# Patient Record
Sex: Female | Born: 1985 | Race: Black or African American | Hispanic: No | Marital: Single | Smoking: Never smoker
Health system: Southern US, Community
[De-identification: ages and names within clinical notes are randomized; demographics above are authoritative.]

## PROBLEM LIST (undated history)

## (undated) ENCOUNTER — Ambulatory Visit: Discharge status: Absent without leave | Payer: 59

## (undated) DIAGNOSIS — K219 Gastro-esophageal reflux disease without esophagitis: Secondary | ICD-10-CM

## (undated) DIAGNOSIS — Z8041 Family history of malignant neoplasm of ovary: Secondary | ICD-10-CM

## (undated) DIAGNOSIS — Z8042 Family history of malignant neoplasm of prostate: Secondary | ICD-10-CM

## (undated) DIAGNOSIS — Z8709 Personal history of other diseases of the respiratory system: Secondary | ICD-10-CM

## (undated) DIAGNOSIS — Z9189 Other specified personal risk factors, not elsewhere classified: Secondary | ICD-10-CM

## (undated) DIAGNOSIS — Z8679 Personal history of other diseases of the circulatory system: Secondary | ICD-10-CM

## (undated) DIAGNOSIS — Z889 Allergy status to unspecified drugs, medicaments and biological substances status: Secondary | ICD-10-CM

## (undated) DIAGNOSIS — C50919 Malignant neoplasm of unspecified site of unspecified female breast: Secondary | ICD-10-CM

## (undated) HISTORY — DX: Family history of malignant neoplasm of ovary: Z80.41

## (undated) HISTORY — DX: Allergy status to unspecified drugs, medicaments and biological substances: Z88.9

## (undated) HISTORY — DX: Other specified personal risk factors, not elsewhere classified: Z91.89

## (undated) HISTORY — DX: Family history of malignant neoplasm of prostate: Z80.42

## (undated) HISTORY — DX: Malignant neoplasm of unspecified site of unspecified female breast: C50.919

## (undated) HISTORY — DX: Personal history of other diseases of the respiratory system: Z87.09

## (undated) HISTORY — PX: REDUCTION MAMMAPLASTY: SUR839

## (undated) HISTORY — DX: Personal history of other diseases of the circulatory system: Z86.79

---

## 2016-10-29 DIAGNOSIS — J069 Acute upper respiratory infection, unspecified: Secondary | ICD-10-CM | POA: Diagnosis not present

## 2019-05-09 ENCOUNTER — Other Ambulatory Visit: Payer: Self-pay | Admitting: *Deleted

## 2019-05-09 DIAGNOSIS — Z20822 Contact with and (suspected) exposure to covid-19: Secondary | ICD-10-CM

## 2019-05-13 LAB — NOVEL CORONAVIRUS, NAA: SARS-CoV-2, NAA: NOT DETECTED

## 2020-05-13 ENCOUNTER — Other Ambulatory Visit: Payer: Self-pay | Admitting: Obstetrics and Gynecology

## 2020-05-13 DIAGNOSIS — R928 Other abnormal and inconclusive findings on diagnostic imaging of breast: Secondary | ICD-10-CM

## 2020-05-21 ENCOUNTER — Other Ambulatory Visit: Payer: Self-pay | Admitting: Obstetrics and Gynecology

## 2020-05-21 DIAGNOSIS — R928 Other abnormal and inconclusive findings on diagnostic imaging of breast: Secondary | ICD-10-CM

## 2020-05-29 ENCOUNTER — Ambulatory Visit
Admission: RE | Admit: 2020-05-29 | Discharge: 2020-05-29 | Disposition: A | Discharge status: Home / Self Care | Payer: 59 | Source: Ambulatory Visit | Attending: Obstetrics and Gynecology | Admitting: Obstetrics and Gynecology

## 2020-05-29 ENCOUNTER — Other Ambulatory Visit: Payer: Self-pay

## 2020-05-29 DIAGNOSIS — R928 Other abnormal and inconclusive findings on diagnostic imaging of breast: Secondary | ICD-10-CM

## 2020-05-30 ENCOUNTER — Telehealth: Payer: Self-pay | Admitting: Oncology

## 2020-05-30 ENCOUNTER — Encounter: Payer: Self-pay | Admitting: *Deleted

## 2020-05-30 NOTE — Telephone Encounter (Signed)
Spoke with patient to confirm morning [facility] appointment for 7/21, packet emailed to patient

## 2020-06-03 ENCOUNTER — Other Ambulatory Visit: Payer: Self-pay | Admitting: *Deleted

## 2020-06-03 DIAGNOSIS — Z171 Estrogen receptor negative status [ER-]: Secondary | ICD-10-CM | POA: Insufficient documentation

## 2020-06-03 DIAGNOSIS — C50412 Malignant neoplasm of upper-outer quadrant of left female breast: Secondary | ICD-10-CM | POA: Insufficient documentation

## 2020-06-03 DIAGNOSIS — Z170 Estrogen receptor positive status [ER+]: Secondary | ICD-10-CM

## 2020-06-03 NOTE — Progress Notes (Signed)
[name]  [phone] Fax:([phone]) [phone]     ID: WANETA FITTING DOB: Feb 10, 1986  MR#: [id number]  ION#:[id number]  Patient Care Team: [name], MD as PCP - General (Obstetrics and Gynecology) Rockwell Germany, RN as Oncology Nurse Navigator Mauro Kaufmann, RN as Oncology Nurse Navigator Erroll Luna, MD as Consulting Physician (General Surgery) Abdulrahim Siddiqi, Virgie Dad, MD as Consulting Physician (Oncology) Kyung Rudd, MD as Consulting Physician (Radiation Oncology) Servando Salina, MD as Consulting Physician (Obstetrics and Gynecology) Chauncey Cruel, MD OTHER MD:  CHIEF COMPLAINT: 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 She underwent bilateral diagnostic mammography with tomography and left breast ultrasonography at [facility] on 05/12/2020 showing: breast density category B; palpable 2.7 cm oval hypoechoic mass in left breast at 2-3 o'clock; 2.2 cm left axillary lymph node/grouping of nodes.  Accordingly on 05/29/2020 she proceeded to biopsy of the left breast area in question. The pathology from this procedure ([id number]) showed: invasive ductal carcinoma, grade 3. Prognostic indicators significant for: estrogen receptor, 10% positive with weak staining intensity and progesterone receptor, 0% negative. Proliferation marker Ki67 at 95%. HER2 equivocal by immunohistochemistry (2+), but negative by fluorescent in situ hybridization with a signals ratio 1.77 and number per cell 2.65.  Biopsy of the left axillary lymph node in question was negative and concordant  The patient's subsequent history is as detailed below.   INTERVAL HISTORY: [name] was evaluated in the multidisciplinary breast cancer clinic on 06/04/2020 accompanied by her mother Jeanne Evans. Her case was also presented at the  multidisciplinary breast cancer conference on the same day. At that time a preliminary plan was proposed: Neoadjuvant chemotherapy, definitive surgery to follow, adjuvant radiation if breast conservation, genetics testing   REVIEW OF SYSTEMS: On the provided questionnaire, Jeanne Evans reports sinus problems and palpable breast lump with associated pain. The patient denies unusual headaches, visual changes, nausea, vomiting, stiff neck, dizziness, or gait imbalance. There has been no cough, phlegm production, or pleurisy, no chest pain or pressure, and no change in bowel or bladder habits. The patient denies fever, rash, bleeding, unexplained fatigue or unexplained weight loss.  She exercises by walking and does about 4 miles daily.  A detailed review of systems was otherwise noncontributory   PAST MEDICAL HISTORY: Past Medical History:  Diagnosis Date  . Breast cancer ([place])   . History of asthma    has albuterol, uses PRN  . History of heart murmur in childhood   . History of multiple allergies   History of allergy related headaches   PAST SURGICAL HISTORY: History reviewed. No pertinent surgical history.  She reports having a boil lanced from her perineal area   FAMILY HISTORY: Family History  Problem Relation Age of Onset  . Lung cancer Maternal Uncle   . Prostate cancer Paternal Uncle   . Ovarian cancer Paternal Grandmother   . Prostate cancer Paternal Grandfather   The patient's father is 16 and her mother 84, as of 05/2020.  The patient has two brothers and one sister. She reports ovarian cancer in her paternal grandmother, prostate cancer in her paternal grandfather and a paternal uncle, and lung cancer in a maternal uncle.   GYNECOLOGIC HISTORY:  No LMP recorded. Menarche: 34 years old Age at first live birth: 34 years old [place] P 2 LMP current, experiences spotting Contraceptive: Mirena IUD in place HRT n/a  Hysterectomy?  no BSO? 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 She describes herself as single. She lives at home with her children, Jeanne Evans (age 83) and Jeanne Evans (age 91).     ADVANCED DIRECTIVES: Not in place. She intends to name her mother, Jeanne Evans, as her HCPOA.  Jeanne Evans lives in [place] and can be reached at [phone].  The patient was given the appropriate documents to complete and notarized at her discretion at the time of her visit 06/04/2020   HEALTH MAINTENANCE: Social History   Tobacco Use  . Smoking status: Never Smoker  . Smokeless tobacco: Never Used  Substance Use Topics  . Alcohol use: Yes  . Drug use: Yes     Colonoscopy: n/a (age)  PAP: 04/2020  Bone density: n/a (age)   Not on File  No current outpatient medications on file.   No current facility-administered medications for this visit.    OBJECTIVE: African-American woman who appears well  Vitals:   06/04/20 0905  BP: (!) 141/84  Pulse: 77  Resp: 20  Temp: 98.9 F (37.2 C)  SpO2: 100%     Body mass index is 27.72 kg/m.   Wt Readings from Last 3 Encounters:  06/04/20 159 lb (72.1 kg)      ECOG FS:1 - Symptomatic but completely ambulatory  Ocular: Sclerae unicteric, pupils round and equal Ear-nose-throat: Wearing a mask Lymphatic: No cervical or supraclavicular adenopathy Lungs no rales or rhonchi Heart regular rate and rhythm Abd soft, nontender, positive bowel sounds MSK no focal spinal tenderness, no joint edema Neuro: non-focal, well-oriented, appropriate affect Breasts: Right breast is unremarkable.  The mass in the upper outer quadrant of the left breast is easily palpable and measures clinically about 3 cm.  It is movable.  It is not tender.  There is no skin or nipple involvement.  Both axillae are benign.   LAB RESULTS:  CMP     Component Value Date/Time   NA 139 06/04/2020 0830   K 4.0 06/04/2020 0830   CL 107  06/04/2020 0830   CO2 25 06/04/2020 0830   GLUCOSE 97 06/04/2020 0830   BUN 13 06/04/2020 0830   CREATININE 0.92 06/04/2020 0830   CALCIUM 9.5 06/04/2020 0830   PROT 7.6 06/04/2020 0830   ALBUMIN 3.9 06/04/2020 0830   AST 13 (L) 06/04/2020 0830   ALT 14 06/04/2020 0830   ALKPHOS 61 06/04/2020 0830   BILITOT 0.5 06/04/2020 0830   GFRNONAA >60 06/04/2020 0830   GFRAA >60 06/04/2020 0830    No results found for: TOTALPROTELP, ALBUMINELP, A1GS, A2GS, BETS, BETA2SER, GAMS, MSPIKE, SPEI  Lab Results  Component Value Date   WBC 5.8 06/04/2020   NEUTROABS 2.6 06/04/2020   HGB 12.2 06/04/2020   HCT 36.5 06/04/2020   MCV 90.3 06/04/2020   PLT 290 06/04/2020    No results found for: LABCA2  No components found for: KKXFGH829  No results for input(s): INR in the last 168 hours.  No results found for: LABCA2  No results found for: HBZ169  No results found for: CVE938  No results found for: BOF751  No results found for: CA2729  No components found for: HGQUANT  No results found for: CEA1 / No results found for: CEA1   No results found for: AFPTUMOR  No results found for: CHROMOGRNA  No results found for: KPAFRELGTCHN, LAMBDASER, KAPLAMBRATIO (kappa/lambda light chains)  No  results found for: HGBA, HGBA2QUANT, HGBFQUANT, HGBSQUAN (Hemoglobinopathy evaluation)   No results found for: LDH  No results found for: IRON, TIBC, IRONPCTSAT (Iron and TIBC)  No results found for: FERRITIN  Urinalysis No results found for: COLORURINE, APPEARANCEUR, LABSPEC, PHURINE, GLUCOSEU, HGBUR, BILIRUBINUR, KETONESUR, PROTEINUR, UROBILINOGEN, NITRITE, LEUKOCYTESUR   STUDIES: [place] AXILLARY NODE CORE BIOPSY LEFT  Addendum Date: 05/30/2020   ADDENDUM REPORT: 05/30/2020 14:54 ADDENDUM: Pathology revealed GRADE III INVASIVE DUCTAL CARCINOMA of the LEFT breast, 2:30 o'clock. This was found to be concordant by Dr. Claudie Revering. Pathology revealed ONE LYMPH NODE NEGATIVE FOR CARCINOMA(0/1) of  the LEFT axilla. This was found to be concordant by Dr. Claudie Revering. Pathology results were discussed with the patient by telephone. The patient reported doing well after the biopsies with tenderness at the sites. Post biopsy instructions and care were reviewed and questions were answered. The patient was encouraged to call [facility] for any additional concerns. The patient was referred to [facility] on June 04, 2020. Pathology results reported by Stacie Acres RN on 05/30/2020. Electronically Signed   By: Claudie Revering M.D.   On: 05/30/2020 14:54   Result Date: 05/30/2020 CLINICAL DATA:  2.7 cm palpable mass in the 2:30 o'clock position of the left breast, 10 cm from the nipple, with recent imaging features suspicious for malignancy. The patient also had a left axillary lymph node with focal, eccentric cortical thickening that was suspicious for a metastatic node. EXAM: ULTRASOUND GUIDED LEFT BREAST CORE NEEDLE BIOPSY ULTRASOUND-GUIDED LEFT AXILLARY CORE NEEDLE BIOPSY COMPARISON:  Bilateral diagnostic mammogram and left breast ultrasound obtained at [facility] on 05/12/2020. PROCEDURE: I met with the patient and we discussed the procedure of ultrasound-guided biopsy, including benefits and alternatives. We discussed the high likelihood of a successful procedure. We discussed the risks of the procedures, including infection, bleeding, tissue injury, clip migration, and inadequate sampling. Informed written consent was given. The usual time-out protocol was performed immediately prior to the procedures. SITE 1: 0.7 CM MASS IN THE 2:30 O'CLOCK POSITION OF THE LEFT BREAST Lesion quadrant: Upper outer quadrant Using sterile technique and 1% Lidocaine as local anesthetic, under direct ultrasound visualization, a 12 gauge spring-loaded device was used to perform biopsy of the recently demonstrated 2.7 cm  mass in the 2:30 o'clock position of the left breast, 10 cm from the nipple, using a caudal approach. At the conclusion of the procedure a ribbon shaped tissue marker clip was deployed into the biopsy cavity. Follow up 2 view mammogram was performed and dictated separately. SITE 2: SUSPICIOUS LEFT AXILLARY LYMPH NODE Using sterile technique and 1% Lidocaine as local anesthetic, under direct ultrasound visualization, a 14 gauge spring-loaded device was used to perform biopsy of the recently demonstrated left axillary lymph node with focal, eccentric cortical thickening using an inferolateral approach. At the conclusion of the procedure a Q shaped tissue marker clip was deployed into the biopsy cavity. Follow up 2 view mammogram was performed and dictated separately. IMPRESSION: Ultrasound guided biopsy of the recently demonstrated 2.7 cm mass in the 2:30 o'clock position of the left breast in the recently demonstrated suspicious left axillary lymph node. No apparent complications. Electronically Signed: By: Claudie Revering M.D. On: 05/29/2020 14:33   MM CLIP PLACEMENT LEFT  Result Date: 05/29/2020 CLINICAL DATA:  Status post ultrasound-guided core needle biopsies of a 2.7 cm mass in the 2:30 o'clock position of the left breast  and suspicious left axillary lymph node. EXAM: DIAGNOSTIC LEFT MAMMOGRAM POST ULTRASOUND BIOPSY X 2 COMPARISON:  Previous exam(s). FINDINGS: Mammographic images were obtained following ultrasound guided biopsy of the recently demonstrated 2.7 cm mass in the 2:30 o'clock position of the left breast and recently demonstrated suspicious left axillary lymph node. The biopsy marking clips are in expected positions at the sites of biopsy. IMPRESSION: Appropriate positioning of the ribbon shaped biopsy marking clip at the site of biopsy in the biopsied mass in the 2:30 o'clock position of the left breast. The Q shaped biopsy marker clip is at the expected location of the biopsied left axillary lymph  node. Final Assessment: Post Procedure Mammograms for Marker Placement Electronically Signed   By: Claudie Revering M.D.   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 This was found to be concordant by Dr. Claudie Revering. Pathology revealed ONE LYMPH NODE NEGATIVE FOR CARCINOMA(0/1) of the LEFT axilla. This was found to be concordant by Dr. Claudie Revering. Pathology results were discussed with the patient by telephone. The patient reported doing well after the biopsies with tenderness at the sites. Post biopsy instructions and care were reviewed and questions were answered. The patient was encouraged to call [facility] for any additional concerns. The patient was referred to [facility] on June 04, 2020. Pathology results reported by Stacie Acres RN on 05/30/2020. Electronically Signed   By: Claudie Revering M.D.   On: 05/30/2020 14:54   Result Date: 05/30/2020 CLINICAL DATA:  2.7 cm palpable mass in the 2:30 o'clock position of the left breast, 10 cm from the nipple, with recent imaging features suspicious for malignancy. The patient also had a left axillary lymph node with focal, eccentric cortical thickening that was suspicious for a metastatic node. EXAM: ULTRASOUND GUIDED LEFT BREAST CORE NEEDLE BIOPSY ULTRASOUND-GUIDED LEFT AXILLARY CORE NEEDLE BIOPSY COMPARISON:  Bilateral diagnostic mammogram and left breast ultrasound obtained at [facility] on 05/12/2020. PROCEDURE: I met with the patient and we discussed the procedure of ultrasound-guided biopsy, including benefits and alternatives. We discussed the high likelihood of a successful procedure. We discussed the risks of the procedures, including infection,  bleeding, tissue injury, clip migration, and inadequate sampling. Informed written consent was given. The usual time-out protocol was performed immediately prior to the procedures. SITE 1: 0.7 CM MASS IN THE 2:30 O'CLOCK POSITION OF THE LEFT BREAST Lesion quadrant: Upper outer quadrant Using sterile technique and 1% Lidocaine as local anesthetic, under direct ultrasound visualization, a 12 gauge spring-loaded device was used to perform biopsy of the recently demonstrated 2.7 cm mass in the 2:30 o'clock position of the left breast, 10 cm from the nipple, using a caudal approach. At the conclusion of the procedure a ribbon shaped tissue marker clip was deployed into the biopsy cavity. Follow up 2 view mammogram was performed and dictated separately. SITE 2: SUSPICIOUS LEFT AXILLARY LYMPH NODE Using sterile technique and 1% Lidocaine as local anesthetic, under direct ultrasound visualization, a 14 gauge spring-loaded device was used to perform biopsy of the recently demonstrated left axillary lymph node with focal, eccentric cortical thickening using an inferolateral approach. At the conclusion of the procedure a Q shaped tissue marker clip was deployed  into the biopsy cavity. Follow up 2 view mammogram was performed and dictated separately. IMPRESSION: Ultrasound guided biopsy of the recently demonstrated 2.7 cm mass in the 2:30 o'clock position of the left breast in the recently demonstrated suspicious left axillary lymph node. No apparent complications. Electronically Signed: By: Claudie Revering M.D. On: 05/29/2020 14:33     ELIGIBLE FOR AVAILABLE RESEARCH PROTOCOL: no  ASSESSMENT: 34 y.o. [place] woman status post left breast upper outer quadrant biopsy 05/29/2020 for a clinical T2 N0, stage IIB invasive ductal carcinoma, functionally triple negative, with an MIB-1 of 95%.  (1) genetics testing in process  (2) neoadjuvant chemotherapy will consist of cyclophosphamide and doxorubicin in dose dense fashion  x4 followed by paclitaxel and carboplatin weekly x12  (3) definitive surgery to follow  (4) postmastectomy radiation as appropriate  PLAN: I met today with Jeanne Evans to review her new diagnosis. Specifically we discussed the biology of her breast cancer, its diagnosis, staging, treatment  options and prognosis.We first reviewed the fact that cancer is not one disease but more than 100 different diseases and that it is important to keep them separate-- otherwise when friends and relatives discuss their own cancer experiences with Brynnlee confusion can result. Similarly we explained that if breast cancer spreads to the bone or liver, the patient would not have bone cancer or liver cancer, but breast cancer in the bone and breast cancer in the liver: one cancer in three places-- not 3 different cancers which otherwise would have to be treated in 3 different ways.  We discussed the difference between local and systemic therapy. In terms of loco-regional treatment, lumpectomy plus radiation is equivalent to mastectomy as far as survival is concerned. For this reason, and because the cosmetic results are generally superior, we recommend breast conserving surgery followed by radiation.  We also noted that in terms of sequencing of treatments, whether systemic therapy or surgery is done first does not affect the ultimate outcome.  This is relevant to her case since we recommend starting with chemotherapy.  This will not only make the surgery easier and optimize the chances of retaining her breast with the best cosmetic results, but in addition we will give [place] information since if the patient obtains a complete pathologic response we will know that her long-term prognosis is good.  We then discussed the rationale for systemic therapy. There is some risk that this cancer may have already spread to other parts of her body.  We are going to obtain a chest CT scan and bone scan to show that there is no stage IV  disease.  Of course that does not mean the cancer is not microscopically present in other parts of her body.  Next we went over the options for systemic therapy which are anti-estrogens, anti-HER-2 immunotherapy, and chemotherapy. Rajah does not meet criteria for anti-HER-2 immunotherapy or antiestrogens.  Even though her cancer is technically weakly estrogen receptor positive, when we repeat testing on larger tissue samples postop these are invariable triple negative and certainly she cannot expect a significant response for antiestrogens given this profile.    Accordingly her only systemic treatment is chemotherapy.  We discussed the gold standard which is doxorubicin and cyclophosphamide given in dose dense fashion x4 followed by carboplatin and paclitaxel weekly x12.  We discussed some of the possible toxicities side effects and complications of these agents and she will also come and meet with our chemotherapy nurse for further discussion.  There is a possibility that she  may enter a permanent menopause although most women her age do retain fertility despite the chemotherapy.  We are hoping to be able to start treatment 06/17/2020.  Once she completes chemotherapy she will proceed to surgery and then radiation.  Kimberlyn has a good understanding of the overall plan. She agrees with it. She knows the goal of treatment in her case is cure. She will call with any problems that may develop before her next visit here.  Total encounter time 70 minutes.Sarajane Jews C. Kasra Melvin, MD 06/04/2020 10:56 AM Medical Oncology and Hematology [facility] [place], [place] [address] Tel. [phone]    Fax. [phone]   This document serves as a record of services personally performed by Lurline Del, MD. It was created on his behalf by Wilburn Mylar, a trained medical scribe. The creation of this record is based on the scribe's personal observations and the provider's  statements to them.   I, Lurline Del MD, have reviewed the above documentation for accuracy and completeness, and I agree with the above.    *Total Encounter Time as defined by the Centers for Medicare and Medicaid Services includes, in addition to the face-to-face time of a patient visit (documented in the note above) non-face-to-face time: obtaining and reviewing outside history, ordering and reviewing medications, tests or procedures, care coordination (communications with other health care professionals or caregivers) and documentation in the medical record.

## 2020-06-04 ENCOUNTER — Other Ambulatory Visit: Payer: Self-pay

## 2020-06-04 ENCOUNTER — Other Ambulatory Visit: Payer: Self-pay | Admitting: *Deleted

## 2020-06-04 ENCOUNTER — Inpatient Hospital Stay (HOSPITAL_BASED_OUTPATIENT_CLINIC_OR_DEPARTMENT_OTHER): Payer: 59 | Admitting: Genetic Counselor

## 2020-06-04 ENCOUNTER — Encounter: Payer: Self-pay | Admitting: Oncology

## 2020-06-04 ENCOUNTER — Encounter: Payer: Self-pay | Admitting: Genetic Counselor

## 2020-06-04 ENCOUNTER — Ambulatory Visit: Payer: 59 | Attending: Surgery | Admitting: Physical Therapy

## 2020-06-04 ENCOUNTER — Inpatient Hospital Stay: Payer: 59

## 2020-06-04 ENCOUNTER — Ambulatory Visit: Payer: Self-pay | Admitting: Surgery

## 2020-06-04 ENCOUNTER — Encounter: Payer: Self-pay | Admitting: Licensed Clinical Social Worker

## 2020-06-04 ENCOUNTER — Encounter: Payer: Self-pay | Admitting: *Deleted

## 2020-06-04 ENCOUNTER — Encounter: Payer: Self-pay | Admitting: Physical Therapy

## 2020-06-04 ENCOUNTER — Ambulatory Visit
Admission: RE | Admit: 2020-06-04 | Discharge: 2020-06-04 | Disposition: A | Discharge status: Home / Self Care | Payer: 59 | Source: Ambulatory Visit | Attending: Radiation Oncology | Admitting: Radiation Oncology

## 2020-06-04 ENCOUNTER — Ambulatory Visit: Payer: 59 | Admitting: Radiation Oncology

## 2020-06-04 ENCOUNTER — Inpatient Hospital Stay: Payer: 59 | Attending: Oncology | Admitting: Oncology

## 2020-06-04 VITALS — BP 141/84 | HR 77 | Temp 98.9°F | Resp 20 | Ht 63.5 in | Wt 159.0 lb

## 2020-06-04 DIAGNOSIS — Z793 Long term (current) use of hormonal contraceptives: Secondary | ICD-10-CM | POA: Diagnosis not present

## 2020-06-04 DIAGNOSIS — Z8042 Family history of malignant neoplasm of prostate: Secondary | ICD-10-CM | POA: Insufficient documentation

## 2020-06-04 DIAGNOSIS — Z170 Estrogen receptor positive status [ER+]: Secondary | ICD-10-CM | POA: Insufficient documentation

## 2020-06-04 DIAGNOSIS — R293 Abnormal posture: Secondary | ICD-10-CM | POA: Insufficient documentation

## 2020-06-04 DIAGNOSIS — Z8041 Family history of malignant neoplasm of ovary: Secondary | ICD-10-CM | POA: Insufficient documentation

## 2020-06-04 DIAGNOSIS — C50412 Malignant neoplasm of upper-outer quadrant of left female breast: Secondary | ICD-10-CM

## 2020-06-04 DIAGNOSIS — Z975 Presence of (intrauterine) contraceptive device: Secondary | ICD-10-CM | POA: Diagnosis not present

## 2020-06-04 DIAGNOSIS — Z171 Estrogen receptor negative status [ER-]: Secondary | ICD-10-CM | POA: Diagnosis not present

## 2020-06-04 DIAGNOSIS — Z7951 Long term (current) use of inhaled steroids: Secondary | ICD-10-CM

## 2020-06-04 DIAGNOSIS — J45909 Unspecified asthma, uncomplicated: Secondary | ICD-10-CM | POA: Diagnosis not present

## 2020-06-04 DIAGNOSIS — Z801 Family history of malignant neoplasm of trachea, bronchus and lung: Secondary | ICD-10-CM | POA: Diagnosis not present

## 2020-06-04 LAB — CMP (CANCER CENTER ONLY)
ALT: 14 U/L (ref 0–44)
AST: 13 U/L — ABNORMAL LOW (ref 15–41)
Albumin: 3.9 g/dL (ref 3.5–5.0)
Alkaline Phosphatase: 61 U/L (ref 38–126)
Anion gap: 7 (ref 5–15)
BUN: 13 mg/dL (ref 6–20)
CO2: 25 mmol/L (ref 22–32)
Calcium: 9.5 mg/dL (ref 8.9–10.3)
Chloride: 107 mmol/L (ref 98–111)
Creatinine: 0.92 mg/dL (ref 0.44–1.00)
GFR, Est AFR Am: >60 mL/min (ref >60)
GFR, Estimated: >60 mL/min (ref >60)
Glucose, Bld: 97 mg/dL (ref 70–99)
Potassium: 4 mmol/L (ref 3.5–5.1)
Sodium: 139 mmol/L (ref 135–145)
Total Bilirubin: 0.5 mg/dL (ref 0.3–1.2)
Total Protein: 7.6 g/dL (ref 6.5–8.1)

## 2020-06-04 LAB — CBC WITH DIFFERENTIAL (CANCER CENTER ONLY)
Abs Immature Granulocytes: 0.01 10*3/uL (ref 0.00–0.07)
Basophils Absolute: 0 10*3/uL (ref 0.0–0.1)
Basophils Relative: 0 %
Eosinophils Absolute: 0.2 10*3/uL (ref 0.0–0.5)
Eosinophils Relative: 4 %
HCT: 36.5 % (ref 36.0–46.0)
Hemoglobin: 12.2 g/dL (ref 12.0–15.0)
Immature Granulocytes: 0 %
Lymphocytes Relative: 42 %
Lymphs Abs: 2.5 10*3/uL (ref 0.7–4.0)
MCH: 30.2 pg (ref 26.0–34.0)
MCHC: 33.4 g/dL (ref 30.0–36.0)
MCV: 90.3 fL (ref 80.0–100.0)
Monocytes Absolute: 0.5 10*3/uL (ref 0.1–1.0)
Monocytes Relative: 9 %
Neutro Abs: 2.6 10*3/uL (ref 1.7–7.7)
Neutrophils Relative %: 45 %
Platelet Count: 290 10*3/uL (ref 150–400)
RBC: 4.04 MIL/uL (ref 3.87–5.11)
RDW: 14.4 % (ref 11.5–15.5)
WBC Count: 5.8 10*3/uL (ref 4.0–10.5)
nRBC: 0 % (ref 0.0–0.2)

## 2020-06-04 LAB — GENETIC SCREENING ORDER

## 2020-06-04 MED ORDER — LORAZEPAM 0.5 MG PO TABS: 0.5000 mg | ORAL_TABLET | Freq: Every evening | ORAL | 0 refills | Status: DC | PRN

## 2020-06-04 MED ORDER — LIDOCAINE-PRILOCAINE 2.5-2.5 % EX CREA: TOPICAL_CREAM | CUTANEOUS | 3 refills | Status: DC

## 2020-06-04 MED ORDER — DEXAMETHASONE 4 MG PO TABS: ORAL_TABLET | ORAL | 1 refills | Status: DC

## 2020-06-04 MED ORDER — PROCHLORPERAZINE MALEATE 10 MG PO TABS: 10.0000 mg | ORAL_TABLET | Freq: Four times a day (QID) | ORAL | 1 refills | Status: DC | PRN

## 2020-06-04 NOTE — Progress Notes (Signed)
Radiation Oncology         ([phone]) [phone] ________________________________  Name: Jeanne Evans        MRN: [id number]  Date of Service: 06/04/2020 DOB: 07-14-1986  LX:BWIO, Rogelio Seen, MD  Erroll Luna, MD     REFERRING PHYSICIAN: Erroll Luna, MD   DIAGNOSIS: The encounter diagnosis was Malignant neoplasm of upper-outer quadrant of left breast in female, estrogen receptor positive ([place]).   HISTORY OF PRESENT ILLNESS: Jeanne Evans is a 34 y.o. female seen in the multidisciplinary breast clinic for a new diagnosis of left breast cancer. The patient was noted to have a painful mass in the left breast. This prompted further diagnostic imaging of the breast which revealed a mass at 2:30 and this measured 2.7 x 2.2 x 2.1 cm, and an axillary node in the left axilla was enlarged measuring 2.2 x 1.5 x .7 cm. 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 Her node was negative and felt to be concordant. She is seen today to discuss options of treatment of her cancer.    PREVIOUS RADIATION THERAPY: No   PAST MEDICAL HISTORY:  Past Medical History:  Diagnosis Date  . Breast cancer ([place])   . History of asthma    has albuterol, uses PRN  . History of heart murmur in childhood   . History of multiple allergies        PAST SURGICAL HISTORY:No past surgical history on file.    FAMILY HISTORY:  Family History  Problem Relation Age of Onset  . Lung cancer Maternal Uncle   . Prostate cancer Paternal Uncle   . Ovarian cancer Paternal Grandmother   . Prostate cancer Paternal Grandfather      SOCIAL HISTORY:  reports that she has never smoked. She has never used smokeless tobacco. She reports current alcohol use. She reports current drug use. The patient is single and lives in  [place]. 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 She has teenage daughters.   ALLERGIES: Patient has no allergy information on record.   MEDICATIONS:  No current outpatient medications on file.   No current facility-administered medications for this encounter.     REVIEW OF SYSTEMS: On review of systems, the patient reports that she is doing well overall. She denies any chest pain, shortness of breath, cough, fevers, chills, night sweats, unintended weight changes. She denies any bowel or bladder disturbances, and denies abdominal pain, nausea or vomiting. She denies any new musculoskeletal or joint aches or pains. A complete review of systems is obtained and is otherwise negative.     PHYSICAL EXAM:  Wt Readings from Last 3 Encounters:  06/04/20 159 lb (72.1 kg)   Temp Readings from Last 3 Encounters:  06/04/20 98.9 F (37.2 C) (Temporal)   BP Readings from Last 3 Encounters:  06/04/20 (!) 141/84   Pulse Readings from Last 3 Encounters:  06/04/20 77    In general this is a well appearing African American female in no acute distress. She's alert and oriented x4 and appropriate throughout the examination. Cardiopulmonary assessment is negative for acute distress and she exhibits normal effort. Bilateral breast exam is deferred.    ECOG = 1  0 - Asymptomatic (Fully active, able to carry on all  predisease activities without restriction)  1 - Symptomatic but completely ambulatory (Restricted in physically strenuous activity but ambulatory and able to carry out work of a light or sedentary nature. For example, light housework, office work)  2 - Symptomatic, <50% in bed during the day (Ambulatory and capable of all self care but unable to carry out any work activities. Up and about more than 50% of waking hours)  3 - Symptomatic, >50% in bed, but not bedbound (Capable of only limited self-care, confined to bed or chair 50% or more of waking hours)  4 -  Bedbound (Completely disabled. Cannot carry on any self-care. Totally confined to bed or chair)  5 - Death   Eustace Pen MM, Creech RH, Tormey DC, et al. [phone]). "Toxicity and response criteria of the [facility] Group". [place] Oncol. 5 (6): 649-55    LABORATORY DATA:  Lab Results  Component Value Date   WBC 5.8 06/04/2020   HGB 12.2 06/04/2020   HCT 36.5 06/04/2020   MCV 90.3 06/04/2020   PLT 290 06/04/2020   Lab Results  Component Value Date   NA 139 06/04/2020   K 4.0 06/04/2020   CL 107 06/04/2020   CO2 25 06/04/2020   Lab Results  Component Value Date   ALT 14 06/04/2020   AST 13 (L) 06/04/2020   ALKPHOS 61 06/04/2020   BILITOT 0.5 06/04/2020      RADIOGRAPHY: [place] AXILLARY NODE CORE BIOPSY LEFT  Addendum Date: 05/30/2020   ADDENDUM REPORT: 05/30/2020 14:54 ADDENDUM: Pathology revealed GRADE III INVASIVE DUCTAL CARCINOMA of the LEFT breast, 2:30 o'clock. This was found to be concordant by Dr. Claudie Revering. Pathology revealed ONE LYMPH NODE NEGATIVE FOR CARCINOMA(0/1) of the LEFT axilla. This was found to be concordant by Dr. Claudie Revering. Pathology results were discussed with the patient by telephone. The patient reported doing well after the biopsies with tenderness at the sites. Post biopsy instructions and care were reviewed and questions were answered. The patient was encouraged to call [facility] for any additional concerns. The patient was referred to [facility] on June 04, 2020. Pathology results reported by Stacie Acres RN on 05/30/2020. Electronically Signed   By: Claudie Revering M.D.   On: 05/30/2020 14:54   Result Date: 05/30/2020 CLINICAL DATA:  2.7 cm palpable mass in the 2:30 o'clock position of the left breast, 10 cm from the nipple, with recent imaging features suspicious for malignancy. The patient also had a left axillary lymph node with focal,  eccentric cortical thickening that was suspicious for a metastatic node. EXAM: ULTRASOUND GUIDED LEFT BREAST CORE NEEDLE BIOPSY ULTRASOUND-GUIDED LEFT AXILLARY CORE NEEDLE BIOPSY COMPARISON:  Bilateral diagnostic mammogram and left breast ultrasound obtained at [facility] on 05/12/2020. PROCEDURE: I met with the patient and we discussed the procedure of ultrasound-guided biopsy, including benefits and alternatives. We discussed the high likelihood of a successful procedure. We discussed the risks of the procedures, including infection, bleeding, tissue injury, clip migration, and inadequate sampling. Informed written consent was given. The usual time-out protocol was performed immediately prior to the procedures. SITE 1: 0.7 CM MASS IN THE 2:30 O'CLOCK POSITION OF THE LEFT BREAST Lesion quadrant: Upper outer quadrant Using sterile technique and 1% Lidocaine as local anesthetic, under direct ultrasound visualization, a 12 gauge spring-loaded device was used to perform biopsy of the recently demonstrated 2.7 cm mass in the 2:30 o'clock position  of the left breast, 10 cm from the nipple, using a caudal approach. At the conclusion of the procedure a ribbon shaped tissue marker clip was deployed into the biopsy cavity. Follow up 2 view mammogram was performed and dictated separately. SITE 2: SUSPICIOUS LEFT AXILLARY LYMPH NODE Using sterile technique and 1% Lidocaine as local anesthetic, under direct ultrasound visualization, a 14 gauge spring-loaded device was used to perform biopsy of the recently demonstrated left axillary lymph node with focal, eccentric cortical thickening using an inferolateral approach. At the conclusion of the procedure a Q shaped tissue marker clip was deployed into the biopsy cavity. Follow up 2 view mammogram was performed and dictated separately. IMPRESSION: Ultrasound guided biopsy of the recently demonstrated 2.7 cm mass in the 2:30 o'clock position of the left breast in the  recently demonstrated suspicious left axillary lymph node. No apparent complications. Electronically Signed: By: Claudie Revering M.D. On: 05/29/2020 14:33   MM CLIP PLACEMENT LEFT  Result Date: 05/29/2020 CLINICAL DATA:  Status post ultrasound-guided core needle biopsies of a 2.7 cm mass in the 2:30 o'clock position of the left breast and suspicious left axillary lymph node. EXAM: DIAGNOSTIC LEFT MAMMOGRAM POST ULTRASOUND BIOPSY X 2 COMPARISON:  Previous exam(s). FINDINGS: Mammographic images were obtained following ultrasound guided biopsy of the recently demonstrated 2.7 cm mass in the 2:30 o'clock position of the left breast and recently demonstrated suspicious left axillary lymph node. The biopsy marking clips are in expected positions at the sites of biopsy. IMPRESSION: Appropriate positioning of the ribbon shaped biopsy marking clip at the site of biopsy in the biopsied mass in the 2:30 o'clock position of the left breast. The Q shaped biopsy marker clip is at the expected location of the biopsied left axillary lymph node. Final Assessment: Post Procedure Mammograms for Marker Placement Electronically Signed   By: Claudie Revering M.D.   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 This was found to be concordant by Dr. Claudie Revering. Pathology revealed ONE LYMPH NODE NEGATIVE FOR CARCINOMA(0/1) of the LEFT axilla. This was found to be concordant by Dr. Claudie Revering. Pathology results were discussed with the patient by telephone. The patient reported doing well after the biopsies with tenderness at the sites. Post biopsy instructions and care were reviewed and questions were answered. The patient was encouraged to call [facility] for any additional concerns. The patient was referred to [facility] on June 04, 2020. Pathology results reported by Stacie Acres RN on 05/30/2020. Electronically Signed   By: Claudie Revering M.D.   On: 05/30/2020 14:54   Result Date: 05/30/2020 CLINICAL DATA:  2.7 cm palpable mass in the 2:30 o'clock position of the left breast, 10 cm from the nipple, with recent imaging features suspicious for malignancy. The patient also had a left axillary lymph node with focal, eccentric cortical thickening that was suspicious for a metastatic node. EXAM: ULTRASOUND GUIDED LEFT BREAST CORE NEEDLE BIOPSY ULTRASOUND-GUIDED LEFT AXILLARY CORE NEEDLE BIOPSY COMPARISON:  Bilateral diagnostic mammogram and left breast ultrasound obtained at [facility] on 05/12/2020. PROCEDURE: I met with the patient and we discussed the procedure of ultrasound-guided biopsy, including benefits and alternatives. We discussed the high likelihood of a  successful procedure. We discussed the risks of the procedures, including infection, bleeding, tissue injury, clip migration, and inadequate sampling. Informed written consent was given. The usual time-out protocol was performed immediately prior to the procedures. SITE 1: 0.7 CM MASS IN THE 2:30 O'CLOCK POSITION OF THE LEFT BREAST Lesion quadrant: Upper outer quadrant Using sterile technique and 1% Lidocaine as local anesthetic, under direct ultrasound visualization, a 12 gauge spring-loaded device was used to perform biopsy of the recently demonstrated 2.7 cm mass in the 2:30 o'clock position of the left breast, 10 cm from the nipple, using a caudal approach. At the conclusion of the procedure a ribbon shaped tissue marker clip was deployed into the biopsy cavity. Follow up 2 view mammogram was performed and dictated separately. SITE 2: SUSPICIOUS LEFT AXILLARY LYMPH NODE Using sterile technique and 1% Lidocaine as local anesthetic, under direct ultrasound visualization, a 14  gauge spring-loaded device was used to perform biopsy of the recently demonstrated left axillary lymph node with focal, eccentric cortical thickening using an inferolateral approach. At the conclusion of the procedure a Q shaped tissue marker clip was deployed into the biopsy cavity. Follow up 2 view mammogram was performed and dictated separately. IMPRESSION: Ultrasound guided biopsy of the recently demonstrated 2.7 cm mass in the 2:30 o'clock position of the left breast in the recently demonstrated suspicious left axillary lymph node. No apparent complications. Electronically Signed: By: Claudie Revering M.D. On: 05/29/2020 14:33       IMPRESSION/PLAN: 1. 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 Dr. Lisbeth Renshaw discusses the pathology findings and reviews the nature of left breast disease. The consensus from the breast conference includes proceeding with neoadjuvant chemotherapy, with surgery to be determined. She is aware of the role for external radiotherapy to the breast if she had lumpectomy, or if she had node positive disease. We discussed the risks, benefits, short, and long term effects of radiotherapy, and the patient is interested in proceeding if indicated. Dr. Lisbeth Renshaw discusses the delivery and logistics of radiotherapy and anticipates a course of 6 1/2 weeks of radiotherapy with deep inspiration breath hold technique. We will see her back about 2 weeks after surgery if she chose breast conservation or if she had node positive disease to discuss the simulation process and anticipate we starting radiotherapy about 4-6 weeks after surgery.  2. Contraceptive Counseling. The patient is using a Mirena IUD for contraception and is amenorrheic. She is counseled that if she is not sexually active we would not need to perform a urine hcg, however if she is sexually active prior to radiotherapy we would want to have  a urine hcg. 3. Possible genetic predisposition to malignancy. The patient is a candidate for genetic testing given her personal  history. She was offered referral and will met with genetic testing today.   In a visit lasting 60 minutes, greater than 50% of the time was spent face to face reviewing her case, as well as in preparation of, discussing, and coordinating the patient's care.  The above documentation reflects my direct findings during this shared patient visit. Please see the separate note by Dr. Lisbeth Renshaw on this date for the remainder of the patient's plan of care.    Carola Rhine, PAC

## 2020-06-04 NOTE — Progress Notes (Signed)
Clinical Social Work [place] Psychosocial Distress Screening [place]   Patient completed distress screening protocol and scored a 4 on the Psychosocial Distress Thermometer which indicates mild distress. Clinical Social Worker met with patient and patient's Mother, [name], in [facility] to assess for distress and other psychosocial needs.  Patient stated she was feeling nervous but felt better after meeting with the treatment team and getting more information on her treatment plan.  She has been relying on her faith and trust in God to help cope through diagnosis.  CSW and patient discussed common feeling and emotions when being diagnosed with cancer, and the importance of support during treatment.    Barriers to care/review of distress screen:  - Transportation: Can drive herself and has some supports who may help as needed.   - Help at home: Lives with her two kids (De'Lisha, 13yo; De'Arah 12yo).    - Support system: Has siblings, aunts, uncles, mom, and church community who are supports for her.   - Finances: No major concerns currently. Works for [employer] and will be able to access benefits like FMLA      Distress Screen: [employer] DISTRESS SCREENING 06/04/2020  Screening Type Initial Screening  Distress experienced in past week (1-10) 4  Emotional problem type Nervousness/Anxiety  Physical Problem type Pain     Clinical Social Worker follow up needed: No.  CSW informed patient of the support team and support services at [facility].  CSW provided contact information and encouraged patient to call with any questions or concerns.   [place]

## 2020-06-04 NOTE — Progress Notes (Signed)
REFERRING PROVIDER: Chauncey Cruel, MD [address] [place],  [place] [address]  PRIMARY PROVIDER:  [name], MD  PRIMARY REASON FOR VISIT:  1. Malignant neoplasm of upper-outer quadrant of left breast in female, estrogen receptor positive ([place])   2. Family history of ovarian cancer   3. Family history of prostate cancer    HISTORY OF PRESENT ILLNESS:   Jeanne Evans, a 34 y.o. female, was seen for a [place] cancer genetics consultation at the request of Dr. Jana Hakim due to a personal history of breast cancer and a family history of ovarian and prostate cancers.  Jeanne Evans presents to clinic today to discuss the possibility of a hereditary predisposition to cancer, genetic testing, and to further clarify her future cancer risks, as well as potential cancer risks for family members.   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 The treatment plan includes neoadjuvant chemotherapy, surgery, and adjuvant radiation if breast conservation surgery performed.   RISK FACTORS:  Menarche was at age 39.  First live birth at age 12.  OCP use for approximately 10 years.  Ovaries intact: yes.  Hysterectomy: no. Menopausal status: premenopausal.  HRT use: 0 years. Colonoscopy: no; not examined. Mammogram within the last year: yes. Up to date with pelvic exams: yes. Any excessive radiation exposure in the past: no  Past Medical History:  Diagnosis Date  . Breast cancer ([place])   . Family history of ovarian cancer   . Family history of prostate cancer   . History of asthma    has albuterol, uses PRN  . History of heart murmur in childhood   . History of multiple allergies     No past surgical history on file.  Social History   Socioeconomic History  . Marital status: Unknown    Spouse name: Not on file  . Number of children: Not on file  . Years of education: Not on  file  . Highest education level: Not on file  Occupational History  . Not on file  Tobacco Use  . Smoking status: Never Smoker  . Smokeless tobacco: Never Used  Substance and Sexual Activity  . Alcohol use: Yes  . Drug use: Yes  . Sexual activity: Not on file  Other Topics Concern  . Not on file  Social History Narrative  . Not on file   Social Determinants of Health   Financial Resource Strain:   . Difficulty of Paying Living Expenses:   Food Insecurity:   . Worried About Charity fundraiser in the Last Year:   . Arboriculturist in the Last Year:   Transportation Needs:   . Film/video editor (Medical):   Marland Kitchen Lack of Transportation (Non-Medical):   Physical Activity:   . Days of Exercise per Week:   . Minutes of Exercise per Session:   Stress:   . Feeling of Stress :   Social Connections:   . Frequency of Communication with Friends and Family:   . Frequency of Social Gatherings with Friends and Family:   . Attends Religious Services:   . Active Member of Clubs or Organizations:   . Attends Archivist Meetings:   Marland Kitchen Marital Status:      FAMILY HISTORY:  We obtained a detailed, 4-generation family history.  Significant diagnoses are listed below: Family History  Problem Relation Age of Onset  . Lung cancer  Maternal Uncle        dx late 49s  . Prostate cancer Paternal Uncle        dx late 19s  . Ovarian cancer Paternal Grandmother        dx 7s  . Prostate cancer Paternal Grandfather        dx 3s  . Prostate cancer Paternal Uncle        dx early 50s        Jeanne Evans's father is living at age 76. Jeanne Evans has two paternal uncles diagnosed with prostate cancer in their late 72s and early 63s.  These maternal uncles are living at age 50 and 74.  Jeanne Evans. Beske does not believe that their prostate cancers were metastatic.  Jeanne Evans paternal grandfather was also diagnosed with prostate cancer in his 11s and passed away at 102.  Jeanne Evans paternal  grandmother was diagnosed with ovarian cancer in her 4s and passed away in her 57s.    Jeanne Evans mother is living at age 60.  Jeanne Evans has one maternal uncle who was diagnosed with lung cancer in his late 44s and passed away in his late 4s.  Jeanne Evans maternal grandfather passed away at 59 and maternal grandmother passed away at 84 from a hert attack.  No other family history of cancer was reported.   Jeanne Evans is unaware of previous family history of genetic testing for hereditary cancer risks. Patient's maternal ancestors are of [place] American and [place] ancestry, and paternal ancestors are of [place] American ancestry. There is no reported Ashkenazi Jewish ancestry. There is no known consanguinity.  GENETIC COUNSELING ASSESSMENT: Jeanne Evans is a 34 y.o. female with a personal and family history of cancer which is somewhat suggestive of a hereditary cancer syndrome and predisposition to cancer given her age of diagnosis and her family history of related cancers. We, therefore, discussed and recommended the following at today's visit.   DISCUSSION: We discussed that 5 - 10% of cancer is hereditary, with most cases of hereditary breast cancer associated with mutations in BRCA1 and BRCA2.  There are other genes that can be associated with hereditary breast cancer syndromes.  These include but are not limited to [place], ATM, and CHEK2.  We discussed that testing is beneficial for several reasons including knowing how to follow individuals after completing their treatment, identifying whether potential surgical options would be beneficial, and understand if other family members could be at risk for cancer and allow them to undergo genetic testing.   We reviewed the characteristics, features and inheritance patterns of hereditary cancer syndromes. We also discussed genetic testing, including the appropriate family members to test, the process of testing, insurance coverage and turn-around-time  for results. We discussed the implications of a negative, positive, carrier and/or variant of uncertain significant result. We recommended Jeanne Evans pursue genetic testing for a panel that includes genes associated with breast and ovarian cancers.   Jeanne Evans  was offered a common hereditary cancer panel (48 genes) and an expanded pan-cancer panel (85 genes). Jeanne Evans was informed of the benefits and limitations of each panel, including that expanded pan-cancer panels contain several preliminary evidence genes that do not have clear management guidelines at this point in time.  We also discussed that as the number of genes included on a panel increases, the chances of variants of uncertain significance increases.  After considering the benefits and limitations of each gene panel, Jeanne Evans  elected to have expanded  pan-cancer panel through [facility].  The Multi-Cancer Panel offered by [facility] includes sequencing and/or deletion duplication testing of the following 85 genes: AIP, ALK, APC, ATM, AXIN2,BAP1,  BARD1, BLM, BMPR1A, BRCA1, BRCA2, BRIP1, CASR, CDC73, CDH1, CDK4, CDKN1B, CDKN1C, CDKN2A (p14ARF), CDKN2A (p16INK4a), CEBPA, CHEK2, CTNNA1, DICER1, DIS3L2, EGFR (c.2369C>T, p.Thr790Met variant only), EPCAM (Deletion/duplication testing only), FH, FLCN, GATA2, GPC3, GREM1 (Promoter region deletion/duplication testing only), HOXB13 (c.251G>A, p.Gly84Glu), HRAS, KIT, MAX, MEN1, MET, MITF (c.952G>A, p.Glu318Lys variant only), MLH1, MSH2, MSH3, MSH6, MUTYH, NBN, NF1, NF2, NTHL1, PALB2, PDGFRA, PHOX2B, PMS2, POLD1, POLE, POT1, PRKAR1A, PTCH1, PTEN, RAD50, RAD51C, RAD51D, RB1, RECQL4, RET, RNF43, RUNX1, SDHAF2, SDHA (sequence changes only), SDHB, SDHC, SDHD, SMAD4, SMARCA4, SMARCB1, SMARCE1, STK11, SUFU, TERC, TERT, TMEM127, TP53, TSC1, TSC2, VHL, WRN and WT1.   Based on Jeanne Evans's personal and family history of cancer, she meets medical criteria for genetic testing. Despite that she meets criteria, she may  still have an out of pocket cost. We discussed that if her out of pocket cost for testing is over $100, the laboratory will call and confirm whether she wants to proceed with testing.  If the out of pocket cost of testing is less than $100 she will be billed by the genetic testing laboratory.   PLAN: After considering the risks, benefits, and limitations, Jeanne Evans provided informed consent to pursue genetic testing and the blood sample was sent to [facility] for analysis of the Multi-Cancer Panel. Results should be available within approximately 3 weeks' time, at which point they will be disclosed by telephone to Jeanne Evans, as will any additional recommendations warranted by these results. Jeanne Evans. [place] will receive a summary of her genetic counseling visit and a copy of her results once available. This information will also be available in Epic.   Lastly, we encouraged Jeanne Evans. [place] to remain in contact with cancer genetics annually so that we can continuously update the family history and inform her of any changes in cancer genetics and testing that may be of benefit for this family.   Jeanne Evans. [place] questions were answered to her satisfaction today. Our contact information was provided should additional questions or concerns arise. Thank you for the referral and allowing [place] to share in the care of your patient.   Elijha Dedman M. Joette Catching, [email or web address] (P) [phone]  The patient was seen for a total of 25 minutes in face-to-face genetic counseling.  This patient was discussed with Drs. Magrinat, Lindi Adie and/or Burr Medico who agrees with the above.   _______________________________________________________________________ For Office Staff:  Number of people involved in session: 1 Was an Intern/ student involved with case: no

## 2020-06-04 NOTE — Patient Instructions (Signed)

## 2020-06-04 NOTE — H&P (View-Only) (Signed)
[name] Documented: 06/04/2020 8:56 AM Location: [facility] Patient #: [id number] DOB: [birth year] Undefined / Language: [name] / Race: Black or African American Female  History of Present Illness [name] A. Chelisa Hennen MD; 06/04/2020 10:21 AM) Patient words: Pt sent at the request of Dr Lisbeth Renshaw for left breast mass that is painful for 1 month. Mammogram/ U/S shows 2.7 cm UOQ left breast mass ER weak positive PR negative her 2 neu negative ki 67 95% No family history of breast cancer No other complaints. Pain is worse after biopsy.  The patient is a 34 year old female.   Allergies [name], [place]; 06/04/2020 8:59 AM) No Known Drug Allergies [06/02/2020]: Allergies Reconciled  Medication History [name], [place]; 06/04/2020 8:59 AM) Albuterol Sulfate HFA (108 (90 Base)MCG/ACT Aerosol Soln, Inhalation) Active. No Current Medications (Taken starting 06/04/2020) Amoxicillin (500MG  Capsule, Oral) Active. metroNIDAZOLE (500MG  Tablet, Oral) Active. Ondansetron (4MG  Tablet Disint, Oral) Active. valACYclovir HCl (500MG  Tablet, Oral) Active. valACYclovir HCl (1GM Tablet, Oral) Active. predniSONE (20MG  Tablet, Oral) Active.     Review of Systems (Mekhi Sonn A. Denaisha Swango MD; 06/04/2020 10:21 AM) All other systems negative   Physical Exam (Jacobe Study A. Nakiyah Beverley MD; 06/04/2020 10:22 AM)  Head and Neck Head-normocephalic, atraumatic with no lesions or palpable masses.  Eye Eyeball - Bilateral-Extraocular movements intact. Sclera/Conjunctiva - Bilateral-No scleral icterus.  Chest and Lung Exam Chest and lung exam reveals -quiet, even and easy respiratory effort with no use of accessory muscles and on auscultation, normal breath sounds, no adventitious sounds and normal vocal resonance. Inspection Chest Wall - Normal. Back - normal.  Breast Note: left breast mass 3 cm mobile tender LUOQ right breast normal  Cardiovascular Cardiovascular examination  reveals -on palpation PMI is normal in location and amplitude, no palpable S3 or S4. Normal cardiac borders., normal heart sounds, regular rate and rhythm with no murmurs, carotid auscultation reveals no bruits and normal pedal pulses bilaterally.  Neurologic Neurologic evaluation reveals -alert and oriented x 3 with no impairment of recent or remote memory. Mental Status-Normal.  Musculoskeletal Normal Exam - Left-Upper Extremity Strength Normal and Lower Extremity Strength Normal. Normal Exam - Right-Upper Extremity Strength Normal, Lower Extremity Weakness.    Assessment & Plan (Sibyl Mikula A. Zoeann Mol MD; 06/04/2020 10:27 AM)  BREAST CANCER, STAGE 2, LEFT (C50.912) Impression: neoadjuvant chemotherapy breast conservation down the road MRI schedule for port Pt requires port placement for chemotherapy. Risk include bleeding, infection, pneumothorax, hemothorax, mediastinal injury, nerve injury , blood vessel injury, stroke, blood clots, death, migration. embolization and need for additional procedures. Pt agrees to proceed.  Current Plans You are being scheduled for surgery- Our schedulers will call you.  You should hear from our office's scheduling department within 5 working days about the location, date, and time of surgery. We try to make accommodations for patient's preferences in scheduling surgery, but sometimes the OR schedule or the surgeon's schedule prevents [place] from making those accommodations.  If you have not heard from our office [phone]) in 5 working days, call the office and ask for your surgeon's nurse.  If you have other questions about your diagnosis, plan, or surgery, call the office and ask for your surgeon's nurse.  Use of a central venous catheter for intravenous therapy was discussed. Technique of catheter placement using ultrasound and fluoroscopy guidance was discussed. Risks such as bleeding, infection, pneumothorax, catheter occlusion,  reoperation, and other risks were discussed. I noted a good likelihood this will help address the problem. Questions were answered. The patient  expressed understanding & wishes to proceed.

## 2020-06-04 NOTE — Therapy (Signed)
[place], [place], [address] Phone: [phone]   Fax:  [phone]  Physical Therapy Evaluation  Patient Details  Name: Jeanne Evans MRN: [id number] Date of Birth: 07/07/1986 Referring Provider (PT): Dr. Erroll Luna   Encounter Date: 06/04/2020   PT End of Session - 06/04/20 1254    Visit Number 1    Number of Visits 2    Date for PT Re-Evaluation 07/30/20    PT Start Time 1125    PT Stop Time 1155    PT Time Calculation (min) 30 min    Activity Tolerance Patient tolerated treatment well    Behavior During Therapy [facility] for tasks assessed/performed           Past Medical History:  Diagnosis Date  . Breast cancer ([place])   . History of asthma    has albuterol, uses PRN  . History of heart murmur in childhood   . History of multiple allergies     History reviewed. No pertinent surgical history.  There were no vitals filed for this visit.    Subjective Assessment - 06/04/20 1243    Subjective Patient reports she is here today to be seen by her medical team for her newly diagnosed left breast cancer.    Patient is accompained by: Family member    Pertinent History Patient was diagnosed on 05/12/2020 with left breast cancer. It measures 2.7 cm and is located in hte upper outer quadrant. 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    Patient Stated Goals Reduce lymphedema risk and learn post op shoulder ROM HEP    Currently in Pain? No/denies              [facility] PT Assessment - 06/04/20 0001      Assessment   Medical Diagnosis Left breast cancer    Referring Provider (PT) Dr. [name] [place]    Onset Date/Surgical Date 05/12/20    Hand Dominance Right    Prior Therapy none      Precautions   Precautions Other (comment)    Precaution Comments active cancer      Restrictions   Weight Bearing Restrictions No      Balance Screen   Has the patient fallen in the  past 6 months No    Has the patient had a decrease in activity level because of a fear of falling?  No    Is the patient reluctant to leave their home because of a fear of falling?  No      Home Environment   Living Environment Private residence    Living Arrangements Children   19 and 8 y.o. daughters   Available Help at Discharge Family   Her Mom is going to stay with her during treatment     Prior Function   Level of Independence Independent    Vocation Full time employment    Vocation Requirements [employer] [place] She walks twice a day 5x/week for 30 min each time      Cognition   Overall Cognitive Status Within Functional Limits for tasks assessed      Posture/Postural Control   Posture/Postural Control Postural limitations    Postural Limitations Rounded Shoulders;Forward head;Increased lumbar lordosis      ROM / Strength   AROM / PROM / Strength AROM;Strength      AROM   Overall AROM Comments Cervical AROM is WNL  AROM Assessment Site Shoulder    Right/Left Shoulder Right;Left    Right Shoulder Extension 56 Degrees    Right Shoulder Flexion 147 Degrees    Right Shoulder ABduction 159 Degrees    Right Shoulder Internal Rotation 60 Degrees    Right Shoulder External Rotation 78 Degrees    Left Shoulder Extension 55 Degrees    Left Shoulder Flexion 138 Degrees    Left Shoulder ABduction 163 Degrees    Left Shoulder Internal Rotation 56 Degrees    Left Shoulder External Rotation 78 Degrees      Strength   Overall Strength Within functional limits for tasks performed             LYMPHEDEMA/ONCOLOGY QUESTIONNAIRE - 06/04/20 0001      Type   Cancer Type Left breast cancer      Lymphedema Assessments   Lymphedema Assessments Upper extremities      Right Upper Extremity Lymphedema   10 cm Proximal to Olecranon Process 33 cm    Olecranon Process 26.5 cm    10 cm Proximal to Ulnar Styloid Process 22.6 cm    Just Proximal to Ulnar  Styloid Process 16 cm    Across Hand at [employer] 19 cm    At [place] of 2nd Digit 6.4 cm      Left Upper Extremity Lymphedema   10 cm Proximal to Olecranon Process 32.8 cm    Olecranon Process 25.8 cm    10 cm Proximal to Ulnar Styloid Process 21.6 cm    Just Proximal to Ulnar Styloid Process 15.7 cm    Across Hand at [employer] 19.3 cm    At [place] of 2nd Digit 6.1 cm           L-DEX FLOWSHEETS - 06/04/20 1200      L-DEX LYMPHEDEMA SCREENING   Measurement Type Unilateral    L-DEX MEASUREMENT EXTREMITY Upper Extremity    POSITION  Standing    DOMINANT SIDE Right    At Risk Side Left    BASELINE SCORE (UNILATERAL) -1.2                Quick Dash - 06/04/20 0001    Open a tight or new jar Mild difficulty    Do heavy household chores (wash walls, wash floors) Mild difficulty    Carry a shopping bag or briefcase No difficulty    Wash your back Mild difficulty    Use a knife to cut food No difficulty    Recreational activities in which you take some force or impact through your arm, shoulder, or hand (golf, hammering, tennis) Mild difficulty    During the past week, to what extent has your arm, shoulder or hand problem interfered with your normal social activities with family, friends, neighbors, or groups? Slightly    During the past week, to what extent has your arm, shoulder or hand problem limited your work or other regular daily activities Slightly    Arm, shoulder, or hand pain. None    Tingling (pins and needles) in your arm, shoulder, or hand Mild    Difficulty Sleeping Mild difficulty    DASH Score 18.18 %            Objective measurements completed on examination: See above findings.       Patient was instructed today in a home exercise program today for post op shoulder range of motion. These included active assist shoulder flexion in sitting, scapular retraction, wall  walking with shoulder abduction, and hands behind head external rotation.  She  was encouraged to do these twice a day, holding 3 seconds and repeating 5 times when permitted by her physician.            PT Education - 06/04/20 1253    Education Details Lymphedema risk reduction and post op shoulder ROM HEP    Person(s) Educated Patient;Parent(s)    Methods Explanation;Demonstration;Handout    Comprehension Returned demonstration;Verbalized understanding               PT Long Term Goals - 06/04/20 1258      PT LONG TERM GOAL #1   Title Patient will demonstrate she has regained full shoulder ROM and function post operatively compared to baselines.    Time 6    Period Months    Status New    Target Date 12/05/20           Breast Clinic Goals - 06/04/20 1258      Patient will be able to verbalize understanding of pertinent lymphedema risk reduction practices relevant to her diagnosis specifically related to skin care.   Time 1    Period Days    Status Achieved      Patient will be able to return demonstrate and/or verbalize understanding of the post-op home exercise program related to regaining shoulder range of motion.   Time 1    Status Achieved      Patient will be able to verbalize understanding of the importance of attending the postoperative After Breast Cancer Class for further lymphedema risk reduction education and therapeutic exercise.   Time 1    Period Days    Status Achieved                 Plan - 06/04/20 1254    Clinical Impression Statement Patient was diagnosed on 05/12/2020 with left breast cancer. It measures 2.7 cm and is located in hte upper outer quadrant. 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 Her multidisciplinary medical team met prior to her assessments to determine a recommended treatment plan. She is planning to have neoadjuvant chemotherapy followed by a left lumpectomy, radiation, and anti-estrogen therapy. She will benefit from a post op PT reassessment to determine needs and from L-Dex  screens every 3 months for 2 years.    Stability/Clinical Decision Making Stable/Uncomplicated    Clinical Decision Making Low    Rehab Potential Excellent    PT Frequency --   Eval and 1 f/u visit followed by L-Dex screens every 3 months for 2 years   PT Treatment/Interventions ADLs/Self Care Home Management;Therapeutic exercise;Patient/family education    PT Next Visit Plan Will reassess 3-4 weeks post op to determine needs    PT Home Exercise Plan Post op shoulder ROM HEP    Consulted and Agree with Plan of Care Patient;Family member/caregiver    Family Member Consulted Mother           Patient will benefit from skilled therapeutic intervention in order to improve the following deficits and impairments:  Postural dysfunction, Decreased range of motion, Impaired UE functional use, Pain, Decreased knowledge of precautions  Visit Diagnosis: Malignant neoplasm of upper-outer quadrant of left breast in female, estrogen receptor positive ([place]) - Plan: PT plan of care cert/re-cert  Abnormal posture - Plan: PT plan of care cert/re-cert   Patient will follow up at outpatient cancer rehab 3-4 weeks following surgery.  If the patient requires physical therapy  at that time, a specific plan will be dictated and sent to the referring physician for approval. The patient was educated today on appropriate basic range of motion exercises to begin post operatively and the importance of attending the After Breast Cancer class following surgery.  Patient was educated today on lymphedema risk reduction practices as it pertains to recommendations that will benefit the patient immediately following surgery.  She verbalized good understanding.      Problem List Patient Active Problem List   Diagnosis Date Noted  . Malignant neoplasm of upper-outer quadrant of left breast in female, estrogen receptor positive ([place]) 06/03/2020   Annia Friendly, PT 06/04/20 1:10 PM  [place], [place], [address] Phone: [phone]   Fax:  [phone]  Name: GIOVANNA KEMMERER MRN: [id number] Date of Birth: Jul 18, 1986

## 2020-06-04 NOTE — Progress Notes (Signed)
START ON PATHWAY REGIMEN - Breast     A cycle is every 14 days (cycles 1-4):     Doxorubicin      Cyclophosphamide      Pegfilgrastim-xxxx    A cycle is every 21 days (cycles 5-8):     Paclitaxel      Carboplatin   **Always confirm dose/schedule in your pharmacy ordering system**  Patient Characteristics: Preoperative or Nonsurgical Candidate (Clinical Staging), Neoadjuvant Therapy followed by Surgery, Invasive Disease, Chemotherapy, HER2 Negative/Unknown/Equivocal, ER Negative/Unknown, Platinum Therapy Indicated Therapeutic Status: Preoperative or Nonsurgical Candidate (Clinical Staging) AJCC M Category: cM0 AJCC Grade: G3 Breast Surgical Plan: Neoadjuvant Therapy followed by Surgery ER Status: Negative (-) AJCC 8 Stage Grouping: IIB HER2 Status: Negative (-) AJCC T Category: cT2 AJCC N Category: cN0 PR Status: Negative (-) Type of Therapy: Platinum Therapy Indicated Intent of Therapy: Curative Intent, Discussed with Patient

## 2020-06-04 NOTE — H&P (Signed)
Jeanne Evans Documented: 06/04/2020 8:56 AM Location: [facility] Patient #: [id number] DOB: 1986/02/22 Undefined / Language: [name] / Race: Black or African American Female  History of Present Illness [name] A. Darlette Dubow MD; 06/04/2020 10:21 AM) Patient words: Pt sent at the request of Dr Lisbeth Renshaw for left breast mass that is painful for 1 month. Mammogram/ U/S shows 2.7 cm UOQ left breast mass ER weak positive PR negative her 2 neu negative ki 67 95% No family history of breast cancer No other complaints. Pain is worse after biopsy.  The patient is a 34 year old female.   Allergies [name], [place]; 06/04/2020 8:59 AM) No Known Drug Allergies [06/02/2020]: Allergies Reconciled  Medication History [name], [place]; 06/04/2020 8:59 AM) Albuterol Sulfate HFA (108 (90 Base)MCG/ACT Aerosol Soln, Inhalation) Active. No Current Medications (Taken starting 06/04/2020) Amoxicillin (500MG  Capsule, Oral) Active. metroNIDAZOLE (500MG  Tablet, Oral) Active. Ondansetron (4MG  Tablet Disint, Oral) Active. valACYclovir HCl (500MG  Tablet, Oral) Active. valACYclovir HCl (1GM Tablet, Oral) Active. predniSONE (20MG  Tablet, Oral) Active.     Review of Systems (Emmilee Reamer A. Dayne Chait MD; 06/04/2020 10:21 AM) All other systems negative   Physical Exam (Shaquoya Cosper A. Rainy Rothman MD; 06/04/2020 10:22 AM)  Head and Neck Head-normocephalic, atraumatic with no lesions or palpable masses.  Eye Eyeball - Bilateral-Extraocular movements intact. Sclera/Conjunctiva - Bilateral-No scleral icterus.  Chest and Lung Exam Chest and lung exam reveals -quiet, even and easy respiratory effort with no use of accessory muscles and on auscultation, normal breath sounds, no adventitious sounds and normal vocal resonance. Inspection Chest Wall - Normal. Back - normal.  Breast Note: left breast mass 3 cm mobile tender LUOQ right breast normal  Cardiovascular Cardiovascular examination  reveals -on palpation PMI is normal in location and amplitude, no palpable S3 or S4. Normal cardiac borders., normal heart sounds, regular rate and rhythm with no murmurs, carotid auscultation reveals no bruits and normal pedal pulses bilaterally.  Neurologic Neurologic evaluation reveals -alert and oriented x 3 with no impairment of recent or remote memory. Mental Status-Normal.  Musculoskeletal Normal Exam - Left-Upper Extremity Strength Normal and Lower Extremity Strength Normal. Normal Exam - Right-Upper Extremity Strength Normal, Lower Extremity Weakness.    Assessment & Plan (Tye Vigo A. Neko Mcgeehan MD; 06/04/2020 10:27 AM)  BREAST CANCER, STAGE 2, LEFT (C50.912) Impression: neoadjuvant chemotherapy breast conservation down the road MRI schedule for port Pt requires port placement for chemotherapy. Risk include bleeding, infection, pneumothorax, hemothorax, mediastinal injury, nerve injury , blood vessel injury, stroke, blood clots, death, migration. embolization and need for additional procedures. Pt agrees to proceed.  Current Plans You are being scheduled for surgery- Our schedulers will call you.  You should hear from our office's scheduling department within 5 working days about the location, date, and time of surgery. We try to make accommodations for patient's preferences in scheduling surgery, but sometimes the OR schedule or the surgeon's schedule prevents [place] from making those accommodations.  If you have not heard from our office [phone]) in 5 working days, call the office and ask for your surgeon's nurse.  If you have other questions about your diagnosis, plan, or surgery, call the office and ask for your surgeon's nurse.  Use of a central venous catheter for intravenous therapy was discussed. Technique of catheter placement using ultrasound and fluoroscopy guidance was discussed. Risks such as bleeding, infection, pneumothorax, catheter occlusion,  reoperation, and other risks were discussed. I noted a good likelihood this will help address the problem. Questions were answered. The patient  expressed understanding & wishes to proceed.

## 2020-06-05 ENCOUNTER — Telehealth: Payer: Self-pay | Admitting: Oncology

## 2020-06-05 NOTE — Telephone Encounter (Signed)
Scheduled appts per 7/21 los. Pt confirmed appt date and time.  

## 2020-06-10 ENCOUNTER — Other Ambulatory Visit: Payer: Self-pay

## 2020-06-10 ENCOUNTER — Encounter (HOSPITAL_BASED_OUTPATIENT_CLINIC_OR_DEPARTMENT_OTHER): Payer: Self-pay | Admitting: Surgery

## 2020-06-11 ENCOUNTER — Other Ambulatory Visit: Payer: Self-pay | Admitting: Oncology

## 2020-06-12 ENCOUNTER — Other Ambulatory Visit: Payer: Self-pay | Admitting: Oncology

## 2020-06-12 ENCOUNTER — Telehealth: Payer: Self-pay | Admitting: *Deleted

## 2020-06-12 ENCOUNTER — Encounter: Payer: Self-pay | Admitting: *Deleted

## 2020-06-12 ENCOUNTER — Other Ambulatory Visit: Payer: Self-pay | Admitting: Pharmacist

## 2020-06-12 DIAGNOSIS — Z170 Estrogen receptor positive status [ER+]: Secondary | ICD-10-CM

## 2020-06-12 DIAGNOSIS — C50412 Malignant neoplasm of upper-outer quadrant of left female breast: Secondary | ICD-10-CM

## 2020-06-12 MED ORDER — PEGFILGRASTIM-BMEZ 6 MG/0.6ML ~~LOC~~ SOSY: 6.0000 mg | PREFILLED_SYRINGE | SUBCUTANEOUS | 3 refills | Status: DC

## 2020-06-12 NOTE — Telephone Encounter (Signed)
Spoke to pt concerning Jeanne Evans from 7.21.21. Denies questions or concerns regarding dx or treatment care plan. Encourage pt to call with needs. Received verbal understanding.  Pt did relate she would like to hold tx on 8/19 d/t parents anniversary party on 8/21. Informed pt that I would pass message along.

## 2020-06-13 ENCOUNTER — Ambulatory Visit (HOSPITAL_COMMUNITY)
Admission: RE | Admit: 2020-06-13 | Discharge: 2020-06-13 | Disposition: A | Discharge status: Home / Self Care | Payer: 59 | Source: Ambulatory Visit | Attending: Oncology | Admitting: Oncology

## 2020-06-13 ENCOUNTER — Ambulatory Visit (HOSPITAL_BASED_OUTPATIENT_CLINIC_OR_DEPARTMENT_OTHER)
Admission: RE | Admit: 2020-06-13 | Discharge: 2020-06-13 | Disposition: A | Discharge status: Home / Self Care | Payer: 59 | Source: Ambulatory Visit | Attending: Oncology | Admitting: Oncology

## 2020-06-13 ENCOUNTER — Encounter (HOSPITAL_COMMUNITY)
Admission: RE | Admit: 2020-06-13 | Discharge: 2020-06-13 | Disposition: A | Discharge status: Home / Self Care | Payer: 59 | Source: Ambulatory Visit | Attending: Oncology | Admitting: Oncology

## 2020-06-13 ENCOUNTER — Other Ambulatory Visit: Payer: Self-pay

## 2020-06-13 DIAGNOSIS — Z170 Estrogen receptor positive status [ER+]: Secondary | ICD-10-CM | POA: Diagnosis present

## 2020-06-13 DIAGNOSIS — C50412 Malignant neoplasm of upper-outer quadrant of left female breast: Secondary | ICD-10-CM | POA: Insufficient documentation

## 2020-06-13 LAB — ECHOCARDIOGRAM COMPLETE
Area-P 1/2: 2.99 cm2
S' Lateral: 2.5 cm

## 2020-06-13 MED ORDER — SODIUM CHLORIDE (PF) 0.9 % IJ SOLN
INTRAMUSCULAR | Status: AC
  Filled 2020-06-13: qty 50

## 2020-06-13 MED ORDER — IOHEXOL 300 MG/ML  SOLN
75.0000 mL | Freq: Once | INTRAMUSCULAR | Status: AC | PRN
  Administered 2020-06-13: 75 mL via INTRAVENOUS

## 2020-06-13 MED ORDER — TECHNETIUM TC 99M MEDRONATE IV KIT
19.6000 | PACK | Freq: Once | INTRAVENOUS | Status: AC
  Administered 2020-06-13: 19.6 via INTRAVENOUS

## 2020-06-13 NOTE — Progress Notes (Signed)
Pharmacist Chemotherapy Monitoring - Initial Assessment    Anticipated start date: 06/19/2020   Regimen:  . Are orders appropriate based on the patient's diagnosis, regimen, and cycle? Yes . Does the plan date match the patient's scheduled date? Yes . Is the sequencing of drugs appropriate? Yes . Are the premedications appropriate for the patient's regimen? Yes . Prior Authorization for treatment is: Approved o If applicable, is the correct biosimilar selected based on the patient's insurance? not applicable  Organ Function and Labs: Marland Kitchen Are dose adjustments needed based on the patient's renal function, hepatic function, or hematologic function? Yes . Are appropriate labs ordered prior to the start of patient's treatment? Yes . Other organ system assessment, if indicated: anthracyclines: Echo/ MUGA . The following baseline labs, if indicated, have been ordered: N/A  Dose Assessment: . Are the drug doses appropriate? Yes . Are the following correct: o Drug concentrations Yes o IV fluid compatible with drug Yes o Administration routes Yes o Timing of therapy Yes . If applicable, does the patient have documented access for treatment and/or plans for port-a-cath placement? yes . If applicable, have lifetime cumulative doses been properly documented and assessed? yes Lifetime Dose Tracking  No doses have been documented on this patient for the following tracked chemicals: Doxorubicin, Epirubicin, Idarubicin, Daunorubicin, Mitoxantrone, Bleomycin, Oxaliplatin, Carboplatin, Liposomal Doxorubicin  o   Toxicity Monitoring/Prevention: . The patient has the following take home antiemetics prescribed: Prochlorperazine . The patient has the following take home medications prescribed: N/A . Medication allergies and previous infusion related reactions, if applicable, have been reviewed and addressed. No . The patient's current medication list has been assessed for drug-drug interactions with their  chemotherapy regimen. no significant drug-drug interactions were identified on review.  Order Review: . Are the treatment plan orders signed? Yes . Is the patient scheduled to see a provider prior to their treatment? Yes  I verify that I have reviewed each item in the above checklist and answered each question accordingly.  Ha Placeres D 06/13/2020 11:25 AM

## 2020-06-13 NOTE — Progress Notes (Signed)
  Echocardiogram 2D Echocardiogram has been performed.  Jennette Dubin 06/13/2020, 9:51 AM

## 2020-06-14 ENCOUNTER — Other Ambulatory Visit (HOSPITAL_COMMUNITY): Payer: 59

## 2020-06-16 ENCOUNTER — Other Ambulatory Visit: Payer: Self-pay

## 2020-06-16 ENCOUNTER — Ambulatory Visit (HOSPITAL_COMMUNITY)
Admission: RE | Admit: 2020-06-16 | Discharge: 2020-06-16 | Disposition: A | Discharge status: Home / Self Care | Payer: 59 | Source: Ambulatory Visit | Attending: Oncology | Admitting: Oncology

## 2020-06-16 ENCOUNTER — Other Ambulatory Visit (HOSPITAL_COMMUNITY)
Admission: RE | Admit: 2020-06-16 | Discharge: 2020-06-16 | Disposition: A | Discharge status: Home / Self Care | Payer: 59 | Source: Ambulatory Visit | Attending: Surgery | Admitting: Surgery

## 2020-06-16 ENCOUNTER — Encounter: Payer: Self-pay | Admitting: *Deleted

## 2020-06-16 ENCOUNTER — Inpatient Hospital Stay: Payer: 59 | Attending: Oncology

## 2020-06-16 DIAGNOSIS — J45909 Unspecified asthma, uncomplicated: Secondary | ICD-10-CM | POA: Insufficient documentation

## 2020-06-16 DIAGNOSIS — C50412 Malignant neoplasm of upper-outer quadrant of left female breast: Secondary | ICD-10-CM | POA: Insufficient documentation

## 2020-06-16 DIAGNOSIS — Z171 Estrogen receptor negative status [ER-]: Secondary | ICD-10-CM | POA: Insufficient documentation

## 2020-06-16 DIAGNOSIS — K30 Functional dyspepsia: Secondary | ICD-10-CM | POA: Insufficient documentation

## 2020-06-16 DIAGNOSIS — Z801 Family history of malignant neoplasm of trachea, bronchus and lung: Secondary | ICD-10-CM | POA: Insufficient documentation

## 2020-06-16 DIAGNOSIS — Z793 Long term (current) use of hormonal contraceptives: Secondary | ICD-10-CM | POA: Insufficient documentation

## 2020-06-16 DIAGNOSIS — Z20822 Contact with and (suspected) exposure to covid-19: Secondary | ICD-10-CM | POA: Insufficient documentation

## 2020-06-16 DIAGNOSIS — Z170 Estrogen receptor positive status [ER+]: Secondary | ICD-10-CM | POA: Insufficient documentation

## 2020-06-16 DIAGNOSIS — D869 Sarcoidosis, unspecified: Secondary | ICD-10-CM | POA: Insufficient documentation

## 2020-06-16 DIAGNOSIS — Z5111 Encounter for antineoplastic chemotherapy: Secondary | ICD-10-CM | POA: Insufficient documentation

## 2020-06-16 DIAGNOSIS — Z7952 Long term (current) use of systemic steroids: Secondary | ICD-10-CM | POA: Insufficient documentation

## 2020-06-16 DIAGNOSIS — Z79899 Other long term (current) drug therapy: Secondary | ICD-10-CM | POA: Insufficient documentation

## 2020-06-16 DIAGNOSIS — Z01812 Encounter for preprocedural laboratory examination: Secondary | ICD-10-CM | POA: Insufficient documentation

## 2020-06-16 DIAGNOSIS — Z8042 Family history of malignant neoplasm of prostate: Secondary | ICD-10-CM | POA: Insufficient documentation

## 2020-06-16 DIAGNOSIS — K769 Liver disease, unspecified: Secondary | ICD-10-CM | POA: Insufficient documentation

## 2020-06-16 DIAGNOSIS — Z8041 Family history of malignant neoplasm of ovary: Secondary | ICD-10-CM | POA: Insufficient documentation

## 2020-06-16 LAB — SARS CORONAVIRUS 2 (TAT 6-24 HRS): SARS Coronavirus 2: NEGATIVE

## 2020-06-16 MED ORDER — GADOBUTROL 1 MMOL/ML IV SOLN
7.0000 mL | Freq: Once | INTRAVENOUS | Status: AC | PRN
  Administered 2020-06-16: 7 mL via INTRAVENOUS

## 2020-06-16 NOTE — Progress Notes (Signed)
Jeanne Evans  Telephone:([phone]) [phone] Fax:(336) [phone]     ID: Jeanne Evans DOB: 06-Aug-1986  MR#: [id number]  BZJ#:[id number]  Patient Care Team: [name], MD as PCP - General (Obstetrics and Gynecology) Rockwell Germany, RN as Oncology Nurse Navigator Mauro Kaufmann, RN as Oncology Nurse Navigator Erroll Luna, MD as Consulting Physician (General Surgery) Zakiyyah Savannah, Virgie Dad, MD as Consulting Physician (Oncology) Kyung Rudd, MD as Consulting Physician (Radiation Oncology) Servando Salina, MD as Consulting Physician (Obstetrics and Gynecology) Chauncey Cruel, MD OTHER MD:  CHIEF COMPLAINT: 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 She was evaluated in the multidisciplinary breast cancer clinic on 06/04/2020.  She is accompanied by her daughter  Since consultation, she underwent staging chest CT and bone scan on 06/13/2020. Chest CT showed: left breast mass with mildly enlarged axillary lymph nodes and rounded left subpectoral node; multiple ill-defined pulmonary nodules, signs of pleural nodularity and relatively symmetric bihilar and mediastinal adenopathy most suggestive of sarcoidosis; multiple hepatic lesions.  Bone scan showed no evidence of osseous metastatic disease.  Echocardiogram was also performed the same day showing an ejection fraction of 60-65%.  She also underwent breast MRI yesterday, 06/16/2020, showing: breast composition C; biopsy-proven malignancy in slightly upper-outer left breast, maximum measurement of 3.9 cm by MRI; indeterminate stippled non-mass enhancement contiguous with and immediately anterior to dominant left breast mass, extending 2.1 cm anterior to the mass (together with the mass measuring 5.5 cm);no evidence of right breast malignancy or lymphadenopathy. Biopsy is  recommended of the anterior non-mass enhancement.  Finally she met with our chemotherapy teaching nurse and found that very productive.  REVIEW OF SYSTEMS: Jeanne Evans is going to have her port placed today and she had many questions regarding that.  She does not have a written schedule yet but she will get one today.  She brought in some FMLA papers and she wanted to follow-up on that.  Also she tells me she is very concerned that she has paid for celebration for her parents scheduled for August 21 and she is scheduled for chemo August 19.  Aside from these issues a detailed review of systems today was stable and in particular she has no cough phlegm production pleurisy shortness of breath fever or rash.   HISTORY OF CURRENT ILLNESS: From the original intake note:  Jeanne Evans herself palpated a painful left breast lump. She underwent bilateral diagnostic mammography with tomography and left breast ultrasonography at [facility] on 05/12/2020 showing: breast density category B; palpable 2.7 cm oval hypoechoic mass in left breast at 2-3 o'clock; 2.2 cm left axillary lymph node/grouping of nodes.  Accordingly on 05/29/2020 she proceeded to biopsy of the left breast area in question. The pathology from this procedure ([id number]) showed: invasive ductal carcinoma, grade 3. Prognostic indicators significant for: estrogen receptor, 10% positive with weak staining intensity and progesterone receptor, 0% negative. Proliferation marker Ki67 at 95%. HER2 equivocal by immunohistochemistry (2+), but negative by fluorescent in situ hybridization with a signals ratio 1.77 and number per cell 2.65.  Biopsy of the left axillary lymph node in question was negative and concordant  The patient's subsequent history is as detailed below.   PAST MEDICAL HISTORY: Past Medical History:  Diagnosis Date  . Breast cancer ([place])   . Family history of ovarian cancer   . Family history of prostate cancer     .  History of asthma    has albuterol, uses PRN  . History of heart murmur in childhood   . History of multiple allergies   History of allergy related headaches   PAST SURGICAL HISTORY: No past surgical history on file.  She reports having a boil lanced from her perineal area   FAMILY HISTORY: Family History  Problem Relation Age of Onset  . Lung cancer Maternal Uncle        dx late 74s  . Prostate cancer Paternal Uncle        dx late 24s  . Ovarian cancer Paternal Grandmother        dx 64s  . Prostate cancer Paternal Grandfather        dx 40s  . Prostate cancer Paternal Uncle        dx early 24s  The patient's father is 87 and her mother 29, as of 05/2020.  The patient has two brothers and one sister. She reports ovarian cancer in her paternal grandmother, prostate cancer in her paternal grandfather and a paternal uncle, and lung cancer in a maternal uncle.   GYNECOLOGIC HISTORY:  No LMP recorded. (Menstrual status: IUD). Menarche: 34 years old Age at first live birth: 34 years old [place] P 2 LMP current, experiences spotting Contraceptive: Mirena IUD in place HRT n/a  Hysterectomy? no BSO? 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 She describes herself as single. She lives at home with her children, Jeanne Evans (age 43) and Jeanne Evans (age 64).     ADVANCED DIRECTIVES: Not in place. She intends to name her mother, Jeanne Evans, as her HCPOA.  Jeanne Evans lives in [place] and can be reached at [phone].  The patient was given the appropriate documents to complete and notarized at her discretion at the time of her visit 06/04/2020   HEALTH MAINTENANCE: Social History   Tobacco Use  . Smoking status: Never Smoker  . Smokeless tobacco: Never Used  Substance Use Topics  . Alcohol use: Yes    Comment: occas  . Drug use: Yes    Types: Marijuana     Colonoscopy:  n/a (age)  PAP: 04/2020  Bone density: n/a (age)   Allergies  Allergen Reactions  . Sulfa Antibiotics Hives    Current Outpatient Medications  Medication Sig Dispense Refill  . albuterol (VENTOLIN HFA) 108 (90 Base) MCG/ACT inhaler Inhale into the lungs every 6 (six) hours as needed for wheezing or shortness of breath.    . dexamethasone (DECADRON) 4 MG tablet Take 2 tablets by mouth daily starting the day after Carboplatin and Cytoxan x 3 days. Take with food. 30 tablet 1  . levonorgestrel (MIRENA) 20 MCG/24HR IUD 1 each by Intrauterine route once.    . lidocaine-prilocaine (EMLA) cream Apply to affected area once 30 g 3  . LORazepam (ATIVAN) 0.5 MG tablet Take 1 tablet (0.5 mg total) by mouth at bedtime as needed (Nausea or vomiting). 20 tablet 0  . pegfilgrastim-bmez (ZIEXTENZO) 6 MG/0.6ML injection Inject 0.6 mLs (6 mg total) into the skin every 14 (fourteen) days. 1 Syringe 3  . prochlorperazine (COMPAZINE) 10 MG tablet Take 1 tablet (10 mg total) by mouth every 6 (six) hours as needed (Nausea or vomiting). 30 tablet 1   No current facility-administered medications for this visit.    OBJECTIVE: African-American woman in no acute distress  There were no vitals filed for this visit.  There is no height or weight on file to calculate BMI.   Wt Readings from Last 3 Encounters:  06/04/20 159 lb (72.1 kg)      ECOG FS:1 - Symptomatic but completely ambulatory  Sclerae unicteric, EOMs intact Wearing a mask No cervical or supraclavicular adenopathy Lungs no rales or rhonchi Heart regular rate and rhythm Abd soft, nontender, positive bowel sounds MSK no focal spinal tenderness, no upper extremity lymphedema Neuro: nonfocal, well oriented, appropriate affect Breasts: The right breast is benign.  In the left breast upper outer quadrant there is an easily palpable mass which does not involve the skin or nipple.  Both axillae are benign.   LAB RESULTS:  CMP     Component Value  Date/Time   NA 139 06/04/2020 0830   K 4.0 06/04/2020 0830   CL 107 06/04/2020 0830   CO2 25 06/04/2020 0830   GLUCOSE 97 06/04/2020 0830   BUN 13 06/04/2020 0830   CREATININE 0.92 06/04/2020 0830   CALCIUM 9.5 06/04/2020 0830   PROT 7.6 06/04/2020 0830   ALBUMIN 3.9 06/04/2020 0830   AST 13 (L) 06/04/2020 0830   ALT 14 06/04/2020 0830   ALKPHOS 61 06/04/2020 0830   BILITOT 0.5 06/04/2020 0830   GFRNONAA >60 06/04/2020 0830   GFRAA >60 06/04/2020 0830    No results found for: TOTALPROTELP, ALBUMINELP, A1GS, A2GS, BETS, BETA2SER, GAMS, MSPIKE, SPEI  Lab Results  Component Value Date   WBC 5.8 06/04/2020   NEUTROABS 2.6 06/04/2020   HGB 12.2 06/04/2020   HCT 36.5 06/04/2020   MCV 90.3 06/04/2020   PLT 290 06/04/2020    No results found for: LABCA2  No components found for: [id number]  No results for input(s): INR in the last 168 hours.  No results found for: LABCA2  No results found for: UYQ034  No results found for: VQQ595  No results found for: GLO756  No results found for: CA2729  No components found for: HGQUANT  No results found for: CEA1 / No results found for: CEA1   No results found for: AFPTUMOR  No results found for: CHROMOGRNA  No results found for: KPAFRELGTCHN, LAMBDASER, KAPLAMBRATIO (kappa/lambda light chains)  No results found for: HGBA, HGBA2QUANT, HGBFQUANT, HGBSQUAN (Hemoglobinopathy evaluation)   No results found for: LDH  No results found for: IRON, TIBC, IRONPCTSAT (Iron and TIBC)  No results found for: FERRITIN  Urinalysis No results found for: COLORURINE, APPEARANCEUR, LABSPEC, PHURINE, GLUCOSEU, HGBUR, BILIRUBINUR, KETONESUR, PROTEINUR, UROBILINOGEN, NITRITE, LEUKOCYTESUR   STUDIES: CT CHEST W CONTRAST  Result Date: 06/13/2020 CLINICAL DATA:  Breast cancer staging EXAM: CT CHEST WITH CONTRAST TECHNIQUE: Multidetector CT imaging of the chest was performed during intravenous contrast administration. CONTRAST:  40m  OMNIPAQUE IOHEXOL 300 MG/ML  SOLN COMPARISON:  None FINDINGS: Cardiovascular: Heart size is normal. No pericardial effusion. Central pulmonary vasculature is unremarkable on venous phase assessment. Aorta is of normal caliber without significant atherosclerosis. Mediastinum/Nodes: No thoracic inlet adenopathy. Small lymph nodes in the LEFT axilla, biopsy marker in place in the region of the LEFT axilla and also within a LEFT breast mass. LEFT breast mass in the LEFT breast laterally measuring 3.9 x 2.7 cm towards the upper outer quadrant. Small lymph node containing a marker measuring 8 mm short axis. There are other lymph nodes tracking beneath the pectoralis musculature largest measuring 7 mm short axis. Most of these lymph nodes retain fatty hila. Similar small lymph nodes are seen in the RIGHT axilla Enlargement of subcarinal, RIGHT paratracheal  and by hilar lymph nodes. RIGHT hilar lymph node (image 62, series 2) 11 mm short axis Subcarinal lymph node (image 56, series 2) 12 mm short axis. (Image 48, series 2) 15 mm RIGHT hilar lymph node. No retrocrural adenopathy. Slightly smaller lymph nodes throughout the LEFT hilum and scattered about the RIGHT hilum as well. Small lymph nodes track into the RIGHT paratracheal chain, these are less than a cm. No enlarged internal mammary lymph nodes, a single un enlarged node is seen on image 41 of series 2 Lungs/Pleura: Bilateral upper lobe nodules, too numerous to count. These are small, less than a cm. Also involvement of the superior segment of the RIGHT lower lobe greater than LEFT with tiny 4 mm nodules. (Image 70, series 7) 5 mm RIGHT lower lobe pulmonary nodule slightly inferior to superior segment. Airways are patent. There is some nodule seen along the fissure, major fissure in the RIGHT chest predominantly on the RIGHT. Upper Abdomen: Numerous liver lesions, greater than 220 lesions in the visualized portions of the liver. Visualized adrenal glands are normal.  No acute upper abdominal process. Spleen is normal size. Musculoskeletal: No acute musculoskeletal finding or destructive bone lesion. IMPRESSION: 1. LEFT breast mass with mildly enlarged axillary lymph nodes and a rounded LEFT subpectoral lymph node as described. 2. Multiple ill-defined pulmonary nodules, signs of pleural nodularity and relatively symmetric bihilar and mediastinal adenopathy most suggestive of sarcoidosis in this patient with history of breast cancer. Biopsy may be required to exclude the possibility of metastatic disease though the pattern of upper lobe disease combined with the pattern of mediastinal disease appears atypical for breast cancer. 3. Multiple hepatic lesions may also require tissue sampling but could also be seen in the setting of metastatic disease or sarcoidosis. 4. Aortic atherosclerosis. Aortic Atherosclerosis (ICD10-I70.0). Electronically Signed   By: Zetta Bills M.D.   On: 06/13/2020 22:07   NM Bone Scan Whole Body  Result Date: 06/14/2020 CLINICAL DATA:  34 year old with a recent diagnosis of grade 3 invasive ductal carcinoma involving the LEFT breast. Initial staging. EXAM: NUCLEAR MEDICINE WHOLE BODY BONE SCAN TECHNIQUE: Whole body anterior and posterior images were obtained approximately 3 hours after intravenous injection of radiopharmaceutical. RADIOPHARMACEUTICALS:  19.6 mCi Technetium-67mMDP IV COMPARISON:  None. FINDINGS: No abnormal osseous activity in the axial or appendicular skeleton to suggest metastatic disease. Physiologic excretion of the radiopharmaceutical by the urinary tract. IMPRESSION: Normal examination.  No evidence of osseous metastatic disease. Electronically Signed   By: TEvangeline DakinM.D.   On: 06/14/2020 15:32   MR BREAST BILATERAL W WO CONTRAST INC CAD  Result Date: 06/16/2020 CLINICAL DATA:  34year old with biopsy-proven grade 3 invasive ductal carcinoma involving the UPPER OUTER QUADRANT of the LEFT breast (ER positive, PR  negative, HER 2 Neu negative, Ki67 95%). Biopsy of a LEFT axillary lymph node with eccentric cortical thickening demonstrated no evidence of metastatic disease. Pretreatment MRI is performed to confirm extent of disease. LABS:  Not applicable. EXAM: BILATERAL BREAST MRI WITH AND WITHOUT CONTRAST TECHNIQUE: Multiplanar, multisequence MR images of both breasts were obtained prior to and following the intravenous administration of 7 ml of Gadavist. Three-dimensional MR images were rendered by post-processing of the original MR data on an independent workstation. The three-dimensional MR images were interpreted, and findings are reported in the following complete MRI report for this study. Three dimensional images were evaluated at the independent interpreting workstation using the DynaCAD thin client. COMPARISON:  No prior MRI. Recent mammography and LEFT  breast ultrasound. CT chest 06/13/2020 is also correlated. FINDINGS: Breast composition: c. Heterogeneous fibroglandular tissue. Background parenchymal enhancement: Mild. Right breast: No evidence of mass or abnormal enhancement. Left breast: The palpable lump was marked with a Vitamin-E capsule. Heterogeneously enhancing mass in the outer breast, slight upper outer quadrant, biopsy-proven grade IDC, measuring approximately 3.5 X 3.4 X 3.9 cm (AP x transverse x craniocaudal), demonstrating rapid washout kinetics. Contiguous with and immediately anterior to the enhancing mass is stippled non-mass enhancement measuring approximately 2.1 x 2.6 x 2.7 cm, demonstrating progressive enhancement kinetics. Lymph nodes: No pathologic lymphadenopathy. Ancillary findings:  None. IMPRESSION: 1. Biopsy-proven grade 3 invasive ductal carcinoma involving the outer LEFT breast at posterior depth, slight upper outer quadrant, with maximum measurement 3.9 cm by MRI. 2. Indeterminate stippled non-mass enhancement contiguous with and immediately anterior to the biopsy-proven malignancy in  the outer LEFT breast. The non-mass enhancement in total is measured above and extends approximately 2.1 cm anterior to the mass. (Total AP span of the mass and non-mass enhancement is approximately 5.5 cm). 3. No MRI evidence of malignancy involving the RIGHT breast. 4. No pathologic lymphadenopathy. RECOMMENDATION: MRI guided biopsy of the stippled non-mass enhancement anterior to the biopsy-proven malignancy in the outer LEFT breast. BI-RADS CATEGORY  4: Suspicious. Electronically Signed   By: Evangeline Dakin M.D.   On: 06/16/2020 14:24   ECHOCARDIOGRAM COMPLETE  Result Date: 06/13/2020    ECHOCARDIOGRAM REPORT   Patient Name:   HAFSAH HENDLER Date of Exam: 06/13/2020 Medical Rec #:  [id number]          Height:       63.5 in Accession #:    [id number]         Weight:       159.0 lb Date of Birth:  02/02/1986         BSA:          1.764 m Patient Age:    33 years           BP:           141/84 mmHg Patient Gender: F                  HR:           77 bpm. Exam Location:  Outpatient Procedure: 2D Echo Indications:    Chemotherapy Evaluation V58.11  History:        Patient has no prior history of Echocardiogram examinations.                 Breast Cancer.  Sonographer:    Mikki Santee RDCS (AE) Referring Phys: [place]  1. Left ventricular ejection fraction, by estimation, is 60 to 65%. The left ventricle has normal function. The left ventricle has no regional wall motion abnormalities. There is mild left ventricular hypertrophy. Left ventricular diastolic parameters were normal. The average left ventricular global longitudinal strain is -21.5 %.  2. Right ventricular systolic function is normal. The right ventricular size is normal. Tricuspid regurgitation signal is inadequate for assessing PA pressure.  3. The mitral valve is normal in structure. Trivial mitral valve regurgitation.  4. The aortic valve is tricuspid. Aortic valve regurgitation is not visualized. No aortic  stenosis is present.  5. The inferior vena cava is normal in size with greater than 50% respiratory variability, suggesting right atrial pressure of 3 mmHg. FINDINGS  Left Ventricle: Left ventricular ejection fraction, by estimation, is 60 to 65%. The  left ventricle has normal function. The left ventricle has no regional wall motion abnormalities. The average left ventricular global longitudinal strain is -21.5 %. The left ventricular internal cavity size was normal in size. There is mild left ventricular hypertrophy. Left ventricular diastolic parameters were normal. Right Ventricle: The right ventricular size is normal. No increase in right ventricular wall thickness. Right ventricular systolic function is normal. Tricuspid regurgitation signal is inadequate for assessing PA pressure. Left Atrium: Left atrial size was normal in size. Right Atrium: Right atrial size was normal in size. Pericardium: There is no evidence of pericardial effusion. Mitral Valve: The mitral valve is normal in structure. Trivial mitral valve regurgitation. Tricuspid Valve: The tricuspid valve is normal in structure. Tricuspid valve regurgitation is not demonstrated. Aortic Valve: The aortic valve is tricuspid. Aortic valve regurgitation is not visualized. No aortic stenosis is present. Pulmonic Valve: The pulmonic valve was not well visualized. Pulmonic valve regurgitation is not visualized. Aorta: The aortic root and ascending aorta are structurally normal, with no evidence of dilitation. Venous: The inferior vena cava is normal in size with greater than 50% respiratory variability, suggesting right atrial pressure of 3 mmHg. IAS/Shunts: The interatrial septum was not well visualized.  LEFT VENTRICLE PLAX 2D LVIDd:         4.30 cm  Diastology LVIDs:         2.50 cm  LV e' lateral:   13.30 cm/s LV PW:         1.00 cm  LV E/e' lateral: 6.4 LV IVS:        0.90 cm  LV e' medial:    12.40 cm/s LVOT diam:     1.80 cm  LV E/e' medial:  6.9 LV  SV:         55 LV SV Index:   31       2D Longitudinal Strain LVOT Area:     2.54 cm 2D Strain GLS Avg:     -21.5 %  RIGHT VENTRICLE RV S prime:     11.20 cm/s TAPSE (M-mode): 2.3 cm LEFT ATRIUM             Index       RIGHT ATRIUM           Index LA diam:        3.70 cm 2.10 cm/m  RA Area:     14.80 cm LA Vol (A2C):   27.2 ml 15.42 ml/m RA Volume:   37.00 ml  20.97 ml/m LA Vol (A4C):   22.5 ml 12.75 ml/m LA Biplane Vol: 26.4 ml 14.97 ml/m  AORTIC VALVE LVOT Vmax:   111.00 cm/s LVOT Vmean:  72.100 cm/s LVOT VTI:    0.218 m  AORTA Ao Root diam: 2.50 cm MITRAL VALVE MV Area (PHT): 2.99 cm    SHUNTS MV Decel Time: 254 msec    Systemic VTI:  0.22 m MV E velocity: 85.30 cm/s  Systemic Diam: 1.80 cm MV A velocity: 73.30 cm/s MV E/A ratio:  1.16 Oswaldo Milian MD Electronically signed by Oswaldo Milian MD Signature Date/Time: 06/13/2020/12:23:29 PM    Final    [place] AXILLARY NODE CORE BIOPSY LEFT  Addendum Date: 05/30/2020   ADDENDUM REPORT: 05/30/2020 14:54 ADDENDUM: Pathology revealed GRADE III INVASIVE DUCTAL CARCINOMA of the LEFT breast, 2:30 o'clock. This was found to be concordant by Dr. Claudie Revering. Pathology revealed ONE LYMPH NODE NEGATIVE FOR CARCINOMA(0/1) of the LEFT axilla. This was found to be concordant by Dr. Remo Lipps  Joneen Caraway. Pathology results were discussed with the patient by telephone. The patient reported doing well after the biopsies with tenderness at the sites. Post biopsy instructions and care were reviewed and questions were answered. The patient was encouraged to call [facility] for any additional concerns. The patient was referred to [facility] at [facility] on June 04, 2020. Pathology results reported by Stacie Acres RN on 05/30/2020. Electronically Signed   By: Claudie Revering M.D.   On: 05/30/2020 14:54   Result Date: 05/30/2020 CLINICAL DATA:  2.7 cm palpable mass in the 2:30 o'clock  position of the left breast, 10 cm from the nipple, with recent imaging features suspicious for malignancy. The patient also had a left axillary lymph node with focal, eccentric cortical thickening that was suspicious for a metastatic node. EXAM: ULTRASOUND GUIDED LEFT BREAST CORE NEEDLE BIOPSY ULTRASOUND-GUIDED LEFT AXILLARY CORE NEEDLE BIOPSY COMPARISON:  Bilateral diagnostic mammogram and left breast ultrasound obtained at [facility] on 05/12/2020. PROCEDURE: I met with the patient and we discussed the procedure of ultrasound-guided biopsy, including benefits and alternatives. We discussed the high likelihood of a successful procedure. We discussed the risks of the procedures, including infection, bleeding, tissue injury, clip migration, and inadequate sampling. Informed written consent was given. The usual time-out protocol was performed immediately prior to the procedures. SITE 1: 0.7 CM MASS IN THE 2:30 O'CLOCK POSITION OF THE LEFT BREAST Lesion quadrant: Upper outer quadrant Using sterile technique and 1% Lidocaine as local anesthetic, under direct ultrasound visualization, a 12 gauge spring-loaded device was used to perform biopsy of the recently demonstrated 2.7 cm mass in the 2:30 o'clock position of the left breast, 10 cm from the nipple, using a caudal approach. At the conclusion of the procedure a ribbon shaped tissue marker clip was deployed into the biopsy cavity. Follow up 2 view mammogram was performed and dictated separately. SITE 2: SUSPICIOUS LEFT AXILLARY LYMPH NODE Using sterile technique and 1% Lidocaine as local anesthetic, under direct ultrasound visualization, a 14 gauge spring-loaded device was used to perform biopsy of the recently demonstrated left axillary lymph node with focal, eccentric cortical thickening using an inferolateral approach. At the conclusion of the procedure a Q shaped tissue marker clip was deployed into the biopsy cavity. Follow up 2 view mammogram was  performed and dictated separately. IMPRESSION: Ultrasound guided biopsy of the recently demonstrated 2.7 cm mass in the 2:30 o'clock position of the left breast in the recently demonstrated suspicious left axillary lymph node. No apparent complications. Electronically Signed: By: Claudie Revering M.D. On: 05/29/2020 14:33   MM CLIP PLACEMENT LEFT  Result Date: 05/29/2020 CLINICAL DATA:  Status post ultrasound-guided core needle biopsies of a 2.7 cm mass in the 2:30 o'clock position of the left breast and suspicious left axillary lymph node. EXAM: DIAGNOSTIC LEFT MAMMOGRAM POST ULTRASOUND BIOPSY X 2 COMPARISON:  Previous exam(s). FINDINGS: Mammographic images were obtained following ultrasound guided biopsy of the recently demonstrated 2.7 cm mass in the 2:30 o'clock position of the left breast and recently demonstrated suspicious left axillary lymph node. The biopsy marking clips are in expected positions at the sites of biopsy. IMPRESSION: Appropriate positioning of the ribbon shaped biopsy marking clip at the site of biopsy in the biopsied mass in the 2:30 o'clock position of the left breast. The Q shaped biopsy marker clip is at the expected location of the biopsied left axillary lymph node. Final Assessment: Post Procedure  Mammograms for Marker Placement Electronically Signed   By: Claudie Revering M.D.   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 This was found to be concordant by Dr. Claudie Revering. Pathology revealed ONE LYMPH NODE NEGATIVE FOR CARCINOMA(0/1) of the LEFT axilla. This was found to be concordant by Dr. Claudie Revering. Pathology results were discussed with the patient by telephone. The patient reported doing well after the biopsies with tenderness at the sites. Post biopsy instructions and care were  reviewed and questions were answered. The patient was encouraged to call [facility] for any additional concerns. The patient was referred to [facility] on June 04, 2020. Pathology results reported by Stacie Acres RN on 05/30/2020. Electronically Signed   By: Claudie Revering M.D.   On: 05/30/2020 14:54   Result Date: 05/30/2020 CLINICAL DATA:  2.7 cm palpable mass in the 2:30 o'clock position of the left breast, 10 cm from the nipple, with recent imaging features suspicious for malignancy. The patient also had a left axillary lymph node with focal, eccentric cortical thickening that was suspicious for a metastatic node. EXAM: ULTRASOUND GUIDED LEFT BREAST CORE NEEDLE BIOPSY ULTRASOUND-GUIDED LEFT AXILLARY CORE NEEDLE BIOPSY COMPARISON:  Bilateral diagnostic mammogram and left breast ultrasound obtained at [facility] on 05/12/2020. PROCEDURE: I met with the patient and we discussed the procedure of ultrasound-guided biopsy, including benefits and alternatives. We discussed the high likelihood of a successful procedure. We discussed the risks of the procedures, including infection, bleeding, tissue injury, clip migration, and inadequate sampling. Informed written consent was given. The usual time-out protocol was performed immediately prior to the procedures. SITE 1: 0.7 CM MASS IN THE 2:30 O'CLOCK POSITION OF THE LEFT BREAST Lesion quadrant: Upper outer quadrant Using sterile technique and 1% Lidocaine as local anesthetic, under direct ultrasound visualization, a 12 gauge spring-loaded device was used to perform biopsy of the recently demonstrated 2.7 cm mass in the 2:30 o'clock position of the left breast, 10 cm from the nipple, using a caudal approach. At the conclusion of the procedure a ribbon shaped tissue marker clip was deployed into the biopsy cavity. Follow up 2 view mammogram was performed and  dictated separately. SITE 2: SUSPICIOUS LEFT AXILLARY LYMPH NODE Using sterile technique and 1% Lidocaine as local anesthetic, under direct ultrasound visualization, a 14 gauge spring-loaded device was used to perform biopsy of the recently demonstrated left axillary lymph node with focal, eccentric cortical thickening using an inferolateral approach. At the conclusion of the procedure a Q shaped tissue marker clip was deployed into the biopsy cavity. Follow up 2 view mammogram was performed and dictated separately. IMPRESSION: Ultrasound guided biopsy of the recently demonstrated 2.7 cm mass in the 2:30 o'clock position of the left breast in the recently demonstrated suspicious left axillary lymph node. No apparent complications. Electronically Signed: By: Claudie Revering M.D. On: 05/29/2020 14:33     ELIGIBLE FOR AVAILABLE RESEARCH PROTOCOL: no  ASSESSMENT: 34 y.o. [place] woman status post left breast upper outer quadrant biopsy 05/29/2020 for a clinical T2 N0, stage IIB invasive ductal carcinoma, functionally triple negative, with an MIB-1 of 95%.  (1) genetics testing in process  (2) neoadjuvant chemotherapy will consist of cyclophosphamide and doxorubicin  in dose dense fashion x4 starting 06/19/2020, to be followed by paclitaxel and carboplatin weekly x12  (3) definitive surgery to follow  (4) postmastectomy radiation as appropriate   PLAN: I went over Jeanne Evans's situation with her in detail.  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  This is not an infectious or neoplastic condition.  It is a poorly understood reactive process which in many cases needs only observation.  She has been asymptomatic from this so I think we will simply keep an eye on it and no specific intervention is needed.  I showed her an image of her breast MRI.  She will need an additional MRI guided biopsy to find out exactly what the nonmasslike enhancement seen  adjacent to her mass is.  Hopefully we can get that done sometime next week.  She will have her port placed tomorrow.  We discussed the fact that she will have some soreness and the needle will be kept in so she does not have to be reaccessed on the following day.  She brought some FMLA papers which I believe are still pending.  We have asked the nurse taking care of that issue to give her a call to update  She tells me she has paid for a celebration for her parents wedding anniversary August 21 and she really does not want to feel sick that day and she cannot get her money back.  Accordingly we are changing her 2nd treatment from August 19 to August 24 and the subsequent treatment will be every 2 weeks after that so she will be getting treated on Tuesdays not on Thursdays after the 1st treatment.  Finally I have made an appointment for her to see my nurse practitioner on August 13 to troubleshoot any problems that may have developed from cycle #1 and improve things with cycle #2  She knows to call us for any other issue that may develop before the next visit.  Total encounter time 35 minutes.Sarajane Jews C. Ruchel Brandenburger, MD 06/17/2020 10:34 AM Medical Oncology and Hematology [facility] [place], [place] [address] Tel. [phone]    Fax. [phone]   This document serves as a record of services personally performed by Lurline Del, MD. It was created on his behalf by Wilburn Mylar, a trained medical scribe. The creation of this record is based on the scribe's personal observations and the provider's statements to them.   I, Lurline Del MD, have reviewed the above documentation for accuracy and completeness, and I agree with the above.   *Total Encounter Time as defined by the Centers for Medicare and Medicaid Services includes, in addition to the face-to-face time of a patient visit (documented in the note above) non-face-to-face time: obtaining and  reviewing outside history, ordering and reviewing medications, tests or procedures, care coordination (communications with other health care professionals or caregivers) and documentation in the medical record.

## 2020-06-17 ENCOUNTER — Telehealth: Payer: Self-pay

## 2020-06-17 ENCOUNTER — Inpatient Hospital Stay (HOSPITAL_BASED_OUTPATIENT_CLINIC_OR_DEPARTMENT_OTHER): Payer: 59 | Admitting: Oncology

## 2020-06-17 ENCOUNTER — Encounter: Payer: Self-pay | Admitting: *Deleted

## 2020-06-17 ENCOUNTER — Other Ambulatory Visit: Payer: Self-pay

## 2020-06-17 ENCOUNTER — Encounter: Payer: Self-pay | Admitting: Oncology

## 2020-06-17 VITALS — BP 138/78 | HR 75 | Temp 98.7°F | Resp 20 | Ht 63.5 in | Wt 161.0 lb

## 2020-06-17 DIAGNOSIS — K769 Liver disease, unspecified: Secondary | ICD-10-CM | POA: Diagnosis not present

## 2020-06-17 DIAGNOSIS — Z8042 Family history of malignant neoplasm of prostate: Secondary | ICD-10-CM | POA: Diagnosis not present

## 2020-06-17 DIAGNOSIS — C50412 Malignant neoplasm of upper-outer quadrant of left female breast: Secondary | ICD-10-CM | POA: Diagnosis present

## 2020-06-17 DIAGNOSIS — D869 Sarcoidosis, unspecified: Secondary | ICD-10-CM | POA: Diagnosis not present

## 2020-06-17 DIAGNOSIS — Z170 Estrogen receptor positive status [ER+]: Secondary | ICD-10-CM | POA: Diagnosis not present

## 2020-06-17 DIAGNOSIS — Z8041 Family history of malignant neoplasm of ovary: Secondary | ICD-10-CM | POA: Diagnosis not present

## 2020-06-17 DIAGNOSIS — Z5111 Encounter for antineoplastic chemotherapy: Secondary | ICD-10-CM | POA: Diagnosis present

## 2020-06-17 DIAGNOSIS — Z79899 Other long term (current) drug therapy: Secondary | ICD-10-CM | POA: Diagnosis not present

## 2020-06-17 DIAGNOSIS — Z171 Estrogen receptor negative status [ER-]: Secondary | ICD-10-CM | POA: Diagnosis not present

## 2020-06-17 DIAGNOSIS — Z7952 Long term (current) use of systemic steroids: Secondary | ICD-10-CM | POA: Diagnosis not present

## 2020-06-17 DIAGNOSIS — Z801 Family history of malignant neoplasm of trachea, bronchus and lung: Secondary | ICD-10-CM | POA: Diagnosis not present

## 2020-06-17 DIAGNOSIS — J45909 Unspecified asthma, uncomplicated: Secondary | ICD-10-CM | POA: Diagnosis not present

## 2020-06-17 DIAGNOSIS — K30 Functional dyspepsia: Secondary | ICD-10-CM | POA: Diagnosis not present

## 2020-06-17 DIAGNOSIS — Z793 Long term (current) use of hormonal contraceptives: Secondary | ICD-10-CM | POA: Diagnosis not present

## 2020-06-17 MED ORDER — CHLORHEXIDINE GLUCONATE CLOTH 2 % EX PADS: 6.0000 | MEDICATED_PAD | Freq: Once | CUTANEOUS | Status: DC

## 2020-06-17 NOTE — Progress Notes (Signed)
Met with patient at registration to introduce myself as Financial Resource Specialist and to offer available resources. ° °Discussed one-time $1000 [employer] grant and qualifications to assist with personal expenses while going through treatment. ° °Gave her my card if interested in applying and for any additional financial questions or concerns.  °

## 2020-06-17 NOTE — Telephone Encounter (Signed)
Nutrition  Patient identified by attending Breast Clinic on 06/04/20.  Patient was given nutrition packet by nurse navigator with RD contact information.  Chart reviewed  Planning neoadjuvant chemotherapy, surgery, radiation.  Called patient to introduce self and service at [facility].  No answer.  Left message with call back number.  Dallana Mavity B. Zenia Resides, [place], [place] Registered Dietitian [phone] (mobile)

## 2020-06-17 NOTE — Progress Notes (Signed)

## 2020-06-18 ENCOUNTER — Encounter (HOSPITAL_BASED_OUTPATIENT_CLINIC_OR_DEPARTMENT_OTHER): Payer: Self-pay | Admitting: Surgery

## 2020-06-18 ENCOUNTER — Telehealth: Payer: Self-pay | Admitting: Oncology

## 2020-06-18 ENCOUNTER — Ambulatory Visit (HOSPITAL_COMMUNITY): Payer: 59

## 2020-06-18 ENCOUNTER — Ambulatory Visit (HOSPITAL_BASED_OUTPATIENT_CLINIC_OR_DEPARTMENT_OTHER): Payer: 59 | Admitting: Anesthesiology

## 2020-06-18 ENCOUNTER — Encounter (HOSPITAL_BASED_OUTPATIENT_CLINIC_OR_DEPARTMENT_OTHER)
Admission: RE | Disposition: A | Discharge status: Home / Self Care | Payer: Self-pay | Source: Home / Self Care | Attending: Surgery

## 2020-06-18 ENCOUNTER — Other Ambulatory Visit: Payer: Self-pay

## 2020-06-18 ENCOUNTER — Ambulatory Visit (HOSPITAL_BASED_OUTPATIENT_CLINIC_OR_DEPARTMENT_OTHER)
Admission: RE | Admit: 2020-06-18 | Discharge: 2020-06-18 | Disposition: A | Discharge status: Home / Self Care | Payer: 59 | Attending: Surgery | Admitting: Surgery

## 2020-06-18 DIAGNOSIS — C50912 Malignant neoplasm of unspecified site of left female breast: Secondary | ICD-10-CM | POA: Insufficient documentation

## 2020-06-18 DIAGNOSIS — Z79899 Other long term (current) drug therapy: Secondary | ICD-10-CM | POA: Diagnosis not present

## 2020-06-18 DIAGNOSIS — Z95828 Presence of other vascular implants and grafts: Secondary | ICD-10-CM

## 2020-06-18 DIAGNOSIS — J45909 Unspecified asthma, uncomplicated: Secondary | ICD-10-CM | POA: Insufficient documentation

## 2020-06-18 DIAGNOSIS — Z452 Encounter for adjustment and management of vascular access device: Secondary | ICD-10-CM | POA: Diagnosis present

## 2020-06-18 DIAGNOSIS — Z170 Estrogen receptor positive status [ER+]: Secondary | ICD-10-CM | POA: Diagnosis not present

## 2020-06-18 DIAGNOSIS — Z7951 Long term (current) use of inhaled steroids: Secondary | ICD-10-CM | POA: Diagnosis not present

## 2020-06-18 HISTORY — PX: PORTACATH PLACEMENT: SHX2246

## 2020-06-18 LAB — POCT PREGNANCY, URINE: Preg Test, Ur: NEGATIVE

## 2020-06-18 SURGERY — INSERTION, TUNNELED CENTRAL VENOUS DEVICE, WITH PORT
Anesthesia: General | Site: Chest | Laterality: Right

## 2020-06-18 MED ORDER — LIDOCAINE HCL (CARDIAC) PF 100 MG/5ML IV SOSY
PREFILLED_SYRINGE | INTRAVENOUS | Status: DC | PRN
  Administered 2020-06-18: 60 mg via INTRAVENOUS

## 2020-06-18 MED ORDER — ONDANSETRON HCL 4 MG/2ML IJ SOLN
INTRAMUSCULAR | Status: DC | PRN
  Administered 2020-06-18: 4 mg via INTRAVENOUS

## 2020-06-18 MED ORDER — CELECOXIB 200 MG PO CAPS
ORAL_CAPSULE | ORAL | Status: AC
  Filled 2020-06-18: qty 1

## 2020-06-18 MED ORDER — PROPOFOL 500 MG/50ML IV EMUL
INTRAVENOUS | Status: DC | PRN
  Administered 2020-06-18: 25 ug/kg/min via INTRAVENOUS

## 2020-06-18 MED ORDER — PROPOFOL 10 MG/ML IV BOLUS: INTRAVENOUS | Status: DC | PRN

## 2020-06-18 MED ORDER — FENTANYL CITRATE (PF) 100 MCG/2ML IJ SOLN
INTRAMUSCULAR | Status: AC
  Filled 2020-06-18: qty 2

## 2020-06-18 MED ORDER — PROMETHAZINE HCL 25 MG/ML IJ SOLN: 6.2500 mg | INTRAMUSCULAR | Status: DC | PRN

## 2020-06-18 MED ORDER — FENTANYL CITRATE (PF) 100 MCG/2ML IJ SOLN
INTRAMUSCULAR | Status: DC | PRN
  Administered 2020-06-18: 50 ug via INTRAVENOUS
  Administered 2020-06-18: 25 ug via INTRAVENOUS
  Administered 2020-06-18: 50 ug via INTRAVENOUS

## 2020-06-18 MED ORDER — IBUPROFEN 800 MG PO TABS: 800.0000 mg | ORAL_TABLET | Freq: Three times a day (TID) | ORAL | 0 refills | Status: DC | PRN

## 2020-06-18 MED ORDER — OXYCODONE HCL 5 MG PO TABS
5.0000 mg | ORAL_TABLET | Freq: Four times a day (QID) | ORAL | 0 refills | Status: DC | PRN
Start: 2020-06-18 — End: 2020-07-08

## 2020-06-18 MED ORDER — OXYCODONE HCL 5 MG PO TABS
5.0000 mg | ORAL_TABLET | Freq: Once | ORAL | Status: AC | PRN
  Administered 2020-06-18: 5 mg via ORAL

## 2020-06-18 MED ORDER — CEFAZOLIN SODIUM-DEXTROSE 2-4 GM/100ML-% IV SOLN
2.0000 g | INTRAVENOUS | Status: AC
  Administered 2020-06-18: 2 g via INTRAVENOUS

## 2020-06-18 MED ORDER — ACETAMINOPHEN 500 MG PO TABS
ORAL_TABLET | ORAL | Status: AC
  Filled 2020-06-18: qty 2

## 2020-06-18 MED ORDER — LACTATED RINGERS IV SOLN: INTRAVENOUS | Status: DC

## 2020-06-18 MED ORDER — OXYCODONE HCL 5 MG PO TABS
ORAL_TABLET | ORAL | Status: AC
  Filled 2020-06-18: qty 1

## 2020-06-18 MED ORDER — BUPIVACAINE HCL 0.25 % IJ SOLN
INTRAMUSCULAR | Status: DC | PRN
  Administered 2020-06-18: 10 mL

## 2020-06-18 MED ORDER — MEPERIDINE HCL 25 MG/ML IJ SOLN: 6.2500 mg | INTRAMUSCULAR | Status: DC | PRN

## 2020-06-18 MED ORDER — CEFAZOLIN SODIUM-DEXTROSE 2-4 GM/100ML-% IV SOLN
INTRAVENOUS | Status: AC
  Filled 2020-06-18: qty 100

## 2020-06-18 MED ORDER — HEPARIN SOD (PORK) LOCK FLUSH 100 UNIT/ML IV SOLN
INTRAVENOUS | Status: DC | PRN
  Administered 2020-06-18: 500 [IU] via INTRAVENOUS

## 2020-06-18 MED ORDER — OXYCODONE HCL 5 MG/5ML PO SOLN: 5.0000 mg | Freq: Once | ORAL | Status: AC | PRN

## 2020-06-18 MED ORDER — MIDAZOLAM HCL 2 MG/2ML IJ SOLN
INTRAMUSCULAR | Status: AC
  Filled 2020-06-18: qty 2

## 2020-06-18 MED ORDER — ACETAMINOPHEN 500 MG PO TABS
1000.0000 mg | ORAL_TABLET | ORAL | Status: AC
  Administered 2020-06-18: 1000 mg via ORAL

## 2020-06-18 MED ORDER — DEXAMETHASONE SODIUM PHOSPHATE 4 MG/ML IJ SOLN
INTRAMUSCULAR | Status: DC | PRN
  Administered 2020-06-18: 5 mg via INTRAVENOUS

## 2020-06-18 MED ORDER — CELECOXIB 200 MG PO CAPS
200.0000 mg | ORAL_CAPSULE | ORAL | Status: AC
  Administered 2020-06-18: 200 mg via ORAL

## 2020-06-18 MED ORDER — GABAPENTIN 300 MG PO CAPS
ORAL_CAPSULE | ORAL | Status: AC
  Filled 2020-06-18: qty 1

## 2020-06-18 MED ORDER — AMISULPRIDE (ANTIEMETIC) 5 MG/2ML IV SOLN: 10.0000 mg | Freq: Once | INTRAVENOUS | Status: DC | PRN

## 2020-06-18 MED ORDER — HEPARIN (PORCINE) IN NACL 2-0.9 UNITS/ML
INTRAMUSCULAR | Status: AC | PRN
  Administered 2020-06-18: 1 via INTRAVENOUS

## 2020-06-18 MED ORDER — HYDROMORPHONE HCL 1 MG/ML IJ SOLN: 0.2500 mg | INTRAMUSCULAR | Status: DC | PRN

## 2020-06-18 MED ORDER — PROPOFOL 10 MG/ML IV BOLUS
INTRAVENOUS | Status: DC | PRN
  Administered 2020-06-18: 200 mg via INTRAVENOUS

## 2020-06-18 MED ORDER — MIDAZOLAM HCL 5 MG/5ML IJ SOLN
INTRAMUSCULAR | Status: DC | PRN
  Administered 2020-06-18: 2 mg via INTRAVENOUS

## 2020-06-18 MED ORDER — GABAPENTIN 300 MG PO CAPS
300.0000 mg | ORAL_CAPSULE | ORAL | Status: AC
  Administered 2020-06-18: 300 mg via ORAL

## 2020-06-18 SURGICAL SUPPLY — 56 items
ADH SKN CLS APL DERMABOND .7 (GAUZE/BANDAGES/DRESSINGS) ×1
APL PRP STRL LF DISP 70% ISPRP (MISCELLANEOUS) ×1
APL SKNCLS STERI-STRIP NONHPOA (GAUZE/BANDAGES/DRESSINGS)
BAG DECANTER FOR FLEXI CONT (MISCELLANEOUS) ×2 IMPLANT
BENZOIN TINCTURE PRP APPL 2/3 (GAUZE/BANDAGES/DRESSINGS) IMPLANT
BLADE HEX COATED 2.75 (ELECTRODE) ×2 IMPLANT
BLADE SURG 11 STRL SS (BLADE) ×2 IMPLANT
BLADE SURG 15 STRL LF DISP TIS (BLADE) ×1 IMPLANT
BLADE SURG 15 STRL SS (BLADE) ×2
CANISTER SUCT 1200ML W/VALVE (MISCELLANEOUS) IMPLANT
CHLORAPREP W/TINT 26 (MISCELLANEOUS) ×2 IMPLANT
COVER BACK TABLE 60X90IN (DRAPES) ×2 IMPLANT
COVER MAYO STAND STRL (DRAPES) ×2 IMPLANT
COVER PROBE 5X48 (MISCELLANEOUS) ×2
DECANTER SPIKE VIAL GLASS SM (MISCELLANEOUS) IMPLANT
DERMABOND ADVANCED (GAUZE/BANDAGES/DRESSINGS) ×1
DERMABOND ADVANCED .7 DNX12 (GAUZE/BANDAGES/DRESSINGS) ×1 IMPLANT
DRAPE C-ARM 42X72 X-RAY (DRAPES) ×2 IMPLANT
DRAPE LAPAROSCOPIC ABDOMINAL (DRAPES) ×2 IMPLANT
DRAPE UTILITY XL STRL (DRAPES) ×2 IMPLANT
DRSG TEGADERM 2-3/8X2-3/4 SM (GAUZE/BANDAGES/DRESSINGS) IMPLANT
DRSG TEGADERM 4X4.75 (GAUZE/BANDAGES/DRESSINGS) ×2 IMPLANT
ELECT REM PT RETURN 9FT ADLT (ELECTROSURGICAL) ×2
ELECTRODE REM PT RTRN 9FT ADLT (ELECTROSURGICAL) ×1 IMPLANT
GAUZE SPONGE 4X4 12PLY STRL LF (GAUZE/BANDAGES/DRESSINGS) ×2 IMPLANT
GLOVE BIO SURGEON STRL SZ 6.5 (GLOVE) ×2 IMPLANT
GLOVE BIOGEL PI IND STRL 8 (GLOVE) ×1 IMPLANT
GLOVE BIOGEL PI INDICATOR 8 (GLOVE) ×1
GLOVE ECLIPSE 8.0 STRL XLNG CF (GLOVE) ×2 IMPLANT
GOWN STRL REUS W/ TWL LRG LVL3 (GOWN DISPOSABLE) ×2 IMPLANT
GOWN STRL REUS W/TWL LRG LVL3 (GOWN DISPOSABLE) ×4
IV KIT MINILOC 20X1 SAFETY (NEEDLE) IMPLANT
KIT CVR 48X5XPRB PLUP LF (MISCELLANEOUS) ×1 IMPLANT
KIT PORT POWER 8FR ISP CVUE (Port) ×2 IMPLANT
NDL SAFETY ECLIPSE 18X1.5 (NEEDLE) IMPLANT
NEEDLE HYPO 18GX1.5 SHARP (NEEDLE)
NEEDLE HYPO 22GX1.5 SAFETY (NEEDLE) IMPLANT
NEEDLE HYPO 25X1 1.5 SAFETY (NEEDLE) ×2 IMPLANT
NEEDLE SPNL 22GX3.5 QUINCKE BK (NEEDLE) IMPLANT
PACK BASIN DAY SURGERY FS (CUSTOM PROCEDURE TRAY) ×2 IMPLANT
PENCIL SMOKE EVACUATOR (MISCELLANEOUS) ×2 IMPLANT
SET SHEATH INTRODUCER 10FR (MISCELLANEOUS) IMPLANT
SHEATH COOK PEEL AWAY SET 9F (SHEATH) IMPLANT
SLEEVE SCD COMPRESS KNEE MED (MISCELLANEOUS) ×2 IMPLANT
STRIP CLOSURE SKIN 1/2X4 (GAUZE/BANDAGES/DRESSINGS) IMPLANT
SUT MON AB 4-0 PC3 18 (SUTURE) ×2 IMPLANT
SUT PROLENE 2 0 CT2 30 (SUTURE) IMPLANT
SUT PROLENE 2 0 SH DA (SUTURE) ×2 IMPLANT
SUT SILK 2 0 TIES 17X18 (SUTURE)
SUT SILK 2-0 18XBRD TIE BLK (SUTURE) IMPLANT
SUT VICRYL 3-0 CR8 SH (SUTURE) ×2 IMPLANT
SYR 5ML LUER SLIP (SYRINGE) ×2 IMPLANT
SYR CONTROL 10ML LL (SYRINGE) ×2 IMPLANT
TOWEL GREEN STERILE FF (TOWEL DISPOSABLE) ×4 IMPLANT
TUBE CONNECTING 20X1/4 (TUBING) IMPLANT
YANKAUER SUCT BULB TIP NO VENT (SUCTIONS) IMPLANT

## 2020-06-18 NOTE — Interval H&P Note (Signed)
History and Physical Interval Note:  06/18/2020 10:22 AM  Jeanne Evans  has presented today for surgery, with the diagnosis of POOR VENOUS ACCESS.  The various methods of treatment have been discussed with the patient and family. After consideration of risks, benefits and other options for treatment, the patient has consented to  Procedure(s): INSERTION PORT-A-CATH WITH ULTRASOUND GUIDANCE (N/A) as a surgical intervention.  The patient's history has been reviewed, patient examined, no change in status, stable for surgery.  I have reviewed the patient's chart and labs.  Questions were answered to the patient's satisfaction.     [place]

## 2020-06-18 NOTE — Anesthesia Preprocedure Evaluation (Addendum)
Anesthesia Evaluation  Patient identified by MRN, date of birth, ID band Patient awake    Reviewed: Allergy & Precautions, NPO status , Patient's Chart, lab work & pertinent test results  History of Anesthesia Complications Negative for: history of anesthetic complications  Airway Mallampati: II  TM Distance: >3 FB Neck ROM: Full    Dental no notable dental hx.    Pulmonary neg pulmonary ROS, asthma ,    Pulmonary exam normal breath sounds clear to auscultation       Cardiovascular negative cardio ROS Normal cardiovascular exam Rhythm:Regular Rate:Normal     Neuro/Psych negative neurological ROS  negative psych ROS   GI/Hepatic negative GI ROS, Neg liver ROS,   Endo/Other  negative endocrine ROS  Renal/GU negative Renal ROS  negative genitourinary   Musculoskeletal negative musculoskeletal ROS (+)   Abdominal   Peds negative pediatric ROS (+)  Hematology negative hematology ROS (+)   Anesthesia Other Findings Covid test negative Breast Cancer   Reproductive/Obstetrics negative OB ROS  Breast cancer                             Anesthesia Physical Anesthesia Plan  ASA: III  Anesthesia Plan: General   Post-op Pain Management:    Induction: Intravenous  PONV Risk Score and Plan: 3 and Treatment may vary due to age or medical condition, Ondansetron, Dexamethasone and Midazolam  Airway Management Planned: LMA  Additional Equipment: None  Intra-op Plan:   Post-operative Plan: Extubation in OR  Informed Consent:   Plan Discussed with: CRNA and Anesthesiologist  Anesthesia Plan Comments:        Anesthesia Quick Evaluation

## 2020-06-18 NOTE — Discharge Instructions (Signed)
PORT-A-CATH: POST OP INSTRUCTIONS  Always review your discharge instruction sheet given to you by the facility where your surgery was performed.   1. A prescription for pain medication may be given to you upon discharge. Take your pain medication as prescribed, if needed. If narcotic pain medicine is not needed, then you make take acetaminophen (Tylenol) or ibuprofen (Advil) as needed.  2. Take your usually prescribed medications unless otherwise directed. 3. If you need a refill on your pain medication, please contact our office. All narcotic pain medicine now requires a paper prescription.  Phoned in and fax refills are no longer allowed by law.  Prescriptions will not be filled after 5 pm or on weekends.  4. You should follow a light diet for the remainder of the day after your procedure. 5. Most patients will experience some mild swelling and/or bruising in the area of the incision. It may take several days to resolve. 6. It is common to experience some constipation if taking pain medication after surgery. Increasing fluid intake and taking a stool softener (such as Colace) will usually help or prevent this problem from occurring. A mild laxative (Milk of Magnesia or Miralax) should be taken according to package directions if there are no bowel movements after 48 hours.  7. Unless discharge instructions indicate otherwise, you may remove your bandages 48 hours after surgery, and you may shower at that time. You may have steri-strips (small white skin tapes) in place directly over the incision.  These strips should be left on the skin for 7-10 days.  If your surgeon used Dermabond (skin glue) on the incision, you may shower in 24 hours.  The glue will flake off over the next 2-3 weeks.  8. If your port is left accessed at the end of surgery (needle left in port), the dressing cannot get wet and should only by changed by a healthcare professional. When the port is no longer accessed (when the  needle has been removed), follow step 7.   9. ACTIVITIES:  Limit activity involving your arms for the next 72 hours. Do no strenuous exercise or activity for 1 week. You may drive when you are no longer taking prescription pain medication, you can comfortably wear a seatbelt, and you can maneuver your car. 10.You may need to see your doctor in the office for a follow-up appointment.  Please       check with your doctor.  11.When you receive a new Port-a-Cath, you will get a product guide and        ID card.  Please keep them in case you need them.  WHEN TO CALL YOUR DOCTOR [phone]): 1. Fever over 101.0 2. Chills 3. Continued bleeding from incision 4. Increased redness and tenderness at the site 5. Shortness of breath, difficulty breathing   The clinic staff is available to answer your questions during regular business hours. Please don't hesitate to call and ask to speak to one of the nurses or medical assistants for clinical concerns. If you have a medical emergency, go to the nearest emergency room or call 911.  A surgeon from [facility] is always on call at the hospital.     For further information, please visit [email or web address]    No Tylenol until 4:01 pm No Ibuprofen or Motrin until 6:01 pm  Post Anesthesia Home Care Instructions  Activity: Get plenty of rest for the remainder of the day. A responsible individual must stay with you for 24 hours  following the procedure.  For the next 24 hours, DO NOT: -Drive a car -Paediatric nurse -Drink alcoholic beverages -Take any medication unless instructed by your physician -Make any legal decisions or sign important papers.  Meals: Start with liquid foods such as gelatin or soup. Progress to regular foods as tolerated. Avoid greasy, spicy, heavy foods. If nausea and/or vomiting occur, drink only clear liquids until the nausea and/or vomiting subsides. Call your physician if vomiting  continues.  Special Instructions/Symptoms: Your throat may feel dry or sore from the anesthesia or the breathing tube placed in your throat during surgery. If this causes discomfort, gargle with warm salt water. The discomfort should disappear within 24 hours.  If you had a scopolamine patch placed behind your ear for the management of post- operative nausea and/or vomiting:  1. The medication in the patch is effective for 72 hours, after which it should be removed.  Wrap patch in a tissue and discard in the trash. Wash hands thoroughly with soap and water. 2. You may remove the patch earlier than 72 hours if you experience unpleasant side effects which may include dry mouth, dizziness or visual disturbances. 3. Avoid touching the patch. Wash your hands with soap and water after contact with the patch.

## 2020-06-18 NOTE — Anesthesia Procedure Notes (Signed)
Procedure Name: LMA Insertion Date/Time: 06/18/2020 10:51 AM Performed by: Signe Colt, CRNA Pre-anesthesia Checklist: Patient identified, Emergency Drugs available, Suction available and Patient being monitored Patient Re-evaluated:Patient Re-evaluated prior to induction Oxygen Delivery Method: Circle system utilized Preoxygenation: Pre-oxygenation with 100% oxygen Induction Type: IV induction Ventilation: Mask ventilation without difficulty LMA: LMA inserted LMA Size: 4.0 Number of attempts: 1 Airway Equipment and Method: Bite block Placement Confirmation: positive ETCO2 Tube secured with: Tape Dental Injury: Teeth and Oropharynx as per pre-operative assessment

## 2020-06-18 NOTE — Telephone Encounter (Signed)
Scheduled appts per 8/3 los. Left voicemail with appt date and time.

## 2020-06-18 NOTE — Transfer of Care (Signed)
Immediate Anesthesia Transfer of Care Note  Patient: Jeanne Evans  Procedure(s) Performed: INSERTION PORT-A-CATH WITH ULTRASOUND GUIDANCE (Right Chest)  Patient Location: PACU  Anesthesia Type:General  Level of Consciousness: awake, alert , oriented and patient cooperative  Airway & Oxygen Therapy: Patient Spontanous Breathing and Patient connected to face mask oxygen  Post-op Assessment: Report given to RN and Post -op Vital signs reviewed and stable  Post vital signs: Reviewed and stable  Last Vitals:  Vitals Value Taken Time  BP 143/90 06/18/20 1202  Temp    Pulse 87 06/18/20 1202  Resp    SpO2 100 % 06/18/20 1202    Last Pain:  Vitals:   06/18/20 0958  TempSrc: Oral  PainSc: 0-No pain         Complications: No complications documented.

## 2020-06-18 NOTE — Op Note (Signed)
Preoperative diagnosis: PAC needed  Postoperative diagnosis: Same  Procedure: Portacath Placement with C arm and U/S guidance   Surgeon: Turner Daniels, MD, FACS  Anesthesia: General and 0.25 % marcaine with epinephrine  Clinical History and Indications: The patient is getting ready to begin chemotherapy for her cancer. She  needs a Port-A-Cath for venous access. Risk of bleeding, infection,  Collapse lung,  Death,  DVT,  Organ injury,  Mediastinal injury,  Injury to heart,  Injury to blood vessels,  Nerves,  Migration of catheter,  Embolization of catheter and the need for more surgery.  Description of Procedure: I have seen the patient in the holding area and confirmed the plans for the procedure as noted above. I reviewed the risks and complications again and the patient has no further questions. She wishes to proceed.   The patient was then taken to the operating room. After satisfactory general  anesthesia had been obtained the upper chest and lower neck were prepped and draped as a sterile field. The timeout was done.  The right internal jugular vein  was entered under U/S guidance  and the guidewire threaded into the superior vena cava right atrial area under fluoroscopic guidance. An incision was then made on the anterior chest wall and a subcutaneous pocket fashioned for the port reservoir.  The port tubing was then brought through a subcutaneous tunnel from the port site to the guidewire site.  The port and catheter were attached, locked  and flushed. The catheter was measured and cut to appropriate length.The dilator and peel-away sheath were then advanced over the guidewire while monitoring this with fluoroscopy. The guidewire and dilator were removed and the tubing threaded to approximately 20 cm. The peel-away sheath was then removed. The catheter aspirated and flushed easily. Using fluoroscopy the tip was in the superior vena cava right atrial junction area. It aspirated and  flushed easily. That aspirated and flushed easily.  The reservoir was secured to the fascia with 2 sutures of 2-0 Prolene. A final check with fluoroscopy was done to make sure we had no kinks and good positioning of the tip of the catheter. Everything appeared to be okay. The catheter was aspirated, flushed with dilute heparin and then concentrated aqueous heparin.  The incision was then closed with interrupted 3-0 Vicryl, and 4-0 Monocryl subcuticular with Dermabond on the skin.  There were no operative complications. Estimated blood loss was minimal. All counts were correct. The patient tolerated the procedure well.  Turner Daniels, MD, FACS

## 2020-06-19 ENCOUNTER — Inpatient Hospital Stay: Payer: 59

## 2020-06-19 ENCOUNTER — Encounter (HOSPITAL_BASED_OUTPATIENT_CLINIC_OR_DEPARTMENT_OTHER): Payer: Self-pay | Admitting: Surgery

## 2020-06-19 ENCOUNTER — Other Ambulatory Visit: Payer: Self-pay | Admitting: Oncology

## 2020-06-19 ENCOUNTER — Other Ambulatory Visit: Payer: Self-pay | Admitting: *Deleted

## 2020-06-19 ENCOUNTER — Telehealth: Payer: Self-pay | Admitting: *Deleted

## 2020-06-19 ENCOUNTER — Encounter: Payer: Self-pay | Admitting: Oncology

## 2020-06-19 ENCOUNTER — Other Ambulatory Visit: Payer: Self-pay

## 2020-06-19 ENCOUNTER — Encounter: Payer: Self-pay | Admitting: *Deleted

## 2020-06-19 VITALS — BP 121/75 | HR 72 | Temp 98.4°F | Resp 17

## 2020-06-19 DIAGNOSIS — C50412 Malignant neoplasm of upper-outer quadrant of left female breast: Secondary | ICD-10-CM

## 2020-06-19 DIAGNOSIS — Z170 Estrogen receptor positive status [ER+]: Secondary | ICD-10-CM

## 2020-06-19 DIAGNOSIS — Z5111 Encounter for antineoplastic chemotherapy: Secondary | ICD-10-CM | POA: Diagnosis not present

## 2020-06-19 LAB — CBC WITH DIFFERENTIAL/PLATELET
Abs Immature Granulocytes: 0.05 10*3/uL (ref 0.00–0.07)
Basophils Absolute: 0 10*3/uL (ref 0.0–0.1)
Basophils Relative: 0 %
Eosinophils Absolute: 0 10*3/uL (ref 0.0–0.5)
Eosinophils Relative: 0 %
HCT: 34.4 % — ABNORMAL LOW (ref 36.0–46.0)
Hemoglobin: 11.5 g/dL — ABNORMAL LOW (ref 12.0–15.0)
Immature Granulocytes: 0 %
Lymphocytes Relative: 20 %
Lymphs Abs: 2.6 10*3/uL (ref 0.7–4.0)
MCH: 29.6 pg (ref 26.0–34.0)
MCHC: 33.4 g/dL (ref 30.0–36.0)
MCV: 88.7 fL (ref 80.0–100.0)
Monocytes Absolute: 0.8 10*3/uL (ref 0.1–1.0)
Monocytes Relative: 6 %
Neutro Abs: 9.1 10*3/uL — ABNORMAL HIGH (ref 1.7–7.7)
Neutrophils Relative %: 74 %
Platelets: 307 10*3/uL (ref 150–400)
RBC: 3.88 MIL/uL (ref 3.87–5.11)
RDW: 14.1 % (ref 11.5–15.5)
WBC: 12.5 10*3/uL — ABNORMAL HIGH (ref 4.0–10.5)
nRBC: 0 % (ref 0.0–0.2)

## 2020-06-19 LAB — COMPREHENSIVE METABOLIC PANEL
ALT: 9 U/L (ref 0–44)
AST: 11 U/L — ABNORMAL LOW (ref 15–41)
Albumin: 3.8 g/dL (ref 3.5–5.0)
Alkaline Phosphatase: 53 U/L (ref 38–126)
Anion gap: 9 (ref 5–15)
BUN: 11 mg/dL (ref 6–20)
CO2: 22 mmol/L (ref 22–32)
Calcium: 9.4 mg/dL (ref 8.9–10.3)
Chloride: 106 mmol/L (ref 98–111)
Creatinine, Ser: 0.89 mg/dL (ref 0.44–1.00)
GFR calc Af Amer: >60 mL/min (ref >60)
GFR calc non Af Amer: >60 mL/min (ref >60)
Glucose, Bld: 147 mg/dL — ABNORMAL HIGH (ref 70–99)
Potassium: 3.4 mmol/L — ABNORMAL LOW (ref 3.5–5.1)
Sodium: 137 mmol/L (ref 135–145)
Total Bilirubin: 0.4 mg/dL (ref 0.3–1.2)
Total Protein: 7.4 g/dL (ref 6.5–8.1)

## 2020-06-19 MED ORDER — SODIUM CHLORIDE 0.9 % IV SOLN
16.0000 mg | Freq: Once | INTRAVENOUS | Status: AC
  Administered 2020-06-19: 16 mg via INTRAVENOUS
  Filled 2020-06-19: qty 8

## 2020-06-19 MED ORDER — HEPARIN SOD (PORK) LOCK FLUSH 100 UNIT/ML IV SOLN
500.0000 [IU] | Freq: Once | INTRAVENOUS | Status: AC | PRN
  Administered 2020-06-19: 500 [IU]
  Filled 2020-06-19: qty 5

## 2020-06-19 MED ORDER — SODIUM CHLORIDE 0.9 % IV SOLN
600.0000 mg/m2 | Freq: Once | INTRAVENOUS | Status: AC
  Administered 2020-06-19: 1080 mg via INTRAVENOUS
  Filled 2020-06-19: qty 54

## 2020-06-19 MED ORDER — SODIUM CHLORIDE 0.9% FLUSH
10.0000 mL | INTRAVENOUS | Status: DC | PRN
  Administered 2020-06-19: 10 mL
  Filled 2020-06-19: qty 10

## 2020-06-19 MED ORDER — SODIUM CHLORIDE 0.9 % IV SOLN
Freq: Once | INTRAVENOUS | Status: AC
  Filled 2020-06-19: qty 250

## 2020-06-19 MED ORDER — DOXORUBICIN HCL CHEMO IV INJECTION 2 MG/ML
60.0000 mg/m2 | Freq: Once | INTRAVENOUS | Status: AC
  Administered 2020-06-19: 108 mg via INTRAVENOUS
  Filled 2020-06-19: qty 54

## 2020-06-19 MED ORDER — SODIUM CHLORIDE 0.9 % IV SOLN
150.0000 mg | Freq: Once | INTRAVENOUS | Status: AC
  Administered 2020-06-19: 150 mg via INTRAVENOUS
  Filled 2020-06-19: qty 150

## 2020-06-19 MED ORDER — SODIUM CHLORIDE 0.9 % IV SOLN
10.0000 mg | Freq: Once | INTRAVENOUS | Status: AC
  Administered 2020-06-19: 10 mg via INTRAVENOUS
  Filled 2020-06-19: qty 10

## 2020-06-19 NOTE — Telephone Encounter (Signed)
This RN verified with insurance company that pt's CSF support medication - ziextenzo is being processed " and will shipped to the patient on 06/20/2020"  Note above was per automated system with no availability to speak to a live person.  This RN spoke with pt while she was in the treatment room.  She stated hesitation regarding self injecting and states she does not have a family or social support person who she knows can do this for her.  This RN provided teaching by demonstration as well as use of empty syringe with needle and a gauzed stuffed glove for pt to practice  technique.  Due to pt's anxiety relating to giving herself an injection - as well as concern that her medication may not arrive on time for Saturday's administration- pt will come into the clinic on Saturday.  She will bring her home supply of med if received and give herself the injection here- " just make me feel better knowing I am doing it right ".  If medication not received a signed and held order has been put in place for nurses to administer.  Pt stated appreciation of plan and verbalized decreased anxiety.  This note will be sent to appropriate staff who is working on Saturday.

## 2020-06-19 NOTE — Patient Instructions (Signed)
[facility] Discharge Instructions for Patients Receiving Chemotherapy  Today you received the following chemotherapy agents: Adriamycin, Cytoxan  To help prevent nausea and vomiting after your treatment, we encourage you to take your nausea medication as directed.   If you develop nausea and vomiting that is not controlled by your nausea medication, call the clinic.   BELOW ARE SYMPTOMS THAT SHOULD BE REPORTED IMMEDIATELY:  *FEVER GREATER THAN 100.5 F  *CHILLS WITH OR WITHOUT FEVER  NAUSEA AND VOMITING THAT IS NOT CONTROLLED WITH YOUR NAUSEA MEDICATION  *UNUSUAL SHORTNESS OF BREATH  *UNUSUAL BRUISING OR BLEEDING  TENDERNESS IN MOUTH AND THROAT WITH OR WITHOUT PRESENCE OF ULCERS  *URINARY PROBLEMS  *BOWEL PROBLEMS  UNUSUAL RASH Items with * indicate a potential emergency and should be followed up as soon as possible.  Feel free to call the clinic should you have any questions or concerns. The clinic phone number is [phone].  Please show the CHEMO ALERT CARD at check-in to the Emergency Department and triage nurse.  Doxorubicin injection What is this medicine? DOXORUBICIN (dox oh ROO bi sin) is a chemotherapy drug. It is used to treat many kinds of cancer like leukemia, lymphoma, neuroblastoma, sarcoma, and Wilms' tumor. It is also used to treat bladder cancer, breast cancer, lung cancer, ovarian cancer, stomach cancer, and thyroid cancer. This medicine may be used for other purposes; ask your health care provider or pharmacist if you have questions. COMMON BRAND NAME(S): Adriamycin, Adriamycin PFS, Adriamycin RDF, Rubex What should I tell my health care provider before I take this medicine? They need to know if you have any of these conditions:  heart disease  history of low blood counts caused by a medicine  liver disease  recent or ongoing radiation therapy  an unusual or allergic reaction to doxorubicin, other chemotherapy agents, other  medicines, foods, dyes, or preservatives  pregnant or trying to get pregnant  breast-feeding How should I use this medicine? This drug is given as an infusion into a vein. It is administered in a hospital or clinic by a specially trained health care professional. If you have pain, swelling, burning or any unusual feeling around the site of your injection, tell your health care professional right away. Talk to your pediatrician regarding the use of this medicine in children. Special care may be needed. Overdosage: If you think you have taken too much of this medicine contact a poison control center or emergency room at once. NOTE: This medicine is only for you. Do not share this medicine with others. What if I miss a dose? It is important not to miss your dose. Call your doctor or health care professional if you are unable to keep an appointment. What may interact with this medicine? This medicine may interact with the following medications:  6-mercaptopurine  paclitaxel  phenytoin  St. John's Wort  trastuzumab  verapamil This list may not describe all possible interactions. Give your health care provider a list of all the medicines, herbs, non-prescription drugs, or dietary supplements you use. Also tell them if you smoke, drink alcohol, or use illegal drugs. Some items may interact with your medicine. What should I watch for while using this medicine? This drug may make you feel generally unwell. This is not uncommon, as chemotherapy can affect healthy cells as well as cancer cells. Report any side effects. Continue your course of treatment even though you feel ill unless your doctor tells you to stop. There is a maximum amount of   this medicine you should receive throughout your life. The amount depends on the medical condition being treated and your overall health. Your doctor will watch how much of this medicine you receive in your lifetime. Tell your doctor if you have taken this  medicine before. You may need blood work done while you are taking this medicine. Your urine may turn red for a few days after your dose. This is not blood. If your urine is dark or brown, call your doctor. In some cases, you may be given additional medicines to help with side effects. Follow all directions for their use. Call your doctor or health care professional for advice if you get a fever, chills or sore throat, or other symptoms of a cold or flu. Do not treat yourself. This drug decreases your body's ability to fight infections. Try to avoid being around people who are sick. This medicine may increase your risk to bruise or bleed. Call your doctor or health care professional if you notice any unusual bleeding. Talk to your doctor about your risk of cancer. You may be more at risk for certain types of cancers if you take this medicine. Do not become pregnant while taking this medicine or for 6 months after stopping it. Women should inform their doctor if they wish to become pregnant or think they might be pregnant. Men should not father a child while taking this medicine and for 6 months after stopping it. There is a potential for serious side effects to an unborn child. Talk to your health care professional or pharmacist for more information. Do not breast-feed an infant while taking this medicine. This medicine has caused ovarian failure in some women and reduced sperm counts in some men This medicine may interfere with the ability to have a child. Talk with your doctor or health care professional if you are concerned about your fertility. This medicine may cause a decrease in Co-Enzyme Q-10. You should make sure that you get enough Co-Enzyme Q-10 while you are taking this medicine. Discuss the foods you eat and the vitamins you take with your health care professional. What side effects may I notice from receiving this medicine? Side effects that you should report to your doctor or health care  professional as soon as possible:  allergic reactions like skin rash, itching or hives, swelling of the face, lips, or tongue  breathing problems  chest pain  fast or irregular heartbeat  low blood counts - this medicine may decrease the number of white blood cells, red blood cells and platelets. You may be at increased risk for infections and bleeding.  pain, redness, or irritation at site where injected  signs of infection - fever or chills, cough, sore throat, pain or difficulty passing urine  signs of decreased platelets or bleeding - bruising, pinpoint red spots on the skin, black, tarry stools, blood in the urine  swelling of the ankles, feet, hands  tiredness  weakness Side effects that usually do not require medical attention (report to your doctor or health care professional if they continue or are bothersome):  diarrhea  hair loss  mouth sores  nail discoloration or damage  nausea  red colored urine  vomiting This list may not describe all possible side effects. Call your doctor for medical advice about side effects. You may report side effects to FDA at 1-800-FDA-1088. Where should I keep my medicine? This drug is given in a hospital or clinic and will not be stored at home. NOTE:  This sheet is a summary. It may not cover all possible information. If you have questions about this medicine, talk to your doctor, pharmacist, or health care provider.  2020 Elsevier/Gold Standard (2017-06-15 11:01:26)  Cyclophosphamide Injection What is this medicine? CYCLOPHOSPHAMIDE (sye kloe FOSS fa mide) is a chemotherapy drug. It slows the growth of cancer cells. This medicine is used to treat many types of cancer like lymphoma, myeloma, leukemia, breast cancer, and ovarian cancer, to name a few. This medicine may be used for other purposes; ask your health care provider or pharmacist if you have questions. COMMON BRAND NAME(S): Cytoxan, Neosar What should I tell my health  care provider before I take this medicine? They need to know if you have any of these conditions:  heart disease  history of irregular heartbeat  infection  kidney disease  liver disease  low blood counts, like white cells, platelets, or red blood cells  on hemodialysis  recent or ongoing radiation therapy  scarring or thickening of the lungs  trouble passing urine  an unusual or allergic reaction to cyclophosphamide, other medicines, foods, dyes, or preservatives  pregnant or trying to get pregnant  breast-feeding How should I use this medicine? This drug is usually given as an injection into a vein or muscle or by infusion into a vein. It is administered in a hospital or clinic by a specially trained health care professional. Talk to your pediatrician regarding the use of this medicine in children. Special care may be needed. Overdosage: If you think you have taken too much of this medicine contact a poison control center or emergency room at once. NOTE: This medicine is only for you. Do not share this medicine with others. What if I miss a dose? It is important not to miss your dose. Call your doctor or health care professional if you are unable to keep an appointment. What may interact with this medicine?  amphotericin B  azathioprine  certain antivirals for HIV or hepatitis  certain medicines for blood pressure, heart disease, irregular heart beat  certain medicines that treat or prevent blood clots like warfarin  certain other medicines for cancer  cyclosporine  etanercept  indomethacin  medicines that relax muscles for surgery  medicines to increase blood counts  metronidazole This list may not describe all possible interactions. Give your health care provider a list of all the medicines, herbs, non-prescription drugs, or dietary supplements you use. Also tell them if you smoke, drink alcohol, or use illegal drugs. Some items may interact with your  medicine. What should I watch for while using this medicine? Your condition will be monitored carefully while you are receiving this medicine. You may need blood work done while you are taking this medicine. Drink water or other fluids as directed. Urinate often, even at night. Some products may contain alcohol. Ask your health care professional if this medicine contains alcohol. Be sure to tell all health care professionals you are taking this medicine. Certain medicines, like metronidazole and disulfiram, can cause an unpleasant reaction when taken with alcohol. The reaction includes flushing, headache, nausea, vomiting, sweating, and increased thirst. The reaction can last from 30 minutes to several hours. Do not become pregnant while taking this medicine or for 1 year after stopping it. Women should inform their health care professional if they wish to become pregnant or think they might be pregnant. Men should not father a child while taking this medicine and for 4 months after stopping it. There is potential   for serious side effects to an unborn child. Talk to your health care professional for more information. Do not breast-feed an infant while taking this medicine or for 1 week after stopping it. This medicine has caused ovarian failure in some women. This medicine may make it more difficult to get pregnant. Talk to your health care professional if you are concerned about your fertility. This medicine has caused decreased sperm counts in some men. This may make it more difficult to father a child. Talk to your health care professional if you are concerned about your fertility. Call your health care professional for advice if you get a fever, chills, or sore throat, or other symptoms of a cold or flu. Do not treat yourself. This medicine decreases your body's ability to fight infections. Try to avoid being around people who are sick. Avoid taking medicines that contain aspirin, acetaminophen,  ibuprofen, naproxen, or ketoprofen unless instructed by your health care professional. These medicines may hide a fever. Talk to your health care professional about your risk of cancer. You may be more at risk for certain types of cancer if you take this medicine. If you are going to need surgery or other procedure, tell your health care professional that you are using this medicine. Be careful brushing or flossing your teeth or using a toothpick because you may get an infection or bleed more easily. If you have any dental work done, tell your dentist you are receiving this medicine. What side effects may I notice from receiving this medicine? Side effects that you should report to your doctor or health care professional as soon as possible:  allergic reactions like skin rash, itching or hives, swelling of the face, lips, or tongue  breathing problems  nausea, vomiting  signs and symptoms of bleeding such as bloody or black, tarry stools; red or dark brown urine; spitting up blood or brown material that looks like coffee grounds; red spots on the skin; unusual bruising or bleeding from the eyes, gums, or nose  signs and symptoms of heart failure like fast, irregular heartbeat, sudden weight gain; swelling of the ankles, feet, hands  signs and symptoms of infection like fever; chills; cough; sore throat; pain or trouble passing urine  signs and symptoms of kidney injury like trouble passing urine or change in the amount of urine  signs and symptoms of liver injury like dark yellow or brown urine; general ill feeling or flu-like symptoms; light-colored stools; loss of appetite; nausea; right upper belly pain; unusually weak or tired; yellowing of the eyes or skin Side effects that usually do not require medical attention (report to your doctor or health care professional if they continue or are bothersome):  confusion  decreased hearing  diarrhea  facial flushing  hair  loss  headache  loss of appetite  missed menstrual periods  signs and symptoms of low red blood cells or anemia such as unusually weak or tired; feeling faint or lightheaded; falls  skin discoloration This list may not describe all possible side effects. Call your doctor for medical advice about side effects. You may report side effects to FDA at 1-800-FDA-1088. Where should I keep my medicine? This drug is given in a hospital or clinic and will not be stored at home. NOTE: This sheet is a summary. It may not cover all possible information. If you have questions about this medicine, talk to your doctor, pharmacist, or health care provider.  2020 Elsevier/Gold Standard (2019-08-06 09:53:29)

## 2020-06-19 NOTE — Progress Notes (Signed)
Brought to infusion room for [facility] access. Presented with needle in place. Normal site assessment. Patient denies pain.

## 2020-06-19 NOTE — Addendum Note (Signed)
Addended by: Tora Kindred on: 06/19/2020 02:15 PM   Modules accepted: Orders

## 2020-06-19 NOTE — Progress Notes (Signed)
Patient called regarding Advertising account executive. Advised what is needed to apply.  Patient will bring on 06/27/20.

## 2020-06-20 ENCOUNTER — Telehealth: Payer: Self-pay | Admitting: *Deleted

## 2020-06-20 NOTE — Anesthesia Postprocedure Evaluation (Signed)
Anesthesia Post Note  Patient: Jeanne Evans  Procedure(s) Performed: INSERTION PORT-A-CATH WITH ULTRASOUND GUIDANCE (Right Chest)     Patient location during evaluation: PACU Anesthesia Type: General Level of consciousness: awake and alert Pain management: pain level controlled Vital Signs Assessment: post-procedure vital signs reviewed and stable Respiratory status: spontaneous breathing, nonlabored ventilation and respiratory function stable Cardiovascular status: blood pressure returned to baseline and stable Postop Assessment: no apparent nausea or vomiting Anesthetic complications: no   No complications documented.  Last Vitals:  Vitals:   06/18/20 1215 06/18/20 1330  BP:  (!) 155/95  Pulse: 87 80  Resp: 18 16  Temp:  36.9 C  SpO2: 100% 100%    Last Pain:  Vitals:   06/18/20 1330  TempSrc:   PainSc: [place]

## 2020-06-21 ENCOUNTER — Inpatient Hospital Stay: Payer: 59

## 2020-06-21 ENCOUNTER — Other Ambulatory Visit: Payer: Self-pay

## 2020-06-23 ENCOUNTER — Ambulatory Visit
Admission: RE | Admit: 2020-06-23 | Discharge: 2020-06-23 | Disposition: A | Discharge status: Home / Self Care | Payer: 59 | Source: Ambulatory Visit | Attending: Oncology | Admitting: Oncology

## 2020-06-23 ENCOUNTER — Other Ambulatory Visit: Payer: Self-pay

## 2020-06-23 ENCOUNTER — Other Ambulatory Visit: Payer: Self-pay | Admitting: General Practice

## 2020-06-23 DIAGNOSIS — Z170 Estrogen receptor positive status [ER+]: Secondary | ICD-10-CM

## 2020-06-23 DIAGNOSIS — C50412 Malignant neoplasm of upper-outer quadrant of left female breast: Secondary | ICD-10-CM

## 2020-06-23 MED ORDER — GADOBUTROL 1 MMOL/ML IV SOLN
7.0000 mL | Freq: Once | INTRAVENOUS | Status: AC | PRN
  Administered 2020-06-23: 7 mL via INTRAVENOUS

## 2020-06-24 ENCOUNTER — Encounter: Payer: Self-pay | Admitting: Licensed Clinical Social Worker

## 2020-06-24 ENCOUNTER — Telehealth: Payer: Self-pay | Admitting: Genetic Counselor

## 2020-06-24 NOTE — Progress Notes (Signed)
[place] CSW Progress Note  Clinical Education officer, museum received TC from patient asking about possible rental assistance for next month. She has had to take some time off with FMLA for treatment and is trying to be proactive (does not have short-term disability).  CSW reviewed potential assistance through [place], [place]. E-mailed information as requested by pt. 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   No further questions at this time.    Jeanne Evans , LCSW

## 2020-06-24 NOTE — Telephone Encounter (Signed)
Revealed negative genetic testing.  Discussed that we do not know why she has breast cancer or why there is cancer in the family. It could be due to a different gene that we are not testing, or maybe our current technology may not be able to pick something up.  It will be important for her to keep in contact with genetics to keep up with whether additional testing may be needed. 

## 2020-06-25 ENCOUNTER — Other Ambulatory Visit: Payer: Self-pay | Admitting: Oncology

## 2020-06-26 ENCOUNTER — Encounter: Payer: Self-pay | Admitting: Genetic Counselor

## 2020-06-26 ENCOUNTER — Ambulatory Visit: Payer: Self-pay | Admitting: Genetic Counselor

## 2020-06-26 DIAGNOSIS — Z1379 Encounter for other screening for genetic and chromosomal anomalies: Secondary | ICD-10-CM

## 2020-06-26 DIAGNOSIS — Z170 Estrogen receptor positive status [ER+]: Secondary | ICD-10-CM

## 2020-06-26 DIAGNOSIS — C50412 Malignant neoplasm of upper-outer quadrant of left female breast: Secondary | ICD-10-CM

## 2020-06-26 DIAGNOSIS — Z8041 Family history of malignant neoplasm of ovary: Secondary | ICD-10-CM

## 2020-06-26 DIAGNOSIS — Z8042 Family history of malignant neoplasm of prostate: Secondary | ICD-10-CM

## 2020-06-26 NOTE — Progress Notes (Signed)
HPI:  Ms. Flood was previously seen in the [facility] due to a personal history of invasive ductal carcinoma diagnosed at age 34, a family history of prostate and ovarian cancer, and concerns regarding a hereditary predisposition to cancer. Please refer to our prior cancer genetics clinic note for more information regarding our discussion, assessment and recommendations, at the time. Ms. Millar recent genetic test results were disclosed to her, as were recommendations warranted by these results. These results and recommendations are discussed in more detail below.  CANCER HISTORY:  Oncology History  Malignant neoplasm of upper-outer quadrant of left breast in female, estrogen receptor positive ([place])  06/03/2020 Initial Diagnosis   Malignant neoplasm of upper-outer quadrant of left breast in female, estrogen receptor positive ([place])   06/19/2020 -  Chemotherapy   The patient had dexamethasone (DECADRON) 4 MG tablet, 1 of 1 cycle, Start date: 06/04/2020, End date: -- DOXOrubicin (ADRIAMYCIN) chemo injection 108 mg, 60 mg/m2 = 108 mg, Intravenous,  Once, 1 of 4 cycles Administration: 108 mg (06/19/2020) ondansetron (ZOFRAN) 16 mg in sodium chloride 0.9 % 50 mL IVPB, 16 mg (100 % of original dose 16 mg), Intravenous,  Once, 1 of 16 cycles Dose modification: 16 mg (original dose 16 mg, Cycle 1) Administration: 16 mg (06/19/2020) CARBOplatin (PARAPLATIN) in sodium chloride 0.9 % 100 mL chemo infusion, , Intravenous,  Once, 0 of 12 cycles cyclophosphamide (CYTOXAN) 1,080 mg in sodium chloride 0.9 % 250 mL chemo infusion, 600 mg/m2 = 1,080 mg, Intravenous,  Once, 1 of 4 cycles Administration: 1,080 mg (06/19/2020) PACLitaxel (TAXOL) 144 mg in sodium chloride 0.9 % 250 mL chemo infusion (</= 43m/m2), 80 mg/m2, Intravenous,  Once, 0 of 12 cycles fosaprepitant (EMEND) 150 mg in sodium chloride 0.9 % 145 mL IVPB, 150 mg, Intravenous,  Once, 1 of 4 cycles Administration: 150 mg (06/19/2020)   for chemotherapy treatment.    06/23/2020 Genetic Testing   No pathogenic variants detected in [facility] Multi-Cancer Panel. The Multi-Cancer Panel offered by [facility] includes sequencing and/or deletion duplication testing of the following 85 genes: AIP, ALK, APC, ATM, AXIN2,BAP1,  BARD1, BLM, BMPR1A, BRCA1, BRCA2, BRIP1, CASR, CDC73, CDH1, CDK4, CDKN1B, CDKN1C, CDKN2A (p14ARF), CDKN2A (p16INK4a), CEBPA, CHEK2, CTNNA1, DICER1, DIS3L2, EGFR (c.2369C>T, p.Thr790Met variant only), EPCAM (Deletion/duplication testing only), FH, FLCN, GATA2, GPC3, GREM1 (Promoter region deletion/duplication testing only), HOXB13 (c.251G>A, p.Gly84Glu), HRAS, KIT, MAX, MEN1, MET, MITF (c.952G>A, p.Glu318Lys variant only), MLH1, MSH2, MSH3, MSH6, MUTYH, NBN, NF1, NF2, NTHL1, PALB2, PDGFRA, PHOX2B, PMS2, POLD1, POLE, POT1, PRKAR1A, PTCH1, PTEN, RAD50, RAD51C, RAD51D, RB1, RECQL4, RET, RNF43, RUNX1, SDHAF2, SDHA (sequence changes only), SDHB, SDHC, SDHD, SMAD4, SMARCA4, SMARCB1, SMARCE1, STK11, SUFU, TERC, TERT, TMEM127, TP53, TSC1, TSC2, VHL, WRN and WT1. The report date is June 23, 2020.      FAMILY HISTORY:  We obtained a detailed, 4-generation family history.  Significant diagnoses are listed below: Family History  Problem Relation Age of Onset  . Lung cancer Maternal Uncle        dx late 543s . Prostate cancer Paternal Uncle        dx late 669s . Ovarian cancer Paternal Grandmother        dx 79s . Prostate cancer Paternal Grandfather        dx 764s . Prostate cancer Paternal Uncle        dx early 752s      Ms. Berrey's father is living at age 36 Ms. MBuesinghas two paternal  uncles diagnosed with prostate cancer in their late 49s and early 55s.  These maternal uncles are living at age 26 and 76.  Ms. Eustache does not believe that their prostate cancers were metastatic.  Ms. Cansler paternal grandfather was also diagnosed with prostate cancer in his 5s and passed away at 41.  Ms. Bieda paternal grandmother  was diagnosed with ovarian cancer in her 40s and passed away in her 28s.    Ms. Mariani mother is living at age 12.  Ms. Lean has one maternal uncle who was diagnosed with lung cancer in his late 33s and passed away in his late 30s.  Ms. Diop maternal grandfather passed away at 4 and maternal grandmother passed away at 36 from a hert attack.  No other family history of cancer was reported.   Ms. Batterman is unaware of previous family history of genetic testing for hereditary cancer risks. Patient's maternal ancestors are of [place] and [place] ancestry, and paternal ancestors are of [place] American ancestry. There is no reported Ashkenazi Jewish ancestry. There is no known consanguinity  GENETIC TEST RESULTS: Genetic testing reported out on June 23, 2020. The Multi-Cancer Panel through [facility] found no pathogenic mutations. The Multi-Cancer Panel offered by [facility] includes sequencing and/or deletion duplication testing of the following 85 genes: AIP, ALK, APC, ATM, AXIN2,BAP1,  BARD1, BLM, BMPR1A, BRCA1, BRCA2, BRIP1, CASR, CDC73, CDH1, CDK4, CDKN1B, CDKN1C, CDKN2A (p14ARF), CDKN2A (p16INK4a), CEBPA, CHEK2, CTNNA1, DICER1, DIS3L2, EGFR (c.2369C>T, p.Thr790Met variant only), EPCAM (Deletion/duplication testing only), FH, FLCN, GATA2, GPC3, GREM1 (Promoter region deletion/duplication testing only), HOXB13 (c.251G>A, p.Gly84Glu), HRAS, KIT, MAX, MEN1, MET, MITF (c.952G>A, p.Glu318Lys variant only), MLH1, MSH2, MSH3, MSH6, MUTYH, NBN, NF1, NF2, NTHL1, PALB2, PDGFRA, PHOX2B, PMS2, POLD1, POLE, POT1, PRKAR1A, PTCH1, PTEN, RAD50, RAD51C, RAD51D, RB1, RECQL4, RET, RNF43, RUNX1, SDHAF2, SDHA (sequence changes only), SDHB, SDHC, SDHD, SMAD4, SMARCA4, SMARCB1, SMARCE1, STK11, SUFU, TERC, TERT, TMEM127, TP53, TSC1, TSC2, VHL, WRN and WT1. The test report has been scanned into EP[employer] and is located under the Molecular Pathology section of the Results Review tab.  A portion of the result report is  included below for reference.     We discussed with Ms. Horvath that because current genetic testing is not perfect, it is possible there may be a gene mutation in one of these genes that current testing cannot detect, but that chance is small.  We also discussed, that there could be another gene that has not yet been discovered, or that we have not yet tested, that is responsible for the cancer diagnoses in the family. It is also possible there is a hereditary cause for the cancer in the family that Ms. Barraco did not inherit and therefore was not identified in her testing.  Therefore, it is important to remain in touch with cancer genetics in the future so that we can continue to offer Ms. Stinger the most up to date genetic testing.   ADDITIONAL GENETIC TESTING: We discussed with Ms. Ho that her genetic testing was fairly extensive.  If there are genes identified to increase cancer risk that can be analyzed in the future, we would be happy to discuss and coordinate this testing at that time.    CANCER SCREENING RECOMMENDATIONS: Ms. Ogburn test result is considered negative (normal).  This means that we have not identified a hereditary cause for her personal history of breast cancer and family history of ovarian cancer at this time. Most cancers happen by chance and this negative test suggests that  her cancer may fall into this category.    Given Ms. Constantino's personal and family histories, we must interpret these negative results with some caution.  Families with features suggestive of hereditary risk for cancer tend to have multiple family members with cancer, diagnoses in multiple generations and diagnoses before the age of 48. Ms. Wilmot family exhibits some of these features. Thus, this result may simply reflect our current inability to detect all mutations within these genes or there may be a different gene that has not yet been discovered or tested.   An individual's cancer risk and medical  management are not determined by genetic test results alone. Overall cancer risk assessment incorporates additional factors, including personal medical history, family history, and any available genetic information that may result in a personalized plan for cancer prevention and surveillance.   RECOMMENDATIONS FOR FAMILY MEMBERS:  Individuals in this family might be at some increased risk of developing cancer, over the general population risk, simply due to the family history of cancer.  We recommended women in this family have a yearly mammogram beginning at age 4, or 50 years younger than the earliest onset of cancer, an annual clinical breast exam, and perform monthly breast self-exams. Women in this family should also have a gynecological exam as recommended by their primary provider. All family members should be referred for colonoscopy starting at age 6.  It is also possible there is a hereditary cause for the cancer in Ms. Haughton's family that she did not inherit and therefore was not identified in her.  Based on Ms. Melody's family history, we recommended her father and paternal aunts/uncles, whose mother was diagnosed with ovarian cancer in her 77s, have genetic counseling. Ms. Peplinski will let us know if we can be of any assistance in coordinating genetic counseling and/or testing for this family member.   FOLLOW-UP: Lastly, we discussed with Ms. Schum that cancer genetics is a rapidly advancing field and it is possible that new genetic tests will be appropriate for her and/or her family members in the future. We encouraged her to remain in contact with cancer genetics on an annual basis so we can update her personal and family histories and let her know of advances in cancer genetics that may benefit this family.   Our contact number was provided. Ms. Cavalieri questions were answered to her satisfaction, and she knows she is welcome to call us at anytime with additional questions or concerns.      Tymira Horkey M. Joette Catching, [email or web address] (P) [phone]

## 2020-06-27 ENCOUNTER — Other Ambulatory Visit: Payer: Self-pay

## 2020-06-27 ENCOUNTER — Inpatient Hospital Stay: Payer: 59

## 2020-06-27 ENCOUNTER — Inpatient Hospital Stay (HOSPITAL_BASED_OUTPATIENT_CLINIC_OR_DEPARTMENT_OTHER): Payer: 59 | Admitting: Adult Health

## 2020-06-27 ENCOUNTER — Encounter: Payer: Self-pay | Admitting: Oncology

## 2020-06-27 VITALS — BP 131/84 | HR 81 | Temp 97.5°F | Resp 18 | Ht 63.5 in | Wt 155.0 lb

## 2020-06-27 DIAGNOSIS — Z170 Estrogen receptor positive status [ER+]: Secondary | ICD-10-CM | POA: Diagnosis not present

## 2020-06-27 DIAGNOSIS — C50412 Malignant neoplasm of upper-outer quadrant of left female breast: Secondary | ICD-10-CM | POA: Diagnosis not present

## 2020-06-27 DIAGNOSIS — Z5111 Encounter for antineoplastic chemotherapy: Secondary | ICD-10-CM | POA: Diagnosis not present

## 2020-06-27 LAB — COMPREHENSIVE METABOLIC PANEL
ALT: 19 U/L (ref 0–44)
AST: 14 U/L — ABNORMAL LOW (ref 15–41)
Albumin: 3.6 g/dL (ref 3.5–5.0)
Alkaline Phosphatase: 77 U/L (ref 38–126)
Anion gap: 10 (ref 5–15)
BUN: 12 mg/dL (ref 6–20)
CO2: 24 mmol/L (ref 22–32)
Calcium: 9.5 mg/dL (ref 8.9–10.3)
Chloride: 103 mmol/L (ref 98–111)
Creatinine, Ser: 0.78 mg/dL (ref 0.44–1.00)
GFR calc Af Amer: >60 mL/min (ref >60)
GFR calc non Af Amer: >60 mL/min (ref >60)
Glucose, Bld: 100 mg/dL — ABNORMAL HIGH (ref 70–99)
Potassium: 4.2 mmol/L (ref 3.5–5.1)
Sodium: 137 mmol/L (ref 135–145)
Total Bilirubin: 0.4 mg/dL (ref 0.3–1.2)
Total Protein: 7.3 g/dL (ref 6.5–8.1)

## 2020-06-27 LAB — CBC WITH DIFFERENTIAL/PLATELET
Abs Immature Granulocytes: 0.08 10*3/uL — ABNORMAL HIGH (ref 0.00–0.07)
Basophils Absolute: 0.1 10*3/uL (ref 0.0–0.1)
Basophils Relative: 1 %
Eosinophils Absolute: 0.1 10*3/uL (ref 0.0–0.5)
Eosinophils Relative: 3 %
HCT: 34.4 % — ABNORMAL LOW (ref 36.0–46.0)
Hemoglobin: 11.6 g/dL — ABNORMAL LOW (ref 12.0–15.0)
Immature Granulocytes: 2 %
Lymphocytes Relative: 36 %
Lymphs Abs: 1.6 10*3/uL (ref 0.7–4.0)
MCH: 29.2 pg (ref 26.0–34.0)
MCHC: 33.7 g/dL (ref 30.0–36.0)
MCV: 86.6 fL (ref 80.0–100.0)
Monocytes Absolute: 0.4 10*3/uL (ref 0.1–1.0)
Monocytes Relative: 10 %
Neutro Abs: 2.2 10*3/uL (ref 1.7–7.7)
Neutrophils Relative %: 48 %
Platelets: 140 10*3/uL — ABNORMAL LOW (ref 150–400)
RBC: 3.97 MIL/uL (ref 3.87–5.11)
RDW: 13.8 % (ref 11.5–15.5)
WBC: 4.4 10*3/uL (ref 4.0–10.5)
nRBC: 0 % (ref 0.0–0.2)

## 2020-06-27 NOTE — Progress Notes (Signed)
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   She started neoadjuvant chemotherapy on 06/19/2020.  She will receive dose dense doxorubicin and Cyclophosphamide given on day 1 of a 14 day cycle with growth factor support on day 3 with at home injectable Ziextenzo x 4 cycles, followed by weekly Paclitaxel and Carboplatin x 12 cycles.    Today is cycle 1 day 8 of therapy.  She notes she tolerated chemotherapy moderately well.  She notes her main issue is left breast pain from her additional breast biopsy on 06/23/2020.  She notes that the pain has been worse than her initial biopsy that was completed on 05/29/2020.    Her breast biopsy on 06/23/2020 showed benign breast parenchyma/inflammation.  No malignancy was identified.     REVIEW OF SYSTEMS: Jeanne Evans is doing moderately well today.  She has had some reflux which has been mildly troublesome to her.  This has contributed to increased nausea over the past couple of days.  She also notes her port feels weird, and she is still adjusting to having it.  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  She also has had some hot flashes and difficulty  sleeping.  She has been taking Lorazepam at night.  She is experiencing mild taste changes.  She is mildly fatigued.  Jeanne Evans denies any fever, chills, chest pain, palpitations, cough, shortness of breath, vomiting, bowel/bladder changes, or any other concerns.  A detailed ROS was otherwise non contributory.     HISTORY OF CURRENT ILLNESS: From the original intake note:  Jeanne Evans herself palpated a painful left breast lump. She underwent bilateral diagnostic mammography with tomography and left breast ultrasonography at [facility] on 05/12/2020 showing: breast density category B; palpable 2.7 cm oval hypoechoic mass in left breast at 2-3 o'clock; 2.2 cm left axillary lymph node/grouping of nodes.  Accordingly on 05/29/2020 she proceeded to biopsy of the left breast area in question. The pathology from this procedure ([id number]) showed: invasive ductal carcinoma, grade 3. Prognostic indicators significant for: estrogen receptor, 10% positive with weak staining intensity and progesterone receptor, 0% negative. Proliferation marker Ki67 at 95%. HER2 equivocal by immunohistochemistry (2+), but negative by fluorescent in situ hybridization with a signals ratio 1.77 and number per cell 2.65.  Biopsy of the left axillary lymph node in question was negative and concordant  The patient's subsequent history is as detailed below.   PAST MEDICAL HISTORY: Past Medical History:  Diagnosis Date  . Breast cancer ([place])   . Family history of ovarian cancer   . Family history of prostate cancer   . History of asthma    has albuterol, uses PRN  . History of heart murmur in childhood   . History of multiple allergies   History of allergy related headaches  PAST SURGICAL HISTORY: Past Surgical History:  Procedure Laterality Date  . PORTACATH PLACEMENT Right 06/18/2020   Procedure: INSERTION PORT-A-CATH WITH ULTRASOUND GUIDANCE;  Surgeon: Erroll Luna, MD;  Location: [facility];  Service: General;  Laterality: Right;    She reports having a boil lanced from her perineal area   FAMILY HISTORY: Family History  Problem Relation Age of Onset  . Lung cancer Maternal Uncle        dx late 10s  . Prostate cancer Paternal Uncle        dx late 61s  . Ovarian cancer Paternal Grandmother        dx 18s  . Prostate cancer Paternal Grandfather        dx 28s  . Prostate cancer Paternal Uncle        dx early 66s  The patient's father is 82 and her mother 42, as of 05/2020.  The patient has two brothers and one sister. She reports ovarian cancer in her paternal grandmother, prostate cancer in her paternal grandfather and a paternal uncle, and lung cancer in a maternal uncle.   GYNECOLOGIC HISTORY:  No Evans recorded. (Menstrual status: IUD). Menarche: 34 years old Age at first live birth: 34 years old Jeanne Evans current, experiences spotting Contraceptive: Mirena IUD in place HRT n/a  Hysterectomy? no BSO? 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 She describes herself as single. She lives at home with her children, Jeanne Evans (age 55) and Jeanne Evans (age 68).     ADVANCED DIRECTIVES: Not in place. She intends to name her mother, Jeanne Evans, as her HCPOA.  Jeanne Evans lives in [place] and can be reached at [phone].  The patient was given the appropriate documents to complete and notarized at her discretion at the time of her visit 06/04/2020   HEALTH MAINTENANCE: Social History   Tobacco Use  . Smoking status: Never Smoker  . Smokeless tobacco: Never Used  Substance Use Topics  . Alcohol use: Yes    Comment: occas  . Drug use: Yes    Types: Marijuana     Colonoscopy: n/a (age)  PAP: 04/2020  Bone density: n/a (age)   Allergies  Allergen Reactions  . Sulfa Antibiotics Hives    Current Outpatient Medications  Medication Sig  Dispense Refill  . albuterol (VENTOLIN HFA) 108 (90 Base) MCG/ACT inhaler Inhale into the lungs every 6 (six) hours as needed for wheezing or shortness of breath.    . dexamethasone (DECADRON) 4 MG tablet Take 2 tablets by mouth daily starting the day after Carboplatin and Cytoxan x 3 days. Take with food. 30 tablet 1  . ibuprofen (ADVIL) 800 MG tablet Take 1 tablet (800 mg total) by mouth every 8 (eight) hours as needed. 30 tablet 0  . levonorgestrel (MIRENA) 20 MCG/24HR IUD 1 each by Intrauterine route once.    . lidocaine-prilocaine (EMLA) cream Apply to affected area once 30 g 3  . LORazepam (ATIVAN) 0.5 MG tablet Take 1 tablet (0.5 mg total) by mouth at bedtime as needed (Nausea or vomiting). 20 tablet 0  . oxyCODONE (OXY IR/ROXICODONE) 5 MG immediate release tablet Take 1 tablet (5 mg total) by mouth every 6 (six) hours as needed for severe pain. 15 tablet 0  . pegfilgrastim-bmez (ZIEXTENZO) 6 MG/0.6ML injection Inject 0.6 mLs (6 mg total) into the skin every 14 (fourteen) days. 1 Syringe 3  .  prochlorperazine (COMPAZINE) 10 MG tablet Take 1 tablet (10 mg total) by mouth every 6 (six) hours as needed (Nausea or vomiting). 30 tablet 1   No current facility-administered medications for this visit.    OBJECTIVE:  Vitals:   06/27/20 1154  BP: 131/84  Pulse: 81  Resp: 18  Temp: (!) 97.5 F (36.4 C)  SpO2: 100%     Body mass index is 27.03 kg/m.   Wt Readings from Last 3 Encounters:  06/27/20 155 lb (70.3 kg)  06/18/20 158 lb 4.6 oz (71.8 kg)  06/17/20 161 lb (73 kg)  ECOG FS:1 - Symptomatic but completely ambulatory GENERAL: Patient is a well appearing female in no acute distress HEENT:  Sclerae anicteric. Mask in place. Neck is supple.  NODES:  No cervical, supraclavicular, or axillary lymphadenopathy palpated.  BREAST EXAM:  Left breast mass is softening, no progression noted.  Left breast biopsy site undressed, no surrounding erythema, warmth, drainage, or swelling noted,  +TTP LUNGS:  Clear to auscultation bilaterally.  No wheezes or rhonchi. HEART:  Regular rate and rhythm. No murmur appreciated. ABDOMEN:  Soft, nontender.  Positive, normoactive bowel sounds. No organomegaly palpated. MSK:  No focal spinal tenderness to palpation. Full range of motion bilaterally in the upper extremities. EXTREMITIES:  No peripheral edema.   SKIN:  Clear with no obvious rashes or skin changes. No nail dyscrasia. NEURO:  Nonfocal. Well oriented.  Appropriate affect.    LAB RESULTS:  CMP     Component Value Date/Time   NA 137 06/27/2020 1140   K 4.2 06/27/2020 1140   CL 103 06/27/2020 1140   CO2 24 06/27/2020 1140   GLUCOSE 100 (H) 06/27/2020 1140   BUN 12 06/27/2020 1140   CREATININE 0.78 06/27/2020 1140   CREATININE 0.92 06/04/2020 0830   CALCIUM 9.5 06/27/2020 1140   PROT 7.3 06/27/2020 1140   ALBUMIN 3.6 06/27/2020 1140   AST 14 (L) 06/27/2020 1140   AST 13 (L) 06/04/2020 0830   ALT 19 06/27/2020 1140   ALT 14 06/04/2020 0830   ALKPHOS 77 06/27/2020 1140   BILITOT 0.4 06/27/2020 1140   BILITOT 0.5 06/04/2020 0830   GFRNONAA >60 06/27/2020 1140   GFRNONAA >60 06/04/2020 0830   GFRAA >60 06/27/2020 1140   GFRAA >60 06/04/2020 0830    No results found for: TOTALPROTELP, ALBUMINELP, A1GS, A2GS, BETS, BETA2SER, GAMS, MSPIKE, SPEI  Lab Results  Component Value Date   WBC 4.4 06/27/2020   NEUTROABS 2.2 06/27/2020   HGB 11.6 (L) 06/27/2020   HCT 34.4 (L) 06/27/2020   MCV 86.6 06/27/2020   PLT 140 (L) 06/27/2020    No results found for: LABCA2  No components found for: TTSVXB939  No results for input(s): INR in the last 168 hours.  No results found for: LABCA2  No results found for: QZE092  No results found for: ZRA076  No results found for: AUQ333  No results found for: CA2729  No components found for: HGQUANT  No results found for: CEA1 / No results found for: CEA1   No results found for: AFPTUMOR  No results found for:  CHROMOGRNA  No results found for: KPAFRELGTCHN, LAMBDASER, KAPLAMBRATIO (kappa/lambda light chains)  No results found for: HGBA, HGBA2QUANT, HGBFQUANT, HGBSQUAN (Hemoglobinopathy evaluation)   No results found for: LDH  No results found for: IRON, TIBC, IRONPCTSAT (Iron and TIBC)  No results found for: FERRITIN  Urinalysis No results found for: COLORURINE, APPEARANCEUR, LABSPEC, PHURINE, GLUCOSEU, HGBUR, BILIRUBINUR, KETONESUR, PROTEINUR, UROBILINOGEN, NITRITE,  LEUKOCYTESUR   STUDIES: CT CHEST W CONTRAST  Result Date: 06/13/2020 CLINICAL DATA:  Breast cancer staging EXAM: CT CHEST WITH CONTRAST TECHNIQUE: Multidetector CT imaging of the chest was performed during intravenous contrast administration. CONTRAST:  34m OMNIPAQUE IOHEXOL 300 MG/ML  SOLN COMPARISON:  None FINDINGS: Cardiovascular: Heart size is normal. No pericardial effusion. Central pulmonary vasculature is unremarkable on venous phase assessment. Aorta is of normal caliber without significant atherosclerosis. Mediastinum/Nodes: No thoracic inlet adenopathy. Small lymph nodes in the LEFT axilla, biopsy marker in place in the region of the LEFT axilla and also within a LEFT breast mass. LEFT breast mass in the LEFT breast laterally measuring 3.9 x 2.7 cm towards the upper outer quadrant. Small lymph node containing a marker measuring 8 mm short axis. There are other lymph nodes tracking beneath the pectoralis musculature largest measuring 7 mm short axis. Most of these lymph nodes retain fatty hila. Similar small lymph nodes are seen in the RIGHT axilla Enlargement of subcarinal, RIGHT paratracheal and by hilar lymph nodes. RIGHT hilar lymph node (image 62, series 2) 11 mm short axis Subcarinal lymph node (image 56, series 2) 12 mm short axis. (Image 48, series 2) 15 mm RIGHT hilar lymph node. No retrocrural adenopathy. Slightly smaller lymph nodes throughout the LEFT hilum and scattered about the RIGHT hilum as well. Small lymph  nodes track into the RIGHT paratracheal chain, these are less than a cm. No enlarged internal mammary lymph nodes, a single un enlarged node is seen on image 41 of series 2 Lungs/Pleura: Bilateral upper lobe nodules, too numerous to count. These are small, less than a cm. Also involvement of the superior segment of the RIGHT lower lobe greater than LEFT with tiny 4 mm nodules. (Image 70, series 7) 5 mm RIGHT lower lobe pulmonary nodule slightly inferior to superior segment. Airways are patent. There is some nodule seen along the fissure, major fissure in the RIGHT chest predominantly on the RIGHT. Upper Abdomen: Numerous liver lesions, greater than 220 lesions in the visualized portions of the liver. Visualized adrenal glands are normal. No acute upper abdominal process. Spleen is normal size. Musculoskeletal: No acute musculoskeletal finding or destructive bone lesion. IMPRESSION: 1. LEFT breast mass with mildly enlarged axillary lymph nodes and a rounded LEFT subpectoral lymph node as described. 2. Multiple ill-defined pulmonary nodules, signs of pleural nodularity and relatively symmetric bihilar and mediastinal adenopathy most suggestive of sarcoidosis in this patient with history of breast cancer. Biopsy may be required to exclude the possibility of metastatic disease though the pattern of upper lobe disease combined with the pattern of mediastinal disease appears atypical for breast cancer. 3. Multiple hepatic lesions may also require tissue sampling but could also be seen in the setting of metastatic disease or sarcoidosis. 4. Aortic atherosclerosis. Aortic Atherosclerosis (ICD10-I70.0). Electronically Signed   By: GZetta BillsM.D.   On: 06/13/2020 22:07   NM Bone Scan Whole Body  Result Date: 06/14/2020 CLINICAL DATA:  34year old with a recent diagnosis of grade 3 invasive ductal carcinoma involving the LEFT breast. Initial staging. EXAM: NUCLEAR MEDICINE WHOLE BODY BONE SCAN TECHNIQUE: Whole body  anterior and posterior images were obtained approximately 3 hours after intravenous injection of radiopharmaceutical. RADIOPHARMACEUTICALS:  19.6 mCi Technetium-960mDP IV COMPARISON:  None. FINDINGS: No abnormal osseous activity in the axial or appendicular skeleton to suggest metastatic disease. Physiologic excretion of the radiopharmaceutical by the urinary tract. IMPRESSION: Normal examination.  No evidence of osseous metastatic disease. Electronically Signed   By:  Evangeline Dakin M.D.   On: 06/14/2020 15:32   MR BREAST BILATERAL W WO CONTRAST INC CAD  Result Date: 06/16/2020 CLINICAL DATA:  34 year old with biopsy-proven grade 3 invasive ductal carcinoma involving the UPPER OUTER QUADRANT of the LEFT breast (ER positive, PR negative, HER 2 Neu negative, Ki67 95%). Biopsy of a LEFT axillary lymph node with eccentric cortical thickening demonstrated no evidence of metastatic disease. Pretreatment MRI is performed to confirm extent of disease. LABS:  Not applicable. EXAM: BILATERAL BREAST MRI WITH AND WITHOUT CONTRAST TECHNIQUE: Multiplanar, multisequence MR images of both breasts were obtained prior to and following the intravenous administration of 7 ml of Gadavist. Three-dimensional MR images were rendered by post-processing of the original MR data on an independent workstation. The three-dimensional MR images were interpreted, and findings are reported in the following complete MRI report for this study. Three dimensional images were evaluated at the independent interpreting workstation using the DynaCAD thin client. COMPARISON:  No prior MRI. Recent mammography and LEFT breast ultrasound. CT chest 06/13/2020 is also correlated. FINDINGS: Breast composition: c. Heterogeneous fibroglandular tissue. Background parenchymal enhancement: Mild. Right breast: No evidence of mass or abnormal enhancement. Left breast: The palpable lump was marked with a Vitamin-E capsule. Heterogeneously enhancing mass in the outer  breast, slight upper outer quadrant, biopsy-proven grade IDC, measuring approximately 3.5 X 3.4 X 3.9 cm (AP x transverse x craniocaudal), demonstrating rapid washout kinetics. Contiguous with and immediately anterior to the enhancing mass is stippled non-mass enhancement measuring approximately 2.1 x 2.6 x 2.7 cm, demonstrating progressive enhancement kinetics. Lymph nodes: No pathologic lymphadenopathy. Ancillary findings:  None. IMPRESSION: 1. Biopsy-proven grade 3 invasive ductal carcinoma involving the outer LEFT breast at posterior depth, slight upper outer quadrant, with maximum measurement 3.9 cm by MRI. 2. Indeterminate stippled non-mass enhancement contiguous with and immediately anterior to the biopsy-proven malignancy in the outer LEFT breast. The non-mass enhancement in total is measured above and extends approximately 2.1 cm anterior to the mass. (Total AP span of the mass and non-mass enhancement is approximately 5.5 cm). 3. No MRI evidence of malignancy involving the RIGHT breast. 4. No pathologic lymphadenopathy. RECOMMENDATION: MRI guided biopsy of the stippled non-mass enhancement anterior to the biopsy-proven malignancy in the outer LEFT breast. BI-RADS CATEGORY  4: Suspicious. Electronically Signed   By: Evangeline Dakin M.D.   On: 06/16/2020 14:24   DG Chest Port 1 View  Result Date: 06/18/2020 CLINICAL DATA:  Portacath insertion EXAM: PORTABLE CHEST 1 VIEW COMPARISON:  Portable exam at 1253 hrs compared to CT chest of 06/13/2020 FINDINGS: RIGHT jugular Port-A-Cath with tip projecting over SVC. Upper normal heart size. Mediastinal contours and pulmonary vascularity normal. Prominent hila bilaterally corresponding to adenopathy on CT. Minimal RIGHT basilar atelectasis. Lungs otherwise clear. No acute infiltrate, pleural effusion, or pneumothorax. IMPRESSION: No pneumothorax following Port-A-Cath insertion. BILATERAL hilar enlargement corresponding to adenopathy seen on CT. Electronically  Signed   By: Lavonia Dana M.D.   On: 06/18/2020 13:20   DG Fluoro Guide CV Line-No Report  Result Date: 06/18/2020 Fluoroscopy was utilized by the requesting physician.  No radiographic interpretation.   ECHOCARDIOGRAM COMPLETE  Result Date: 06/13/2020    ECHOCARDIOGRAM REPORT   Patient Name:   LAYCEE FITZSIMMONS Date of Exam: 06/13/2020 Medical Rec #:  [id number]          Height:       63.5 in Accession #:    [id number]         Weight:  159.0 lb Date of Birth:  1986-08-13         BSA:          1.764 m Patient Age:    33 years           BP:           141/84 mmHg Patient Gender: F                  HR:           77 bpm. Exam Location:  Outpatient Procedure: 2D Echo Indications:    Chemotherapy Evaluation V58.11  History:        Patient has no prior history of Echocardiogram examinations.                 Breast Cancer.  Sonographer:    Mikki Santee RDCS (AE) Referring Phys: [place]  1. Left ventricular ejection fraction, by estimation, is 60 to 65%. The left ventricle has normal function. The left ventricle has no regional wall motion abnormalities. There is mild left ventricular hypertrophy. Left ventricular diastolic parameters were normal. The average left ventricular global longitudinal strain is -21.5 %.  2. Right ventricular systolic function is normal. The right ventricular size is normal. Tricuspid regurgitation signal is inadequate for assessing PA pressure.  3. The mitral valve is normal in structure. Trivial mitral valve regurgitation.  4. The aortic valve is tricuspid. Aortic valve regurgitation is not visualized. No aortic stenosis is present.  5. The inferior vena cava is normal in size with greater than 50% respiratory variability, suggesting right atrial pressure of 3 mmHg. FINDINGS  Left Ventricle: Left ventricular ejection fraction, by estimation, is 60 to 65%. The left ventricle has normal function. The left ventricle has no regional wall motion  abnormalities. The average left ventricular global longitudinal strain is -21.5 %. The left ventricular internal cavity size was normal in size. There is mild left ventricular hypertrophy. Left ventricular diastolic parameters were normal. Right Ventricle: The right ventricular size is normal. No increase in right ventricular wall thickness. Right ventricular systolic function is normal. Tricuspid regurgitation signal is inadequate for assessing PA pressure. Left Atrium: Left atrial size was normal in size. Right Atrium: Right atrial size was normal in size. Pericardium: There is no evidence of pericardial effusion. Mitral Valve: The mitral valve is normal in structure. Trivial mitral valve regurgitation. Tricuspid Valve: The tricuspid valve is normal in structure. Tricuspid valve regurgitation is not demonstrated. Aortic Valve: The aortic valve is tricuspid. Aortic valve regurgitation is not visualized. No aortic stenosis is present. Pulmonic Valve: The pulmonic valve was not well visualized. Pulmonic valve regurgitation is not visualized. Aorta: The aortic root and ascending aorta are structurally normal, with no evidence of dilitation. Venous: The inferior vena cava is normal in size with greater than 50% respiratory variability, suggesting right atrial pressure of 3 mmHg. IAS/Shunts: The interatrial septum was not well visualized.  LEFT VENTRICLE PLAX 2D LVIDd:         4.30 cm  Diastology LVIDs:         2.50 cm  LV e' lateral:   13.30 cm/s LV PW:         1.00 cm  LV E/e' lateral: 6.4 LV IVS:        0.90 cm  LV e' medial:    12.40 cm/s LVOT diam:     1.80 cm  LV E/e' medial:  6.9 LV SV:  71 LV SV Index:   31       2D Longitudinal Strain LVOT Area:     2.54 cm 2D Strain GLS Avg:     -21.5 %  RIGHT VENTRICLE RV S prime:     11.20 cm/s TAPSE (M-mode): 2.3 cm LEFT ATRIUM             Index       RIGHT ATRIUM           Index LA diam:        3.70 cm 2.10 cm/m  RA Area:     14.80 cm LA Vol (A2C):   27.2 ml  15.42 ml/m RA Volume:   37.00 ml  20.97 ml/m LA Vol (A4C):   22.5 ml 12.75 ml/m LA Biplane Vol: 26.4 ml 14.97 ml/m  AORTIC VALVE LVOT Vmax:   111.00 cm/s LVOT Vmean:  72.100 cm/s LVOT VTI:    0.218 m  AORTA Ao Root diam: 2.50 cm MITRAL VALVE MV Area (PHT): 2.99 cm    SHUNTS MV Decel Time: 254 msec    Systemic VTI:  0.22 m MV E velocity: 85.30 cm/s  Systemic Diam: 1.80 cm MV A velocity: 73.30 cm/s MV E/A ratio:  1.16 Oswaldo Milian MD Electronically signed by Oswaldo Milian MD Signature Date/Time: 06/13/2020/12:23:29 PM    Final    MM CLIP PLACEMENT LEFT  Result Date: 06/23/2020 CLINICAL DATA:  34 year old female with recently diagnosed left breast cancer. Patient presents for MR guided biopsy to determine extent of disease. EXAM: DIAGNOSTIC LEFT MAMMOGRAM POST MRI BIOPSY COMPARISON:  Previous exam(s). FINDINGS: Mammographic images were obtained following MRI guided biopsy of non mass enhancement in the upper outer left breast anterior depth. The barbell biopsy marking clip is in expected position at the site of biopsy. IMPRESSION: Appropriate positioning of the barbell shaped biopsy marking clip at the site of biopsy in the upper outer left breast anterior depth. Final Assessment: Post Procedure Mammograms for Marker Placement Electronically Signed   By: Audie Pinto M.D.   On: 06/23/2020 09:48   MR LT BREAST BX W LOC DEV 1ST LESION IMAGE BX SPEC MR GUIDE  Addendum Date: 06/24/2020   ADDENDUM REPORT: 06/24/2020 14:11 ADDENDUM: Pathology revealed BENIGN BREAST PARENCHYMA WITH MILD NONSPECIFIC INFLAMMATION of the Left breast, upper outer anterior. This was found to be concordant by Dr. Audie Pinto. Pathology results were discussed with the patient by telephone. The patient reported doing well after the biopsy with tenderness at the site. Post biopsy instructions and care were reviewed and questions were answered. The patient was encouraged to call [facility] for any additional concerns. My direct phone number was provided. The patient has a recent diagnosis of Left breast cancer and should follow her outlined treatment plan. Dr. Erroll Luna and Dr. Gunnar Bulla Magrinat were notified of biopsy results via EPIC message on June 24, 2020. Pathology results reported by Terie Purser, RN on 06/24/2020. Electronically Signed   By: Audie Pinto M.D.   On: 06/24/2020 14:11   Result Date: 06/24/2020 CLINICAL DATA:  34 year old female with recently diagnosed left breast cancer. Patient presents for additional biopsy to determine extent of disease. EXAM: MRI GUIDED CORE NEEDLE BIOPSY OF THE LEFT BREAST TECHNIQUE: Multiplanar, multisequence MR imaging of the left breast was performed both before and after administration of intravenous contrast. CONTRAST:  40m GADAVIST GADOBUTROL 1 MMOL/ML IV SOLN COMPARISON:  Previous exams. FINDINGS: I met with the patient, and we discussed the  procedure of MRI guided biopsy, including risks, benefits, and alternatives. Specifically, we discussed the risks of infection, bleeding, tissue injury, clip migration, and inadequate sampling. Informed, written consent was given. The usual time out protocol was performed immediately prior to the procedure. Using sterile technique, 1% Lidocaine, MRI guidance, and a 9 gauge vacuum assisted device, biopsy was performed of non mass enhancement in the upper outer left breast anterior depth using a lateral approach. At the conclusion of the procedure, a a barbell tissue marker clip was deployed into the biopsy cavity. Follow-up 2-view mammogram was performed and dictated separately. IMPRESSION: MRI guided biopsy of non mass enhancement in the upper outer left breast anterior depth. No apparent complications. Electronically Signed: By: Audie Pinto M.D. On: 06/23/2020 09:46     ELIGIBLE FOR AVAILABLE RESEARCH PROTOCOL: no  ASSESSMENT: 34 y.o. [place] woman status post left breast upper  outer quadrant biopsy 05/29/2020 for a clinical T2 N0, stage IIB invasive ductal carcinoma, functionally triple negative, with an MIB-1 of 95%.  (1) genetics testing in process  (2) neoadjuvant chemotherapy will consist of cyclophosphamide and doxorubicin in dose dense fashion x4 starting 06/19/2020, to be followed by paclitaxel and carboplatin weekly x12  (a) Echocardiogram on 06/13/2020 showed an EF of 60-65%  (3) definitive surgery to follow  (4) postmastectomy radiation as appropriate   PLAN: Jeanne Evans is here for follow up and treatment of her triple negative breast cancer after undergoing her first cycle of neoadjuvant chemotherapy with Doxorubicin and Cyclophosphamide.  She tolerated her treatment moderately well.  Her CBC was reviewed and she is not neutropenic today.  I reviewed this with her in detail.    Jeanne Evans has struggled with some indigestion.  I recommended that she get OTC Pepcid and take this BID.  She plans on doing so.    Her taste changes are a common concern during chemotherapy, these can worsen with the indigestion as well.  She will take the Pepcid, and also will eat small meals that taste good to her.  Another option to consider is adding a drop of vinegar to her food when cooking.    Jeanne Evans's main concern is the residual pain in her breast following her breast biopsy.  I do not see any sign of infection today on exam.  I did undress it and cover it with a band-aid.  The breast is inflamed in this area.  She will continue to take tylenol or advil for the pain.  It should slowly improve, however, I reviewed what to look for in regards to infection, so that she can alert [place] if it changes.    I recommended Jeanne Evans continue to remain active.  Over the next week or so, she should start to become more acclimated to the port, and she should be able to return to work if she wishes to do so.    We will see Jeanne Evans in 1 week for labs, f/u, and cycle two of her  neoadjuvant chemotherapy.  She knows to call for any questions that may arise between now and her next appointment.  We are happy to see her sooner if needed.   Total encounter time 3 minutes.Jeanne Bihari, NP 06/29/20 11:52 AM Medical Oncology and Hematology [facility] [place], [place] [address] Tel. [phone]    Fax. [phone]   *Total Encounter Time as defined by the Centers for Medicare and Medicaid Services includes, in addition to the face-to-face time of a patient visit (  documented in the note above) non-face-to-face time: obtaining and reviewing outside history, ordering and reviewing medications, tests or procedures, care coordination (communications with other health care professionals or caregivers) and documentation in the medical record.

## 2020-06-27 NOTE — Progress Notes (Signed)
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  Patient approved for the grant. Discussed in detail expenses and how they are covered. Gave her a copy of the approval letter as well as the expense sheet along with the Outpatient pharmacy. She received a gas card today from her grant.  She has my card for any additional financial questions or concerns.

## 2020-06-27 NOTE — Patient Instructions (Signed)

## 2020-06-30 ENCOUNTER — Telehealth: Payer: Self-pay | Admitting: Adult Health

## 2020-06-30 ENCOUNTER — Encounter: Payer: Self-pay | Admitting: *Deleted

## 2020-06-30 NOTE — Telephone Encounter (Signed)
No 8/13 los. No changes made to pt's schedule.

## 2020-07-01 ENCOUNTER — Telehealth: Payer: Self-pay | Admitting: Oncology

## 2020-07-01 NOTE — Telephone Encounter (Signed)
Scheduled appointments per 8/16 scheduling message. Patient is aware of appointments dates and times.

## 2020-07-03 ENCOUNTER — Ambulatory Visit: Payer: 59

## 2020-07-03 ENCOUNTER — Other Ambulatory Visit: Payer: 59

## 2020-07-08 ENCOUNTER — Other Ambulatory Visit: Payer: Self-pay

## 2020-07-08 ENCOUNTER — Inpatient Hospital Stay: Payer: 59

## 2020-07-08 ENCOUNTER — Inpatient Hospital Stay (HOSPITAL_BASED_OUTPATIENT_CLINIC_OR_DEPARTMENT_OTHER): Payer: 59 | Admitting: Oncology

## 2020-07-08 ENCOUNTER — Encounter: Payer: Self-pay | Admitting: *Deleted

## 2020-07-08 VITALS — BP 144/90 | HR 88 | Temp 97.4°F | Resp 18 | Ht 63.5 in | Wt 159.3 lb

## 2020-07-08 DIAGNOSIS — Z170 Estrogen receptor positive status [ER+]: Secondary | ICD-10-CM | POA: Diagnosis not present

## 2020-07-08 DIAGNOSIS — Z5111 Encounter for antineoplastic chemotherapy: Secondary | ICD-10-CM | POA: Diagnosis not present

## 2020-07-08 DIAGNOSIS — C50412 Malignant neoplasm of upper-outer quadrant of left female breast: Secondary | ICD-10-CM | POA: Diagnosis not present

## 2020-07-08 DIAGNOSIS — Z95828 Presence of other vascular implants and grafts: Secondary | ICD-10-CM

## 2020-07-08 LAB — CBC WITH DIFFERENTIAL/PLATELET
Abs Immature Granulocytes: 0.01 10*3/uL (ref 0.00–0.07)
Basophils Absolute: 0 10*3/uL (ref 0.0–0.1)
Basophils Relative: 1 %
Eosinophils Absolute: 0 10*3/uL (ref 0.0–0.5)
Eosinophils Relative: 0 %
HCT: 33.8 % — ABNORMAL LOW (ref 36.0–46.0)
Hemoglobin: 11.2 g/dL — ABNORMAL LOW (ref 12.0–15.0)
Immature Granulocytes: 0 %
Lymphocytes Relative: 34 %
Lymphs Abs: 1.1 10*3/uL (ref 0.7–4.0)
MCH: 29.3 pg (ref 26.0–34.0)
MCHC: 33.1 g/dL (ref 30.0–36.0)
MCV: 88.5 fL (ref 80.0–100.0)
Monocytes Absolute: 0.4 10*3/uL (ref 0.1–1.0)
Monocytes Relative: 13 %
Neutro Abs: 1.6 10*3/uL — ABNORMAL LOW (ref 1.7–7.7)
Neutrophils Relative %: 52 %
Platelets: 344 10*3/uL (ref 150–400)
RBC: 3.82 MIL/uL — ABNORMAL LOW (ref 3.87–5.11)
RDW: 14.7 % (ref 11.5–15.5)
WBC: 3.1 10*3/uL — ABNORMAL LOW (ref 4.0–10.5)
nRBC: 0 % (ref 0.0–0.2)

## 2020-07-08 LAB — COMPREHENSIVE METABOLIC PANEL
ALT: 16 U/L (ref 0–44)
AST: 11 U/L — ABNORMAL LOW (ref 15–41)
Albumin: 3.8 g/dL (ref 3.5–5.0)
Alkaline Phosphatase: 64 U/L (ref 38–126)
Anion gap: 8 (ref 5–15)
BUN: 10 mg/dL (ref 6–20)
CO2: 27 mmol/L (ref 22–32)
Calcium: 9.7 mg/dL (ref 8.9–10.3)
Chloride: 106 mmol/L (ref 98–111)
Creatinine, Ser: 0.86 mg/dL (ref 0.44–1.00)
GFR calc Af Amer: >60 mL/min (ref >60)
GFR calc non Af Amer: >60 mL/min (ref >60)
Glucose, Bld: 128 mg/dL — ABNORMAL HIGH (ref 70–99)
Potassium: 4 mmol/L (ref 3.5–5.1)
Sodium: 141 mmol/L (ref 135–145)
Total Bilirubin: 0.4 mg/dL (ref 0.3–1.2)
Total Protein: 7.2 g/dL (ref 6.5–8.1)

## 2020-07-08 MED ORDER — SODIUM CHLORIDE 0.9% FLUSH
10.0000 mL | INTRAVENOUS | Status: DC | PRN
  Administered 2020-07-08: 10 mL via INTRAVENOUS
  Filled 2020-07-08: qty 10

## 2020-07-08 MED ORDER — SODIUM CHLORIDE 0.9 % IV SOLN
10.0000 mg | Freq: Once | INTRAVENOUS | Status: AC
  Administered 2020-07-08: 10 mg via INTRAVENOUS
  Filled 2020-07-08: qty 10

## 2020-07-08 MED ORDER — DOCUSATE SODIUM 100 MG PO CAPS
200.0000 mg | ORAL_CAPSULE | Freq: Every day | ORAL | 0 refills | Status: DC
Start: 2020-07-08 — End: 2020-10-07

## 2020-07-08 MED ORDER — DOXORUBICIN HCL CHEMO IV INJECTION 2 MG/ML
60.0000 mg/m2 | Freq: Once | INTRAVENOUS | Status: AC
  Administered 2020-07-08: 108 mg via INTRAVENOUS
  Filled 2020-07-08: qty 54

## 2020-07-08 MED ORDER — SODIUM CHLORIDE 0.9 % IV SOLN
150.0000 mg | Freq: Once | INTRAVENOUS | Status: AC
  Administered 2020-07-08: 150 mg via INTRAVENOUS
  Filled 2020-07-08: qty 150

## 2020-07-08 MED ORDER — HEPARIN SOD (PORK) LOCK FLUSH 100 UNIT/ML IV SOLN
500.0000 [IU] | Freq: Once | INTRAVENOUS | Status: AC | PRN
  Administered 2020-07-08: 500 [IU]
  Filled 2020-07-08: qty 5

## 2020-07-08 MED ORDER — SODIUM CHLORIDE 0.9% FLUSH
10.0000 mL | INTRAVENOUS | Status: DC | PRN
  Administered 2020-07-08: 10 mL
  Filled 2020-07-08: qty 10

## 2020-07-08 MED ORDER — SODIUM CHLORIDE 0.9 % IV SOLN
Freq: Once | INTRAVENOUS | Status: AC
  Filled 2020-07-08: qty 250

## 2020-07-08 MED ORDER — VENLAFAXINE HCL ER 75 MG PO CP24
75.0000 mg | ORAL_CAPSULE | Freq: Every day | ORAL | 6 refills | Status: DC
Start: 2020-07-08 — End: 2021-01-07

## 2020-07-08 MED ORDER — SODIUM CHLORIDE 0.9 % IV SOLN
600.0000 mg/m2 | Freq: Once | INTRAVENOUS | Status: AC
  Administered 2020-07-08: 1080 mg via INTRAVENOUS
  Filled 2020-07-08: qty 54

## 2020-07-08 MED ORDER — SODIUM CHLORIDE 0.9 % IV SOLN
16.0000 mg | Freq: Once | INTRAVENOUS | Status: AC
  Administered 2020-07-08: 16 mg via INTRAVENOUS
  Filled 2020-07-08: qty 8

## 2020-07-08 MED ORDER — POLYETHYLENE GLYCOL 3350 17 GM/SCOOP PO POWD: 1.0000 | Freq: Once | ORAL | 0 refills | Status: AC

## 2020-07-08 NOTE — Patient Instructions (Signed)
[facility] Discharge Instructions for Patients Receiving Chemotherapy  Today you received the following chemotherapy agents: doxorubicin and cyclophosphamide.  To help prevent nausea and vomiting after your treatment, we encourage you to take your nausea medication as directed.   If you develop nausea and vomiting that is not controlled by your nausea medication, call the clinic.   BELOW ARE SYMPTOMS THAT SHOULD BE REPORTED IMMEDIATELY:  *FEVER GREATER THAN 100.5 F  *CHILLS WITH OR WITHOUT FEVER  NAUSEA AND VOMITING THAT IS NOT CONTROLLED WITH YOUR NAUSEA MEDICATION  *UNUSUAL SHORTNESS OF BREATH  *UNUSUAL BRUISING OR BLEEDING  TENDERNESS IN MOUTH AND THROAT WITH OR WITHOUT PRESENCE OF ULCERS  *URINARY PROBLEMS  *BOWEL PROBLEMS  UNUSUAL RASH Items with * indicate a potential emergency and should be followed up as soon as possible.  Feel free to call the clinic should you have any questions or concerns. The clinic phone number is [phone].  Please show the CHEMO ALERT CARD at check-in to the Emergency Department and triage nurse.   

## 2020-07-08 NOTE — Addendum Note (Signed)
Addended by: Chauncey Cruel on: 07/08/2020 04:57 PM   Modules accepted: Orders

## 2020-07-08 NOTE — Progress Notes (Signed)
[place]  [phone] Fax:([phone]) [phone]     ID: Jeanne Evans DOB: September 20, 1986  MR#: [id number]  OTR#:[id number]  Patient Care Team: [name], MD as PCP - General (Obstetrics and Gynecology) Rockwell Germany, RN as Oncology Nurse Navigator Mauro Kaufmann, RN as Oncology Nurse Navigator Erroll Luna, MD as Consulting Physician (General Surgery) Wayne Wicklund, Virgie Dad, MD as Consulting Physician (Oncology) Kyung Rudd, MD as Consulting Physician (Radiation Oncology) Servando Salina, MD as Consulting Physician (Obstetrics and Gynecology) Chauncey Cruel, MD OTHER MD:  CHIEF COMPLAINT: 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   She started neoadjuvant chemotherapy on 06/19/2020.  This consists of dose dense doxorubicin and cyclophosphamide given on day 1 of each 14 day cycle with growth factor support at home (injectable Ziextenzo) x 4 cycles. Today is cycle 2 day 1 of therapy.   REVIEW OF SYSTEMS: [name] did generally quite well with her first cycle of chemotherapy.  She did feel tired particularly her legs.  1 day she just could not get out of bed.  She felt dizzy.  She had blurred vision.  She is very emotional.  She just does not think she is going to be able to work.  In addition she had constipation decreased appetite and altered taste.  She has not had any mouth sores, port has worked well, she is keeping herself well-hydrated, and she has not yet begun to lose her hair.  A detailed review of systems today was otherwise stable   HISTORY OF CURRENT ILLNESS: From the original intake note:  Jeanne Evans herself palpated a painful left breast lump. She underwent bilateral diagnostic mammography with tomography and left breast ultrasonography at [facility] on 05/12/2020  showing: breast density category B; palpable 2.7 cm oval hypoechoic mass in left breast at 2-3 o'clock; 2.2 cm left axillary lymph node/grouping of nodes.  Accordingly on 05/29/2020 she proceeded to biopsy of the left breast area in question. The pathology from this procedure ([id number]) showed: invasive ductal carcinoma, grade 3. Prognostic indicators significant for: estrogen receptor, 10% positive with weak staining intensity and progesterone receptor, 0% negative. Proliferation marker Ki67 at 95%. HER2 equivocal by immunohistochemistry (2+), but negative by fluorescent in situ hybridization with a signals ratio 1.77 and number per cell 2.65.  Biopsy of the left axillary lymph node in question was negative and concordant  The patient's subsequent history is as detailed below.   PAST MEDICAL HISTORY: Past Medical History:  Diagnosis Date  . Breast cancer ([place])   . Family history of ovarian cancer   . Family history of prostate cancer   . History of asthma    has albuterol, uses PRN  . History of heart murmur in childhood   . History of multiple allergies   History of allergy related headaches   PAST SURGICAL HISTORY: Past Surgical History:  Procedure Laterality Date  . PORTACATH PLACEMENT Right 06/18/2020   Procedure: INSERTION PORT-A-CATH WITH ULTRASOUND GUIDANCE;  Surgeon: Erroll Luna, MD;  Location: [place];  Service: General;  Laterality: Right;    She reports having a boil lanced from her perineal area   FAMILY HISTORY: Family History  Problem Relation Age of Onset  . Lung cancer Maternal Uncle        dx late 33s  . Prostate cancer Paternal Uncle  dx late 45s  . Ovarian cancer Paternal Grandmother        dx 26s  . Prostate cancer Paternal Grandfather        dx 44s  . Prostate cancer Paternal Uncle        dx early 58s  The patient's father is 91 and her mother 60, as of 05/2020.  The patient has two brothers and one sister. She reports  ovarian cancer in her paternal grandmother, prostate cancer in her paternal grandfather and a paternal uncle, and lung cancer in a maternal uncle.   GYNECOLOGIC HISTORY:  No LMP recorded. (Menstrual status: IUD). Menarche: 34 years old Age at first live birth: 34 years old [place] P 2 LMP current, experiences spotting Contraceptive: Mirena IUD in place HRT n/a  Hysterectomy? no BSO? 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 She describes herself as single. She lives at home with her children, Jeanne Evans (age 59) and Jeanne Evans (age 25).     ADVANCED DIRECTIVES: Not in place. She intends to name her mother, Jeanne Evans, as her HCPOA.  [name] lives in [place] and can be reached at [phone].  The patient was given the appropriate documents to complete and notarized at her discretion at the time of her visit 06/04/2020   HEALTH MAINTENANCE: Social History   Tobacco Use  . Smoking status: Never Smoker  . Smokeless tobacco: Never Used  Substance Use Topics  . Alcohol use: Yes    Comment: occas  . Drug use: Yes    Types: Marijuana     Colonoscopy: n/a (age)  PAP: 04/2020  Bone density: n/a (age)   Allergies  Allergen Reactions  . Sulfa Antibiotics Hives    Current Outpatient Medications  Medication Sig Dispense Refill  . albuterol (VENTOLIN HFA) 108 (90 Base) MCG/ACT inhaler Inhale into the lungs every 6 (six) hours as needed for wheezing or shortness of breath.    . dexamethasone (DECADRON) 4 MG tablet Take 2 tablets by mouth daily starting the day after Carboplatin and Cytoxan x 3 days. Take with food. 30 tablet 1  . docusate sodium (COLACE) 100 MG capsule Take 2 capsules (200 mg total) by mouth daily. 10 capsule 0  . ibuprofen (ADVIL) 800 MG tablet Take 1 tablet (800 mg total) by mouth every 8 (eight) hours as needed. 30 tablet 0  . levonorgestrel (MIRENA) 20 MCG/24HR  IUD 1 each by Intrauterine route once.    . lidocaine-prilocaine (EMLA) cream Apply to affected area once 30 g 3  . LORazepam (ATIVAN) 0.5 MG tablet Take 1 tablet (0.5 mg total) by mouth at bedtime as needed (Nausea or vomiting). 20 tablet 0  . oxyCODONE (OXY IR/ROXICODONE) 5 MG immediate release tablet Take 1 tablet (5 mg total) by mouth every 6 (six) hours as needed for severe pain. 15 tablet 0  . pegfilgrastim-bmez (ZIEXTENZO) 6 MG/0.6ML injection Inject 0.6 mLs (6 mg total) into the skin every 14 (fourteen) days. 1 Syringe 3  . polyethylene glycol powder (MIRALAX) 17 GM/SCOOP powder Take 255 g by mouth once for 1 dose. 255 g 0  . prochlorperazine (COMPAZINE) 10 MG tablet Take 1 tablet (10 mg total) by mouth every 6 (six) hours as needed (Nausea or vomiting). 30 tablet 1  . venlafaxine XR (EFFEXOR-XR) 75 MG 24 hr capsule Take 1 capsule (75 mg total) by mouth daily with breakfast. 30 capsule 6   No  current facility-administered medications for this visit.   Facility-Administered Medications Ordered in Other Visits  Medication Dose Route Frequency Provider Last Rate Last Admin  . sodium chloride flush (NS) 0.9 % injection 10 mL  10 mL Intracatheter PRN Lorne Winkels, Virgie Dad, MD   10 mL at 07/08/20 1446    OBJECTIVE: African-American woman who appears stated age 70:   07/08/20 1036  BP: (!) 144/90  Pulse: 88  Resp: 18  Temp: (!) 97.4 F (36.3 C)  SpO2: 99%     Body mass index is 27.78 kg/m.   Wt Readings from Last 3 Encounters:  07/08/20 159 lb 4.8 oz (72.3 kg)  06/27/20 155 lb (70.3 kg)  06/18/20 158 lb 4.6 oz (71.8 kg)  ECOG FS:1 - Symptomatic but completely ambulatory  Sclerae unicteric, EOMs intact Wearing a mask No cervical or supraclavicular adenopathy Lungs no rales or rhonchi Heart regular rate and rhythm Abd soft, nontender, positive bowel sounds MSK no focal spinal tenderness, no upper extremity lymphedema Neuro: nonfocal, well oriented, appropriate affect Breasts:  The right breast is unremarkable.  The left breast is status post biopsy.  The mass is still palpable.  Left axilla is benign.   LAB RESULTS:  CMP     Component Value Date/Time   NA 141 07/08/2020 0955   K 4.0 07/08/2020 0955   CL 106 07/08/2020 0955   CO2 27 07/08/2020 0955   GLUCOSE 128 (H) 07/08/2020 0955   BUN 10 07/08/2020 0955   CREATININE 0.86 07/08/2020 0955   CREATININE 0.92 06/04/2020 0830   CALCIUM 9.7 07/08/2020 0955   PROT 7.2 07/08/2020 0955   ALBUMIN 3.8 07/08/2020 0955   AST 11 (L) 07/08/2020 0955   AST 13 (L) 06/04/2020 0830   ALT 16 07/08/2020 0955   ALT 14 06/04/2020 0830   ALKPHOS 64 07/08/2020 0955   BILITOT 0.4 07/08/2020 0955   BILITOT 0.5 06/04/2020 0830   GFRNONAA >60 07/08/2020 0955   GFRNONAA >60 06/04/2020 0830   GFRAA >60 07/08/2020 0955   GFRAA >60 06/04/2020 0830    No results found for: TOTALPROTELP, ALBUMINELP, A1GS, A2GS, BETS, BETA2SER, GAMS, MSPIKE, SPEI  Lab Results  Component Value Date   WBC 3.1 (L) 07/08/2020   NEUTROABS 1.6 (L) 07/08/2020   HGB 11.2 (L) 07/08/2020   HCT 33.8 (L) 07/08/2020   MCV 88.5 07/08/2020   PLT 344 07/08/2020    No results found for: LABCA2  No components found for: URKYHC623  No results for input(s): INR in the last 168 hours.  No results found for: LABCA2  No results found for: JSE831  No results found for: DVV616  No results found for: WVP710  No results found for: CA2729  No components found for: HGQUANT  No results found for: CEA1 / No results found for: CEA1   No results found for: AFPTUMOR  No results found for: CHROMOGRNA  No results found for: KPAFRELGTCHN, LAMBDASER, KAPLAMBRATIO (kappa/lambda light chains)  No results found for: HGBA, HGBA2QUANT, HGBFQUANT, HGBSQUAN (Hemoglobinopathy evaluation)   No results found for: LDH  No results found for: IRON, TIBC, IRONPCTSAT (Iron and TIBC)  No results found for: FERRITIN  Urinalysis No results found for: COLORURINE,  APPEARANCEUR, LABSPEC, PHURINE, GLUCOSEU, HGBUR, BILIRUBINUR, KETONESUR, PROTEINUR, UROBILINOGEN, NITRITE, LEUKOCYTESUR   STUDIES: CT CHEST W CONTRAST  Result Date: 06/13/2020 CLINICAL DATA:  Breast cancer staging EXAM: CT CHEST WITH CONTRAST TECHNIQUE: Multidetector CT imaging of the chest was performed during intravenous contrast administration. CONTRAST:  14m OMNIPAQUE IOHEXOL  300 MG/ML  SOLN COMPARISON:  None FINDINGS: Cardiovascular: Heart size is normal. No pericardial effusion. Central pulmonary vasculature is unremarkable on venous phase assessment. Aorta is of normal caliber without significant atherosclerosis. Mediastinum/Nodes: No thoracic inlet adenopathy. Small lymph nodes in the LEFT axilla, biopsy marker in place in the region of the LEFT axilla and also within a LEFT breast mass. LEFT breast mass in the LEFT breast laterally measuring 3.9 x 2.7 cm towards the upper outer quadrant. Small lymph node containing a marker measuring 8 mm short axis. There are other lymph nodes tracking beneath the pectoralis musculature largest measuring 7 mm short axis. Most of these lymph nodes retain fatty hila. Similar small lymph nodes are seen in the RIGHT axilla Enlargement of subcarinal, RIGHT paratracheal and by hilar lymph nodes. RIGHT hilar lymph node (image 62, series 2) 11 mm short axis Subcarinal lymph node (image 56, series 2) 12 mm short axis. (Image 48, series 2) 15 mm RIGHT hilar lymph node. No retrocrural adenopathy. Slightly smaller lymph nodes throughout the LEFT hilum and scattered about the RIGHT hilum as well. Small lymph nodes track into the RIGHT paratracheal chain, these are less than a cm. No enlarged internal mammary lymph nodes, a single un enlarged node is seen on image 41 of series 2 Lungs/Pleura: Bilateral upper lobe nodules, too numerous to count. These are small, less than a cm. Also involvement of the superior segment of the RIGHT lower lobe greater than LEFT with tiny 4 mm  nodules. (Image 70, series 7) 5 mm RIGHT lower lobe pulmonary nodule slightly inferior to superior segment. Airways are patent. There is some nodule seen along the fissure, major fissure in the RIGHT chest predominantly on the RIGHT. Upper Abdomen: Numerous liver lesions, greater than 220 lesions in the visualized portions of the liver. Visualized adrenal glands are normal. No acute upper abdominal process. Spleen is normal size. Musculoskeletal: No acute musculoskeletal finding or destructive bone lesion. IMPRESSION: 1. LEFT breast mass with mildly enlarged axillary lymph nodes and a rounded LEFT subpectoral lymph node as described. 2. Multiple ill-defined pulmonary nodules, signs of pleural nodularity and relatively symmetric bihilar and mediastinal adenopathy most suggestive of sarcoidosis in this patient with history of breast cancer. Biopsy may be required to exclude the possibility of metastatic disease though the pattern of upper lobe disease combined with the pattern of mediastinal disease appears atypical for breast cancer. 3. Multiple hepatic lesions may also require tissue sampling but could also be seen in the setting of metastatic disease or sarcoidosis. 4. Aortic atherosclerosis. Aortic Atherosclerosis (ICD10-I70.0). Electronically Signed   By: Zetta Bills M.D.   On: 06/13/2020 22:07   NM Bone Scan Whole Body  Result Date: 06/14/2020 CLINICAL DATA:  34 year old with a recent diagnosis of grade 3 invasive ductal carcinoma involving the LEFT breast. Initial staging. EXAM: NUCLEAR MEDICINE WHOLE BODY BONE SCAN TECHNIQUE: Whole body anterior and posterior images were obtained approximately 3 hours after intravenous injection of radiopharmaceutical. RADIOPHARMACEUTICALS:  19.6 mCi Technetium-59mMDP IV COMPARISON:  None. FINDINGS: No abnormal osseous activity in the axial or appendicular skeleton to suggest metastatic disease. Physiologic excretion of the radiopharmaceutical by the urinary tract.  IMPRESSION: Normal examination.  No evidence of osseous metastatic disease. Electronically Signed   By: TEvangeline DakinM.D.   On: 06/14/2020 15:32   MR BREAST BILATERAL W WO CONTRAST INC CAD  Result Date: 06/16/2020 CLINICAL DATA:  34year old with biopsy-proven grade 3 invasive ductal carcinoma involving the UPPER OUTER QUADRANT of the  LEFT breast (ER positive, PR negative, HER 2 Neu negative, Ki67 95%). Biopsy of a LEFT axillary lymph node with eccentric cortical thickening demonstrated no evidence of metastatic disease. Pretreatment MRI is performed to confirm extent of disease. LABS:  Not applicable. EXAM: BILATERAL BREAST MRI WITH AND WITHOUT CONTRAST TECHNIQUE: Multiplanar, multisequence MR images of both breasts were obtained prior to and following the intravenous administration of 7 ml of Gadavist. Three-dimensional MR images were rendered by post-processing of the original MR data on an independent workstation. The three-dimensional MR images were interpreted, and findings are reported in the following complete MRI report for this study. Three dimensional images were evaluated at the independent interpreting workstation using the DynaCAD thin client. COMPARISON:  No prior MRI. Recent mammography and LEFT breast ultrasound. CT chest 06/13/2020 is also correlated. FINDINGS: Breast composition: c. Heterogeneous fibroglandular tissue. Background parenchymal enhancement: Mild. Right breast: No evidence of mass or abnormal enhancement. Left breast: The palpable lump was marked with a Vitamin-E capsule. Heterogeneously enhancing mass in the outer breast, slight upper outer quadrant, biopsy-proven grade IDC, measuring approximately 3.5 X 3.4 X 3.9 cm (AP x transverse x craniocaudal), demonstrating rapid washout kinetics. Contiguous with and immediately anterior to the enhancing mass is stippled non-mass enhancement measuring approximately 2.1 x 2.6 x 2.7 cm, demonstrating progressive enhancement kinetics.  Lymph nodes: No pathologic lymphadenopathy. Ancillary findings:  None. IMPRESSION: 1. Biopsy-proven grade 3 invasive ductal carcinoma involving the outer LEFT breast at posterior depth, slight upper outer quadrant, with maximum measurement 3.9 cm by MRI. 2. Indeterminate stippled non-mass enhancement contiguous with and immediately anterior to the biopsy-proven malignancy in the outer LEFT breast. The non-mass enhancement in total is measured above and extends approximately 2.1 cm anterior to the mass. (Total AP span of the mass and non-mass enhancement is approximately 5.5 cm). 3. No MRI evidence of malignancy involving the RIGHT breast. 4. No pathologic lymphadenopathy. RECOMMENDATION: MRI guided biopsy of the stippled non-mass enhancement anterior to the biopsy-proven malignancy in the outer LEFT breast. BI-RADS CATEGORY  4: Suspicious. Electronically Signed   By: Evangeline Dakin M.D.   On: 06/16/2020 14:24   DG Chest Port 1 View  Result Date: 06/18/2020 CLINICAL DATA:  Portacath insertion EXAM: PORTABLE CHEST 1 VIEW COMPARISON:  Portable exam at 1253 hrs compared to CT chest of 06/13/2020 FINDINGS: RIGHT jugular Port-A-Cath with tip projecting over SVC. Upper normal heart size. Mediastinal contours and pulmonary vascularity normal. Prominent hila bilaterally corresponding to adenopathy on CT. Minimal RIGHT basilar atelectasis. Lungs otherwise clear. No acute infiltrate, pleural effusion, or pneumothorax. IMPRESSION: No pneumothorax following Port-A-Cath insertion. BILATERAL hilar enlargement corresponding to adenopathy seen on CT. Electronically Signed   By: Lavonia Dana M.D.   On: 06/18/2020 13:20   DG Fluoro Guide CV Line-No Report  Result Date: 06/18/2020 Fluoroscopy was utilized by the requesting physician.  No radiographic interpretation.   ECHOCARDIOGRAM COMPLETE  Result Date: 06/13/2020    ECHOCARDIOGRAM REPORT   Patient Name:   NONI STONESIFER Date of Exam: 06/13/2020 Medical Rec #:   [id number]          Height:       63.5 in Accession #:    [id number]         Weight:       159.0 lb Date of Birth:  09-Dec-1985         BSA:          1.764 m Patient Age:    34 years  BP:           141/84 mmHg Patient Gender: F                  HR:           77 bpm. Exam Location:  Outpatient Procedure: 2D Echo Indications:    Chemotherapy Evaluation V58.11  History:        Patient has no prior history of Echocardiogram examinations.                 Breast Cancer.  Sonographer:    Mikki Santee RDCS (AE) Referring Phys: [place]  1. Left ventricular ejection fraction, by estimation, is 60 to 65%. The left ventricle has normal function. The left ventricle has no regional wall motion abnormalities. There is mild left ventricular hypertrophy. Left ventricular diastolic parameters were normal. The average left ventricular global longitudinal strain is -21.5 %.  2. Right ventricular systolic function is normal. The right ventricular size is normal. Tricuspid regurgitation signal is inadequate for assessing PA pressure.  3. The mitral valve is normal in structure. Trivial mitral valve regurgitation.  4. The aortic valve is tricuspid. Aortic valve regurgitation is not visualized. No aortic stenosis is present.  5. The inferior vena cava is normal in size with greater than 50% respiratory variability, suggesting right atrial pressure of 3 mmHg. FINDINGS  Left Ventricle: Left ventricular ejection fraction, by estimation, is 60 to 65%. The left ventricle has normal function. The left ventricle has no regional wall motion abnormalities. The average left ventricular global longitudinal strain is -21.5 %. The left ventricular internal cavity size was normal in size. There is mild left ventricular hypertrophy. Left ventricular diastolic parameters were normal. Right Ventricle: The right ventricular size is normal. No increase in right ventricular wall thickness. Right ventricular systolic  function is normal. Tricuspid regurgitation signal is inadequate for assessing PA pressure. Left Atrium: Left atrial size was normal in size. Right Atrium: Right atrial size was normal in size. Pericardium: There is no evidence of pericardial effusion. Mitral Valve: The mitral valve is normal in structure. Trivial mitral valve regurgitation. Tricuspid Valve: The tricuspid valve is normal in structure. Tricuspid valve regurgitation is not demonstrated. Aortic Valve: The aortic valve is tricuspid. Aortic valve regurgitation is not visualized. No aortic stenosis is present. Pulmonic Valve: The pulmonic valve was not well visualized. Pulmonic valve regurgitation is not visualized. Aorta: The aortic root and ascending aorta are structurally normal, with no evidence of dilitation. Venous: The inferior vena cava is normal in size with greater than 50% respiratory variability, suggesting right atrial pressure of 3 mmHg. IAS/Shunts: The interatrial septum was not well visualized.  LEFT VENTRICLE PLAX 2D LVIDd:         4.30 cm  Diastology LVIDs:         2.50 cm  LV e' lateral:   13.30 cm/s LV PW:         1.00 cm  LV E/e' lateral: 6.4 LV IVS:        0.90 cm  LV e' medial:    12.40 cm/s LVOT diam:     1.80 cm  LV E/e' medial:  6.9 LV SV:         55 LV SV Index:   31       2D Longitudinal Strain LVOT Area:     2.54 cm 2D Strain GLS Avg:     -21.5 %  RIGHT VENTRICLE RV S prime:  11.20 cm/s TAPSE (M-mode): 2.3 cm LEFT ATRIUM             Index       RIGHT ATRIUM           Index LA diam:        3.70 cm 2.10 cm/m  RA Area:     14.80 cm LA Vol (A2C):   27.2 ml 15.42 ml/m RA Volume:   37.00 ml  20.97 ml/m LA Vol (A4C):   22.5 ml 12.75 ml/m LA Biplane Vol: 26.4 ml 14.97 ml/m  AORTIC VALVE LVOT Vmax:   111.00 cm/s LVOT Vmean:  72.100 cm/s LVOT VTI:    0.218 m  AORTA Ao Root diam: 2.50 cm MITRAL VALVE MV Area (PHT): 2.99 cm    SHUNTS MV Decel Time: 254 msec    Systemic VTI:  0.22 m MV E velocity: 85.30 cm/s  Systemic Diam: 1.80  cm MV A velocity: 73.30 cm/s MV E/A ratio:  1.16 Oswaldo Milian MD Electronically signed by Oswaldo Milian MD Signature Date/Time: 06/13/2020/12:23:29 PM    Final    MM CLIP PLACEMENT LEFT  Result Date: 06/23/2020 CLINICAL DATA:  34 year old female with recently diagnosed left breast cancer. Patient presents for MR guided biopsy to determine extent of disease. EXAM: DIAGNOSTIC LEFT MAMMOGRAM POST MRI BIOPSY COMPARISON:  Previous exam(s). FINDINGS: Mammographic images were obtained following MRI guided biopsy of non mass enhancement in the upper outer left breast anterior depth. The barbell biopsy marking clip is in expected position at the site of biopsy. IMPRESSION: Appropriate positioning of the barbell shaped biopsy marking clip at the site of biopsy in the upper outer left breast anterior depth. Final Assessment: Post Procedure Mammograms for Marker Placement Electronically Signed   By: Audie Pinto M.D.   On: 06/23/2020 09:48   MR LT BREAST BX W LOC DEV 1ST LESION IMAGE BX SPEC MR GUIDE  Addendum Date: 06/24/2020   ADDENDUM REPORT: 06/24/2020 14:11 ADDENDUM: Pathology revealed BENIGN BREAST PARENCHYMA WITH MILD NONSPECIFIC INFLAMMATION of the Left breast, upper outer anterior. This was found to be concordant by Dr. Audie Pinto. Pathology results were discussed with the patient by telephone. The patient reported doing well after the biopsy with tenderness at the site. Post biopsy instructions and care were reviewed and questions were answered. The patient was encouraged to call [facility] for any additional concerns. My direct phone number was provided. The patient has a recent diagnosis of Left breast cancer and should follow her outlined treatment plan. Dr. Erroll Luna and Dr. Gunnar Bulla Shereda Graw were notified of biopsy results via EPIC message on June 24, 2020. Pathology results reported by Terie Purser, RN on 06/24/2020. Electronically Signed   By: Audie Pinto M.D.   On: 06/24/2020 14:11   Result Date: 06/24/2020 CLINICAL DATA:  34 year old female with recently diagnosed left breast cancer. Patient presents for additional biopsy to determine extent of disease. EXAM: MRI GUIDED CORE NEEDLE BIOPSY OF THE LEFT BREAST TECHNIQUE: Multiplanar, multisequence MR imaging of the left breast was performed both before and after administration of intravenous contrast. CONTRAST:  77m GADAVIST GADOBUTROL 1 MMOL/ML IV SOLN COMPARISON:  Previous exams. FINDINGS: I met with the patient, and we discussed the procedure of MRI guided biopsy, including risks, benefits, and alternatives. Specifically, we discussed the risks of infection, bleeding, tissue injury, clip migration, and inadequate sampling. Informed, written consent was given. The usual time out protocol was performed immediately prior to the procedure. Using sterile  technique, 1% Lidocaine, MRI guidance, and a 9 gauge vacuum assisted device, biopsy was performed of non mass enhancement in the upper outer left breast anterior depth using a lateral approach. At the conclusion of the procedure, a a barbell tissue marker clip was deployed into the biopsy cavity. Follow-up 2-view mammogram was performed and dictated separately. IMPRESSION: MRI guided biopsy of non mass enhancement in the upper outer left breast anterior depth. No apparent complications. Electronically Signed: By: Audie Pinto M.D. On: 06/23/2020 09:46     ELIGIBLE FOR AVAILABLE RESEARCH PROTOCOL: no  ASSESSMENT: 34 y.o. [place] woman status post left breast upper outer quadrant biopsy 05/29/2020 for a clinical T2 N0, stage IIB invasive ductal carcinoma, functionally triple negative, with an MIB-1 of 95%.  (1) genetics testing   (a) No pathogenic variants detected in [facility] Multi-Cancer Panel. The Multi-Cancer Panel offered by [facility] includes sequencing and/or deletion duplication testing of the following 85 genes: AIP, ALK, APC, ATM,  AXIN2,BAP1,  BARD1, BLM, BMPR1A, BRCA1, BRCA2, BRIP1, CASR, CDC73, CDH1, CDK4, CDKN1B, CDKN1C, CDKN2A (p14ARF), CDKN2A (p16INK4a), CEBPA, CHEK2, CTNNA1, DICER1, DIS3L2, EGFR (c.2369C>T, p.Thr790Met variant only), EPCAM (Deletion/duplication testing only), FH, FLCN, GATA2, GPC3, GREM1 (Promoter region deletion/duplication testing only), HOXB13 (c.251G>A, p.Gly84Glu), HRAS, KIT, MAX, MEN1, MET, MITF (c.952G>A, p.Glu318Lys variant only), MLH1, MSH2, MSH3, MSH6, MUTYH, NBN, NF1, NF2, NTHL1, PALB2, PDGFRA, PHOX2B, PMS2, POLD1, POLE, POT1, PRKAR1A, PTCH1, PTEN, RAD50, RAD51C, RAD51D, RB1, RECQL4, RET, RNF43, RUNX1, SDHAF2, SDHA (sequence changes only), SDHB, SDHC, SDHD, SMAD4, SMARCA4, SMARCB1, SMARCE1, STK11, SUFU, TERC, TERT, TMEM127, TP53, TSC1, TSC2, VHL, WRN and WT1. The report date is June 23, 2020.   (2) neoadjuvant chemotherapy will consist of cyclophosphamide and doxorubicin in dose dense fashion x4 starting 06/19/2020, to be followed by paclitaxel and carboplatin weekly x12  (a) Echocardiogram on 06/13/2020 showed an EF of 60-65%  (3) definitive surgery to follow  (4) postmastectomy radiation as appropriate   PLAN: Lysbeth did generally well with her first cycle of chemotherapy and is proceeding to cycle #2.  She understands after today she will be halfway done with the more intensive portion of her treatment.  Even though she has not yet begun to lose her hair she is likely to lose most of it in the next week to 10 days.  She does not have a specific plan but is hoping not to lose her hair.  We talked about bananas, wraps, and hats.  She is not interested in weeks.  She is not going to be able to work.  She tells me she will not qualify for temporary disability because of the job she has but she will bring [place] papers for home leave and she may qualify for Social Security disability on a temporary basis.  We will be glad to help her fill out those papers.  She is going to try MiraLAX and a  stool softener beginning on day 1 of each chemotherapy cycle and continuing at least for 4 days or until she is back to regular bowel movements.  I am also starting her on venlafaxine given her report of being emotional and irritable.  She has a good understanding of the possible side effects toxicities and complications of that agent and will let us know if any problems develop.  She knows to give herself the fill gastrium shots for 5 days beginning 2 days from now.  Otherwise she will see [place] again in 2 weeks.  She knows to call for any problems that may develop  before then.  Total encounter time 35 minutes.Sarajane Jews C. Lena Gores, MD 07/08/20 3:01 PM Medical Oncology and Hematology [facility] [place], [place] [address] Tel. [phone]    Fax. [phone]   I, Wilburn Mylar, am acting as scribe for Dr. Virgie Dad. Mitchell Iwanicki.  I, Lurline Del MD, have reviewed the above documentation for accuracy and completeness, and I agree with the above.    *Total Encounter Time as defined by the Centers for Medicare and Medicaid Services includes, in addition to the face-to-face time of a patient visit (documented in the note above) non-face-to-face time: obtaining and reviewing outside history, ordering and reviewing medications, tests or procedures, care coordination (communications with other health care professionals or caregivers) and documentation in the medical record.

## 2020-07-08 NOTE — Patient Instructions (Signed)

## 2020-07-09 ENCOUNTER — Telehealth: Payer: Self-pay | Admitting: Oncology

## 2020-07-09 NOTE — Telephone Encounter (Signed)
Added appt with APP per 8/24 LOS. Made no changes to pt's arrival time

## 2020-07-14 ENCOUNTER — Other Ambulatory Visit: Payer: Self-pay | Admitting: Oncology

## 2020-07-14 DIAGNOSIS — Z170 Estrogen receptor positive status [ER+]: Secondary | ICD-10-CM

## 2020-07-14 DIAGNOSIS — C50412 Malignant neoplasm of upper-outer quadrant of left female breast: Secondary | ICD-10-CM

## 2020-07-15 MED ORDER — LORAZEPAM 0.5 MG PO TABS: 0.5000 mg | ORAL_TABLET | Freq: Every evening | ORAL | 0 refills | Status: DC | PRN

## 2020-07-16 ENCOUNTER — Telehealth: Payer: Self-pay | Admitting: *Deleted

## 2020-07-16 NOTE — Telephone Encounter (Signed)
This RN spoke with pt per her call stating onset yesterday of " left arm and leg pain "  She states the pain first started in her arm " like an aching that went to my bone "  She states thru the night the pain increased to now her left hip and leg.  She states " I feel like when I stand I cannot put good weight on my left leg "  She denies: Cardiac concerns of SOB, chest discomfort, clamminess No noted deficits of use of arm or leg " just hurting bad "  No changes in sensorium or speech.  Saharra has not taken anything for the above at this time.  Note pt received her GSF- support per home injection on 07/10/2020.  This RN discuss above may be related to how the injection increases production of WBC's and can cause bone pain.  Recommended and verified pt had available - claritin ( generic ) tylenol and ibuprofen- and instructed her to take 1 claritin now and daily until she sees [place]- as well as  2 tylenol and 1 ibuprofen. Informed her she can take the tylenol and ibuprofen every 6 hours as needed.  This RN also requested for pt to call this afternoon with update on her symptoms for further recommendations.  Of note pt's mother is currently present in the home.  Azalynn verbalized understanding of above.

## 2020-07-17 ENCOUNTER — Ambulatory Visit: Payer: 59

## 2020-07-17 ENCOUNTER — Other Ambulatory Visit: Payer: 59

## 2020-07-22 ENCOUNTER — Inpatient Hospital Stay: Payer: 59

## 2020-07-22 ENCOUNTER — Inpatient Hospital Stay (HOSPITAL_BASED_OUTPATIENT_CLINIC_OR_DEPARTMENT_OTHER): Payer: 59 | Admitting: Medical

## 2020-07-22 ENCOUNTER — Other Ambulatory Visit: Payer: Self-pay

## 2020-07-22 ENCOUNTER — Inpatient Hospital Stay: Payer: 59 | Attending: Oncology

## 2020-07-22 VITALS — BP 151/87 | HR 71 | Temp 98.5°F | Resp 18

## 2020-07-22 DIAGNOSIS — Z7952 Long term (current) use of systemic steroids: Secondary | ICD-10-CM | POA: Insufficient documentation

## 2020-07-22 DIAGNOSIS — J45909 Unspecified asthma, uncomplicated: Secondary | ICD-10-CM | POA: Insufficient documentation

## 2020-07-22 DIAGNOSIS — Z170 Estrogen receptor positive status [ER+]: Secondary | ICD-10-CM

## 2020-07-22 DIAGNOSIS — Z8041 Family history of malignant neoplasm of ovary: Secondary | ICD-10-CM | POA: Insufficient documentation

## 2020-07-22 DIAGNOSIS — Z5111 Encounter for antineoplastic chemotherapy: Secondary | ICD-10-CM | POA: Insufficient documentation

## 2020-07-22 DIAGNOSIS — G4452 New daily persistent headache (NDPH): Secondary | ICD-10-CM | POA: Diagnosis not present

## 2020-07-22 DIAGNOSIS — Z791 Long term (current) use of non-steroidal anti-inflammatories (NSAID): Secondary | ICD-10-CM | POA: Insufficient documentation

## 2020-07-22 DIAGNOSIS — C50412 Malignant neoplasm of upper-outer quadrant of left female breast: Secondary | ICD-10-CM

## 2020-07-22 DIAGNOSIS — Z8042 Family history of malignant neoplasm of prostate: Secondary | ICD-10-CM | POA: Insufficient documentation

## 2020-07-22 DIAGNOSIS — Z793 Long term (current) use of hormonal contraceptives: Secondary | ICD-10-CM | POA: Diagnosis not present

## 2020-07-22 DIAGNOSIS — Z171 Estrogen receptor negative status [ER-]: Secondary | ICD-10-CM | POA: Diagnosis not present

## 2020-07-22 DIAGNOSIS — Z801 Family history of malignant neoplasm of trachea, bronchus and lung: Secondary | ICD-10-CM | POA: Insufficient documentation

## 2020-07-22 DIAGNOSIS — Z95828 Presence of other vascular implants and grafts: Secondary | ICD-10-CM

## 2020-07-22 DIAGNOSIS — Z79899 Other long term (current) drug therapy: Secondary | ICD-10-CM | POA: Diagnosis not present

## 2020-07-22 LAB — CBC WITH DIFFERENTIAL/PLATELET
Abs Immature Granulocytes: 0.26 10*3/uL — ABNORMAL HIGH (ref 0.00–0.07)
Basophils Absolute: 0.1 10*3/uL (ref 0.0–0.1)
Basophils Relative: 1 %
Eosinophils Absolute: 0 10*3/uL (ref 0.0–0.5)
Eosinophils Relative: 0 %
HCT: 32.3 % — ABNORMAL LOW (ref 36.0–46.0)
Hemoglobin: 11.1 g/dL — ABNORMAL LOW (ref 12.0–15.0)
Immature Granulocytes: 4 %
Lymphocytes Relative: 23 %
Lymphs Abs: 1.5 10*3/uL (ref 0.7–4.0)
MCH: 30.5 pg (ref 26.0–34.0)
MCHC: 34.4 g/dL (ref 30.0–36.0)
MCV: 88.7 fL (ref 80.0–100.0)
Monocytes Absolute: 0.7 10*3/uL (ref 0.1–1.0)
Monocytes Relative: 11 %
Neutro Abs: 3.9 10*3/uL (ref 1.7–7.7)
Neutrophils Relative %: 61 %
Platelets: 166 10*3/uL (ref 150–400)
RBC: 3.64 MIL/uL — ABNORMAL LOW (ref 3.87–5.11)
RDW: 15.2 % (ref 11.5–15.5)
WBC: 6.4 10*3/uL (ref 4.0–10.5)
nRBC: 0.8 % — ABNORMAL HIGH (ref 0.0–0.2)

## 2020-07-22 LAB — COMPREHENSIVE METABOLIC PANEL
ALT: 26 U/L (ref 0–44)
AST: 13 U/L — ABNORMAL LOW (ref 15–41)
Albumin: 3.7 g/dL (ref 3.5–5.0)
Alkaline Phosphatase: 74 U/L (ref 38–126)
Anion gap: 6 (ref 5–15)
BUN: 11 mg/dL (ref 6–20)
CO2: 28 mmol/L (ref 22–32)
Calcium: 9 mg/dL (ref 8.9–10.3)
Chloride: 106 mmol/L (ref 98–111)
Creatinine, Ser: 0.8 mg/dL (ref 0.44–1.00)
GFR calc Af Amer: >60 mL/min (ref >60)
GFR calc non Af Amer: >60 mL/min (ref >60)
Glucose, Bld: 92 mg/dL (ref 70–99)
Potassium: 3.7 mmol/L (ref 3.5–5.1)
Sodium: 140 mmol/L (ref 135–145)
Total Bilirubin: <0.2 mg/dL — ABNORMAL LOW (ref 0.3–1.2)
Total Protein: 7 g/dL (ref 6.5–8.1)

## 2020-07-22 MED ORDER — ZOLMITRIPTAN 5 MG PO TABS: 5.0000 mg | ORAL_TABLET | ORAL | 2 refills | Status: DC | PRN

## 2020-07-22 MED ORDER — SODIUM CHLORIDE 0.9 % IV SOLN
10.0000 mg | Freq: Once | INTRAVENOUS | Status: AC
  Administered 2020-07-22: 10 mg via INTRAVENOUS
  Filled 2020-07-22: qty 10

## 2020-07-22 MED ORDER — ONDANSETRON HCL 4 MG/2ML IJ SOLN
INTRAMUSCULAR | Status: AC
  Filled 2020-07-22: qty 8

## 2020-07-22 MED ORDER — DOXORUBICIN HCL CHEMO IV INJECTION 2 MG/ML
60.0000 mg/m2 | Freq: Once | INTRAVENOUS | Status: AC
  Administered 2020-07-22: 108 mg via INTRAVENOUS
  Filled 2020-07-22: qty 54

## 2020-07-22 MED ORDER — SODIUM CHLORIDE 0.9% FLUSH
10.0000 mL | INTRAVENOUS | Status: DC | PRN
  Administered 2020-07-22: 10 mL via INTRAVENOUS
  Filled 2020-07-22: qty 10

## 2020-07-22 MED ORDER — SODIUM CHLORIDE 0.9% FLUSH
10.0000 mL | INTRAVENOUS | Status: DC | PRN
  Administered 2020-07-22: 10 mL
  Filled 2020-07-22: qty 10

## 2020-07-22 MED ORDER — SODIUM CHLORIDE 0.9 % IV SOLN
Freq: Once | INTRAVENOUS | Status: AC
  Filled 2020-07-22: qty 250

## 2020-07-22 MED ORDER — SODIUM CHLORIDE 0.9 % IV SOLN
600.0000 mg/m2 | Freq: Once | INTRAVENOUS | Status: AC
  Administered 2020-07-22: 1080 mg via INTRAVENOUS
  Filled 2020-07-22: qty 54

## 2020-07-22 MED ORDER — SODIUM CHLORIDE 0.9 % IV SOLN
150.0000 mg | Freq: Once | INTRAVENOUS | Status: AC
  Administered 2020-07-22: 150 mg via INTRAVENOUS
  Filled 2020-07-22: qty 150

## 2020-07-22 MED ORDER — HEPARIN SOD (PORK) LOCK FLUSH 100 UNIT/ML IV SOLN
500.0000 [IU] | Freq: Once | INTRAVENOUS | Status: AC | PRN
  Administered 2020-07-22: 500 [IU]
  Filled 2020-07-22: qty 5

## 2020-07-22 MED ORDER — SODIUM CHLORIDE 0.9 % IV SOLN
16.0000 mg | Freq: Once | INTRAVENOUS | Status: AC
  Administered 2020-07-22: 16 mg via INTRAVENOUS
  Filled 2020-07-22: qty 8

## 2020-07-22 NOTE — Progress Notes (Signed)
[facility] Progress Note   [name] [id number] 02/14/1986 34 y.o.  Jeanne Evans is managed by Dr. Lurline Del  Actively treated with chemotherapy/immunotherapy/hormonal therapy: yes  Current therapy: Cytoxan and Adriamycin  Last treated: 07/08/2020 (cycle 2, day 1)  Next scheduled appointment with provider: 08/05/2020  Assessment: Plan:    New daily persistent headache - Plan: zolmitriptan (ZOMIG) 5 MG tablet  Malignant neoplasm of upper-outer quadrant of left breast in female, estrogen receptor positive ([place])   Recurrent headaches: Patient was given a prescription for Zomig for possible migraine headaches.  ER positive malignant neoplasm of the left breast: [name] is receiving cycle 3, day 1 of Cytoxan and Adriamycin today.  She is scheduled to be seen by Dr. Jana Hakim in follow-up on 08/05/2020.  Please see After Visit Summary for patient specific instructions.  Future Appointments  Date Time Provider [place]  08/05/2020  9:15 AM [facility] None  08/05/2020  9:30 AM [facility] [place] FLUSH [facility] None  08/05/2020 10:00 AM Magrinat, Virgie Dad, MD [facility] None  08/05/2020 11:00 AM [facility] INFUSION [facility] None  08/19/2020  9:15 AM [facility] None  08/19/2020  9:30 AM [facility] [place] FLUSH [facility] None  08/19/2020 10:00 AM Magrinat, Virgie Dad, MD [facility] None  08/19/2020 11:00 AM [facility] INFUSION [facility] None  08/26/2020  9:45 AM [facility] LAB [facility] None  08/26/2020 10:00 AM [facility] [place] FLUSH [facility] None  08/26/2020 11:00 AM [facility] INFUSION [facility] None    No orders of the defined types were placed in this encounter.      Subjective:   Patient ID:  Jeanne Evans is a 34 y.o. (DOB 04/23/1986) female.  Chief Complaint: No chief complaint on file.   HPI [name]  is a 34 y.o. female with a diagnosis of an ER positive malignant  neoplasm of the left breast.  She is followed by Dr. Jana Hakim and is receiving cycle 3, day 1 of Cytoxan and Adriamycin today.  She reports that she has ongoing headaches despite her use of over-the-counter agents.  She is otherwise tolerating her treatment well with no acute issues of concern.  She denies nausea, vomiting, constipation, diarrhea, fevers, chills or sweats.   Medications: I have reviewed the patient's current medications.  Allergies:  Allergies  Allergen Reactions  . Sulfa Antibiotics Hives    Past Medical History:  Diagnosis Date  . Breast cancer ([place])   . Family history of ovarian cancer   . Family history of prostate cancer   . History of asthma    has albuterol, uses PRN  . History of heart murmur in childhood   . History of multiple allergies     Past Surgical History:  Procedure Laterality Date  . PORTACATH PLACEMENT Right 06/18/2020   Procedure: INSERTION PORT-A-CATH WITH ULTRASOUND GUIDANCE;  Surgeon: Erroll Luna, MD;  Location: [place];  Service: General;  Laterality: Right;    Family History  Problem Relation Age of Onset  . Lung cancer Maternal Uncle        dx late 67s  . Prostate cancer Paternal Uncle        dx late 21s  . Ovarian cancer Paternal Grandmother        dx 35s  . Prostate cancer Paternal Grandfather        dx 17s  . Prostate cancer Paternal Uncle        dx early 66s    Social History  Socioeconomic History  . Marital status: Unknown    Spouse name: Not on file  . Number of children: Not on file  . Years of education: Not on file  . Highest education level: Not on file  Occupational History  . Not on file  Tobacco Use  . Smoking status: Never Smoker  . Smokeless tobacco: Never Used  Substance and Sexual Activity  . Alcohol use: Yes    Comment: occas  . Drug use: Yes    Types: Marijuana  . Sexual activity: Not on file  Other Topics Concern  . Not on file  Social History Narrative  . Not on file    Social Determinants of Health   Financial Resource Strain:   . Difficulty of Paying Living Expenses: Not on file  Food Insecurity:   . Worried About Charity fundraiser in the Last Year: Not on file  . Ran Out of Food in the Last Year: Not on file  Transportation Needs:   . Lack of Transportation (Medical): Not on file  . Lack of Transportation (Non-Medical): Not on file  Physical Activity:   . Days of Exercise per Week: Not on file  . Minutes of Exercise per Session: Not on file  Stress:   . Feeling of Stress : Not on file  Social Connections:   . Frequency of Communication with Friends and Family: Not on file  . Frequency of Social Gatherings with Friends and Family: Not on file  . Attends Religious Services: Not on file  . Active Member of Clubs or Organizations: Not on file  . Attends Archivist Meetings: Not on file  . Marital Status: Not on file  Intimate Partner Violence:   . Fear of Current or Ex-Partner: Not on file  . Emotionally Abused: Not on file  . Physically Abused: Not on file  . Sexually Abused: Not on file    Past Medical History, Surgical history, Social history, and Family history were reviewed and updated as appropriate.   Please see review of systems for further details on the patient's review from today.   Review of Systems:  Review of Systems  Constitutional: Negative for chills, diaphoresis and fever.  HENT: Negative for trouble swallowing and voice change.   Respiratory: Negative for cough, chest tightness, shortness of breath and wheezing.   Cardiovascular: Negative for chest pain and palpitations.  Gastrointestinal: Negative for abdominal pain, constipation, diarrhea, nausea and vomiting.  Musculoskeletal: Negative for back pain and myalgias.  Neurological: Positive for headaches. Negative for dizziness and light-headedness.    Objective:   Physical Exam:  There were no vitals taken for this visit. ECOG: 0  Physical  Exam Constitutional:      General: She is not in acute distress.    Appearance: She is not diaphoretic.  HENT:     Head: Normocephalic and atraumatic.  Eyes:     General: No scleral icterus.       Right eye: No discharge.        Left eye: No discharge.     Conjunctiva/sclera: Conjunctivae normal.  Cardiovascular:     Rate and Rhythm: Normal rate and regular rhythm.     Heart sounds: Normal heart sounds. No murmur heard.  No friction rub. No gallop.   Pulmonary:     Effort: Pulmonary effort is normal. No respiratory distress.     Breath sounds: Normal breath sounds. No wheezing or rales.  Abdominal:     General: Abdomen is flat.  Bowel sounds are normal. There is no distension.     Palpations: Abdomen is soft.     Tenderness: There is no abdominal tenderness. There is no guarding.  Skin:    General: Skin is warm and dry.     Findings: No erythema or rash.  Neurological:     Mental Status: She is alert.  Psychiatric:        Mood and Affect: Mood normal.        Behavior: Behavior normal.        Thought Content: Thought content normal.        Judgment: Judgment normal.     Lab Review:     Component Value Date/Time   NA 140 07/22/2020 1136   K 3.7 07/22/2020 1136   CL 106 07/22/2020 1136   CO2 28 07/22/2020 1136   GLUCOSE 92 07/22/2020 1136   BUN 11 07/22/2020 1136   CREATININE 0.80 07/22/2020 1136   CREATININE 0.92 06/04/2020 0830   CALCIUM 9.0 07/22/2020 1136   PROT 7.0 07/22/2020 1136   ALBUMIN 3.7 07/22/2020 1136   AST 13 (L) 07/22/2020 1136   AST 13 (L) 06/04/2020 0830   ALT 26 07/22/2020 1136   ALT 14 06/04/2020 0830   ALKPHOS 74 07/22/2020 1136   BILITOT <0.2 (L) 07/22/2020 1136   BILITOT 0.5 06/04/2020 0830   GFRNONAA >60 07/22/2020 1136   GFRNONAA >60 06/04/2020 0830   GFRAA >60 07/22/2020 1136   GFRAA >60 06/04/2020 0830       Component Value Date/Time   WBC 6.4 07/22/2020 1136   RBC 3.64 (L) 07/22/2020 1136   HGB 11.1 (L) 07/22/2020 1136   HGB  12.2 06/04/2020 0830   HCT 32.3 (L) 07/22/2020 1136   PLT 166 07/22/2020 1136   PLT 290 06/04/2020 0830   MCV 88.7 07/22/2020 1136   MCH 30.5 07/22/2020 1136   MCHC 34.4 07/22/2020 1136   RDW 15.2 07/22/2020 1136   LYMPHSABS 1.5 07/22/2020 1136   MONOABS 0.7 07/22/2020 1136   EOSABS 0.0 07/22/2020 1136   BASOSABS 0.1 07/22/2020 1136   -------------------------------  Imaging from last 24 hours (if applicable):  Radiology interpretation: No results found.    OK to treat.  Sandi Mealy, MHS, PA-C

## 2020-07-22 NOTE — Patient Instructions (Signed)

## 2020-07-22 NOTE — Patient Instructions (Signed)
[facility] Discharge Instructions for Patients Receiving Chemotherapy  Today you received the following chemotherapy agents Adriamycin and Cytoxan  To help prevent nausea and vomiting after your treatment, we encourage you to take your nausea medication as directed.  If you develop nausea and vomiting that is not controlled by your nausea medication, call the clinic.   BELOW ARE SYMPTOMS THAT SHOULD BE REPORTED IMMEDIATELY:  *FEVER GREATER THAN 100.5 F  *CHILLS WITH OR WITHOUT FEVER  NAUSEA AND VOMITING THAT IS NOT CONTROLLED WITH YOUR NAUSEA MEDICATION  *UNUSUAL SHORTNESS OF BREATH  *UNUSUAL BRUISING OR BLEEDING  TENDERNESS IN MOUTH AND THROAT WITH OR WITHOUT PRESENCE OF ULCERS  *URINARY PROBLEMS  *BOWEL PROBLEMS  UNUSUAL RASH Items with * indicate a potential emergency and should be followed up as soon as possible.  Feel free to call the clinic should you have any questions or concerns. The clinic phone number is [phone].  Please show the CHEMO ALERT CARD at check-in to the Emergency Department and triage nurse.   

## 2020-07-31 ENCOUNTER — Other Ambulatory Visit: Payer: 59

## 2020-07-31 ENCOUNTER — Telehealth: Payer: Self-pay | Admitting: *Deleted

## 2020-07-31 ENCOUNTER — Other Ambulatory Visit: Payer: Self-pay | Admitting: *Deleted

## 2020-07-31 MED ORDER — OMEPRAZOLE 40 MG PO CPDR: 40.0000 mg | DELAYED_RELEASE_CAPSULE | Freq: Every day | ORAL | 0 refills | Status: DC

## 2020-07-31 NOTE — Telephone Encounter (Signed)
Jeanne Evans states she needs something for indigestion- feels like something is stuck in her throat. Has taken  Tagamet.   Dr Jana Hakim wants her to take Prilosec 40 mg at bedtime for 1 week

## 2020-08-04 ENCOUNTER — Telehealth: Payer: Self-pay | Admitting: *Deleted

## 2020-08-04 NOTE — Telephone Encounter (Signed)
This RN spoke with pt per her call stating onset yesterday of neck and shoulder pain that radiates down below her chest.  " it kind of feels like my head is too heavy setting on neck "  She has taken tylenol and ibuprofen and used heat with some minimal relief.  Pt denies headache or other issues.  Per above this RN discussed possible benefit with ice for any inflammation as well as to take an ativan to help her relax and see if that decreases symptoms.  She is scheduled to see [place] tomorrow.  Note ativan needed refills- this RN called in refills per above.

## 2020-08-04 NOTE — Progress Notes (Signed)
[place]  [phone] Fax:([phone]) [phone]     ID: Jeanne Evans DOB: 07-May-1986  MR#: [id number]  BVQ#:[id number]  Patient Care Team: [name], MD as PCP - General (Obstetrics and Gynecology) Rockwell Germany, RN as Oncology Nurse Navigator Mauro Kaufmann, RN as Oncology Nurse Navigator Erroll Luna, MD as Consulting Physician (General Surgery) Heavenlee Maiorana, Virgie Dad, MD as Consulting Physician (Oncology) Kyung Rudd, MD as Consulting Physician (Radiation Oncology) Servando Salina, MD as Consulting Physician (Obstetrics and Gynecology) Chauncey Cruel, MD OTHER MD:  CHIEF COMPLAINT: 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  She is accompanied by her father  She started neoadjuvant chemotherapy on 06/19/2020.  This consists of dose dense doxorubicin and cyclophosphamide given on day 1 of each 14 day cycle with growth factor support at home (injectable Ziextenzo) x 4 cycles. Today is cycle 4 day 1 of therapy.   Her neutrophil nadirs have been excellent Results for TEVIS, CONGER (MRN [id number]) as of 08/05/2020 08:00  Ref. Range 06/04/2020 08:30 06/19/2020 09:15 06/27/2020 11:40 07/08/2020 09:55 07/22/2020 11:36  NEUT# Latest Ref Range: 1.7 - 7.7 K/uL 2.6 9.1 (H) 2.2 1.6 (L) 3.9    REVIEW OF SYSTEMS: Truth phone does yesterday to tell us he was having neck and shoulder pain radiating below her chest, like her "head was too heavy for her neck ".  We called in lorazepam for her and that worked quite well.  She slept much better and this morning the problem has not completely resolved but pretty much so.  She does have a little bit of tightness and the nuchal area which is longstanding with her.  She feels tired.  She has some discomfort in the left breast area at times.  She  drinks a lot of water during the day and she finds that helpful.  She requests a letter with her stage and prognosis so she can apply to the [employer].  Detailed review of systems today was otherwise stable.  HISTORY OF CURRENT ILLNESS: From the original intake note:  Jeanne Evans herself palpated a painful left breast lump. She underwent bilateral diagnostic mammography with tomography and left breast ultrasonography at [facility] on 05/12/2020 showing: breast density category B; palpable 2.7 cm oval hypoechoic mass in left breast at 2-3 o'clock; 2.2 cm left axillary lymph node/grouping of nodes.  Accordingly on 05/29/2020 she proceeded to biopsy of the left breast area in question. The pathology from this procedure ([id number]) showed: invasive ductal carcinoma, grade 3. Prognostic indicators significant for: estrogen receptor, 10% positive with weak staining intensity and progesterone receptor, 0% negative. Proliferation marker Ki67 at 95%. HER2 equivocal by immunohistochemistry (2+), but negative by fluorescent in situ hybridization with a signals ratio 1.77 and number per cell 2.65.  Biopsy of the left axillary lymph node in question was negative and concordant  The patient's subsequent history is as detailed below.   PAST MEDICAL HISTORY: Past Medical History:  Diagnosis Date  . Breast cancer ([place])   . Family history of ovarian cancer   . Family history of prostate cancer   . History of asthma    has albuterol, uses PRN  . History of heart murmur in childhood   . History of multiple allergies   History of allergy related headaches   PAST SURGICAL HISTORY: Past Surgical History:  Procedure Laterality Date  . PORTACATH PLACEMENT Right 06/18/2020   Procedure: INSERTION PORT-A-CATH WITH ULTRASOUND GUIDANCE;  Surgeon: Erroll Luna, MD;  Location: [facility];  Service: General;  Laterality: Right;  She reports having a boil lanced from  her perineal area   FAMILY HISTORY: Family History  Problem Relation Age of Onset  . Lung cancer Maternal Uncle        dx late 31s  . Prostate cancer Paternal Uncle        dx late 58s  . Ovarian cancer Paternal Grandmother        dx 95s  . Prostate cancer Paternal Grandfather        dx 22s  . Prostate cancer Paternal Uncle        dx early 11s  The patient's father is 66 and her mother 64, as of 05/2020.  The patient has two brothers and one sister. She reports ovarian cancer in her paternal grandmother, prostate cancer in her paternal grandfather and a paternal uncle, and lung cancer in a maternal uncle.   GYNECOLOGIC HISTORY:  No LMP recorded. (Menstrual status: IUD). Menarche: 34 years old Age at first live birth: 34 years old [place] P 2 LMP current, experiences spotting Contraceptive: Mirena IUD in place HRT n/a  Hysterectomy? no BSO? 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 She describes herself as single. She lives at home with her children, [name] (age 103) and [name] (age 6).     ADVANCED DIRECTIVES: Not in place. She intends to name her mother, Deniqua Perry, as her HCPOA.  [name] lives in [place] and can be reached at [phone].  The patient was given the appropriate documents to complete and notarized at her discretion at the time of her visit 06/04/2020   HEALTH MAINTENANCE: Social History   Tobacco Use  . Smoking status: Never Smoker  . Smokeless tobacco: Never Used  Substance Use Topics  . Alcohol use: Yes    Comment: occas  . Drug use: Yes    Types: Marijuana     Colonoscopy: n/a (age)  PAP: 04/2020  Bone density: n/a (age)   Allergies  Allergen Reactions  . Sulfa Antibiotics Hives    Current Outpatient Medications  Medication Sig Dispense Refill  . dexamethasone (DECADRON) 4 MG tablet Take 2 tablets by mouth daily starting the day  after Carboplatin and Cytoxan x 3 days. Take with food. 30 tablet 1  . docusate sodium (COLACE) 100 MG capsule Take 2 capsules (200 mg total) by mouth daily. 10 capsule 0  . ibuprofen (ADVIL) 800 MG tablet Take 1 tablet (800 mg total) by mouth every 8 (eight) hours as needed. 30 tablet 0  . levonorgestrel (MIRENA) 20 MCG/24HR IUD 1 each by Intrauterine route once.    . lidocaine-prilocaine (EMLA) cream Apply to affected area once 30 g 3  . LORazepam (ATIVAN) 0.5 MG tablet Take 1 tablet (0.5 mg total) by mouth at bedtime as needed (Nausea or vomiting). 20 tablet 0  . omeprazole (PRILOSEC) 40 MG capsule Take 1 capsule (40 mg total) by mouth at bedtime. 7 capsule 0  . pegfilgrastim-bmez (ZIEXTENZO) 6 MG/0.6ML injection Inject 0.6 mLs (6 mg total) into the skin every 14 (fourteen) days. 1 Syringe 3  . prochlorperazine (COMPAZINE) 10 MG tablet Take 1 tablet (10 mg total) by mouth every 6 (six) hours as needed (Nausea or vomiting). 30 tablet 1  .  venlafaxine XR (EFFEXOR-XR) 75 MG 24 hr capsule Take 1 capsule (75 mg total) by mouth daily with breakfast. 30 capsule 6  . zolmitriptan (ZOMIG) 5 MG tablet Take 1 tablet (5 mg total) by mouth as needed for migraine. 10 tablet 2   No current facility-administered medications for this visit.   Facility-Administered Medications Ordered in Other Visits  Medication Dose Route Frequency Provider Last Rate Last Admin  . cyclophosphamide (CYTOXAN) 1,080 mg in sodium chloride 0.9 % 250 mL chemo infusion  600 mg/m2 (Treatment Plan Recorded) Intravenous Once Doriann Zuch, Virgie Dad, MD      . heparin lock flush 100 unit/mL  500 Units Intracatheter Once PRN Farhad Burleson, Virgie Dad, MD      . sodium chloride flush (NS) 0.9 % injection 10 mL  10 mL Intracatheter PRN Latanja Lehenbauer, Virgie Dad, MD        OBJECTIVE: African-American woman in no acute distress Vitals:   08/05/20 1006  BP: 123/89  Pulse: 95  Resp: 18  Temp: (!) 97.3 F (36.3 C)  SpO2: 100%     Body mass index is 27.85  kg/m.   Wt Readings from Last 3 Encounters:  08/05/20 159 lb 11.2 oz (72.4 kg)  07/08/20 159 lb 4.8 oz (72.3 kg)  06/27/20 155 lb (70.3 kg)  ECOG FS:1 - Symptomatic but completely ambulatory  Sclerae unicteric, EOMs intact Wearing a mask No cervical or supraclavicular adenopathy Lungs no rales or rhonchi Heart regular rate and rhythm Abd soft, nontender, positive bowel sounds MSK no focal spinal tenderness, no upper extremity lymphedema Neuro: nonfocal, well oriented, appropriate affect Breasts: The right breast is benign.  I can still palpate a very small mass in the left breast, measuring perhaps a half a centimeter.  There is no skin or nipple involvement.  Both axillae are benign.   LAB RESULTS:  CMP     Component Value Date/Time   NA 136 08/05/2020 0955   K 4.0 08/05/2020 0955   CL 103 08/05/2020 0955   CO2 26 08/05/2020 0955   GLUCOSE 98 08/05/2020 0955   BUN 11 08/05/2020 0955   CREATININE 0.81 08/05/2020 0955   CREATININE 0.92 06/04/2020 0830   CALCIUM 9.3 08/05/2020 0955   PROT 7.5 08/05/2020 0955   ALBUMIN 3.8 08/05/2020 0955   AST 20 08/05/2020 0955   AST 13 (L) 06/04/2020 0830   ALT 34 08/05/2020 0955   ALT 14 06/04/2020 0830   ALKPHOS 71 08/05/2020 0955   BILITOT 0.4 08/05/2020 0955   BILITOT 0.5 06/04/2020 0830   GFRNONAA >60 08/05/2020 0955   GFRNONAA >60 06/04/2020 0830   GFRAA >60 08/05/2020 0955   GFRAA >60 06/04/2020 0830    No results found for: TOTALPROTELP, ALBUMINELP, A1GS, A2GS, BETS, BETA2SER, GAMS, MSPIKE, SPEI  Lab Results  Component Value Date   WBC 5.5 08/05/2020   NEUTROABS 3.3 08/05/2020   HGB 10.9 (L) 08/05/2020   HCT 32.2 (L) 08/05/2020   MCV 86.6 08/05/2020   PLT 239 08/05/2020    No results found for: LABCA2  No components found for: HRCBUL845  No results for input(s): INR in the last 168 hours.  No results found for: LABCA2  No results found for: XMI680  No results found for: HOZ224  No results found for:  MGN003  No results found for: CA2729  No components found for: HGQUANT  No results found for: CEA1 / No results found for: CEA1   No results found for: AFPTUMOR  No results found for: [facility]  No results found for: KPAFRELGTCHN, LAMBDASER, KAPLAMBRATIO (kappa/lambda light chains)  No results found for: HGBA, HGBA2QUANT, HGBFQUANT, HGBSQUAN (Hemoglobinopathy evaluation)   No results found for: LDH  No results found for: IRON, TIBC, IRONPCTSAT (Iron and TIBC)  No results found for: FERRITIN  Urinalysis No results found for: COLORURINE, APPEARANCEUR, LABSPEC, PHURINE, GLUCOSEU, HGBUR, BILIRUBINUR, KETONESUR, PROTEINUR, UROBILINOGEN, NITRITE, LEUKOCYTESUR   STUDIES: No results found.   ELIGIBLE FOR AVAILABLE RESEARCH PROTOCOL: no  ASSESSMENT: 34 y.o. [place] woman status post left breast upper outer quadrant biopsy 05/29/2020 for a clinical T2 N0, stage IIB invasive ductal carcinoma, functionally triple negative, with an MIB-1 of 95%.  (1) genetics testing   (a) No pathogenic variants detected in [facility] Multi-Cancer Panel. The Multi-Cancer Panel offered by [facility] includes sequencing and/or deletion duplication testing of the following 85 genes: AIP, ALK, APC, ATM, AXIN2,BAP1,  BARD1, BLM, BMPR1A, BRCA1, BRCA2, BRIP1, CASR, CDC73, CDH1, CDK4, CDKN1B, CDKN1C, CDKN2A (p14ARF), CDKN2A (p16INK4a), CEBPA, CHEK2, CTNNA1, DICER1, DIS3L2, EGFR (c.2369C>T, p.Thr790Met variant only), EPCAM (Deletion/duplication testing only), FH, FLCN, GATA2, GPC3, GREM1 (Promoter region deletion/duplication testing only), HOXB13 (c.251G>A, p.Gly84Glu), HRAS, KIT, MAX, MEN1, MET, MITF (c.952G>A, p.Glu318Lys variant only), MLH1, MSH2, MSH3, MSH6, MUTYH, NBN, NF1, NF2, NTHL1, PALB2, PDGFRA, PHOX2B, PMS2, POLD1, POLE, POT1, PRKAR1A, PTCH1, PTEN, RAD50, RAD51C, RAD51D, RB1, RECQL4, RET, RNF43, RUNX1, SDHAF2, SDHA (sequence changes only), SDHB, SDHC, SDHD, SMAD4, SMARCA4, SMARCB1, SMARCE1, STK11, SUFU,  TERC, TERT, TMEM127, TP53, TSC1, TSC2, VHL, WRN and WT1. The report date is June 23, 2020.   (2) neoadjuvant chemotherapy will consist of cyclophosphamide and doxorubicin in dose dense fashion x4 starting 06/19/2020, to be followed by paclitaxel and carboplatin weekly x12  (a) Echocardiogram on 06/13/2020 showed an EF of 60-65%  (3) definitive surgery to follow  (4) postmastectomy radiation as appropriate   PLAN: Irisa completes the first part of her neoadjuvant chemotherapy today.  She has done remarkably well.  She gives herself the Neupogen shots at home and has had no difficulty with this.  2 weeks from now on 08/19/2020 she will return and see me.  That will be the first day of her carboplatin/paclitaxel doses and we will discuss further the possible toxicities side effects and complications of those agents at that time.  We discussed the stress she is under and the nuchal discomfort she is experiencing.  We talked about relaxation techniques and the use of hot compresses as well as massage.  She knows to start her Neupogen bio similar shots 2 days from now.  She has done remarkably well with those.  She had some discomfort in her left leg area.  She had normal straight leg raising, normal sensation, and normal dorsiflexion bilaterally by exam today.  That was reassuring.  I have asked her to call us with any problems that may develop before her next visit.  Total encounter time 35 minutes.Sarajane Jews C. Lilianah Buffin, MD 08/05/20 12:45 PM Medical Oncology and Hematology [facility] [place], [place] [address] Tel. [phone]    Fax. [phone]   I, Wilburn Mylar, am acting as scribe for Dr. Virgie Dad. Averyana Pillars.  I, Lurline Del MD, have reviewed the above documentation for accuracy and completeness, and I agree with the above.   *Total Encounter Time as defined by the Centers for Medicare and Medicaid Services includes, in addition to  the face-to-face time of a patient visit (documented in the note above) non-face-to-face time: obtaining and reviewing outside history, ordering and reviewing  medications, tests or procedures, care coordination (communications with other health care professionals or caregivers) and documentation in the medical record.

## 2020-08-05 ENCOUNTER — Inpatient Hospital Stay: Payer: 59

## 2020-08-05 ENCOUNTER — Inpatient Hospital Stay (HOSPITAL_BASED_OUTPATIENT_CLINIC_OR_DEPARTMENT_OTHER): Payer: 59 | Admitting: Oncology

## 2020-08-05 ENCOUNTER — Other Ambulatory Visit: Payer: Self-pay

## 2020-08-05 ENCOUNTER — Encounter: Payer: Self-pay | Admitting: *Deleted

## 2020-08-05 VITALS — BP 123/89 | HR 95 | Temp 97.3°F | Resp 18 | Ht 63.5 in | Wt 159.7 lb

## 2020-08-05 DIAGNOSIS — Z170 Estrogen receptor positive status [ER+]: Secondary | ICD-10-CM

## 2020-08-05 DIAGNOSIS — C50412 Malignant neoplasm of upper-outer quadrant of left female breast: Secondary | ICD-10-CM

## 2020-08-05 DIAGNOSIS — Z5111 Encounter for antineoplastic chemotherapy: Secondary | ICD-10-CM | POA: Diagnosis not present

## 2020-08-05 DIAGNOSIS — Z95828 Presence of other vascular implants and grafts: Secondary | ICD-10-CM

## 2020-08-05 LAB — CBC WITH DIFFERENTIAL/PLATELET
Abs Immature Granulocytes: 0.08 K/uL — ABNORMAL HIGH (ref 0.00–0.07)
Basophils Absolute: 0 K/uL (ref 0.0–0.1)
Basophils Relative: 1 %
Eosinophils Absolute: 0 K/uL (ref 0.0–0.5)
Eosinophils Relative: 0 %
HCT: 32.2 % — ABNORMAL LOW (ref 36.0–46.0)
Hemoglobin: 10.9 g/dL — ABNORMAL LOW (ref 12.0–15.0)
Immature Granulocytes: 1 %
Lymphocytes Relative: 25 %
Lymphs Abs: 1.4 K/uL (ref 0.7–4.0)
MCH: 29.3 pg (ref 26.0–34.0)
MCHC: 33.9 g/dL (ref 30.0–36.0)
MCV: 86.6 fL (ref 80.0–100.0)
Monocytes Absolute: 0.8 K/uL (ref 0.1–1.0)
Monocytes Relative: 14 %
Neutro Abs: 3.3 K/uL (ref 1.7–7.7)
Neutrophils Relative %: 59 %
Platelets: 239 K/uL (ref 150–400)
RBC: 3.72 MIL/uL — ABNORMAL LOW (ref 3.87–5.11)
RDW: 16 % — ABNORMAL HIGH (ref 11.5–15.5)
WBC: 5.5 K/uL (ref 4.0–10.5)
nRBC: 0.4 % — ABNORMAL HIGH (ref 0.0–0.2)

## 2020-08-05 LAB — COMPREHENSIVE METABOLIC PANEL
ALT: 34 U/L (ref 0–44)
AST: 20 U/L (ref 15–41)
Albumin: 3.8 g/dL (ref 3.5–5.0)
Alkaline Phosphatase: 71 U/L (ref 38–126)
Anion gap: 7 (ref 5–15)
BUN: 11 mg/dL (ref 6–20)
CO2: 26 mmol/L (ref 22–32)
Calcium: 9.3 mg/dL (ref 8.9–10.3)
Chloride: 103 mmol/L (ref 98–111)
Creatinine, Ser: 0.81 mg/dL (ref 0.44–1.00)
GFR calc Af Amer: >60 mL/min (ref >60)
GFR calc non Af Amer: >60 mL/min (ref >60)
Glucose, Bld: 98 mg/dL (ref 70–99)
Potassium: 4 mmol/L (ref 3.5–5.1)
Sodium: 136 mmol/L (ref 135–145)
Total Bilirubin: 0.4 mg/dL (ref 0.3–1.2)
Total Protein: 7.5 g/dL (ref 6.5–8.1)

## 2020-08-05 MED ORDER — SODIUM CHLORIDE 0.9 % IV SOLN
16.0000 mg | Freq: Once | INTRAVENOUS | Status: AC
  Administered 2020-08-05: 16 mg via INTRAVENOUS
  Filled 2020-08-05: qty 8

## 2020-08-05 MED ORDER — SODIUM CHLORIDE 0.9 % IV SOLN
10.0000 mg | Freq: Once | INTRAVENOUS | Status: AC
  Administered 2020-08-05: 10 mg via INTRAVENOUS
  Filled 2020-08-05: qty 10

## 2020-08-05 MED ORDER — HEPARIN SOD (PORK) LOCK FLUSH 100 UNIT/ML IV SOLN
500.0000 [IU] | Freq: Once | INTRAVENOUS | Status: AC | PRN
  Administered 2020-08-05: 500 [IU]
  Filled 2020-08-05: qty 5

## 2020-08-05 MED ORDER — SODIUM CHLORIDE 0.9 % IV SOLN
Freq: Once | INTRAVENOUS | Status: AC
  Filled 2020-08-05: qty 250

## 2020-08-05 MED ORDER — SODIUM CHLORIDE 0.9 % IV SOLN
600.0000 mg/m2 | Freq: Once | INTRAVENOUS | Status: AC
  Administered 2020-08-05: 1080 mg via INTRAVENOUS
  Filled 2020-08-05: qty 54

## 2020-08-05 MED ORDER — SODIUM CHLORIDE 0.9 % IV SOLN
150.0000 mg | Freq: Once | INTRAVENOUS | Status: AC
  Administered 2020-08-05: 150 mg via INTRAVENOUS
  Filled 2020-08-05: qty 150

## 2020-08-05 MED ORDER — DOXORUBICIN HCL CHEMO IV INJECTION 2 MG/ML
60.0000 mg/m2 | Freq: Once | INTRAVENOUS | Status: AC
  Administered 2020-08-05: 108 mg via INTRAVENOUS
  Filled 2020-08-05: qty 54

## 2020-08-05 MED ORDER — SODIUM CHLORIDE 0.9% FLUSH
10.0000 mL | INTRAVENOUS | Status: DC | PRN
  Administered 2020-08-05: 10 mL
  Filled 2020-08-05: qty 10

## 2020-08-05 MED ORDER — SODIUM CHLORIDE 0.9% FLUSH
10.0000 mL | INTRAVENOUS | Status: DC | PRN
  Administered 2020-08-05: 10 mL via INTRAVENOUS
  Filled 2020-08-05: qty 10

## 2020-08-05 NOTE — Patient Instructions (Signed)
[facility] Discharge Instructions for Patients Receiving Chemotherapy  Today you received the following chemotherapy agents: doxorubicin and cyclophosphamide.  To help prevent nausea and vomiting after your treatment, we encourage you to take your nausea medication as directed.   If you develop nausea and vomiting that is not controlled by your nausea medication, call the clinic.   BELOW ARE SYMPTOMS THAT SHOULD BE REPORTED IMMEDIATELY:  *FEVER GREATER THAN 100.5 F  *CHILLS WITH OR WITHOUT FEVER  NAUSEA AND VOMITING THAT IS NOT CONTROLLED WITH YOUR NAUSEA MEDICATION  *UNUSUAL SHORTNESS OF BREATH  *UNUSUAL BRUISING OR BLEEDING  TENDERNESS IN MOUTH AND THROAT WITH OR WITHOUT PRESENCE OF ULCERS  *URINARY PROBLEMS  *BOWEL PROBLEMS  UNUSUAL RASH Items with * indicate a potential emergency and should be followed up as soon as possible.  Feel free to call the clinic should you have any questions or concerns. The clinic phone number is [phone].  Please show the CHEMO ALERT CARD at check-in to the Emergency Department and triage nurse.   

## 2020-08-05 NOTE — Patient Instructions (Signed)

## 2020-08-06 ENCOUNTER — Other Ambulatory Visit: Payer: Self-pay | Admitting: Oncology

## 2020-08-06 ENCOUNTER — Telehealth: Payer: Self-pay | Admitting: Oncology

## 2020-08-06 DIAGNOSIS — Z170 Estrogen receptor positive status [ER+]: Secondary | ICD-10-CM

## 2020-08-06 DIAGNOSIS — C50412 Malignant neoplasm of upper-outer quadrant of left female breast: Secondary | ICD-10-CM

## 2020-08-06 NOTE — Telephone Encounter (Signed)
No 9/21 los. No changes made to pt's schedule.

## 2020-08-13 ENCOUNTER — Encounter: Payer: Self-pay | Admitting: *Deleted

## 2020-08-13 ENCOUNTER — Telehealth: Payer: Self-pay | Admitting: *Deleted

## 2020-08-13 MED ORDER — FLUCONAZOLE 100 MG PO TABS: 100.0000 mg | ORAL_TABLET | Freq: Every day | ORAL | 0 refills | Status: DC

## 2020-08-13 MED ORDER — MAGIC MOUTHWASH: 5.0000 mL | Freq: Four times a day (QID) | ORAL | 1 refills | Status: DC | PRN

## 2020-08-13 NOTE — Telephone Encounter (Signed)
This RN provided a letter for pt to use for applying for grants and resources per her breast cancer diagnosis.  Letter put in the mail to pt.

## 2020-08-13 NOTE — Telephone Encounter (Signed)
This RN spoke with pt per her call stating onset upon waking of white patches in mouth, tongue and gums.  She states tongue " kind of hurts "  This RN discussed above - caused by chemo and form of thrush.  Discussed treatment of use of diflucan for the thrush as well as use of MMW for comfort.  Jeanne Evans verbalized understanding.  Prescriptions sent to verified pharmacy.

## 2020-08-18 NOTE — Progress Notes (Signed)
[place]  [phone] [phone]     ID: Jeanne Evans DOB: Apr 28, 1986  MR#: [id number]  PHX#:[id number]  Patient Care Team: [name], MD as PCP - General (Obstetrics and Gynecology) Rockwell Germany, RN as Oncology Nurse Navigator Mauro Kaufmann, RN as Oncology Nurse Navigator Erroll Luna, MD as Consulting Physician (General Surgery) Brittain Smithey, Virgie Dad, MD as Consulting Physician (Oncology) Kyung Rudd, MD as Consulting Physician (Radiation Oncology) Servando Salina, MD as Consulting Physician (Obstetrics and Gynecology) Chauncey Cruel, MD OTHER MD:  CHIEF COMPLAINT: 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  She is accompanied by a family member  She completed the first part of her chemotherapy, consisting of doxorubicin and cyclophosphamide, on 08/05/2020. She is now ready to begin weekly carboplatin/paclitaxel.  Her neutrophil nadirs have been excellent Results for Jeanne Evans, Jeanne Evans (MRN [id number]) as of 08/05/2020 08:00  Ref. Range 06/04/2020 08:30 06/19/2020 09:15 06/27/2020 11:40 07/08/2020 09:55 07/22/2020 11:36  NEUT# Latest Ref Range: 1.7 - 7.7 K/uL 2.6 9.1 (H) 2.2 1.6 (L) 3.9    REVIEW OF SYSTEMS: Jeanne Evans does not feel well today.  She has no energy.  She cannot eat.  She had mild problems which are better but not resolved.  She has bad taste perversion.  Her right hand feels numb and tingly at times.  She was a little bit slow in the bowel movements although not exactly constipated.  She has had no intercurrent fever, no nausea or vomiting, and port has worked well.  She is not exercising regularly.  A detailed review of systems today was otherwise stable.   HISTORY OF CURRENT ILLNESS: From the original intake note:  Jeanne Evans herself  palpated a painful left breast lump. She underwent bilateral diagnostic mammography with tomography and left breast ultrasonography at [facility] on 05/12/2020 showing: breast density category B; palpable 2.7 cm oval hypoechoic mass in left breast at 2-3 o'clock; 2.2 cm left axillary lymph node/grouping of nodes.  Accordingly on 05/29/2020 she proceeded to biopsy of the left breast area in question. The pathology from this procedure ([id number]) showed: invasive ductal carcinoma, grade 3. Prognostic indicators significant for: estrogen receptor, 10% positive with weak staining intensity and progesterone receptor, 0% negative. Proliferation marker Ki67 at 95%. HER2 equivocal by immunohistochemistry (2+), but negative by fluorescent in situ hybridization with a signals ratio 1.77 and number per cell 2.65.  Biopsy of the left axillary lymph node in question was negative and concordant  The patient's subsequent history is as detailed below.   PAST MEDICAL HISTORY: Past Medical History:  Diagnosis Date  . Breast cancer ([place])   . Family history of ovarian cancer   . Family history of prostate cancer   . History of asthma    has albuterol, uses PRN  . History of heart murmur in childhood   . History of multiple allergies   History of allergy related headaches   PAST SURGICAL HISTORY: Past Surgical History:  Procedure Laterality Date  . PORTACATH PLACEMENT Right 06/18/2020   Procedure: INSERTION PORT-A-CATH WITH ULTRASOUND GUIDANCE;  Surgeon: Erroll Luna, MD;  Location: [place];  Service: General;  Laterality: Right;  She reports having a boil lanced from her perineal area   FAMILY HISTORY: Family History  Problem Relation Age of Onset  . Lung cancer Maternal Uncle  dx late 63s  . Prostate cancer Paternal Uncle        dx late 57s  . Ovarian cancer Paternal Grandmother        dx 6s  . Prostate cancer Paternal Grandfather        dx 77s  . Prostate  cancer Paternal Uncle        dx early 2s  The patient's father is 95 and her mother 54, as of 05/2020.  The patient has two brothers and one sister. She reports ovarian cancer in her paternal grandmother, prostate cancer in her paternal grandfather and a paternal uncle, and lung cancer in a maternal uncle.   GYNECOLOGIC HISTORY:  No LMP recorded. (Menstrual status: IUD). Menarche: 34 years old Age at first live birth: 34 years old [place] P 2 LMP current, experiences spotting Contraceptive: Mirena IUD in place HRT n/a  Hysterectomy? no BSO? 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 She describes herself as single. She lives at home with her children, Jeanne Evans (age 51) and Jeanne Evans (age 27).     ADVANCED DIRECTIVES: Not in place. She intends to name her mother, Jeanne Evans, as her HCPOA.  Jeanne Evans lives in [place] and can be reached at [phone].  The patient was given the appropriate documents to complete and notarized at her discretion at the time of her visit 06/04/2020   HEALTH MAINTENANCE: Social History   Tobacco Use  . Smoking status: Never Smoker  . Smokeless tobacco: Never Used  Substance Use Topics  . Alcohol use: Yes    Comment: occas  . Drug use: Yes    Types: Marijuana     Colonoscopy: n/a (age)  PAP: 04/2020  Bone density: n/a (age)   Allergies  Allergen Reactions  . Sulfa Antibiotics Hives    Current Outpatient Medications  Medication Sig Dispense Refill  . dexamethasone (DECADRON) 4 MG tablet Take 2 tablets by mouth daily starting the day after Carboplatin and Cytoxan x 3 days. Take with food. 30 tablet 1  . docusate sodium (COLACE) 100 MG capsule Take 2 capsules (200 mg total) by mouth daily. 10 capsule 0  . fluconazole (DIFLUCAN) 100 MG tablet Take 1 tablet (100 mg total) by mouth daily. 7 tablet 0  . ibuprofen (ADVIL) 800 MG tablet Take 1  tablet (800 mg total) by mouth every 8 (eight) hours as needed. 30 tablet 0  . levonorgestrel (MIRENA) 20 MCG/24HR IUD 1 each by Intrauterine route once.    . lidocaine-prilocaine (EMLA) cream Apply to affected area once 30 g 3  . LORazepam (ATIVAN) 0.5 MG tablet Take 1 tablet (0.5 mg total) by mouth at bedtime as needed (Nausea or vomiting). 20 tablet 0  . magic mouthwash SOLN Take 5 mLs by mouth 4 (four) times daily as needed for mouth pain. 240 mL 1  . omeprazole (PRILOSEC) 40 MG capsule Take 1 capsule (40 mg total) by mouth at bedtime. 7 capsule 0  . pegfilgrastim-bmez (ZIEXTENZO) 6 MG/0.6ML injection Inject 0.6 mLs (6 mg total) into the skin every 14 (fourteen) days. 1 Syringe 3  . prochlorperazine (COMPAZINE) 10 MG tablet Take 1 tablet (10 mg total) by mouth every 6 (six) hours as needed (Nausea or vomiting). 30 tablet 1  . venlafaxine XR (EFFEXOR-XR) 75 MG 24 hr capsule Take 1 capsule (75 mg total) by mouth daily with breakfast. 30 capsule 6  . zolmitriptan (ZOMIG) 5  MG tablet Take 1 tablet (5 mg total) by mouth as needed for migraine. 10 tablet 2   No current facility-administered medications for this visit.    OBJECTIVE: African-American woman who appears stated age 34:   08/19/20 1019  BP: (!) 113/57  Pulse: 93  Resp: 18  Temp: (!) 97 F (36.1 C)  SpO2: 99%     Body mass index is 28.07 kg/m.   Wt Readings from Last 3 Encounters:  08/19/20 161 lb (73 kg)  08/05/20 159 lb 11.2 oz (72.4 kg)  07/08/20 159 lb 4.8 oz (72.3 kg)  ECOG FS:1 - Symptomatic but completely ambulatory  Sclerae unicteric, EOMs intact Wearing a mask No cervical or supraclavicular adenopathy Lungs no rales or rhonchi Heart regular rate and rhythm Abd soft, nontender, positive bowel sounds MSK no focal spinal tenderness, no upper extremity lymphedema Neuro: nonfocal, well oriented, appropriate affect Breasts: I no longer palpate a mass in the left breast.  The right breast is benign.  Both axillae  are benign.   LAB RESULTS:  CMP     Component Value Date/Time   NA 134 (L) 08/19/2020 1003   K 3.9 08/19/2020 1003   CL 101 08/19/2020 1003   CO2 28 08/19/2020 1003   GLUCOSE 97 08/19/2020 1003   BUN 7 08/19/2020 1003   CREATININE 0.88 08/19/2020 1003   CREATININE 0.92 06/04/2020 0830   CALCIUM 9.1 08/19/2020 1003   PROT 7.4 08/19/2020 1003   ALBUMIN 3.6 08/19/2020 1003   AST 29 08/19/2020 1003   AST 13 (L) 06/04/2020 0830   ALT 37 08/19/2020 1003   ALT 14 06/04/2020 0830   ALKPHOS 75 08/19/2020 1003   BILITOT 0.4 08/19/2020 1003   BILITOT 0.5 06/04/2020 0830   GFRNONAA >60 08/19/2020 1003   GFRNONAA >60 06/04/2020 0830   GFRAA >60 08/05/2020 0955   GFRAA >60 06/04/2020 0830    No results found for: TOTALPROTELP, ALBUMINELP, A1GS, A2GS, BETS, BETA2SER, GAMS, MSPIKE, SPEI  Lab Results  Component Value Date   WBC 4.2 08/19/2020   NEUTROABS 2.4 08/19/2020   HGB 10.7 (L) 08/19/2020   HCT 31.9 (L) 08/19/2020   MCV 87.9 08/19/2020   PLT 248 08/19/2020    No results found for: LABCA2  No components found for: GNFAOZ308  No results for input(s): INR in the last 168 hours.  No results found for: LABCA2  No results found for: MVH846  No results found for: NGE952  No results found for: WUX324  No results found for: CA2729  No components found for: HGQUANT  No results found for: CEA1 / No results found for: CEA1   No results found for: AFPTUMOR  No results found for: CHROMOGRNA  No results found for: KPAFRELGTCHN, LAMBDASER, KAPLAMBRATIO (kappa/lambda light chains)  No results found for: HGBA, HGBA2QUANT, HGBFQUANT, HGBSQUAN (Hemoglobinopathy evaluation)   No results found for: LDH  No results found for: IRON, TIBC, IRONPCTSAT (Iron and TIBC)  No results found for: FERRITIN  Urinalysis No results found for: COLORURINE, APPEARANCEUR, LABSPEC, PHURINE, GLUCOSEU, HGBUR, BILIRUBINUR, KETONESUR, PROTEINUR, UROBILINOGEN, NITRITE,  LEUKOCYTESUR   STUDIES: No results found.   ELIGIBLE FOR AVAILABLE RESEARCH PROTOCOL: no  ASSESSMENT: 34 y.o. [place] woman status post left breast upper outer quadrant biopsy 05/29/2020 for a clinical T2 N0, stage IIB invasive ductal carcinoma, functionally triple negative, with an MIB-1 of 95%.  (1) genetics testing   (a) No pathogenic variants detected in [facility] Multi-Cancer Panel. The Multi-Cancer Panel offered by [facility] includes sequencing and/or  deletion duplication testing of the following 85 genes: AIP, ALK, APC, ATM, AXIN2,BAP1,  BARD1, BLM, BMPR1A, BRCA1, BRCA2, BRIP1, CASR, CDC73, CDH1, CDK4, CDKN1B, CDKN1C, CDKN2A (p14ARF), CDKN2A (p16INK4a), CEBPA, CHEK2, CTNNA1, DICER1, DIS3L2, EGFR (c.2369C>T, p.Thr790Met variant only), EPCAM (Deletion/duplication testing only), FH, FLCN, GATA2, GPC3, GREM1 (Promoter region deletion/duplication testing only), HOXB13 (c.251G>A, p.Gly84Glu), HRAS, KIT, MAX, MEN1, MET, MITF (c.952G>A, p.Glu318Lys variant only), MLH1, MSH2, MSH3, MSH6, MUTYH, NBN, NF1, NF2, NTHL1, PALB2, PDGFRA, PHOX2B, PMS2, POLD1, POLE, POT1, PRKAR1A, PTCH1, PTEN, RAD50, RAD51C, RAD51D, RB1, RECQL4, RET, RNF43, RUNX1, SDHAF2, SDHA (sequence changes only), SDHB, SDHC, SDHD, SMAD4, SMARCA4, SMARCB1, SMARCE1, STK11, SUFU, TERC, TERT, TMEM127, TP53, TSC1, TSC2, VHL, WRN and WT1. The report date is June 23, 2020.   (2) neoadjuvant chemotherapy consisting of cyclophosphamide and doxorubicin in dose dense fashion x4 started 06/19/2020, completed 08/05/2020 to be followed by paclitaxel and carboplatin weekly x12 starting 08/26/2020  (a) Echocardiogram on 06/13/2020 showed an EF of 60-65%  (3) definitive surgery to follow  (4) postmastectomy radiation as appropriate   PLAN: Jeanne Evans was scheduled to begin the second part of her chemotherapy, namely weekly carboplatin and paclitaxel, today.  However she is not up to it.  She just has no energy, feels terrible, and does not think she  can proceed to treatment.  I think waiting a week will not make a significant difference to her overall response and incidentally I no longer palpate a mass in her left breast which is already very favorable.  She will receive fluids today since she has had trouble hydrating.  She will return in 1 week and that will be the first day of her 12 planned weekly carboplatin/paclitaxel doses.  I have encouraged her to be as active as possible between now and next week and I will see her again at that time  Total encounter time 25 minutes.Sarajane Jews C. Artie Mcintyre, MD 08/19/20 10:41 AM Medical Oncology and Hematology [facility] [place], [place] [address] Tel. [phone]    Fax. [phone]   I, Wilburn Mylar, am acting as scribe for Dr. Virgie Dad. Calandria Mullings.  I, Lurline Del MD, have reviewed the above documentation for accuracy and completeness, and I agree with the above.   *Total Encounter Time as defined by the Centers for Medicare and Medicaid Services includes, in addition to the face-to-face time of a patient visit (documented in the note above) non-face-to-face time: obtaining and reviewing outside history, ordering and reviewing medications, tests or procedures, care coordination (communications with other health care professionals or caregivers) and documentation in the medical record.

## 2020-08-19 ENCOUNTER — Inpatient Hospital Stay: Payer: 59

## 2020-08-19 ENCOUNTER — Inpatient Hospital Stay: Payer: 59 | Attending: Oncology

## 2020-08-19 ENCOUNTER — Other Ambulatory Visit: Payer: Self-pay

## 2020-08-19 ENCOUNTER — Inpatient Hospital Stay (HOSPITAL_BASED_OUTPATIENT_CLINIC_OR_DEPARTMENT_OTHER): Payer: 59 | Admitting: Oncology

## 2020-08-19 VITALS — BP 113/57 | HR 93 | Temp 97.0°F | Resp 18 | Ht 63.5 in | Wt 161.0 lb

## 2020-08-19 DIAGNOSIS — Z975 Presence of (intrauterine) contraceptive device: Secondary | ICD-10-CM | POA: Diagnosis not present

## 2020-08-19 DIAGNOSIS — Z8041 Family history of malignant neoplasm of ovary: Secondary | ICD-10-CM | POA: Insufficient documentation

## 2020-08-19 DIAGNOSIS — Z171 Estrogen receptor negative status [ER-]: Secondary | ICD-10-CM | POA: Insufficient documentation

## 2020-08-19 DIAGNOSIS — J45909 Unspecified asthma, uncomplicated: Secondary | ICD-10-CM | POA: Diagnosis not present

## 2020-08-19 DIAGNOSIS — C50412 Malignant neoplasm of upper-outer quadrant of left female breast: Secondary | ICD-10-CM | POA: Diagnosis present

## 2020-08-19 DIAGNOSIS — Z79899 Other long term (current) drug therapy: Secondary | ICD-10-CM | POA: Diagnosis not present

## 2020-08-19 DIAGNOSIS — Z8042 Family history of malignant neoplasm of prostate: Secondary | ICD-10-CM | POA: Insufficient documentation

## 2020-08-19 DIAGNOSIS — Z793 Long term (current) use of hormonal contraceptives: Secondary | ICD-10-CM | POA: Diagnosis not present

## 2020-08-19 DIAGNOSIS — Z5111 Encounter for antineoplastic chemotherapy: Secondary | ICD-10-CM | POA: Insufficient documentation

## 2020-08-19 DIAGNOSIS — Z170 Estrogen receptor positive status [ER+]: Secondary | ICD-10-CM

## 2020-08-19 DIAGNOSIS — Z801 Family history of malignant neoplasm of trachea, bronchus and lung: Secondary | ICD-10-CM | POA: Insufficient documentation

## 2020-08-19 DIAGNOSIS — E860 Dehydration: Secondary | ICD-10-CM | POA: Insufficient documentation

## 2020-08-19 DIAGNOSIS — Z95828 Presence of other vascular implants and grafts: Secondary | ICD-10-CM

## 2020-08-19 LAB — CBC WITH DIFFERENTIAL/PLATELET
Abs Immature Granulocytes: 0.03 10*3/uL (ref 0.00–0.07)
Basophils Absolute: 0 10*3/uL (ref 0.0–0.1)
Basophils Relative: 0 %
Eosinophils Absolute: 0 10*3/uL (ref 0.0–0.5)
Eosinophils Relative: 0 %
HCT: 31.9 % — ABNORMAL LOW (ref 36.0–46.0)
Hemoglobin: 10.7 g/dL — ABNORMAL LOW (ref 12.0–15.0)
Immature Granulocytes: 1 %
Lymphocytes Relative: 22 %
Lymphs Abs: 0.9 10*3/uL (ref 0.7–4.0)
MCH: 29.5 pg (ref 26.0–34.0)
MCHC: 33.5 g/dL (ref 30.0–36.0)
MCV: 87.9 fL (ref 80.0–100.0)
Monocytes Absolute: 0.8 10*3/uL (ref 0.1–1.0)
Monocytes Relative: 20 %
Neutro Abs: 2.4 10*3/uL (ref 1.7–7.7)
Neutrophils Relative %: 57 %
Platelets: 248 10*3/uL (ref 150–400)
RBC: 3.63 MIL/uL — ABNORMAL LOW (ref 3.87–5.11)
RDW: 16.2 % — ABNORMAL HIGH (ref 11.5–15.5)
WBC: 4.2 10*3/uL (ref 4.0–10.5)
nRBC: 0.5 % — ABNORMAL HIGH (ref 0.0–0.2)

## 2020-08-19 LAB — COMPREHENSIVE METABOLIC PANEL
ALT: 37 U/L (ref 0–44)
AST: 29 U/L (ref 15–41)
Albumin: 3.6 g/dL (ref 3.5–5.0)
Alkaline Phosphatase: 75 U/L (ref 38–126)
Anion gap: 5 (ref 5–15)
BUN: 7 mg/dL (ref 6–20)
CO2: 28 mmol/L (ref 22–32)
Calcium: 9.1 mg/dL (ref 8.9–10.3)
Chloride: 101 mmol/L (ref 98–111)
Creatinine, Ser: 0.88 mg/dL (ref 0.44–1.00)
GFR calc non Af Amer: >60 mL/min (ref >60)
Glucose, Bld: 97 mg/dL (ref 70–99)
Potassium: 3.9 mmol/L (ref 3.5–5.1)
Sodium: 134 mmol/L — ABNORMAL LOW (ref 135–145)
Total Bilirubin: 0.4 mg/dL (ref 0.3–1.2)
Total Protein: 7.4 g/dL (ref 6.5–8.1)

## 2020-08-19 MED ORDER — SODIUM CHLORIDE 0.9 % IV SOLN
Freq: Once | INTRAVENOUS | Status: AC
  Filled 2020-08-19: qty 250

## 2020-08-19 MED ORDER — SODIUM CHLORIDE 0.9% FLUSH
10.0000 mL | INTRAVENOUS | Status: DC | PRN
  Administered 2020-08-19: 10 mL via INTRAVENOUS
  Filled 2020-08-19: qty 10

## 2020-08-19 NOTE — Patient Instructions (Signed)

## 2020-08-19 NOTE — Patient Instructions (Signed)

## 2020-08-26 ENCOUNTER — Inpatient Hospital Stay: Payer: 59

## 2020-08-26 ENCOUNTER — Other Ambulatory Visit: Payer: 59

## 2020-08-26 ENCOUNTER — Other Ambulatory Visit: Payer: Self-pay

## 2020-08-26 ENCOUNTER — Encounter: Payer: Self-pay | Admitting: *Deleted

## 2020-08-26 ENCOUNTER — Inpatient Hospital Stay (HOSPITAL_BASED_OUTPATIENT_CLINIC_OR_DEPARTMENT_OTHER): Payer: 59 | Admitting: Oncology

## 2020-08-26 VITALS — BP 131/86 | HR 84 | Temp 97.4°F | Resp 18 | Ht 63.5 in | Wt 151.0 lb

## 2020-08-26 VITALS — BP 112/61 | HR 88 | Temp 98.2°F | Resp 16

## 2020-08-26 DIAGNOSIS — Z170 Estrogen receptor positive status [ER+]: Secondary | ICD-10-CM

## 2020-08-26 DIAGNOSIS — C50412 Malignant neoplasm of upper-outer quadrant of left female breast: Secondary | ICD-10-CM

## 2020-08-26 DIAGNOSIS — Z95828 Presence of other vascular implants and grafts: Secondary | ICD-10-CM

## 2020-08-26 DIAGNOSIS — Z5111 Encounter for antineoplastic chemotherapy: Secondary | ICD-10-CM | POA: Diagnosis not present

## 2020-08-26 LAB — COMPREHENSIVE METABOLIC PANEL
ALT: 47 U/L — ABNORMAL HIGH (ref 0–44)
AST: 29 U/L (ref 15–41)
Albumin: 3.4 g/dL — ABNORMAL LOW (ref 3.5–5.0)
Alkaline Phosphatase: 69 U/L (ref 38–126)
Anion gap: 5 (ref 5–15)
BUN: 5 mg/dL — ABNORMAL LOW (ref 6–20)
CO2: 25 mmol/L (ref 22–32)
Calcium: 9.2 mg/dL (ref 8.9–10.3)
Chloride: 110 mmol/L (ref 98–111)
Creatinine, Ser: 0.78 mg/dL (ref 0.44–1.00)
GFR, Estimated: >60 mL/min (ref >60)
Glucose, Bld: 101 mg/dL — ABNORMAL HIGH (ref 70–99)
Potassium: 4 mmol/L (ref 3.5–5.1)
Sodium: 140 mmol/L (ref 135–145)
Total Bilirubin: 0.3 mg/dL (ref 0.3–1.2)
Total Protein: 6.8 g/dL (ref 6.5–8.1)

## 2020-08-26 LAB — CBC WITH DIFFERENTIAL/PLATELET
Abs Immature Granulocytes: 0.03 10*3/uL (ref 0.00–0.07)
Basophils Absolute: 0 10*3/uL (ref 0.0–0.1)
Basophils Relative: 1 %
Eosinophils Absolute: 0 10*3/uL (ref 0.0–0.5)
Eosinophils Relative: 0 %
HCT: 28.4 % — ABNORMAL LOW (ref 36.0–46.0)
Hemoglobin: 9.4 g/dL — ABNORMAL LOW (ref 12.0–15.0)
Immature Granulocytes: 1 %
Lymphocytes Relative: 24 %
Lymphs Abs: 1.6 10*3/uL (ref 0.7–4.0)
MCH: 29.7 pg (ref 26.0–34.0)
MCHC: 33.1 g/dL (ref 30.0–36.0)
MCV: 89.9 fL (ref 80.0–100.0)
Monocytes Absolute: 0.8 10*3/uL (ref 0.1–1.0)
Monocytes Relative: 12 %
Neutro Abs: 4.1 10*3/uL (ref 1.7–7.7)
Neutrophils Relative %: 62 %
Platelets: 408 10*3/uL — ABNORMAL HIGH (ref 150–400)
RBC: 3.16 MIL/uL — ABNORMAL LOW (ref 3.87–5.11)
RDW: 16.6 % — ABNORMAL HIGH (ref 11.5–15.5)
WBC: 6.5 10*3/uL (ref 4.0–10.5)
nRBC: 0 % (ref 0.0–0.2)

## 2020-08-26 MED ORDER — FAMOTIDINE IN NACL 20-0.9 MG/50ML-% IV SOLN
INTRAVENOUS | Status: AC
  Filled 2020-08-26: qty 50

## 2020-08-26 MED ORDER — SODIUM CHLORIDE 0.9% FLUSH
10.0000 mL | INTRAVENOUS | Status: DC | PRN
  Administered 2020-08-26: 10 mL
  Filled 2020-08-26: qty 10

## 2020-08-26 MED ORDER — FAMOTIDINE IN NACL 20-0.9 MG/50ML-% IV SOLN
20.0000 mg | Freq: Once | INTRAVENOUS | Status: AC
  Administered 2020-08-26: 20 mg via INTRAVENOUS

## 2020-08-26 MED ORDER — SODIUM CHLORIDE 0.9 % IV SOLN
273.4000 mg | Freq: Once | INTRAVENOUS | Status: AC
  Administered 2020-08-26: 270 mg via INTRAVENOUS
  Filled 2020-08-26: qty 27

## 2020-08-26 MED ORDER — DIPHENHYDRAMINE HCL 50 MG/ML IJ SOLN
INTRAMUSCULAR | Status: AC
  Filled 2020-08-26: qty 1

## 2020-08-26 MED ORDER — SODIUM CHLORIDE 0.9 % IV SOLN
16.0000 mg | Freq: Once | INTRAVENOUS | Status: AC
  Administered 2020-08-26: 16 mg via INTRAVENOUS
  Filled 2020-08-26: qty 8

## 2020-08-26 MED ORDER — SODIUM CHLORIDE 0.9% FLUSH
10.0000 mL | Freq: Once | INTRAVENOUS | Status: AC
  Administered 2020-08-26: 10 mL via INTRAVENOUS
  Filled 2020-08-26: qty 10

## 2020-08-26 MED ORDER — SODIUM CHLORIDE 0.9 % IV SOLN
Freq: Once | INTRAVENOUS | Status: AC
  Filled 2020-08-26: qty 250

## 2020-08-26 MED ORDER — SODIUM CHLORIDE 0.9 % IV SOLN
80.0000 mg/m2 | Freq: Once | INTRAVENOUS | Status: AC
  Administered 2020-08-26: 144 mg via INTRAVENOUS
  Filled 2020-08-26: qty 24

## 2020-08-26 MED ORDER — SODIUM CHLORIDE 0.9 % IV SOLN
20.0000 mg | Freq: Once | INTRAVENOUS | Status: AC
  Administered 2020-08-26: 20 mg via INTRAVENOUS
  Filled 2020-08-26: qty 20

## 2020-08-26 MED ORDER — HEPARIN SOD (PORK) LOCK FLUSH 100 UNIT/ML IV SOLN
500.0000 [IU] | Freq: Once | INTRAVENOUS | Status: AC | PRN
  Administered 2020-08-26: 500 [IU]
  Filled 2020-08-26: qty 5

## 2020-08-26 MED ORDER — DIPHENHYDRAMINE HCL 50 MG/ML IJ SOLN
25.0000 mg | Freq: Once | INTRAMUSCULAR | Status: AC
  Administered 2020-08-26: 25 mg via INTRAVENOUS

## 2020-08-26 NOTE — Patient Instructions (Signed)
[facility] Discharge Instructions for Patients Receiving Chemotherapy  Today you received the following chemotherapy agents: Taxol, carboplatin.  To help prevent nausea and vomiting after your treatment, we encourage you to take your nausea medication as prescribed.   If you develop nausea and vomiting that is not controlled by your nausea medication, call the clinic.   BELOW ARE SYMPTOMS THAT SHOULD BE REPORTED IMMEDIATELY:  *FEVER GREATER THAN 100.5 F  *CHILLS WITH OR WITHOUT FEVER  NAUSEA AND VOMITING THAT IS NOT CONTROLLED WITH YOUR NAUSEA MEDICATION  *UNUSUAL SHORTNESS OF BREATH  *UNUSUAL BRUISING OR BLEEDING  TENDERNESS IN MOUTH AND THROAT WITH OR WITHOUT PRESENCE OF ULCERS  *URINARY PROBLEMS  *BOWEL PROBLEMS  UNUSUAL RASH Items with * indicate a potential emergency and should be followed up as soon as possible.  Feel free to call the clinic should you have any questions or concerns. The clinic phone number is [phone].  Please show the [facility] at check-in to the Emergency Department and triage nurse.  Paclitaxel injection What is this medicine? PACLITAXEL (PAK li TAX el) is a chemotherapy drug. It targets fast dividing cells, like cancer cells, and causes these cells to die. This medicine is used to treat ovarian cancer, breast cancer, lung cancer, Kaposi's sarcoma, and other cancers. This medicine may be used for other purposes; ask your health care provider or pharmacist if you have questions. COMMON BRAND NAME(S): Onxol, Taxol What should I tell my health care provider before I take this medicine? They need to know if you have any of these conditions:  history of irregular heartbeat  liver disease  low blood counts, like low white cell, platelet, or red cell counts  lung or breathing disease, like asthma  tingling of the fingers or toes, or other nerve disorder  an unusual or allergic reaction to paclitaxel, alcohol,  polyoxyethylated castor oil, other chemotherapy, other medicines, foods, dyes, or preservatives  pregnant or trying to get pregnant  breast-feeding How should I use this medicine? This drug is given as an infusion into a vein. It is administered in a hospital or clinic by a specially trained health care professional. Talk to your pediatrician regarding the use of this medicine in children. Special care may be needed. Overdosage: If you think you have taken too much of this medicine contact a poison control center or emergency room at once. NOTE: This medicine is only for you. Do not share this medicine with others. What if I miss a dose? It is important not to miss your dose. Call your doctor or health care professional if you are unable to keep an appointment. What may interact with this medicine? Do not take this medicine with any of the following medications:  disulfiram  metronidazole This medicine may also interact with the following medications:  antiviral medicines for hepatitis, HIV or AIDS  certain antibiotics like erythromycin and clarithromycin  certain medicines for fungal infections like ketoconazole and itraconazole  certain medicines for seizures like carbamazepine, phenobarbital, phenytoin  gemfibrozil  nefazodone  rifampin  St. John's wort This list may not describe all possible interactions. Give your health care provider a list of all the medicines, herbs, non-prescription drugs, or dietary supplements you use. Also tell them if you smoke, drink alcohol, or use illegal drugs. Some items may interact with your medicine. What should I watch for while using this medicine? Your condition will be monitored carefully while you are receiving this medicine. You will need important blood  work done while you are taking this medicine. This medicine can cause serious allergic reactions. To reduce your risk you will need to take other medicine(s) before treatment with this  medicine. If you experience allergic reactions like skin rash, itching or hives, swelling of the face, lips, or tongue, tell your doctor or health care professional right away. In some cases, you may be given additional medicines to help with side effects. Follow all directions for their use. This drug may make you feel generally unwell. This is not uncommon, as chemotherapy can affect healthy cells as well as cancer cells. Report any side effects. Continue your course of treatment even though you feel ill unless your doctor tells you to stop. Call your doctor or health care professional for advice if you get a fever, chills or sore throat, or other symptoms of a cold or flu. Do not treat yourself. This drug decreases your body's ability to fight infections. Try to avoid being around people who are sick. This medicine may increase your risk to bruise or bleed. Call your doctor or health care professional if you notice any unusual bleeding. Be careful brushing and flossing your teeth or using a toothpick because you may get an infection or bleed more easily. If you have any dental work done, tell your dentist you are receiving this medicine. Avoid taking products that contain aspirin, acetaminophen, ibuprofen, naproxen, or ketoprofen unless instructed by your doctor. These medicines may hide a fever. Do not become pregnant while taking this medicine. Women should inform their doctor if they wish to become pregnant or think they might be pregnant. There is a potential for serious side effects to an unborn child. Talk to your health care professional or pharmacist for more information. Do not breast-feed an infant while taking this medicine. Men are advised not to father a child while receiving this medicine. This product may contain alcohol. Ask your pharmacist or healthcare provider if this medicine contains alcohol. Be sure to tell all healthcare providers you are taking this medicine. Certain medicines,  like metronidazole and disulfiram, can cause an unpleasant reaction when taken with alcohol. The reaction includes flushing, headache, nausea, vomiting, sweating, and increased thirst. The reaction can last from 30 minutes to several hours. What side effects may I notice from receiving this medicine? Side effects that you should report to your doctor or health care professional as soon as possible:  allergic reactions like skin rash, itching or hives, swelling of the face, lips, or tongue  breathing problems  changes in vision  fast, irregular heartbeat  high or low blood pressure  mouth sores  pain, tingling, numbness in the hands or feet  signs of decreased platelets or bleeding - bruising, pinpoint red spots on the skin, black, tarry stools, blood in the urine  signs of decreased red blood cells - unusually weak or tired, feeling faint or lightheaded, falls  signs of infection - fever or chills, cough, sore throat, pain or difficulty passing urine  signs and symptoms of liver injury like dark yellow or brown urine; general ill feeling or flu-like symptoms; light-colored stools; loss of appetite; nausea; right upper belly pain; unusually weak or tired; yellowing of the eyes or skin  swelling of the ankles, feet, hands  unusually slow heartbeat Side effects that usually do not require medical attention (report to your doctor or health care professional if they continue or are bothersome):  diarrhea  hair loss  loss of appetite  muscle or joint pain  nausea, vomiting  pain, redness, or irritation at site where injected  tiredness This list may not describe all possible side effects. Call your doctor for medical advice about side effects. You may report side effects to FDA at 1-800-FDA-1088. Where should I keep my medicine? This drug is given in a hospital or clinic and will not be stored at home. NOTE: This sheet is a summary. It may not cover all possible information.  If you have questions about this medicine, talk to your doctor, pharmacist, or health care provider.  2020 [employer] Standard (2017-07-05 13:14:55)   Carboplatin injection What is this medicine? CARBOPLATIN (KAR boe pla tin) is a chemotherapy drug. It targets fast dividing cells, like cancer cells, and causes these cells to die. This medicine is used to treat ovarian cancer and many other cancers. This medicine may be used for other purposes; ask your health care provider or pharmacist if you have questions. COMMON BRAND NAME(S): Paraplatin What should I tell my health care provider before I take this medicine? They need to know if you have any of these conditions:  blood disorders  hearing problems  kidney disease  recent or ongoing radiation therapy  an unusual or allergic reaction to carboplatin, cisplatin, other chemotherapy, other medicines, foods, dyes, or preservatives  pregnant or trying to get pregnant  breast-feeding How should I use this medicine? This drug is usually given as an infusion into a vein. It is administered in a hospital or clinic by a specially trained health care professional. Talk to your pediatrician regarding the use of this medicine in children. Special care may be needed. Overdosage: If you think you have taken too much of this medicine contact a poison control center or emergency room at once. NOTE: This medicine is only for you. Do not share this medicine with others. What if I miss a dose? It is important not to miss a dose. Call your doctor or health care professional if you are unable to keep an appointment. What may interact with this medicine?  medicines for seizures  medicines to increase blood counts like filgrastim, pegfilgrastim, sargramostim  some antibiotics like amikacin, gentamicin, neomycin, streptomycin, tobramycin  vaccines Talk to your doctor or health care professional before taking any of these  medicines:  acetaminophen  aspirin  ibuprofen  ketoprofen  naproxen This list may not describe all possible interactions. Give your health care provider a list of all the medicines, herbs, non-prescription drugs, or dietary supplements you use. Also tell them if you smoke, drink alcohol, or use illegal drugs. Some items may interact with your medicine. What should I watch for while using this medicine? Your condition will be monitored carefully while you are receiving this medicine. You will need important blood work done while you are taking this medicine. This drug may make you feel generally unwell. This is not uncommon, as chemotherapy can affect healthy cells as well as cancer cells. Report any side effects. Continue your course of treatment even though you feel ill unless your doctor tells you to stop. In some cases, you may be given additional medicines to help with side effects. Follow all directions for their use. Call your doctor or health care professional for advice if you get a fever, chills or sore throat, or other symptoms of a cold or flu. Do not treat yourself. This drug decreases your body's ability to fight infections. Try to avoid being around people who are sick. This medicine may increase your risk to bruise or  bleed. Call your doctor or health care professional if you notice any unusual bleeding. Be careful brushing and flossing your teeth or using a toothpick because you may get an infection or bleed more easily. If you have any dental work done, tell your dentist you are receiving this medicine. Avoid taking products that contain aspirin, acetaminophen, ibuprofen, naproxen, or ketoprofen unless instructed by your doctor. These medicines may hide a fever. Do not become pregnant while taking this medicine. Women should inform their doctor if they wish to become pregnant or think they might be pregnant. There is a potential for serious side effects to an unborn child. Talk  to your health care professional or pharmacist for more information. Do not breast-feed an infant while taking this medicine. What side effects may I notice from receiving this medicine? Side effects that you should report to your doctor or health care professional as soon as possible:  allergic reactions like skin rash, itching or hives, swelling of the face, lips, or tongue  signs of infection - fever or chills, cough, sore throat, pain or difficulty passing urine  signs of decreased platelets or bleeding - bruising, pinpoint red spots on the skin, black, tarry stools, nosebleeds  signs of decreased red blood cells - unusually weak or tired, fainting spells, lightheadedness  breathing problems  changes in hearing  changes in vision  chest pain  high blood pressure  low blood counts - This drug may decrease the number of white blood cells, red blood cells and platelets. You may be at increased risk for infections and bleeding.  nausea and vomiting  pain, swelling, redness or irritation at the injection site  pain, tingling, numbness in the hands or feet  problems with balance, talking, walking  trouble passing urine or change in the amount of urine Side effects that usually do not require medical attention (report to your doctor or health care professional if they continue or are bothersome):  hair loss  loss of appetite  metallic taste in the mouth or changes in taste This list may not describe all possible side effects. Call your doctor for medical advice about side effects. You may report side effects to FDA at 1-800-FDA-1088. Where should I keep my medicine? This drug is given in a hospital or clinic and will not be stored at home. NOTE: This sheet is a summary. It may not cover all possible information. If you have questions about this medicine, talk to your doctor, pharmacist, or health care provider.  2020 [employer]/Gold Standard (2008-02-06 14:38:05)  

## 2020-08-26 NOTE — Progress Notes (Signed)
Jeanne Evans  [phone] [phone]     ID: Jeanne Evans DOB: 1986-08-10  MR#: [id number]  ZHY#:[id number]  Patient Care Team: [name], MD as PCP - General (Obstetrics and Gynecology) Rockwell Germany, RN as Oncology Nurse Navigator Mauro Kaufmann, RN as Oncology Nurse Navigator Erroll Luna, MD as Consulting Physician (General Surgery) Damien Batty, Virgie Dad, MD as Consulting Physician (Oncology) Kyung Rudd, MD as Consulting Physician (Radiation Oncology) Servando Salina, MD as Consulting Physician (Obstetrics and Gynecology) Chauncey Cruel, MD OTHER MD:  CHIEF COMPLAINT: 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  Her mother is not with her today  She was supposed to have started weekly carboplatin/paclitaxel at her last visit on 08/19/2020.  However she just did not have any energy and felt terrible so her treatment was postponed.  Today is day 1 cycle 1.    Her neutrophil nadirs have been excellent Lab Results  Component Value Date   NEUTROABS 4.1 08/26/2020   NEUTROABS 2.4 08/19/2020   NEUTROABS 3.3 08/05/2020   NEUTROABS 3.9 07/22/2020   NEUTROABS 1.6 (L) 07/08/2020     REVIEW OF SYSTEMS: Elanie is feeling much better this week.  She is very appreciative that we did wait that extra week to give her time to catch up.  She is been out of bed and active and just feeling more like herself.  She has a little bit of a dry mouth but no mouth sores.  She had a yeast infection which is improving on Diflucan.  A detailed review of systems today was otherwise stable   HISTORY OF CURRENT ILLNESS: From the original intake note:  [name] herself palpated a painful left breast lump. She underwent bilateral diagnostic mammography with tomography and  left breast ultrasonography at [facility] on 05/12/2020 showing: breast density category B; palpable 2.7 cm oval hypoechoic mass in left breast at 2-3 o'clock; 2.2 cm left axillary lymph node/grouping of nodes.  Accordingly on 05/29/2020 she proceeded to biopsy of the left breast area in question. The pathology from this procedure ([id number]) showed: invasive ductal carcinoma, grade 3. Prognostic indicators significant for: estrogen receptor, 10% positive with weak staining intensity and progesterone receptor, 0% negative. Proliferation marker Ki67 at 95%. HER2 equivocal by immunohistochemistry (2+), but negative by fluorescent in situ hybridization with a signals ratio 1.77 and number per cell 2.65.  Biopsy of the left axillary lymph node in question was negative and concordant  The patient's subsequent history is as detailed below.   PAST MEDICAL HISTORY: Past Medical History:  Diagnosis Date  . Breast cancer ([place])   . Family history of ovarian cancer   . Family history of prostate cancer   . History of asthma    has albuterol, uses PRN  . History of heart murmur in childhood   . History of multiple allergies   History of allergy related headaches   PAST SURGICAL HISTORY: Past Surgical History:  Procedure Laterality Date  . PORTACATH PLACEMENT Right 06/18/2020   Procedure: INSERTION PORT-A-CATH WITH ULTRASOUND GUIDANCE;  Surgeon: Erroll Luna, MD;  Location: [facility];  Service: General;  Laterality: Right;  She reports having a boil lanced from her perineal area   FAMILY HISTORY: Family History  Problem Relation Age of Onset  . Lung cancer Maternal Uncle  dx late 69s  . Prostate cancer Paternal Uncle        dx late 32s  . Ovarian cancer Paternal Grandmother        dx 46s  . Prostate cancer Paternal Grandfather        dx 26s  . Prostate cancer Paternal Uncle        dx early 80s  The patient's father is 76 and her mother 20, as of 05/2020.   The patient has two brothers and one sister. She reports ovarian cancer in her paternal grandmother, prostate cancer in her paternal grandfather and a paternal uncle, and lung cancer in a maternal uncle.   GYNECOLOGIC HISTORY:  No LMP recorded. (Menstrual status: IUD). Menarche: 34 years old Age at first live birth: 34 years old Dar[place] P 2 LMP current, experiences spotting Contraceptive: Mirena IUD in place HRT n/a  Hysterectomy? no BSO? 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 She describes herself as single. She lives at home with her children, Jeanne Evans (age 44) and Jeanne Evans (age 58).     ADVANCED DIRECTIVES: Not in place. She intends to name her mother, Jeanne Evans, as her HCPOA.  [name] lives in [place] and can be reached at [phone].  The patient was given the appropriate documents to complete and notarized at her discretion at the time of her visit 06/04/2020   HEALTH MAINTENANCE: Social History   Tobacco Use  . Smoking status: Never Smoker  . Smokeless tobacco: Never Used  Substance Use Topics  . Alcohol use: Yes    Comment: occas  . Drug use: Yes    Types: Marijuana     Colonoscopy: n/a (age)  PAP: 04/2020  Bone density: n/a (age)   Allergies  Allergen Reactions  . Sulfa Antibiotics Hives    Current Outpatient Medications  Medication Sig Dispense Refill  . dexamethasone (DECADRON) 4 MG tablet Take 2 tablets by mouth daily starting the day after Carboplatin and Cytoxan x 3 days. Take with food. 30 tablet 1  . docusate sodium (COLACE) 100 MG capsule Take 2 capsules (200 mg total) by mouth daily. 10 capsule 0  . fluconazole (DIFLUCAN) 100 MG tablet Take 1 tablet (100 mg total) by mouth daily. 7 tablet 0  . ibuprofen (ADVIL) 800 MG tablet Take 1 tablet (800 mg total) by mouth every 8 (eight) hours as needed. 30 tablet 0  . levonorgestrel (MIRENA) 20  MCG/24HR IUD 1 each by Intrauterine route once.    . lidocaine-prilocaine (EMLA) cream Apply to affected area once 30 g 3  . LORazepam (ATIVAN) 0.5 MG tablet Take 1 tablet (0.5 mg total) by mouth at bedtime as needed (Nausea or vomiting). 20 tablet 0  . magic mouthwash SOLN Take 5 mLs by mouth 4 (four) times daily as needed for mouth pain. 240 mL 1  . omeprazole (PRILOSEC) 40 MG capsule Take 1 capsule (40 mg total) by mouth at bedtime. 7 capsule 0  . pegfilgrastim-bmez (ZIEXTENZO) 6 MG/0.6ML injection Inject 0.6 mLs (6 mg total) into the skin every 14 (fourteen) days. 1 Syringe 3  . prochlorperazine (COMPAZINE) 10 MG tablet Take 1 tablet (10 mg total) by mouth every 6 (six) hours as needed (Nausea or vomiting). 30 tablet 1  . venlafaxine XR (EFFEXOR-XR) 75 MG 24 hr capsule Take 1 capsule (75 mg total) by mouth daily with breakfast. 30 capsule 6  . zolmitriptan (ZOMIG) 5  MG tablet Take 1 tablet (5 mg total) by mouth as needed for migraine. 10 tablet 2   No current facility-administered medications for this visit.    OBJECTIVE: African-American woman in no acute distress Vitals:   08/26/20 0947  BP: 131/86  Pulse: 84  Resp: 18  Temp: (!) 97.4 F (36.3 C)  SpO2: 100%     Body mass index is 26.33 kg/m.   Wt Readings from Last 3 Encounters:  08/26/20 151 lb (68.5 kg)  08/19/20 161 lb (73 kg)  08/05/20 159 lb 11.2 oz (72.4 kg)  ECOG FS:1 - Symptomatic but completely ambulatory  Sclerae unicteric, EOMs intact Wearing a mask No cervical or supraclavicular adenopathy Lungs no rales or rhonchi Heart regular rate and rhythm Abd soft, nontender, positive bowel sounds MSK no focal spinal tenderness, no upper extremity lymphedema Neuro: nonfocal, well oriented, appropriate affect Breasts: I no longer palpate a mass in the left breast.  This is very favorable.  Both axillae are benign.   LAB RESULTS:  CMP     Component Value Date/Time   NA 134 (L) 08/19/2020 1003   K 3.9 08/19/2020  1003   CL 101 08/19/2020 1003   CO2 28 08/19/2020 1003   GLUCOSE 97 08/19/2020 1003   BUN 7 08/19/2020 1003   CREATININE 0.88 08/19/2020 1003   CREATININE 0.92 06/04/2020 0830   CALCIUM 9.1 08/19/2020 1003   PROT 7.4 08/19/2020 1003   ALBUMIN 3.6 08/19/2020 1003   AST 29 08/19/2020 1003   AST 13 (L) 06/04/2020 0830   ALT 37 08/19/2020 1003   ALT 14 06/04/2020 0830   ALKPHOS 75 08/19/2020 1003   BILITOT 0.4 08/19/2020 1003   BILITOT 0.5 06/04/2020 0830   GFRNONAA >60 08/19/2020 1003   GFRNONAA >60 06/04/2020 0830   GFRAA >60 08/05/2020 0955   GFRAA >60 06/04/2020 0830    No results found for: TOTALPROTELP, ALBUMINELP, A1GS, A2GS, BETS, BETA2SER, GAMS, MSPIKE, SPEI  Lab Results  Component Value Date   WBC 6.5 08/26/2020   NEUTROABS 4.1 08/26/2020   HGB 9.4 (L) 08/26/2020   HCT 28.4 (L) 08/26/2020   MCV 89.9 08/26/2020   PLT 408 (H) 08/26/2020    No results found for: LABCA2  No components found for: EEFEOF121  No results for input(s): INR in the last 168 hours.  No results found for: LABCA2  No results found for: FXJ883  No results found for: GPQ982  No results found for: MEB583  No results found for: CA2729  No components found for: HGQUANT  No results found for: CEA1 / No results found for: CEA1   No results found for: AFPTUMOR  No results found for: CHROMOGRNA  No results found for: KPAFRELGTCHN, LAMBDASER, KAPLAMBRATIO (kappa/lambda light chains)  No results found for: HGBA, HGBA2QUANT, HGBFQUANT, HGBSQUAN (Hemoglobinopathy evaluation)   No results found for: LDH  No results found for: IRON, TIBC, IRONPCTSAT (Iron and TIBC)  No results found for: FERRITIN  Urinalysis No results found for: COLORURINE, APPEARANCEUR, LABSPEC, PHURINE, GLUCOSEU, HGBUR, BILIRUBINUR, KETONESUR, PROTEINUR, UROBILINOGEN, NITRITE, LEUKOCYTESUR   STUDIES: No results found.   ELIGIBLE FOR AVAILABLE RESEARCH PROTOCOL: no  ASSESSMENT: 34 y.o. [place] woman  status post left breast upper outer quadrant biopsy 05/29/2020 for a clinical T2 N0, stage IIB invasive ductal carcinoma, functionally triple negative, with an MIB-1 of 95%.  (1) genetics testing   (a) No pathogenic variants detected in [facility] Multi-Cancer Panel. The Multi-Cancer Panel offered by [facility] includes sequencing and/or deletion duplication testing  of the following 85 genes: AIP, ALK, APC, ATM, AXIN2,BAP1,  BARD1, BLM, BMPR1A, BRCA1, BRCA2, BRIP1, CASR, CDC73, CDH1, CDK4, CDKN1B, CDKN1C, CDKN2A (p14ARF), CDKN2A (p16INK4a), CEBPA, CHEK2, CTNNA1, DICER1, DIS3L2, EGFR (c.2369C>T, p.Thr790Met variant only), EPCAM (Deletion/duplication testing only), FH, FLCN, GATA2, GPC3, GREM1 (Promoter region deletion/duplication testing only), HOXB13 (c.251G>A, p.Gly84Glu), HRAS, KIT, MAX, MEN1, MET, MITF (c.952G>A, p.Glu318Lys variant only), MLH1, MSH2, MSH3, MSH6, MUTYH, NBN, NF1, NF2, NTHL1, PALB2, PDGFRA, PHOX2B, PMS2, POLD1, POLE, POT1, PRKAR1A, PTCH1, PTEN, RAD50, RAD51C, RAD51D, RB1, RECQL4, RET, RNF43, RUNX1, SDHAF2, SDHA (sequence changes only), SDHB, SDHC, SDHD, SMAD4, SMARCA4, SMARCB1, SMARCE1, STK11, SUFU, TERC, TERT, TMEM127, TP53, TSC1, TSC2, VHL, WRN and WT1. The report date is June 23, 2020.   (2) neoadjuvant chemotherapy consisting of cyclophosphamide and doxorubicin in dose dense fashion x4 started 06/19/2020, completed 08/05/2020 to be followed by paclitaxel and carboplatin weekly x12 starting 08/26/2020  (a) Echocardiogram on 06/13/2020 showed an EF of 60-65%  (3) definitive surgery to follow  (4) postmastectomy radiation as appropriate   PLAN: Marjorie is having an excellent clinical response to her chemotherapy with no palpable disease at present.  I am hoping we will obtain a complete pathologic response when she gets to surgery.  She is ready to start her carboplatin and paclitaxel today.  We reviewed the possible toxicities side effects and complications.  She understands that she  will take Compazine tonight and tomorrow morning and then as needed.  She does not need to take dexamethasone for nausea.  I am going to see her again next week to troubleshoot any problems.  Otherwise we will see her every other week until she gets to the eighth week of treatment  She knows to call for any other issue that may develop before then  Total encounter time 25 minutes.Sarajane Jews C. Louie Meaders, MD 08/26/20 10:03 AM Medical Oncology and Hematology [facility] [address], [place] [address] Tel. [phone]    Fax. [phone]   I, Wilburn Mylar, am acting as scribe for Dr. Virgie Dad. Maheen Cwikla.  I, Lurline Del MD, have reviewed the above documentation for accuracy and completeness, and I agree with the above.   *Total Encounter Time as defined by the Centers for Medicare and Medicaid Services includes, in addition to the face-to-face time of a patient visit (documented in the note above) non-face-to-face time: obtaining and reviewing outside history, ordering and reviewing medications, tests or procedures, care coordination (communications with other health care professionals or caregivers) and documentation in the medical record.

## 2020-08-26 NOTE — Patient Instructions (Signed)

## 2020-08-27 ENCOUNTER — Encounter: Payer: Self-pay | Admitting: *Deleted

## 2020-08-28 ENCOUNTER — Other Ambulatory Visit: Payer: Self-pay | Admitting: *Deleted

## 2020-08-28 NOTE — Telephone Encounter (Signed)
Received call from pt with complaint of burning and irritation in bilateral eyes.  Pt states when she woke up this morning her eyes were very red and irritated.  Pt denies recent trauma or drainage from the eye.  Per MD pt to utilize OTC Visine eye drops as indicted on the back of the box and to take benadryl 25 mg p.o every 6 hours as well as change her pillow cases.  Pt verbalized understanding and appreciative of the advice.

## 2020-08-29 ENCOUNTER — Other Ambulatory Visit: Payer: Self-pay | Admitting: Oncology

## 2020-08-29 DIAGNOSIS — C50412 Malignant neoplasm of upper-outer quadrant of left female breast: Secondary | ICD-10-CM

## 2020-08-29 DIAGNOSIS — Z170 Estrogen receptor positive status [ER+]: Secondary | ICD-10-CM

## 2020-08-29 MED ORDER — LORAZEPAM 0.5 MG PO TABS: 0.5000 mg | ORAL_TABLET | Freq: Every evening | ORAL | 0 refills | Status: DC | PRN

## 2020-08-31 NOTE — Progress Notes (Signed)
[place]  [phone] Fax:([phone]) [phone]     ID: Jeanne Evans DOB: 07/03/1986  MR#: [id number]  DXI#:[id number]  Patient Care Team: [name], MD as PCP - General (Obstetrics and Gynecology) Rockwell Germany, RN as Oncology Nurse Navigator Mauro Kaufmann, RN as Oncology Nurse Navigator Erroll Luna, MD as Consulting Physician (General Surgery) Zameer Borman, Virgie Dad, MD as Consulting Physician (Oncology) Kyung Rudd, MD as Consulting Physician (Radiation Oncology) Servando Salina, MD as Consulting Physician (Obstetrics and Gynecology) Chauncey Cruel, MD OTHER MD:  CHIEF COMPLAINT: 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    She started weekly carboplatin/paclitaxel at her last visit on 08/26/2020. Today is week 2.  She felt a little tired but does not report any other side effects from the treatment that she is aware of  Her neutrophil nadirs have been excellent Lab Results  Component Value Date   NEUTROABS 1.3 (L) 09/01/2020   NEUTROABS 4.1 08/26/2020   NEUTROABS 2.4 08/19/2020   NEUTROABS 3.3 08/05/2020   NEUTROABS 3.9 07/22/2020    REVIEW OF SYSTEMS: Anamae tells me that beginning on Saturday, 08/30/2020, she developed a severe headache in the back of her head and going forward a little bit to both temples.  She is taken ibuprofen 4 times both days without being able to control it.  She also has a cough, which is producing clear phlegm.  She has not had a fever.  She does not have nausea or vomiting.  He has had no visual changes.  There has been no balance issues and no falls.  Detailed review of systems today was otherwise stable.   HISTORY OF CURRENT ILLNESS: From the original intake note:  Jannie D Brimley herself palpated a painful left breast  lump. She underwent bilateral diagnostic mammography with tomography and left breast ultrasonography at [facility] on 05/12/2020 showing: breast density category B; palpable 2.7 cm oval hypoechoic mass in left breast at 2-3 o'clock; 2.2 cm left axillary lymph node/grouping of nodes.  Accordingly on 05/29/2020 she proceeded to biopsy of the left breast area in question. The pathology from this procedure ([id number]) showed: invasive ductal carcinoma, grade 3. Prognostic indicators significant for: estrogen receptor, 10% positive with weak staining intensity and progesterone receptor, 0% negative. Proliferation marker Ki67 at 95%. HER2 equivocal by immunohistochemistry (2+), but negative by fluorescent in situ hybridization with a signals ratio 1.77 and number per cell 2.65.  Biopsy of the left axillary lymph node in question was negative and concordant  The patient's subsequent history is as detailed below.   PAST MEDICAL HISTORY: Past Medical History:  Diagnosis Date  . Breast cancer ([place])   . Family history of ovarian cancer   . Family history of prostate cancer   . History of asthma    has albuterol, uses PRN  . History of heart murmur in childhood   . History of multiple allergies   History of allergy related headaches   PAST SURGICAL HISTORY: Past Surgical History:  Procedure Laterality Date  . PORTACATH PLACEMENT Right 06/18/2020   Procedure: INSERTION PORT-A-CATH WITH ULTRASOUND GUIDANCE;  Surgeon: Erroll Luna, MD;  Location: [place];  Service: General;  Laterality: Right;  She reports having a boil lanced from her perineal area   FAMILY HISTORY: Family History  Problem Relation Age of Onset  . Lung cancer  Maternal Uncle        dx late 24s  . Prostate cancer Paternal Uncle        dx late 9s  . Ovarian cancer Paternal Grandmother        dx 26s  . Prostate cancer Paternal Grandfather        dx 32s  . Prostate cancer Paternal Uncle        dx  early 57s  The patient's father is 25 and her mother 27, as of 05/2020.  The patient has two brothers and one sister. She reports ovarian cancer in her paternal grandmother, prostate cancer in her paternal grandfather and a paternal uncle, and lung cancer in a maternal uncle.   GYNECOLOGIC HISTORY:  No LMP recorded. (Menstrual status: IUD). Menarche: 33 years old Age at first live birth: 34 years old [place] P 2 LMP current, experiences spotting Contraceptive: Mirena IUD in place HRT n/a  Hysterectomy? no BSO? 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 She describes herself as single. She lives at home with her children, [name] (age 31) and [name] (age 76).     ADVANCED DIRECTIVES: Not in place. She intends to name her mother, Barbra Miner, as her HCPOA.  [name] lives in [place] and can be reached at [phone].  The patient was given the appropriate documents to complete and notarized at her discretion at the time of her visit 06/04/2020   HEALTH MAINTENANCE: Social History   Tobacco Use  . Smoking status: Never Smoker  . Smokeless tobacco: Never Used  Substance Use Topics  . Alcohol use: Yes    Comment: occas  . Drug use: Yes    Types: Marijuana     Colonoscopy: n/a (age)  PAP: 04/2020  Bone density: n/a (age)   Allergies  Allergen Reactions  . Sulfa Antibiotics Hives    Current Outpatient Medications  Medication Sig Dispense Refill  . docusate sodium (COLACE) 100 MG capsule Take 2 capsules (200 mg total) by mouth daily. 10 capsule 0  . fluconazole (DIFLUCAN) 100 MG tablet Take 1 tablet (100 mg total) by mouth daily. 7 tablet 0  . ibuprofen (ADVIL) 800 MG tablet Take 1 tablet (800 mg total) by mouth every 8 (eight) hours as needed. 30 tablet 0  . levonorgestrel (MIRENA) 20 MCG/24HR IUD 1 each by Intrauterine route once.    . lidocaine-prilocaine (EMLA) cream  Apply to affected area once 30 g 3  . LORazepam (ATIVAN) 0.5 MG tablet Take 1 tablet (0.5 mg total) by mouth at bedtime as needed (Nausea or vomiting). 20 tablet 0  . magic mouthwash SOLN Take 5 mLs by mouth 4 (four) times daily as needed for mouth pain. 240 mL 1  . omeprazole (PRILOSEC) 40 MG capsule Take 1 capsule (40 mg total) by mouth at bedtime. 7 capsule 0  . pegfilgrastim-bmez (ZIEXTENZO) 6 MG/0.6ML injection Inject 0.6 mLs (6 mg total) into the skin every 14 (fourteen) days. 1 Syringe 3  . prochlorperazine (COMPAZINE) 10 MG tablet Take 1 tablet (10 mg total) by mouth every 6 (six) hours as needed (Nausea or vomiting). 30 tablet 1  . venlafaxine XR (EFFEXOR-XR) 75 MG 24 hr capsule Take 1 capsule (75 mg total) by mouth daily with breakfast. 30 capsule 6  . zolmitriptan (ZOMIG) 5 MG tablet Take 1 tablet (5 mg total) by mouth as needed for migraine. 10 tablet 2   No  current facility-administered medications for this visit.    OBJECTIVE: African-American woman who appears stated age 3:   09/01/20 1200  BP: 130/87  Pulse: 93  Resp: 18  Temp: 98.1 F (36.7 C)  SpO2: 98%     Body mass index is 26.61 kg/m.   Wt Readings from Last 3 Encounters:  09/01/20 152 lb 9.6 oz (69.2 kg)  08/26/20 151 lb (68.5 kg)  08/19/20 161 lb (73 kg)  ECOG FS:1 - Symptomatic but completely ambulatory  Sclerae unicteric, EOMs intact Wearing a mask No cervical or supraclavicular adenopathy Lungs no rales or rhonchi Heart regular rate and rhythm Abd soft, nontender, positive bowel sounds MSK no focal spinal tenderness, no upper extremity lymphedema Neuro: nonfocal, well oriented, appropriate affect Breasts: Deferred   LAB RESULTS:  CMP     Component Value Date/Time   NA 140 08/26/2020 0935   K 4.0 08/26/2020 0935   CL 110 08/26/2020 0935   CO2 25 08/26/2020 0935   GLUCOSE 101 (H) 08/26/2020 0935   BUN 5 (L) 08/26/2020 0935   CREATININE 0.78 08/26/2020 0935   CREATININE 0.92 06/04/2020  0830   CALCIUM 9.2 08/26/2020 0935   PROT 6.8 08/26/2020 0935   ALBUMIN 3.4 (L) 08/26/2020 0935   AST 29 08/26/2020 0935   AST 13 (L) 06/04/2020 0830   ALT 47 (H) 08/26/2020 0935   ALT 14 06/04/2020 0830   ALKPHOS 69 08/26/2020 0935   BILITOT 0.3 08/26/2020 0935   BILITOT 0.5 06/04/2020 0830   GFRNONAA >60 08/26/2020 0935   GFRNONAA >60 06/04/2020 0830   GFRAA >60 08/05/2020 0955   GFRAA >60 06/04/2020 0830    No results found for: TOTALPROTELP, ALBUMINELP, A1GS, A2GS, BETS, BETA2SER, GAMS, MSPIKE, SPEI  Lab Results  Component Value Date   WBC 2.4 (L) 09/01/2020   NEUTROABS 1.3 (L) 09/01/2020   HGB 9.5 (L) 09/01/2020   HCT 27.9 (L) 09/01/2020   MCV 88.0 09/01/2020   PLT 414 (H) 09/01/2020    No results found for: LABCA2  No components found for: [id number]  No results for input(s): INR in the last 168 hours.  No results found for: LABCA2  No results found for: UYQ034  No results found for: VQQ595  No results found for: GLO756  No results found for: CA2729  No components found for: HGQUANT  No results found for: CEA1 / No results found for: CEA1   No results found for: AFPTUMOR  No results found for: CHROMOGRNA  No results found for: KPAFRELGTCHN, LAMBDASER, KAPLAMBRATIO (kappa/lambda light chains)  No results found for: HGBA, HGBA2QUANT, HGBFQUANT, HGBSQUAN (Hemoglobinopathy evaluation)   No results found for: LDH  No results found for: IRON, TIBC, IRONPCTSAT (Iron and TIBC)  No results found for: FERRITIN  Urinalysis No results found for: COLORURINE, APPEARANCEUR, LABSPEC, PHURINE, GLUCOSEU, HGBUR, BILIRUBINUR, KETONESUR, PROTEINUR, UROBILINOGEN, NITRITE, LEUKOCYTESUR   STUDIES: No results found.   ELIGIBLE FOR AVAILABLE RESEARCH PROTOCOL: no  ASSESSMENT: 34 y.o. [place] woman status post left breast upper outer quadrant biopsy 05/29/2020 for a clinical T2 N0, stage IIB invasive ductal carcinoma, functionally triple negative, with an  MIB-1 of 95%.  (1) genetics testing   (a) No pathogenic variants detected in [facility] Multi-Cancer Panel. The Multi-Cancer Panel offered by [facility] includes sequencing and/or deletion duplication testing of the following 85 genes: AIP, ALK, APC, ATM, AXIN2,BAP1,  BARD1, BLM, BMPR1A, BRCA1, BRCA2, BRIP1, CASR, CDC73, CDH1, CDK4, CDKN1B, CDKN1C, CDKN2A (p14ARF), CDKN2A (p16INK4a), CEBPA, CHEK2, CTNNA1, DICER1, DIS3L2, EGFR (c.2369C>T, p.Thr790Met  variant only), EPCAM (Deletion/duplication testing only), FH, FLCN, GATA2, GPC3, GREM1 (Promoter region deletion/duplication testing only), HOXB13 (c.251G>A, p.Gly84Glu), HRAS, KIT, MAX, MEN1, MET, MITF (c.952G>A, p.Glu318Lys variant only), MLH1, MSH2, MSH3, MSH6, MUTYH, NBN, NF1, NF2, NTHL1, PALB2, PDGFRA, PHOX2B, PMS2, POLD1, POLE, POT1, PRKAR1A, PTCH1, PTEN, RAD50, RAD51C, RAD51D, RB1, RECQL4, RET, RNF43, RUNX1, SDHAF2, SDHA (sequence changes only), SDHB, SDHC, SDHD, SMAD4, SMARCA4, SMARCB1, SMARCE1, STK11, SUFU, TERC, TERT, TMEM127, TP53, TSC1, TSC2, VHL, WRN and WT1. The report date is June 23, 2020.   (2) neoadjuvant chemotherapy consisting of cyclophosphamide and doxorubicin in dose dense fashion x4 started 06/19/2020, completed 08/05/2020 to be followed by paclitaxel and carboplatin weekly x12 starting 08/26/2020  (a) Echocardiogram on 06/13/2020 showed an EF of 60-65%  (3) definitive surgery to follow  (4) postmastectomy radiation as appropriate   PLAN: Naileah has not been vaccinated for COVID-19 and I am concerned with her headache and cough that she may actually have the disease.  She is going to go to her local pharmacy to get tested.  We are canceling her treatment today.  I will see her again next week for her second cycle of Taxol and carbo.  If she does have Covid however that may need to be postponed and she may need to be referred for the antibody infusion instead  Total encounter time 25 minutes.*  Addendum, dictated 09/03/2020: The  patient was contacted today.  She tells me she was called and told that she tested negative.  We will resume chemotherapy 09/09/2020.   Virgie Dad. Eleanna Theilen, MD 09/01/20 12:09 PM Medical Oncology [facility] H[facility] [place], [place] [address] Tel. [phone]    Fax. [phone]   I, Wilburn Mylar, am acting as scribe for Dr. Virgie Dad. Tanvi Gatling.  I, Lurline Del MD, have reviewed the above documentation for accuracy and completeness, and I agree with the above.   *Total Encounter Time as defined by the Centers for Medicare and Medicaid Services includes, in addition to the face-to-face time of a patient visit (documented in the note above) non-face-to-face time: obtaining and reviewing outside history, ordering and reviewing medications, tests or procedures, care coordination (communications with other health care professionals or caregivers) and documentation in the medical record.

## 2020-09-01 ENCOUNTER — Inpatient Hospital Stay: Payer: 59

## 2020-09-01 ENCOUNTER — Other Ambulatory Visit: Payer: Self-pay

## 2020-09-01 ENCOUNTER — Encounter: Payer: Self-pay | Admitting: *Deleted

## 2020-09-01 ENCOUNTER — Inpatient Hospital Stay (HOSPITAL_BASED_OUTPATIENT_CLINIC_OR_DEPARTMENT_OTHER): Payer: 59 | Admitting: Oncology

## 2020-09-01 ENCOUNTER — Other Ambulatory Visit: Payer: 59

## 2020-09-01 VITALS — BP 130/87 | HR 93 | Temp 98.1°F | Resp 18 | Ht 63.5 in | Wt 152.6 lb

## 2020-09-01 DIAGNOSIS — Z5111 Encounter for antineoplastic chemotherapy: Secondary | ICD-10-CM | POA: Diagnosis not present

## 2020-09-01 DIAGNOSIS — C50412 Malignant neoplasm of upper-outer quadrant of left female breast: Secondary | ICD-10-CM

## 2020-09-01 DIAGNOSIS — Z170 Estrogen receptor positive status [ER+]: Secondary | ICD-10-CM

## 2020-09-01 DIAGNOSIS — Z20822 Contact with and (suspected) exposure to covid-19: Secondary | ICD-10-CM

## 2020-09-01 LAB — COMPREHENSIVE METABOLIC PANEL
ALT: 79 U/L — ABNORMAL HIGH (ref 0–44)
AST: 32 U/L (ref 15–41)
Albumin: 3.7 g/dL (ref 3.5–5.0)
Alkaline Phosphatase: 52 U/L (ref 38–126)
Anion gap: 6 (ref 5–15)
BUN: 9 mg/dL (ref 6–20)
CO2: 29 mmol/L (ref 22–32)
Calcium: 9.8 mg/dL (ref 8.9–10.3)
Chloride: 103 mmol/L (ref 98–111)
Creatinine, Ser: 0.69 mg/dL (ref 0.44–1.00)
GFR, Estimated: >60 mL/min (ref >60)
Glucose, Bld: 102 mg/dL — ABNORMAL HIGH (ref 70–99)
Potassium: 4 mmol/L (ref 3.5–5.1)
Sodium: 138 mmol/L (ref 135–145)
Total Bilirubin: 0.6 mg/dL (ref 0.3–1.2)
Total Protein: 7.3 g/dL (ref 6.5–8.1)

## 2020-09-01 LAB — CBC WITH DIFFERENTIAL/PLATELET
Abs Immature Granulocytes: 0.02 10*3/uL (ref 0.00–0.07)
Basophils Absolute: 0 10*3/uL (ref 0.0–0.1)
Basophils Relative: 1 %
Eosinophils Absolute: 0 10*3/uL (ref 0.0–0.5)
Eosinophils Relative: 0 %
HCT: 27.9 % — ABNORMAL LOW (ref 36.0–46.0)
Hemoglobin: 9.5 g/dL — ABNORMAL LOW (ref 12.0–15.0)
Immature Granulocytes: 1 %
Lymphocytes Relative: 33 %
Lymphs Abs: 0.8 10*3/uL (ref 0.7–4.0)
MCH: 30 pg (ref 26.0–34.0)
MCHC: 34.1 g/dL (ref 30.0–36.0)
MCV: 88 fL (ref 80.0–100.0)
Monocytes Absolute: 0.3 10*3/uL (ref 0.1–1.0)
Monocytes Relative: 11 %
Neutro Abs: 1.3 10*3/uL — ABNORMAL LOW (ref 1.7–7.7)
Neutrophils Relative %: 54 %
Platelets: 414 10*3/uL — ABNORMAL HIGH (ref 150–400)
RBC: 3.17 MIL/uL — ABNORMAL LOW (ref 3.87–5.11)
RDW: 16.2 % — ABNORMAL HIGH (ref 11.5–15.5)
WBC: 2.4 10*3/uL — ABNORMAL LOW (ref 4.0–10.5)
nRBC: 0 % (ref 0.0–0.2)

## 2020-09-01 MED ORDER — SODIUM CHLORIDE 0.9 % IV SOLN
Freq: Once | INTRAVENOUS | Status: DC
  Filled 2020-09-01: qty 250

## 2020-09-01 MED ORDER — HEPARIN SOD (PORK) LOCK FLUSH 100 UNIT/ML IV SOLN
500.0000 [IU] | Freq: Once | INTRAVENOUS | Status: AC
  Administered 2020-09-01: 500 [IU]
  Filled 2020-09-01: qty 5

## 2020-09-01 MED ORDER — SODIUM CHLORIDE 0.9% FLUSH
10.0000 mL | Freq: Once | INTRAVENOUS | Status: AC
  Administered 2020-09-01: 10 mL
  Filled 2020-09-01: qty 10

## 2020-09-01 NOTE — Progress Notes (Signed)
Pt seen by Dr Jana Hakim and will not receive tx today d/t cold sx. Pt's port deaccessed. Marita Kansas, RN made aware.

## 2020-09-02 ENCOUNTER — Telehealth: Payer: Self-pay

## 2020-09-02 LAB — SARS-COV-2, NAA 2 DAY TAT

## 2020-09-02 LAB — NOVEL CORONAVIRUS, NAA: SARS-CoV-2, NAA: NOT DETECTED

## 2020-09-02 NOTE — Telephone Encounter (Signed)
This LPN called to check on pt, and saw pt's COVID test results are not yet back. Pt reports sx have improved slightly with "allergy medicine" and she is isolating as advised until test results come back. Pt understands we will continue to monitor her by phone, and verbalizes thanks for this.

## 2020-09-08 NOTE — Progress Notes (Signed)
Jeanne Evans  [phone] [phone]) [phone]     ID: Jeanne Evans DOB: 1986-02-27  MR#: [id number]  GGY#:[id number]  Patient Care Team: [name], MD as PCP - General (Obstetrics and Gynecology) Rockwell Germany, RN as Oncology Nurse Navigator Mauro Kaufmann, RN as Oncology Nurse Navigator Erroll Luna, MD as Consulting Physician (General Surgery) Magrinat, Virgie Dad, MD as Consulting Physician (Oncology) Kyung Rudd, MD as Consulting Physician (Radiation Oncology) Servando Salina, MD as Consulting Physician (Obstetrics and Gynecology) Chauncey Cruel, MD OTHER MD:  CHIEF COMPLAINT: 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    She started weekly carboplatin/paclitaxel on 08/26/2020.  She did generally well with the first cycle.  However cycle #2 was held last week due to cold symptoms.  We set her up for COVID-19 testing unfortunately she tested negative.  She is here today for cycle #2 of carboplatin/paclitaxel.  Her neutrophil nadirs have been as follows Lab Results  Component Value Date   NEUTROABS 1.3 (L) 09/01/2020   NEUTROABS 4.1 08/26/2020   NEUTROABS 2.4 08/19/2020   NEUTROABS 3.3 08/05/2020   NEUTROABS 3.9 07/22/2020    REVIEW OF SYSTEMS: Jeanne Evans still has a little bit of a dry cough at times but it is very intermittent.  She never developed any fever rash or other issues other than a mild headache which has resolved.  She has been slightly constipated and takes MiraLAX for that.  She has not yet received the vaccine for COVID-19 but everybody else in her family has.  A detailed review of systems today was otherwise stable   HISTORY OF CURRENT ILLNESS: From the original intake note:  Jeanne Evans herself palpated a painful left breast lump. She  underwent bilateral diagnostic mammography with tomography and left breast ultrasonography at [facility] on 05/12/2020 showing: breast density category B; palpable 2.7 cm oval hypoechoic mass in left breast at 2-3 o'clock; 2.2 cm left axillary lymph node/grouping of nodes.  Accordingly on 05/29/2020 she proceeded to biopsy of the left breast area in question. The pathology from this procedure ([id number]) showed: invasive ductal carcinoma, grade 3. Prognostic indicators significant for: estrogen receptor, 10% positive with weak staining intensity and progesterone receptor, 0% negative. Proliferation marker Ki67 at 95%. HER2 equivocal by immunohistochemistry (2+), but negative by fluorescent in situ hybridization with a signals ratio 1.77 and number per cell 2.65.  Biopsy of the left axillary lymph node in question was negative and concordant  The patient's subsequent history is as detailed below.   PAST MEDICAL HISTORY: Past Medical History:  Diagnosis Date  . Breast cancer ([place])   . Family history of ovarian cancer   . Family history of prostate cancer   . History of asthma    has albuterol, uses PRN  . History of heart murmur in childhood   . History of multiple allergies   History of allergy related headaches   PAST SURGICAL HISTORY: Past Surgical History:  Procedure Laterality Date  . PORTACATH PLACEMENT Right 06/18/2020   Procedure: INSERTION PORT-A-CATH WITH ULTRASOUND GUIDANCE;  Surgeon: Erroll Luna, MD;  Location: [facility];  Service: General;  Laterality: Right;  She reports having a boil lanced from her perineal area   FAMILY HISTORY: Family History  Problem Relation Age of Onset  . Lung cancer Maternal Uncle  dx late 38s  . Prostate cancer Paternal Uncle        dx late 59s  . Ovarian cancer Paternal Grandmother        dx 82s  . Prostate cancer Paternal Grandfather        dx 72s  . Prostate cancer Paternal Uncle        dx early 50s   The patient's father is 61 and her mother 38, as of 05/2020.  The patient has two brothers and one sister. She reports ovarian cancer in her paternal grandmother, prostate cancer in her paternal grandfather and a paternal uncle, and lung cancer in a maternal uncle.   GYNECOLOGIC HISTORY:  No LMP recorded. (Menstrual status: IUD). Menarche: 34 years old Age at first live birth: 34 years old [place] P 2 LMP current, experiences spotting Contraceptive: Mirena IUD in place HRT n/a  Hysterectomy? no BSO? 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 She describes herself as single. She lives at home with her children, Jeanne Evans (age 25) and Jeanne Evans (age 52).     ADVANCED DIRECTIVES: Not in place. She intends to name her mother, Jeanne Evans, as her HCPOA.  Jeanne Evans lives in [place] and can be reached at [phone].  The patient was given the appropriate documents to complete and notarized at her discretion at the time of her visit 06/04/2020   HEALTH MAINTENANCE: Social History   Tobacco Use  . Smoking status: Never Smoker  . Smokeless tobacco: Never Used  Substance Use Topics  . Alcohol use: Yes    Comment: occas  . Drug use: Yes    Types: Marijuana     Colonoscopy: n/a (age)  PAP: 04/2020  Bone density: n/a (age)   Allergies  Allergen Reactions  . Sulfa Antibiotics Hives    Current Outpatient Medications  Medication Sig Dispense Refill  . docusate sodium (COLACE) 100 MG capsule Take 2 capsules (200 mg total) by mouth daily. 10 capsule 0  . fluconazole (DIFLUCAN) 100 MG tablet Take 1 tablet (100 mg total) by mouth daily. 7 tablet 0  . ibuprofen (ADVIL) 800 MG tablet Take 1 tablet (800 mg total) by mouth every 8 (eight) hours as needed. 30 tablet 0  . levonorgestrel (MIRENA) 20 MCG/24HR IUD 1 each by Intrauterine route once.    . lidocaine-prilocaine (EMLA) cream Apply to  affected area once 30 g 3  . LORazepam (ATIVAN) 0.5 MG tablet Take 1 tablet (0.5 mg total) by mouth at bedtime as needed (Nausea or vomiting). 20 tablet 0  . magic mouthwash SOLN Take 5 mLs by mouth 4 (four) times daily as needed for mouth pain. 240 mL 1  . omeprazole (PRILOSEC) 40 MG capsule Take 1 capsule (40 mg total) by mouth at bedtime. 7 capsule 0  . pegfilgrastim-bmez (ZIEXTENZO) 6 MG/0.6ML injection Inject 0.6 mLs (6 mg total) into the skin every 14 (fourteen) days. 1 Syringe 3  . prochlorperazine (COMPAZINE) 10 MG tablet Take 1 tablet (10 mg total) by mouth every 6 (six) hours as needed (Nausea or vomiting). 30 tablet 1  . venlafaxine XR (EFFEXOR-XR) 75 MG 24 hr capsule Take 1 capsule (75 mg total) by mouth daily with breakfast. 30 capsule 6  . zolmitriptan (ZOMIG) 5 MG tablet Take 1 tablet (5 mg total) by mouth as needed for migraine. 10 tablet 2   No current facility-administered medications for this visit.  OBJECTIVE: African-American woman who appears stated age There were no vitals filed for this visit.   There is no height or weight on file to calculate BMI.   Wt Readings from Last 3 Encounters:  09/01/20 152 lb 9.6 oz (69.2 kg)  08/26/20 151 lb (68.5 kg)  08/19/20 161 lb (73 kg)  ECOG FS:1 - Symptomatic but completely ambulatory  Sclerae unicteric, EOMs intact Wearing a mask No cervical or supraclavicular adenopathy Lungs no rales or rhonchi Heart regular rate and rhythm Abd soft, nontender, positive bowel sounds MSK no focal spinal tenderness, no upper extremity lymphedema Neuro: nonfocal, well oriented, appropriate affect Breasts: Deferred   LAB RESULTS:  CMP     Component Value Date/Time   NA 138 09/01/2020 1121   K 4.0 09/01/2020 1121   CL 103 09/01/2020 1121   CO2 29 09/01/2020 1121   GLUCOSE 102 (H) 09/01/2020 1121   BUN 9 09/01/2020 1121   CREATININE 0.69 09/01/2020 1121   CREATININE 0.92 06/04/2020 0830   CALCIUM 9.8 09/01/2020 1121   PROT 7.3  09/01/2020 1121   ALBUMIN 3.7 09/01/2020 1121   AST 32 09/01/2020 1121   AST 13 (L) 06/04/2020 0830   ALT 79 (H) 09/01/2020 1121   ALT 14 06/04/2020 0830   ALKPHOS 52 09/01/2020 1121   BILITOT 0.6 09/01/2020 1121   BILITOT 0.5 06/04/2020 0830   GFRNONAA >60 09/01/2020 1121   GFRNONAA >60 06/04/2020 0830   GFRAA >60 08/05/2020 0955   GFRAA >60 06/04/2020 0830    No results found for: TOTALPROTELP, ALBUMINELP, A1GS, A2GS, BETS, BETA2SER, GAMS, MSPIKE, SPEI  Lab Results  Component Value Date   WBC 2.4 (L) 09/01/2020   NEUTROABS 1.3 (L) 09/01/2020   HGB 9.5 (L) 09/01/2020   HCT 27.9 (L) 09/01/2020   MCV 88.0 09/01/2020   PLT 414 (H) 09/01/2020    No results found for: LABCA2  No components found for: IDPOEU235  No results for input(s): INR in the last 168 hours.  No results found for: LABCA2  No results found for: TIR443  No results found for: XVQ008  No results found for: QPY195  No results found for: CA2729  No components found for: HGQUANT  No results found for: CEA1 / No results found for: CEA1   No results found for: AFPTUMOR  No results found for: CHROMOGRNA  No results found for: KPAFRELGTCHN, LAMBDASER, KAPLAMBRATIO (kappa/lambda light chains)  No results found for: HGBA, HGBA2QUANT, HGBFQUANT, HGBSQUAN (Hemoglobinopathy evaluation)   No results found for: LDH  No results found for: IRON, TIBC, IRONPCTSAT (Iron and TIBC)  No results found for: FERRITIN  Urinalysis No results found for: COLORURINE, APPEARANCEUR, LABSPEC, PHURINE, GLUCOSEU, HGBUR, BILIRUBINUR, KETONESUR, PROTEINUR, UROBILINOGEN, NITRITE, LEUKOCYTESUR   STUDIES: No results found.   ELIGIBLE FOR AVAILABLE RESEARCH PROTOCOL: no  ASSESSMENT: 34 y.o. [place] woman status post left breast upper outer quadrant biopsy 05/29/2020 for a clinical T2 N0, stage IIB invasive ductal carcinoma, functionally triple negative, with an MIB-1 of 95%.  (1) genetics testing 06/23/2020 through  the [facility]-Cancer Panel found no deleterious mutations in AIP, ALK, APC, ATM, AXIN2,BAP1,  BARD1, BLM, BMPR1A, BRCA1, BRCA2, BRIP1, CASR, CDC73, CDH1, CDK4, CDKN1B, CDKN1C, CDKN2A (p14ARF), CDKN2A (p16INK4a), CEBPA, CHEK2, CTNNA1, DICER1, DIS3L2, EGFR (c.2369C>T, p.Thr790Met variant only), EPCAM (Deletion/duplication testing only), FH, FLCN, GATA2, GPC3, GREM1 (Promoter region deletion/duplication testing only), HOXB13 (c.251G>A, p.Gly84Glu), HRAS, KIT, MAX, MEN1, MET, MITF (c.952G>A, p.Glu318Lys variant only), MLH1, MSH2, MSH3, MSH6, MUTYH, NBN, NF1, NF2, NTHL1, PALB2, PDGFRA,  PHOX2B, PMS2, POLD1, POLE, POT1, PRKAR1A, PTCH1, PTEN, RAD50, RAD51C, RAD51D, RB1, RECQL4, RET, RNF43, RUNX1, SDHAF2, SDHA (sequence changes only), SDHB, SDHC, SDHD, SMAD4, SMARCA4, SMARCB1, SMARCE1, STK11, SUFU, TERC, TERT, TMEM127, TP53, TSC1, TSC2, VHL, WRN and WT1.   (2) neoadjuvant chemotherapy consisting of cyclophosphamide and doxorubicin in dose dense fashion x4 started 06/19/2020, completed 08/05/2020, followed by paclitaxel and carboplatin weekly x12 starting 08/26/2020  (a) Echocardiogram on 06/13/2020 showed an EF of 60-65%  (3) definitive surgery to follow  (4) postmastectomy radiation as appropriate   PLAN: Shravya luckily did not approved to have had Covid.  We are proceeding with her second cycle of paclitaxel and carboplatin today.  Her ANC is borderline.  She may need to have some Neupogen bio similar days 3 4 and 5 of each cycle but we are going to wait until next week to make that determination.  I have encouraged her to go ahead and receive her Covid vaccine this week.  She knows to call for any other issue that may develop before the next visit  Total encounter time 20 minutes.   Virgie Dad. Magrinat, MD 09/09/20 7:43 AM Medical Oncology and Hematology [facility] [place], [place] [address] Tel. [phone]    Fax. [phone]   I, Wilburn Mylar, am  acting as scribe for Dr. Virgie Dad. Magrinat.  I, Lurline Del MD, have reviewed the above documentation for accuracy and completeness, and I agree with the above.   *Total Encounter Time as defined by the Centers for Medicare and Medicaid Services includes, in addition to the face-to-face time of a patient visit (documented in the note above) non-face-to-face time: obtaining and reviewing outside history, ordering and reviewing medications, tests or procedures, care coordination (communications with other health care professionals or caregivers) and documentation in the medical record.

## 2020-09-09 ENCOUNTER — Encounter: Payer: Self-pay | Admitting: *Deleted

## 2020-09-09 ENCOUNTER — Inpatient Hospital Stay: Payer: 59

## 2020-09-09 ENCOUNTER — Other Ambulatory Visit: Payer: Self-pay

## 2020-09-09 ENCOUNTER — Inpatient Hospital Stay (HOSPITAL_BASED_OUTPATIENT_CLINIC_OR_DEPARTMENT_OTHER): Payer: 59 | Admitting: Oncology

## 2020-09-09 VITALS — BP 135/89 | HR 84 | Temp 97.0°F | Resp 20 | Ht 62.5 in | Wt 162.8 lb

## 2020-09-09 DIAGNOSIS — Z170 Estrogen receptor positive status [ER+]: Secondary | ICD-10-CM

## 2020-09-09 DIAGNOSIS — C50412 Malignant neoplasm of upper-outer quadrant of left female breast: Secondary | ICD-10-CM

## 2020-09-09 DIAGNOSIS — Z5111 Encounter for antineoplastic chemotherapy: Secondary | ICD-10-CM | POA: Diagnosis not present

## 2020-09-09 LAB — COMPREHENSIVE METABOLIC PANEL
ALT: 49 U/L — ABNORMAL HIGH (ref 0–44)
AST: 30 U/L (ref 15–41)
Albumin: 3.6 g/dL (ref 3.5–5.0)
Alkaline Phosphatase: 50 U/L (ref 38–126)
Anion gap: 6 (ref 5–15)
BUN: 10 mg/dL (ref 6–20)
CO2: 25 mmol/L (ref 22–32)
Calcium: 9.4 mg/dL (ref 8.9–10.3)
Chloride: 108 mmol/L (ref 98–111)
Creatinine, Ser: 0.78 mg/dL (ref 0.44–1.00)
GFR, Estimated: >60 mL/min (ref >60)
Glucose, Bld: 120 mg/dL — ABNORMAL HIGH (ref 70–99)
Potassium: 4.2 mmol/L (ref 3.5–5.1)
Sodium: 139 mmol/L (ref 135–145)
Total Bilirubin: 0.4 mg/dL (ref 0.3–1.2)
Total Protein: 6.8 g/dL (ref 6.5–8.1)

## 2020-09-09 LAB — CBC WITH DIFFERENTIAL/PLATELET
Abs Immature Granulocytes: 0 10*3/uL (ref 0.00–0.07)
Basophils Absolute: 0 10*3/uL (ref 0.0–0.1)
Basophils Relative: 1 %
Eosinophils Absolute: 0.1 10*3/uL (ref 0.0–0.5)
Eosinophils Relative: 3 %
HCT: 27 % — ABNORMAL LOW (ref 36.0–46.0)
Hemoglobin: 8.9 g/dL — ABNORMAL LOW (ref 12.0–15.0)
Immature Granulocytes: 0 %
Lymphocytes Relative: 32 %
Lymphs Abs: 1.1 10*3/uL (ref 0.7–4.0)
MCH: 30.4 pg (ref 26.0–34.0)
MCHC: 33 g/dL (ref 30.0–36.0)
MCV: 92.2 fL (ref 80.0–100.0)
Monocytes Absolute: 0.9 10*3/uL (ref 0.1–1.0)
Monocytes Relative: 25 %
Neutro Abs: 1.4 10*3/uL — ABNORMAL LOW (ref 1.7–7.7)
Neutrophils Relative %: 39 %
Platelets: 278 10*3/uL (ref 150–400)
RBC: 2.93 MIL/uL — ABNORMAL LOW (ref 3.87–5.11)
RDW: 18 % — ABNORMAL HIGH (ref 11.5–15.5)
WBC: 3.6 10*3/uL — ABNORMAL LOW (ref 4.0–10.5)
nRBC: 0 % (ref 0.0–0.2)

## 2020-09-09 MED ORDER — SODIUM CHLORIDE 0.9 % IV SOLN
270.0000 mg | Freq: Once | INTRAVENOUS | Status: AC
  Administered 2020-09-09: 270 mg via INTRAVENOUS
  Filled 2020-09-09: qty 27

## 2020-09-09 MED ORDER — SODIUM CHLORIDE 0.9 % IV SOLN
16.0000 mg | Freq: Once | INTRAVENOUS | Status: AC
  Administered 2020-09-09: 16 mg via INTRAVENOUS
  Filled 2020-09-09: qty 8

## 2020-09-09 MED ORDER — SODIUM CHLORIDE 0.9 % IV SOLN
80.0000 mg/m2 | Freq: Once | INTRAVENOUS | Status: AC
  Administered 2020-09-09: 144 mg via INTRAVENOUS
  Filled 2020-09-09: qty 24

## 2020-09-09 MED ORDER — SODIUM CHLORIDE 0.9 % IV SOLN
10.0000 mg | Freq: Once | INTRAVENOUS | Status: AC
  Administered 2020-09-09: 10 mg via INTRAVENOUS
  Filled 2020-09-09: qty 1
  Filled 2020-09-09: qty 10

## 2020-09-09 MED ORDER — SODIUM CHLORIDE 0.9% FLUSH
10.0000 mL | INTRAVENOUS | Status: DC | PRN
  Filled 2020-09-09: qty 10

## 2020-09-09 MED ORDER — FAMOTIDINE IN NACL 20-0.9 MG/50ML-% IV SOLN
INTRAVENOUS | Status: AC
  Filled 2020-09-09: qty 50

## 2020-09-09 MED ORDER — DIPHENHYDRAMINE HCL 50 MG/ML IJ SOLN
INTRAMUSCULAR | Status: AC
  Filled 2020-09-09: qty 1

## 2020-09-09 MED ORDER — DIPHENHYDRAMINE HCL 50 MG/ML IJ SOLN
25.0000 mg | Freq: Once | INTRAMUSCULAR | Status: AC
  Administered 2020-09-09: 25 mg via INTRAVENOUS

## 2020-09-09 MED ORDER — SODIUM CHLORIDE 0.9 % IV SOLN
Freq: Once | INTRAVENOUS | Status: AC
  Filled 2020-09-09: qty 250

## 2020-09-09 MED ORDER — SODIUM CHLORIDE 0.9% FLUSH
10.0000 mL | Freq: Once | INTRAVENOUS | Status: AC
  Administered 2020-09-09: 10 mL
  Filled 2020-09-09: qty 10

## 2020-09-09 MED ORDER — FAMOTIDINE IN NACL 20-0.9 MG/50ML-% IV SOLN
20.0000 mg | Freq: Once | INTRAVENOUS | Status: AC
  Administered 2020-09-09: 20 mg via INTRAVENOUS

## 2020-09-09 MED ORDER — HEPARIN SOD (PORK) LOCK FLUSH 100 UNIT/ML IV SOLN
500.0000 [IU] | Freq: Once | INTRAVENOUS | Status: DC | PRN
  Filled 2020-09-09: qty 5

## 2020-09-09 NOTE — Progress Notes (Signed)
Per Dr. Jana Hakim, ok to proceed with [place] 1.4.

## 2020-09-09 NOTE — Patient Instructions (Signed)
[facility] Discharge Instructions for Patients Receiving Chemotherapy  Today you received the following chemotherapy agents:  Taxol, carboplatin  To help prevent nausea and vomiting after your treatment, we encourage you to take your nausea medication as prescribed.   If you develop nausea and vomiting that is not controlled by your nausea medication, call the clinic.   BELOW ARE SYMPTOMS THAT SHOULD BE REPORTED IMMEDIATELY:  *FEVER GREATER THAN 100.5 F  *CHILLS WITH OR WITHOUT FEVER  NAUSEA AND VOMITING THAT IS NOT CONTROLLED WITH YOUR NAUSEA MEDICATION  *UNUSUAL SHORTNESS OF BREATH  *UNUSUAL BRUISING OR BLEEDING  TENDERNESS IN MOUTH AND THROAT WITH OR WITHOUT PRESENCE OF ULCERS  *URINARY PROBLEMS  *BOWEL PROBLEMS  UNUSUAL RASH Items with * indicate a potential emergency and should be followed up as soon as possible.  Feel free to call the clinic should you have any questions or concerns. The clinic phone number is [phone].  Please show the CHEMO ALERT CARD at check-in to the Emergency Department and triage nurse.   

## 2020-09-12 ENCOUNTER — Telehealth: Payer: Self-pay | Admitting: Oncology

## 2020-09-12 NOTE — Telephone Encounter (Signed)
No 10/26 los, no changes made to pt schedule

## 2020-09-15 NOTE — Progress Notes (Signed)
[place]  [phone] [phone]     ID: Jeanne Evans DOB: 24-May-1986  MR#: [id number]  XFG#:[id number]  Patient Care Team: [name], MD as PCP - General (Obstetrics and Gynecology) Rockwell Germany, RN as Oncology Nurse Navigator Mauro Kaufmann, RN as Oncology Nurse Navigator Erroll Luna, MD as Consulting Physician (General Surgery) Yitzhak Awan, Virgie Dad, MD as Consulting Physician (Oncology) Kyung Rudd, MD as Consulting Physician (Radiation Oncology) Servando Salina, MD as Consulting Physician (Obstetrics and Gynecology) Chauncey Cruel, MD OTHER MD:  CHIEF COMPLAINT: 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    She completed 4 cycles of Cytoxan and doxorubicin and started weekly carboplatin/paclitaxel on 08/26/2020.  Today is day 1 cycle 3 of 12 planned  Her neutrophil nadirs have been as follows Lab Results  Component Value Date   NEUTROABS 1.8 09/16/2020   NEUTROABS 1.4 (L) 09/09/2020   NEUTROABS 1.3 (L) 09/01/2020   NEUTROABS 4.1 08/26/2020   NEUTROABS 2.4 08/19/2020    REVIEW OF SYSTEMS: Jeanne Evans does very well with each treatment but then starts feeling tired day 3 or 4.  She is taking naps occasionally.  She sleeps poorly.  She has started melatonin, takes Ativan at night, and also occasionally takes Tylenol PM.  She feels achy and gets her children to do neck massages for her.  She is gaining weight which is a concern to her.  She remains constipated.  She has had no peripheral neuropathy symptoms.  A detailed review of systems was otherwise stable   COVID 19 VACCINATION STATUS: not vaccinated   HISTORY OF CURRENT ILLNESS: From the original intake note:  Jeanne Evans herself palpated a painful left breast lump. She underwent  bilateral diagnostic mammography with tomography and left breast ultrasonography at [facility] on 05/12/2020 showing: breast density category B; palpable 2.7 cm oval hypoechoic mass in left breast at 2-3 o'clock; 2.2 cm left axillary lymph node/grouping of nodes.  Accordingly on 05/29/2020 she proceeded to biopsy of the left breast area in question. The pathology from this procedure ([id number]) showed: invasive ductal carcinoma, grade 3. Prognostic indicators significant for: estrogen receptor, 10% positive with weak staining intensity and progesterone receptor, 0% negative. Proliferation marker Ki67 at 95%. HER2 equivocal by immunohistochemistry (2+), but negative by fluorescent in situ hybridization with a signals ratio 1.77 and number per cell 2.65.  Biopsy of the left axillary lymph node in question was negative and concordant  The patient's subsequent history is as detailed below.   PAST MEDICAL HISTORY: Past Medical History:  Diagnosis Date  . Breast cancer ([place])   . Family history of ovarian cancer   . Family history of prostate cancer   . History of asthma    has albuterol, uses PRN  . History of heart murmur in childhood   . History of multiple allergies   History of allergy related headaches   PAST SURGICAL HISTORY: Past Surgical History:  Procedure Laterality Date  . PORTACATH PLACEMENT Right 06/18/2020   Procedure: INSERTION PORT-A-CATH WITH ULTRASOUND GUIDANCE;  Surgeon: Erroll Luna, MD;  Location: [facility];  Service: General;  Laterality: Right;  She reports having a boil lanced from her perineal area   FAMILY HISTORY: Family History  Problem Relation Age of Onset  . Lung cancer Maternal Uncle        dx  late 32s  . Prostate cancer Paternal Uncle        dx late 10s  . Ovarian cancer Paternal Grandmother        dx 73s  . Prostate cancer Paternal Grandfather        dx 63s  . Prostate cancer Paternal Uncle        dx early 78s  The  patient's father is 56 and her mother 66, as of 05/2020.  The patient has two brothers and one sister. She reports ovarian cancer in her paternal grandmother, prostate cancer in her paternal grandfather and a paternal uncle, and lung cancer in a maternal uncle.   GYNECOLOGIC HISTORY:  No LMP recorded. (Menstrual status: IUD). Menarche: 34 years old Age at first live birth: 34 years old [place] P 2 LMP current, experiences spotting Contraceptive: Mirena IUD in place HRT n/a  Hysterectomy? no BSO? 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 She describes herself as single. She lives at home with her children, [name] (age 37) and [name] (age 62).     ADVANCED DIRECTIVES: Not in place. She intends to name her mother, Kennadi Albany, as her HCPOA.  [name] lives in [place] and can be reached at [phone].  The patient was given the appropriate documents to complete and notarized at her discretion at the time of her visit 06/04/2020   HEALTH MAINTENANCE: Social History   Tobacco Use  . Smoking status: Never Smoker  . Smokeless tobacco: Never Used  Substance Use Topics  . Alcohol use: Yes    Comment: occas  . Drug use: Yes    Types: Marijuana     Colonoscopy: n/a (age)  PAP: 04/2020  Bone density: n/a (age)   Allergies  Allergen Reactions  . Sulfa Antibiotics Hives    Current Outpatient Medications  Medication Sig Dispense Refill  . docusate sodium (COLACE) 100 MG capsule Take 2 capsules (200 mg total) by mouth daily. 10 capsule 0  . fluconazole (DIFLUCAN) 100 MG tablet Take 1 tablet (100 mg total) by mouth daily. 7 tablet 0  . ibuprofen (ADVIL) 800 MG tablet Take 1 tablet (800 mg total) by mouth every 8 (eight) hours as needed. 30 tablet 0  . levonorgestrel (MIRENA) 20 MCG/24HR IUD 1 each by Intrauterine route once.    . lidocaine-prilocaine (EMLA) cream Apply to  affected area once 30 g 3  . LORazepam (ATIVAN) 0.5 MG tablet Take 1 tablet (0.5 mg total) by mouth at bedtime as needed (Nausea or vomiting). 20 tablet 0  . magic mouthwash SOLN Take 5 mLs by mouth 4 (four) times daily as needed for mouth pain. 240 mL 1  . omeprazole (PRILOSEC) 40 MG capsule Take 1 capsule (40 mg total) by mouth at bedtime. 7 capsule 0  . pegfilgrastim-bmez (ZIEXTENZO) 6 MG/0.6ML injection Inject 0.6 mLs (6 mg total) into the skin every 14 (fourteen) days. 1 Syringe 3  . prochlorperazine (COMPAZINE) 10 MG tablet Take 1 tablet (10 mg total) by mouth every 6 (six) hours as needed (Nausea or vomiting). 30 tablet 1  . venlafaxine XR (EFFEXOR-XR) 75 MG 24 hr capsule Take 1 capsule (75 mg total) by mouth daily with breakfast. 30 capsule 6  . zolmitriptan (ZOMIG) 5 MG tablet Take 1 tablet (5 mg total) by mouth as needed for migraine. 10 tablet 2   No current facility-administered medications for this visit.   Facility-Administered Medications  Ordered in Other Visits  Medication Dose Route Frequency Provider Last Rate Last Admin  . 0.9 %  sodium chloride infusion   Intravenous Once Nelle Sayed, Virgie Dad, MD        OBJECTIVE: African-American woman who appears stated age Vitals:   09/16/20 0948  BP: 131/77  Pulse: 89  Resp: 18  Temp: (!) 97 F (36.1 C)  SpO2: 100%     Body mass index is 29.59 kg/m.   Wt Readings from Last 3 Encounters:  09/16/20 164 lb 6.4 oz (74.6 kg)  09/09/20 162 lb 12.8 oz (73.8 kg)  09/01/20 152 lb 9.6 oz (69.2 kg)  ECOG FS:1 - Symptomatic but completely ambulatory  Sclerae unicteric, EOMs intact Wearing a mask No cervical or supraclavicular adenopathy Lungs no rales or rhonchi Heart regular rate and rhythm Abd soft, nontender, positive bowel sounds MSK no focal spinal tenderness, no upper extremity lymphedema Neuro: nonfocal, well oriented, appropriate affect Breasts: Deferred   LAB RESULTS:  CMP     Component Value Date/Time   NA 139  09/09/2020 0822   K 4.2 09/09/2020 0822   CL 108 09/09/2020 0822   CO2 25 09/09/2020 0822   GLUCOSE 120 (H) 09/09/2020 0822   BUN 10 09/09/2020 0822   CREATININE 0.78 09/09/2020 0822   CREATININE 0.92 06/04/2020 0830   CALCIUM 9.4 09/09/2020 0822   PROT 6.8 09/09/2020 0822   ALBUMIN 3.6 09/09/2020 0822   AST 30 09/09/2020 0822   AST 13 (L) 06/04/2020 0830   ALT 49 (H) 09/09/2020 0822   ALT 14 06/04/2020 0830   ALKPHOS 50 09/09/2020 0822   BILITOT 0.4 09/09/2020 0822   BILITOT 0.5 06/04/2020 0830   GFRNONAA >60 09/09/2020 0822   GFRNONAA >60 06/04/2020 0830   GFRAA >60 08/05/2020 0955   GFRAA >60 06/04/2020 0830    No results found for: TOTALPROTELP, ALBUMINELP, A1GS, A2GS, BETS, BETA2SER, GAMS, MSPIKE, SPEI  Lab Results  Component Value Date   WBC 3.2 (L) 09/16/2020   NEUTROABS 1.8 09/16/2020   HGB 9.0 (L) 09/16/2020   HCT 26.8 (L) 09/16/2020   MCV 92.1 09/16/2020   PLT 216 09/16/2020    No results found for: LABCA2  No components found for: RWERXV400  No results for input(s): INR in the last 168 hours.  No results found for: LABCA2  No results found for: QQP619  No results found for: JKD326  No results found for: ZTI458  No results found for: CA2729  No components found for: HGQUANT  No results found for: CEA1 / No results found for: CEA1   No results found for: AFPTUMOR  No results found for: CHROMOGRNA  No results found for: KPAFRELGTCHN, LAMBDASER, KAPLAMBRATIO (kappa/lambda light chains)  No results found for: HGBA, HGBA2QUANT, HGBFQUANT, HGBSQUAN (Hemoglobinopathy evaluation)   No results found for: LDH  No results found for: IRON, TIBC, IRONPCTSAT (Iron and TIBC)  No results found for: FERRITIN  Urinalysis No results found for: COLORURINE, APPEARANCEUR, LABSPEC, PHURINE, GLUCOSEU, HGBUR, BILIRUBINUR, KETONESUR, PROTEINUR, UROBILINOGEN, NITRITE, LEUKOCYTESUR   STUDIES: No results found.   ELIGIBLE FOR AVAILABLE RESEARCH PROTOCOL:  no  ASSESSMENT: 34 y.o. [place] woman status post left breast upper outer quadrant biopsy 05/29/2020 for a clinical T2 N0, stage IIB invasive ductal carcinoma, functionally triple negative, with an MIB-1 of 95%.  (1) genetics testing 06/23/2020 through the [facility] Multi-Cancer Panel found no deleterious mutations in AIP, ALK, APC, ATM, AXIN2,BAP1,  BARD1, BLM, BMPR1A, BRCA1, BRCA2, BRIP1, CASR, CDC73, CDH1, CDK4, CDKN1B, CDKN1C,  CDKN2A (p14ARF), CDKN2A (p16INK4a), CEBPA, CHEK2, CTNNA1, DICER1, DIS3L2, EGFR (c.2369C>T, p.Thr790Met variant only), EPCAM (Deletion/duplication testing only), FH, FLCN, GATA2, GPC3, GREM1 (Promoter region deletion/duplication testing only), HOXB13 (c.251G>A, p.Gly84Glu), HRAS, KIT, MAX, MEN1, MET, MITF (c.952G>A, p.Glu318Lys variant only), MLH1, MSH2, MSH3, MSH6, MUTYH, NBN, NF1, NF2, NTHL1, PALB2, PDGFRA, PHOX2B, PMS2, POLD1, POLE, POT1, PRKAR1A, PTCH1, PTEN, RAD50, RAD51C, RAD51D, RB1, RECQL4, RET, RNF43, RUNX1, SDHAF2, SDHA (sequence changes only), SDHB, SDHC, SDHD, SMAD4, SMARCA4, SMARCB1, SMARCE1, STK11, SUFU, TERC, TERT, TMEM127, TP53, TSC1, TSC2, VHL, WRN and WT1.   (2) neoadjuvant chemotherapy consisting of cyclophosphamide and doxorubicin in dose dense fashion x4 started 06/19/2020, completed 08/05/2020, followed by paclitaxel and carboplatin weekly x12 starting 08/26/2020  (a) Echocardiogram on 06/13/2020 showed an EF of 60-65%  (3) definitive surgery to follow  (4) postmastectomy radiation as appropriate   PLAN: Jeanne Evans 's neutrophil count continues to improve and she will not need intermittent Neupogen to keep her treatments on schedule.  That is very favorable.  She is generally tolerating treatment well and she has had no peripheral neuropathy symptoms so far.  Today we discussed what to do about fatigue--the more she exercises the less fatigue she will have--and what to do about constipation.  She will increase the vegetables in her diet and make sure to  keep yourself well-hydrated.  We are proceeding with the third of 12 planned treatments today.  She knows to call for any other issue that may develop before the next visit  Total encounter time 25 minutes.Sarajane Jews C. Robert Sunga, MD 09/16/20 9:53 AM Medical Oncology and Hematology [facility] [place], [place] [address] Tel. [phone]    Fax. [phone]   I, Wilburn Mylar, am acting as scribe for Dr. Virgie Dad. Mikolaj Woolstenhulme.  I, Lurline Del MD, have reviewed the above documentation for accuracy and completeness, and I agree with the above.   *Total Encounter Time as defined by the Centers for Medicare and Medicaid Services includes, in addition to the face-to-face time of a patient visit (documented in the note above) non-face-to-face time: obtaining and reviewing outside history, ordering and reviewing medications, tests or procedures, care coordination (communications with other health care professionals or caregivers) and documentation in the medical record.

## 2020-09-16 ENCOUNTER — Other Ambulatory Visit: Payer: Self-pay

## 2020-09-16 ENCOUNTER — Inpatient Hospital Stay: Payer: 59

## 2020-09-16 ENCOUNTER — Inpatient Hospital Stay: Payer: 59 | Attending: Oncology | Admitting: Oncology

## 2020-09-16 VITALS — BP 131/77 | HR 89 | Temp 97.0°F | Resp 18 | Ht 62.5 in | Wt 164.4 lb

## 2020-09-16 DIAGNOSIS — Z79899 Other long term (current) drug therapy: Secondary | ICD-10-CM | POA: Insufficient documentation

## 2020-09-16 DIAGNOSIS — C50412 Malignant neoplasm of upper-outer quadrant of left female breast: Secondary | ICD-10-CM

## 2020-09-16 DIAGNOSIS — Z791 Long term (current) use of non-steroidal anti-inflammatories (NSAID): Secondary | ICD-10-CM | POA: Insufficient documentation

## 2020-09-16 DIAGNOSIS — K5900 Constipation, unspecified: Secondary | ICD-10-CM | POA: Diagnosis not present

## 2020-09-16 DIAGNOSIS — Z170 Estrogen receptor positive status [ER+]: Secondary | ICD-10-CM

## 2020-09-16 DIAGNOSIS — Z793 Long term (current) use of hormonal contraceptives: Secondary | ICD-10-CM | POA: Diagnosis not present

## 2020-09-16 DIAGNOSIS — Z8041 Family history of malignant neoplasm of ovary: Secondary | ICD-10-CM | POA: Diagnosis not present

## 2020-09-16 DIAGNOSIS — Z8042 Family history of malignant neoplasm of prostate: Secondary | ICD-10-CM | POA: Diagnosis not present

## 2020-09-16 DIAGNOSIS — J45909 Unspecified asthma, uncomplicated: Secondary | ICD-10-CM | POA: Diagnosis not present

## 2020-09-16 DIAGNOSIS — Z171 Estrogen receptor negative status [ER-]: Secondary | ICD-10-CM | POA: Diagnosis not present

## 2020-09-16 DIAGNOSIS — Z5111 Encounter for antineoplastic chemotherapy: Secondary | ICD-10-CM | POA: Insufficient documentation

## 2020-09-16 LAB — CBC WITH DIFFERENTIAL/PLATELET
Abs Immature Granulocytes: 0.02 10*3/uL (ref 0.00–0.07)
Basophils Absolute: 0 10*3/uL (ref 0.0–0.1)
Basophils Relative: 1 %
Eosinophils Absolute: 0.1 10*3/uL (ref 0.0–0.5)
Eosinophils Relative: 4 %
HCT: 26.8 % — ABNORMAL LOW (ref 36.0–46.0)
Hemoglobin: 9 g/dL — ABNORMAL LOW (ref 12.0–15.0)
Immature Granulocytes: 1 %
Lymphocytes Relative: 30 %
Lymphs Abs: 1 10*3/uL (ref 0.7–4.0)
MCH: 30.9 pg (ref 26.0–34.0)
MCHC: 33.6 g/dL (ref 30.0–36.0)
MCV: 92.1 fL (ref 80.0–100.0)
Monocytes Absolute: 0.2 10*3/uL (ref 0.1–1.0)
Monocytes Relative: 7 %
Neutro Abs: 1.8 10*3/uL (ref 1.7–7.7)
Neutrophils Relative %: 57 %
Platelets: 216 10*3/uL (ref 150–400)
RBC: 2.91 MIL/uL — ABNORMAL LOW (ref 3.87–5.11)
RDW: 17.6 % — ABNORMAL HIGH (ref 11.5–15.5)
WBC: 3.2 10*3/uL — ABNORMAL LOW (ref 4.0–10.5)
nRBC: 0 % (ref 0.0–0.2)

## 2020-09-16 LAB — COMPREHENSIVE METABOLIC PANEL
ALT: 79 U/L — ABNORMAL HIGH (ref 0–44)
AST: 40 U/L (ref 15–41)
Albumin: 3.6 g/dL (ref 3.5–5.0)
Alkaline Phosphatase: 54 U/L (ref 38–126)
Anion gap: 9 (ref 5–15)
BUN: 12 mg/dL (ref 6–20)
CO2: 23 mmol/L (ref 22–32)
Calcium: 9 mg/dL (ref 8.9–10.3)
Chloride: 108 mmol/L (ref 98–111)
Creatinine, Ser: 0.72 mg/dL (ref 0.44–1.00)
GFR, Estimated: >60 mL/min (ref >60)
Glucose, Bld: 122 mg/dL — ABNORMAL HIGH (ref 70–99)
Potassium: 4.1 mmol/L (ref 3.5–5.1)
Sodium: 140 mmol/L (ref 135–145)
Total Bilirubin: 0.4 mg/dL (ref 0.3–1.2)
Total Protein: 7 g/dL (ref 6.5–8.1)

## 2020-09-16 MED ORDER — DIPHENHYDRAMINE HCL 50 MG/ML IJ SOLN
INTRAMUSCULAR | Status: AC
  Filled 2020-09-16: qty 1

## 2020-09-16 MED ORDER — SODIUM CHLORIDE 0.9 % IV SOLN
80.0000 mg/m2 | Freq: Once | INTRAVENOUS | Status: AC
  Administered 2020-09-16: 144 mg via INTRAVENOUS
  Filled 2020-09-16: qty 24

## 2020-09-16 MED ORDER — DIPHENHYDRAMINE HCL 50 MG/ML IJ SOLN
25.0000 mg | Freq: Once | INTRAMUSCULAR | Status: AC
  Administered 2020-09-16: 25 mg via INTRAVENOUS

## 2020-09-16 MED ORDER — FAMOTIDINE IN NACL 20-0.9 MG/50ML-% IV SOLN
20.0000 mg | Freq: Once | INTRAVENOUS | Status: AC
  Administered 2020-09-16: 20 mg via INTRAVENOUS

## 2020-09-16 MED ORDER — SODIUM CHLORIDE 0.9 % IV SOLN
16.0000 mg | Freq: Once | INTRAVENOUS | Status: AC
  Administered 2020-09-16: 16 mg via INTRAVENOUS
  Filled 2020-09-16: qty 8

## 2020-09-16 MED ORDER — SODIUM CHLORIDE 0.9 % IV SOLN
Freq: Once | INTRAVENOUS | Status: AC
  Filled 2020-09-16: qty 250

## 2020-09-16 MED ORDER — SODIUM CHLORIDE 0.9% FLUSH
10.0000 mL | INTRAVENOUS | Status: DC | PRN
  Administered 2020-09-16: 10 mL
  Filled 2020-09-16: qty 10

## 2020-09-16 MED ORDER — SODIUM CHLORIDE 0.9 % IV SOLN
273.4000 mg | Freq: Once | INTRAVENOUS | Status: AC
  Administered 2020-09-16: 270 mg via INTRAVENOUS
  Filled 2020-09-16: qty 27

## 2020-09-16 MED ORDER — SODIUM CHLORIDE 0.9 % IV SOLN
Freq: Once | INTRAVENOUS | Status: DC
  Filled 2020-09-16: qty 250

## 2020-09-16 MED ORDER — SODIUM CHLORIDE 0.9% FLUSH
10.0000 mL | Freq: Once | INTRAVENOUS | Status: AC
  Administered 2020-09-16: 10 mL
  Filled 2020-09-16: qty 10

## 2020-09-16 MED ORDER — FAMOTIDINE IN NACL 20-0.9 MG/50ML-% IV SOLN
INTRAVENOUS | Status: AC
  Filled 2020-09-16: qty 50

## 2020-09-16 MED ORDER — SODIUM CHLORIDE 0.9 % IV SOLN
10.0000 mg | Freq: Once | INTRAVENOUS | Status: AC
  Administered 2020-09-16: 10 mg via INTRAVENOUS
  Filled 2020-09-16: qty 10

## 2020-09-16 MED ORDER — HEPARIN SOD (PORK) LOCK FLUSH 100 UNIT/ML IV SOLN
500.0000 [IU] | Freq: Once | INTRAVENOUS | Status: AC | PRN
  Administered 2020-09-16: 500 [IU]
  Filled 2020-09-16: qty 5

## 2020-09-16 NOTE — Patient Instructions (Signed)
   [facility] Discharge Instructions for Patients Receiving Chemotherapy  Today you received the following chemotherapy agents Taxol and Carboplatin   To help prevent nausea and vomiting after your treatment, we encourage you to take your nausea medication as directed.    If you develop nausea and vomiting that is not controlled by your nausea medication, call the clinic.   BELOW ARE SYMPTOMS THAT SHOULD BE REPORTED IMMEDIATELY:  *FEVER GREATER THAN 100.5 F  *CHILLS WITH OR WITHOUT FEVER  NAUSEA AND VOMITING THAT IS NOT CONTROLLED WITH YOUR NAUSEA MEDICATION  *UNUSUAL SHORTNESS OF BREATH  *UNUSUAL BRUISING OR BLEEDING  TENDERNESS IN MOUTH AND THROAT WITH OR WITHOUT PRESENCE OF ULCERS  *URINARY PROBLEMS  *BOWEL PROBLEMS  UNUSUAL RASH Items with * indicate a potential emergency and should be followed up as soon as possible.  Feel free to call the clinic should you have any questions or concerns. The clinic phone number is [phone].  Please show the CHEMO ALERT CARD at check-in to the Emergency Department and triage nurse.   

## 2020-09-16 NOTE — Patient Instructions (Signed)

## 2020-09-19 ENCOUNTER — Telehealth: Payer: Self-pay | Admitting: Oncology

## 2020-09-19 NOTE — Telephone Encounter (Signed)
Scheduled appt per 11/2 los - pt to get an updated schedule next visit.

## 2020-09-23 ENCOUNTER — Inpatient Hospital Stay: Payer: 59

## 2020-09-23 ENCOUNTER — Other Ambulatory Visit: Payer: Self-pay

## 2020-09-23 VITALS — BP 135/80 | HR 89 | Temp 98.5°F | Resp 18 | Ht 62.5 in | Wt 165.0 lb

## 2020-09-23 DIAGNOSIS — C50412 Malignant neoplasm of upper-outer quadrant of left female breast: Secondary | ICD-10-CM

## 2020-09-23 DIAGNOSIS — Z5111 Encounter for antineoplastic chemotherapy: Secondary | ICD-10-CM | POA: Diagnosis not present

## 2020-09-23 DIAGNOSIS — Z170 Estrogen receptor positive status [ER+]: Secondary | ICD-10-CM

## 2020-09-23 LAB — COMPREHENSIVE METABOLIC PANEL
ALT: 51 U/L — ABNORMAL HIGH (ref 0–44)
AST: 24 U/L (ref 15–41)
Albumin: 3.8 g/dL (ref 3.5–5.0)
Alkaline Phosphatase: 47 U/L (ref 38–126)
Anion gap: 4 — ABNORMAL LOW (ref 5–15)
BUN: 10 mg/dL (ref 6–20)
CO2: 29 mmol/L (ref 22–32)
Calcium: 9.3 mg/dL (ref 8.9–10.3)
Chloride: 104 mmol/L (ref 98–111)
Creatinine, Ser: 0.76 mg/dL (ref 0.44–1.00)
GFR, Estimated: >60 mL/min (ref >60)
Glucose, Bld: 150 mg/dL — ABNORMAL HIGH (ref 70–99)
Potassium: 3.8 mmol/L (ref 3.5–5.1)
Sodium: 137 mmol/L (ref 135–145)
Total Bilirubin: 0.7 mg/dL (ref 0.3–1.2)
Total Protein: 7.1 g/dL (ref 6.5–8.1)

## 2020-09-23 LAB — CBC WITH DIFFERENTIAL/PLATELET
Abs Immature Granulocytes: 0.01 10*3/uL (ref 0.00–0.07)
Basophils Absolute: 0 10*3/uL (ref 0.0–0.1)
Basophils Relative: 1 %
Eosinophils Absolute: 0 10*3/uL (ref 0.0–0.5)
Eosinophils Relative: 2 %
HCT: 27 % — ABNORMAL LOW (ref 36.0–46.0)
Hemoglobin: 8.9 g/dL — ABNORMAL LOW (ref 12.0–15.0)
Immature Granulocytes: 1 %
Lymphocytes Relative: 25 %
Lymphs Abs: 0.5 10*3/uL — ABNORMAL LOW (ref 0.7–4.0)
MCH: 30.2 pg (ref 26.0–34.0)
MCHC: 33 g/dL (ref 30.0–36.0)
MCV: 91.5 fL (ref 80.0–100.0)
Monocytes Absolute: 0.1 10*3/uL (ref 0.1–1.0)
Monocytes Relative: 6 %
Neutro Abs: 1.3 10*3/uL — ABNORMAL LOW (ref 1.7–7.7)
Neutrophils Relative %: 65 %
Platelets: 135 10*3/uL — ABNORMAL LOW (ref 150–400)
RBC: 2.95 MIL/uL — ABNORMAL LOW (ref 3.87–5.11)
RDW: 17.2 % — ABNORMAL HIGH (ref 11.5–15.5)
WBC: 2 10*3/uL — ABNORMAL LOW (ref 4.0–10.5)
nRBC: 0 % (ref 0.0–0.2)

## 2020-09-23 MED ORDER — SODIUM CHLORIDE 0.9% FLUSH
10.0000 mL | Freq: Once | INTRAVENOUS | Status: AC
  Administered 2020-09-23: 10 mL
  Filled 2020-09-23: qty 10

## 2020-09-23 MED ORDER — SODIUM CHLORIDE 0.9 % IV SOLN
80.0000 mg/m2 | Freq: Once | INTRAVENOUS | Status: AC
  Administered 2020-09-23: 144 mg via INTRAVENOUS
  Filled 2020-09-23: qty 24

## 2020-09-23 MED ORDER — SODIUM CHLORIDE 0.9 % IV SOLN
16.0000 mg | Freq: Once | INTRAVENOUS | Status: AC
  Administered 2020-09-23: 16 mg via INTRAVENOUS
  Filled 2020-09-23: qty 8

## 2020-09-23 MED ORDER — SODIUM CHLORIDE 0.9% FLUSH
10.0000 mL | INTRAVENOUS | Status: DC | PRN
  Administered 2020-09-23: 10 mL
  Filled 2020-09-23: qty 10

## 2020-09-23 MED ORDER — HEPARIN SOD (PORK) LOCK FLUSH 100 UNIT/ML IV SOLN
500.0000 [IU] | Freq: Once | INTRAVENOUS | Status: AC | PRN
  Administered 2020-09-23: 500 [IU]
  Filled 2020-09-23: qty 5

## 2020-09-23 MED ORDER — SODIUM CHLORIDE 0.9 % IV SOLN
Freq: Once | INTRAVENOUS | Status: AC
  Filled 2020-09-23: qty 250

## 2020-09-23 MED ORDER — FAMOTIDINE IN NACL 20-0.9 MG/50ML-% IV SOLN
20.0000 mg | Freq: Once | INTRAVENOUS | Status: AC
  Administered 2020-09-23: 20 mg via INTRAVENOUS

## 2020-09-23 MED ORDER — FAMOTIDINE IN NACL 20-0.9 MG/50ML-% IV SOLN
INTRAVENOUS | Status: AC
  Filled 2020-09-23: qty 50

## 2020-09-23 MED ORDER — SODIUM CHLORIDE 0.9 % IV SOLN
10.0000 mg | Freq: Once | INTRAVENOUS | Status: AC
  Administered 2020-09-23: 10 mg via INTRAVENOUS
  Filled 2020-09-23: qty 10

## 2020-09-23 MED ORDER — DIPHENHYDRAMINE HCL 50 MG/ML IJ SOLN
25.0000 mg | Freq: Once | INTRAMUSCULAR | Status: AC
  Administered 2020-09-23: 25 mg via INTRAVENOUS

## 2020-09-23 MED ORDER — SODIUM CHLORIDE 0.9 % IV SOLN
273.4000 mg | Freq: Once | INTRAVENOUS | Status: AC
  Administered 2020-09-23: 270 mg via INTRAVENOUS
  Filled 2020-09-23: qty 27

## 2020-09-23 MED ORDER — DIPHENHYDRAMINE HCL 50 MG/ML IJ SOLN
INTRAMUSCULAR | Status: AC
  Filled 2020-09-23: qty 1

## 2020-09-23 NOTE — Patient Instructions (Signed)

## 2020-09-23 NOTE — Patient Instructions (Signed)
[facility] Discharge Instructions for Patients Receiving Chemotherapy  Today you received the following chemotherapy agents Paclitaxel (TAXOL) & Carboplatin (PARAPLATIN).  To help prevent nausea and vomiting after your treatment, we encourage you to take your nausea medication as prescribed.  If you develop nausea and vomiting that is not controlled by your nausea medication, call the clinic.   BELOW ARE SYMPTOMS THAT SHOULD BE REPORTED IMMEDIATELY:  *FEVER GREATER THAN 100.5 F  *CHILLS WITH OR WITHOUT FEVER  NAUSEA AND VOMITING THAT IS NOT CONTROLLED WITH YOUR NAUSEA MEDICATION  *UNUSUAL SHORTNESS OF BREATH  *UNUSUAL BRUISING OR BLEEDING  TENDERNESS IN MOUTH AND THROAT WITH OR WITHOUT PRESENCE OF ULCERS  *URINARY PROBLEMS  *BOWEL PROBLEMS  UNUSUAL RASH Items with * indicate a potential emergency and should be followed up as soon as possible.  Feel free to call the clinic should you have any questions or concerns. The clinic phone number is [phone].  Please show the CHEMO ALERT CARD at check-in to the Emergency Department and triage nurse.   

## 2020-09-23 NOTE — Progress Notes (Signed)
Per Dr. Magrinat, ok to treat with ANC 1.3 

## 2020-09-29 MED FILL — Dexamethasone Sodium Phosphate Inj 100 MG/10ML: INTRAMUSCULAR | Qty: 1 | Status: AC

## 2020-09-29 NOTE — Progress Notes (Signed)
[place] OFFICE PROGRESS NOTE  [name], MD [address] [place] [address]  DIAGNOSIS: Functionally triple negative breast cancer  CURRENT THERAPY: Neoadjuvant chemotherapy. Today she is scheduled for day 1 of cycle #5 of 12 planned weekly carboplatin/paclitaxel doses.   INTERVAL HISTORY: Jeanne Evans 34 y.o. female returns to the clinic today for a follow-up visit.  The patient is feeling fairly well today without any concerning complaints except for new onset numbness and tingling in the palm of her right hand and left sole of her foot which started 1 week ago. She states this is constant and feels like her foot is asleep. It has not interfered with her ability to walk or drive. She also reports that her mouth has been dry lately. The tip of her tongue feels like it has been "burned" or "raw". She denies any ulcerations. The patient is currently undergoing weekly carboplatin and paclitaxel.  She is scheduled to have cycle #5 of the total 12 planned doses.  Today, she denies any fever, chills, or weight loss.  She denies any signs and symptoms of infection including nasal congestion, dysuria, or skin infections.  She denies any vomiting, or diarrhea. Her nausea is controlled with her anti-emetic. She has some constipation for which she takes miralax. She also is struggling with insomnia. She tried melatonin and tylenol PM without significant relief. She has ativan which helps and she is requesting a refill today. She is here for evaluation before starting cycle #5 of treatment.    HISTORY OF CURRENT ILLNESS: From the original intake note:  Jeanne Evans herself palpated a painful left breast lump. She underwent bilateral diagnostic mammography with tomography and left breast ultrasonography at [facility] on 05/12/2020 showing: breast density category B; palpable 2.7 cm oval hypoechoic mass in left breast at 2-3 o'clock; 2.2 cm left  axillary lymph node/grouping of nodes.  Accordingly on 05/29/2020 she proceeded to biopsy of the left breast area in question. The pathology from this procedure ([id number]) showed: invasive ductal carcinoma, grade 3. Prognostic indicators significant for: estrogen receptor, 10% positive with weak staining intensity and progesterone receptor, 0% negative. Proliferation marker Ki67 at 95%. HER2 equivocal by immunohistochemistry (2+), but negative by fluorescent in situ hybridization with a signals ratio 1.77 and number per cell 2.65.  Biopsy of the left axillary lymph node in question was negative and concordant  The patient's subsequent history is as detailed below.   MEDICAL HISTORY: Past Medical History:  Diagnosis Date  . Breast cancer ([place])   . Family history of ovarian cancer   . Family history of prostate cancer   . History of asthma    has albuterol, uses PRN  . History of heart murmur in childhood   . History of multiple allergies     ALLERGIES:  is allergic to sulfa antibiotics.  MEDICATIONS:  Current Outpatient Medications  Medication Sig Dispense Refill  . docusate sodium (COLACE) 100 MG capsule Take 2 capsules (200 mg total) by mouth daily. 10 capsule 0  . fluconazole (DIFLUCAN) 100 MG tablet Take 1 tablet (100 mg total) by mouth daily. 7 tablet 0  . ibuprofen (ADVIL) 800 MG tablet Take 1 tablet (800 mg total) by mouth every 8 (eight) hours as needed. 30 tablet 0  . levonorgestrel (MIRENA) 20 MCG/24HR IUD 1 each by Intrauterine route once.    . lidocaine-prilocaine (EMLA) cream Apply to affected area once 30 g 3  . LORazepam (ATIVAN) 0.5  MG tablet Take 1 tablet (0.5 mg total) by mouth at bedtime as needed (Nausea or vomiting). 20 tablet 0  . magic mouthwash SOLN Take 5 mLs by mouth 4 (four) times daily as needed for mouth pain. 240 mL 1  . omeprazole (PRILOSEC) 40 MG capsule Take 1 capsule (40 mg total) by mouth at bedtime. 7 capsule 0  . pegfilgrastim-bmez (ZIEXTENZO)  6 MG/0.6ML injection Inject 0.6 mLs (6 mg total) into the skin every 14 (fourteen) days. 1 Syringe 3  . polyethylene glycol powder (GLYCOLAX/MIRALAX) 17 GM/SCOOP powder Take by mouth.    . prochlorperazine (COMPAZINE) 10 MG tablet Take 1 tablet (10 mg total) by mouth every 6 (six) hours as needed (Nausea or vomiting). 30 tablet 1  . venlafaxine XR (EFFEXOR-XR) 75 MG 24 hr capsule Take 1 capsule (75 mg total) by mouth daily with breakfast. 30 capsule 6  . zolmitriptan (ZOMIG) 5 MG tablet Take 1 tablet (5 mg total) by mouth as needed for migraine. 10 tablet 2   No current facility-administered medications for this visit.    SURGICAL HISTORY:  Past Surgical History:  Procedure Laterality Date  . PORTACATH PLACEMENT Right 06/18/2020   Procedure: INSERTION PORT-A-CATH WITH ULTRASOUND GUIDANCE;  Surgeon: Erroll Luna, MD;  Location: [facility];  Service: General;  Laterality: Right;    REVIEW OF SYSTEMS:   Review of Systems  Constitutional: Negative for appetite change, chills, fatigue, fever and unexpected weight change.  HENT: Negative for mouth sores, nosebleeds, sore throat and trouble swallowing.   Eyes: Negative for eye problems and icterus.  Respiratory: Negative for cough, hemoptysis, shortness of breath and wheezing.   Cardiovascular: Negative for chest pain and leg swelling.  Gastrointestinal: Positive for mild nausea. Negative for abdominal pain, constipation, diarrhea,  and vomiting.  Genitourinary: Negative for bladder incontinence, difficulty urinating, dysuria, frequency and hematuria.   Musculoskeletal: Negative for back pain, gait problem, neck pain and neck stiffness.  Skin: Negative for itching and rash.  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 Negative for dizziness, extremity weakness, gait problem, headaches, light-headedness and seizures.  Hematological: Negative for adenopathy. Does not bruise/bleed easily.   Psychiatric/Behavioral: Negative for confusion, depression and sleep disturbance. The patient is not nervous/anxious.     PHYSICAL EXAMINATION:  Blood pressure (!) 149/84, pulse 89, temperature 98.5 F (36.9 C), temperature source Tympanic, resp. rate 20, height '5\' 2"'  (1.575 m), weight 165 lb 14.4 oz (75.3 kg), SpO2 100 %.  ECOG PERFORMANCE STATUS: 1 - Symptomatic but completely ambulatory  Physical Exam  Constitutional: Oriented to person, place, and time and well-developed, well-nourished, and in no distress.  HENT:  Head: Normocephalic and atraumatic.  Mouth/Throat: Oropharynx is clear and moist. No oropharyngeal exudate.  Eyes: Conjunctivae are normal. Right eye exhibits no discharge. Left eye exhibits no discharge. No scleral icterus.  Neck: Normal range of motion. Neck supple.  Cardiovascular: Normal rate, regular rhythm, normal heart sounds and intact distal pulses.   Pulmonary/Chest: Effort normal and breath sounds normal. No respiratory distress. No wheezes. No rales.  Abdominal: Soft. Bowel sounds are normal. Exhibits no distension and no mass. There is no tenderness.  Musculoskeletal: Normal range of motion. Exhibits no edema.  Lymphadenopathy:    No cervical adenopathy.  Neurological: Alert and oriented to person, place, and time. Exhibits normal muscle tone. Gait normal. Coordination normal.  Skin: Skin is warm and dry. No rash noted. Not diaphoretic. No erythema. No pallor.  Psychiatric: Mood, memory and judgment normal.  Vitals reviewed.  LABORATORY DATA: Lab Results  Component Value Date   WBC 1.3 (L) 09/30/2020   HGB 8.6 (L) 09/30/2020   HCT 25.9 (L) 09/30/2020   MCV 93.8 09/30/2020   PLT 217 09/30/2020      Chemistry      Component Value Date/Time   NA 140 09/30/2020 1120   K 3.9 09/30/2020 1120   CL 108 09/30/2020 1120   CO2 23 09/30/2020 1120   BUN 12 09/30/2020 1120   CREATININE 0.75 09/30/2020 1120   CREATININE 0.92 06/04/2020 0830      Component  Value Date/Time   CALCIUM 9.1 09/30/2020 1120   ALKPHOS 52 09/30/2020 1120   AST 19 09/30/2020 1120   AST 13 (L) 06/04/2020 0830   ALT 31 09/30/2020 1120   ALT 14 06/04/2020 0830   BILITOT 0.5 09/30/2020 1120   BILITOT 0.5 06/04/2020 0830       RADIOGRAPHIC STUDIES:  No results found.   ASSESSMENT/PLAN:  ELIGIBLE FOR AVAILABLE RESEARCH PROTOCOL: no  ASSESSMENT: 34 y.o. [place] woman status post left breast upper outer quadrant biopsy 05/29/2020 for a clinical T2 N0, stage IIB invasive ductal carcinoma, functionally triple negative, with an MIB-1 of 95%.  (1) genetics testing 06/23/2020 through the [facility] Multi-Cancer Panel found no deleterious mutations in AIP, ALK, APC, ATM, AXIN2,BAP1, BARD1, BLM, BMPR1A, BRCA1, BRCA2, BRIP1, CASR, CDC73, CDH1, CDK4, CDKN1B, CDKN1C, CDKN2A (p14ARF), CDKN2A (p16INK4a), CEBPA, CHEK2, CTNNA1, DICER1, DIS3L2, EGFR (c.2369C>T, p.Thr790Met variant only), EPCAM (Deletion/duplication testing only), FH, FLCN, GATA2, GPC3, GREM1 (Promoter region deletion/duplication testing only), HOXB13 (c.251G>A, p.Gly84Glu), HRAS, KIT, MAX, MEN1, MET, MITF (c.952G>A, p.Glu318Lys variant only), MLH1, MSH2, MSH3, MSH6, MUTYH, NBN, NF1, NF2, NTHL1, PALB2, PDGFRA, PHOX2B, PMS2, POLD1, POLE, POT1, PRKAR1A, PTCH1, PTEN, RAD50, RAD51C, RAD51D, RB1, RECQL4, RET, RNF43, RUNX1, SDHAF2, SDHA (sequence changes only), SDHB, SDHC, SDHD, SMAD4, SMARCA4, SMARCB1, SMARCE1, STK11, SUFU, TERC, TERT, TMEM127, TP53, TSC1, TSC2, VHL, WRN and WT1.   (2) neoadjuvant chemotherapy consisting of cyclophosphamide and doxorubicin in dose dense fashion x4 started 06/19/2020, completed 08/05/2020, followed by paclitaxel and carboplatin weekly x12 starting 08/26/2020             (a) Echocardiogram on 06/13/2020 showed an EF of 60-65%  (3) definitive surgery to follow  (4) postmastectomy radiation as appropriate  Plan: Jailyn is feeling fair today except she developed numbness in tingling in  her left foot and right hand over the past week. Reviewed her symptoms and labs with Dr. Jana Hakim. We will hold her treatment this week. Her ANC is 0.6 today. Neutropenic precautions reviewed with the patient.   She will follow up with Dr. Jana Hakim next week who will re-evaluate her neuropathy and perform repeat labs before making the decision regarding resuming her treatment vs. Discontinuing it at this point.   I have refilled her ativan today.  For her dry mouth, the patient was advised to increase her hydration. She can also use biotene rinses.   The patient was advised to call immediately if she has any concerning symptoms in the interval. The patient voices understanding of current disease status and treatment options and is in agreement with the current care plan. All questions were answered. The patient knows to call the clinic with any problems, questions or concerns. We can certainly see the patient much sooner if necessary   No orders of the defined types were placed in this encounter.    Colden Samaras L Ezio Wieck, PA-C 09/30/20

## 2020-09-30 ENCOUNTER — Inpatient Hospital Stay: Payer: 59

## 2020-09-30 ENCOUNTER — Inpatient Hospital Stay (HOSPITAL_BASED_OUTPATIENT_CLINIC_OR_DEPARTMENT_OTHER): Payer: 59 | Admitting: Physician Assistant

## 2020-09-30 ENCOUNTER — Other Ambulatory Visit: Payer: Self-pay

## 2020-09-30 DIAGNOSIS — C50412 Malignant neoplasm of upper-outer quadrant of left female breast: Secondary | ICD-10-CM | POA: Diagnosis not present

## 2020-09-30 DIAGNOSIS — Z95828 Presence of other vascular implants and grafts: Secondary | ICD-10-CM

## 2020-09-30 DIAGNOSIS — Z170 Estrogen receptor positive status [ER+]: Secondary | ICD-10-CM

## 2020-09-30 DIAGNOSIS — Z5111 Encounter for antineoplastic chemotherapy: Secondary | ICD-10-CM | POA: Diagnosis not present

## 2020-09-30 DIAGNOSIS — D702 Other drug-induced agranulocytosis: Secondary | ICD-10-CM | POA: Diagnosis not present

## 2020-09-30 LAB — COMPREHENSIVE METABOLIC PANEL
ALT: 31 U/L (ref 0–44)
AST: 19 U/L (ref 15–41)
Albumin: 3.7 g/dL (ref 3.5–5.0)
Alkaline Phosphatase: 52 U/L (ref 38–126)
Anion gap: 9 (ref 5–15)
BUN: 12 mg/dL (ref 6–20)
CO2: 23 mmol/L (ref 22–32)
Calcium: 9.1 mg/dL (ref 8.9–10.3)
Chloride: 108 mmol/L (ref 98–111)
Creatinine, Ser: 0.75 mg/dL (ref 0.44–1.00)
GFR, Estimated: >60 mL/min (ref >60)
Glucose, Bld: 112 mg/dL — ABNORMAL HIGH (ref 70–99)
Potassium: 3.9 mmol/L (ref 3.5–5.1)
Sodium: 140 mmol/L (ref 135–145)
Total Bilirubin: 0.5 mg/dL (ref 0.3–1.2)
Total Protein: 7 g/dL (ref 6.5–8.1)

## 2020-09-30 LAB — CBC WITH DIFFERENTIAL/PLATELET
Abs Immature Granulocytes: 0 10*3/uL (ref 0.00–0.07)
Basophils Absolute: 0 10*3/uL (ref 0.0–0.1)
Basophils Relative: 1 %
Eosinophils Absolute: 0 10*3/uL (ref 0.0–0.5)
Eosinophils Relative: 1 %
HCT: 25.9 % — ABNORMAL LOW (ref 36.0–46.0)
Hemoglobin: 8.6 g/dL — ABNORMAL LOW (ref 12.0–15.0)
Immature Granulocytes: 0 %
Lymphocytes Relative: 46 %
Lymphs Abs: 0.6 10*3/uL — ABNORMAL LOW (ref 0.7–4.0)
MCH: 31.2 pg (ref 26.0–34.0)
MCHC: 33.2 g/dL (ref 30.0–36.0)
MCV: 93.8 fL (ref 80.0–100.0)
Monocytes Absolute: 0.1 10*3/uL (ref 0.1–1.0)
Monocytes Relative: 8 %
Neutro Abs: 0.6 10*3/uL — ABNORMAL LOW (ref 1.7–7.7)
Neutrophils Relative %: 44 %
Platelets: 217 10*3/uL (ref 150–400)
RBC: 2.76 MIL/uL — ABNORMAL LOW (ref 3.87–5.11)
RDW: 17.9 % — ABNORMAL HIGH (ref 11.5–15.5)
WBC: 1.3 10*3/uL — ABNORMAL LOW (ref 4.0–10.5)
nRBC: 0 % (ref 0.0–0.2)

## 2020-09-30 MED ORDER — HEPARIN SOD (PORK) LOCK FLUSH 100 UNIT/ML IV SOLN
500.0000 [IU] | Freq: Once | INTRAVENOUS | Status: AC
  Administered 2020-09-30: 500 [IU] via INTRAVENOUS
  Filled 2020-09-30: qty 5

## 2020-09-30 MED ORDER — SODIUM CHLORIDE 0.9% FLUSH
10.0000 mL | Freq: Once | INTRAVENOUS | Status: AC
  Administered 2020-09-30: 10 mL
  Filled 2020-09-30: qty 10

## 2020-09-30 MED ORDER — SODIUM CHLORIDE 0.9% FLUSH
10.0000 mL | Freq: Once | INTRAVENOUS | Status: AC
  Administered 2020-09-30: 10 mL via INTRAVENOUS
  Filled 2020-09-30: qty 10

## 2020-09-30 MED ORDER — LORAZEPAM 0.5 MG PO TABS: 0.5000 mg | ORAL_TABLET | Freq: Every evening | ORAL | 0 refills | Status: DC | PRN

## 2020-09-30 NOTE — Patient Instructions (Signed)

## 2020-09-30 NOTE — Progress Notes (Signed)
Per Cassie, PA: No treatment today, deaccess port-a-cart and discharge patient.

## 2020-10-06 MED FILL — Dexamethasone Sodium Phosphate Inj 100 MG/10ML: INTRAMUSCULAR | Qty: 1 | Status: AC

## 2020-10-06 NOTE — Progress Notes (Signed)
Jeanne Evans  [phone] Fax:([phone]     ID: Jeanne Evans DOB: 08-Aug-1986  MR#: [id number]  EHO#:[id number]  Patient Care Team: [name], MD as PCP - General (Obstetrics and Gynecology) Rockwell Germany, RN as Oncology Nurse Navigator Mauro Kaufmann, RN as Oncology Nurse Navigator Erroll Luna, MD as Consulting Physician (General Surgery) Henrietta Cieslewicz, Virgie Dad, MD as Consulting Physician (Oncology) Kyung Rudd, MD as Consulting Physician (Radiation Oncology) Servando Salina, MD as Consulting Physician (Obstetrics and Gynecology) Chauncey Cruel, MD OTHER MD:  CHIEF COMPLAINT: 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    She completed 4 cycles of Cytoxan and doxorubicin and started weekly carboplatin/paclitaxel on 08/26/2020.  Today is week 5 of 12 planned  Note that treatment was postponed last week because she had developed some numbness and tingling in her left foot on the right hand also her ANC was only 0.6. Today the numbness and tingling in the left foot is a little bit more intermittent but has not completely resolved. The right hand continues to have some numbness. The right foot and left hand really have no symptoms.  In addition her [place] today is again low at 0.4  REVIEW OF SYSTEMS: Jeanne Evans is feeling very tired.  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  She tried to go walking but she was just exhausted all the time she has had no fevers rash bleeding or pruritus.  There has been no change in bowel or bladder habits.  She denies cough phlegm production or pleurisy.  A detailed review of systems was otherwise stable.   COVID 19 VACCINATION STATUS: not vaccinated as of November 2021   HISTORY OF CURRENT ILLNESS: From  the original intake note:  Jeanne Evans herself palpated a painful left breast lump. She underwent bilateral diagnostic mammography with tomography and left breast ultrasonography at [facility] on 05/12/2020 showing: breast density category B; palpable 2.7 cm oval hypoechoic mass in left breast at 2-3 o'clock; 2.2 cm left axillary lymph node/grouping of nodes.  Accordingly on 05/29/2020 she proceeded to biopsy of the left breast area in question. The pathology from this procedure ([id number]) showed: invasive ductal carcinoma, grade 3. Prognostic indicators significant for: estrogen receptor, 10% positive with weak staining intensity and progesterone receptor, 0% negative. Proliferation marker Ki67 at 95%. HER2 equivocal by immunohistochemistry (2+), but negative by fluorescent in situ hybridization with a signals ratio 1.77 and number per cell 2.65.  Biopsy of the left axillary lymph node in question was negative and concordant  The patient's subsequent history is as detailed below.   PAST MEDICAL HISTORY: Past Medical History:  Diagnosis Date  . Breast cancer ([place])   . Family history of ovarian cancer   . Family history of prostate cancer   . History of asthma    has albuterol, uses PRN  . History of heart murmur in childhood   . History of multiple allergies   History of allergy related headaches   PAST SURGICAL HISTORY: Past Surgical History:  Procedure Laterality Date  . PORTACATH PLACEMENT Right 06/18/2020   Procedure: INSERTION PORT-A-CATH WITH ULTRASOUND GUIDANCE;  Surgeon: Erroll Luna, MD;  Location: [facility];  Service: General;  Laterality: Right;  She reports having a boil lanced from her perineal area  FAMILY HISTORY: Family History  Problem Relation Age of Onset  . Lung cancer Maternal Uncle        dx late 53s  . Prostate cancer Paternal Uncle        dx late 55s  . Ovarian cancer Paternal Grandmother        dx 51s  . Prostate  cancer Paternal Grandfather        dx 63s  . Prostate cancer Paternal Uncle        dx early 75s  The patient's father is 61 and her mother 45, as of 05/2020.  The patient has two brothers and one sister. She reports ovarian cancer in her paternal grandmother, prostate cancer in her paternal grandfather and a paternal uncle, and lung cancer in a maternal uncle.   GYNECOLOGIC HISTORY:  No LMP recorded. (Menstrual status: IUD). Menarche: 34 years old Age at first live birth: 34 years old [place] P 2 LMP current, experiences spotting Contraceptive: Mirena IUD in place HRT n/a  Hysterectomy? no BSO? 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 She describes herself as single. She lives at home with her children, Jeanne Evans (age 66) and Jeanne Evans (age 80).  She is originally from [place] her home.    ADVANCED DIRECTIVES: Not in place. She intends to name her mother, Jeanne Evans, as her HCPOA.  Jeanne Evans lives in [place] and can be reached at [phone].  The patient was given the appropriate documents to complete and notarized at her discretion at the time of her visit 06/04/2020   HEALTH MAINTENANCE: Social History   Tobacco Use  . Smoking status: Never Smoker  . Smokeless tobacco: Never Used  Substance Use Topics  . Alcohol use: Yes    Comment: occas  . Drug use: Yes    Types: Marijuana     Colonoscopy: n/a (age)  PAP: 04/2020  Bone density: n/a (age)   Allergies  Allergen Reactions  . Sulfa Antibiotics Hives    Current Outpatient Medications  Medication Sig Dispense Refill  . docusate sodium (COLACE) 100 MG capsule Take 2 capsules (200 mg total) by mouth daily. 10 capsule 0  . fluconazole (DIFLUCAN) 100 MG tablet Take 1 tablet (100 mg total) by mouth daily. 7 tablet 0  . ibuprofen (ADVIL) 800 MG tablet Take 1 tablet (800 mg total) by mouth every  8 (eight) hours as needed. 30 tablet 0  . levonorgestrel (MIRENA) 20 MCG/24HR IUD 1 each by Intrauterine route once.    . lidocaine-prilocaine (EMLA) cream Apply to affected area once 30 g 3  . LORazepam (ATIVAN) 0.5 MG tablet Take 1 tablet (0.5 mg total) by mouth at bedtime as needed (Nausea or vomiting). 20 tablet 0  . magic mouthwash SOLN Take 5 mLs by mouth 4 (four) times daily as needed for mouth pain. 240 mL 1  . omeprazole (PRILOSEC) 40 MG capsule Take 1 capsule (40 mg total) by mouth at bedtime. 7 capsule 0  . pegfilgrastim-bmez (ZIEXTENZO) 6 MG/0.6ML injection Inject 0.6 mLs (6 mg total) into the skin every 14 (fourteen) days. 1 Syringe 3  . polyethylene glycol powder (GLYCOLAX/MIRALAX) 17 GM/SCOOP powder Take by mouth.    . prochlorperazine (COMPAZINE) 10 MG tablet Take 1 tablet (10 mg total) by mouth every 6 (six) hours as needed (Nausea or vomiting). 30 tablet 1  . venlafaxine XR (EFFEXOR-XR) 75 MG 24 hr capsule Take  1 capsule (75 mg total) by mouth daily with breakfast. 30 capsule 6  . zolmitriptan (ZOMIG) 5 MG tablet Take 1 tablet (5 mg total) by mouth as needed for migraine. 10 tablet 2   No current facility-administered medications for this visit.    OBJECTIVE: African-American woman who appears stated age 34:   10/07/20 1003  BP: 127/79  Pulse: 90  Resp: 18  Temp: 98.2 F (36.8 C)  SpO2: 100%     Body mass index is 31.35 kg/m.   Wt Readings from Last 3 Encounters:  10/07/20 171 lb 6.4 oz (77.7 kg)  09/30/20 165 lb 14.4 oz (75.3 kg)  09/23/20 165 lb (74.8 kg)  ECOG FS:1 - Symptomatic but completely ambulatory  Sclerae unicteric, EOMs intact Wearing a mask No cervical or supraclavicular adenopathy Lungs no rales or rhonchi Heart regular rate and rhythm Abd soft, nontender, positive bowel sounds MSK no focal spinal tenderness, no upper extremity lymphedema Neuro: nonfocal, well oriented, appropriate affect Breasts: I do not palpate a mass in the left breast.   There is no skin or nipple change of concern.  Both axillae are benign.  LAB RESULTS:  CMP     Component Value Date/Time   NA 140 09/30/2020 1120   K 3.9 09/30/2020 1120   CL 108 09/30/2020 1120   CO2 23 09/30/2020 1120   GLUCOSE 112 (H) 09/30/2020 1120   BUN 12 09/30/2020 1120   CREATININE 0.75 09/30/2020 1120   CREATININE 0.92 06/04/2020 0830   CALCIUM 9.1 09/30/2020 1120   PROT 7.0 09/30/2020 1120   ALBUMIN 3.7 09/30/2020 1120   AST 19 09/30/2020 1120   AST 13 (L) 06/04/2020 0830   ALT 31 09/30/2020 1120   ALT 14 06/04/2020 0830   ALKPHOS 52 09/30/2020 1120   BILITOT 0.5 09/30/2020 1120   BILITOT 0.5 06/04/2020 0830   GFRNONAA >60 09/30/2020 1120   GFRNONAA >60 06/04/2020 0830   GFRAA >60 08/05/2020 0955   GFRAA >60 06/04/2020 0830    No results found for: TOTALPROTELP, ALBUMINELP, A1GS, A2GS, BETS, BETA2SER, GAMS, MSPIKE, SPEI  Lab Results  Component Value Date   WBC 1.7 (L) 10/07/2020   NEUTROABS PENDING 10/07/2020   HGB 9.0 (L) 10/07/2020   HCT 26.9 (L) 10/07/2020   MCV 95.1 10/07/2020   PLT 385 10/07/2020    No results found for: LABCA2  No components found for: ATFTDD220  No results for input(s): INR in the last 168 hours.  No results found for: LABCA2  No results found for: URK270  No results found for: WCB762  No results found for: GBT517  No results found for: CA2729  No components found for: HGQUANT  No results found for: CEA1 / No results found for: CEA1   No results found for: AFPTUMOR  No results found for: CHROMOGRNA  No results found for: KPAFRELGTCHN, LAMBDASER, KAPLAMBRATIO (kappa/lambda light chains)  No results found for: HGBA, HGBA2QUANT, HGBFQUANT, HGBSQUAN (Hemoglobinopathy evaluation)   No results found for: LDH  No results found for: IRON, TIBC, IRONPCTSAT (Iron and TIBC)  No results found for: FERRITIN  Urinalysis No results found for: COLORURINE, APPEARANCEUR, LABSPEC, PHURINE, GLUCOSEU, HGBUR, BILIRUBINUR,  KETONESUR, PROTEINUR, UROBILINOGEN, NITRITE, LEUKOCYTESUR   STUDIES: No results found.   ELIGIBLE FOR AVAILABLE RESEARCH PROTOCOL: no  ASSESSMENT: 34 y.o. Jeanne Evans woman status post left breast upper outer quadrant biopsy 05/29/2020 for a clinical T2 N0, stage IIB invasive ductal carcinoma, functionally triple negative, with an MIB-1 of 95%.  (1) genetics testing  06/23/2020 through the [facility] Multi-Cancer Panel found no deleterious mutations in AIP, ALK, APC, ATM, AXIN2,BAP1,  BARD1, BLM, BMPR1A, BRCA1, BRCA2, BRIP1, CASR, CDC73, CDH1, CDK4, CDKN1B, CDKN1C, CDKN2A (p14ARF), CDKN2A (p16INK4a), CEBPA, CHEK2, CTNNA1, DICER1, DIS3L2, EGFR (c.2369C>T, p.Thr790Met variant only), EPCAM (Deletion/duplication testing only), FH, FLCN, GATA2, GPC3, GREM1 (Promoter region deletion/duplication testing only), HOXB13 (c.251G>A, p.Gly84Glu), HRAS, KIT, MAX, MEN1, MET, MITF (c.952G>A, p.Glu318Lys variant only), MLH1, MSH2, MSH3, MSH6, MUTYH, NBN, NF1, NF2, NTHL1, PALB2, PDGFRA, PHOX2B, PMS2, POLD1, POLE, POT1, PRKAR1A, PTCH1, PTEN, RAD50, RAD51C, RAD51D, RB1, RECQL4, RET, RNF43, RUNX1, SDHAF2, SDHA (sequence changes only), SDHB, SDHC, SDHD, SMAD4, SMARCA4, SMARCB1, SMARCE1, STK11, SUFU, TERC, TERT, TMEM127, TP53, TSC1, TSC2, VHL, WRN and WT1.   (2) neoadjuvant chemotherapy consisting of cyclophosphamide and doxorubicin in dose dense fashion x4 started 06/19/2020, completed 08/05/2020, followed by paclitaxel and carboplatin weekly x12 starting 08/26/2020  (a) echocardiogram on 06/13/2020 showed an EF of 60-65%  (b) paclitaxel and carboplatin discontinued after 4 doses because of neuropathy and cytopenias  (3) definitive surgery to follow  (4) postmastectomy radiation as appropriate   PLAN: [place] neuropathy and low blood counts are not going to allow [place] to continue chemotherapy.  We are stopping her treatments.  I have set her up for an MRI of the breast hopefully within the next week or 10 days, and I  have alerted her surgeon Dr.Cornett that she is now ready for surgery.  I am anticipating good surgical results since I do not palpate anything in the left breast, and hopefully the MRI and pathology will confirm that.  Just in case however we are going to leave the port in place as she might need Keytruda postop depending on surgical results  We discussed Covid prevention and I suggested she receive the [place] vaccine sometime mid December by which time her counts should have recovered.  We also talked about neutropenic precautions today  She will return to see me mid January.  By that time she should be done with surgery we should have the definitive pathology and we can make a decision regarding whether any further treatment is needed and set her up for radiation.  Total encounter time 35 minutes.Sarajane Jews C. Maurion Walkowiak, MD 10/07/20 10:09 AM Medical Oncology and Hematology [facility] [place], [place] [address] Tel. [phone]    Fax. [phone]   I, Wilburn Mylar, am acting as scribe for Dr. Virgie Dad. Ziaire Bieser.  I, Lurline Del MD, have reviewed the above documentation for accuracy and completeness, and I agree with the above.   *Total Encounter Time as defined by the Centers for Medicare and Medicaid Services includes, in addition to the face-to-face time of a patient visit (documented in the note above) non-face-to-face time: obtaining and reviewing outside history, ordering and reviewing medications, tests or procedures, care coordination (communications with other health care professionals or caregivers) and documentation in the medical record.

## 2020-10-07 ENCOUNTER — Inpatient Hospital Stay: Payer: 59

## 2020-10-07 ENCOUNTER — Ambulatory Visit: Payer: 59 | Admitting: Oncology

## 2020-10-07 ENCOUNTER — Other Ambulatory Visit: Payer: Self-pay

## 2020-10-07 ENCOUNTER — Inpatient Hospital Stay (HOSPITAL_BASED_OUTPATIENT_CLINIC_OR_DEPARTMENT_OTHER): Payer: 59 | Admitting: Oncology

## 2020-10-07 VITALS — BP 127/79 | HR 90 | Temp 98.2°F | Resp 18 | Ht 62.0 in | Wt 171.4 lb

## 2020-10-07 DIAGNOSIS — C50412 Malignant neoplasm of upper-outer quadrant of left female breast: Secondary | ICD-10-CM

## 2020-10-07 DIAGNOSIS — Z170 Estrogen receptor positive status [ER+]: Secondary | ICD-10-CM | POA: Diagnosis not present

## 2020-10-07 DIAGNOSIS — Z5111 Encounter for antineoplastic chemotherapy: Secondary | ICD-10-CM | POA: Diagnosis not present

## 2020-10-07 LAB — CBC WITH DIFFERENTIAL/PLATELET
Abs Immature Granulocytes: 0 10*3/uL (ref 0.00–0.07)
Basophils Absolute: 0 10*3/uL (ref 0.0–0.1)
Basophils Relative: 1 %
Eosinophils Absolute: 0 10*3/uL (ref 0.0–0.5)
Eosinophils Relative: 1 %
HCT: 26.9 % — ABNORMAL LOW (ref 36.0–46.0)
Hemoglobin: 9 g/dL — ABNORMAL LOW (ref 12.0–15.0)
Immature Granulocytes: 0 %
Lymphocytes Relative: 47 %
Lymphs Abs: 0.8 10*3/uL (ref 0.7–4.0)
MCH: 31.8 pg (ref 26.0–34.0)
MCHC: 33.5 g/dL (ref 30.0–36.0)
MCV: 95.1 fL (ref 80.0–100.0)
Monocytes Absolute: 0.5 10*3/uL (ref 0.1–1.0)
Monocytes Relative: 27 %
Neutro Abs: 0.4 10*3/uL — CL (ref 1.7–7.7)
Neutrophils Relative %: 24 %
Platelets: 385 10*3/uL (ref 150–400)
RBC: 2.83 MIL/uL — ABNORMAL LOW (ref 3.87–5.11)
RDW: 18.5 % — ABNORMAL HIGH (ref 11.5–15.5)
WBC: 1.7 10*3/uL — ABNORMAL LOW (ref 4.0–10.5)
nRBC: 0 % (ref 0.0–0.2)

## 2020-10-07 LAB — COMPREHENSIVE METABOLIC PANEL
ALT: 28 U/L (ref 0–44)
AST: 19 U/L (ref 15–41)
Albumin: 3.6 g/dL (ref 3.5–5.0)
Alkaline Phosphatase: 54 U/L (ref 38–126)
Anion gap: 9 (ref 5–15)
BUN: 11 mg/dL (ref 6–20)
CO2: 23 mmol/L (ref 22–32)
Calcium: 8.6 mg/dL — ABNORMAL LOW (ref 8.9–10.3)
Chloride: 108 mmol/L (ref 98–111)
Creatinine, Ser: 0.72 mg/dL (ref 0.44–1.00)
GFR, Estimated: >60 mL/min (ref >60)
Glucose, Bld: 118 mg/dL — ABNORMAL HIGH (ref 70–99)
Potassium: 3.8 mmol/L (ref 3.5–5.1)
Sodium: 140 mmol/L (ref 135–145)
Total Bilirubin: 0.5 mg/dL (ref 0.3–1.2)
Total Protein: 6.7 g/dL (ref 6.5–8.1)

## 2020-10-07 MED ORDER — PROCHLORPERAZINE MALEATE 10 MG PO TABS: 10.0000 mg | ORAL_TABLET | Freq: Four times a day (QID) | ORAL | 1 refills | Status: DC | PRN

## 2020-10-07 MED ORDER — SODIUM CHLORIDE 0.9% FLUSH
10.0000 mL | Freq: Once | INTRAVENOUS | Status: AC
  Administered 2020-10-07: 10 mL
  Filled 2020-10-07: qty 10

## 2020-10-07 MED ORDER — OMEPRAZOLE 40 MG PO CPDR
40.0000 mg | DELAYED_RELEASE_CAPSULE | Freq: Every day | ORAL | 0 refills | Status: DC
Start: 2020-10-07 — End: 2021-01-19

## 2020-10-07 NOTE — Patient Instructions (Signed)

## 2020-10-08 ENCOUNTER — Other Ambulatory Visit: Payer: Self-pay | Admitting: *Deleted

## 2020-10-08 DIAGNOSIS — Z170 Estrogen receptor positive status [ER+]: Secondary | ICD-10-CM

## 2020-10-08 DIAGNOSIS — C50412 Malignant neoplasm of upper-outer quadrant of left female breast: Secondary | ICD-10-CM

## 2020-10-08 MED ORDER — PROCHLORPERAZINE MALEATE 10 MG PO TABS: 10.0000 mg | ORAL_TABLET | Freq: Four times a day (QID) | ORAL | 1 refills | Status: DC | PRN

## 2020-10-14 ENCOUNTER — Inpatient Hospital Stay: Payer: 59

## 2020-10-14 ENCOUNTER — Inpatient Hospital Stay: Payer: 59 | Admitting: Oncology

## 2020-10-14 ENCOUNTER — Encounter: Payer: Self-pay | Admitting: *Deleted

## 2020-10-20 ENCOUNTER — Ambulatory Visit
Admission: RE | Admit: 2020-10-20 | Discharge: 2020-10-20 | Disposition: A | Discharge status: Home / Self Care | Payer: 59 | Source: Ambulatory Visit | Attending: Oncology | Admitting: Oncology

## 2020-10-20 ENCOUNTER — Other Ambulatory Visit: Payer: Self-pay

## 2020-10-20 DIAGNOSIS — C50412 Malignant neoplasm of upper-outer quadrant of left female breast: Secondary | ICD-10-CM

## 2020-10-20 DIAGNOSIS — Z170 Estrogen receptor positive status [ER+]: Secondary | ICD-10-CM

## 2020-10-20 MED ORDER — GADOBUTROL 1 MMOL/ML IV SOLN
7.0000 mL | Freq: Once | INTRAVENOUS | Status: AC | PRN
  Administered 2020-10-20: 7 mL via INTRAVENOUS

## 2020-10-21 ENCOUNTER — Encounter: Payer: Self-pay | Admitting: *Deleted

## 2020-10-21 ENCOUNTER — Ambulatory Visit: Payer: 59

## 2020-10-21 ENCOUNTER — Ambulatory Visit: Payer: Self-pay | Admitting: Surgery

## 2020-10-21 ENCOUNTER — Telehealth: Payer: Self-pay | Admitting: *Deleted

## 2020-10-21 ENCOUNTER — Other Ambulatory Visit: Payer: 59

## 2020-10-21 DIAGNOSIS — C50912 Malignant neoplasm of unspecified site of left female breast: Secondary | ICD-10-CM

## 2020-10-21 NOTE — Telephone Encounter (Signed)
Butch Penny with [employer] left message about urgent appeal for pt breast MRI w wo contrast. Appeal is being processed and will give a call back on 10/23/20 with decision.

## 2020-10-21 NOTE — H&P (Signed)
[name] Appointment: 10/21/2020 4:00 PM Location: [facility] Patient #: [id number] DOB: June 27, 1986 Single / Language: [name] / Race: Black or African American Female  History of Present Illness [name] A. Sabriyah Wilcher MD; 10/21/2020 5:01 PM) Patient words: Patient returns after neoadjuvant chemotherapy for T2 in 0 MX left breast cancer. She states that his chemotherapy which was stopped early due to neutropenia. Follow-up magnetic resonance imaging showed reduction of the left breast cancer by about 60%. Her examination is normalized. She is weak from chemotherapy but is done with that and ready to proceed and was to proceed with breast conserving surgery.            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 Patient had an MR guided core biopsy of the left breast for extent of disease on 06/23/2020 which showed benign breast parenchyma with mild nonspecific inflammation. Assess response to neoadjuvant chemotherapy.  LABS: None obtained at the time of imaging.  EXAM: BILATERAL BREAST MRI WITH AND WITHOUT CONTRAST  TECHNIQUE: Multiplanar, multisequence MR images of both breasts were obtained prior to and following the intravenous administration of 7 ml of Gadavist  Three-dimensional MR images were rendered by post-processing of the original MR data on an independent workstation. The three-dimensional MR images were interpreted, and findings are reported in the following complete MRI report for this study. Three dimensional images were evaluated at the independent interpreting workstation using the DynaCAD thin client.  COMPARISON: Previous exam(s).  FINDINGS: Breast composition: c. Heterogeneous fibroglandular tissue.  Background parenchymal enhancement: Moderate.  Right breast: No mass or abnormal  enhancement. Right-sided Port-A-Cath.  Left breast: Far posteriorly in the lateral aspect of the left breast is an enhancing mass measuring 1.1 x 0.7 x 1.0 cm with a signal void artifact from the biopsy marker clip (series 6 image 88/160). On the prior MRI dated 06/16/2020 it measured 3.5 x 3.4 x 3.9 cm. Extending 3.8 cm anterior to the known malignancy is a signal void artifact from the second biopsy marker clip where nonspecific inflammation was biopsied. Non masslike enhancement is seen extending in between the 2 clips concerning for extension of disease (series 6, image 88/160).  Lymph nodes: No abnormal appearing lymph nodes.  Ancillary findings: None.  IMPRESSION: Significant interval decrease in size of the known malignancy in the left breast. There is contiguous 3.8 cm non masslike enhancement extending between the 2 biopsy marker clips concerning for anterior extent of disease.  RECOMMENDATION: Continued treatment planning of the known grade 3 invasive ductal carcinoma in the upper-outer quadrant of the left breast is recommended. If the patient is going to undergo conservative therapy I would recommend bracketed surgical excision of both clips in the lateral aspect of the left breast.  BI-RADS CATEGORY 6: Known biopsy-proven malignancy.   Electronically Signed By: [name] M.D. On: 10/20/2020 12:09.  The patient is a 34 year old female.   Past Surgical History [name], [place]; 10/21/2020 4:22 PM) Breast Biopsy Left.  Diagnostic Studies History [name], [place]; 10/21/2020 4:22 PM) Colonoscopy never Mammogram within last year Pap Smear 1-5 years ago  Allergies [name], RMA; 10/21/2020 4:23 PM) Sulfacetamide *CHEMICALS* Allergies Reconciled  Medication History [name], RMA; 10/21/2020 4:25 PM) Prochlorperazine Maleate (10MG  Tablet, Oral) Active. ZOLMitriptan (5MG  Tablet, Oral) Active. Ibuprofen (800MG  Tablet, Oral)  Active. Albuterol Sulfate HFA (108 (90 Base)MCG/ACT Aerosol Soln, Inhalation) Active. Amoxicillin (500MG   Capsule, Oral) Active. metroNIDAZOLE (500MG  Tablet, Oral) Active. Ondansetron (4MG  Tablet Disint, Oral) Active. valACYclovir HCl (500MG  Tablet, Oral) Active. valACYclovir HCl (1GM Tablet, Oral) Active. predniSONE (20MG  Tablet, Oral) Active. Medications Reconciled  Social History [name], [place]; 10/21/2020 4:22 PM) Alcohol use Occasional alcohol use. Caffeine use Coffee. Illicit drug use Uses socially only. Tobacco use Current some day smoker.  Family History [name], [place]; 10/21/2020 4:22 PM) Hypertension Father, Mother. Ovarian Cancer Family Members In General.  Pregnancy / Birth History [name], [place]; 10/21/2020 4:22 PM) Age at menarche 8 years. Contraceptive History Intrauterine device. Gravida 4 Irregular periods Length (months) of breastfeeding 7-12 Maternal age 32-20 Para 2  Other Problems [name], [place]; 10/21/2020 4:22 PM) Asthma Heart murmur Kidney Stone     Review of Systems [name] [place] RMA; 10/21/2020 4:22 PM) General Not Present- Appetite Loss, Chills, Fatigue, Fever, Night Sweats, Weight Gain and Weight Loss. Skin Not Present- Change in Wart/Mole, Dryness, Hives, Jaundice, New Lesions, Non-Healing Wounds, Rash and Ulcer. HEENT Present- Seasonal Allergies. Not Present- Earache, Hearing Loss, Hoarseness, Nose Bleed, Oral Ulcers, Ringing in the Ears, Sinus Pain, Sore Throat, Visual Disturbances, Wears glasses/contact lenses and Yellow Eyes. Respiratory Not Present- Bloody sputum, Chronic Cough, Difficulty Breathing, Snoring and Wheezing. Breast Present- Breast Pain. Not Present- Breast Mass, Nipple Discharge and Skin Changes. Cardiovascular Not Present- Chest Pain, Difficulty Breathing Lying Down, Leg Cramps, Palpitations, Rapid Heart Rate, Shortness of Breath and Swelling of Extremities. Gastrointestinal Not  Present- Abdominal Pain, Bloating, Bloody Stool, Change in Bowel Habits, Chronic diarrhea, Constipation, Difficulty Swallowing, Excessive gas, Gets full quickly at meals, Hemorrhoids, Indigestion, Nausea, Rectal Pain and Vomiting. Female Genitourinary Not Present- Frequency, Nocturia, Painful Urination, Pelvic Pain and Urgency. Musculoskeletal Not Present- Back Pain, Joint Pain, Joint Stiffness, Muscle Pain, Muscle Weakness and Swelling of Extremities. Neurological Not Present- Decreased Memory, Fainting, Headaches, Numbness, Seizures, Tingling, Tremor, Trouble walking and Weakness. Psychiatric Not Present- Anxiety, Bipolar, Change in Sleep Pattern, Depression, Fearful and Frequent crying. Endocrine Not Present- Cold Intolerance, Excessive Hunger, Hair Changes, Heat Intolerance, Hot flashes and New Diabetes. Hematology Not Present- Blood Thinners, Easy Bruising, Excessive bleeding, Gland problems, HIV and Persistent Infections.  Vitals [name] [place] RMA; 10/21/2020 4:26 PM) 10/21/2020 4:25 PM Weight: 173.25 lb Height: 62in Body Surface Area: 1.8 m Body Mass Index: 31.69 kg/m  Temp.: 98.37F  Pulse: 130 (Regular)  P.OX: 99% (Room air) BP: 124/78(Sitting, Left Arm, Standard)        Physical Exam (Reubin Bushnell A. Stephenson Cichy MD; 10/21/2020 5:03 PM)  General Mental Status-Alert. General Appearance-Consistent with stated age. Hydration-Well hydrated. Voice-Normal.  Chest and Lung Exam Chest and lung exam reveals -quiet, even and easy respiratory effort with no use of accessory muscles and on auscultation, normal breath sounds, no adventitious sounds and normal vocal resonance. Inspection Chest Wall - Normal. Back - normal.  Breast Breast - Left-Symmetric, Non Tender, No Biopsy scars, no Dimpling - Left, No Inflammation, No Lumpectomy scars, No Mastectomy scars, No Peau d' Orange. Breast - Right-Symmetric, Non Tender, No Biopsy scars, no Dimpling - Right, No Inflammation,  No Lumpectomy scars, No Mastectomy scars, No Peau d' Orange. Breast Lump-No Palpable Breast Mass. Note: Port right side noted  Cardiovascular Cardiovascular examination reveals -normal heart sounds, regular rate and rhythm with no murmurs and normal pedal pulses bilaterally.  Lymphatic Axillary  General Axillary Region: Bilateral - Description - Normal. Tenderness - Non Tender.    Assessment & Plan (Shritha Bresee A. Jeremi Losito MD; 10/21/2020 5:03 PM)  BREAST CANCER, STAGE 2, LEFT (  C50.912) Impression: Patient with good response. Recommend left breast seed localized lumpectomy using 2 seeds to bracket the area and left axillary sentinel lymph node mapping. She will keep her port for now. Risk of lumpectomy include bleeding, infection, seroma, more surgery, use of seed/wire, wound care, cosmetic deformity and the need for other treatments, death , blood clots, death. Pt agrees to proceed. Risk of sentinel lymph node mapping include bleeding, infection, lymphedema, shoulder pain. stiffness, dye allergy. cosmetic deformity , blood clots, death, need for more surgery. Pt agrees to proceed.  Current Plans Pt Education - [facility] Free Text Education/Instructions: discussed with patient and provided information. Pt Education - [facility] Breast Biopsy HCI: discussed with patient and provided information. We discussed the staging and pathophysiology of breast cancer. We discussed all of the different options for treatment for breast cancer including surgery, chemotherapy, radiation therapy, Herceptin, and antiestrogen therapy. We discussed a sentinel lymph node biopsy as she does not appear to having lymph node involvement right now. We discussed the performance of that with injection of radioactive tracer and blue dye. We discussed that she would have an incision underneath her axillary hairline. We discussed that there is a bout a 10-20% chance of having a positive node with a sentinel lymph node biopsy and we will  await the permanent pathology to make any other first further decisions in terms of her treatment. One of these options might be to return to the operating room to perform an axillary lymph node dissection. We discussed about a 1-2% risk lifetime of chronic shoulder pain as well as lymphedema associated with a sentinel lymph node biopsy. We discussed the options for treatment of the breast cancer which included lumpectomy versus a mastectomy. We discussed the performance of the lumpectomy with a wire placement. We discussed a 10-20% chance of a positive margin requiring reexcision in the operating room. We also discussed that she may need radiation therapy or antiestrogen therapy or both if she undergoes lumpectomy. We discussed the mastectomy and the postoperative care for that as well. We discussed that there is no difference in her survival whether she undergoes lumpectomy with radiation therapy or antiestrogen therapy versus a mastectomy. There is a slight difference in the local recurrence rate being 3-5% with lumpectomy and about 1% with a mastectomy. We discussed the risks of operation including bleeding, infection, possible reoperation. She understands her further therapy will be based on what her stages at the time of her operation.  Pt Education - flb breast cancer surgery: discussed with patient and provided information.

## 2020-10-21 NOTE — H&P (View-Only) (Signed)
Jeanne Evans Appointment: 10/21/2020 4:00 PM Location: [facility] Patient #: [id number] DOB: August 11, 1986 Single / Language: Cleophus Evans / Race: Black or African American Female  History of Present Illness [name] A. Kamla Skilton MD; 10/21/2020 5:01 PM) Patient words: Patient returns after neoadjuvant chemotherapy for T2 in 0 MX left breast cancer. She states that his chemotherapy which was stopped early due to neutropenia. Follow-up magnetic resonance imaging showed reduction of the left breast cancer by about 60%. Her examination is normalized. She is weak from chemotherapy but is done with that and ready to proceed and was to proceed with breast conserving surgery.            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 Patient had an MR guided core biopsy of the left breast for extent of disease on 06/23/2020 which showed benign breast parenchyma with mild nonspecific inflammation. Assess response to neoadjuvant chemotherapy.  LABS: None obtained at the time of imaging.  EXAM: BILATERAL BREAST MRI WITH AND WITHOUT CONTRAST  TECHNIQUE: Multiplanar, multisequence MR images of both breasts were obtained prior to and following the intravenous administration of 7 ml of Gadavist  Three-dimensional MR images were rendered by post-processing of the original MR data on an independent workstation. The three-dimensional MR images were interpreted, and findings are reported in the following complete MRI report for this study. Three dimensional images were evaluated at the independent interpreting workstation using the DynaCAD thin client.  COMPARISON: Previous exam(s).  FINDINGS: Breast composition: Jeanne. Heterogeneous fibroglandular tissue.  Background parenchymal enhancement: Moderate.  Right breast: No mass or abnormal  enhancement. Right-sided Port-A-Cath.  Left breast: Far posteriorly in the lateral aspect of the left breast is an enhancing mass measuring 1.1 x 0.7 x 1.0 cm with a signal void artifact from the biopsy marker clip (series 6 image 88/160). On the prior MRI dated 06/16/2020 it measured 3.5 x 3.4 x 3.9 cm. Extending 3.8 cm anterior to the known malignancy is a signal void artifact from the second biopsy marker clip where nonspecific inflammation was biopsied. Non masslike enhancement is seen extending in between the 2 clips concerning for extension of disease (series 6, image 88/160).  Lymph nodes: No abnormal appearing lymph nodes.  Ancillary findings: None.  IMPRESSION: Significant interval decrease in size of the known malignancy in the left breast. There is contiguous 3.8 cm non masslike enhancement extending between the 2 biopsy marker clips concerning for anterior extent of disease.  RECOMMENDATION: Continued treatment planning of the known grade 3 invasive ductal carcinoma in the upper-outer quadrant of the left breast is recommended. If the patient is going to undergo conservative therapy I would recommend bracketed surgical excision of both clips in the lateral aspect of the left breast.  BI-RADS CATEGORY 6: Known biopsy-proven malignancy.   Electronically Signed By: Jeanne Evans M.D. On: 10/20/2020 12:09.  The patient is a 34 year old female.   Past Surgical History Jeanne Evans, [place]; 10/21/2020 4:22 PM) Breast Biopsy Left.  Diagnostic Studies History Jeanne Evans, [place]; 10/21/2020 4:22 PM) Colonoscopy never Mammogram within last year Pap Smear 1-5 years ago  Allergies Jeanne Evans, RMA; 10/21/2020 4:23 PM) Sulfacetamide *CHEMICALS* Allergies Reconciled  Medication History Jeanne Evans, RMA; 10/21/2020 4:25 PM) Prochlorperazine Maleate (10MG  Tablet, Oral) Active. ZOLMitriptan (5MG  Tablet, Oral) Active. Ibuprofen (800MG  Tablet, Oral)  Active. Albuterol Sulfate HFA (108 (90 Base)MCG/ACT Aerosol Soln, Inhalation) Active. Amoxicillin (500MG   Capsule, Oral) Active. metroNIDAZOLE (500MG  Tablet, Oral) Active. Ondansetron (4MG  Tablet Disint, Oral) Active. valACYclovir HCl (500MG  Tablet, Oral) Active. valACYclovir HCl (1GM Tablet, Oral) Active. predniSONE (20MG  Tablet, Oral) Active. Medications Reconciled  Social History Jeanne Evans, [place]; 10/21/2020 4:22 PM) Alcohol use Occasional alcohol use. Caffeine use Coffee. Illicit drug use Uses socially only. Tobacco use Current some day smoker.  Family History Jeanne Evans, [place]; 10/21/2020 4:22 PM) Hypertension Father, Mother. Ovarian Cancer Family Members In General.  Pregnancy / Birth History Jeanne Evans, [place]; 10/21/2020 4:22 PM) Age at menarche 31 years. Contraceptive History Intrauterine device. Gravida 4 Irregular periods Length (months) of breastfeeding 7-12 Maternal age 72-20 Para 2  Other Problems Jeanne Evans, [place]; 10/21/2020 4:22 PM) Asthma Heart murmur Kidney Stone     Review of Systems [name] [place] RMA; 10/21/2020 4:22 PM) General Not Present- Appetite Loss, Chills, Fatigue, Fever, Night Sweats, Weight Gain and Weight Loss. Skin Not Present- Change in Wart/Mole, Dryness, Hives, Jaundice, New Lesions, Non-Healing Wounds, Rash and Ulcer. HEENT Present- Seasonal Allergies. Not Present- Earache, Hearing Loss, Hoarseness, Nose Bleed, Oral Ulcers, Ringing in the Ears, Sinus Pain, Sore Throat, Visual Disturbances, Wears glasses/contact lenses and Yellow Eyes. Respiratory Not Present- Bloody sputum, Chronic Cough, Difficulty Breathing, Snoring and Wheezing. Breast Present- Breast Pain. Not Present- Breast Mass, Nipple Discharge and Skin Changes. Cardiovascular Not Present- Chest Pain, Difficulty Breathing Lying Down, Leg Cramps, Palpitations, Rapid Heart Rate, Shortness of Breath and Swelling of Extremities. Gastrointestinal Not  Present- Abdominal Pain, Bloating, Bloody Stool, Change in Bowel Habits, Chronic diarrhea, Constipation, Difficulty Swallowing, Excessive gas, Gets full quickly at meals, Hemorrhoids, Indigestion, Nausea, Rectal Pain and Vomiting. Female Genitourinary Not Present- Frequency, Nocturia, Painful Urination, Pelvic Pain and Urgency. Musculoskeletal Not Present- Back Pain, Joint Pain, Joint Stiffness, Muscle Pain, Muscle Weakness and Swelling of Extremities. Neurological Not Present- Decreased Memory, Fainting, Headaches, Numbness, Seizures, Tingling, Tremor, Trouble walking and Weakness. Psychiatric Not Present- Anxiety, Bipolar, Change in Sleep Pattern, Depression, Fearful and Frequent crying. Endocrine Not Present- Cold Intolerance, Excessive Hunger, Hair Changes, Heat Intolerance, Hot flashes and New Diabetes. Hematology Not Present- Blood Thinners, Easy Bruising, Excessive bleeding, Gland problems, HIV and Persistent Infections.  Vitals [name] [place] RMA; 10/21/2020 4:26 PM) 10/21/2020 4:25 PM Weight: 173.25 lb Height: 62in Body Surface Area: 1.8 m Body Mass Index: 31.69 kg/m  Temp.: 98.23F  Pulse: 130 (Regular)  P.OX: 99% (Room air) BP: 124/78(Sitting, Left Arm, Standard)        Physical Exam (Sajad Glander A. Lulie Hurd MD; 10/21/2020 5:03 PM)  General Mental Status-Alert. General Appearance-Consistent with stated age. Hydration-Well hydrated. Voice-Normal.  Chest and Lung Exam Chest and lung exam reveals -quiet, even and easy respiratory effort with no use of accessory muscles and on auscultation, normal breath sounds, no adventitious sounds and normal vocal resonance. Inspection Chest Wall - Normal. Back - normal.  Breast Breast - Left-Symmetric, Non Tender, No Biopsy scars, no Dimpling - Left, No Inflammation, No Lumpectomy scars, No Mastectomy scars, No Peau d' Orange. Breast - Right-Symmetric, Non Tender, No Biopsy scars, no Dimpling - Right, No Inflammation,  No Lumpectomy scars, No Mastectomy scars, No Peau d' Orange. Breast Lump-No Palpable Breast Mass. Note: Port right side noted  Cardiovascular Cardiovascular examination reveals -normal heart sounds, regular rate and rhythm with no murmurs and normal pedal pulses bilaterally.  Lymphatic Axillary  General Axillary Region: Bilateral - Description - Normal. Tenderness - Non Tender.    Assessment & Plan (Corryn Madewell A. Prynce Jacober MD; 10/21/2020 5:03 PM)  BREAST CANCER, STAGE 2, LEFT (  C50.912) Impression: Patient with good response. Recommend left breast seed localized lumpectomy using 2 seeds to bracket the area and left axillary sentinel lymph node mapping. She will keep her port for now. Risk of lumpectomy include bleeding, infection, seroma, more surgery, use of seed/wire, wound care, cosmetic deformity and the need for other treatments, death , blood clots, death. Pt agrees to proceed. Risk of sentinel lymph node mapping include bleeding, infection, lymphedema, shoulder pain. stiffness, dye allergy. cosmetic deformity , blood clots, death, need for more surgery. Pt agrees to proceed.  Current Plans Pt Education - [facility] Free Text Education/Instructions: discussed with patient and provided information. Pt Education - [facility] Breast Biopsy HCI: discussed with patient and provided information. We discussed the staging and pathophysiology of breast cancer. We discussed all of the different options for treatment for breast cancer including surgery, chemotherapy, radiation therapy, Herceptin, and antiestrogen therapy. We discussed a sentinel lymph node biopsy as she does not appear to having lymph node involvement right now. We discussed the performance of that with injection of radioactive tracer and blue dye. We discussed that she would have an incision underneath her axillary hairline. We discussed that there is a bout a 10-20% chance of having a positive node with a sentinel lymph node biopsy and we will  await the permanent pathology to make any other first further decisions in terms of her treatment. One of these options might be to return to the operating room to perform an axillary lymph node dissection. We discussed about a 1-2% risk lifetime of chronic shoulder pain as well as lymphedema associated with a sentinel lymph node biopsy. We discussed the options for treatment of the breast cancer which included lumpectomy versus a mastectomy. We discussed the performance of the lumpectomy with a wire placement. We discussed a 10-20% chance of a positive margin requiring reexcision in the operating room. We also discussed that she may need radiation therapy or antiestrogen therapy or both if she undergoes lumpectomy. We discussed the mastectomy and the postoperative care for that as well. We discussed that there is no difference in her survival whether she undergoes lumpectomy with radiation therapy or antiestrogen therapy versus a mastectomy. There is a slight difference in the local recurrence rate being 3-5% with lumpectomy and about 1% with a mastectomy. We discussed the risks of operation including bleeding, infection, possible reoperation. She understands her further therapy will be based on what her stages at the time of her operation.  Pt Education - flb breast cancer surgery: discussed with patient and provided information.

## 2020-10-23 ENCOUNTER — Other Ambulatory Visit: Payer: Self-pay | Admitting: Surgery

## 2020-10-23 DIAGNOSIS — C50912 Malignant neoplasm of unspecified site of left female breast: Secondary | ICD-10-CM

## 2020-10-28 ENCOUNTER — Other Ambulatory Visit: Payer: 59

## 2020-10-28 ENCOUNTER — Ambulatory Visit: Payer: 59

## 2020-11-04 ENCOUNTER — Encounter: Payer: Self-pay | Admitting: *Deleted

## 2020-11-04 ENCOUNTER — Other Ambulatory Visit: Payer: 59

## 2020-11-04 ENCOUNTER — Ambulatory Visit: Payer: 59

## 2020-11-04 ENCOUNTER — Telehealth: Payer: Self-pay | Admitting: *Deleted

## 2020-11-04 NOTE — Telephone Encounter (Signed)
Pt left message stating she needs another letter regarding her return to work day due to pending further plan including surgery in January for her neoadjuvant breast cancer.  This RN returned call and obtained VM- message left to return call to this RN for further information on what is needed and to whom to send it to.  Of note current plan post completion of neoadjuvant chemo is to proceed to surgery 11/19/2020 and then radiation.  Pt may be eligible Keytruda infusions based on surgical outcome.

## 2020-11-05 ENCOUNTER — Encounter: Payer: Self-pay | Admitting: *Deleted

## 2020-11-11 ENCOUNTER — Other Ambulatory Visit: Payer: Self-pay

## 2020-11-11 ENCOUNTER — Ambulatory Visit: Payer: 59

## 2020-11-11 ENCOUNTER — Other Ambulatory Visit: Payer: 59

## 2020-11-11 ENCOUNTER — Encounter (HOSPITAL_BASED_OUTPATIENT_CLINIC_OR_DEPARTMENT_OTHER): Payer: Self-pay | Admitting: Surgery

## 2020-11-11 ENCOUNTER — Ambulatory Visit: Payer: 59 | Admitting: Adult Health

## 2020-11-15 HISTORY — PX: BREAST LUMPECTOMY: SHX2

## 2020-11-17 ENCOUNTER — Encounter (HOSPITAL_BASED_OUTPATIENT_CLINIC_OR_DEPARTMENT_OTHER)
Admission: RE | Admit: 2020-11-17 | Discharge: 2020-11-17 | Disposition: A | Discharge status: Home / Self Care | Payer: 59 | Source: Ambulatory Visit | Attending: Surgery | Admitting: Surgery

## 2020-11-17 ENCOUNTER — Other Ambulatory Visit (HOSPITAL_COMMUNITY)
Admission: RE | Admit: 2020-11-17 | Discharge: 2020-11-17 | Disposition: A | Discharge status: Home / Self Care | Payer: 59 | Source: Ambulatory Visit | Attending: Surgery | Admitting: Surgery

## 2020-11-17 DIAGNOSIS — Z01812 Encounter for preprocedural laboratory examination: Secondary | ICD-10-CM | POA: Insufficient documentation

## 2020-11-17 DIAGNOSIS — Z20822 Contact with and (suspected) exposure to covid-19: Secondary | ICD-10-CM | POA: Diagnosis not present

## 2020-11-17 LAB — CBC WITH DIFFERENTIAL/PLATELET
Abs Immature Granulocytes: 0 10*3/uL (ref 0.00–0.07)
Basophils Absolute: 0 10*3/uL (ref 0.0–0.1)
Basophils Relative: 0 %
Eosinophils Absolute: 1 10*3/uL — ABNORMAL HIGH (ref 0.0–0.5)
Eosinophils Relative: 23 %
HCT: 35.7 % — ABNORMAL LOW (ref 36.0–46.0)
Hemoglobin: 11.4 g/dL — ABNORMAL LOW (ref 12.0–15.0)
Immature Granulocytes: 0 %
Lymphocytes Relative: 42 %
Lymphs Abs: 1.9 10*3/uL (ref 0.7–4.0)
MCH: 30.2 pg (ref 26.0–34.0)
MCHC: 31.9 g/dL (ref 30.0–36.0)
MCV: 94.7 fL (ref 80.0–100.0)
Monocytes Absolute: 0.5 10*3/uL (ref 0.1–1.0)
Monocytes Relative: 10 %
Neutro Abs: 1.1 10*3/uL — ABNORMAL LOW (ref 1.7–7.7)
Neutrophils Relative %: 25 %
Platelets: 249 10*3/uL (ref 150–400)
RBC: 3.77 MIL/uL — ABNORMAL LOW (ref 3.87–5.11)
RDW: 14.2 % (ref 11.5–15.5)
WBC: 4.6 10*3/uL (ref 4.0–10.5)
nRBC: 0 % (ref 0.0–0.2)

## 2020-11-17 LAB — COMPREHENSIVE METABOLIC PANEL
ALT: 45 U/L — ABNORMAL HIGH (ref 0–44)
AST: 35 U/L (ref 15–41)
Albumin: 4 g/dL (ref 3.5–5.0)
Alkaline Phosphatase: 60 U/L (ref 38–126)
Anion gap: 10 (ref 5–15)
BUN: 15 mg/dL (ref 6–20)
CO2: 26 mmol/L (ref 22–32)
Calcium: 9.5 mg/dL (ref 8.9–10.3)
Chloride: 102 mmol/L (ref 98–111)
Creatinine, Ser: 0.87 mg/dL (ref 0.44–1.00)
GFR, Estimated: >60 mL/min (ref >60)
Glucose, Bld: 115 mg/dL — ABNORMAL HIGH (ref 70–99)
Potassium: 4.5 mmol/L (ref 3.5–5.1)
Sodium: 138 mmol/L (ref 135–145)
Total Bilirubin: 0.6 mg/dL (ref 0.3–1.2)
Total Protein: 7.2 g/dL (ref 6.5–8.1)

## 2020-11-17 LAB — POCT PREGNANCY, URINE: Preg Test, Ur: NEGATIVE

## 2020-11-17 NOTE — Progress Notes (Signed)

## 2020-11-18 ENCOUNTER — Other Ambulatory Visit: Payer: Self-pay | Admitting: Surgery

## 2020-11-18 ENCOUNTER — Ambulatory Visit
Admission: RE | Admit: 2020-11-18 | Discharge: 2020-11-18 | Disposition: A | Discharge status: Home / Self Care | Payer: 59 | Source: Ambulatory Visit | Attending: Surgery | Admitting: Surgery

## 2020-11-18 ENCOUNTER — Other Ambulatory Visit: Payer: Self-pay

## 2020-11-18 DIAGNOSIS — C50912 Malignant neoplasm of unspecified site of left female breast: Secondary | ICD-10-CM

## 2020-11-18 LAB — SARS CORONAVIRUS 2 (TAT 6-24 HRS): SARS Coronavirus 2: NEGATIVE

## 2020-11-19 ENCOUNTER — Ambulatory Visit (HOSPITAL_BASED_OUTPATIENT_CLINIC_OR_DEPARTMENT_OTHER): Payer: 59 | Admitting: Certified Registered"

## 2020-11-19 ENCOUNTER — Encounter (HOSPITAL_BASED_OUTPATIENT_CLINIC_OR_DEPARTMENT_OTHER)
Admission: RE | Disposition: A | Discharge status: Home / Self Care | Payer: Self-pay | Source: Home / Self Care | Attending: Surgery

## 2020-11-19 ENCOUNTER — Ambulatory Visit (HOSPITAL_COMMUNITY)
Admission: RE | Admit: 2020-11-19 | Discharge: 2020-11-19 | Disposition: A | Discharge status: Home / Self Care | Payer: 59 | Source: Ambulatory Visit | Attending: Surgery | Admitting: Surgery

## 2020-11-19 ENCOUNTER — Ambulatory Visit
Admission: RE | Admit: 2020-11-19 | Discharge: 2020-11-19 | Disposition: A | Discharge status: Home / Self Care | Payer: 59 | Source: Ambulatory Visit | Attending: Surgery | Admitting: Surgery

## 2020-11-19 ENCOUNTER — Encounter (HOSPITAL_BASED_OUTPATIENT_CLINIC_OR_DEPARTMENT_OTHER): Payer: Self-pay | Admitting: Surgery

## 2020-11-19 ENCOUNTER — Ambulatory Visit (HOSPITAL_BASED_OUTPATIENT_CLINIC_OR_DEPARTMENT_OTHER)
Admission: RE | Admit: 2020-11-19 | Discharge: 2020-11-19 | Disposition: A | Discharge status: Home / Self Care | Payer: 59 | Attending: Surgery | Admitting: Surgery

## 2020-11-19 ENCOUNTER — Other Ambulatory Visit: Payer: Self-pay

## 2020-11-19 DIAGNOSIS — Z7952 Long term (current) use of systemic steroids: Secondary | ICD-10-CM | POA: Insufficient documentation

## 2020-11-19 DIAGNOSIS — Z79899 Other long term (current) drug therapy: Secondary | ICD-10-CM | POA: Diagnosis not present

## 2020-11-19 DIAGNOSIS — F17200 Nicotine dependence, unspecified, uncomplicated: Secondary | ICD-10-CM | POA: Insufficient documentation

## 2020-11-19 DIAGNOSIS — C50912 Malignant neoplasm of unspecified site of left female breast: Secondary | ICD-10-CM

## 2020-11-19 DIAGNOSIS — C50412 Malignant neoplasm of upper-outer quadrant of left female breast: Secondary | ICD-10-CM | POA: Diagnosis present

## 2020-11-19 HISTORY — DX: Gastro-esophageal reflux disease without esophagitis: K21.9

## 2020-11-19 HISTORY — PX: BREAST LUMPECTOMY WITH RADIOACTIVE SEED AND SENTINEL LYMPH NODE BIOPSY: SHX6550

## 2020-11-19 SURGERY — BREAST LUMPECTOMY WITH RADIOACTIVE SEED AND SENTINEL LYMPH NODE BIOPSY
Anesthesia: General | Site: Breast | Laterality: Left

## 2020-11-19 MED ORDER — ACETAMINOPHEN 500 MG PO TABS
1000.0000 mg | ORAL_TABLET | ORAL | Status: AC
  Administered 2020-11-19: 1000 mg via ORAL

## 2020-11-19 MED ORDER — GABAPENTIN 300 MG PO CAPS
ORAL_CAPSULE | ORAL | Status: AC
  Filled 2020-11-19: qty 1

## 2020-11-19 MED ORDER — FENTANYL CITRATE (PF) 100 MCG/2ML IJ SOLN
25.0000 ug | INTRAMUSCULAR | Status: DC | PRN
  Administered 2020-11-19 (×2): 25 ug via INTRAVENOUS

## 2020-11-19 MED ORDER — AMISULPRIDE (ANTIEMETIC) 5 MG/2ML IV SOLN: 10.0000 mg | Freq: Once | INTRAVENOUS | Status: DC | PRN

## 2020-11-19 MED ORDER — FENTANYL CITRATE (PF) 100 MCG/2ML IJ SOLN
INTRAMUSCULAR | Status: AC
  Filled 2020-11-19: qty 2

## 2020-11-19 MED ORDER — MIDAZOLAM HCL 2 MG/2ML IJ SOLN
1.0000 mg | Freq: Once | INTRAMUSCULAR | Status: AC
  Administered 2020-11-19: 2 mg via INTRAVENOUS

## 2020-11-19 MED ORDER — IBUPROFEN 800 MG PO TABS: 800.0000 mg | ORAL_TABLET | Freq: Three times a day (TID) | ORAL | 0 refills | Status: DC | PRN

## 2020-11-19 MED ORDER — KETOROLAC TROMETHAMINE 30 MG/ML IJ SOLN
INTRAMUSCULAR | Status: AC
  Filled 2020-11-19: qty 1

## 2020-11-19 MED ORDER — ONDANSETRON HCL 4 MG/2ML IJ SOLN: 4.0000 mg | Freq: Once | INTRAMUSCULAR | Status: DC | PRN

## 2020-11-19 MED ORDER — PROPOFOL 500 MG/50ML IV EMUL
INTRAVENOUS | Status: DC | PRN
  Administered 2020-11-19: 25 ug/kg/min via INTRAVENOUS

## 2020-11-19 MED ORDER — OXYCODONE HCL 5 MG PO TABS
ORAL_TABLET | ORAL | Status: AC
  Filled 2020-11-19: qty 1

## 2020-11-19 MED ORDER — FENTANYL CITRATE (PF) 100 MCG/2ML IJ SOLN
INTRAMUSCULAR | Status: DC | PRN
  Administered 2020-11-19 (×5): 50 ug via INTRAVENOUS

## 2020-11-19 MED ORDER — DEXTROSE 5 % IV SOLN
3.0000 g | INTRAVENOUS | Status: AC
  Administered 2020-11-19: 2 g via INTRAVENOUS
  Filled 2020-11-19: qty 3000

## 2020-11-19 MED ORDER — OXYCODONE HCL 5 MG PO TABS: 5.0000 mg | ORAL_TABLET | Freq: Four times a day (QID) | ORAL | 0 refills | Status: DC | PRN

## 2020-11-19 MED ORDER — TECHNETIUM TC 99M TILMANOCEPT KIT
1.0000 | PACK | Freq: Once | INTRAVENOUS | Status: AC | PRN
  Administered 2020-11-19: 1 via INTRADERMAL

## 2020-11-19 MED ORDER — KETOROLAC TROMETHAMINE 30 MG/ML IJ SOLN
30.0000 mg | Freq: Once | INTRAMUSCULAR | Status: AC
  Administered 2020-11-19: 30 mg via INTRAVENOUS

## 2020-11-19 MED ORDER — CEFAZOLIN SODIUM-DEXTROSE 2-4 GM/100ML-% IV SOLN
INTRAVENOUS | Status: AC
  Filled 2020-11-19: qty 100

## 2020-11-19 MED ORDER — ACETAMINOPHEN 500 MG PO TABS
ORAL_TABLET | ORAL | Status: AC
  Filled 2020-11-19: qty 2

## 2020-11-19 MED ORDER — FENTANYL CITRATE (PF) 100 MCG/2ML IJ SOLN
50.0000 ug | Freq: Once | INTRAMUSCULAR | Status: AC
  Administered 2020-11-19: 100 ug via INTRAVENOUS

## 2020-11-19 MED ORDER — BUPIVACAINE HCL (PF) 0.25 % IJ SOLN
INTRAMUSCULAR | Status: DC | PRN
  Administered 2020-11-19: 30 mL

## 2020-11-19 MED ORDER — CHLORHEXIDINE GLUCONATE CLOTH 2 % EX PADS: 6.0000 | MEDICATED_PAD | Freq: Once | CUTANEOUS | Status: DC

## 2020-11-19 MED ORDER — LACTATED RINGERS IV SOLN: INTRAVENOUS | Status: DC

## 2020-11-19 MED ORDER — ONDANSETRON HCL 4 MG/2ML IJ SOLN
INTRAMUSCULAR | Status: DC | PRN
  Administered 2020-11-19 (×2): 4 mg via INTRAVENOUS

## 2020-11-19 MED ORDER — OXYCODONE HCL 5 MG PO TABS
5.0000 mg | ORAL_TABLET | Freq: Once | ORAL | Status: AC | PRN
  Administered 2020-11-19: 5 mg via ORAL

## 2020-11-19 MED ORDER — MIDAZOLAM HCL 2 MG/2ML IJ SOLN
INTRAMUSCULAR | Status: AC
  Filled 2020-11-19: qty 2

## 2020-11-19 MED ORDER — LIDOCAINE HCL (CARDIAC) PF 100 MG/5ML IV SOSY
PREFILLED_SYRINGE | INTRAVENOUS | Status: DC | PRN
  Administered 2020-11-19: 40 mg via INTRAVENOUS

## 2020-11-19 MED ORDER — BUPIVACAINE-EPINEPHRINE (PF) 0.25% -1:200000 IJ SOLN
INTRAMUSCULAR | Status: DC | PRN
  Administered 2020-11-19: 23 mL

## 2020-11-19 MED ORDER — DEXAMETHASONE SODIUM PHOSPHATE 4 MG/ML IJ SOLN
INTRAMUSCULAR | Status: DC | PRN
  Administered 2020-11-19: 5 mg via INTRAVENOUS

## 2020-11-19 MED ORDER — GABAPENTIN 300 MG PO CAPS
300.0000 mg | ORAL_CAPSULE | ORAL | Status: AC
  Administered 2020-11-19: 300 mg via ORAL

## 2020-11-19 MED ORDER — OXYCODONE HCL 5 MG/5ML PO SOLN: 5.0000 mg | Freq: Once | ORAL | Status: AC | PRN

## 2020-11-19 MED ORDER — PROPOFOL 10 MG/ML IV BOLUS
INTRAVENOUS | Status: DC | PRN
  Administered 2020-11-19: 200 mg via INTRAVENOUS

## 2020-11-19 MED ORDER — 0.9 % SODIUM CHLORIDE (POUR BTL) OPTIME
TOPICAL | Status: DC | PRN
  Administered 2020-11-19: 200 mL

## 2020-11-19 SURGICAL SUPPLY — 51 items
APPLIER CLIP 9.375 MED OPEN (MISCELLANEOUS) ×2
BINDER BREAST LRG (GAUZE/BANDAGES/DRESSINGS) IMPLANT
BINDER BREAST MEDIUM (GAUZE/BANDAGES/DRESSINGS) IMPLANT
BINDER BREAST XLRG (GAUZE/BANDAGES/DRESSINGS) ×2 IMPLANT
BINDER BREAST XXLRG (GAUZE/BANDAGES/DRESSINGS) IMPLANT
BLADE SURG 15 STRL LF DISP TIS (BLADE) ×1 IMPLANT
BLADE SURG 15 STRL SS (BLADE) ×1
CANISTER SUC SOCK COL 7IN (MISCELLANEOUS) IMPLANT
CANISTER SUCT 1200ML W/VALVE (MISCELLANEOUS) ×2 IMPLANT
CHLORAPREP W/TINT 26 (MISCELLANEOUS) ×2 IMPLANT
CLIP APPLIE 9.375 MED OPEN (MISCELLANEOUS) ×1 IMPLANT
COVER BACK TABLE 60X90IN (DRAPES) ×2 IMPLANT
COVER MAYO STAND STRL (DRAPES) ×2 IMPLANT
COVER PROBE W GEL 5X96 (DRAPES) ×2 IMPLANT
COVER WAND RF STERILE (DRAPES) IMPLANT
DECANTER SPIKE VIAL GLASS SM (MISCELLANEOUS) ×2 IMPLANT
DERMABOND ADVANCED (GAUZE/BANDAGES/DRESSINGS) ×1
DERMABOND ADVANCED .7 DNX12 (GAUZE/BANDAGES/DRESSINGS) ×1 IMPLANT
DRAPE LAPAROSCOPIC ABDOMINAL (DRAPES) ×2 IMPLANT
DRAPE UTILITY XL STRL (DRAPES) ×2 IMPLANT
ELECT COATED BLADE 2.86 ST (ELECTRODE) ×2 IMPLANT
ELECT REM PT RETURN 9FT ADLT (ELECTROSURGICAL) ×2
ELECTRODE REM PT RTRN 9FT ADLT (ELECTROSURGICAL) ×1 IMPLANT
GLOVE BIOGEL PI IND STRL 7.0 (GLOVE) ×1 IMPLANT
GLOVE BIOGEL PI INDICATOR 7.0 (GLOVE) ×1
GLOVE ECLIPSE 6.5 STRL STRAW (GLOVE) ×2 IMPLANT
GLOVE ECLIPSE 8.0 STRL XLNG CF (GLOVE) ×2 IMPLANT
GLOVE SRG 8 PF TXTR STRL LF DI (GLOVE) ×1 IMPLANT
GLOVE SURG UNDER POLY LF SZ8 (GLOVE) ×1
GOWN STRL REUS W/ TWL LRG LVL3 (GOWN DISPOSABLE) ×1 IMPLANT
GOWN STRL REUS W/ TWL XL LVL3 (GOWN DISPOSABLE) ×1 IMPLANT
GOWN STRL REUS W/TWL LRG LVL3 (GOWN DISPOSABLE) ×1
GOWN STRL REUS W/TWL XL LVL3 (GOWN DISPOSABLE) ×1
HEMOSTAT ARISTA ABSORB 3G PWDR (HEMOSTASIS) ×2 IMPLANT
HEMOSTAT SNOW SURGICEL 2X4 (HEMOSTASIS) IMPLANT
KIT MARKER MARGIN INK (KITS) ×2 IMPLANT
NDL SAFETY ECLIPSE 18X1.5 (NEEDLE) IMPLANT
NEEDLE HYPO 18GX1.5 SHARP (NEEDLE)
NEEDLE HYPO 25X1 1.5 SAFETY (NEEDLE) ×2 IMPLANT
NS IRRIG 1000ML POUR BTL (IV SOLUTION) ×2 IMPLANT
PACK BASIN DAY SURGERY FS (CUSTOM PROCEDURE TRAY) ×2 IMPLANT
PENCIL SMOKE EVACUATOR (MISCELLANEOUS) ×2 IMPLANT
SLEEVE SCD COMPRESS KNEE MED (MISCELLANEOUS) ×2 IMPLANT
SPONGE LAP 4X18 RFD (DISPOSABLE) ×4 IMPLANT
SUT MNCRL AB 4-0 PS2 18 (SUTURE) ×2 IMPLANT
SUT VICRYL 3-0 CR8 SH (SUTURE) ×4 IMPLANT
SYR CONTROL 10ML LL (SYRINGE) ×2 IMPLANT
TOWEL GREEN STERILE FF (TOWEL DISPOSABLE) ×2 IMPLANT
TRAY FAXITRON CT DISP (TRAY / TRAY PROCEDURE) ×2 IMPLANT
TUBE CONNECTING 20X1/4 (TUBING) ×2 IMPLANT
YANKAUER SUCT BULB TIP NO VENT (SUCTIONS) ×2 IMPLANT

## 2020-11-19 NOTE — Progress Notes (Signed)
Assisted Dr. Christella Hartigan with left, ultrasound guided, pectoralis block. Side rails up, monitors on throughout procedure. See vital signs in flow sheet. Tolerated Procedure well.

## 2020-11-19 NOTE — Anesthesia Procedure Notes (Signed)
Procedure Name: LMA Insertion Date/Time: 11/19/2020 11:23 AM Performed by: Sheryn Bison, CRNA Pre-anesthesia Checklist: Patient identified, Emergency Drugs available, Suction available and Patient being monitored Patient Re-evaluated:Patient Re-evaluated prior to induction Oxygen Delivery Method: Circle System Utilized Preoxygenation: Pre-oxygenation with 100% oxygen Induction Type: IV induction Ventilation: Mask ventilation without difficulty LMA: LMA inserted LMA Size: 4.0 Number of attempts: 1 Airway Equipment and Method: bite block Placement Confirmation: positive ETCO2 Tube secured with: Tape Dental Injury: Teeth and Oropharynx as per pre-operative assessment

## 2020-11-19 NOTE — Anesthesia Postprocedure Evaluation (Signed)
Anesthesia Post Note  Patient: Jeanne Evans  Procedure(s) Performed: LEFT BREAST LUMPECTOMY X 2 WITH RADIOACTIVE SEED AND SENTINEL LYMPH NODE MAPPING (Left Breast)     Patient location during evaluation: PACU Anesthesia Type: General Level of consciousness: awake and alert Pain management: pain level controlled Vital Signs Assessment: post-procedure vital signs reviewed and stable Respiratory status: spontaneous breathing, nonlabored ventilation and respiratory function stable Cardiovascular status: blood pressure returned to baseline and stable Postop Assessment: no apparent nausea or vomiting Anesthetic complications: no   No complications documented.  Last Vitals:  Vitals:   11/19/20 1345 11/19/20 1429  BP: (!) 162/99 (!) 161/100  Pulse: 97 99  Resp: 19 18  Temp:  (!) 36.3 C  SpO2: 97% 99%    Last Pain:  Vitals:   11/19/20 1429  TempSrc:   PainSc: 4                  Lucretia Kern

## 2020-11-19 NOTE — Interval H&P Note (Signed)
History and Physical Interval Note:  11/19/2020 11:05 AM  Jeanne Evans  has presented today for surgery, with the diagnosis of LEFT BREAST CANCER.  The various methods of treatment have been discussed with the patient and family. After consideration of risks, benefits and other options for treatment, the patient has consented to  Procedure(s) with comments: LEFT BREAST LUMPECTOMY X 2 WITH RADIOACTIVE SEED AND SENTINEL LYMPH NODE MAPPING (Left) - PECTORAL BLOCK as a surgical intervention.  The patient's history has been reviewed, patient examined, no change in status, stable for surgery.  I have reviewed the patient's chart and labs.  Questions were answered to the patient's satisfaction.     Suhaib Guzzo A Arlenis Blaydes

## 2020-11-19 NOTE — Anesthesia Preprocedure Evaluation (Signed)
Anesthesia Evaluation  Patient identified by MRN, date of birth, ID band Patient awake    Reviewed: Allergy & Precautions, NPO status , Patient's Chart, lab work & pertinent test results  History of Anesthesia Complications Negative for: history of anesthetic complications  Airway Mallampati: II  TM Distance: >3 FB Neck ROM: Full    Dental  (+) Teeth Intact   Pulmonary asthma , Patient abstained from smoking.,    Pulmonary exam normal        Cardiovascular negative cardio ROS Normal cardiovascular exam     Neuro/Psych negative neurological ROS     GI/Hepatic Neg liver ROS, GERD  ,  Endo/Other  negative endocrine ROS  Renal/GU negative Renal ROS  negative genitourinary   Musculoskeletal negative musculoskeletal ROS (+)   Abdominal   Peds  Hematology  (+) anemia ,   Anesthesia Other Findings  Breast cancer  Reproductive/Obstetrics                           Anesthesia Physical Anesthesia Plan  ASA: II  Anesthesia Plan: General   Post-op Pain Management: GA combined w/ Regional for post-op pain   Induction: Intravenous  PONV Risk Score and Plan: 3 and Ondansetron, Dexamethasone, Midazolam and Treatment may vary due to age or medical condition  Airway Management Planned: LMA  Additional Equipment: None  Intra-op Plan:   Post-operative Plan: Extubation in OR  Informed Consent: I have reviewed the patients History and Physical, chart, labs and discussed the procedure including the risks, benefits and alternatives for the proposed anesthesia with the patient or authorized representative who has indicated his/her understanding and acceptance.     Dental advisory given  Plan Discussed with:   Anesthesia Plan Comments:        Anesthesia Quick Evaluation

## 2020-11-19 NOTE — Anesthesia Procedure Notes (Signed)
Anesthesia Regional Block: Pectoralis block   Pre-Anesthetic Checklist: ,, timeout performed, Correct Patient, Correct Site, Correct Laterality, Correct Procedure, Correct Position, site marked, Risks and benefits discussed,  Surgical consent,  Pre-op evaluation,  At surgeon's request and post-op pain management  Laterality: Left  Prep: chloraprep       Needles:  Injection technique: Single-shot  Needle Type: Echogenic Stimulator Needle     Needle Length: 10cm  Needle Gauge: 20     Additional Needles:   Procedures:,,,, ultrasound used (permanent image in chart),,,,  Narrative:  Start time: 11/19/2020 10:50 AM End time: 11/19/2020 10:55 AM Injection made incrementally with aspirations every 5 mL.  Performed by: Personally  Anesthesiologist: Lucretia Kern, MD  Additional Notes: Standard monitors applied. Skin prepped. Good needle visualization with ultrasound. Injection made in 5cc increments with no resistance to injection. Patient tolerated the procedure well.

## 2020-11-19 NOTE — Transfer of Care (Signed)
Immediate Anesthesia Transfer of Care Note  Patient: Jeanne Evans  Procedure(s) Performed: LEFT BREAST LUMPECTOMY X 2 WITH RADIOACTIVE SEED AND SENTINEL LYMPH NODE MAPPING (Left Breast)  Patient Location: PACU  Anesthesia Type:GA combined with regional for post-op pain  Level of Consciousness: awake, alert , oriented and patient cooperative  Airway & Oxygen Therapy: Patient Spontanous Breathing and Patient connected to face mask oxygen  Post-op Assessment: Report given to RN and Post -op Vital signs reviewed and stable  Post vital signs: Reviewed and stable  Last Vitals:  Vitals Value Taken Time  BP 175/110 11/19/20 1256  Temp    Pulse 106 11/19/20 1257  Resp    SpO2 100 % 11/19/20 1257  Vitals shown include unvalidated device data.  Last Pain:  Vitals:   11/19/20 1003  TempSrc: Oral  PainSc: 7       Patients Stated Pain Goal: 5 (11/19/20 1003)  Complications: No complications documented.

## 2020-11-19 NOTE — Op Note (Signed)
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  Her options of breast conserving surgery were weighed against her option of mastectomy with reconstruction.  She was seen by medical and radiation oncology.  She opted for breast conserving surgery.  She presents today for left breast seed localized lumpectomy and left axillary sentinel lymph node mapping.The procedure has been discussed with the patient. Alternatives to surgery have been discussed with the patient.  Risks of surgery include bleeding,  Infection,  Seroma formation, death,  and the need for further surgery.   The patient understands and wishes to proceed.Sentinel lymph node mapping and dissection has been discussed with the patient.  Risk of bleeding,  Infection,  Seroma formation,  Additional procedures,,  Shoulder weakness ,  Shoulder stiffness,  Nerve and blood vessel injury and reaction to the mapping dyes have been discussed.  Alternatives to surgery have been discussed with the patient.  The patient agrees to proceed.    Description of procedure: The patient was met in the holding area and questions were answered.  Neoprobe used to identify both seeds left breast upper outer quadrant.  She underwent pectoral block and injection of technetium sulfur colloid per radiology protocol.  All questions were answered.  She was taken back to the operating.  She is placed supine upon the OR table.  Induction of general esthesia, left breast  was prepped and draped in sterile fashion timeout performed.  Proper patient, site and procedure were verified and she received appropriate preoperative antibiotics.  Films were available in the OR.  Neoprobe was used to identify the seed in the left upper outer quadrant.  There are 2 seeds.  Curvilinear incision was made between both signals.  Dissection was carried down and the more anterior and posterior seeds were taken as 1 mass.  Additional anteromedial margin was also taken.  Gross margins appeared negative.  Faxitron revealed the seed and clip to be in supposed specimen at both sites.  This was inked and sent to pathology.  The cavities made hemostatic.  Clips used to mark the cavity.  Cavity closed with a deep layer 3-0 Vicryl and 4-0 Monocryl.  Local anesthetic was infiltrated.  The neoprobe settings were changed to technetium.  Hotspot identified in left axilla.  A 4 cm incision was made.  Dissection was carried down to the level 1 contents.  There were 3 hot nodes identified and removed.  Background counts approaches 0.  The long thoracic nerve, thoracodorsal trunk, and axillary vein were all preserved and identified to the incision.  Hemostasis was achieved with cautery as well as Arista.  Prior to this the cavity is irrigated.  It was then closed with a deep layer of 3-0 Vicryl and 4-0 Monocryl.  Dermabond applied.  Breast binder placed.  All counts were found to be correct.  The patient was awoke extubated taken to recovery in satisfactory condition.

## 2020-11-19 NOTE — Progress Notes (Signed)
Assisted with nuc med injections. Side rails up, monitors on throughout procedure. See vital signs in flow sheet. Tolerated Procedure well. 

## 2020-11-19 NOTE — Discharge Instructions (Signed)
[place] Office Phone Number [phone]  BREAST BIOPSY/ PARTIAL MASTECTOMY: POST OP INSTRUCTIONS  Always review your discharge instruction sheet given to you by the facility where your surgery was performed.  IF YOU HAVE DISABILITY OR FAMILY LEAVE FORMS, YOU MUST BRING THEM TO THE OFFICE FOR PROCESSING.  DO NOT GIVE THEM TO YOUR DOCTOR.  1. A prescription for pain medication may be given to you upon discharge.  Take your pain medication as prescribed, if needed.  If narcotic pain medicine is not needed, then you may take acetaminophen (Tylenol) or ibuprofen (Advil) as needed. 2. Take your usually prescribed medications unless otherwise directed 3. If you need a refill on your pain medication, please contact your pharmacy.  They will contact our office to request authorization.  Prescriptions will not be filled after 5pm or on week-ends. 4. You should eat very light the first 24 hours after surgery, such as soup, crackers, pudding, etc.  Resume your normal diet the day after surgery. 5. Most patients will experience some swelling and bruising in the breast.  Ice packs and a good support bra will help.  Swelling and bruising can take several days to resolve.  6. It is common to experience some constipation if taking pain medication after surgery.  Increasing fluid intake and taking a stool softener will usually help or prevent this problem from occurring.  A mild laxative (Milk of Magnesia or Miralax) should be taken according to package directions if there are no bowel movements after 48 hours. 7. Unless discharge instructions indicate otherwise, you may remove your bandages 24-48 hours after surgery, and you may shower at that time.  You may have steri-strips (small skin tapes) in place directly over the incision.  These strips should be left on the skin for 7-10 days.  If your surgeon used skin glue on the incision, you may shower in 24 hours.  The glue will flake off over the  next 2-3 weeks.  Any sutures or staples will be removed at the office during your follow-up visit. 8. ACTIVITIES:  You may resume regular daily activities (gradually increasing) beginning the next day.  Wearing a good support bra or sports bra minimizes pain and swelling.  You may have sexual intercourse when it is comfortable. a. You may drive when you no longer are taking prescription pain medication, you can comfortably wear a seatbelt, and you can safely maneuver your car and apply brakes. b. RETURN TO WORK:  ______________________________________________________________________________________ 9. You should see your doctor in the office for a follow-up appointment approximately two weeks after your surgery.  Your doctor's nurse will typically make your follow-up appointment when she calls you with your pathology report.  Expect your pathology report 2-3 business days after your surgery.  You may call to check if you do not hear from [place] after three days. 10. OTHER INSTRUCTIONS: _______________________________________________________________________________________________ _____________________________________________________________________________________________________________________________________ _____________________________________________________________________________________________________________________________________ _____________________________________________________________________________________________________________________________________  WHEN TO CALL YOUR DOCTOR: 1. Fever over 101.0 2. Nausea and/or vomiting. 3. Extreme swelling or bruising. 4. Continued bleeding from incision. 5. Increased pain, redness, or drainage from the incision.  The clinic staff is available to answer your questions during regular business hours.  Please don't hesitate to call and ask to speak to one of the nurses for clinical concerns.  If you have a medical emergency, go to the nearest  emergency room or call 911.  A surgeon from [facility] is always on call at the hospital.  For further questions, please visit [email or web address]  May take Tylenol after 4pm, if needed.    Post Anesthesia Home Care Instructions  Activity: Get plenty of rest for the remainder of the day. A responsible individual must stay with you for 24 hours following the procedure.  For the next 24 hours, DO NOT: -Drive a car -Advertising copywriter -Drink alcoholic beverages -Take any medication unless instructed by your physician -Make any legal decisions or sign important papers.  Meals: Start with liquid foods such as gelatin or soup. Progress to regular foods as tolerated. Avoid greasy, spicy, heavy foods. If nausea and/or vomiting occur, drink only clear liquids until the nausea and/or vomiting subsides. Call your physician if vomiting continues.  Special Instructions/Symptoms: Your throat may feel dry or sore from the anesthesia or the breathing tube placed in your throat during surgery. If this causes discomfort, gargle with warm salt water. The discomfort should disappear within 24 hours.  If you had a scopolamine patch placed behind your ear for the management of post- operative nausea and/or vomiting:  1. The medication in the patch is effective for 72 hours, after which it should be removed.  Wrap patch in a tissue and discard in the trash. Wash hands thoroughly with soap and water. 2. You may remove the patch earlier than 72 hours if you experience unpleasant side effects which may include dry mouth, dizziness or visual disturbances. 3. Avoid touching the patch. Wash your hands with soap and water after contact with the patch.    Regional Anesthesia Blocks  1. Numbness or the inability to move the "blocked" extremity may last from 3-48 hours after placement. The length of time depends on the medication injected and your individual response to the medication. If the  numbness is not going away after 48 hours, call your surgeon.  2. The extremity that is blocked will need to be protected until the numbness is gone and the  Strength has returned. Because you cannot feel it, you will need to take extra care to avoid injury. Because it may be weak, you may have difficulty moving it or using it. You may not know what position it is in without looking at it while the block is in effect.  3. For blocks in the legs and feet, returning to weight bearing and walking needs to be done carefully. You will need to wait until the numbness is entirely gone and the strength has returned. You should be able to move your leg and foot normally before you try and bear weight or walk. You will need someone to be with you when you first try to ensure you do not fall and possibly risk injury.  4. Bruising and tenderness at the needle site are common side effects and will resolve in a few days.  5. Persistent numbness or new problems with movement should be communicated to the surgeon or the [facility] [phone] [facility] [phone]).

## 2020-11-20 ENCOUNTER — Encounter (HOSPITAL_BASED_OUTPATIENT_CLINIC_OR_DEPARTMENT_OTHER): Payer: Self-pay | Admitting: Surgery

## 2020-11-25 ENCOUNTER — Encounter: Payer: Self-pay | Admitting: *Deleted

## 2020-11-27 ENCOUNTER — Ambulatory Visit: Payer: Self-pay | Admitting: Surgery

## 2020-12-02 ENCOUNTER — Inpatient Hospital Stay: Payer: 59

## 2020-12-02 ENCOUNTER — Other Ambulatory Visit: Payer: Self-pay

## 2020-12-02 ENCOUNTER — Inpatient Hospital Stay: Payer: 59 | Attending: Oncology | Admitting: Oncology

## 2020-12-02 VITALS — BP 128/78 | HR 81 | Temp 97.3°F | Resp 15 | Ht 62.0 in | Wt 180.2 lb

## 2020-12-02 DIAGNOSIS — Z170 Estrogen receptor positive status [ER+]: Secondary | ICD-10-CM

## 2020-12-02 DIAGNOSIS — K219 Gastro-esophageal reflux disease without esophagitis: Secondary | ICD-10-CM | POA: Diagnosis not present

## 2020-12-02 DIAGNOSIS — Z9221 Personal history of antineoplastic chemotherapy: Secondary | ICD-10-CM | POA: Insufficient documentation

## 2020-12-02 DIAGNOSIS — Z801 Family history of malignant neoplasm of trachea, bronchus and lung: Secondary | ICD-10-CM | POA: Insufficient documentation

## 2020-12-02 DIAGNOSIS — Z8041 Family history of malignant neoplasm of ovary: Secondary | ICD-10-CM | POA: Diagnosis not present

## 2020-12-02 DIAGNOSIS — J45909 Unspecified asthma, uncomplicated: Secondary | ICD-10-CM | POA: Insufficient documentation

## 2020-12-02 DIAGNOSIS — Z171 Estrogen receptor negative status [ER-]: Secondary | ICD-10-CM | POA: Diagnosis not present

## 2020-12-02 DIAGNOSIS — Z793 Long term (current) use of hormonal contraceptives: Secondary | ICD-10-CM | POA: Diagnosis not present

## 2020-12-02 DIAGNOSIS — Z8042 Family history of malignant neoplasm of prostate: Secondary | ICD-10-CM | POA: Diagnosis not present

## 2020-12-02 DIAGNOSIS — Z79899 Other long term (current) drug therapy: Secondary | ICD-10-CM | POA: Insufficient documentation

## 2020-12-02 DIAGNOSIS — G629 Polyneuropathy, unspecified: Secondary | ICD-10-CM | POA: Diagnosis not present

## 2020-12-02 DIAGNOSIS — C50412 Malignant neoplasm of upper-outer quadrant of left female breast: Secondary | ICD-10-CM

## 2020-12-02 LAB — CBC WITH DIFFERENTIAL/PLATELET
Abs Immature Granulocytes: 0 10*3/uL (ref 0.00–0.07)
Basophils Absolute: 0 10*3/uL (ref 0.0–0.1)
Basophils Relative: 1 %
Eosinophils Absolute: 0.5 10*3/uL (ref 0.0–0.5)
Eosinophils Relative: 12 %
HCT: 34.9 % — ABNORMAL LOW (ref 36.0–46.0)
Hemoglobin: 11.5 g/dL — ABNORMAL LOW (ref 12.0–15.0)
Immature Granulocytes: 0 %
Lymphocytes Relative: 42 %
Lymphs Abs: 1.6 10*3/uL (ref 0.7–4.0)
MCH: 30.1 pg (ref 26.0–34.0)
MCHC: 33 g/dL (ref 30.0–36.0)
MCV: 91.4 fL (ref 80.0–100.0)
Monocytes Absolute: 0.4 10*3/uL (ref 0.1–1.0)
Monocytes Relative: 11 %
Neutro Abs: 1.3 10*3/uL — ABNORMAL LOW (ref 1.7–7.7)
Neutrophils Relative %: 34 %
Platelets: 271 10*3/uL (ref 150–400)
RBC: 3.82 MIL/uL — ABNORMAL LOW (ref 3.87–5.11)
RDW: 13.8 % (ref 11.5–15.5)
WBC: 3.7 10*3/uL — ABNORMAL LOW (ref 4.0–10.5)
nRBC: 0 % (ref 0.0–0.2)

## 2020-12-02 LAB — COMPREHENSIVE METABOLIC PANEL
ALT: 22 U/L (ref 0–44)
AST: 19 U/L (ref 15–41)
Albumin: 3.8 g/dL (ref 3.5–5.0)
Alkaline Phosphatase: 64 U/L (ref 38–126)
Anion gap: 8 (ref 5–15)
BUN: 10 mg/dL (ref 6–20)
CO2: 26 mmol/L (ref 22–32)
Calcium: 9 mg/dL (ref 8.9–10.3)
Chloride: 106 mmol/L (ref 98–111)
Creatinine, Ser: 0.83 mg/dL (ref 0.44–1.00)
GFR, Estimated: >60 mL/min (ref >60)
Glucose, Bld: 103 mg/dL — ABNORMAL HIGH (ref 70–99)
Potassium: 4 mmol/L (ref 3.5–5.1)
Sodium: 140 mmol/L (ref 135–145)
Total Bilirubin: 0.3 mg/dL (ref 0.3–1.2)
Total Protein: 7.5 g/dL (ref 6.5–8.1)

## 2020-12-02 NOTE — Progress Notes (Signed)
Jeanne Evans  [phone] Fax:([phone]) [phone]     ID: Jeanne Evans DOB: 1986/03/28  MR#: [id number]  IPJ#:[id number]  Patient Care Team: [name], MD as PCP - General (Obstetrics and Gynecology) Rockwell Germany, RN as Oncology Nurse Navigator Mauro Kaufmann, RN as Oncology Nurse Navigator Erroll Luna, MD as Consulting Physician (General Surgery) Tavius Turgeon, Virgie Dad, MD as Consulting Physician (Oncology) Kyung Rudd, MD as Consulting Physician (Radiation Oncology) Servando Salina, MD as Consulting Physician (Obstetrics and Gynecology) Chauncey Cruel, MD OTHER MD:  CHIEF COMPLAINT: 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  She is accompanied by her close friend  She received 4 doses of weekly carboplatin/paclitaxel before developing neuropathy and cytopenia, which led to discontinuation of neoadjuvant treatment.  She underwent breast MRI on 10/20/2020 showing: breast composition C; significant interval decrease in size of known malignancy in left breast; contiguous 3.8 cm non-mass-like enhancement extending between 2 biopsy marker clips concerning for anterior extent of disease; no abnormal-appearing lymph nodes.  She proceeded to left lumpectomy on 11/19/2020 under Dr. Brantley Stage. Pathology from the procedure ([id number]) showed: invasive ductal carcinoma, 1.4 cm, grade 3; superior margin focally involved by carcinoma.  All 6 biopsied lymph nodes were negative for carcinoma (0/6).   REVIEW OF SYSTEMS: Jeanne Evans did well with the surgery in the breast but has significant pain involving the left axilla, medial left upper arm, and actually neuropathic discomfort extending down the entire left arm into her hand.  She received some narcotics for this which worked initially but then caused  her to not be able to sleep so she was placed on gabapentin.  That is working some but she remains very uncomfortable and is clearly protecting the left upper extremity.  She says she does have a sling although she was not wearing it today.  She has had no fever no bleeding no swelling and no erythema.  She is aware that she will need additional surgery before starting radiation.  She is concerned that she will need to stay out of work longer than originally planned.  A detailed review of systems today was otherwise stable   COVID 19 VACCINATION STATUS: not vaccinated as of November 2021   HISTORY OF CURRENT ILLNESS: From the original intake note:  Jeanne Evans herself palpated a painful left breast lump. She underwent bilateral diagnostic mammography with tomography and left breast ultrasonography at [facility] on 05/12/2020 showing: breast density category B; palpable 2.7 cm oval hypoechoic mass in left breast at 2-3 o'clock; 2.2 cm left axillary lymph node/grouping of nodes.  Accordingly on 05/29/2020 she proceeded to biopsy of the left breast area in question. The pathology from this procedure ([id number]) showed: invasive ductal carcinoma, grade 3. Prognostic indicators significant for: estrogen receptor, 10% positive with weak staining intensity and progesterone receptor, 0% negative. Proliferation marker Ki67 at 95%. HER2 equivocal by immunohistochemistry (2+), but negative by fluorescent in situ hybridization with a signals ratio 1.77 and number per cell 2.65.  Biopsy of the left axillary lymph node in question was negative and concordant  The patient's subsequent history is as detailed below.   PAST MEDICAL HISTORY: Past Medical History:  Diagnosis Date  . Breast cancer ([place])   . Family history of ovarian cancer   . Family history of prostate cancer   . GERD (gastroesophageal reflux disease)   .  History of asthma    has albuterol, uses PRN  . History of heart murmur  in childhood   . History of multiple allergies   History of allergy related headaches   PAST SURGICAL HISTORY: Past Surgical History:  Procedure Laterality Date  . BREAST LUMPECTOMY WITH RADIOACTIVE SEED AND SENTINEL LYMPH NODE BIOPSY Left 11/19/2020   Procedure: LEFT BREAST LUMPECTOMY X 2 WITH RADIOACTIVE SEED AND SENTINEL LYMPH NODE MAPPING;  Surgeon: Erroll Luna, MD;  Location: [place];  Service: General;  Laterality: Left;  PECTORAL BLOCK  . PORTACATH PLACEMENT Right 06/18/2020   Procedure: INSERTION PORT-A-CATH WITH ULTRASOUND GUIDANCE;  Surgeon: Erroll Luna, MD;  Location: [place];  Service: General;  Laterality: Right;  She reports having a boil lanced from her perineal area   FAMILY HISTORY: Family History  Problem Relation Age of Onset  . Lung cancer Maternal Uncle        dx late 81s  . Prostate cancer Paternal Uncle        dx late 30s  . Ovarian cancer Paternal Grandmother        dx 45s  . Prostate cancer Paternal Grandfather        dx 30s  . Prostate cancer Paternal Uncle        dx early 43s  The patient's father is 51 and her mother 69, as of 05/2020.  The patient has two brothers and one sister. She reports ovarian cancer in her paternal grandmother, prostate cancer in her paternal grandfather and a paternal uncle, and lung cancer in a maternal uncle.   GYNECOLOGIC HISTORY:  No LMP recorded. (Menstrual status: IUD). Menarche: 35 years old Age at first live birth: 35 years old [place] P 2 LMP current, experiences spotting Contraceptive: Mirena IUD in place HRT n/a  Hysterectomy? no BSO? 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 She describes herself as single. She lives at home with her children, Jeanne Evans (age 25) and Jeanne Evans (age 29).  She is originally from [place] her home.    ADVANCED  DIRECTIVES: Not in place. She intends to name her mother, Jeanne Evans, as her HCPOA.  Jeanne Evans lives in [place] and can be reached at [phone].  The patient was given the appropriate documents to complete and notarized at her discretion at the time of her visit 06/04/2020   HEALTH MAINTENANCE: Social History   Tobacco Use  . Smoking status: Never Smoker  . Smokeless tobacco: Never Used  Substance Use Topics  . Alcohol use: Yes    Comment: occas  . Drug use: Yes    Types: Marijuana     Colonoscopy: n/a (age)  PAP: 04/2020  Bone density: n/a (age)   Allergies  Allergen Reactions  . Sulfa Antibiotics Hives    Current Outpatient Medications  Medication Sig Dispense Refill  . ibuprofen (ADVIL) 800 MG tablet Take 1 tablet (800 mg total) by mouth every 8 (eight) hours as needed. 30 tablet 0  . ibuprofen (ADVIL) 800 MG tablet Take 1 tablet (800 mg total) by mouth every 8 (eight) hours as needed. 30 tablet 0  . levonorgestrel (MIRENA) 20 MCG/24HR IUD 1 each by Intrauterine route once.    . magic mouthwash SOLN Take 5 mLs by mouth 4 (four) times daily as needed for mouth pain. 240 mL 1  . omeprazole (PRILOSEC) 40 MG capsule Take 1 capsule (  40 mg total) by mouth at bedtime. 7 capsule 0  . oxyCODONE (OXY IR/ROXICODONE) 5 MG immediate release tablet Take 1 tablet (5 mg total) by mouth every 6 (six) hours as needed for severe pain. 15 tablet 0  . prochlorperazine (COMPAZINE) 10 MG tablet Take 1 tablet (10 mg total) by mouth every 6 (six) hours as needed (Nausea or vomiting). 30 tablet 1  . venlafaxine XR (EFFEXOR-XR) 75 MG 24 hr capsule Take 1 capsule (75 mg total) by mouth daily with breakfast. 30 capsule 6  . zolmitriptan (ZOMIG) 5 MG tablet Take 1 tablet (5 mg total) by mouth as needed for migraine. 10 tablet 2   No current facility-administered medications for this visit.    OBJECTIVE: African-American woman in no acute distress Vitals:   12/02/20 1408  BP: 128/78  Pulse: 81   Resp: 15  Temp: (!) 97.3 F (36.3 C)  SpO2: 100%     Body mass index is 32.96 kg/m.   Wt Readings from Last 3 Encounters:  12/02/20 180 lb 3.2 oz (81.7 kg)  11/19/20 179 lb 10.8 oz (81.5 kg)  10/07/20 171 lb 6.4 oz (77.7 kg)  ECOG FS:1 - Symptomatic but completely ambulatory  Sclerae unicteric, EOMs intact Wearing a mask No cervical or supraclavicular adenopathy Lungs no rales or rhonchi Heart regular rate and rhythm Abd soft, nontender, positive bowel sounds MSK no focal spinal tenderness, no upper extremity lymphedema Neuro: nonfocal, well oriented, appropriate affect Breasts: The right breast is unremarkable.  The left breast is status post recent lumpectomy.  The cosmetic result is excellent.  The incisions are healing nicely, with no dehiscence erythema or swelling.  Both axillae are benign   LAB RESULTS:  CMP     Component Value Date/Time   NA 140 12/02/2020 1349   K 4.0 12/02/2020 1349   CL 106 12/02/2020 1349   CO2 26 12/02/2020 1349   GLUCOSE 103 (H) 12/02/2020 1349   BUN 10 12/02/2020 1349   CREATININE 0.83 12/02/2020 1349   CREATININE 0.92 06/04/2020 0830   CALCIUM 9.0 12/02/2020 1349   PROT 7.5 12/02/2020 1349   ALBUMIN 3.8 12/02/2020 1349   AST 19 12/02/2020 1349   AST 13 (L) 06/04/2020 0830   ALT 22 12/02/2020 1349   ALT 14 06/04/2020 0830   ALKPHOS 64 12/02/2020 1349   BILITOT 0.3 12/02/2020 1349   BILITOT 0.5 06/04/2020 0830   GFRNONAA >60 12/02/2020 1349   GFRNONAA >60 06/04/2020 0830   GFRAA >60 08/05/2020 0955   GFRAA >60 06/04/2020 0830    No results found for: TOTALPROTELP, ALBUMINELP, A1GS, A2GS, BETS, BETA2SER, GAMS, MSPIKE, SPEI  Lab Results  Component Value Date   WBC 3.7 (L) 12/02/2020   NEUTROABS 1.3 (L) 12/02/2020   HGB 11.5 (L) 12/02/2020   HCT 34.9 (L) 12/02/2020   MCV 91.4 12/02/2020   PLT 271 12/02/2020    No results found for: LABCA2  No components found for: [id number]  No results for input(s): INR in the last 168  hours.  No results found for: LABCA2  No results found for: HBZ169  No results found for: CVE938  No results found for: BOF751  No results found for: CA2729  No components found for: HGQUANT  No results found for: CEA1 / No results found for: CEA1   No results found for: AFPTUMOR  No results found for: CHROMOGRNA  No results found for: KPAFRELGTCHN, LAMBDASER, KAPLAMBRATIO (kappa/lambda light chains)  No results found for: HGBA, HGBA2QUANT, HGBFQUANT, HGBSQUAN (  Hemoglobinopathy evaluation)   No results found for: LDH  No results found for: IRON, TIBC, IRONPCTSAT (Iron and TIBC)  No results found for: FERRITIN  Urinalysis No results found for: COLORURINE, APPEARANCEUR, LABSPEC, PHURINE, GLUCOSEU, HGBUR, BILIRUBINUR, KETONESUR, PROTEINUR, UROBILINOGEN, NITRITE, LEUKOCYTESUR   STUDIES: NM Sentinel Node Inj-No Rpt (Breast)  Result Date: 11/19/2020 Sulfur colloid was injected by the nuclear medicine technologist for melanoma sentinel node.   MM Breast Surgical Specimen  Result Date: 11/19/2020 CLINICAL DATA:  Evaluate specimen EXAM: SPECIMEN RADIOGRAPH OF THE LEFT BREAST COMPARISON:  Previous exam(s). FINDINGS: Status post excision of the left breast. The radioactive seeds and biopsy marker clips are present, completely intact, and were marked for pathology. IMPRESSION: Specimen radiograph of the left breast. Electronically Signed   By: Dorise Bullion III M.D   On: 11/19/2020 11:58   MM LT RADIOACTIVE SEED LOC MAMMO GUIDE  Result Date: 11/18/2020 CLINICAL DATA:  Localization prior to surgery EXAM: MAMMOGRAPHIC GUIDED RADIOACTIVE SEED LOCALIZATION OF THE LEFT BREAST COMPARISON:  Previous exam(s). FINDINGS: Patient presents for radioactive seed localization prior to surgery. I met with the patient and we discussed the procedure of seed localization including benefits and alternatives. We discussed the high likelihood of a successful procedure. We discussed the risks of the  procedure including infection, bleeding, tissue injury and further surgery. We discussed the low dose of radioactivity involved in the procedure. Informed, written consent was given. The usual time-out protocol was performed immediately prior to the procedure. Using mammographic guidance, sterile technique, 1% lidocaine and an I-125 radioactive seed, the most posterior biopsy clip was localized using a lateral approach. The follow-up mammogram images confirm the seed in the expected location and were marked for Dr. Brantley Stage. Follow-up survey of the patient confirms presence of the radioactive seed. Order number of I-125 seed:  [id number]. Total activity:  9.983 millicuries reference Date: October 27, 2020 Using mammographic guidance, sterile technique, 1% lidocaine and an I-125 radioactive seed, the most anterior biopsy clip was localized using a lateral approach. The follow-up mammogram images confirm the seed in the expected location and were marked for Dr. Brantley Stage. Follow-up survey of the patient confirms presence of the radioactive seed. Order number of I-125 seed:  [id number]. Total activity:  6.734 millicuries reference Date: October 27, 2020 The patient tolerated the procedure well and was released from the [place]. She was given instructions regarding seed removal. IMPRESSION: Radioactive seed localization left breast at 2 sites. No apparent complications. Electronically Signed   By: Dorise Bullion III M.D   On: 11/18/2020 14:24   MM LT RAD SEED EA ADD LESION LOC MAMMO  Result Date: 11/18/2020 CLINICAL DATA:  Localization prior to surgery EXAM: MAMMOGRAPHIC GUIDED RADIOACTIVE SEED LOCALIZATION OF THE LEFT BREAST COMPARISON:  Previous exam(s). FINDINGS: Patient presents for radioactive seed localization prior to surgery. I met with the patient and we discussed the procedure of seed localization including benefits and alternatives. We discussed the high likelihood of a successful procedure. We  discussed the risks of the procedure including infection, bleeding, tissue injury and further surgery. We discussed the low dose of radioactivity involved in the procedure. Informed, written consent was given. The usual time-out protocol was performed immediately prior to the procedure. Using mammographic guidance, sterile technique, 1% lidocaine and an I-125 radioactive seed, the most posterior biopsy clip was localized using a lateral approach. The follow-up mammogram images confirm the seed in the expected location and were marked for Dr. Brantley Stage. Follow-up survey of the patient confirms presence  of the radioactive seed. Order number of I-125 seed:  [id number]. Total activity:  5.945 millicuries reference Date: October 27, 2020 Using mammographic guidance, sterile technique, 1% lidocaine and an I-125 radioactive seed, the most anterior biopsy clip was localized using a lateral approach. The follow-up mammogram images confirm the seed in the expected location and were marked for Dr. Brantley Stage. Follow-up survey of the patient confirms presence of the radioactive seed. Order number of I-125 seed:  [id number]. Total activity:  2.863 millicuries reference Date: October 27, 2020 The patient tolerated the procedure well and was released from the [facility]. She was given instructions regarding seed removal. IMPRESSION: Radioactive seed localization left breast at 2 sites. No apparent complications. Electronically Signed   By: Dorise Bullion III M.D   On: 11/18/2020 14:24     ELIGIBLE FOR AVAILABLE RESEARCH PROTOCOL: no  ASSESSMENT: 35 y.o. [place] woman status post left breast upper outer quadrant biopsy 05/29/2020 for a clinical T2 N0, stage IIB invasive ductal carcinoma, functionally triple negative, with an MIB-1 of 95%.  (1) genetics testing 06/23/2020 through the [facility] Multi-Cancer Panel found no deleterious mutations in AIP, ALK, APC, ATM, AXIN2,BAP1,  BARD1, BLM, BMPR1A, BRCA1, BRCA2, BRIP1, CASR,  CDC73, CDH1, CDK4, CDKN1B, CDKN1C, CDKN2A (p14ARF), CDKN2A (p16INK4a), CEBPA, CHEK2, CTNNA1, DICER1, DIS3L2, EGFR (c.2369C>T, p.Thr790Met variant only), EPCAM (Deletion/duplication testing only), FH, FLCN, GATA2, GPC3, GREM1 (Promoter region deletion/duplication testing only), HOXB13 (c.251G>A, p.Gly84Glu), HRAS, KIT, MAX, MEN1, MET, MITF (c.952G>A, p.Glu318Lys variant only), MLH1, MSH2, MSH3, MSH6, MUTYH, NBN, NF1, NF2, NTHL1, PALB2, PDGFRA, PHOX2B, PMS2, POLD1, POLE, POT1, PRKAR1A, PTCH1, PTEN, RAD50, RAD51C, RAD51D, RB1, RECQL4, RET, RNF43, RUNX1, SDHAF2, SDHA (sequence changes only), SDHB, SDHC, SDHD, SMAD4, SMARCA4, SMARCB1, SMARCE1, STK11, SUFU, TERC, TERT, TMEM127, TP53, TSC1, TSC2, VHL, WRN and WT1.   (2) neoadjuvant chemotherapy consisting of cyclophosphamide and doxorubicin in dose dense fashion x4 started 06/19/2020, completed 08/05/2020, followed by paclitaxel and carboplatin weekly x12 starting 08/26/2020, completing 4 doses 09/23/2020  (a) echocardiogram on 06/13/2020 showed an EF of 60-65%  (b) paclitaxel and carboplatin discontinued after 4 doses because of neuropathy and cytopenias  (3) status post left lumpectomy and sentinel lymph node sampling 11/19/2020 for a residual yp T1c ypN0 invasive ductal carcinoma involving the superior margin  (a) additional surgery pending  (4) postmastectomy radiation as appropriate: Consider capecitabine sensitization  (5) to start pembrolizumab/Keytruda after the completion of radiation   PLAN: Jeanne Evans had some residual tumor in the breast despite her neoadjuvant chemotherapy.  It is favorable that the lymph nodes were negative.  Nevertheless I think she will benefit from some neoadjuvant treatment.  After she gets her margins cleared she will proceed to radiation.  I will check with Dr. Isidore Moos her radiation oncologist whether she thinks we should add sensitizing capecitabine to the radiation treatments.  After radiation Jeanne Evans will start  Keytruda every 21 days.  We will do that a minimum of a year.  She has her port still in place which is useful.  Today we discussed very briefly some of the possible toxicities side effects and complications of this medication  Right now she is quite uncomfortable because of the pain in her left upper extremity.  The gabapentin is helping.  I think it would help if she took Aleve 220 mg together with Tylenol 500 mg.  She may repeat this up to 3 times a day, preferably with some food.  I think between that and the gabapentin she may be able to get enough pain control.  She will see me again in mid March unless she needs capecitabine in which case we will schedule a visit to discuss that medication in more detail  I will also set her up for port flush in 6 weeks.  Total encounter time 35 minutes.Sarajane Jews C. Termaine Roupp, MD 12/02/20 4:33 PM Medical Oncology and Hematology [facility] [place], [place] [address] Tel. [phone]    Fax. [phone]   I, Wilburn Mylar, am acting as scribe for Dr. Virgie Dad. Jeramey Lanuza.  I, Lurline Del MD, have reviewed the above documentation for accuracy and completeness, and I agree with the above.   *Total Encounter Time as defined by the Centers for Medicare and Medicaid Services includes, in addition to the face-to-face time of a patient visit (documented in the note above) non-face-to-face time: obtaining and reviewing outside history, ordering and reviewing medications, tests or procedures, care coordination (communications with other health care professionals or caregivers) and documentation in the medical record.

## 2020-12-03 ENCOUNTER — Encounter: Payer: Self-pay | Admitting: *Deleted

## 2020-12-03 ENCOUNTER — Telehealth: Payer: Self-pay | Admitting: Oncology

## 2020-12-03 DIAGNOSIS — Z170 Estrogen receptor positive status [ER+]: Secondary | ICD-10-CM

## 2020-12-03 DIAGNOSIS — C50412 Malignant neoplasm of upper-outer quadrant of left female breast: Secondary | ICD-10-CM

## 2020-12-03 NOTE — Telephone Encounter (Signed)
Scheduled appts per 1/18 los. Pt confirmed appt dates and times.  

## 2020-12-04 ENCOUNTER — Telehealth: Payer: Self-pay | Admitting: *Deleted

## 2020-12-04 NOTE — Telephone Encounter (Signed)
LVM for call back to schedule appointment with Dr. Moody. 

## 2020-12-05 ENCOUNTER — Encounter (HOSPITAL_BASED_OUTPATIENT_CLINIC_OR_DEPARTMENT_OTHER): Payer: Self-pay | Admitting: Surgery

## 2020-12-05 ENCOUNTER — Other Ambulatory Visit: Payer: Self-pay

## 2020-12-08 ENCOUNTER — Other Ambulatory Visit: Payer: Self-pay

## 2020-12-08 ENCOUNTER — Other Ambulatory Visit (HOSPITAL_COMMUNITY)
Admission: RE | Admit: 2020-12-08 | Discharge: 2020-12-08 | Disposition: A | Discharge status: Home / Self Care | Payer: 59 | Source: Ambulatory Visit | Attending: Surgery | Admitting: Surgery

## 2020-12-08 ENCOUNTER — Ambulatory Visit: Payer: 59 | Attending: Surgery | Admitting: Physical Therapy

## 2020-12-08 ENCOUNTER — Encounter: Payer: Self-pay | Admitting: Physical Therapy

## 2020-12-08 DIAGNOSIS — M25612 Stiffness of left shoulder, not elsewhere classified: Secondary | ICD-10-CM | POA: Diagnosis present

## 2020-12-08 DIAGNOSIS — R293 Abnormal posture: Secondary | ICD-10-CM | POA: Insufficient documentation

## 2020-12-08 DIAGNOSIS — C50412 Malignant neoplasm of upper-outer quadrant of left female breast: Secondary | ICD-10-CM

## 2020-12-08 DIAGNOSIS — Z01812 Encounter for preprocedural laboratory examination: Secondary | ICD-10-CM | POA: Insufficient documentation

## 2020-12-08 DIAGNOSIS — Z170 Estrogen receptor positive status [ER+]: Secondary | ICD-10-CM | POA: Diagnosis present

## 2020-12-08 DIAGNOSIS — Z483 Aftercare following surgery for neoplasm: Secondary | ICD-10-CM | POA: Insufficient documentation

## 2020-12-08 DIAGNOSIS — R600 Localized edema: Secondary | ICD-10-CM | POA: Diagnosis present

## 2020-12-08 DIAGNOSIS — Z20822 Contact with and (suspected) exposure to covid-19: Secondary | ICD-10-CM | POA: Insufficient documentation

## 2020-12-08 LAB — SARS CORONAVIRUS 2 (TAT 6-24 HRS): SARS Coronavirus 2: NEGATIVE

## 2020-12-08 NOTE — Therapy (Signed)
[place], [place], [address] Phone: [phone]   Fax:  [phone]  Physical Therapy Treatment  Patient Details  Name: Jeanne Evans MRN: [id number] Date of Birth: 09-21-1986 Referring Provider (PT): Dr. Erroll Luna   Encounter Date: 12/08/2020   PT End of Session - 12/08/20 1443    Visit Number 2    Number of Visits 10    Date for PT Re-Evaluation 01/19/21    PT Start Time 1410    PT Stop Time 1450    PT Time Calculation (min) 40 min    Activity Tolerance Patient tolerated treatment well    Behavior During Therapy [facility] for tasks assessed/performed           Past Medical History:  Diagnosis Date  . Breast cancer ([place])   . Family history of ovarian cancer   . Family history of prostate cancer   . GERD (gastroesophageal reflux disease)   . History of asthma    has albuterol, uses PRN  . History of heart murmur in childhood   . History of multiple allergies     Past Surgical History:  Procedure Laterality Date  . BREAST LUMPECTOMY WITH RADIOACTIVE SEED AND SENTINEL LYMPH NODE BIOPSY Left 11/19/2020   Procedure: LEFT BREAST LUMPECTOMY X 2 WITH RADIOACTIVE SEED AND SENTINEL LYMPH NODE MAPPING;  Surgeon: Erroll Luna, MD;  Location: [place];  Service: General;  Laterality: Left;  PECTORAL BLOCK  . PORTACATH PLACEMENT Right 06/18/2020   Procedure: INSERTION PORT-A-CATH WITH ULTRASOUND GUIDANCE;  Surgeon: Erroll Luna, MD;  Location: [place];  Service: General;  Laterality: Right;    There were no vitals filed for this visit.   Subjective Assessment - 12/08/20 1413    Subjective Patient underwent chemotherapy from 06/19/2020 - 09/23/2020. She stopped early due to neuropathy. She underwent left lumpectomy and sentinel node biopsy (6 negative nodes) on 11/19/2020 with re-excision schedule for 12/09/2020. She has had left axillary and upper arm region  described as nerve pain which is being treated with Gabapentin.    Pertinent History Patient was diagnosed on 05/12/2020 with left breast cancer. She underwent neoadjuvant chemotherapy 06/19/2020-09/23/2020 and had a left lumpectomy and sentinel node biopsy (6 negative nodes) on 11/19/2020.  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    Patient Stated Goals Decrease pain and improve shoulder ROM    Currently in Pain? Yes    Pain Score 7     Pain Location Axilla    Pain Orientation Left    Pain Descriptors / Indicators Burning    Pain Type Surgical pain;Neuropathic pain    Pain Onset 1 to 4 weeks ago    Pain Frequency Intermittent    Aggravating Factors  Laying on right side; moving arm    Pain Relieving Factors Medication    Multiple Pain Sites No              [facility] PT Assessment - 12/08/20 0001      Assessment   Medical Diagnosis s/p left lumpectomy and sentinel node biopsy    Referring Provider (PT) Dr. [name] [place]    Onset Date/Surgical Date 11/19/20    Hand Dominance Right    Prior Therapy Baselines      Precautions   Precautions Other (comment)    Precaution Comments recent surgery and left arm lymphedema risk      Restrictions   Weight Bearing Restrictions No  Balance Screen   Has the patient fallen in the past 6 months No    Has the patient had a decrease in activity level because of a fear of falling?  No    Is the patient reluctant to leave their home because of a fear of falling?  No      Home Environment   Living Environment Private residence    Living Arrangements Children   64 and 29 y.o. daughters   Available Help at Discharge Family   Her Mom is going to stay with her during treatment     Prior Function   Level of Independence Independent    Vocation Full time employment    Vocation Requirements [employer] She has not yet returned to walking      Cognition   Overall Cognitive Status Within Functional  Limits for tasks assessed      Observation/Other Assessments   Observations Edema present left lateral breast. Incisions appear to be healing well with no redness present. Palpable tenderness left breast and medial upper arm.      Posture/Postural Control   Posture/Postural Control Postural limitations    Postural Limitations Rounded Shoulders;Forward head;Increased lumbar lordosis      ROM / Strength   AROM / PROM / Strength AROM      AROM   AROM Assessment Site Shoulder    Right/Left Shoulder Left    Left Shoulder Extension 43 Degrees    Left Shoulder Flexion 109 Degrees    Left Shoulder ABduction 138 Degrees    Left Shoulder Internal Rotation 38 Degrees    Left Shoulder External Rotation 70 Degrees      Strength   Overall Strength Unable to assess;Due to precautions             LYMPHEDEMA/ONCOLOGY QUESTIONNAIRE - 12/08/20 0001      Type   Cancer Type Left breast cancer      Surgeries   Lumpectomy Date 11/19/20    Sentinel Lymph Node Biopsy Date 11/19/20    Other Surgery Date 12/09/20    Number Lymph Nodes Removed 6      Treatment   Active Chemotherapy Treatment No    Past Chemotherapy Treatment Yes    Date 09/23/20    Active Radiation Treatment No    Past Radiation Treatment No    Current Hormone Treatment No    Past Hormone Therapy No      What other symptoms do you have   Are you Having Heaviness or Tightness Yes    Are you having Pain Yes    Are you having pitting edema No    Is it Hard or Difficult finding clothes that fit No    Do you have infections No    Is there Decreased scar mobility No    Stemmer Sign No      Lymphedema Assessments   Lymphedema Assessments Upper extremities      Right Upper Extremity Lymphedema   10 cm Proximal to Olecranon Process 35.6 cm    Olecranon Process 28.2 cm    10 cm Proximal to Ulnar Styloid Process 23 cm    Just Proximal to Ulnar Styloid Process 16.5 cm    Across Hand at [employer] 19.8 cm    At [place] of  2nd Digit 6.4 cm      Left Upper Extremity Lymphedema   10 cm Proximal to Olecranon Process 34.8 cm    Olecranon  Process 26.8 cm    10 cm Proximal to Ulnar Styloid Process 21.9 cm    Just Proximal to Ulnar Styloid Process 16.1 cm    Across Hand at [employer] 19.8 cm    At [place] of 2nd Digit 6.3 cm              Quick Dash - 12/08/20 0001    Open a tight or new jar Moderate difficulty    Do heavy household chores (wash walls, wash floors) Moderate difficulty    Carry a shopping bag or briefcase Moderate difficulty    Wash your back Severe difficulty    Use a knife to cut food Mild difficulty    Recreational activities in which you take some force or impact through your arm, shoulder, or hand (golf, hammering, tennis) Unable    During the past week, to what extent has your arm, shoulder or hand problem interfered with your normal social activities with family, friends, neighbors, or groups? Extremely    During the past week, to what extent has your arm, shoulder or hand problem limited your work or other regular daily activities Modererately    Arm, shoulder, or hand pain. Severe    Tingling (pins and needles) in your arm, shoulder, or hand Extreme    Difficulty Sleeping So much difficuSo much difficulty, I can't sleep    DASH Score 70.45 %                               PT Long Term Goals - 12/08/20 1603      PT LONG TERM GOAL #1   Title Patient will demonstrate she has regained full shoulder ROM and function post operatively compared to baselines.    Time 6    Period Weeks    Status On-going    Target Date 01/19/21      PT LONG TERM GOAL #2   Title Patient will increase left shoulder active flexion to >/= 135 degrees for increased ease reaching.    Baseline 109 post op; 138 pre-op    Time 6    Period Weeks    Status New    Target Date 01/19/21      PT LONG TERM GOAL #3   Title Patient will increase left shoulder active abduction to >/= 165  degrees for increased ease reaching.    Baseline 138 post op; 163 pre-op    Time 6    Period Weeks    Status New    Target Date 01/19/21      PT LONG TERM GOAL #4   Title Patient will improve her DASH score to be </= 20 for improved overall upper extremity function.    Baseline 70.45 post op; 18.18 pre-op    Time 6    Period Weeks    Status New    Target Date 01/19/21      PT LONG TERM GOAL #5   Title Patient will report >/= 50% decrease in pain for increased tolerance of daily tasks.    Baseline Patient unable to tolerate any work tasks or home tasks currently.    Time 6    Period Weeks    Status New    Target Date 01/19/21                 Plan - 12/08/20 1446    Clinical Impression Statement Patient is here today s/p left lumpectomy  and sentinel node biopsy on 11/19/2020 with 6 negative nodes removed. She stopped neoadjuvant chemotherapy 09/23/2020 due to neuropathy. She is having a re-excision 12/09/2020 so PT is on hold for 2 weeks. She will return for PT to improve shoulder ROM and function, reduce swelling, and decrease pain in left axilla and upper arm.    PT Frequency 2x / week    PT Duration 4 weeks   Beginning in 2 weeks   PT Treatment/Interventions ADLs/Self Care Home Management;Therapeutic exercise;Patient/family education;Passive range of motion;Manual lymph drainage;Manual techniques;Scar mobilization    PT Next Visit Plan Begin PROM and desensitization to left upper arm; pt having re-excision 12/09/2020 so will begin PT in 2 weeks    PT Home Exercise Plan Post op shoulder ROM HEP    Consulted and Agree with Plan of Care Patient           Patient will benefit from skilled therapeutic intervention in order to improve the following deficits and impairments:  Postural dysfunction,Decreased range of motion,Impaired UE functional use,Pain,Decreased knowledge of precautions,Decreased scar mobility,Increased fascial restricitons  Visit Diagnosis: Malignant neoplasm  of upper-outer quadrant of left breast in female, estrogen receptor positive ([place]) - Plan: PT plan of care cert/re-cert  Abnormal posture - Plan: PT plan of care cert/re-cert  Aftercare following surgery for neoplasm - Plan: PT plan of care cert/re-cert  Stiffness of left shoulder, not elsewhere classified - Plan: PT plan of care cert/re-cert  Localized edema - Plan: PT plan of care cert/re-cert     Problem List Patient Active Problem List   Diagnosis Date Noted  . Drug-induced neutropenia ([place]) 09/30/2020  . Genetic testing 06/26/2020  . Family history of ovarian cancer   . Family history of prostate cancer   . Malignant neoplasm of upper-outer quadrant of left breast in female, estrogen receptor positive ([place]) 06/03/2020   Annia Friendly, PT 12/08/20 4:24 PM  [place], [place], [address] Phone: [phone]   Fax:  [phone]  Name: Jeanne Evans MRN: [id number] Date of Birth: 1986/11/09

## 2020-12-08 NOTE — H&P (Signed)
Jeanne Evans Appointment: 12/08/2020 10:00 AM Location: [facility] Patient #: [id number] DOB: Apr 13, 1986 Single / Language: Jeanne Evans / Race: Black or African American Female  History of Present Illness Jeanne Moores A. Myleen Brailsford MD; 12/08/2020 12:30 PM) Patient words: Patient returns 3 weeks after left breast lumpectomy for stage I left breast cancer. Her superior margin was positive therefore she is on the schedule for reexcision tomorrow. Her lymph nodes are otherwise negative as well as her other margins. She does note a smell with her incision. There is been no drainage, redness or discharge. It does feel full          FINAL MICROSCOPIC DIAGNOSIS:  A. BREAST, LEFT, LUMPECTOMY: - Invasive ductal carcinoma, 1.4 cm, grade 3 - Carcinoma focally involves the inked superior margin; other margins are more than 1 cm from carcinoma - Biopsy site changes - See oncology table  B. BREAST, LEFT ADDITIONAL ANTEROMEDIAL MARGIN, EXCISION: - Benign breast parenchyma with fibrocystic change - Negative for carcinoma  C. LYMPH NODE, LEFT AXILLARY, SENTINEL, EXCISION: - Lymph node, negative for carcinoma (0/1)  D. LYMPH NODE, LEFT AXILLARY, SENTINEL, EXCISION: - Lymph node, negative for carcinoma (0/1)  E. LYMPH NODE, LEFT AXILLARY, SENTINEL, EXCISION: - Lymph node, negative for carcinoma (0/1)  F. LYMPH NODE, LEFT AXILLARY, SENTINEL, EXCISION: - Lymph node, negative for carcinoma (0/1) - Multiple well-formed, nonnecrotizing granulomas, see comment  G. LYMPH NODE, LEFT AXILLARY, SENTINEL, EXCISION: - Lymph node, negative for carcinoma (0/1)  H. LYMPH NODE, LEFT AXILLARY, SENTINEL, EXCISION: - Lymph node, negative for carcinoma (0/1)    COMMENT:  F. Findings can be suggestive of sarcoidosis.    ONCOLOGY TABLE:  INVASIVE CARCINOMA OF THE BREAST: Resection  Procedure: Lumpectomy Specimen Laterality: Left Histologic Type: Invasive ductal carcinoma Histologic  Grade: Glandular (Acinar)/Tubular Differentiation: 3 Nuclear Pleomorphism: 3 Mitotic Rate: 3 Overall Grade: 3 Tumor Size: 1.4 cm Ductal Carcinoma In Situ: Not identified Tumor Extent: Limited to breast parenchyma Treatment Effect in the Breast: No known presurgical therapy Margins: The superior margin is focally involved by carcinoma Regional Lymph Nodes: Number of Lymph Nodes Examined: 6 Number of Sentinel Nodes Examined: 6 Number of Lymph Nodes with Macrometastases (>2 mm): 0 Number of Lymph Nodes with Micrometastases: 0 Number of Lymph Nodes with Isolated Tumor Cells (=0.2 mm or =200 cells): 0 Size of Largest Metastatic Deposit (mm): Not applicable Extranodal Extension: Not applicable Distant Metastasis: Distant Site(s) Involved: Not applicable Breast Biomarker Testing Performed on Previous Biopsy: Testing Performed on Case Number: [id number] Estrogen Receptor: 10%, weak staining intensity Progesterone Receptor: 0%, negative HER2: Negative (FISH) Ki-67: 95% Pathologic Stage Classification (pTNM, AJCC 8th Edition): pT1c, pN0 Representative Tumor Block: A2 Comment(s): None.  The patient is a 35 year old female.   Medication History (Armen Ferguson, CMA; 12/08/2020 10:31 AM) Methocarbamol (500MG Tablet, 1 Oral every six hours, as needed, Taken starting 11/19/2020) Active. Gabapentin (300MG Capsule, 1 (one) Oral three times daily, Taken starting 11/28/2020) Active. Prochlorperazine Maleate (10MG Tablet, Oral) Active. ZOLMitriptan (5MG Tablet, Oral) Active. Ibuprofen (800MG Tablet, Oral) Active. Albuterol Sulfate HFA (108 (90 Base)MCG/ACT Aerosol Soln, Inhalation) Active. Amoxicillin (500MG Capsule, Oral) Active. metroNIDAZOLE (500MG Tablet, Oral) Active. Ondansetron (4MG Tablet Disint, Oral) Active. valACYclovir HCl (500MG Tablet, Oral) Active. valACYclovir HCl (1GM  Tablet, Oral) Active. predniSONE (20MG Tablet, Oral) Active. Medications Reconciled     Physical Exam (Jeanne Bascomb A. Ellyn Rubiano MD; 12/08/2020 12:31 PM)  Breast Note: Left lumpectomy incision intact. Moderate seroma noted. Left axillary incision intact. Small seroma noted. No signs  of infection.  Neurologic Neurologic evaluation reveals -alert and oriented x 3 with no impairment of recent or remote memory. Mental Status-Normal.  Musculoskeletal Normal Exam - Left-Upper Extremity Strength Normal and Lower Extremity Strength Normal. Normal Exam - Right-Upper Extremity Strength Normal, Lower Extremity Weakness.    Assessment & Plan (Jeanne Stangler A. Kimberely Mccannon MD; 12/08/2020 12:31 PM)  POST-OPERATIVE STATE [phone]) Impression: Patient scheduled for reexcision tomorrow. Risks, benefits and rationale for reexcision discussed for positive margin. Expected postoperative complications, course and recovery discussed. Risk of lumpectomy include bleeding, infection, seroma, more surgery, use of seed/wire, wound care, cosmetic deformity and the need for other treatments, death , blood clots, death. Pt agrees to proceed.  Current Plans Pt Education - [facility] Free Text Education/Instructions: discussed with patient and provided information.

## 2020-12-08 NOTE — Progress Notes (Signed)

## 2020-12-09 ENCOUNTER — Ambulatory Visit (HOSPITAL_BASED_OUTPATIENT_CLINIC_OR_DEPARTMENT_OTHER): Payer: 59 | Admitting: Certified Registered"

## 2020-12-09 ENCOUNTER — Telehealth: Payer: Self-pay | Admitting: Licensed Clinical Social Worker

## 2020-12-09 ENCOUNTER — Other Ambulatory Visit: Payer: Self-pay

## 2020-12-09 ENCOUNTER — Encounter (HOSPITAL_BASED_OUTPATIENT_CLINIC_OR_DEPARTMENT_OTHER): Payer: Self-pay | Admitting: Surgery

## 2020-12-09 ENCOUNTER — Encounter (HOSPITAL_BASED_OUTPATIENT_CLINIC_OR_DEPARTMENT_OTHER)
Admission: RE | Disposition: A | Discharge status: Home / Self Care | Payer: Self-pay | Source: Home / Self Care | Attending: Surgery

## 2020-12-09 ENCOUNTER — Ambulatory Visit (HOSPITAL_BASED_OUTPATIENT_CLINIC_OR_DEPARTMENT_OTHER)
Admission: RE | Admit: 2020-12-09 | Discharge: 2020-12-09 | Disposition: A | Discharge status: Home / Self Care | Payer: 59 | Attending: Surgery | Admitting: Surgery

## 2020-12-09 DIAGNOSIS — C50912 Malignant neoplasm of unspecified site of left female breast: Secondary | ICD-10-CM | POA: Diagnosis not present

## 2020-12-09 DIAGNOSIS — Y838 Other surgical procedures as the cause of abnormal reaction of the patient, or of later complication, without mention of misadventure at the time of the procedure: Secondary | ICD-10-CM | POA: Insufficient documentation

## 2020-12-09 DIAGNOSIS — Z79899 Other long term (current) drug therapy: Secondary | ICD-10-CM | POA: Insufficient documentation

## 2020-12-09 DIAGNOSIS — Z170 Estrogen receptor positive status [ER+]: Secondary | ICD-10-CM | POA: Diagnosis not present

## 2020-12-09 DIAGNOSIS — M96843 Postprocedural seroma of a musculoskeletal structure following other procedure: Secondary | ICD-10-CM | POA: Diagnosis not present

## 2020-12-09 HISTORY — PX: RE-EXCISION OF BREAST LUMPECTOMY: SHX6048

## 2020-12-09 LAB — POCT PREGNANCY, URINE: Preg Test, Ur: NEGATIVE

## 2020-12-09 SURGERY — EXCISION, LESION, BREAST
Anesthesia: General | Site: Breast | Laterality: Left

## 2020-12-09 MED ORDER — BUPIVACAINE HCL (PF) 0.25 % IJ SOLN
INTRAMUSCULAR | Status: DC | PRN
  Administered 2020-12-09: 7 mL

## 2020-12-09 MED ORDER — 0.9 % SODIUM CHLORIDE (POUR BTL) OPTIME
TOPICAL | Status: DC | PRN
Start: 2020-12-09 — End: 2020-12-09
  Administered 2020-12-09: 150 mL

## 2020-12-09 MED ORDER — CEFAZOLIN SODIUM-DEXTROSE 2-4 GM/100ML-% IV SOLN
2.0000 g | INTRAVENOUS | Status: AC
  Administered 2020-12-09: 2 g via INTRAVENOUS

## 2020-12-09 MED ORDER — KETOROLAC TROMETHAMINE 30 MG/ML IJ SOLN
30.0000 mg | Freq: Four times a day (QID) | INTRAMUSCULAR | Status: DC | PRN
  Administered 2020-12-09: 30 mg via INTRAVENOUS

## 2020-12-09 MED ORDER — PROMETHAZINE HCL 25 MG/ML IJ SOLN: 6.2500 mg | INTRAMUSCULAR | Status: DC | PRN

## 2020-12-09 MED ORDER — ACETAMINOPHEN 500 MG PO TABS
ORAL_TABLET | ORAL | Status: AC
  Filled 2020-12-09: qty 2

## 2020-12-09 MED ORDER — LIDOCAINE 2% (20 MG/ML) 5 ML SYRINGE
INTRAMUSCULAR | Status: AC
  Filled 2020-12-09: qty 5

## 2020-12-09 MED ORDER — DEXAMETHASONE SODIUM PHOSPHATE 10 MG/ML IJ SOLN
INTRAMUSCULAR | Status: AC
  Filled 2020-12-09: qty 1

## 2020-12-09 MED ORDER — HYDROCODONE-ACETAMINOPHEN 5-325 MG PO TABS: 1.0000 | ORAL_TABLET | Freq: Four times a day (QID) | ORAL | 0 refills | Status: DC | PRN

## 2020-12-09 MED ORDER — LIDOCAINE HCL (CARDIAC) PF 100 MG/5ML IV SOSY
PREFILLED_SYRINGE | INTRAVENOUS | Status: DC | PRN
  Administered 2020-12-09: 80 mg via INTRAVENOUS

## 2020-12-09 MED ORDER — BUPIVACAINE HCL (PF) 0.25 % IJ SOLN
INTRAMUSCULAR | Status: AC
  Filled 2020-12-09: qty 30

## 2020-12-09 MED ORDER — MIDAZOLAM HCL 2 MG/2ML IJ SOLN
INTRAMUSCULAR | Status: AC
  Filled 2020-12-09: qty 2

## 2020-12-09 MED ORDER — SCOPOLAMINE 1 MG/3DAYS TD PT72
1.0000 | MEDICATED_PATCH | Freq: Once | TRANSDERMAL | Status: DC
  Administered 2020-12-09: 1.5 mg via TRANSDERMAL

## 2020-12-09 MED ORDER — ACETAMINOPHEN 500 MG PO TABS
1000.0000 mg | ORAL_TABLET | Freq: Once | ORAL | Status: AC
  Administered 2020-12-09: 1000 mg via ORAL

## 2020-12-09 MED ORDER — CEFAZOLIN SODIUM-DEXTROSE 2-4 GM/100ML-% IV SOLN
INTRAVENOUS | Status: AC
  Filled 2020-12-09: qty 100

## 2020-12-09 MED ORDER — CHLORHEXIDINE GLUCONATE CLOTH 2 % EX PADS: 6.0000 | MEDICATED_PAD | Freq: Once | CUTANEOUS | Status: DC

## 2020-12-09 MED ORDER — MIDAZOLAM HCL 5 MG/5ML IJ SOLN
INTRAMUSCULAR | Status: DC | PRN
  Administered 2020-12-09: 2 mg via INTRAVENOUS

## 2020-12-09 MED ORDER — ONDANSETRON HCL 4 MG/2ML IJ SOLN
INTRAMUSCULAR | Status: AC
  Filled 2020-12-09: qty 2

## 2020-12-09 MED ORDER — KETOROLAC TROMETHAMINE 30 MG/ML IJ SOLN
INTRAMUSCULAR | Status: AC
  Filled 2020-12-09: qty 1

## 2020-12-09 MED ORDER — ONDANSETRON HCL 4 MG/2ML IJ SOLN
INTRAMUSCULAR | Status: DC | PRN
  Administered 2020-12-09: 4 mg via INTRAVENOUS

## 2020-12-09 MED ORDER — PROPOFOL 10 MG/ML IV BOLUS
INTRAVENOUS | Status: DC | PRN
  Administered 2020-12-09: 30 mg via INTRAVENOUS
  Administered 2020-12-09: 170 mg via INTRAVENOUS

## 2020-12-09 MED ORDER — FENTANYL CITRATE (PF) 100 MCG/2ML IJ SOLN
INTRAMUSCULAR | Status: AC
  Filled 2020-12-09: qty 2

## 2020-12-09 MED ORDER — DEXAMETHASONE SODIUM PHOSPHATE 10 MG/ML IJ SOLN
INTRAMUSCULAR | Status: DC | PRN
  Administered 2020-12-09: 10 mg via INTRAVENOUS

## 2020-12-09 MED ORDER — HYDROMORPHONE HCL 1 MG/ML IJ SOLN
0.5000 mg | INTRAMUSCULAR | Status: DC | PRN
  Administered 2020-12-09 (×2): 0.5 mg via INTRAVENOUS

## 2020-12-09 MED ORDER — HYDROMORPHONE HCL 1 MG/ML IJ SOLN
INTRAMUSCULAR | Status: AC
  Filled 2020-12-09: qty 0.5

## 2020-12-09 MED ORDER — LACTATED RINGERS IV SOLN: INTRAVENOUS | Status: DC

## 2020-12-09 MED ORDER — SCOPOLAMINE 1 MG/3DAYS TD PT72
MEDICATED_PATCH | TRANSDERMAL | Status: AC
  Filled 2020-12-09: qty 1

## 2020-12-09 MED ORDER — FENTANYL CITRATE (PF) 100 MCG/2ML IJ SOLN
25.0000 ug | INTRAMUSCULAR | Status: DC | PRN
  Administered 2020-12-09 (×3): 50 ug via INTRAVENOUS

## 2020-12-09 MED ORDER — OXYCODONE HCL 5 MG PO TABS: 5.0000 mg | ORAL_TABLET | Freq: Once | ORAL | Status: DC

## 2020-12-09 MED ORDER — FENTANYL CITRATE (PF) 100 MCG/2ML IJ SOLN
INTRAMUSCULAR | Status: DC | PRN
  Administered 2020-12-09 (×2): 25 ug via INTRAVENOUS
  Administered 2020-12-09: 50 ug via INTRAVENOUS

## 2020-12-09 SURGICAL SUPPLY — 52 items
APPLIER CLIP 9.375 MED OPEN (MISCELLANEOUS) ×3
BINDER BREAST LRG (GAUZE/BANDAGES/DRESSINGS) ×3 IMPLANT
BINDER BREAST MEDIUM (GAUZE/BANDAGES/DRESSINGS) IMPLANT
BINDER BREAST XLRG (GAUZE/BANDAGES/DRESSINGS) IMPLANT
BINDER BREAST XXLRG (GAUZE/BANDAGES/DRESSINGS) IMPLANT
BLADE SURG 15 STRL LF DISP TIS (BLADE) ×1 IMPLANT
BLADE SURG 15 STRL SS (BLADE) ×2
CANISTER SUCT 1200ML W/VALVE (MISCELLANEOUS) ×3 IMPLANT
CHLORAPREP W/TINT 26 (MISCELLANEOUS) ×3 IMPLANT
CLIP APPLIE 9.375 MED OPEN (MISCELLANEOUS) ×1 IMPLANT
COVER BACK TABLE 60X90IN (DRAPES) ×3 IMPLANT
COVER MAYO STAND STRL (DRAPES) ×3 IMPLANT
COVER WAND RF STERILE (DRAPES) IMPLANT
DECANTER SPIKE VIAL GLASS SM (MISCELLANEOUS) ×3 IMPLANT
DERMABOND ADVANCED (GAUZE/BANDAGES/DRESSINGS) ×2
DERMABOND ADVANCED .7 DNX12 (GAUZE/BANDAGES/DRESSINGS) ×1 IMPLANT
DRAPE LAPAROSCOPIC ABDOMINAL (DRAPES) IMPLANT
DRAPE LAPAROTOMY 100X72 PEDS (DRAPES) ×3 IMPLANT
DRAPE UTILITY XL STRL (DRAPES) ×3 IMPLANT
ELECT COATED BLADE 2.86 ST (ELECTRODE) ×3 IMPLANT
ELECT REM PT RETURN 9FT ADLT (ELECTROSURGICAL) ×3
ELECTRODE REM PT RTRN 9FT ADLT (ELECTROSURGICAL) ×1 IMPLANT
GLOVE BIO SURGEON STRL SZ7.5 (GLOVE) ×3 IMPLANT
GLOVE ECLIPSE 8.0 STRL XLNG CF (GLOVE) ×3 IMPLANT
GLOVE SRG 8 PF TXTR STRL LF DI (GLOVE) ×2 IMPLANT
GLOVE SURG ENC MOIS LTX SZ6.5 (GLOVE) ×3 IMPLANT
GLOVE SURG SYN 7.0 (GLOVE) ×3 IMPLANT
GLOVE SURG UNDER POLY LF SZ6.5 (GLOVE) ×3 IMPLANT
GLOVE SURG UNDER POLY LF SZ7 (GLOVE) ×3 IMPLANT
GLOVE SURG UNDER POLY LF SZ8 (GLOVE) ×4
GOWN STRL REUS W/ TWL LRG LVL3 (GOWN DISPOSABLE) ×2 IMPLANT
GOWN STRL REUS W/ TWL XL LVL3 (GOWN DISPOSABLE) ×2 IMPLANT
GOWN STRL REUS W/TWL LRG LVL3 (GOWN DISPOSABLE) ×4
GOWN STRL REUS W/TWL XL LVL3 (GOWN DISPOSABLE) ×4
HEMOSTAT ARISTA ABSORB 3G PWDR (HEMOSTASIS) IMPLANT
KIT MARKER MARGIN INK (KITS) ×3 IMPLANT
NEEDLE HYPO 25X1 1.5 SAFETY (NEEDLE) ×3 IMPLANT
NS IRRIG 1000ML POUR BTL (IV SOLUTION) ×3 IMPLANT
PACK BASIN DAY SURGERY FS (CUSTOM PROCEDURE TRAY) ×3 IMPLANT
PENCIL SMOKE EVACUATOR (MISCELLANEOUS) ×3 IMPLANT
SLEEVE SCD COMPRESS KNEE MED (MISCELLANEOUS) ×3 IMPLANT
SPONGE LAP 4X18 RFD (DISPOSABLE) ×3 IMPLANT
STAPLER VISISTAT 35W (STAPLE) IMPLANT
SUT MON AB 4-0 PC3 18 (SUTURE) ×3 IMPLANT
SUT SILK 2 0 SH (SUTURE) IMPLANT
SUT VICRYL 3-0 CR8 SH (SUTURE) ×3 IMPLANT
SYR CONTROL 10ML LL (SYRINGE) ×3 IMPLANT
TOWEL GREEN STERILE FF (TOWEL DISPOSABLE) ×6 IMPLANT
TRAY FAXITRON CT DISP (TRAY / TRAY PROCEDURE) IMPLANT
TUBE CONNECTING 20'X1/4 (TUBING) ×1
TUBE CONNECTING 20X1/4 (TUBING) ×2 IMPLANT
YANKAUER SUCT BULB TIP NO VENT (SUCTIONS) ×3 IMPLANT

## 2020-12-09 NOTE — Anesthesia Preprocedure Evaluation (Addendum)
Anesthesia Evaluation  Patient identified by MRN, date of birth, ID band Patient awake    Reviewed: Allergy & Precautions, NPO status , Patient's Chart, lab work & pertinent test results  Airway Mallampati: I  TM Distance: >3 FB Neck ROM: Full    Dental  (+) Teeth Intact, Dental Advisory Given   Pulmonary asthma ,    Pulmonary exam normal breath sounds clear to auscultation       Cardiovascular negative cardio ROS Normal cardiovascular exam Rhythm:Regular Rate:Normal     Neuro/Psych negative neurological ROS     GI/Hepatic Neg liver ROS, GERD  ,  Endo/Other  Obesity   Renal/GU negative Renal ROS     Musculoskeletal negative musculoskeletal ROS (+)   Abdominal   Peds  Hematology  (+) Blood dyscrasia, anemia ,   Anesthesia Other Findings Day of surgery medications reviewed with the patient.  Breast cancer   Reproductive/Obstetrics                            Anesthesia Physical Anesthesia Plan  ASA: II  Anesthesia Plan: General   Post-op Pain Management:    Induction: Intravenous  PONV Risk Score and Plan: 3 and Midazolam, Dexamethasone, Ondansetron and Scopolamine patch - Pre-op  Airway Management Planned: LMA  Additional Equipment:   Intra-op Plan:   Post-operative Plan: Extubation in OR  Informed Consent: I have reviewed the patients History and Physical, chart, labs and discussed the procedure including the risks, benefits and alternatives for the proposed anesthesia with the patient or authorized representative who has indicated his/her understanding and acceptance.     Dental advisory given  Plan Discussed with: CRNA  Anesthesia Plan Comments: (Risks/benefits of general anesthesia discussed with patient including risk of damage to teeth, lips, gum, and tongue, nausea/vomiting, allergic reactions to medications, and the possibility of heart attack, stroke and death.   All patient questions answered.  Patient wishes to proceed.)        Anesthesia Quick Evaluation

## 2020-12-09 NOTE — Transfer of Care (Signed)
Immediate Anesthesia Transfer of Care Note  Patient: Jeanne Evans  Procedure(s) Performed: RE-EXCISION OF LEFT BREAST LUMPECTOMY (Left Breast)  Patient Location: PACU  Anesthesia Type:General  Level of Consciousness: drowsy  Airway & Oxygen Therapy: Patient Spontanous Breathing and Patient connected to face mask oxygen  Post-op Assessment: Report given to RN and Post -op Vital signs reviewed and stable  Post vital signs: Reviewed and stable  Last Vitals:  Vitals Value Taken Time  BP 181/107 12/09/20 1015  Temp    Pulse 93 12/09/20 1015  Resp 7 12/09/20 1015  SpO2 100 % 12/09/20 1015  Vitals shown include unvalidated device data.  Last Pain:  Vitals:   12/09/20 0832  TempSrc: Oral  PainSc: 7       Patients Stated Pain Goal: 5 (50/53/97 6734)  Complications: No complications documented.

## 2020-12-09 NOTE — Interval H&P Note (Signed)
History and Physical Interval Note:  12/09/2020 9:13 AM  Jeanne Evans  has presented today for surgery, with the diagnosis of LEFT BREAST CANCER.  The various methods of treatment have been discussed with the patient and family. After consideration of risks, benefits and other options for treatment, the patient has consented to  Procedure(s): RE-EXCISION OF LEFT BREAST LUMPECTOMY (Left) as a surgical intervention.  The patient's history has been reviewed, patient examined, no change in status, stable for surgery.  I have reviewed the patient's chart and labs.  Questions were answered to the patient's satisfaction.     [place]

## 2020-12-09 NOTE — Telephone Encounter (Signed)
Attempted to contact patient re: resources for counseling for her children. No answer, left generic VM with request to call back as voicemail not identified.  [facility] [phone]) is primary recommendation.   Christeen Douglas, LCSW

## 2020-12-09 NOTE — Anesthesia Postprocedure Evaluation (Signed)
Anesthesia Post Note  Patient: Jeanne Evans  Procedure(s) Performed: RE-EXCISION OF LEFT BREAST LUMPECTOMY (Left Breast)     Patient location during evaluation: PACU Anesthesia Type: General Level of consciousness: awake and alert Pain management: pain level controlled Vital Signs Assessment: post-procedure vital signs reviewed and stable Respiratory status: spontaneous breathing, nonlabored ventilation, respiratory function stable and patient connected to nasal cannula oxygen Cardiovascular status: blood pressure returned to baseline and stable Postop Assessment: no apparent nausea or vomiting Anesthetic complications: no   No complications documented.  Last Vitals:  Vitals:   12/09/20 1145 12/09/20 1223  BP: (!) 138/94 (!) 151/93  Pulse: 98 85  Resp: 12 16  Temp:  (!) 36.4 C  SpO2: 94% 97%    Last Pain:  Vitals:   12/09/20 1223  TempSrc:   PainSc: [place]

## 2020-12-09 NOTE — Telephone Encounter (Signed)
-----   Message from Toribio Harbour, PT sent at 12/08/2020  2:28 PM EST ----- Regarding: [facility] Hi! I'm seeing Lajoya for a follow up and she's telling me that her girls (ages 55 and 62) are having a hard time with her diagnosis. Can you refer them to someone? I thought of [facility] but maybe there's something better? Can you call her? Thanks, Inez Catalina

## 2020-12-09 NOTE — Anesthesia Procedure Notes (Signed)
Procedure Name: LMA Insertion Performed by: Brewer, Megan S, CRNA Pre-anesthesia Checklist: Patient identified, Emergency Drugs available, Suction available and Patient being monitored Patient Re-evaluated:Patient Re-evaluated prior to induction Oxygen Delivery Method: Circle System Utilized Preoxygenation: Pre-oxygenation with 100% oxygen Induction Type: IV induction Ventilation: Mask ventilation without difficulty LMA: LMA inserted LMA Size: 4.0 Number of attempts: 1 Airway Equipment and Method: Bite block Placement Confirmation: positive ETCO2 Tube secured with: Tape Dental Injury: Teeth and Oropharynx as per pre-operative assessment  Comments: Eyes taped prior to insertion       

## 2020-12-09 NOTE — Discharge Instructions (Signed)
[facility] °Office Phone Number [phone] ° °BREAST BIOPSY/ PARTIAL MASTECTOMY: POST OP INSTRUCTIONS ° °Always review your discharge instruction sheet given to you by the facility where your surgery was performed. ° °IF YOU HAVE DISABILITY OR FAMILY LEAVE FORMS, YOU MUST BRING THEM TO THE OFFICE FOR PROCESSING.  DO NOT GIVE THEM TO YOUR DOCTOR. ° °1. A prescription for pain medication may be given to you upon discharge.  Take your pain medication as prescribed, if needed.  If narcotic pain medicine is not needed, then you may take acetaminophen (Tylenol) or ibuprofen (Advil) as needed. °2. Take your usually prescribed medications unless otherwise directed °3. If you need a refill on your pain medication, please contact your pharmacy.  They will contact our office to request authorization.  Prescriptions will not be filled after 5pm or on week-ends. °4. You should eat very light the first 24 hours after surgery, such as soup, crackers, pudding, etc.  Resume your normal diet the day after surgery. °5. Most patients will experience some swelling and bruising in the breast.  Ice packs and a good support bra will help.  Swelling and bruising can take several days to resolve.  °6. It is common to experience some constipation if taking pain medication after surgery.  Increasing fluid intake and taking a stool softener will usually help or prevent this problem from occurring.  A mild laxative (Milk of Magnesia or Miralax) should be taken according to package directions if there are no bowel movements after 48 hours. °7. Unless discharge instructions indicate otherwise, you may remove your bandages 24-48 hours after surgery, and you may shower at that time.  You may have steri-strips (small skin tapes) in place directly over the incision.  These strips should be left on the skin for 7-10 days.  If your surgeon used skin glue on the incision, you may shower in 24 hours.  The glue will flake off over the  next 2-3 weeks.  Any sutures or staples will be removed at the office during your follow-up visit. °8. ACTIVITIES:  You may resume regular daily activities (gradually increasing) beginning the next day.  Wearing a good support bra or sports bra minimizes pain and swelling.  You may have sexual intercourse when it is comfortable. °a. You may drive when you no longer are taking prescription pain medication, you can comfortably wear a seatbelt, and you can safely maneuver your car and apply brakes. °b. RETURN TO WORK:  ______________________________________________________________________________________ °9. You should see your doctor in the office for a follow-up appointment approximately two weeks after your surgery.  Your doctor’s nurse will typically make your follow-up appointment when she calls you with your pathology report.  Expect your pathology report 2-3 business days after your surgery.  You may call to check if you do not hear from us after three days. °10. OTHER INSTRUCTIONS: _______________________________________________________________________________________________ _____________________________________________________________________________________________________________________________________ °_____________________________________________________________________________________________________________________________________ °_____________________________________________________________________________________________________________________________________ ° °WHEN TO CALL YOUR DOCTOR: °1. Fever over 101.0 °2. Nausea and/or vomiting. °3. Extreme swelling or bruising. °4. Continued bleeding from incision. °5. Increased pain, redness, or drainage from the incision. ° °The clinic staff is available to answer your questions during regular business hours.  Please don’t hesitate to call and ask to speak to one of the nurses for clinical concerns.  If you have a medical emergency, go to the nearest  emergency room or call 911.  A surgeon from [facility] is always on call at the hospital. ° °For further questions, please visit [email or web address]  ° ° °  No Tylenol until after 3pm today if needed  Post Anesthesia Home Care Instructions  Activity: Get plenty of rest for the remainder of the day. A responsible individual must stay with you for 24 hours following the procedure.  For the next 24 hours, DO NOT: -Drive a car -Paediatric nurse -Drink alcoholic beverages -Take any medication unless instructed by your physician -Make any legal decisions or sign important papers.  Meals: Start with liquid foods such as gelatin or soup. Progress to regular foods as tolerated. Avoid greasy, spicy, heavy foods. If nausea and/or vomiting occur, drink only clear liquids until the nausea and/or vomiting subsides. Call your physician if vomiting continues.  Special Instructions/Symptoms: Your throat may feel dry or sore from the anesthesia or the breathing tube placed in your throat during surgery. If this causes discomfort, gargle with warm salt water. The discomfort should disappear within 24 hours.  If you had a scopolamine patch placed behind your ear for the management of post- operative nausea and/or vomiting:  1. The medication in the patch is effective for 72 hours, after which it should be removed.  Wrap patch in a tissue and discard in the trash. Wash hands thoroughly with soap and water. 2. You may remove the patch earlier than 72 hours if you experience unpleasant side effects which may include dry mouth, dizziness or visual disturbances. 3. Avoid touching the patch. Wash your hands with soap and water after contact with the patch.

## 2020-12-09 NOTE — Op Note (Signed)
Preoperative diagnosis: Left breast cancer stage I  Postoperative diagnosis: Same  Procedure: Reexcision left breast lumpectomy  Surgeon: Dr. Erroll Luna, MD  Assistant: Dr. Melony Overly MD  IV fluids: Per anesthesia record  Specimen: Superior margin oriented with ink to pathology  Drains: None  Anesthesia: General with 0.25% Marcaine plain  Indications for procedure the patient is a 35 year old female who is status post left breast lumpectomy for stage I breast cancer.  The superior margin was involved therefore she returns today for reexcision after discussing all of her surgical options.  Risk, benefits and long-term expectations as well as the need for reexcision discussed with her today.  We discussed the cosmesis as well.The procedure has been discussed with the patient. Alternatives to surgery have been discussed with the patient.  Risks of surgery include bleeding,  Infection,  Seroma formation, death,  and the need for further surgery.   The patient understands and wishes to proceed.   Description of procedure: The patient was met in holding area and questions were answered.  Left breast was marked as the correct site.  She was then taken back to the operating room placed supine upon the OR table.  The pathology report was available for review.  After induction of general esthesia the left breast was prepped and draped in a sterile fashion and timeout was performed.  Proper patient, site and procedure were verified.  The lumpectomy cavity was opened.  The seroma evacuated.  The superior margin was identified and excised with cautery.  This was oriented with ink and sent to pathology.  Hemostasis was achieved.  Clips were used to mark the reexcision site.  Cavity closed with 3-0 Vicryl and 4-0 Monocryl.  Dermabond applied.  All counts were found to be correct.  Patient was awoke extubated taken to recovery in satisfactory condition.

## 2020-12-10 ENCOUNTER — Encounter (HOSPITAL_BASED_OUTPATIENT_CLINIC_OR_DEPARTMENT_OTHER): Payer: Self-pay | Admitting: Surgery

## 2020-12-10 LAB — SURGICAL PATHOLOGY

## 2020-12-11 ENCOUNTER — Telehealth: Payer: Self-pay | Admitting: Licensed Clinical Social Worker

## 2020-12-11 NOTE — Telephone Encounter (Signed)
Received return call from patient for information on counseling for her kids. CSW shared information on [facility]. Patient will call them to set up appointments. CSW encouraged patient to call back if this connection does not work or if she has any other needs.   Christeen Douglas, LCSW

## 2020-12-15 ENCOUNTER — Encounter: Payer: Self-pay | Admitting: *Deleted

## 2020-12-23 ENCOUNTER — Other Ambulatory Visit: Payer: Self-pay

## 2020-12-23 ENCOUNTER — Ambulatory Visit: Payer: 59 | Attending: Surgery

## 2020-12-23 DIAGNOSIS — M25612 Stiffness of left shoulder, not elsewhere classified: Secondary | ICD-10-CM | POA: Diagnosis present

## 2020-12-23 DIAGNOSIS — R293 Abnormal posture: Secondary | ICD-10-CM

## 2020-12-23 DIAGNOSIS — Z483 Aftercare following surgery for neoplasm: Secondary | ICD-10-CM | POA: Diagnosis present

## 2020-12-23 DIAGNOSIS — C50412 Malignant neoplasm of upper-outer quadrant of left female breast: Secondary | ICD-10-CM

## 2020-12-23 DIAGNOSIS — Z170 Estrogen receptor positive status [ER+]: Secondary | ICD-10-CM | POA: Diagnosis present

## 2020-12-23 DIAGNOSIS — R600 Localized edema: Secondary | ICD-10-CM | POA: Diagnosis present

## 2020-12-23 NOTE — Therapy (Signed)
[place], [place], [address] Phone: [phone]   Fax:  [phone]  Physical Therapy Treatment  Patient Details  Name: Jeanne Evans MRN: [id number] Date of Birth: 15-Mar-1986 Referring Provider (PT): Dr. Erroll Luna   Encounter Date: 12/23/2020   PT End of Session - 12/23/20 1113    Visit Number 3    Number of Visits 10    Date for PT Re-Evaluation 01/19/21    PT Start Time 1026    PT Stop Time 1100    PT Time Calculation (min) 34 min    Activity Tolerance Patient tolerated treatment well    Behavior During Therapy [facility] for tasks assessed/performed           Past Medical History:  Diagnosis Date  . Breast cancer ([place])   . Family history of ovarian cancer   . Family history of prostate cancer   . GERD (gastroesophageal reflux disease)   . History of asthma    has albuterol, uses PRN  . History of heart murmur in childhood   . History of multiple allergies     Past Surgical History:  Procedure Laterality Date  . BREAST LUMPECTOMY WITH RADIOACTIVE SEED AND SENTINEL LYMPH NODE BIOPSY Left 11/19/2020   Procedure: LEFT BREAST LUMPECTOMY X 2 WITH RADIOACTIVE SEED AND SENTINEL LYMPH NODE MAPPING;  Surgeon: Erroll Luna, MD;  Location: [place];  Service: General;  Laterality: Left;  PECTORAL BLOCK  . PORTACATH PLACEMENT Right 06/18/2020   Procedure: INSERTION PORT-A-CATH WITH ULTRASOUND GUIDANCE;  Surgeon: Erroll Luna, MD;  Location: [place];  Service: General;  Laterality: Right;  . RE-EXCISION OF BREAST LUMPECTOMY Left 12/09/2020   Procedure: RE-EXCISION OF LEFT BREAST LUMPECTOMY;  Surgeon: Erroll Luna, MD;  Location: [place];  Service: General;  Laterality: Left;    There were no vitals filed for this visit.   Subjective Assessment - 12/23/20 1026    Subjective 26 min late becuase of 2 hour school delay.Axillary region is  feeling better.  Can reach behind her now with it now with just a little tightness. Still has a lot of sensitivity at her lateral arm. AROM is way better than it was.  Having much less pain. Got a front zip up bra but it really hurts her shoulders so she got a different bra that zips in the back and is supportive and it feels better. Last week had some fluid drained from incision area. At end of session pt noted that she felt her arm might be swollen at the top.  will reassess next visit    Pertinent History Patient was diagnosed on 05/12/2020 with left breast cancer. She underwent neoadjuvant chemotherapy 06/19/2020-09/23/2020 and had a left lumpectomy and sentinel node biopsy (6 negative nodes) on 11/19/2020.  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    Patient Stated Goals Decrease pain and improve shoulder ROM    Currently in Pain? Yes    Pain Score 4     Pain Location Arm    Pain Orientation Left    Pain Descriptors / Indicators Tender;Tightness;Numbness    Pain Type Acute pain    Pain Onset 1 to 4 weeks ago    Pain Frequency Intermittent    Pain Relieving Factors feeling better now that she has loosenedup her shoulder some    Multiple Pain Sites No  [facility] Adult PT Treatment/Exercise - 12/23/20 0001      Shoulder Exercises: Supine   Other Supine Exercises AA flexion with clasped hands, stargazer stretch x 5      Manual Therapy   Edema Management assessed left breast;fibrosis noted around both incisions    Myofascial Release left axillary region and upper arm    Passive ROM left shoulder flex, abd, IR and ER with VC's to relax                  PT Education - 12/23/20 1111    Education Details Checked bra and suggested she might feel better with a regular compression bra, and will likely want a sleeve as well.  Will have her measured next Friday by Melissa .  Will send info today.  Pt to try AA flexion and ER in supine to  improve ROM    Person(s) Educated Patient    Methods Explanation;Demonstration    Comprehension Verbalized understanding               PT Long Term Goals - 12/08/20 1603      PT LONG TERM GOAL #1   Title Patient will demonstrate she has regained full shoulder ROM and function post operatively compared to baselines.    Time 6    Period Weeks    Status On-going    Target Date 01/19/21      PT LONG TERM GOAL #2   Title Patient will increase left shoulder active flexion to >/= 135 degrees for increased ease reaching.    Baseline 109 post op; 138 pre-op    Time 6    Period Weeks    Status New    Target Date 01/19/21      PT LONG TERM GOAL #3   Title Patient will increase left shoulder active abduction to >/= 165 degrees for increased ease reaching.    Baseline 138 post op; 163 pre-op    Time 6    Period Weeks    Status New    Target Date 01/19/21      PT LONG TERM GOAL #4   Title Patient will improve her DASH score to be </= 20 for improved overall upper extremity function.    Baseline 70.45 post op; 18.18 pre-op    Time 6    Period Weeks    Status New    Target Date 01/19/21      PT LONG TERM GOAL #5   Title Patient will report >/= 50% decrease in pain for increased tolerance of daily tasks.    Baseline Patient unable to tolerate any work tasks or home tasks currently.    Time 6    Period Weeks    Status New    Target Date 01/19/21                 Plan - 12/23/20 1113    Clinical Impression Statement pt was very late today secondary to school delay and no one at front to answer calls so she came in anyway and was able to be seen for a short appt..  We assessed her bra which feels better than the front  zip up bra she had but she still feels the binder is more comfortable.  Discussed having her fit for a proper compression bra that straps can be adjusted more.  At the end of the session she also noted that she feels her left Upper arm is swollen so will  measure  next visit.  Therapy consisted of assessing bra, reviewing AA exercises, MFR to area of cording in axilla/upper arm, and PROM of the left shoulder.  She did require VC's to relax but did very well overall.  We also discussed desensitization techniques with different fabrics.    Stability/Clinical Decision Making Stable/Uncomplicated    Clinical Decision Making Low    Rehab Potential Excellent    PT Frequency 2x / week    PT Duration 4 weeks    PT Treatment/Interventions ADLs/Self Care Home Management;Therapeutic exercise;Patient/family education;Passive range of motion;Manual lymph drainage;Manual techniques;Scar mobilization    PT Next Visit Plan measure left UE circumference, Continue MFR, PROM to left shoulder, STW to lateral arm and pecs. ?chip pack to fibrotic areas, MLD prn   PT Home Exercise Plan Post op shoulder ROM HEP, desensitization techniques   Consulted and Agree with Plan of Care Patient           Patient will benefit from skilled therapeutic intervention in order to improve the following deficits and impairments:  Postural dysfunction,Decreased range of motion,Impaired UE functional use,Pain,Decreased knowledge of precautions,Decreased scar mobility,Increased fascial restricitons  Visit Diagnosis: Malignant neoplasm of upper-outer quadrant of left breast in female, estrogen receptor positive ([facility])  Abnormal posture  Aftercare following surgery for neoplasm  Stiffness of left shoulder, not elsewhere classified  Localized edema     Problem List Patient Active Problem List   Diagnosis Date Noted  . Drug-induced neutropenia ([place]) 09/30/2020  . Genetic testing 06/26/2020  . Family history of ovarian cancer   . Family history of prostate cancer   . Malignant neoplasm of upper-outer quadrant of left breast in female, estrogen receptor positive ([place]) 06/03/2020    Claris Pong 12/23/2020, 11:21 AM  [place], [place], [address] Phone: [phone]   Fax:  [phone]  Name: KYNADI DRAGOS MRN: [id number] Date of Birth: 06-01-1986 Cheral Almas, PT 12/23/20 11:29 AM

## 2020-12-25 ENCOUNTER — Other Ambulatory Visit: Payer: Self-pay | Admitting: Oncology

## 2020-12-25 ENCOUNTER — Other Ambulatory Visit: Payer: Self-pay

## 2020-12-25 ENCOUNTER — Ambulatory Visit: Payer: 59

## 2020-12-25 DIAGNOSIS — Z483 Aftercare following surgery for neoplasm: Secondary | ICD-10-CM

## 2020-12-25 DIAGNOSIS — Z170 Estrogen receptor positive status [ER+]: Secondary | ICD-10-CM

## 2020-12-25 DIAGNOSIS — C50412 Malignant neoplasm of upper-outer quadrant of left female breast: Secondary | ICD-10-CM

## 2020-12-25 DIAGNOSIS — M25612 Stiffness of left shoulder, not elsewhere classified: Secondary | ICD-10-CM

## 2020-12-25 DIAGNOSIS — R293 Abnormal posture: Secondary | ICD-10-CM

## 2020-12-25 DIAGNOSIS — R600 Localized edema: Secondary | ICD-10-CM

## 2020-12-25 MED ORDER — LORAZEPAM 0.5 MG PO TABS: 0.5000 mg | ORAL_TABLET | Freq: Three times a day (TID) | ORAL | 0 refills | Status: DC

## 2020-12-25 NOTE — Therapy (Signed)
[place], [place], [address] Phone: [phone]   Fax:  [phone]  Physical Therapy Treatment  Patient Details  Name: Jeanne Evans MRN: [id number] Date of Birth: September 05, 1986 Referring Provider (PT): Dr. Erroll Luna   Encounter Date: 12/25/2020   PT End of Session - 12/25/20 1114    Visit Number 4    Number of Visits 10    Date for PT Re-Evaluation 01/19/21    PT Start Time 0906    PT Stop Time 1001    PT Time Calculation (min) 55 min    Activity Tolerance Patient tolerated treatment well    Behavior During Therapy [facility] for tasks assessed/performed           Past Medical History:  Diagnosis Date  . Breast cancer ([place])   . Family history of ovarian cancer   . Family history of prostate cancer   . GERD (gastroesophageal reflux disease)   . History of asthma    has albuterol, uses PRN  . History of heart murmur in childhood   . History of multiple allergies     Past Surgical History:  Procedure Laterality Date  . BREAST LUMPECTOMY WITH RADIOACTIVE SEED AND SENTINEL LYMPH NODE BIOPSY Left 11/19/2020   Procedure: LEFT BREAST LUMPECTOMY X 2 WITH RADIOACTIVE SEED AND SENTINEL LYMPH NODE MAPPING;  Surgeon: Erroll Luna, MD;  Location: [place];  Service: General;  Laterality: Left;  PECTORAL BLOCK  . PORTACATH PLACEMENT Right 06/18/2020   Procedure: INSERTION PORT-A-CATH WITH ULTRASOUND GUIDANCE;  Surgeon: Erroll Luna, MD;  Location: [place];  Service: General;  Laterality: Right;  . RE-EXCISION OF BREAST LUMPECTOMY Left 12/09/2020   Procedure: RE-EXCISION OF LEFT BREAST LUMPECTOMY;  Surgeon: Erroll Luna, MD;  Location: [place];  Service: General;  Laterality: Left;    There were no vitals filed for this visit.   Subjective Assessment - 12/25/20 0903    Subjective I am a little tired because I didn't sleep well.  Havent  stretched yet.  Yesterday I reached into the closet and it hurt so bad in the armpit area.  No present pain, but feels tight.  Did fine after last visit. I tried some of the desensitization techniques with soft materials and then a towel but there is no change yet.    Pertinent History Patient was diagnosed on 05/12/2020 with left breast cancer. She underwent neoadjuvant chemotherapy 06/19/2020-09/23/2020 and had a left lumpectomy and sentinel node biopsy (6 negative nodes) on 11/19/2020.  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    Patient Stated Goals Decrease pain and improve shoulder ROM    Currently in Pain? No/denies                 LYMPHEDEMA/ONCOLOGY QUESTIONNAIRE - 12/25/20 0001      Right Upper Extremity Lymphedema   At Axilla  35.2 cm    15 cm Proximal to Olecranon Process 36.3 cm    10 cm Proximal to Olecranon Process 36.1 cm      Left Upper Extremity Lymphedema   At Axilla  35.2 cm    15 cm Proximal to Olecranon Process 36.2 cm    10 cm Proximal to Olecranon Process 35.5 cm                      [facility] Adult PT Treatment/Exercise - 12/25/20 0001      Shoulder Exercises: Pulleys  Flexion 2 minutes    Scaption 1 minute    ABduction --      Manual Therapy   Edema Management measured bilateral  upper arm; no significant difference in circumference   Soft tissue mobilization left UT, pecs, lateral arm with coconut oil , axillary incision   Myofascial Release left axillary region and upper arm area of cording   Passive ROM left shoulder flex, abd, IR and ER with VC's to relax                       PT Long Term Goals - 12/08/20 1603      PT LONG TERM GOAL #1   Title Patient will demonstrate she has regained full shoulder ROM and function post operatively compared to baselines.    Time 6    Period Weeks    Status On-going    Target Date 01/19/21      PT LONG TERM GOAL #2   Title Patient will increase left shoulder active flexion  to >/= 135 degrees for increased ease reaching.    Baseline 109 post op; 138 pre-op    Time 6    Period Weeks    Status New    Target Date 01/19/21      PT LONG TERM GOAL #3   Title Patient will increase left shoulder active abduction to >/= 165 degrees for increased ease reaching.    Baseline 138 post op; 163 pre-op    Time 6    Period Weeks    Status New    Target Date 01/19/21      PT LONG TERM GOAL #4   Title Patient will improve her DASH score to be </= 20 for improved overall upper extremity function.    Baseline 70.45 post op; 18.18 pre-op    Time 6    Period Weeks    Status New    Target Date 01/19/21      PT LONG TERM GOAL #5   Title Patient will report >/= 50% decrease in pain for increased tolerance of daily tasks.    Baseline Patient unable to tolerate any work tasks or home tasks currently.    Time 6    Period Weeks    Status New    Target Date 01/19/21                 Plan - 12/25/20 1114    Clinical Impression Statement Pt had concerns of Left upper arm feeling swollen so measured both sides today with no significant difference noted.  She continues with sensitivity and cording in the axillary region.  Treatment consisted of soft tissue work to the left UT, pecs and lateral arm, MFR techniques to the left axillary region over area of cording, PROM of the left shoulder with multiple VC's for pt to relax, and Pulleys.  Pt was educated about her appt for measuring on Feb 18.    Stability/Clinical Decision Making Stable/Uncomplicated    Rehab Potential Excellent    PT Frequency 2x / week    PT Duration 4 weeks    PT Treatment/Interventions ADLs/Self Care Home Management;Therapeutic exercise;Patient/family education;Passive range of motion;Manual lymph drainage;Manual techniques;Scar mobilization    PT Next Visit Plan Continue MFR, PROM to left shoulder, STW to lateral arm and pecs. ?chip pack to fibrotic areas, scar mobilization MLD prn    PT Home Exercise  Plan Post op shoulder ROM HEP, desensitization techniques    Recommended Other Services  compression bra, sleeve, gauntlet (info sent) to be measured Feb 18    Consulted and Agree with Plan of Care Patient           Patient will benefit from skilled therapeutic intervention in order to improve the following deficits and impairments:  Postural dysfunction,Decreased range of motion,Impaired UE functional use,Pain,Decreased knowledge of precautions,Decreased scar mobility,Increased fascial restricitons  Visit Diagnosis: Malignant neoplasm of upper-outer quadrant of left breast in female, estrogen receptor positive ([place])  Abnormal posture  Aftercare following surgery for neoplasm  Stiffness of left shoulder, not elsewhere classified  Localized edema     Problem List Patient Active Problem List   Diagnosis Date Noted  . Drug-induced neutropenia ([place]) 09/30/2020  . Genetic testing 06/26/2020  . Family history of ovarian cancer   . Family history of prostate cancer   . Malignant neoplasm of upper-outer quadrant of left breast in female, estrogen receptor positive ([place]) 06/03/2020    Claris Pong 12/25/2020, 11:20 AM  [place], [place], [address] Phone: [phone]   Fax:  [phone]  Name: Jeanne Evans MRN: [id number] Date of Birth: [birth year]  Cheral Almas, PT 12/25/20 11:21 AM

## 2020-12-30 ENCOUNTER — Ambulatory Visit: Payer: 59

## 2020-12-30 ENCOUNTER — Other Ambulatory Visit: Payer: Self-pay

## 2020-12-30 DIAGNOSIS — Z483 Aftercare following surgery for neoplasm: Secondary | ICD-10-CM

## 2020-12-30 DIAGNOSIS — C50412 Malignant neoplasm of upper-outer quadrant of left female breast: Secondary | ICD-10-CM

## 2020-12-30 DIAGNOSIS — Z170 Estrogen receptor positive status [ER+]: Secondary | ICD-10-CM

## 2020-12-30 DIAGNOSIS — R293 Abnormal posture: Secondary | ICD-10-CM

## 2020-12-30 DIAGNOSIS — R600 Localized edema: Secondary | ICD-10-CM

## 2020-12-30 DIAGNOSIS — M25612 Stiffness of left shoulder, not elsewhere classified: Secondary | ICD-10-CM

## 2020-12-30 NOTE — Therapy (Signed)
[place], [place], [address] Phone: [phone]   Fax:  [phone]  Physical Therapy Treatment  Patient Details  Name: Jeanne Evans MRN: [id number] Date of Birth: 04-28-1986 Referring Provider (PT): Dr. Erroll Luna   Encounter Date: 12/30/2020   PT End of Session - 12/30/20 1056    Visit Number 5    Number of Visits 10    Date for PT Re-Evaluation 01/19/21    PT Start Time 6761    PT Stop Time 1059    PT Time Calculation (min) 45 min    Activity Tolerance Patient tolerated treatment well    Behavior During Therapy [facility] for tasks assessed/performed           Past Medical History:  Diagnosis Date  . Breast cancer ([place])   . Family history of ovarian cancer   . Family history of prostate cancer   . GERD (gastroesophageal reflux disease)   . History of asthma    has albuterol, uses PRN  . History of heart murmur in childhood   . History of multiple allergies     Past Surgical History:  Procedure Laterality Date  . BREAST LUMPECTOMY WITH RADIOACTIVE SEED AND SENTINEL LYMPH NODE BIOPSY Left 11/19/2020   Procedure: LEFT BREAST LUMPECTOMY X 2 WITH RADIOACTIVE SEED AND SENTINEL LYMPH NODE MAPPING;  Surgeon: Erroll Luna, MD;  Location: [place];  Service: General;  Laterality: Left;  PECTORAL BLOCK  . PORTACATH PLACEMENT Right 06/18/2020   Procedure: INSERTION PORT-A-CATH WITH ULTRASOUND GUIDANCE;  Surgeon: Erroll Luna, MD;  Location: [place];  Service: General;  Laterality: Right;  . RE-EXCISION OF BREAST LUMPECTOMY Left 12/09/2020   Procedure: RE-EXCISION OF LEFT BREAST LUMPECTOMY;  Surgeon: Erroll Luna, MD;  Location: [place];  Service: General;  Laterality: Left;    There were no vitals filed for this visit.   Subjective Assessment - 12/30/20 1013    Subjective Have felt pulling in the elbow over the last couple of days.  Feels tender to touch.Pt reports trying to keep up with her exercises. Still limited with reaching up. 13 min. late today    Pertinent History Patient was diagnosed on 05/12/2020 with left breast cancer. She underwent neoadjuvant chemotherapy 06/19/2020-09/23/2020 and had a left lumpectomy and sentinel node biopsy (6 negative nodes) on 11/19/2020.  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    Patient Stated Goals Decrease pain and improve shoulder ROM    Currently in Pain? Yes    Pain Score 6     Pain Location Elbow    Pain Orientation Left    Pain Descriptors / Indicators Tender;Tightness    Pain Type Acute pain    Pain Onset 1 to 4 weeks ago    Pain Frequency Intermittent    Multiple Pain Sites No                             [facility] Adult PT Treatment/Exercise - 12/30/20 0001      Shoulder Exercises: Pulleys   Flexion 2 minutes    Scaption 1 minute      Manual Therapy   Edema Management Small TG soft cut for left UE    Soft tissue mobilization left UT, pecs, lateral arm with coconut oil    Myofascial Release left axillary region and upper arm areas of cording   Passive ROM left shoulder  flex, abd, IR and ER and D2 flex with VC's to relax                       PT Long Term Goals - 12/08/20 1603      PT LONG TERM GOAL #1   Title Patient will demonstrate she has regained full shoulder ROM and function post operatively compared to baselines.    Time 6    Period Weeks    Status On-going    Target Date 01/19/21      PT LONG TERM GOAL #2   Title Patient will increase left shoulder active flexion to >/= 135 degrees for increased ease reaching.    Baseline 109 post op; 138 pre-op    Time 6    Period Weeks    Status New    Target Date 01/19/21      PT LONG TERM GOAL #3   Title Patient will increase left shoulder active abduction to >/= 165 degrees for increased ease reaching.    Baseline 138 post op; 163 pre-op    Time 6    Period Weeks     Status New    Target Date 01/19/21      PT LONG TERM GOAL #4   Title Patient will improve her DASH score to be </= 20 for improved overall upper extremity function.    Baseline 70.45 post op; 18.18 pre-op    Time 6    Period Weeks    Status New    Target Date 01/19/21      PT LONG TERM GOAL #5   Title Patient will report >/= 50% decrease in pain for increased tolerance of daily tasks.    Baseline Patient unable to tolerate any work tasks or home tasks currently.    Time 6    Period Weeks    Status New    Target Date 01/19/21                 Plan - 12/30/20 1211    Clinical Impression Statement Pt comes in with greater sensitivity around medial elbow and upper arm today.  She continues with multiple cords and with limitation in shoulder ROM.  Therapy consisted of MFR techniques to areas of cording, STM to scar, PROM left shoulder, and pulleys.  A TG soft was cut o provide some comfort to left arm with cording. Pt continues to require VC's to relax for PROM.  PROM is improving but still limited.    Stability/Clinical Decision Making Stable/Uncomplicated    Rehab Potential Excellent    PT Frequency 1x / week    PT Duration 4 weeks    PT Treatment/Interventions ADLs/Self Care Home Management;Therapeutic exercise;Patient/family education;Passive range of motion;Manual lymph drainage;Manual techniques;Scar mobilization    PT Next Visit Plan Continue MFR, PROM to left shoulder, STW to lateral arm and pecs. ?chip pack to fibrotic areas, scar mobilization MLD prn    PT Home Exercise Plan Post op shoulder ROM HEP, desensitization techniques    Consulted and Agree with Plan of Care Patient           Patient will benefit from skilled therapeutic intervention in order to improve the following deficits and impairments:  Postural dysfunction,Decreased range of motion,Impaired UE functional use,Pain,Decreased knowledge of precautions,Decreased scar mobility,Increased fascial  restricitons,Increased edema,Impaired flexibility  Visit Diagnosis: Malignant neoplasm of upper-outer quadrant of left breast in female, estrogen receptor positive ([place])  Abnormal posture  Aftercare following surgery for neoplasm  Stiffness of left shoulder, not elsewhere classified  Localized edema     Problem List Patient Active Problem List   Diagnosis Date Noted  . Drug-induced neutropenia ([place]) 09/30/2020  . Genetic testing 06/26/2020  . Family history of ovarian cancer   . Family history of prostate cancer   . Malignant neoplasm of upper-outer quadrant of left breast in female, estrogen receptor positive ([place]) 06/03/2020    Claris Pong 12/30/2020, 12:16 PM  [place], [place], [address] Phone: [phone]   Fax:  [phone]  Name: Jeanne Evans MRN: [id number] Date of Birth: 1986/11/05 Cheral Almas, PT 12/30/20 12:17 PM

## 2021-01-01 ENCOUNTER — Ambulatory Visit: Payer: 59

## 2021-01-01 ENCOUNTER — Other Ambulatory Visit: Payer: Self-pay

## 2021-01-01 DIAGNOSIS — R600 Localized edema: Secondary | ICD-10-CM

## 2021-01-01 DIAGNOSIS — Z483 Aftercare following surgery for neoplasm: Secondary | ICD-10-CM

## 2021-01-01 DIAGNOSIS — M25612 Stiffness of left shoulder, not elsewhere classified: Secondary | ICD-10-CM

## 2021-01-01 DIAGNOSIS — R293 Abnormal posture: Secondary | ICD-10-CM

## 2021-01-01 DIAGNOSIS — C50412 Malignant neoplasm of upper-outer quadrant of left female breast: Secondary | ICD-10-CM

## 2021-01-01 DIAGNOSIS — Z170 Estrogen receptor positive status [ER+]: Secondary | ICD-10-CM

## 2021-01-01 NOTE — Therapy (Signed)
[place], [place], [address] Phone: [phone]   Fax:  [phone]  Physical Therapy Treatment  Patient Details  Name: Jeanne Evans MRN: [id number] Date of Birth: [birth year] Referring Provider (PT): Dr. Erroll Luna   Encounter Date: 01/01/2021   PT End of Session - 01/01/21 1221    Visit Number 6    Number of Visits 10    Date for PT Re-Evaluation 01/19/21    PT Start Time 1004    PT Stop Time 1100    PT Time Calculation (min) 56 min    Activity Tolerance Patient tolerated treatment well    Behavior During Therapy [facility] for tasks assessed/performed           Past Medical History:  Diagnosis Date  . Breast cancer ([place])   . Family history of ovarian cancer   . Family history of prostate cancer   . GERD (gastroesophageal reflux disease)   . History of asthma    has albuterol, uses PRN  . History of heart murmur in childhood   . History of multiple allergies     Past Surgical History:  Procedure Laterality Date  . BREAST LUMPECTOMY WITH RADIOACTIVE SEED AND SENTINEL LYMPH NODE BIOPSY Left 11/19/2020   Procedure: LEFT BREAST LUMPECTOMY X 2 WITH RADIOACTIVE SEED AND SENTINEL LYMPH NODE MAPPING;  Surgeon: Erroll Luna, MD;  Location: [place];  Service: General;  Laterality: Left;  PECTORAL BLOCK  . PORTACATH PLACEMENT Right 06/18/2020   Procedure: INSERTION PORT-A-CATH WITH ULTRASOUND GUIDANCE;  Surgeon: Erroll Luna, MD;  Location: [place];  Service: General;  Laterality: Right;  . RE-EXCISION OF BREAST LUMPECTOMY Left 12/09/2020   Procedure: RE-EXCISION OF LEFT BREAST LUMPECTOMY;  Surgeon: Erroll Luna, MD;  Location: [place];  Service: General;  Laterality: Left;    There were no vitals filed for this visit.   Subjective Assessment - 01/01/21 1004    Subjective Elbow is still tender to touch.  Shoulder is till tight and  under arm was hurting.  Still getting stretches in several times per day.  Elbow felt pretty good for several days after I was here, but hurts again now.  The TG soft felt good but then made my arm itch. I took it off last night.    Pertinent History Patient was diagnosed on 05/12/2020 with left breast cancer. She underwent neoadjuvant chemotherapy 06/19/2020-09/23/2020 and had a left lumpectomy and sentinel node biopsy (6 negative nodes) on 11/19/2020.  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    Currently in Pain? Yes    Pain Score 6     Pain Location Elbow    Pain Orientation Left    Pain Descriptors / Indicators Tender;Aching    Pain Onset 1 to 4 weeks ago    Multiple Pain Sites Yes    Pain Score 6    Pain Descriptors / Indicators Tender    Pain Onset 1 to 4 weeks ago    Aggravating Factors  cording,                             [facility] Adult PT Treatment/Exercise - 01/01/21 0001      Shoulder Exercises: Supine   Other Supine Exercises AA shoulder flex and acaption x 5 ea      Manual Therapy   Edema Management chip pack made for left breast to  put in bra    Myofascial Release left axillary region and upper arm and forearm cording   Passive ROM left shoulder flex, abd, IR and ER and D2 flex with VC's to relax                  PT Education - 01/01/21 1220    Education Details Pt given info for [facility] [place] to get bra and A special place to check on Sleeve since Melissa is not going to be coming for several weeks..  Also educated in supine wand for flex and scaption to progress ROM    Person(s) Educated Patient    Methods Explanation    Comprehension Verbalized understanding;Returned demonstration               PT Long Term Goals - 12/08/20 1603      PT LONG TERM GOAL #1   Title Patient will demonstrate she has regained full shoulder ROM and function post operatively compared to baselines.    Time 6    Period Weeks    Status On-going     Target Date 01/19/21      PT LONG TERM GOAL #2   Title Patient will increase left shoulder active flexion to >/= 135 degrees for increased ease reaching.    Baseline 109 post op; 138 pre-op    Time 6    Period Weeks    Status New    Target Date 01/19/21      PT LONG TERM GOAL #3   Title Patient will increase left shoulder active abduction to >/= 165 degrees for increased ease reaching.    Baseline 138 post op; 163 pre-op    Time 6    Period Weeks    Status New    Target Date 01/19/21      PT LONG TERM GOAL #4   Title Patient will improve her DASH score to be </= 20 for improved overall upper extremity function.    Baseline 70.45 post op; 18.18 pre-op    Time 6    Period Weeks    Status New    Target Date 01/19/21      PT LONG TERM GOAL #5   Title Patient will report >/= 50% decrease in pain for increased tolerance of daily tasks.    Baseline Patient unable to tolerate any work tasks or home tasks currently.    Time 6    Period Weeks    Status New    Target Date 01/19/21                 Plan - 01/01/21 1221    Clinical Impression Statement Pt had good relief of elbow pain for several days after last visit but this has returned again.  Cording still present and running elbow into forearm.  STM was performed to incision and instructed to pt to decrease fibrosis and pt was educated in supine wand exercises which really helped her ROM.  She was given info and script on [facility] [place] to purchase bra, and a [facility] to consider getting sleeve.  She was encouraged to continue ROM exs over the weekend    Rehab Potential Excellent    PT Frequency 2x / week    PT Duration 4 weeks    PT Treatment/Interventions ADLs/Self Care Home Management;Therapeutic exercise;Patient/family education;Passive range of motion;Manual lymph drainage;Manual techniques;Scar mobilization    PT Next Visit Plan Continue MFR, PROM to left shoulder, STW to lateral arm  and pecs. ?chip pack  help  to left breast?, scar mobilization MLD prn.  Did she try to get sleeve/bra    PT Home Exercise Plan Post op shoulder ROM HEP, desensitization techniques    Recommended Other Services Sleeve/bra;gave script and info for [facility] and [facility] and she will look into it    Consulted and Agree with Plan of Care Patient           Patient will benefit from skilled therapeutic intervention in order to improve the following deficits and impairments:  Postural dysfunction,Decreased range of motion,Impaired UE functional use,Pain,Decreased knowledge of precautions,Decreased scar mobility,Increased fascial restricitons,Increased edema,Impaired flexibility  Visit Diagnosis: Malignant neoplasm of upper-outer quadrant of left breast in female, estrogen receptor positive ([place])  Abnormal posture  Aftercare following surgery for neoplasm  Stiffness of left shoulder, not elsewhere classified  Localized edema     Problem List Patient Active Problem List   Diagnosis Date Noted  . Drug-induced neutropenia ([place]) 09/30/2020  . Genetic testing 06/26/2020  . Family history of ovarian cancer   . Family history of prostate cancer   . Malignant neoplasm of upper-outer quadrant of left breast in female, estrogen receptor positive ([place]) 06/03/2020    Claris Pong 01/01/2021, 12:27 PM  [place], [place], [address] Phone: [phone]   Fax:  [phone]  Name: ANNETTE BERTELSON MRN: [id number] Date of Birth: 1986/11/05 Cheral Almas, PT 01/01/21 12:28 PM

## 2021-01-05 ENCOUNTER — Ambulatory Visit: Payer: 59

## 2021-01-05 ENCOUNTER — Other Ambulatory Visit: Payer: Self-pay

## 2021-01-05 ENCOUNTER — Telehealth: Payer: Self-pay | Admitting: *Deleted

## 2021-01-05 DIAGNOSIS — C50412 Malignant neoplasm of upper-outer quadrant of left female breast: Secondary | ICD-10-CM | POA: Diagnosis not present

## 2021-01-05 DIAGNOSIS — Z483 Aftercare following surgery for neoplasm: Secondary | ICD-10-CM

## 2021-01-05 DIAGNOSIS — Z170 Estrogen receptor positive status [ER+]: Secondary | ICD-10-CM

## 2021-01-05 DIAGNOSIS — R293 Abnormal posture: Secondary | ICD-10-CM

## 2021-01-05 DIAGNOSIS — R600 Localized edema: Secondary | ICD-10-CM

## 2021-01-05 DIAGNOSIS — M25612 Stiffness of left shoulder, not elsewhere classified: Secondary | ICD-10-CM

## 2021-01-05 NOTE — Therapy (Signed)
[place], [place], [address] Phone: [phone]   Fax:  [phone]  Physical Therapy Treatment  Patient Details  Name: Jeanne Evans MRN: [id number] Date of Birth: 06-29-1986 Referring Provider (PT): Dr. Erroll Luna   Encounter Date: 01/05/2021   PT End of Session - 01/05/21 1219    Visit Number 7    Number of Visits 10    Date for PT Re-Evaluation 01/19/21    PT Start Time 1103    PT Stop Time 1156    PT Time Calculation (min) 53 min    Behavior During Therapy [facility] for tasks assessed/performed           Past Medical History:  Diagnosis Date  . Breast cancer ([place])   . Family history of ovarian cancer   . Family history of prostate cancer   . GERD (gastroesophageal reflux disease)   . History of asthma    has albuterol, uses PRN  . History of heart murmur in childhood   . History of multiple allergies     Past Surgical History:  Procedure Laterality Date  . BREAST LUMPECTOMY WITH RADIOACTIVE SEED AND SENTINEL LYMPH NODE BIOPSY Left 11/19/2020   Procedure: LEFT BREAST LUMPECTOMY X 2 WITH RADIOACTIVE SEED AND SENTINEL LYMPH NODE MAPPING;  Surgeon: Erroll Luna, MD;  Location: [place];  Service: General;  Laterality: Left;  PECTORAL BLOCK  . PORTACATH PLACEMENT Right 06/18/2020   Procedure: INSERTION PORT-A-CATH WITH ULTRASOUND GUIDANCE;  Surgeon: Erroll Luna, MD;  Location: [place];  Service: General;  Laterality: Right;  . RE-EXCISION OF BREAST LUMPECTOMY Left 12/09/2020   Procedure: RE-EXCISION OF LEFT BREAST LUMPECTOMY;  Surgeon: Erroll Luna, MD;  Location: [place];  Service: General;  Laterality: Left;    There were no vitals filed for this visit.   Subjective Assessment - 01/05/21 1105    Subjective Have an appt for the bra on Feb 24th.  The sleeve was ordered on Friday and should take a week and a half. Wearing the  TG soft and it still itches a little.  My elbow still hurts some at the elbow and slightly into forearm.  It feels better after I stretch.  the chip pack got the swelling down a whole lot and the area is a lot smaller.    Pertinent History Patient was diagnosed on 05/12/2020 with left breast cancer. She underwent neoadjuvant chemotherapy 06/19/2020-09/23/2020 and had a left lumpectomy and sentinel node biopsy (6 negative nodes) on 11/19/2020.  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    Currently in Pain? Yes    Pain Score 6     Pain Location Chest    Pain Orientation Left    Pain Descriptors / Indicators Tender;Tightness    Pain Onset 1 to 4 weeks ago    Pain Frequency Constant    Pain Score 6    Pain Location Arm    Pain Orientation Left    Pain Descriptors / Indicators Aching;Tender;Tightness    Pain Onset 1 to 4 weeks ago    Pain Frequency Constant    Aggravating Factors  cording, reaching overhead, grabbing towel off rack, cooking                             [facility] Adult PT Treatment/Exercise - 01/05/21 0001      Shoulder Exercises: Supine   Other  Supine Exercises AA shoulder flex and scaption with wand x 5 ea      Manual Therapy   Edema Management firmness continues at incision area but in a smaller area    Soft tissue mobilization left UT, pecs, lateral arm and forearm with coconut oil, bilateral incisions   Myofascial Release left axillary region and upper arm, elbow region to areas of cording   Passive ROM left shoulder flex, abd, IR and ER and D2 flex with VC's to relax                       PT Long Term Goals - 12/08/20 1603      PT LONG TERM GOAL #1   Title Patient will demonstrate she has regained full shoulder ROM and function post operatively compared to baselines.    Time 6    Period Weeks    Status On-going    Target Date 01/19/21      PT LONG TERM GOAL #2   Title Patient will increase left shoulder active flexion to >/=  135 degrees for increased ease reaching.    Baseline 109 post op; 138 pre-op    Time 6    Period Weeks    Status New    Target Date 01/19/21      PT LONG TERM GOAL #3   Title Patient will increase left shoulder active abduction to >/= 165 degrees for increased ease reaching.    Baseline 138 post op; 163 pre-op    Time 6    Period Weeks    Status New    Target Date 01/19/21      PT LONG TERM GOAL #4   Title Patient will improve her DASH score to be </= 20 for improved overall upper extremity function.    Baseline 70.45 post op; 18.18 pre-op    Time 6    Period Weeks    Status New    Target Date 01/19/21      PT LONG TERM GOAL #5   Title Patient will report >/= 50% decrease in pain for increased tolerance of daily tasks.    Baseline Patient unable to tolerate any work tasks or home tasks currently.    Time 6    Period Weeks    Status New    Target Date 01/19/21                 Plan - 01/05/21 1220    Clinical Impression Statement Pt continues with multiple cords in the left axillary with some radiating below the elbow.  She continues with Medial arm tenderness.  STW was performed to left forearm and upper arm and pectorals.  MFR was performed to axillary region and elbow/forearm cording.  PROM was continued to left shoulder and ROM is improving.  Pt felt much better after rx and much looser.  A moderate amount of glue was removed from breast incision, and the actual incision looks good.  Fibrosis still noted around breast incision with mild tenderness    Stability/Clinical Decision Making Stable/Uncomplicated    Rehab Potential Excellent    PT Frequency 2x / week    PT Duration 4 weeks    PT Treatment/Interventions ADLs/Self Care Home Management;Therapeutic exercise;Patient/family education;Passive range of motion;Manual lymph drainage;Manual techniques;Scar mobilization    PT Next Visit Plan Continue MFR, PROM to left shoulder, STW to lateral arm and pecs. ?chip pack   help to left breast?, scar mobilization MLD prn.  PT Home Exercise Plan Post op shoulder ROM HEP, desensitization techniques    Recommended Other Services awaiting sleeve and bra (bra appt 2/24    Consulted and Agree with Plan of Care Patient           Patient will benefit from skilled therapeutic intervention in order to improve the following deficits and impairments:  Postural dysfunction,Decreased range of motion,Impaired UE functional use,Pain,Decreased knowledge of precautions,Decreased scar mobility,Increased fascial restricitons,Increased edema,Impaired flexibility  Visit Diagnosis: Malignant neoplasm of upper-outer quadrant of left breast in female, estrogen receptor positive ([place])  Abnormal posture  Aftercare following surgery for neoplasm  Stiffness of left shoulder, not elsewhere classified  Localized edema     Problem List Patient Active Problem List   Diagnosis Date Noted  . Drug-induced neutropenia ([place]) 09/30/2020  . Genetic testing 06/26/2020  . Family history of ovarian cancer   . Family history of prostate cancer   . Malignant neoplasm of upper-outer quadrant of left breast in female, estrogen receptor positive ([place]) 06/03/2020    Claris Pong 01/05/2021, 12:25 PM  [place], [place], [address] Phone: [phone]   Fax:  [phone]  Name: Jeanne Evans MRN: [id number] Date of Birth: 1985/12/07  Cheral Almas, PT 01/05/21 12:27 PM

## 2021-01-05 NOTE — Telephone Encounter (Signed)
Connected with [name] regarding today's message asking if medical records sent to [facility] Department of [employer] by this nurse and confirming receipt of FMLA previously called for status 01/01/2021.   Provided phone number for (SW) [place] Management department [phone] opt 1).

## 2021-01-06 ENCOUNTER — Inpatient Hospital Stay: Payer: 59 | Attending: Oncology

## 2021-01-06 ENCOUNTER — Ambulatory Visit
Admission: RE | Admit: 2021-01-06 | Discharge: 2021-01-06 | Disposition: A | Discharge status: Home / Self Care | Payer: 59 | Source: Ambulatory Visit | Attending: Radiation Oncology | Admitting: Radiation Oncology

## 2021-01-06 ENCOUNTER — Other Ambulatory Visit: Payer: Self-pay

## 2021-01-06 ENCOUNTER — Encounter: Payer: Self-pay | Admitting: Radiation Oncology

## 2021-01-06 ENCOUNTER — Telehealth: Payer: Self-pay

## 2021-01-06 VITALS — BP 134/90 | HR 87 | Temp 98.9°F | Resp 20 | Ht 63.0 in | Wt 182.8 lb

## 2021-01-06 DIAGNOSIS — Z8042 Family history of malignant neoplasm of prostate: Secondary | ICD-10-CM | POA: Insufficient documentation

## 2021-01-06 DIAGNOSIS — Z8041 Family history of malignant neoplasm of ovary: Secondary | ICD-10-CM | POA: Insufficient documentation

## 2021-01-06 DIAGNOSIS — Z791 Long term (current) use of non-steroidal anti-inflammatories (NSAID): Secondary | ICD-10-CM | POA: Insufficient documentation

## 2021-01-06 DIAGNOSIS — C50412 Malignant neoplasm of upper-outer quadrant of left female breast: Secondary | ICD-10-CM | POA: Insufficient documentation

## 2021-01-06 DIAGNOSIS — Z510 Encounter for antineoplastic radiation therapy: Secondary | ICD-10-CM | POA: Insufficient documentation

## 2021-01-06 DIAGNOSIS — Z801 Family history of malignant neoplasm of trachea, bronchus and lung: Secondary | ICD-10-CM | POA: Insufficient documentation

## 2021-01-06 DIAGNOSIS — Z170 Estrogen receptor positive status [ER+]: Secondary | ICD-10-CM | POA: Diagnosis not present

## 2021-01-06 DIAGNOSIS — Z79899 Other long term (current) drug therapy: Secondary | ICD-10-CM | POA: Diagnosis not present

## 2021-01-06 DIAGNOSIS — Z171 Estrogen receptor negative status [ER-]: Secondary | ICD-10-CM | POA: Insufficient documentation

## 2021-01-06 DIAGNOSIS — K219 Gastro-esophageal reflux disease without esophagitis: Secondary | ICD-10-CM | POA: Insufficient documentation

## 2021-01-06 NOTE — Progress Notes (Signed)
New Breast Cancer Diagnosis: Left Breast Cancer   Histology per Pathology Report:  12/09/2020 Re-excision of Left Lumpectomy   Left Lumpectomy 11/20/2019    Surgeon and surgical plan, if any: Dr. Brantley Stage Left Lumpectomy  Medical oncologist, treatment if any:   Dr. Jana Hakim Completed neoadjuvant chemotherapy 06/19/2020-09/23/2020   Lymphedema issues, if any:  No  Pain issues, if any: Tenderness in her underarm.  Going to PT to help with this.  SAFETY ISSUES: Prior radiation? No Pacemaker/ICD? No Possible current pregnancy? IUD Is the patient on methotrexate? No  Current Complaints / other details:

## 2021-01-06 NOTE — Addendum Note (Signed)
Encounter addended by: Hayden Pedro, PA-C on: 01/06/2021 1:56 PM  Actions taken: Clinical Note Signed

## 2021-01-06 NOTE — Progress Notes (Cosign Needed Addendum)
Radiation Oncology         [phone] ________________________________  Name: Jeanne Evans        MRN: [id number]  Date of Service: 01/06/2021 DOB: 01/17/1986  XN:[id number], Rogelio Seen, MD  [name], MD     REFERRING PHYSICIAN: [name], MD   DIAGNOSIS: The encounter diagnosis was Malignant neoplasm of upper-outer quadrant of left breast in female, estrogen receptor positive ([place]).   HISTORY OF PRESENT ILLNESS: Jeanne Evans is a 35 y.o. female originally seen in the multidisciplinary breast clinic for a new diagnosis of left breast cancer. The patient was noted to have a painful mass in the left breast. This prompted further diagnostic imaging of the breast which revealed a mass at 2:30 and this measured 2.7 x 2.2 x 2.1 cm, and an axillary node in the left axilla was enlarged measuring 2.2 x 1.5 x .7 cm. 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 Her node was negative and felt to be concordant. Since her last visit, she proceeded with Neoadjuvant chemotherapy between 06/19/2020 and completion on 09/23/2020.  She had significant interval decrease in her known malignancy in the left breast. She then underwent left lumpectomy and sentinel node biopsy on 11/19/2020, which revealed an invasive ductal carcinoma measuring 1.4 cm that was grade 2 focally involving the superior margin, 6 lymph nodes were sampled and none contained any metastatic disease.  She did undergo reexcision of her margin which showed no residual carcinoma on 12/09/2020.  She is seen today to review the rationale for adjuvant radiotherapy.    PREVIOUS RADIATION THERAPY: No   PAST MEDICAL HISTORY:  Past Medical History:  Diagnosis Date  . Breast cancer ([place])   . Family history of ovarian cancer   . Family history  of prostate cancer   . GERD (gastroesophageal reflux disease)   . History of asthma    has albuterol, uses PRN  . History of heart murmur in childhood   . History of multiple allergies        PAST SURGICAL HISTORY: Past Surgical History:  Procedure Laterality Date  . BREAST LUMPECTOMY WITH RADIOACTIVE SEED AND SENTINEL LYMPH NODE BIOPSY Left 11/19/2020   Procedure: LEFT BREAST LUMPECTOMY X 2 WITH RADIOACTIVE SEED AND SENTINEL LYMPH NODE MAPPING;  Surgeon: Erroll Luna, MD;  Location: [facility];  Service: General;  Laterality: Left;  PECTORAL BLOCK  . PORTACATH PLACEMENT Right 06/18/2020   Procedure: INSERTION PORT-A-CATH WITH ULTRASOUND GUIDANCE;  Surgeon: Erroll Luna, MD;  Location: [facility];  Service: General;  Laterality: Right;  . RE-EXCISION OF BREAST LUMPECTOMY Left 12/09/2020   Procedure: RE-EXCISION OF LEFT BREAST LUMPECTOMY;  Surgeon: Erroll Luna, MD;  Location: [facility];  Service: General;  Laterality: Left;      FAMILY HISTORY:  Family History  Problem Relation Age of Onset  . Lung cancer Maternal Uncle        dx late 61s  . Prostate cancer Paternal Uncle        dx late 54s  . Ovarian cancer Paternal Grandmother        dx 20s  . Prostate cancer Paternal Grandfather        dx 54s  . Prostate cancer Paternal Uncle  dx early 64s     SOCIAL HISTORY:  reports that she has never smoked. She has never used smokeless tobacco. She reports current alcohol use. She reports current drug use. Drug: Marijuana. The patient is single and lives in [place]. 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 She has teenage daughters.   ALLERGIES: Sulfa antibiotics   MEDICATIONS:  Current Outpatient Medications  Medication Sig Dispense Refill  . Acetaminophen (TYLENOL) 325 MG CAPS Take by mouth.    . gabapentin (NEURONTIN) 300 MG capsule Take 300 mg by  mouth 3 (three) times daily.    Marland Kitchen ibuprofen (ADVIL) 800 MG tablet Take 1 tablet (800 mg total) by mouth every 8 (eight) hours as needed. 30 tablet 0  . levonorgestrel (MIRENA) 20 MCG/24HR IUD 1 each by Intrauterine route once.    Marland Kitchen LORazepam (ATIVAN) 0.5 MG tablet Take 1 tablet (0.5 mg total) by mouth every 8 (eight) hours. 20 tablet 0  . magic mouthwash SOLN Take 5 mLs by mouth 4 (four) times daily as needed for mouth pain. 240 mL 1  . naproxen sodium (ALEVE) 220 MG tablet Take 220 mg by mouth.    Marland Kitchen omeprazole (PRILOSEC) 40 MG capsule Take 1 capsule (40 mg total) by mouth at bedtime. 7 capsule 0  . prochlorperazine (COMPAZINE) 10 MG tablet Take 1 tablet (10 mg total) by mouth every 6 (six) hours as needed (Nausea or vomiting). 30 tablet 1  . venlafaxine XR (EFFEXOR-XR) 75 MG 24 hr capsule Take 1 capsule (75 mg total) by mouth daily with breakfast. 30 capsule 6   No current facility-administered medications for this encounter.     REVIEW OF SYSTEMS: On review of systems, the patient reports that she is doing well overall. She was very tired with chemotherapy. She denies any concerns with her breast at this time. She is interested in discussing future fertility but has struggled with hot flashes since chemotherapy. She is not currently sexually active and still has a Corporate treasurer for contraception. No other complaints are verbalized.   PHYSICAL EXAM:  Wt Readings from Last 3 Encounters:  12/09/20 183 lb 3.2 oz (83.1 kg)  12/02/20 180 lb 3.2 oz (81.7 kg)  11/19/20 179 lb 10.8 oz (81.5 kg)   Temp Readings from Last 3 Encounters:  12/09/20 (!) 97.5 F (36.4 C)  12/02/20 (!) 97.3 F (36.3 C) (Temporal)  11/19/20 (!) 97.4 F (36.3 C)   BP Readings from Last 3 Encounters:  12/09/20 (!) 151/93  12/02/20 128/78  11/19/20 (!) 161/100   Pulse Readings from Last 3 Encounters:  12/09/20 85  12/02/20 81  11/19/20 99    In general this is a well appearing African American female in no acute  distress. She's alert and oriented x4 and appropriate throughout the examination. Cardiopulmonary assessment is negative for acute distress and she exhibits normal effort. Bilateral breast exam is deferred.    ECOG = 0  0 - Asymptomatic (Fully active, able to carry on all predisease activities without restriction)  1 - Symptomatic but completely ambulatory (Restricted in physically strenuous activity but ambulatory and able to carry out work of a light or sedentary nature. For example, light housework, office work)  2 - Symptomatic, <50% in bed during the day (Ambulatory and capable of all self care but unable to carry out any work activities. Up and about more than 50% of waking hours)  3 - Symptomatic, >50% in  bed, but not bedbound (Capable of only limited self-care, confined to bed or chair 50% or more of waking hours)  4 - Bedbound (Completely disabled. Cannot carry on any self-care. Totally confined to bed or chair)  5 - Death   Eustace Pen MM, Creech RH, Tormey DC, et al. [phone]). "Toxicity and response criteria of the [facility]". [place] Oncol. 5 (6): 649-55    LABORATORY DATA:  Lab Results  Component Value Date   WBC 3.7 (L) 12/02/2020   HGB 11.5 (L) 12/02/2020   HCT 34.9 (L) 12/02/2020   MCV 91.4 12/02/2020   PLT 271 12/02/2020   Lab Results  Component Value Date   NA 140 12/02/2020   K 4.0 12/02/2020   CL 106 12/02/2020   CO2 26 12/02/2020   Lab Results  Component Value Date   ALT 22 12/02/2020   AST 19 12/02/2020   ALKPHOS 64 12/02/2020   BILITOT 0.3 12/02/2020      RADIOGRAPHY: No results found.     IMPRESSION/PLAN: 1. 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 Dr. Lisbeth Renshaw discusses the final surgical pathology findings and reviews the nature of left breast disease. Dr. Lisbeth Renshaw reviews the rationale for adjuvant external beam radiation  in patients who proceed with breast conserving surgery to reduce risks of local recurrence. We discussed the risks, benefits, short, and long term effects of radiotherapy, and the patient is interested in proceeding. Dr. Lisbeth Renshaw discusses the delivery and logistics of radiotherapy and recommends 6 1/2 weeks of radiotherapy with deep inspiration breath hold technique. Written consent is obtained and placed in the chart, a copy was provided to the patient. She will simulate today following this discussion. She will also follow up with Dr. Jana Hakim regarding immunotherapy after the completion of radiotherapy. 2. Contraceptive Counseling. The patient is using a Mirena IUD for contraception and is amenorrheic. She is not sexually active but is aware that if she becomes sexually active prior to radiotherapy her partner should wear a condom. She is also encouraged to discuss her desires for future fertility with Dr. Jana Hakim. She is in agreement.  In a visit lasting 45 minutes, greater than 50% of the time was spent face to face discussing the patient's condition, in preparation for the discussion, and coordinating the patient's care.  The above documentation reflects my direct findings during this shared patient visit. Please see the separate note by Dr. Lisbeth Renshaw on this date for the remainder of the patient's plan of care.    Carola Rhine, PAC

## 2021-01-06 NOTE — Progress Notes (Signed)
   Covid-19 Vaccination Clinic  Name:  Jeanne Evans    MRN: [id number] DOB: [birth year]  01/06/2021  [name] was observed post Covid-19 immunization for 15 minutes without incident. She was provided with Vaccine Information Sheet and instruction to access the V-Safe system.   [name] was instructed to call 911 with any severe reactions post vaccine: Marland Kitchen Difficulty breathing  . Swelling of face and throat  . A fast heartbeat  . A bad rash all over body  . Dizziness and weakness

## 2021-01-06 NOTE — Telephone Encounter (Signed)
Returned call to pt regarding pt's c/o noticing recent mood swings, inability to sleep well and low libido. Pt states when she was taking chemo Dr Jana Hakim prescribed Effexor, pt wanted to know is that something she can take now or is there something else she can take to he[p with her symptoms. Will notify MD.

## 2021-01-07 ENCOUNTER — Telehealth: Payer: Self-pay

## 2021-01-07 MED ORDER — VENLAFAXINE HCL 37.5 MG PO TABS: 37.5000 mg | ORAL_TABLET | Freq: Two times a day (BID) | ORAL | 0 refills | Status: DC

## 2021-01-07 NOTE — Telephone Encounter (Signed)
MD recommendations for patient to re-start Effexor 37.5mg  X 2 daily for 2-3 weeks, and then patient to call clinic to follow up on symptoms of mood swings, and insomnia.  RN notified patient, patient verbalized understanding and agreement and will call clinic in 2-3 weeks for telephone follow up.

## 2021-01-08 ENCOUNTER — Other Ambulatory Visit: Payer: Self-pay

## 2021-01-08 ENCOUNTER — Ambulatory Visit: Payer: 59

## 2021-01-08 DIAGNOSIS — C50412 Malignant neoplasm of upper-outer quadrant of left female breast: Secondary | ICD-10-CM

## 2021-01-08 DIAGNOSIS — M25612 Stiffness of left shoulder, not elsewhere classified: Secondary | ICD-10-CM

## 2021-01-08 DIAGNOSIS — Z483 Aftercare following surgery for neoplasm: Secondary | ICD-10-CM

## 2021-01-08 DIAGNOSIS — R293 Abnormal posture: Secondary | ICD-10-CM

## 2021-01-08 DIAGNOSIS — Z170 Estrogen receptor positive status [ER+]: Secondary | ICD-10-CM

## 2021-01-08 DIAGNOSIS — R600 Localized edema: Secondary | ICD-10-CM

## 2021-01-08 NOTE — Therapy (Signed)
[place], [place], [address] Phone: [phone]   Fax:  [phone]  Physical Therapy Treatment  Patient Details  Name: Jeanne Evans MRN: [id number] Date of Birth: 10/06/1986 Referring Provider (PT): Dr. Erroll Luna   Encounter Date: 01/08/2021   PT End of Session - 01/08/21 1055    Visit Number 8    Number of Visits 10    Date for PT Re-Evaluation 01/19/21    PT Start Time 1001    PT Stop Time 1052    PT Time Calculation (min) 51 min    Activity Tolerance Patient tolerated treatment well    Behavior During Therapy [facility] for tasks assessed/performed           Past Medical History:  Diagnosis Date  . Breast cancer ([place])   . Family history of ovarian cancer   . Family history of prostate cancer   . GERD (gastroesophageal reflux disease)   . History of asthma    has albuterol, uses PRN  . History of heart murmur in childhood   . History of multiple allergies     Past Surgical History:  Procedure Laterality Date  . BREAST LUMPECTOMY WITH RADIOACTIVE SEED AND SENTINEL LYMPH NODE BIOPSY Left 11/19/2020   Procedure: LEFT BREAST LUMPECTOMY X 2 WITH RADIOACTIVE SEED AND SENTINEL LYMPH NODE MAPPING;  Surgeon: Erroll Luna, MD;  Location: [facility];  Service: General;  Laterality: Left;  PECTORAL BLOCK  . PORTACATH PLACEMENT Right 06/18/2020   Procedure: INSERTION PORT-A-CATH WITH ULTRASOUND GUIDANCE;  Surgeon: Erroll Luna, MD;  Location: [facility];  Service: General;  Laterality: Right;  . RE-EXCISION OF BREAST LUMPECTOMY Left 12/09/2020   Procedure: RE-EXCISION OF LEFT BREAST LUMPECTOMY;  Surgeon: Erroll Luna, MD;  Location: [facility];  Service: General;  Laterality: Left;    There were no vitals filed for this visit.   Subjective Assessment - 01/08/21 1001    Subjective Had radiation simulation on Tuesdayand it went OK.  Start  radiation March 1st. The cording was hurting this am but I did some stretches and it has improved some.  Chip pack is doing well, but I haven't massaged lately.  Still firm around incision.  going for bra today.    Pertinent History Patient was diagnosed on 05/12/2020 with left breast cancer. She underwent neoadjuvant chemotherapy 06/19/2020-09/23/2020 and had a left lumpectomy and sentinel node biopsy (6 negative nodes) on 11/19/2020.  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    Patient Stated Goals Decrease pain and improve shoulder ROM    Currently in Pain? Yes    Pain Location Arm    Pain Orientation Left    Pain Descriptors / Indicators Tightness;Tender    Pain Onset More than a month ago    Pain Frequency Intermittent    Effect of Pain on Daily Activities disturbed sleep    Multiple Pain Sites No                             [facility] Adult PT Treatment/Exercise - 01/08/21 0001      Shoulder Exercises: Supine   Other Supine Exercises AA shoulder flex and acaption with wand x 5 ea    Other Supine Exercises AROM flex, scaption, horizontal abd x 5 ea      Shoulder Exercises: Standing   External Rotation Strengthening;Both;10 reps    Theraband Level (Shoulder  External Rotation) Level 1 (Yellow)    Extension Strengthening;Both;10 reps    Theraband Level (Shoulder Extension) Level 1 (Yellow)    Retraction Strengthening;Both;10 reps    Theraband Level (Shoulder Retraction) Level 1 (Yellow)      Shoulder Exercises: Pulleys   Flexion 2 minutes    Scaption 1 minute    ABduction 2 minutes      Manual Therapy   Edema Management chip pack made per pt. request for left breast to put in bra    Myofascial Release left axillary region and upper arm    Passive ROM left shoulder flex, abd, IR and ER and D2 flex with VC's to relax                       PT Long Term Goals - 12/08/20 1603      PT LONG TERM GOAL #1   Title Patient will demonstrate she has  regained full shoulder ROM and function post operatively compared to baselines.    Time 6    Period Weeks    Status On-going    Target Date 01/19/21      PT LONG TERM GOAL #2   Title Patient will increase left shoulder active flexion to >/= 135 degrees for increased ease reaching.    Baseline 109 post op; 138 pre-op    Time 6    Period Weeks    Status New    Target Date 01/19/21      PT LONG TERM GOAL #3   Title Patient will increase left shoulder active abduction to >/= 165 degrees for increased ease reaching.    Baseline 138 post op; 163 pre-op    Time 6    Period Weeks    Status New    Target Date 01/19/21      PT LONG TERM GOAL #4   Title Patient will improve her DASH score to be </= 20 for improved overall upper extremity function.    Baseline 70.45 post op; 18.18 pre-op    Time 6    Period Weeks    Status New    Target Date 01/19/21      PT LONG TERM GOAL #5   Title Patient will report >/= 50% decrease in pain for increased tolerance of daily tasks.    Baseline Patient unable to tolerate any work tasks or home tasks currently.    Time 6    Period Weeks    Status New    Target Date 01/19/21                 Plan - 01/08/21 1055    Clinical Impression Statement Firmness at left breast incision decreased substantially in size but still noted at distal incision. She continues with cording and medial arm tenderness and posterior arm numbness.  ROM improves nicely with PROM and exercises.  She did well with new exercises instructed today and Theraband will be added to HEP next visit. She will start radiation next week    Stability/Clinical Decision Making Stable/Uncomplicated    Clinical Decision Making Low    Rehab Potential Excellent    PT Frequency 2x / week    PT Duration 4 weeks    PT Next Visit Plan Add standing TB,Continue MFR, PROM to left shoulder, STW to lateral arm and pecs. (starts radiation next week,check to see if she got compression bra scar  mobilization MLD prn.    PT Home Exercise Plan Post op  shoulder ROM HEP, desensitization techniques    Consulted and Agree with Plan of Care Patient           Patient will benefit from skilled therapeutic intervention in order to improve the following deficits and impairments:  Postural dysfunction,Decreased range of motion,Impaired UE functional use,Pain,Decreased knowledge of precautions,Decreased scar mobility,Increased fascial restricitons,Increased edema,Impaired flexibility  Visit Diagnosis: Malignant neoplasm of upper-outer quadrant of left breast in female, estrogen receptor positive ([place])  Abnormal posture  Aftercare following surgery for neoplasm  Stiffness of left shoulder, not elsewhere classified  Localized edema     Problem List Patient Active Problem List   Diagnosis Date Noted  . Drug-induced neutropenia ([place]) 09/30/2020  . Genetic testing 06/26/2020  . Family history of ovarian cancer   . Family history of prostate cancer   . Malignant neoplasm of upper-outer quadrant of left breast in female, estrogen receptor positive ([place]) 06/03/2020    Claris Pong 01/08/2021, 10:59 AM  [place], [place], [address] Phone: [phone]   Fax:  [phone]  Name: SAUMYA HUKILL MRN: [id number] Date of Birth: [birth year] Cheral Almas, PT 01/08/21 11:00 AM

## 2021-01-12 ENCOUNTER — Encounter: Payer: Self-pay | Admitting: *Deleted

## 2021-01-13 ENCOUNTER — Ambulatory Visit: Payer: 59 | Admitting: Radiation Oncology

## 2021-01-13 ENCOUNTER — Other Ambulatory Visit: Payer: Self-pay

## 2021-01-13 ENCOUNTER — Ambulatory Visit: Payer: 59 | Attending: Surgery

## 2021-01-13 DIAGNOSIS — M25612 Stiffness of left shoulder, not elsewhere classified: Secondary | ICD-10-CM | POA: Diagnosis present

## 2021-01-13 DIAGNOSIS — R600 Localized edema: Secondary | ICD-10-CM | POA: Diagnosis present

## 2021-01-13 DIAGNOSIS — R293 Abnormal posture: Secondary | ICD-10-CM | POA: Diagnosis present

## 2021-01-13 DIAGNOSIS — C50412 Malignant neoplasm of upper-outer quadrant of left female breast: Secondary | ICD-10-CM | POA: Diagnosis not present

## 2021-01-13 DIAGNOSIS — Z170 Estrogen receptor positive status [ER+]: Secondary | ICD-10-CM | POA: Diagnosis present

## 2021-01-13 DIAGNOSIS — Z483 Aftercare following surgery for neoplasm: Secondary | ICD-10-CM | POA: Diagnosis present

## 2021-01-13 NOTE — Therapy (Signed)
[place], [place], [address] Phone: [phone]   Fax:  [phone]  Physical Therapy Treatment  Patient Details  Name: Jeanne Evans MRN: [id number] Date of Birth: 1986/07/25 Referring Provider (PT): Dr. Erroll Luna   Encounter Date: 01/13/2021   PT End of Session - 01/13/21 1221    Visit Number 9    Number of Visits 10    Date for PT Re-Evaluation 01/19/21    PT Start Time 1004    PT Stop Time 1058    PT Time Calculation (min) 54 min    Activity Tolerance Patient tolerated treatment well    Behavior During Therapy [facility] for tasks assessed/performed           Past Medical History:  Diagnosis Date  . Breast cancer ([place])   . Family history of ovarian cancer   . Family history of prostate cancer   . GERD (gastroesophageal reflux disease)   . History of asthma    has albuterol, uses PRN  . History of heart murmur in childhood   . History of multiple allergies     Past Surgical History:  Procedure Laterality Date  . BREAST LUMPECTOMY WITH RADIOACTIVE SEED AND SENTINEL LYMPH NODE BIOPSY Left 11/19/2020   Procedure: LEFT BREAST LUMPECTOMY X 2 WITH RADIOACTIVE SEED AND SENTINEL LYMPH NODE MAPPING;  Surgeon: Erroll Luna, MD;  Location: [facility];  Service: General;  Laterality: Left;  PECTORAL BLOCK  . PORTACATH PLACEMENT Right 06/18/2020   Procedure: INSERTION PORT-A-CATH WITH ULTRASOUND GUIDANCE;  Surgeon: Erroll Luna, MD;  Location: [facility];  Service: General;  Laterality: Right;  . RE-EXCISION OF BREAST LUMPECTOMY Left 12/09/2020   Procedure: RE-EXCISION OF LEFT BREAST LUMPECTOMY;  Surgeon: Erroll Luna, MD;  Location: [facility];  Service: General;  Laterality: Left;    There were no vitals filed for this visit.   Subjective Assessment - 01/13/21 1006    Subjective They called me today and changed my radiation because my  insurance  won't cover more than 21 visits.  I will start next Monday after they have a chance to relook at insurance and figure things out.  Pain in arm is some better.  Cording seems to be improving too. chip pack does seem to soften the incision area and is comfortable for me but I don't have it in  today. Pt got her compression bras and they feel really good.  She is still awaiting her sleeve.    Pertinent History Patient was diagnosed on 05/12/2020 with left breast cancer. She underwent neoadjuvant chemotherapy 06/19/2020-09/23/2020 and had a left lumpectomy and sentinel node biopsy (6 negative nodes) on 11/19/2020.  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    Patient Stated Goals Decrease pain and improve shoulder ROM    Currently in Pain? No/denies    Pain Score 0-No pain    Multiple Pain Sites No                             [facility] Adult PT Treatment/Exercise - 01/13/21 0001      Shoulder Exercises: Supine   Other Supine Exercises AROM  Bilateral flex, scaption, horizontal abd x 5 ea      Manual Therapy   Myofascial Release left axillary region and upper arm    Manual Lymphatic Drainage (MLD) supraclavicular, right axillary LN, left inguinal LN and interaxillary pathway  and axillo inguinal pathway, and left breast retracing all pathways and ending with LN's    Passive ROM left shoulder flex, abd, IR and ER and D2 flex with VC's to relax                       PT Long Term Goals - 12/08/20 1603      PT LONG TERM GOAL #1   Title Patient will demonstrate she has regained full shoulder ROM and function post operatively compared to baselines.    Time 6    Period Weeks    Status On-going    Target Date 01/19/21      PT LONG TERM GOAL #2   Title Patient will increase left shoulder active flexion to >/= 135 degrees for increased ease reaching.    Baseline 109 post op; 138 pre-op    Time 6    Period Weeks    Status New    Target Date 01/19/21       PT LONG TERM GOAL #3   Title Patient will increase left shoulder active abduction to >/= 165 degrees for increased ease reaching.    Baseline 138 post op; 163 pre-op    Time 6    Period Weeks    Status New    Target Date 01/19/21      PT LONG TERM GOAL #4   Title Patient will improve her DASH score to be </= 20 for improved overall upper extremity function.    Baseline 70.45 post op; 18.18 pre-op    Time 6    Period Weeks    Status New    Target Date 01/19/21      PT LONG TERM GOAL #5   Title Patient will report >/= 50% decrease in pain for increased tolerance of daily tasks.    Baseline Patient unable to tolerate any work tasks or home tasks currently.    Time 6    Period Weeks    Status New    Target Date 01/19/21                 Plan - 01/13/21 1222    Clinical Impression Statement Pores in left breast look slightly enlarged today and there appeared to be very mild redness.  MLD was performed today to left breast, and Myofascial release was performed to areas of cording.  PROM has improved today wtih some end range tightness.  Radiation is being held until next week for them to straighten out insurance as her insurance will only cover 21 visits and she requires 31. Firmness noted at incision in area they will irradiate with slight change after MLD. She got compression bras and they feel very comfortable for her.  She is still awaiting her sleeve    Rehab Potential Excellent    PT Frequency 2x / week    PT Duration 4 weeks    PT Treatment/Interventions ADLs/Self Care Home Management;Therapeutic exercise;Patient/family education;Passive range of motion;Manual lymph drainage;Manual techniques;Scar mobilization    PT Next Visit Plan Recert,Continue MFR, PROM to left shoulder, STW to lateral arm and pecs. (starts radiation next week, scar mobilization MLD prn.    PT Home Exercise Plan Post op shoulder ROM HEP, desensitization techniques    Consulted and Agree with Plan of  Care Patient           Patient will benefit from skilled therapeutic intervention in order to improve the following deficits and impairments:  Postural dysfunction,Decreased range  of motion,Impaired UE functional use,Pain,Decreased knowledge of precautions,Decreased scar mobility,Increased fascial restricitons,Increased edema,Impaired flexibility  Visit Diagnosis: Malignant neoplasm of upper-outer quadrant of left breast in female, estrogen receptor positive ([place])  Abnormal posture  Aftercare following surgery for neoplasm  Stiffness of left shoulder, not elsewhere classified  Localized edema     Problem List Patient Active Problem List   Diagnosis Date Noted  . Drug-induced neutropenia ([place]) 09/30/2020  . Genetic testing 06/26/2020  . Family history of ovarian cancer   . Family history of prostate cancer   . Malignant neoplasm of upper-outer quadrant of left breast in female, estrogen receptor positive ([place]) 06/03/2020    Claris Pong 01/13/2021, 12:29 PM  [place], [place], [address] Phone: [phone]   Fax:  [phone]  Name: DOLA LUNSFORD MRN: [id number] Date of Birth: 1986-07-16 Cheral Almas, PT 01/13/21 12:29 PM

## 2021-01-14 ENCOUNTER — Other Ambulatory Visit: Payer: Self-pay | Admitting: Oncology

## 2021-01-14 ENCOUNTER — Ambulatory Visit: Payer: 59

## 2021-01-15 ENCOUNTER — Ambulatory Visit: Payer: 59

## 2021-01-15 ENCOUNTER — Other Ambulatory Visit: Payer: Self-pay

## 2021-01-15 DIAGNOSIS — R293 Abnormal posture: Secondary | ICD-10-CM

## 2021-01-15 DIAGNOSIS — Z170 Estrogen receptor positive status [ER+]: Secondary | ICD-10-CM

## 2021-01-15 DIAGNOSIS — Z483 Aftercare following surgery for neoplasm: Secondary | ICD-10-CM

## 2021-01-15 DIAGNOSIS — C50412 Malignant neoplasm of upper-outer quadrant of left female breast: Secondary | ICD-10-CM

## 2021-01-15 DIAGNOSIS — R600 Localized edema: Secondary | ICD-10-CM

## 2021-01-15 DIAGNOSIS — M25612 Stiffness of left shoulder, not elsewhere classified: Secondary | ICD-10-CM

## 2021-01-15 NOTE — Therapy (Signed)
[place], [place], [address] Phone: [phone]   Fax:  [phone]  Physical Therapy Treatment  Patient Details  Name: Jeanne Evans MRN: [id number] Date of Birth: 1986-08-02 Referring Provider (PT): Dr. Erroll Luna   Encounter Date: 01/15/2021   PT End of Session - 01/15/21 1209    Visit Number 10    Number of Visits 22    Date for PT Re-Evaluation 02/26/21    PT Start Time 1008    PT Stop Time 1055    PT Time Calculation (min) 47 min    Activity Tolerance Patient tolerated treatment well    Behavior During Therapy [facility] for tasks assessed/performed           Past Medical History:  Diagnosis Date  . Breast cancer ([place])   . Family history of ovarian cancer   . Family history of prostate cancer   . GERD (gastroesophageal reflux disease)   . History of asthma    has albuterol, uses PRN  . History of heart murmur in childhood   . History of multiple allergies     Past Surgical History:  Procedure Laterality Date  . BREAST LUMPECTOMY WITH RADIOACTIVE SEED AND SENTINEL LYMPH NODE BIOPSY Left 11/19/2020   Procedure: LEFT BREAST LUMPECTOMY X 2 WITH RADIOACTIVE SEED AND SENTINEL LYMPH NODE MAPPING;  Surgeon: Erroll Luna, MD;  Location: [facility];  Service: General;  Laterality: Left;  PECTORAL BLOCK  . PORTACATH PLACEMENT Right 06/18/2020   Procedure: INSERTION PORT-A-CATH WITH ULTRASOUND GUIDANCE;  Surgeon: Erroll Luna, MD;  Location: [facility];  Service: General;  Laterality: Right;  . RE-EXCISION OF BREAST LUMPECTOMY Left 12/09/2020   Procedure: RE-EXCISION OF LEFT BREAST LUMPECTOMY;  Surgeon: Erroll Luna, MD;  Location: [facility];  Service: General;  Laterality: Left;    There were no vitals filed for this visit.   Subjective Assessment - 01/15/21 1010    Subjective Got my compression sleeve yesterday and it feels so good.  Still  feel the cording and sensitivity in the arm and feel tightness in the axillary region with stretching.  The chip pack seems to help and the area of firmness is smaller at my breast but is still there.    Pertinent History Patient was diagnosed on 05/12/2020 with left breast cancer. She underwent neoadjuvant chemotherapy 06/19/2020-09/23/2020 and had a left lumpectomy and sentinel node biopsy (6 negative nodes) on 11/19/2020.  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    Patient Stated Goals Decrease pain and improve shoulder ROM    Currently in Pain? No/denies    Pain Score 0-No pain    Multiple Pain Sites No              [facility] PT Assessment - 01/15/21 0001      AROM   Left Shoulder Extension 54 Degrees    Left Shoulder Flexion 163 Degrees    Left Shoulder ABduction 150 Degrees    Left Shoulder External Rotation 87 Degrees             LYMPHEDEMA/ONCOLOGY QUESTIONNAIRE - 01/15/21 0001      Left Upper Extremity Lymphedema   At Axilla  35.6 cm    15 cm Proximal to Olecranon Process 35.7 cm    10 cm Proximal to Olecranon Process 35 cm    Olecranon Process 27.2 cm    10 cm Proximal to Ulnar Styloid Process 20.1 cm  Just Proximal to Ulnar Styloid Process 16.1 cm    Across Hand at [employer] 19.3 cm    At [place] of 2nd Digit 6.4 cm              Quick Dash - 01/15/21 0001    Open a tight or new jar Moderate difficulty    Do heavy household chores (wash walls, wash floors) Mild difficulty    Carry a shopping bag or briefcase No difficulty    Wash your back No difficulty    Use a knife to cut food Mild difficulty    Recreational activities in which you take some force or impact through your arm, shoulder, or hand (golf, hammering, tennis) Mild difficulty    During the past week, to what extent has your arm, shoulder or hand problem interfered with your normal social activities with family, friends, neighbors, or groups? Modererately    During the past week, to  what extent has your arm, shoulder or hand problem limited your work or other regular daily activities Modererately    Arm, shoulder, or hand pain. Moderate    Tingling (pins and needles) in your arm, shoulder, or hand Severe    Difficulty Sleeping Moderate difficulty    DASH Score 36.36 %                  [facility] Adult PT Treatment/Exercise - 01/15/21 0001      Shoulder Exercises: Supine   Other Supine Exercises AA shoulder flex and acaption with wand x 5 ea      Manual Therapy   Myofascial Release left axillary region and upper arm    Passive ROM left shoulder flex, abd, IR and ER and D2 flex with VC's to relax                       PT Long Term Goals - 01/15/21 1048      PT LONG TERM GOAL #1   Title Patient will demonstrate she has regained full shoulder ROM and function post operatively compared to baselines.    Time 6    Period Weeks    Status On-going    Target Date 02/26/21      PT LONG TERM GOAL #2   Title Patient will increase left shoulder active flexion to >/= 135 degrees for increased ease reaching.    Baseline 109 post/  163 today    Time 6    Period Weeks    Status Achieved      PT LONG TERM GOAL #3   Title Patient will increase left shoulder active abduction to >/= 165 degrees for increased ease reaching.    Baseline 150 today    Time 6    Period Weeks    Status On-going    Target Date 02/26/21      PT LONG TERM GOAL #4   Title Patient will improve her DASH score to be </= 20 for improved overall upper extremity function.    Baseline 70.45 post op; 18.18 pre-op/  36% today    Time 6    Period Weeks    Status On-going    Target Date 02/26/21      PT LONG TERM GOAL #5   Title Patient will report >/= 50% decrease in pain for increased tolerance of daily tasks.    Baseline 45% better    Time 6    Period Weeks    Status On-going  Target Date 02/05/21                 Plan - 01/15/21 1210    Clinical Impression Statement  Pt has made good progress with left shoulder ROM although cording is still present and limiting abduction the most.  Overall pain has improved by 45% and Quick Dash has improved by 35% from post surgical score.  She continues with breast fibosis especially at incision sites.  She is compliant with compression bra and use of chip pack.  She will benefit from continued therapy to address breast swelling and to progress pt to full ROM and function of her left shoulder.    Stability/Clinical Decision Making Stable/Uncomplicated    Rehab Potential Excellent    PT Frequency 2x / week    PT Duration 6 weeks    PT Treatment/Interventions ADLs/Self Care Home Management;Therapeutic exercise;Patient/family education;Passive range of motion;Manual lymph drainage;Manual techniques;Scar mobilization    PT Next Visit Plan Cont MFR to cording, PROM left shoulder, STM prn to lateral arm/pecs, starts radiation next week, MLD and instruct pt in breast MLD    PT Home Exercise Plan Post op shoulder ROM HEP, desensitization techniques, compression bra and slee    Consulted and Agree with Plan of Care Patient           Patient will benefit from skilled therapeutic intervention in order to improve the following deficits and impairments:  Postural dysfunction,Decreased range of motion,Impaired UE functional use,Pain,Decreased knowledge of precautions,Decreased scar mobility,Increased fascial restricitons,Increased edema,Impaired flexibility  Visit Diagnosis: Malignant neoplasm of upper-outer quadrant of left breast in female, estrogen receptor positive ([place])  Abnormal posture  Aftercare following surgery for neoplasm  Stiffness of left shoulder, not elsewhere classified  Localized edema     Problem List Patient Active Problem List   Diagnosis Date Noted  . Drug-induced neutropenia ([place]) 09/30/2020  . Genetic testing 06/26/2020  . Family history of ovarian cancer   . Family history of prostate cancer   .  Malignant neoplasm of upper-outer quadrant of left breast in female, estrogen receptor positive ([place]) 06/03/2020    Claris Pong 01/15/2021, 12:16 PM  [place], [place], [address] Phone: [phone]   Fax:  [phone]  Name: Jeanne Evans MRN: [id number] Date of Birth: 1986/03/18 Cheral Almas, PT 01/15/21 12:17 PM

## 2021-01-16 ENCOUNTER — Ambulatory Visit: Payer: 59

## 2021-01-18 NOTE — Progress Notes (Signed)
[place]  [phone] [phone]     ID: Jeanne Evans DOB: 1986-03-01  MR#: [id number]  HUD#:[id number]  Patient Care Team: [name], MD as PCP - General (Obstetrics and Gynecology) Rockwell Germany, RN as Oncology Nurse Navigator Mauro Kaufmann, RN as Oncology Nurse Navigator Erroll Luna, MD as Consulting Physician (General Surgery) Aleina Burgio, Virgie Dad, MD as Consulting Physician (Oncology) Kyung Rudd, MD as Consulting Physician (Radiation Oncology) Servando Salina, MD as Consulting Physician (Obstetrics and Gynecology) Chauncey Cruel, MD OTHER MD:  CHIEF COMPLAINT: 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  She is accompanied by her close friend  Since her last visit, she underwent re-excision of the positive margin on 12/09/2020, which found no residual cancer.  She is scheduled to start radiation 01/22/2021. She is here today to discuss concurrent capecitabine   REVIEW OF SYSTEMS: Jeanne Evans is recovering from chemotherapy.  She got a left upper extremity sleeve (letting it cost her $126) which is very attractive and it is really helping with the left upper extremity swelling.  She also has cording and is benefiting from physical therapy.  She tells me she now "can eat" and has less taste perversion.  She still has significant insomnia.  She just cannot get to sleep.  She finds that lorazepam is helpful.  She is not taking gabapentin at present.  She wonders if she will be able to have children in the future.  A detailed review of systems today was otherwise stable   COVID 19 VACCINATION STATUS: not vaccinated as of November 2021   HISTORY OF CURRENT ILLNESS: From the original intake note:  Jeanne Evans herself palpated a painful  left breast lump. She underwent bilateral diagnostic mammography with tomography and left breast ultrasonography at [facility] on 05/12/2020 showing: breast density category B; palpable 2.7 cm oval hypoechoic mass in left breast at 2-3 o'clock; 2.2 cm left axillary lymph node/grouping of nodes.  Accordingly on 05/29/2020 she proceeded to biopsy of the left breast area in question. The pathology from this procedure ([id number]) showed: invasive ductal carcinoma, grade 3. Prognostic indicators significant for: estrogen receptor, 10% positive with weak staining intensity and progesterone receptor, 0% negative. Proliferation marker Ki67 at 95%. HER2 equivocal by immunohistochemistry (2+), but negative by fluorescent in situ hybridization with a signals ratio 1.77 and number per cell 2.65.  Biopsy of the left axillary lymph node in question was negative and concordant  The patient's subsequent history is as detailed below.   PAST MEDICAL HISTORY: Past Medical History:  Diagnosis Date   Breast cancer ([place])    Family history of ovarian cancer    Family history of prostate cancer    GERD (gastroesophageal reflux disease)    History of asthma    has albuterol, uses PRN   History of heart murmur in childhood    History of multiple allergies   History of allergy related headaches   PAST SURGICAL HISTORY: Past Surgical History:  Procedure Laterality Date   BREAST LUMPECTOMY WITH RADIOACTIVE SEED AND SENTINEL LYMPH NODE BIOPSY Left 11/19/2020   Procedure: LEFT BREAST LUMPECTOMY X 2 WITH RADIOACTIVE SEED AND SENTINEL LYMPH NODE MAPPING;  Surgeon: Erroll Luna, MD;  Location: [facility];  Service: General;  Laterality: Left;  PECTORAL BLOCK   PORTACATH PLACEMENT Right 06/18/2020   Procedure: INSERTION  PORT-A-CATH WITH ULTRASOUND GUIDANCE;  Surgeon: Erroll Luna, MD;  Location: [facility];  Service: General;  Laterality: Right;   RE-EXCISION OF BREAST  LUMPECTOMY Left 12/09/2020   Procedure: RE-EXCISION OF LEFT BREAST LUMPECTOMY;  Surgeon: Erroll Luna, MD;  Location: [facility];  Service: General;  Laterality: Left;  She reports having a boil lanced from her perineal area   FAMILY HISTORY: Family History  Problem Relation Age of Onset   Lung cancer Maternal Uncle        dx late 22s   Prostate cancer Paternal Uncle        dx late 65s   Ovarian cancer Paternal Grandmother        dx 76s   Prostate cancer Paternal Grandfather        dx 68s   Prostate cancer Paternal Uncle        dx early 76s  The patient's father is 99 and her mother 60, as of 05/2020.  The patient has two brothers and one sister. She reports ovarian cancer in her paternal grandmother, prostate cancer in her paternal grandfather and a paternal uncle, and lung cancer in a maternal uncle.   GYNECOLOGIC HISTORY:  No LMP recorded. (Menstrual status: IUD). Menarche: 35 years old Age at first live birth: 35 years old [place] P 2 LMP current, experiences spotting Contraceptive: Mirena IUD in place HRT n/a  Hysterectomy? no BSO? 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 She describes herself as single. She lives at home with her children, Jeanne Evans (age 56) and Jeanne Evans (age 10).  She is originally from [place] her home.    ADVANCED DIRECTIVES: Not in place. She intends to name her mother, Jeanne Evans, as her HCPOA.  Jeanne Evans lives in [place] and can be reached at [phone].  The patient was given the appropriate documents to complete and notarized at her discretion at the time of her visit 06/04/2020   HEALTH MAINTENANCE: Social History   Tobacco Use   Smoking status: Never Smoker   Smokeless tobacco: Never Used  Substance Use Topics   Alcohol use: Yes    Comment: occas   Drug use: Yes    Types:  Marijuana     Colonoscopy: n/a (age)  PAP: 04/2020  Bone density: n/a (age)   Allergies  Allergen Reactions   Sulfa Antibiotics Hives    Current Outpatient Medications  Medication Sig Dispense Refill   capecitabine (XELODA) 500 MG tablet Take 2 tablets (1,000 mg total) by mouth 2 (two) times daily after a meal. Take only on radiation days. Start Monday 01/26/2021 84 tablet 0   Acetaminophen (TYLENOL) 325 MG CAPS Take by mouth.     gabapentin (NEURONTIN) 300 MG capsule Take 1 capsule (300 mg total) by mouth at bedtime. 90 capsule 4   ibuprofen (ADVIL) 800 MG tablet Take 1 tablet (800 mg total) by mouth every 8 (eight) hours as needed. 30 tablet 0   levonorgestrel (MIRENA) 20 MCG/24HR IUD 1 each by Intrauterine route once.     magic mouthwash SOLN Take 5 mLs by mouth 4 (four) times daily as needed for mouth pain. 240 mL 1   naproxen sodium (ALEVE) 220 MG tablet Take 220 mg by mouth.     omeprazole (PRILOSEC) 40 MG capsule Take 1 capsule (40 mg total) by mouth at bedtime. 30 capsule 3   venlafaxine (EFFEXOR) 75  MG tablet Take 1 tablet (75 mg total) by mouth 1 day or 1 dose for 1 dose. 90 tablet 4   No current facility-administered medications for this visit.    OBJECTIVE: African-American woman in no acute distress Vitals:   01/19/21 1037  BP: 135/87  Pulse: 73  Resp: 18  Temp: 97.7 F (36.5 C)  SpO2: 99%     Body mass index is 32.49 kg/m.   Wt Readings from Last 3 Encounters:  01/19/21 183 lb 6.4 oz (83.2 kg)  01/06/21 182 lb 12.8 oz (82.9 kg)  12/09/20 183 lb 3.2 oz (83.1 kg)  ECOG FS:1 - Symptomatic but completely ambulatory  Sclerae unicteric, EOMs intact Wearing a mask No cervical or supraclavicular adenopathy Lungs no rales or rhonchi Heart regular rate and rhythm Abd soft, nontender, positive bowel sounds MSK no focal spinal tenderness, wearing a left upper extremity compression sleeve Neuro: nonfocal, well oriented, appropriate affect Breasts: The  right breast is benign.  The left breast is status post lumpectomy.  There is no dehiscence erythema or swelling.  The cosmetic result is good.  Both axillae are benign.   LAB RESULTS:  CMP     Component Value Date/Time   NA 141 01/19/2021 1025   K 3.9 01/19/2021 1025   CL 107 01/19/2021 1025   CO2 26 01/19/2021 1025   GLUCOSE 99 01/19/2021 1025   BUN 11 01/19/2021 1025   CREATININE 0.81 01/19/2021 1025   CREATININE 0.92 06/04/2020 0830   CALCIUM 9.0 01/19/2021 1025   PROT 7.2 01/19/2021 1025   ALBUMIN 4.0 01/19/2021 1025   AST 15 01/19/2021 1025   AST 13 (L) 06/04/2020 0830   ALT 17 01/19/2021 1025   ALT 14 06/04/2020 0830   ALKPHOS 71 01/19/2021 1025   BILITOT 0.5 01/19/2021 1025   BILITOT 0.5 06/04/2020 0830   GFRNONAA >60 01/19/2021 1025   GFRNONAA >60 06/04/2020 0830   GFRAA >60 08/05/2020 0955   GFRAA >60 06/04/2020 0830    No results found for: TOTALPROTELP, ALBUMINELP, A1GS, A2GS, BETS, BETA2SER, GAMS, MSPIKE, SPEI  Lab Results  Component Value Date   WBC 3.1 (L) 01/19/2021   NEUTROABS 1.6 (L) 01/19/2021   HGB 11.9 (L) 01/19/2021   HCT 36.2 01/19/2021   MCV 85.6 01/19/2021   PLT 233 01/19/2021    No results found for: LABCA2  No components found for: [id number]  No results for input(s): INR in the last 168 hours.  No results found for: LABCA2  No results found for: OHY073  No results found for: XTG626  No results found for: RSW546  No results found for: CA2729  No components found for: HGQUANT  No results found for: CEA1 / No results found for: CEA1   No results found for: AFPTUMOR  No results found for: CHROMOGRNA  No results found for: KPAFRELGTCHN, LAMBDASER, KAPLAMBRATIO (kappa/lambda light chains)  No results found for: HGBA, HGBA2QUANT, HGBFQUANT, HGBSQUAN (Hemoglobinopathy evaluation)   No results found for: LDH  No results found for: IRON, TIBC, IRONPCTSAT (Iron and TIBC)  No results found for: FERRITIN  Urinalysis No  results found for: COLORURINE, APPEARANCEUR, LABSPEC, PHURINE, GLUCOSEU, HGBUR, BILIRUBINUR, KETONESUR, PROTEINUR, UROBILINOGEN, NITRITE, LEUKOCYTESUR   STUDIES: No results found.   ELIGIBLE FOR AVAILABLE RESEARCH PROTOCOL: no  ASSESSMENT: 35 y.o. [place] woman status post left breast upper outer quadrant biopsy 05/29/2020 for a clinical T2 N0, stage IIB invasive ductal carcinoma, functionally triple negative, with an MIB-1 of 95%.  (1) genetics testing 06/23/2020 through  the [facility] Multi-Cancer Panel found no deleterious mutations in AIP, ALK, APC, ATM, AXIN2,BAP1,  BARD1, BLM, BMPR1A, BRCA1, BRCA2, BRIP1, CASR, CDC73, CDH1, CDK4, CDKN1B, CDKN1C, CDKN2A (p14ARF), CDKN2A (p16INK4a), CEBPA, CHEK2, CTNNA1, DICER1, DIS3L2, EGFR (c.2369C>T, p.Thr790Met variant only), EPCAM (Deletion/duplication testing only), FH, FLCN, GATA2, GPC3, GREM1 (Promoter region deletion/duplication testing only), HOXB13 (c.251G>A, p.Gly84Glu), HRAS, KIT, MAX, MEN1, MET, MITF (c.952G>A, p.Glu318Lys variant only), MLH1, MSH2, MSH3, MSH6, MUTYH, NBN, NF1, NF2, NTHL1, PALB2, PDGFRA, PHOX2B, PMS2, POLD1, POLE, POT1, PRKAR1A, PTCH1, PTEN, RAD50, RAD51C, RAD51D, RB1, RECQL4, RET, RNF43, RUNX1, SDHAF2, SDHA (sequence changes only), SDHB, SDHC, SDHD, SMAD4, SMARCA4, SMARCB1, SMARCE1, STK11, SUFU, TERC, TERT, TMEM127, TP53, TSC1, TSC2, VHL, WRN and WT1.   (2) neoadjuvant chemotherapy consisting of cyclophosphamide and doxorubicin in dose dense fashion x4 started 06/19/2020, completed 08/05/2020, followed by paclitaxel and carboplatin weekly x12 starting 08/26/2020, completing 4 doses 09/23/2020  (a) echocardiogram on 06/13/2020 showed an EF of 60-65%  (b) paclitaxel and carboplatin discontinued after 4 doses because of neuropathy and cytopenias  (3) status post left lumpectomy and sentinel lymph node sampling 11/19/2020 for a residual yp T1c ypN0 invasive ductal carcinoma involving the superior margin  (a) additional surgery  12/09/2020 cleared the margin in question  (4) postmastectomy radiation to start 01/22/2021: Consider capecitabine sensitization  (5) to start pembrolizumab/Keytruda after the completion of radiation   PLAN: Jeanne Evans is recovering from her chemotherapy treatments.  She tells me that she would prefer not to get back to work until she is "100% back", but they are going to terminate her job in mid April if she does not return.  She is concerned that her left upper extremity is still not fully functional.  She is right-handed.  She says that she has applied for disability and that our medical records people need to afford some material to them.  I have asked that she speak with them today to make sure that that gets done.  We discussed the fact that she had residual disease despite the neoadjuvant treatment.  I think she would benefit from capecitabine and today we discussed the possible toxicities side effects and complications of this agent.  Hopefully we can obtain it for her and she can started 01/19/2021 at 1000 mg twice daily taken only on radiation days.  After she completes radiation we will go to 3 tablets twice daily 14 days on and 7 days off until she completes a total of 4 additional months.  I suggested she go back to gabapentin at bedtime to help her sleep.  I have upped her Effexor to 75 mg and I have refilled her omeprazole at her request  She will return to see [place] in about 2 weeks just to make sure she is tolerating the capecitabine well  She wonders if she will still be able to have children in the future.  The only way to find out would be for her to have the Mirena removed and then wait up to a year.  If her periods return of course she could have children but if a year passes and her periods do not recur that means that the chemotherapy unfortunately had a permanent effect on the ovaries and she would be postmenopausal.  At this point she does not want to have her Mirena removed  however.  Total encounter time 25 minutes.Sarajane Jews C. Antoria Lanza, MD 01/19/21 11:12 AM Medical Oncology and Hematology [facility] [place], [place] [address] Tel. [phone]  Fax. [phone]   I, Wilburn Mylar, am acting as scribe for Dr. Virgie Dad. Gurfateh Mcclain.  I, Lurline Del MD, have reviewed the above documentation for accuracy and completeness, and I agree with the above.   *Total Encounter Time as defined by the Centers for Medicare and Medicaid Services includes, in addition to the face-to-face time of a patient visit (documented in the note above) non-face-to-face time: obtaining and reviewing outside history, ordering and reviewing medications, tests or procedures, care coordination (communications with other health care professionals or caregivers) and documentation in the medical record.

## 2021-01-18 NOTE — Progress Notes (Signed)
[place]  [phone] [phone]     ID: Jeanne Evans DOB: [birth year]  MR#: [id number]  INO#:[id number]  Patient Care Team: [name], MD as PCP - General (Obstetrics and Gynecology) Rockwell Germany, RN as Oncology Nurse Navigator Mauro Kaufmann, RN as Oncology Nurse Navigator Erroll Luna, MD as Consulting Physician (General Surgery) Paiten Boies, Virgie Dad, MD as Consulting Physician (Oncology) Kyung Rudd, MD as Consulting Physician (Radiation Oncology) Servando Salina, MD as Consulting Physician (Obstetrics and Gynecology) Chauncey Cruel, MD OTHER MD:  CHIEF COMPLAINT: 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  She is accompanied by her close friend  She received 4 doses of weekly carboplatin/paclitaxel before developing neuropathy and cytopenia, which led to discontinuation of neoadjuvant treatment.  She proceeded to left lumpectomy on 11/19/2020 under Dr. Brantley Stage. Pathology from the procedure ([id number]) showed: invasive ductal carcinoma, 1.4 cm, grade 3; superior margin focally involved by carcinoma. Additional surgery 12/09/2020 found no residual cancer.  All 6 biopsied lymph nodes removed 11/19/2020 were negative for carcinoma (0/6).  She is scheduled to start radiation 01/22/2021. She is here today to discuss concurrent capecitabine  REVIEW OF SYSTEMS: Sylvi did well with the surgery in the breast but has significant pain involving the left axilla, medial left upper arm, and actually neuropathic discomfort extending down the entire left arm into her hand.  She received some narcotics for this which worked initially but then caused her to not be able to sleep so she was placed on gabapentin.  That is working some but she remains very uncomfortable and is  clearly protecting the left upper extremity.  She says she does have a sling although she was not wearing it today.  She has had no fever no bleeding no swelling and no erythema.  She is aware that she will need additional surgery before starting radiation.  She is concerned that she will need to stay out of work longer than originally planned.  A detailed review of systems today was otherwise stable   COVID 19 VACCINATION STATUS: not vaccinated as of November 2021   HISTORY OF CURRENT ILLNESS: From the original intake note:  [name] herself palpated a painful left breast lump. She underwent bilateral diagnostic mammography with tomography and left breast ultrasonography at [facility] on 05/12/2020 showing: breast density category B; palpable 2.7 cm oval hypoechoic mass in left breast at 2-3 o'clock; 2.2 cm left axillary lymph node/grouping of nodes.  Accordingly on 05/29/2020 she proceeded to biopsy of the left breast area in question. The pathology from this procedure ([id number]) showed: invasive ductal carcinoma, grade 3. Prognostic indicators significant for: estrogen receptor, 10% positive with weak staining intensity and progesterone receptor, 0% negative. Proliferation marker Ki67 at 95%. HER2 equivocal by immunohistochemistry (2+), but negative by fluorescent in situ hybridization with a signals ratio 1.77 and number per cell 2.65.  Biopsy of the left axillary lymph node in question was negative and concordant  The patient's subsequent history is as detailed below.   PAST MEDICAL HISTORY: Past Medical History:  Diagnosis Date  . Breast cancer ([place])   . Family history of ovarian cancer   . Family history of prostate cancer   . GERD (gastroesophageal reflux disease)   . History of asthma    has albuterol, uses PRN  . History of heart murmur in childhood   .  History of multiple allergies   History of allergy related headaches   PAST SURGICAL HISTORY: Past  Surgical History:  Procedure Laterality Date  . BREAST LUMPECTOMY WITH RADIOACTIVE SEED AND SENTINEL LYMPH NODE BIOPSY Left 11/19/2020   Procedure: LEFT BREAST LUMPECTOMY X 2 WITH RADIOACTIVE SEED AND SENTINEL LYMPH NODE MAPPING;  Surgeon: Cornett, Thomas, MD;  Location: [facility];  Service: General;  Laterality: Left;  PECTORAL BLOCK  . PORTACATH PLACEMENT Right 06/18/2020   Procedure: INSERTION PORT-A-CATH WITH ULTRASOUND GUIDANCE;  Surgeon: Cornett, Thomas, MD;  Location: [facility];  Service: General;  Laterality: Right;  . RE-EXCISION OF BREAST LUMPECTOMY Left 12/09/2020   Procedure: RE-EXCISION OF LEFT BREAST LUMPECTOMY;  Surgeon: Cornett, Thomas, MD;  Location:  SURGERY CENTER;  Service: General;  Laterality: Left;  She reports having a boil lanced from her perineal area   FAMILY HISTORY: Family History  Problem Relation Age of Onset  . Lung cancer Maternal Uncle        dx late 50s  . Prostate cancer Paternal Uncle        dx late 60s  . Ovarian cancer Paternal Grandmother        dx 70s  . Prostate cancer Paternal Grandfather        dx 70s  . Prostate cancer Paternal Uncle        dx early 70s  The patient's father is 64 and her mother 60, as of 05/2020.  The patient has two brothers and one sister. She reports ovarian cancer in her paternal grandmother, prostate cancer in her paternal grandfather and a paternal uncle, and lung cancer in a maternal uncle.   GYNECOLOGIC HISTORY:  No LMP recorded. (Menstrual status: IUD). Menarche: 35 years old Age at first live birth: 35 years old GX P 2 LMP current, experiences spotting Contraceptive: Mirena IUD in place HRT n/a  Hysterectomy? no BSO? 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 She describes herself as single. She lives at home with her children, Jeanne Evans (age 37) and Jeanne Evans (age 12).   She is originally from [place] and considers [place] her home.    ADVANCED DIRECTIVES: Not in place. She intends to name her mother, Jeanne Evans, as her HCPOA.  Jeanne lives in [place] and can be reached at [phone].  The patient was given the appropriate documents to complete and notarized at her discretion at the time of her visit 06/04/2020   HEALTH MAINTENANCE: Social History   Tobacco Use  . Smoking status: Never Smoker  . Smokeless tobacco: Never Used  Substance Use Topics  . Alcohol use: Yes    Comment: occas  . Drug use: Yes    Types: Marijuana     Colonoscopy: n/a (age)  PAP: 04/2020  Bone density: n/a (age)   Allergies  Allergen Reactions  . Sulfa Antibiotics Hives    Current Outpatient Medications  Medication Sig Dispense Refill  . Acetaminophen (TYLENOL) 325 MG CAPS Take by mouth.    . gabapentin (NEURONTIN) 300 MG capsule Take 300 mg by mouth 3 (three) times daily.    . ibuprofen (ADVIL) 800 MG tablet Take 1 tablet (800 mg total) by mouth every 8 (eight) hours as needed. 30 tablet 0  . levonorgestrel (MIRENA) 20 MCG/24HR IUD 1 each by Intrauterine route once.    . LORazepam (ATIVAN) 0.5 MG tablet Take 1 tablet (0.5 mg total) by   mouth every 8 (eight) hours. 20 tablet 0  . magic mouthwash SOLN Take 5 mLs by mouth 4 (four) times daily as needed for mouth pain. 240 mL 1  . naproxen sodium (ALEVE) 220 MG tablet Take 220 mg by mouth.    . omeprazole (PRILOSEC) 40 MG capsule Take 1 capsule (40 mg total) by mouth at bedtime. 7 capsule 0  . prochlorperazine (COMPAZINE) 10 MG tablet Take 1 tablet (10 mg total) by mouth every 6 (six) hours as needed (Nausea or vomiting). 30 tablet 1  . venlafaxine (EFFEXOR) 37.5 MG tablet Take 1 tablet (37.5 mg total) by mouth 2 (two) times daily. 60 tablet 0   No current facility-administered medications for this visit.    OBJECTIVE: African-American woman in no acute distress There were no vitals filed for this visit.    There is no height or weight on file to calculate BMI.   Wt Readings from Last 3 Encounters:  01/06/21 182 lb 12.8 oz (82.9 kg)  12/09/20 183 lb 3.2 oz (83.1 kg)  12/02/20 180 lb 3.2 oz (81.7 kg)  ECOG FS:1 - Symptomatic but completely ambulatory  Sclerae unicteric, EOMs intact Wearing a mask No cervical or supraclavicular adenopathy Lungs no rales or rhonchi Heart regular rate and rhythm Abd soft, nontender, positive bowel sounds MSK no focal spinal tenderness, no upper extremity lymphedema Neuro: nonfocal, well oriented, appropriate affect Breasts: The right breast is unremarkable.  The left breast is status post recent lumpectomy.  The cosmetic result is excellent.  The incisions are healing nicely, with no dehiscence erythema or swelling.  Both axillae are benign   LAB RESULTS:  CMP     Component Value Date/Time   NA 140 12/02/2020 1349   K 4.0 12/02/2020 1349   CL 106 12/02/2020 1349   CO2 26 12/02/2020 1349   GLUCOSE 103 (H) 12/02/2020 1349   BUN 10 12/02/2020 1349   CREATININE 0.83 12/02/2020 1349   CREATININE 0.92 06/04/2020 0830   CALCIUM 9.0 12/02/2020 1349   PROT 7.5 12/02/2020 1349   ALBUMIN 3.8 12/02/2020 1349   AST 19 12/02/2020 1349   AST 13 (L) 06/04/2020 0830   ALT 22 12/02/2020 1349   ALT 14 06/04/2020 0830   ALKPHOS 64 12/02/2020 1349   BILITOT 0.3 12/02/2020 1349   BILITOT 0.5 06/04/2020 0830   GFRNONAA >60 12/02/2020 1349   GFRNONAA >60 06/04/2020 0830   GFRAA >60 08/05/2020 0955   GFRAA >60 06/04/2020 0830    No results found for: TOTALPROTELP, ALBUMINELP, A1GS, A2GS, BETS, BETA2SER, GAMS, MSPIKE, SPEI  Lab Results  Component Value Date   WBC 3.7 (L) 12/02/2020   NEUTROABS 1.3 (L) 12/02/2020   HGB 11.5 (L) 12/02/2020   HCT 34.9 (L) 12/02/2020   MCV 91.4 12/02/2020   PLT 271 12/02/2020    No results found for: LABCA2  No components found for: LABCAN125  No results for input(s): INR in the last 168 hours.  No results found for:  LABCA2  No results found for: CAN199  No results found for: CAN125  No results found for: CAN153  No results found for: CA2729  No components found for: HGQUANT  No results found for: CEA1 / No results found for: CEA1   No results found for: AFPTUMOR  No results found for: CHROMOGRNA  No results found for: KPAFRELGTCHN, LAMBDASER, KAPLAMBRATIO (kappa/lambda light chains)  No results found for: HGBA, HGBA2QUANT, HGBFQUANT, HGBSQUAN (Hemoglobinopathy evaluation)   No results found for: LDH  No results   found for: IRON, TIBC, IRONPCTSAT (Iron and TIBC)  No results found for: FERRITIN  Urinalysis No results found for: COLORURINE, APPEARANCEUR, LABSPEC, PHURINE, GLUCOSEU, HGBUR, BILIRUBINUR, KETONESUR, PROTEINUR, UROBILINOGEN, NITRITE, LEUKOCYTESUR   STUDIES: No results found.   ELIGIBLE FOR AVAILABLE RESEARCH PROTOCOL: no  ASSESSMENT: 34 y.o. [place] woman status post left breast upper outer quadrant biopsy 05/29/2020 for a clinical T2 N0, stage IIB invasive ductal carcinoma, functionally triple negative, with an MIB-1 of 95%.  (1) genetics testing 06/23/2020 through the [facility] Multi-Cancer Panel found no deleterious mutations in AIP, ALK, APC, ATM, AXIN2,BAP1,  BARD1, BLM, BMPR1A, BRCA1, BRCA2, BRIP1, CASR, CDC73, CDH1, CDK4, CDKN1B, CDKN1C, CDKN2A (p14ARF), CDKN2A (p16INK4a), CEBPA, CHEK2, CTNNA1, DICER1, DIS3L2, EGFR (c.2369C>T, p.Thr790Met variant only), EPCAM (Deletion/duplication testing only), FH, FLCN, GATA2, GPC3, GREM1 (Promoter region deletion/duplication testing only), HOXB13 (c.251G>A, p.Gly84Glu), HRAS, KIT, MAX, MEN1, MET, MITF (c.952G>A, p.Glu318Lys variant only), MLH1, MSH2, MSH3, MSH6, MUTYH, NBN, NF1, NF2, NTHL1, PALB2, PDGFRA, PHOX2B, PMS2, POLD1, POLE, POT1, PRKAR1A, PTCH1, PTEN, RAD50, RAD51C, RAD51D, RB1, RECQL4, RET, RNF43, RUNX1, SDHAF2, SDHA (sequence changes only), SDHB, SDHC, SDHD, SMAD4, SMARCA4, SMARCB1, SMARCE1, STK11, SUFU, TERC, TERT,  TMEM127, TP53, TSC1, TSC2, VHL, WRN and WT1.   (2) neoadjuvant chemotherapy consisting of cyclophosphamide and doxorubicin in dose dense fashion x4 started 06/19/2020, completed 08/05/2020, followed by paclitaxel and carboplatin weekly x12 starting 08/26/2020, completing 4 doses 09/23/2020  (a) echocardiogram on 06/13/2020 showed an EF of 60-65%  (b) paclitaxel and carboplatin discontinued after 4 doses because of neuropathy and cytopenias  (3) status post left lumpectomy and sentinel lymph node sampling 11/19/2020 for a residual ypT1c ypN0 invasive ductal carcinoma involving the superior margin  (a) additional surgery 12/09/2020 cleared the margin in question  (4) postmastectomy radiation to start 01/22/2021: Consider capecitabine sensitization  (5) to start pembrolizumab/Keytruda after the completion of radiation   PLAN: Dezaria had some residual tumor in the breast despite her neoadjuvant chemotherapy.  It is favorable that the lymph nodes were negative.  Nevertheless I think she will benefit from some neoadjuvant treatment.  After she gets her margins cleared she will proceed to radiation.  I will check with Dr. Squire her radiation oncologist whether she thinks we should add sensitizing capecitabine to the radiation treatments.  After radiation Shuvon will start Keytruda every 21 days.  We will do that a minimum of a year.  She has her port still in place which is useful.  Today we discussed very briefly some of the possible toxicities side effects and complications of this medication  Right now she is quite uncomfortable because of the pain in her left upper extremity.  The gabapentin is helping.  I think it would help if she took Aleve 220 mg together with Tylenol 500 mg.  She may repeat this up to 3 times a day, preferably with some food.  I think between that and the gabapentin she may be able to get enough pain control.  She will see me again in mid March unless she needs  capecitabine in which case we will schedule a visit to discuss that medication in more detail  I will also set her up for port flush in 6 weeks.  Total encounter time 35 minutes.*   Gustav C. Magrinat, MD 01/18/21 7:21 PM Medical Oncology and Hematology [facility] [address] [place], [place] [address] Tel. [phone]    Fax. [phone]   I, Katie Daubenspeck, am acting as scribe for Dr. Gustav C. Magrinat.  I, Gustav   Maresa Morash MD, have reviewed the above documentation for accuracy and completeness, and I agree with the above.   *Total Encounter Time as defined by the Centers for Medicare and Medicaid Services includes, in addition to the face-to-face time of a patient visit (documented in the note above) non-face-to-face time: obtaining and reviewing outside history, ordering and reviewing medications, tests or procedures, care coordination (communications with other health care professionals or caregivers) and documentation in the medical record.

## 2021-01-19 ENCOUNTER — Other Ambulatory Visit: Payer: Self-pay

## 2021-01-19 ENCOUNTER — Inpatient Hospital Stay: Payer: 59

## 2021-01-19 ENCOUNTER — Ambulatory Visit: Payer: 59 | Admitting: Radiation Oncology

## 2021-01-19 ENCOUNTER — Telehealth: Payer: Self-pay

## 2021-01-19 ENCOUNTER — Other Ambulatory Visit: Payer: Self-pay | Admitting: Oncology

## 2021-01-19 ENCOUNTER — Inpatient Hospital Stay: Payer: 59 | Attending: Oncology

## 2021-01-19 ENCOUNTER — Telehealth: Payer: Self-pay | Admitting: Pharmacist

## 2021-01-19 ENCOUNTER — Inpatient Hospital Stay (HOSPITAL_BASED_OUTPATIENT_CLINIC_OR_DEPARTMENT_OTHER): Payer: 59 | Admitting: Oncology

## 2021-01-19 VITALS — BP 135/87 | HR 73 | Temp 97.7°F | Resp 18 | Ht 63.0 in | Wt 183.4 lb

## 2021-01-19 DIAGNOSIS — Z801 Family history of malignant neoplasm of trachea, bronchus and lung: Secondary | ICD-10-CM | POA: Insufficient documentation

## 2021-01-19 DIAGNOSIS — Z9012 Acquired absence of left breast and nipple: Secondary | ICD-10-CM | POA: Insufficient documentation

## 2021-01-19 DIAGNOSIS — Z79899 Other long term (current) drug therapy: Secondary | ICD-10-CM | POA: Diagnosis not present

## 2021-01-19 DIAGNOSIS — C50412 Malignant neoplasm of upper-outer quadrant of left female breast: Secondary | ICD-10-CM

## 2021-01-19 DIAGNOSIS — Z8042 Family history of malignant neoplasm of prostate: Secondary | ICD-10-CM | POA: Insufficient documentation

## 2021-01-19 DIAGNOSIS — Z923 Personal history of irradiation: Secondary | ICD-10-CM | POA: Insufficient documentation

## 2021-01-19 DIAGNOSIS — Z793 Long term (current) use of hormonal contraceptives: Secondary | ICD-10-CM | POA: Insufficient documentation

## 2021-01-19 DIAGNOSIS — Z8041 Family history of malignant neoplasm of ovary: Secondary | ICD-10-CM | POA: Insufficient documentation

## 2021-01-19 DIAGNOSIS — Z170 Estrogen receptor positive status [ER+]: Secondary | ICD-10-CM

## 2021-01-19 DIAGNOSIS — Z171 Estrogen receptor negative status [ER-]: Secondary | ICD-10-CM | POA: Insufficient documentation

## 2021-01-19 DIAGNOSIS — Z9221 Personal history of antineoplastic chemotherapy: Secondary | ICD-10-CM | POA: Diagnosis not present

## 2021-01-19 LAB — CBC WITH DIFFERENTIAL/PLATELET
Abs Immature Granulocytes: 0 10*3/uL (ref 0.00–0.07)
Basophils Absolute: 0 10*3/uL (ref 0.0–0.1)
Basophils Relative: 1 %
Eosinophils Absolute: 0.2 10*3/uL (ref 0.0–0.5)
Eosinophils Relative: 8 %
HCT: 36.2 % (ref 36.0–46.0)
Hemoglobin: 11.9 g/dL — ABNORMAL LOW (ref 12.0–15.0)
Immature Granulocytes: 0 %
Lymphocytes Relative: 44 %
Lymphs Abs: 1.4 10*3/uL (ref 0.7–4.0)
MCH: 28.1 pg (ref 26.0–34.0)
MCHC: 32.9 g/dL (ref 30.0–36.0)
MCV: 85.6 fL (ref 80.0–100.0)
Monocytes Absolute: 0.3 10*3/uL (ref 0.1–1.0)
Monocytes Relative: 10 %
Neutro Abs: 1.6 10*3/uL — ABNORMAL LOW (ref 1.7–7.7)
Neutrophils Relative %: 37 %
Platelets: 233 10*3/uL (ref 150–400)
RBC: 4.23 MIL/uL (ref 3.87–5.11)
RDW: 14.6 % (ref 11.5–15.5)
WBC: 3.1 10*3/uL — ABNORMAL LOW (ref 4.0–10.5)
nRBC: 0 % (ref 0.0–0.2)

## 2021-01-19 LAB — COMPREHENSIVE METABOLIC PANEL
ALT: 17 U/L (ref 0–44)
AST: 15 U/L (ref 15–41)
Albumin: 4 g/dL (ref 3.5–5.0)
Alkaline Phosphatase: 71 U/L (ref 38–126)
Anion gap: 8 (ref 5–15)
BUN: 11 mg/dL (ref 6–20)
CO2: 26 mmol/L (ref 22–32)
Calcium: 9 mg/dL (ref 8.9–10.3)
Chloride: 107 mmol/L (ref 98–111)
Creatinine, Ser: 0.81 mg/dL (ref 0.44–1.00)
GFR, Estimated: >60 mL/min (ref >60)
Glucose, Bld: 99 mg/dL (ref 70–99)
Potassium: 3.9 mmol/L (ref 3.5–5.1)
Sodium: 141 mmol/L (ref 135–145)
Total Bilirubin: 0.5 mg/dL (ref 0.3–1.2)
Total Protein: 7.2 g/dL (ref 6.5–8.1)

## 2021-01-19 MED ORDER — GABAPENTIN 300 MG PO CAPS: 300.0000 mg | ORAL_CAPSULE | Freq: Every day | ORAL | 4 refills | Status: DC

## 2021-01-19 MED ORDER — VENLAFAXINE HCL 75 MG PO TABS: 75.0000 mg | ORAL_TABLET | ORAL | 4 refills | Status: DC

## 2021-01-19 MED ORDER — OMEPRAZOLE 40 MG PO CPDR: 40.0000 mg | DELAYED_RELEASE_CAPSULE | Freq: Every day | ORAL | 3 refills | Status: DC

## 2021-01-19 MED ORDER — CAPECITABINE 500 MG PO TABS: 1000.0000 mg | ORAL_TABLET | Freq: Two times a day (BID) | ORAL | 0 refills | Status: DC

## 2021-01-19 MED ORDER — HEPARIN SOD (PORK) LOCK FLUSH 100 UNIT/ML IV SOLN
500.0000 [IU] | Freq: Once | INTRAVENOUS | Status: AC
  Administered 2021-01-19: 500 [IU] via INTRAVENOUS
  Filled 2021-01-19: qty 5

## 2021-01-19 MED ORDER — SODIUM CHLORIDE 0.9% FLUSH
10.0000 mL | INTRAVENOUS | Status: DC | PRN
  Administered 2021-01-19: 10 mL via INTRAVENOUS
  Filled 2021-01-19: qty 10

## 2021-01-19 NOTE — Telephone Encounter (Signed)
Oral Oncology Patient Advocate Encounter  After completing a benefits investigation, prior authorization for Xeloda is not required at this time through [employer].  Patient's copay is $0.     [place] Patient [place] Phone [phone] Fax [phone] 01/19/2021 11:23 AM

## 2021-01-19 NOTE — Telephone Encounter (Signed)
Oral Oncology Pharmacist Encounter  Received new prescription for Xeloda (capecitabine) for the adjuvant treatment of triple negative breast cancer in conjunction with radiation, planned for duration of radiation, followed by 4 additional months after completion of radiation.  Prescription dose and frequency assessed for appropriateness. Appropriate for therapy initiation.   CBC w/ Diff and CMP from 01/19/21 assessed, labs OK for treatment initiation.  Current medication list in Epic reviewed, DDIs with Xeloda identified: Category C DDI between Xeloda and Omeprazole - proton-pump inhibitors can decrease efficacy of Xeloda - will discuss with patient alternatives to omeprazole, such as H2RA's like famotidine while on Xeloda.  Evaluated chart and no patient barriers to medication adherence noted.   Prescription has been e-scribed to the [facility] for benefits analysis and approval.  Oral Oncology Clinic will continue to follow for insurance authorization, copayment issues, initial counseling and start date.  Leron Croak, PharmD, BCPS Hematology/Oncology Clinical Pharmacist [facility] [phone] 01/19/2021 12:51 PM

## 2021-01-20 ENCOUNTER — Ambulatory Visit: Payer: 59

## 2021-01-20 ENCOUNTER — Telehealth: Payer: Self-pay

## 2021-01-20 ENCOUNTER — Telehealth: Payer: Self-pay | Admitting: Oncology

## 2021-01-20 DIAGNOSIS — R293 Abnormal posture: Secondary | ICD-10-CM

## 2021-01-20 DIAGNOSIS — C50412 Malignant neoplasm of upper-outer quadrant of left female breast: Secondary | ICD-10-CM

## 2021-01-20 DIAGNOSIS — Z170 Estrogen receptor positive status [ER+]: Secondary | ICD-10-CM

## 2021-01-20 DIAGNOSIS — R600 Localized edema: Secondary | ICD-10-CM

## 2021-01-20 DIAGNOSIS — Z483 Aftercare following surgery for neoplasm: Secondary | ICD-10-CM

## 2021-01-20 DIAGNOSIS — M25612 Stiffness of left shoulder, not elsewhere classified: Secondary | ICD-10-CM

## 2021-01-20 LAB — FOLLICLE STIMULATING HORMONE: FSH: 75.3 m[IU]/mL

## 2021-01-20 NOTE — Patient Instructions (Signed)

## 2021-01-20 NOTE — Therapy (Signed)
[place], [place], [address] Phone: [phone]   Fax:  [phone]  Physical Therapy Treatment  Patient Details  Name: Jeanne Evans MRN: [id number] Date of Birth: [birth year] Referring Provider (PT): Dr. Erroll Luna   Encounter Date: 01/20/2021   PT End of Session - 01/20/21 0749    Visit Number 11    Number of Visits 22    Date for PT Re-Evaluation 02/26/21    PT Start Time 0709    PT Stop Time 0800    PT Time Calculation (min) 51 min    Activity Tolerance Patient tolerated treatment well    Behavior During Therapy [facility] for tasks assessed/performed           Past Medical History:  Diagnosis Date  . Breast cancer ([place])   . Family history of ovarian cancer   . Family history of prostate cancer   . GERD (gastroesophageal reflux disease)   . History of asthma    has albuterol, uses PRN  . History of heart murmur in childhood   . History of multiple allergies     Past Surgical History:  Procedure Laterality Date  . BREAST LUMPECTOMY WITH RADIOACTIVE SEED AND SENTINEL LYMPH NODE BIOPSY Left 11/19/2020   Procedure: LEFT BREAST LUMPECTOMY X 2 WITH RADIOACTIVE SEED AND SENTINEL LYMPH NODE MAPPING;  Surgeon: Erroll Luna, MD;  Location: [facility];  Service: General;  Laterality: Left;  PECTORAL BLOCK  . PORTACATH PLACEMENT Right 06/18/2020   Procedure: INSERTION PORT-A-CATH WITH ULTRASOUND GUIDANCE;  Surgeon: Erroll Luna, MD;  Location: [place];  Service: General;  Laterality: Right;  . RE-EXCISION OF BREAST LUMPECTOMY Left 12/09/2020   Procedure: RE-EXCISION OF LEFT BREAST LUMPECTOMY;  Surgeon: Erroll Luna, MD;  Location: [facility];  Service: General;  Laterality: Left;    There were no vitals filed for this visit.   Subjective Assessment - 01/20/21 0710    Subjective My work is telling me if I don't go back by April 12 that I may  loose my job. I am so stressed. I talked with Dr. Jana Hakim yesterday.  My disability case worker is supposed to get info from [facility] to expedite things. I might have to take a chemo pill 2x a day on my radiation days.  I am supposed to go tomorrow for radiation unless they call me.  Have pulling in forearm all the way to shoulder today with tightness but not pain.  Hurts to reach for shower curtain. The sleeve feels good on my arm.    Pertinent History Patient was diagnosed on 05/12/2020 with left breast cancer. She underwent neoadjuvant chemotherapy 06/19/2020-09/23/2020 and had a left lumpectomy and sentinel node biopsy (6 negative nodes) on 11/19/2020.  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    Currently in Pain? No/denies    Pain Score 0-No pain    Pain Onset More than a month ago    Pain Frequency Intermittent    Multiple Pain Sites No    Pain Descriptors / Indicators Tightness                             [facility] Adult PT Treatment/Exercise - 01/20/21 0001      Shoulder Exercises: Supine   Other Supine Exercises AROM  Bilateral flex, scaption, horizontal abd x 5 ea      Manual Therapy   Soft  tissue mobilization to scar region of area of firmness    Myofascial Release left axillary region and upper arm and forearm    Passive ROM left shoulder flex, abd, IR and ER and D2 flex with VC's to relax                  PT Education - 01/20/21 0801    Education Details Pt. was educated in supine scapular series and was fiven illustrated and written instructions and yellow band    Person(s) Educated Patient    Methods Explanation;Handout;Demonstration    Comprehension Verbalized understanding               PT Long Term Goals - 01/15/21 1048      PT LONG TERM GOAL #1   Title Patient will demonstrate she has regained full shoulder ROM and function post operatively compared to baselines.    Time 6    Period Weeks    Status On-going    Target Date  02/26/21      PT LONG TERM GOAL #2   Title Patient will increase left shoulder active flexion to >/= 135 degrees for increased ease reaching.    Baseline 109 post/  163 today    Time 6    Period Weeks    Status Achieved      PT LONG TERM GOAL #3   Title Patient will increase left shoulder active abduction to >/= 165 degrees for increased ease reaching.    Baseline 150 today    Time 6    Period Weeks    Status On-going    Target Date 02/26/21      PT LONG TERM GOAL #4   Title Patient will improve her DASH score to be </= 20 for improved overall upper extremity function.    Baseline 70.45 post op; 18.18 pre-op/  36% today    Time 6    Period Weeks    Status On-going    Target Date 02/26/21      PT LONG TERM GOAL #5   Title Patient will report >/= 50% decrease in pain for increased tolerance of daily tasks.    Baseline 45% better    Time 6    Period Weeks    Status On-going    Target Date 02/05/21                 Plan - 01/20/21 0751    Clinical Impression Statement Pt with increased tightness from forearm to upper arm today with extensive pulling from cording which did loosen up with manual work and stretching. She did well with theraband exs and was given illustrated and written instructions to do at home.  Large firm nodular swelling under incision in area they will irradiate.  Pt is compliant with compression bra and gets comfort with sleeve on her arm.    Stability/Clinical Decision Making Stable/Uncomplicated    Rehab Potential Excellent    PT Frequency 2x / week    PT Duration 6 weeks    PT Treatment/Interventions ADLs/Self Care Home Management;Therapeutic exercise;Patient/family education;Passive range of motion;Manual lymph drainage;Manual techniques;Scar mobilization    PT Next Visit Plan Cont MFR to cording, PROM left shoulder, STM prn to lateral arm/pecs, starts radiation this week, MLD and instruct pt in breast MLD    PT Home Exercise Plan Post op shoulder  ROM HEP, desensitization techniques, compression bra and sleeve, supine scap. series.    Consulted and Agree with Plan of Care Patient  Patient will benefit from skilled therapeutic intervention in order to improve the following deficits and impairments:  Postural dysfunction,Decreased range of motion,Impaired UE functional use,Pain,Decreased knowledge of precautions,Decreased scar mobility,Increased fascial restricitons,Increased edema,Impaired flexibility  Visit Diagnosis: Malignant neoplasm of upper-outer quadrant of left breast in female, estrogen receptor positive ([place])  Abnormal posture  Aftercare following surgery for neoplasm  Stiffness of left shoulder, not elsewhere classified  Localized edema     Problem List Patient Active Problem List   Diagnosis Date Noted  . Drug-induced neutropenia ([place]) 09/30/2020  . Genetic testing 06/26/2020  . Family history of ovarian cancer   . Family history of prostate cancer   . Malignant neoplasm of upper-outer quadrant of left breast in female, estrogen receptor positive ([place]) 06/03/2020    Claris Pong 01/20/2021, 12:09 PM  [place], [place], [address] Phone: [phone]   Fax:  [phone]  Name: Jeanne Evans MRN: [id number] Date of Birth: 04-19-1986  Cheral Almas, PT 01/20/21 12:10 PM

## 2021-01-20 NOTE — Telephone Encounter (Signed)
Scheduled appts per 3/7 los. Pt confirmed appt date and time.

## 2021-01-20 NOTE — Telephone Encounter (Signed)
Called pharmacy to change Effexor prescription to capsules instead of tablets. Pharmacy ok to do that. Contacted pt to let her know.

## 2021-01-21 ENCOUNTER — Ambulatory Visit: Payer: 59

## 2021-01-21 MED FILL — CAPECITABINE 500 MG TABLET: 500 | 28 days supply | Qty: 84 | Fill #0

## 2021-01-21 NOTE — Telephone Encounter (Signed)
Oral Chemotherapy Pharmacist Encounter  I spoke with patient for overview of: Xeloda for the adjuvant treatment of triple negative breast cancer in conjunction with radiation, planned for duration of radiation, followed by 4 additional months after completion of radiation.  Counseled patient on administration, dosing, side effects, monitoring, drug-food interactions, safe handling, storage, and disposal.  Patient will take Xeloda 500mg  tablets, 2 tablets (1000mg ) by mouth in AM and 2 tabs (1000mg ) by mouth in PM, within 30 minutes of finishing meals, on days of radiation only.  Patient mentioned that she has had difficulty swallowing some tablets in the past. Patient will call me once she has received the medication and we will discuss dissolving Xeloda in water if the tablets are too large for patient to take.   Xeloda start date: 01/26/21  Adverse effects of Xeloda include but are not limited to: fatigue, decreased blood counts, GI upset, diarrhea, mouth sores, and hand-foot syndrome.  Patient will obtain anti diarrheal and alert the office of 4 or more loose stools above baseline.   Reviewed with patient importance of keeping a medication schedule and plan for any missed doses. No barriers to medication adherence identified.  Medication reconciliation performed and medication/allergy list updated. Discussed with patient drug drug interaction between omeprazole and Xeloda and risk for reduced efficacy of Xeloda while on omeprazole. Patient states that she only takes omeprazole PRN, she reports not taking any in the last week. We discussed alternatives to omeprazole while she is on Xeloda such as Pepcid or Pepcid complete to use PRN for heartburn/indigestion. She is willing to try these agents while on Xeloda.   Insurance authorization for Xeloda has been obtained. Patient will pick Xeloda up from the [facility].  Patient informed the pharmacy will reach out 5-7 days prior  to needing next fill of Xeloda to coordinate continued medication acquisition to prevent break in therapy.  All questions answered.  Jeanne Evans voiced understanding and appreciation.   Medication education handout placed in mail for patient. Patient knows to call the office with questions or concerns. Oral Chemotherapy Clinic phone number provided to patient.   Leron Croak, PharmD, BCPS Hematology/Oncology Clinical Pharmacist [facility] [phone] 01/21/2021 2:56 PM

## 2021-01-22 ENCOUNTER — Ambulatory Visit
Admission: RE | Admit: 2021-01-22 | Discharge: 2021-01-22 | Disposition: A | Discharge status: Home / Self Care | Payer: 59 | Source: Ambulatory Visit | Attending: Radiation Oncology | Admitting: Radiation Oncology

## 2021-01-22 ENCOUNTER — Other Ambulatory Visit: Payer: Self-pay

## 2021-01-22 DIAGNOSIS — C50412 Malignant neoplasm of upper-outer quadrant of left female breast: Secondary | ICD-10-CM | POA: Insufficient documentation

## 2021-01-22 DIAGNOSIS — Z170 Estrogen receptor positive status [ER+]: Secondary | ICD-10-CM | POA: Diagnosis present

## 2021-01-22 NOTE — Progress Notes (Signed)
Pt here for patient teaching.  Pt given Radiation and You booklet, skin care instructions, Alra deodorant and Radiaplex gel.  Reviewed areas of pertinence such as fatigue, hair loss, skin changes, breast tenderness and breast swelling . Pt able to give teach back of to pat skin and use unscented/gentle soap,apply Radiaplex bid, avoid applying anything to skin within 4 hours of treatment, avoid wearing an under wire bra and to use an electric razor if they must shave. Pt verbalizes understanding of information given and will contact nursing with any questions or concerns.     Kaylea Mounsey M. Kahmya Pinkham RN, BSN      

## 2021-01-23 ENCOUNTER — Ambulatory Visit
Admission: RE | Admit: 2021-01-23 | Discharge: 2021-01-23 | Disposition: A | Discharge status: Home / Self Care | Payer: 59 | Source: Ambulatory Visit | Attending: Radiation Oncology | Admitting: Radiation Oncology

## 2021-01-23 ENCOUNTER — Other Ambulatory Visit: Payer: Self-pay

## 2021-01-23 DIAGNOSIS — C50412 Malignant neoplasm of upper-outer quadrant of left female breast: Secondary | ICD-10-CM

## 2021-01-23 DIAGNOSIS — Z170 Estrogen receptor positive status [ER+]: Secondary | ICD-10-CM

## 2021-01-23 MED ORDER — ALRA NON-METALLIC DEODORANT (RAD-ONC)
1.0000 "application " | Freq: Once | TOPICAL | Status: AC
  Administered 2021-01-23: 1 via TOPICAL

## 2021-01-23 MED ORDER — RADIAPLEXRX EX GEL: Freq: Once | CUTANEOUS | Status: AC

## 2021-01-26 ENCOUNTER — Other Ambulatory Visit: Payer: Self-pay

## 2021-01-26 ENCOUNTER — Ambulatory Visit
Admission: RE | Admit: 2021-01-26 | Discharge: 2021-01-26 | Disposition: A | Discharge status: Home / Self Care | Payer: 59 | Source: Ambulatory Visit | Attending: Radiation Oncology | Admitting: Radiation Oncology

## 2021-01-26 DIAGNOSIS — C50412 Malignant neoplasm of upper-outer quadrant of left female breast: Secondary | ICD-10-CM | POA: Diagnosis not present

## 2021-01-27 ENCOUNTER — Inpatient Hospital Stay: Payer: 59

## 2021-01-27 ENCOUNTER — Ambulatory Visit
Admission: RE | Admit: 2021-01-27 | Discharge: 2021-01-27 | Disposition: A | Discharge status: Home / Self Care | Payer: 59 | Source: Ambulatory Visit | Attending: Radiation Oncology | Admitting: Radiation Oncology

## 2021-01-27 ENCOUNTER — Ambulatory Visit: Payer: 59

## 2021-01-27 DIAGNOSIS — Z23 Encounter for immunization: Secondary | ICD-10-CM

## 2021-01-27 DIAGNOSIS — M25612 Stiffness of left shoulder, not elsewhere classified: Secondary | ICD-10-CM

## 2021-01-27 DIAGNOSIS — R600 Localized edema: Secondary | ICD-10-CM

## 2021-01-27 DIAGNOSIS — R293 Abnormal posture: Secondary | ICD-10-CM

## 2021-01-27 DIAGNOSIS — C50412 Malignant neoplasm of upper-outer quadrant of left female breast: Secondary | ICD-10-CM

## 2021-01-27 DIAGNOSIS — Z170 Estrogen receptor positive status [ER+]: Secondary | ICD-10-CM

## 2021-01-27 DIAGNOSIS — Z483 Aftercare following surgery for neoplasm: Secondary | ICD-10-CM

## 2021-01-27 LAB — ESTRADIOL, ULTRA SENS: Estradiol, Sensitive: <2.5 pg/mL

## 2021-01-27 NOTE — Therapy (Signed)
[place], [place], [address] Phone: [phone]   Fax:  [phone]  Physical Therapy Treatment  Patient Details  Name: Jeanne Evans MRN: [id number] Date of Birth: [birth year] Referring Provider (PT): Dr. Erroll Luna   Encounter Date: 01/27/2021   PT End of Session - 01/27/21 1352    Visit Number 12    Number of Visits 22    Date for PT Re-Evaluation 02/26/21    PT Start Time 1306    PT Stop Time 1400    PT Time Calculation (min) 54 min    Activity Tolerance Patient tolerated treatment well    Behavior During Therapy [facility] for tasks assessed/performed           Past Medical History:  Diagnosis Date  . Breast cancer ([place])   . Family history of ovarian cancer   . Family history of prostate cancer   . GERD (gastroesophageal reflux disease)   . History of asthma    has albuterol, uses PRN  . History of heart murmur in childhood   . History of multiple allergies     Past Surgical History:  Procedure Laterality Date  . BREAST LUMPECTOMY WITH RADIOACTIVE SEED AND SENTINEL LYMPH NODE BIOPSY Left 11/19/2020   Procedure: LEFT BREAST LUMPECTOMY X 2 WITH RADIOACTIVE SEED AND SENTINEL LYMPH NODE MAPPING;  Surgeon: Erroll Luna, MD;  Location: [place];  Service: General;  Laterality: Left;  PECTORAL BLOCK  . PORTACATH PLACEMENT Right 06/18/2020   Procedure: INSERTION PORT-A-CATH WITH ULTRASOUND GUIDANCE;  Surgeon: Erroll Luna, MD;  Location: [place];  Service: General;  Laterality: Right;  . RE-EXCISION OF BREAST LUMPECTOMY Left 12/09/2020   Procedure: RE-EXCISION OF LEFT BREAST LUMPECTOMY;  Surgeon: Erroll Luna, MD;  Location: [place];  Service: General;  Laterality: Left;    There were no vitals filed for this visit.   Subjective Assessment - 01/27/21 1301    Subjective My arm has been hurting so bad in the upper arm and forearm  from the cording. Pain is an 8/10 right now.  It got worse last Thursday and throughout the weekend.  She has kept up with her stretches at home. Started radiation Thursday.  It seems to be going OK    Pertinent History Patient was diagnosed on 05/12/2020 with left breast cancer. She underwent neoadjuvant chemotherapy 06/19/2020-09/23/2020 and had a left lumpectomy and sentinel node biopsy (6 negative nodes) on 11/19/2020.  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    Patient Stated Goals Decrease pain and improve shoulder ROM    Currently in Pain? No/denies    Pain Score 8     Pain Location Arm    Pain Orientation Left    Pain Descriptors / Indicators Tightness;Throbbing    Pain Onset More than a month ago    Pain Frequency Intermittent    Multiple Pain Sites No                             [facility] Adult PT Treatment/Exercise - 01/27/21 0001      Shoulder Exercises: Supine   Other Supine Exercises AA shoulder flex and acaption with wand x 5 ea    Other Supine Exercises AROM  Bilateral flex, scaption, horizontal abd x 5 ea      Shoulder Exercises: Pulleys   Flexion 2 minutes    ABduction 2 minutes  Manual Therapy   Soft tissue mobilization to scar region of area of firmness, left medial forearm and upper arm with cocobutter    Myofascial Release left axillary region and upper arm and forearm    Passive ROM left shoulder flex, abd, IR and ER and D2 flex with VC's to relax                       PT Long Term Goals - 01/15/21 1048      PT LONG TERM GOAL #1   Title Patient will demonstrate she has regained full shoulder ROM and function post operatively compared to baselines.    Time 6    Period Weeks    Status On-going    Target Date 02/26/21      PT LONG TERM GOAL #2   Title Patient will increase left shoulder active flexion to >/= 135 degrees for increased ease reaching.    Baseline 109 post/  163 today    Time 6    Period Weeks     Status Achieved      PT LONG TERM GOAL #3   Title Patient will increase left shoulder active abduction to >/= 165 degrees for increased ease reaching.    Baseline 150 today    Time 6    Period Weeks    Status On-going    Target Date 02/26/21      PT LONG TERM GOAL #4   Title Patient will improve her DASH score to be </= 20 for improved overall upper extremity function.    Baseline 70.45 post op; 18.18 pre-op/  36% today    Time 6    Period Weeks    Status On-going    Target Date 02/26/21      PT LONG TERM GOAL #5   Title Patient will report >/= 50% decrease in pain for increased tolerance of daily tasks.    Baseline 45% better    Time 6    Period Weeks    Status On-going    Target Date 02/05/21                 Plan - 01/27/21 1354    Clinical Impression Statement Pt with increased arm pain in upper arm and forearm since last Thursday for unknown reasons.  Pt felt much better and was moving better after therapy. Cording still present in axilla and radiating down arm, but much more difficult to palpate in lower arm.  Pt. continues to feel more pain relief when wearing her sleeve.    Stability/Clinical Decision Making Stable/Uncomplicated    Rehab Potential Excellent    PT Frequency 2x / week    PT Duration 6 weeks    PT Treatment/Interventions ADLs/Self Care Home Management;Therapeutic exercise;Patient/family education;Passive range of motion;Manual lymph drainage;Manual techniques;Scar mobilization    PT Next Visit Plan Cont MFR to cording, PROM left shoulder, STM prn to lateral arm/pecs, started radiation , MLD and instruct pt in breast MLD    PT Home Exercise Plan Post op shoulder ROM HEP, desensitization techniques, compression bra and sleeve, supine scap. series.    Consulted and Agree with Plan of Care Patient           Patient will benefit from skilled therapeutic intervention in order to improve the following deficits and impairments:  Postural  dysfunction,Decreased range of motion,Impaired UE functional use,Pain,Decreased knowledge of precautions,Decreased scar mobility,Increased fascial restricitons,Increased edema,Impaired flexibility  Visit Diagnosis: Malignant neoplasm of upper-outer quadrant  of left breast in female, estrogen receptor positive ([place])  Abnormal posture  Aftercare following surgery for neoplasm  Stiffness of left shoulder, not elsewhere classified  Localized edema     Problem List Patient Active Problem List   Diagnosis Date Noted  . Drug-induced neutropenia ([place]) 09/30/2020  . Genetic testing 06/26/2020  . Family history of ovarian cancer   . Family history of prostate cancer   . Malignant neoplasm of upper-outer quadrant of left breast in female, estrogen receptor positive ([place]) 06/03/2020    Claris Pong 01/27/2021, 2:03 PM  [place], [place], [address] Phone: [phone]   Fax:  [phone]  Name: Jeanne Evans MRN: [id number] Date of Birth: 1986/02/13 Cheral Almas, PT 01/27/21 2:03 PM

## 2021-01-28 ENCOUNTER — Other Ambulatory Visit: Payer: Self-pay

## 2021-01-28 ENCOUNTER — Ambulatory Visit
Admission: RE | Admit: 2021-01-28 | Discharge: 2021-01-28 | Disposition: A | Discharge status: Home / Self Care | Payer: 59 | Source: Ambulatory Visit | Attending: Radiation Oncology | Admitting: Radiation Oncology

## 2021-01-28 DIAGNOSIS — C50412 Malignant neoplasm of upper-outer quadrant of left female breast: Secondary | ICD-10-CM | POA: Diagnosis not present

## 2021-01-29 ENCOUNTER — Ambulatory Visit
Admission: RE | Admit: 2021-01-29 | Discharge: 2021-01-29 | Disposition: A | Discharge status: Home / Self Care | Payer: 59 | Source: Ambulatory Visit | Attending: Radiation Oncology | Admitting: Radiation Oncology

## 2021-01-29 ENCOUNTER — Ambulatory Visit: Payer: 59

## 2021-01-29 DIAGNOSIS — Z170 Estrogen receptor positive status [ER+]: Secondary | ICD-10-CM

## 2021-01-29 DIAGNOSIS — Z483 Aftercare following surgery for neoplasm: Secondary | ICD-10-CM

## 2021-01-29 DIAGNOSIS — M25612 Stiffness of left shoulder, not elsewhere classified: Secondary | ICD-10-CM

## 2021-01-29 DIAGNOSIS — C50412 Malignant neoplasm of upper-outer quadrant of left female breast: Secondary | ICD-10-CM

## 2021-01-29 DIAGNOSIS — R293 Abnormal posture: Secondary | ICD-10-CM

## 2021-01-29 DIAGNOSIS — R600 Localized edema: Secondary | ICD-10-CM

## 2021-01-29 NOTE — Therapy (Signed)
[place], [place], [address] Phone: [phone]   Fax:  [phone]  Physical Therapy Treatment  Patient Details  Name: Jeanne Evans MRN: [id number] Date of Birth: 05/11/1986 Referring Provider (PT): Dr. Erroll Luna   Encounter Date: 01/29/2021   PT End of Session - 01/29/21 1050    Visit Number 13    Number of Visits 22    Date for PT Re-Evaluation 02/26/21    PT Start Time 1009    PT Stop Time 1100    PT Time Calculation (min) 51 min    Activity Tolerance Patient tolerated treatment well    Behavior During Therapy [facility] for tasks assessed/performed           Past Medical History:  Diagnosis Date  . Breast cancer ([place])   . Family history of ovarian cancer   . Family history of prostate cancer   . GERD (gastroesophageal reflux disease)   . History of asthma    has albuterol, uses PRN  . History of heart murmur in childhood   . History of multiple allergies     Past Surgical History:  Procedure Laterality Date  . BREAST LUMPECTOMY WITH RADIOACTIVE SEED AND SENTINEL LYMPH NODE BIOPSY Left 11/19/2020   Procedure: LEFT BREAST LUMPECTOMY X 2 WITH RADIOACTIVE SEED AND SENTINEL LYMPH NODE MAPPING;  Surgeon: Erroll Luna, MD;  Location: [place];  Service: General;  Laterality: Left;  PECTORAL BLOCK  . PORTACATH PLACEMENT Right 06/18/2020   Procedure: INSERTION PORT-A-CATH WITH ULTRASOUND GUIDANCE;  Surgeon: Erroll Luna, MD;  Location: [place];  Service: General;  Laterality: Right;  . RE-EXCISION OF BREAST LUMPECTOMY Left 12/09/2020   Procedure: RE-EXCISION OF LEFT BREAST LUMPECTOMY;  Surgeon: Erroll Luna, MD;  Location: [place];  Service: General;  Laterality: Left;    There were no vitals filed for this visit.   Subjective Assessment - 01/29/21 1009    Subjective 9 minutes late.  The hospital was running late and I am just  coming from radiation.  Pain is a little better 6-7/10. Really tender just above and at elbow  Felt much better after I left last time.    Pertinent History Patient was diagnosed on 05/12/2020 with left breast cancer. She underwent neoadjuvant chemotherapy 06/19/2020-09/23/2020 and had a left lumpectomy and sentinel node biopsy (6 negative nodes) on 11/19/2020.  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    Patient Stated Goals Decrease pain and improve shoulder ROM    Currently in Pain? Yes    Pain Score 7     Pain Location Arm    Pain Orientation Left    Pain Descriptors / Indicators Tightness    Pain Type Acute pain    Pain Onset More than a month ago    Pain Frequency Intermittent    Multiple Pain Sites No                             [facility] Adult PT Treatment/Exercise - 01/29/21 0001      Shoulder Exercises: Supine   Other Supine Exercises AROM  Bilateral flex, scaption, horizontal abd x 5 ea      Shoulder Exercises: Pulleys   Flexion 2 minutes    ABduction 2 minutes      Shoulder Exercises: Therapy Ball   Flexion 10 reps   on ball     Manual  Therapy   Soft tissue mobilization to scar region of area of firmness, left medial forearm and upper arm with cocobutter    Myofascial Release left axillary region and upper arm and  especially to large cord near elbow    Passive ROM left shoulder flex, abd, IR and ER and D2 flex with VC's to relax                       PT Long Term Goals - 01/15/21 1048      PT LONG TERM GOAL #1   Title Patient will demonstrate she has regained full shoulder ROM and function post operatively compared to baselines.    Time 6    Period Weeks    Status On-going    Target Date 02/26/21      PT LONG TERM GOAL #2   Title Patient will increase left shoulder active flexion to >/= 135 degrees for increased ease reaching.    Baseline 109 post/  163 today    Time 6    Period Weeks    Status Achieved      PT LONG  TERM GOAL #3   Title Patient will increase left shoulder active abduction to >/= 165 degrees for increased ease reaching.    Baseline 150 today    Time 6    Period Weeks    Status On-going    Target Date 02/26/21      PT LONG TERM GOAL #4   Title Patient will improve her DASH score to be </= 20 for improved overall upper extremity function.    Baseline 70.45 post op; 18.18 pre-op/  36% today    Time 6    Period Weeks    Status On-going    Target Date 02/26/21      PT LONG TERM GOAL #5   Title Patient will report >/= 50% decrease in pain for increased tolerance of daily tasks.    Baseline 45% better    Time 6    Period Weeks    Status On-going    Target Date 02/05/21                 Plan - 01/29/21 1051    Clinical Impression Statement Pts felt much better after last treatment however, she continues to have pain and limitation from multiple cords and  with large cord more palpable today at medial elbow and foream.  MFR techniques help ease the pain.  She continues to be compliant with HEP and discomfort always improves with manual work    Stability/Clinical Decision Making Stable/Uncomplicated    Rehab Potential Excellent    PT Frequency 2x / week    PT Duration 6 weeks    PT Treatment/Interventions ADLs/Self Care Home Management;Therapeutic exercise;Patient/family education;Passive range of motion;Manual lymph drainage;Manual techniques;Scar mobilization    PT Next Visit Plan Cont MFR to cording especially large cord at medial elbow, PROM left shoulder, STM prn to med,lateral arm/pecs, started radiation , MLD and instruct pt in breast MLD    PT Home Exercise Plan Post op shoulder ROM HEP, desensitization techniques, compression bra and sleeve, supine scap. series.    Consulted and Agree with Plan of Care Patient           Patient will benefit from skilled therapeutic intervention in order to improve the following deficits and impairments:  Postural  dysfunction,Decreased range of motion,Impaired UE functional use,Pain,Decreased knowledge of precautions,Decreased scar mobility,Increased fascial restricitons,Increased edema,Impaired flexibility  Visit Diagnosis: Stiffness of left shoulder, not elsewhere classified  Aftercare following surgery for neoplasm  Abnormal posture  Malignant neoplasm of upper-outer quadrant of left breast in female, estrogen receptor positive ([place] [address] [place])  Localized edema     Problem List Patient Active Problem List   Diagnosis Date Noted  . Drug-induced neutropenia ([place]) 09/30/2020  . Genetic testing 06/26/2020  . Family history of ovarian cancer   . Family history of prostate cancer   . Malignant neoplasm of upper-outer quadrant of left breast in female, estrogen receptor positive ([place]) 06/03/2020    Claris Pong 01/29/2021, 12:07 PM  [place], [place], [address] Phone: [phone]   Fax:  [phone]  Name: AUDRINA MARTEN MRN: [id number] Date of Birth: 1985/11/21 Cheral Almas, PT 01/29/21 12:08 PM

## 2021-01-30 ENCOUNTER — Ambulatory Visit
Admission: RE | Admit: 2021-01-30 | Discharge: 2021-01-30 | Disposition: A | Discharge status: Home / Self Care | Payer: 59 | Source: Ambulatory Visit | Attending: Radiation Oncology | Admitting: Radiation Oncology

## 2021-01-30 DIAGNOSIS — C50412 Malignant neoplasm of upper-outer quadrant of left female breast: Secondary | ICD-10-CM | POA: Diagnosis not present

## 2021-01-31 NOTE — Progress Notes (Signed)
[place]  [phone] [phone]     ID: Jeanne Evans DOB: [birth year]  MR#: [id number]  QPR#:[id number]  Patient Care Team: [name], MD as PCP - General (Obstetrics and Gynecology) Rockwell Germany, RN as Oncology Nurse Navigator Mauro Kaufmann, RN as Oncology Nurse Navigator Erroll Luna, MD as Consulting Physician (General Surgery) Jeana Kersting, Virgie Dad, MD as Consulting Physician (Oncology) Kyung Rudd, MD as Consulting Physician (Radiation Oncology) Servando Salina, MD as Consulting Physician (Obstetrics and Gynecology) Chauncey Cruel, MD OTHER MD:  CHIEF COMPLAINT: 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  She is accompanied by her close friend  She began capecitabine on on 01/26/2021 and concurrent radiation therapy on 01/22/2021. Her final radiation treatment is scheduled for 03/09/2021.  She is very concerned that her Social Security has not yet gone through because there is some hang up with the "third-party vendor".   REVIEW OF SYSTEMS: Adrinne is tolerating the capecitabine well.  She has a loose bowel movement about 3 times a week, not quite diarrhea, not requiring her to take Imodium or Questran or any other medication.  She is hydrating herself well.  She has also changed her diet.  She is eating more vegetables and drinking more water and this also may affect her bowel habits.  She washed her compression sleeve and it now does not fit.  She is working on physical therapy regarding that.  She needs to get back to work by April 12 or she will lose her job she says.  Aside from these issues a detailed review of systems today was stable   COVID 19 VACCINATION STATUS: [place] x2, most recently 01/2021   HISTORY OF CURRENT ILLNESS: From  the original intake note:  Jeanne Evans herself palpated a painful left breast lump. She underwent bilateral diagnostic mammography with tomography and left breast ultrasonography at [facility] on 05/12/2020 showing: breast density category B; palpable 2.7 cm oval hypoechoic mass in left breast at 2-3 o'clock; 2.2 cm left axillary lymph node/grouping of nodes.  Accordingly on 05/29/2020 she proceeded to biopsy of the left breast area in question. The pathology from this procedure ([id number]) showed: invasive ductal carcinoma, grade 3. Prognostic indicators significant for: estrogen receptor, 10% positive with weak staining intensity and progesterone receptor, 0% negative. Proliferation marker Ki67 at 95%. HER2 equivocal by immunohistochemistry (2+), but negative by fluorescent in situ hybridization with a signals ratio 1.77 and number per cell 2.65.  Biopsy of the left axillary lymph node in question was negative and concordant  The patient's subsequent history is as detailed below.   PAST MEDICAL HISTORY: Past Medical History:  Diagnosis Date   Breast cancer ([place])    Family history of ovarian cancer    Family history of prostate cancer    GERD (gastroesophageal reflux disease)    History of asthma    has albuterol, uses PRN   History of heart murmur in childhood    History of multiple allergies   History of allergy related headaches   PAST SURGICAL HISTORY: Past Surgical History:  Procedure Laterality Date   BREAST LUMPECTOMY WITH RADIOACTIVE SEED AND SENTINEL LYMPH NODE BIOPSY Left 11/19/2020   Procedure: LEFT BREAST LUMPECTOMY X 2 WITH RADIOACTIVE SEED AND SENTINEL LYMPH NODE MAPPING;  Surgeon: Erroll Luna, MD;  Location: [place];  Service:  General;  Laterality: Left;  PECTORAL BLOCK   PORTACATH PLACEMENT Right 06/18/2020   Procedure: INSERTION PORT-A-CATH WITH ULTRASOUND GUIDANCE;  Surgeon: Erroll Luna, MD;  Location: [place];  Service: General;  Laterality: Right;   RE-EXCISION OF BREAST LUMPECTOMY Left 12/09/2020   Procedure: RE-EXCISION OF LEFT BREAST LUMPECTOMY;  Surgeon: Erroll Luna, MD;  Location: [place];  Service: General;  Laterality: Left;  She reports having a boil lanced from her perineal area   FAMILY HISTORY: Family History  Problem Relation Age of Onset   Lung cancer Maternal Uncle        dx late 37s   Prostate cancer Paternal Uncle        dx late 37s   Ovarian cancer Paternal Grandmother        dx 37s   Prostate cancer Paternal Grandfather        dx 37s   Prostate cancer Paternal Uncle        dx early 37s  The patient's father is 37 and her mother 37, as of 05/2020.  The patient has two brothers and one sister. She reports ovarian cancer in her paternal grandmother, prostate cancer in her paternal grandfather and a paternal uncle, and lung cancer in a maternal uncle.   GYNECOLOGIC HISTORY:  No LMP recorded. (Menstrual status: IUD). Menarche: 35 years old Age at first live birth: 35 years old [place] P 2 LMP current, experiences spotting Contraceptive: Mirena IUD in place HRT n/a  Hysterectomy? no BSO? 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 She describes herself as single. She lives at home with her children, Jeanne Evans (age 37) and Jeanne Evans (age 37).  She is originally from [place] her home.    ADVANCED DIRECTIVES: Not in place. She intends to name her mother, [name], as her HCPOA.  [name] lives in [place] and can be reached at [phone].  The patient was given the appropriate documents to complete and notarized at her discretion at the time of her visit 06/04/2020   HEALTH MAINTENANCE: Social History   Tobacco Use   Smoking status: Never Smoker   Smokeless tobacco: Never Used  Substance Use  Topics   Alcohol use: Yes    Comment: occas   Drug use: Yes    Types: Marijuana     Colonoscopy: n/a (age)  PAP: 04/2020  Bone density: n/a (age)   Allergies  Allergen Reactions   Sulfa Antibiotics Hives    Current Outpatient Medications  Medication Sig Dispense Refill   Acetaminophen (TYLENOL) 325 MG CAPS Take by mouth.     capecitabine (XELODA) 500 MG tablet Take 2 tablets (1,000 mg total) by mouth 2 (two) times daily after a meal. Take only on radiation days. Start Monday 01/26/2021 84 tablet 0   gabapentin (NEURONTIN) 300 MG capsule Take 1 capsule (300 mg total) by mouth at bedtime. 90 capsule 4   ibuprofen (ADVIL) 800 MG tablet Take 1 tablet (800 mg total) by mouth every 8 (eight) hours as needed. 30 tablet 0   levonorgestrel (MIRENA) 20 MCG/24HR IUD 1 each by Intrauterine route once.     magic mouthwash SOLN Take 5 mLs by mouth 4 (four) times daily as needed for mouth pain. 240 mL 1   naproxen sodium (ALEVE) 220 MG tablet Take 220 mg by mouth.     omeprazole (PRILOSEC) 40 MG capsule Take  1 capsule (40 mg total) by mouth at bedtime. 30 capsule 3   venlafaxine (EFFEXOR) 75 MG tablet Take 1 tablet (75 mg total) by mouth 1 day or 1 dose for 1 dose. 90 tablet 4   No current facility-administered medications for this visit.    OBJECTIVE: African-American woman in no acute distress Vitals:   02/02/21 0954  BP: 132/77  Pulse: 82  Resp: 20  Temp: 97.9 F (36.6 C)  SpO2: 100%     Body mass index is 32.74 kg/m.   Wt Readings from Last 3 Encounters:  02/02/21 184 lb 12.8 oz (83.8 kg)  01/19/21 183 lb 6.4 oz (83.2 kg)  01/06/21 182 lb 12.8 oz (82.9 kg)  ECOG FS:1 - Symptomatic but completely ambulatory  Hair is growing back with no bald spots Sclerae unicteric, EOMs intact Wearing a mask No cervical or supraclavicular adenopathy Lungs no rales or rhonchi Heart regular rate and rhythm Abd soft, nontender, positive bowel sounds MSK no focal spinal tenderness,  no upper extremity lymphedema Neuro: nonfocal, well oriented, appropriate affect Breasts: The right breast is unremarkable.  The left breast is status post lumpectomy and is currently receiving radiation.  There is minimal edema and hyperpigmentation.  There is no desquamation.  The cosmetic result is excellent.  Both axillae are benign.   LAB RESULTS:  CMP     Component Value Date/Time   NA 141 01/19/2021 1025   K 3.9 01/19/2021 1025   CL 107 01/19/2021 1025   CO2 26 01/19/2021 1025   GLUCOSE 99 01/19/2021 1025   BUN 11 01/19/2021 1025   CREATININE 0.81 01/19/2021 1025   CREATININE 0.92 06/04/2020 0830   CALCIUM 9.0 01/19/2021 1025   PROT 7.2 01/19/2021 1025   ALBUMIN 4.0 01/19/2021 1025   AST 15 01/19/2021 1025   AST 13 (L) 06/04/2020 0830   ALT 17 01/19/2021 1025   ALT 14 06/04/2020 0830   ALKPHOS 71 01/19/2021 1025   BILITOT 0.5 01/19/2021 1025   BILITOT 0.5 06/04/2020 0830   GFRNONAA >60 01/19/2021 1025   GFRNONAA >60 06/04/2020 0830   GFRAA >60 08/05/2020 0955   GFRAA >60 06/04/2020 0830    No results found for: TOTALPROTELP, ALBUMINELP, A1GS, A2GS, BETS, BETA2SER, GAMS, MSPIKE, SPEI  Lab Results  Component Value Date   WBC 3.1 (L) 01/19/2021   NEUTROABS 1.6 (L) 01/19/2021   HGB 11.9 (L) 01/19/2021   HCT 36.2 01/19/2021   MCV 85.6 01/19/2021   PLT 233 01/19/2021    No results found for: LABCA2  No components found for: WCHENI778  No results for input(s): INR in the last 168 hours.  No results found for: LABCA2  No results found for: EUM353  No results found for: IRW431  No results found for: VQM086  No results found for: CA2729  No components found for: HGQUANT  No results found for: CEA1 / No results found for: CEA1   No results found for: AFPTUMOR  No results found for: CHROMOGRNA  No results found for: KPAFRELGTCHN, LAMBDASER, KAPLAMBRATIO (kappa/lambda light chains)  No results found for: HGBA, HGBA2QUANT, HGBFQUANT,  HGBSQUAN (Hemoglobinopathy evaluation)   No results found for: LDH  No results found for: IRON, TIBC, IRONPCTSAT (Iron and TIBC)  No results found for: FERRITIN  Urinalysis No results found for: COLORURINE, APPEARANCEUR, LABSPEC, PHURINE, GLUCOSEU, HGBUR, BILIRUBINUR, KETONESUR, PROTEINUR, UROBILINOGEN, NITRITE, LEUKOCYTESUR   STUDIES: No results found.   ELIGIBLE FOR AVAILABLE RESEARCH PROTOCOL: no  ASSESSMENT: 35 y.o. [place] woman status post  left breast upper outer quadrant biopsy 05/29/2020 for a clinical T2 N0, stage IIB invasive ductal carcinoma, functionally triple negative, with an MIB-1 of 95%.  (1) genetics testing 06/23/2020 through the [facility] Multi-Cancer Panel found no deleterious mutations in AIP, ALK, APC, ATM, AXIN2,BAP1,  BARD1, BLM, BMPR1A, BRCA1, BRCA2, BRIP1, CASR, CDC73, CDH1, CDK4, CDKN1B, CDKN1C, CDKN2A (p14ARF), CDKN2A (p16INK4a), CEBPA, CHEK2, CTNNA1, DICER1, DIS3L2, EGFR (c.2369C>T, p.Thr790Met variant only), EPCAM (Deletion/duplication testing only), FH, FLCN, GATA2, GPC3, GREM1 (Promoter region deletion/duplication testing only), HOXB13 (c.251G>A, p.Gly84Glu), HRAS, KIT, MAX, MEN1, MET, MITF (c.952G>A, p.Glu318Lys variant only), MLH1, MSH2, MSH3, MSH6, MUTYH, NBN, NF1, NF2, NTHL1, PALB2, PDGFRA, PHOX2B, PMS2, POLD1, POLE, POT1, PRKAR1A, PTCH1, PTEN, RAD50, RAD51C, RAD51D, RB1, RECQL4, RET, RNF43, RUNX1, SDHAF2, SDHA (sequence changes only), SDHB, SDHC, SDHD, SMAD4, SMARCA4, SMARCB1, SMARCE1, STK11, SUFU, TERC, TERT, TMEM127, TP53, TSC1, TSC2, VHL, WRN and WT1.   (2) neoadjuvant chemotherapy consisting of cyclophosphamide and doxorubicin in dose dense fashion x4 started 06/19/2020, completed 08/05/2020, followed by paclitaxel and carboplatin weekly x12 starting 08/26/2020, completing 4 doses 09/23/2020  (a) echocardiogram on 06/13/2020 showed an EF of 60-65%  (b) paclitaxel and carboplatin discontinued after 4 doses because of neuropathy and cytopenias  (3)  status post left lumpectomy and sentinel lymph node sampling 11/19/2020 for a residual yp T1c ypN0 invasive ductal carcinoma involving the superior margin  (a) additional surgery 12/09/2020 cleared the margin in question  (4) postmastectomy radiation started 01/22/2021:   (a) receiving capecitabine sensitization, started 01/26/2021  (5) to start pembrolizumab/Keytruda after the completion of radiation   PLAN: Geneieve is doing well with her radiation so far and she is also tolerating the sensitizing capecitabine well.  We discussed the loose bowel movements she is having which may be due to diet or may be due to the medication.  If they worsen she will let us know in the meantime she is doing a good job of keeping herself hydrated.  She is very concerned about her Social Security issue.  I think we have done everything we need to do to move that for her but I will check with our staff and medical records to make sure.  She will complete her radiation treatments 03/09/2021.  I will see her that same day and at that time we will change to full dose capecitabine to complete a total of 6 months of treatment  She knows to call for any other issue that may develop before the next visit.  Total encounter time 25 minutes.Sarajane Jews C. Eliyohu Class, MD 02/02/21 10:10 AM Medical Oncology and Hematology [facility] [place], [place] [address] Tel. [phone]    Fax. [phone]   I, Wilburn Mylar, am acting as scribe for Dr. Virgie Dad. Ke[place] Pulse.  I, Lurline Del MD, have reviewed the above documentation for accuracy and completeness, and I agree with the above.   *Total Encounter Time as defined by the Centers for Medicare and Medicaid Services includes, in addition to the face-to-face time of a patient visit (documented in the note above) non-face-to-face time: obtaining and reviewing outside history, ordering and reviewing medications, tests or procedures, care  coordination (communications with other health care professionals or caregivers) and documentation in the medical record.

## 2021-02-02 ENCOUNTER — Inpatient Hospital Stay (HOSPITAL_BASED_OUTPATIENT_CLINIC_OR_DEPARTMENT_OTHER): Payer: 59 | Admitting: Oncology

## 2021-02-02 ENCOUNTER — Ambulatory Visit
Admission: RE | Admit: 2021-02-02 | Discharge: 2021-02-02 | Disposition: A | Discharge status: Home / Self Care | Payer: 59 | Source: Ambulatory Visit | Attending: Radiation Oncology | Admitting: Radiation Oncology

## 2021-02-02 ENCOUNTER — Other Ambulatory Visit: Payer: Self-pay

## 2021-02-02 VITALS — BP 132/77 | HR 82 | Temp 97.9°F | Resp 20 | Ht 63.0 in | Wt 184.8 lb

## 2021-02-02 DIAGNOSIS — C50412 Malignant neoplasm of upper-outer quadrant of left female breast: Secondary | ICD-10-CM

## 2021-02-02 DIAGNOSIS — I890 Lymphedema, not elsewhere classified: Secondary | ICD-10-CM | POA: Diagnosis not present

## 2021-02-02 DIAGNOSIS — Z170 Estrogen receptor positive status [ER+]: Secondary | ICD-10-CM

## 2021-02-03 ENCOUNTER — Ambulatory Visit: Payer: 59

## 2021-02-03 ENCOUNTER — Telehealth: Payer: Self-pay | Admitting: Oncology

## 2021-02-03 DIAGNOSIS — M25612 Stiffness of left shoulder, not elsewhere classified: Secondary | ICD-10-CM

## 2021-02-03 DIAGNOSIS — R293 Abnormal posture: Secondary | ICD-10-CM

## 2021-02-03 DIAGNOSIS — Z483 Aftercare following surgery for neoplasm: Secondary | ICD-10-CM

## 2021-02-03 DIAGNOSIS — Z170 Estrogen receptor positive status [ER+]: Secondary | ICD-10-CM

## 2021-02-03 DIAGNOSIS — C50412 Malignant neoplasm of upper-outer quadrant of left female breast: Secondary | ICD-10-CM

## 2021-02-03 DIAGNOSIS — R600 Localized edema: Secondary | ICD-10-CM

## 2021-02-03 NOTE — Therapy (Signed)
[place], [place], [address] Phone: [phone]   Fax:  [phone]  Physical Therapy Treatment  Patient Details  Name: Jeanne Evans MRN: [id number] Date of Birth: [birth year] Referring Provider (PT): Dr. Erroll Luna   Encounter Date: 02/03/2021   PT End of Session - 02/03/21 1342    Visit Number 14    Number of Visits 22    Date for PT Re-Evaluation 02/26/21    PT Start Time 7622    PT Stop Time 1354    PT Time Calculation (min) 51 min    Activity Tolerance Patient tolerated treatment well    Behavior During Therapy [facility] for tasks assessed/performed           Past Medical History:  Diagnosis Date  . Breast cancer ([place])   . Family history of ovarian cancer   . Family history of prostate cancer   . GERD (gastroesophageal reflux disease)   . History of asthma    has albuterol, uses PRN  . History of heart murmur in childhood   . History of multiple allergies     Past Surgical History:  Procedure Laterality Date  . BREAST LUMPECTOMY WITH RADIOACTIVE SEED AND SENTINEL LYMPH NODE BIOPSY Left 11/19/2020   Procedure: LEFT BREAST LUMPECTOMY X 2 WITH RADIOACTIVE SEED AND SENTINEL LYMPH NODE MAPPING;  Surgeon: Erroll Luna, MD;  Location: [place];  Service: General;  Laterality: Left;  PECTORAL BLOCK  . PORTACATH PLACEMENT Right 06/18/2020   Procedure: INSERTION PORT-A-CATH WITH ULTRASOUND GUIDANCE;  Surgeon: Erroll Luna, MD;  Location: [place];  Service: General;  Laterality: Right;  . RE-EXCISION OF BREAST LUMPECTOMY Left 12/09/2020   Procedure: RE-EXCISION OF LEFT BREAST LUMPECTOMY;  Surgeon: Erroll Luna, MD;  Location: [place];  Service: General;  Laterality: Left;    There were no vitals filed for this visit.   Subjective Assessment - 02/03/21 1304    Subjective Pt notes that after washing her sleeve 2 days later it started  to slide down. I have a lump in my medial upper arm.  It seems a little smaller today.  My arm was doing better for a while and now it is more sensitive again. My daughter has been trying to help me with the stretches.    Pertinent History Patient was diagnosed on 05/12/2020 with left breast cancer. She underwent neoadjuvant chemotherapy 06/19/2020-09/23/2020 and had a left lumpectomy and sentinel node biopsy (6 negative nodes) on 11/19/2020.  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    Patient Stated Goals Decrease pain and improve shoulder ROM    Currently in Pain? Yes    Pain Score 6     Pain Location Arm    Pain Orientation Left    Pain Descriptors / Indicators Tightness;Aching    Pain Type Acute pain                             [facility] Adult PT Treatment/Exercise - 02/03/21 0001      Shoulder Exercises: Supine   Other Supine Exercises AROM  Bilateral flex, scaption, horizontal abd x 5 ea      Shoulder Exercises: Pulleys   Flexion 2 minutes    Scaption 1 minute    ABduction 2 minutes      Manual Therapy   Soft tissue mobilization to scar region of area of firmness, left medial  forearm and upper arm with cocobutter    Myofascial Release left axillary region and upper arm and  especially to large cord near elbow    Passive ROM left shoulder flex, abd, IR and ER and D2 flex with VC's to relax                       PT Long Term Goals - 01/15/21 1048      PT LONG TERM GOAL #1   Title Patient will demonstrate she has regained full shoulder ROM and function post operatively compared to baselines.    Time 6    Period Weeks    Status On-going    Target Date 02/26/21      PT LONG TERM GOAL #2   Title Patient will increase left shoulder active flexion to >/= 135 degrees for increased ease reaching.    Baseline 109 post/  163 today    Time 6    Period Weeks    Status Achieved      PT LONG TERM GOAL #3   Title Patient will increase left  shoulder active abduction to >/= 165 degrees for increased ease reaching.    Baseline 150 today    Time 6    Period Weeks    Status On-going    Target Date 02/26/21      PT LONG TERM GOAL #4   Title Patient will improve her DASH score to be </= 20 for improved overall upper extremity function.    Baseline 70.45 post op; 18.18 pre-op/  36% today    Time 6    Period Weeks    Status On-going    Target Date 02/26/21      PT LONG TERM GOAL #5   Title Patient will report >/= 50% decrease in pain for increased tolerance of daily tasks.    Baseline 45% better    Time 6    Period Weeks    Status On-going    Target Date 02/05/21                 Plan - 02/03/21 1344    Clinical Impression Statement Left medial upper arm with small nodular area that is tender, and left breast continues with large firm nodular area in incision.  cording not as prominent today but still tender.  Left breast appears slightly swollen and would benefit from MLD next visit    Stability/Clinical Decision Making Stable/Uncomplicated    Rehab Potential Excellent    PT Frequency 2x / week    PT Duration 6 weeks    PT Treatment/Interventions ADLs/Self Care Home Management;Therapeutic exercise;Patient/family education;Passive range of motion;Manual lymph drainage;Manual techniques;Scar mobilization    PT Next Visit Plan MLD left breast and instruct pt,Cont MFR to cording especially large cord at medial elbow, PROM left shoulder, STM prn to med,lateral arm/pecs, started radiation ,    PT Home Exercise Plan Post op shoulder ROM HEP, desensitization techniques, compression bra and sleeve, supine scap. series.    Consulted and Agree with Plan of Care Patient           Patient will benefit from skilled therapeutic intervention in order to improve the following deficits and impairments:  Postural dysfunction,Decreased range of motion,Impaired UE functional use,Pain,Decreased knowledge of precautions,Decreased scar  mobility,Increased fascial restricitons,Increased edema,Impaired flexibility  Visit Diagnosis: Stiffness of left shoulder, not elsewhere classified  Aftercare following surgery for neoplasm  Abnormal posture  Malignant neoplasm of upper-outer quadrant of left  breast in female, estrogen receptor positive ([place])  Localized edema     Problem List Patient Active Problem List   Diagnosis Date Noted  . Lymphedema of left arm 02/02/2021  . Drug-induced neutropenia ([place]) 09/30/2020  . Genetic testing 06/26/2020  . Family history of ovarian cancer   . Family history of prostate cancer   . Malignant neoplasm of upper-outer quadrant of left breast in female, estrogen receptor positive ([place]) 06/03/2020    Claris Pong 02/03/2021, 1:54 PM  [place], [place], [address] Phone: [phone]   Fax:  [phone]  Name: MALIE KASHANI MRN: [id number] Date of Birth: 03/18/1986  Cheral Almas, PT 02/03/21 1:58 PM

## 2021-02-03 NOTE — Telephone Encounter (Signed)
Scheduled appts per 3/21 los. Pt aware.

## 2021-02-04 ENCOUNTER — Other Ambulatory Visit: Payer: Self-pay

## 2021-02-04 ENCOUNTER — Ambulatory Visit
Admission: RE | Admit: 2021-02-04 | Discharge: 2021-02-04 | Disposition: A | Discharge status: Home / Self Care | Payer: 59 | Source: Ambulatory Visit | Attending: Radiation Oncology | Admitting: Radiation Oncology

## 2021-02-04 DIAGNOSIS — C50412 Malignant neoplasm of upper-outer quadrant of left female breast: Secondary | ICD-10-CM | POA: Diagnosis not present

## 2021-02-05 ENCOUNTER — Ambulatory Visit: Payer: 59

## 2021-02-05 ENCOUNTER — Ambulatory Visit
Admission: RE | Admit: 2021-02-05 | Discharge: 2021-02-05 | Disposition: A | Discharge status: Home / Self Care | Payer: 59 | Source: Ambulatory Visit | Attending: Radiation Oncology | Admitting: Radiation Oncology

## 2021-02-05 DIAGNOSIS — C50412 Malignant neoplasm of upper-outer quadrant of left female breast: Secondary | ICD-10-CM | POA: Diagnosis not present

## 2021-02-05 DIAGNOSIS — Z170 Estrogen receptor positive status [ER+]: Secondary | ICD-10-CM

## 2021-02-05 DIAGNOSIS — R600 Localized edema: Secondary | ICD-10-CM

## 2021-02-05 DIAGNOSIS — M25612 Stiffness of left shoulder, not elsewhere classified: Secondary | ICD-10-CM

## 2021-02-05 DIAGNOSIS — R293 Abnormal posture: Secondary | ICD-10-CM

## 2021-02-05 DIAGNOSIS — Z483 Aftercare following surgery for neoplasm: Secondary | ICD-10-CM

## 2021-02-05 NOTE — Therapy (Signed)
[place], [place], [address] Phone: [phone]   Fax:  [phone]  Physical Therapy Treatment  Patient Details  Name: Jeanne Evans MRN: [id number] Date of Birth: Nov 17, 1985 Referring Provider (PT): Dr. Erroll Luna   Encounter Date: 02/05/2021   PT End of Session - 02/05/21 1156    Visit Number 15    Number of Visits 22    Date for PT Re-Evaluation 02/26/21    PT Start Time 1107    PT Stop Time 1202    PT Time Calculation (min) 55 min    Activity Tolerance Patient tolerated treatment well    Behavior During Therapy [facility] for tasks assessed/performed           Past Medical History:  Diagnosis Date  . Breast cancer ([place])   . Family history of ovarian cancer   . Family history of prostate cancer   . GERD (gastroesophageal reflux disease)   . History of asthma    has albuterol, uses PRN  . History of heart murmur in childhood   . History of multiple allergies     Past Surgical History:  Procedure Laterality Date  . BREAST LUMPECTOMY WITH RADIOACTIVE SEED AND SENTINEL LYMPH NODE BIOPSY Left 11/19/2020   Procedure: LEFT BREAST LUMPECTOMY X 2 WITH RADIOACTIVE SEED AND SENTINEL LYMPH NODE MAPPING;  Surgeon: Erroll Luna, MD;  Location: [place];  Service: General;  Laterality: Left;  PECTORAL BLOCK  . PORTACATH PLACEMENT Right 06/18/2020   Procedure: INSERTION PORT-A-CATH WITH ULTRASOUND GUIDANCE;  Surgeon: Erroll Luna, MD;  Location: [place];  Service: General;  Laterality: Right;  . RE-EXCISION OF BREAST LUMPECTOMY Left 12/09/2020   Procedure: RE-EXCISION OF LEFT BREAST LUMPECTOMY;  Surgeon: Erroll Luna, MD;  Location: [place];  Service: General;  Laterality: Left;    There were no vitals filed for this visit.   Subjective Assessment - 02/05/21 1108    Subjective My  arm felt better after I left.  It still hurt but I used  Vics and it felt better. Have been working on the exercises at home.  Just coming from radiation.    Pertinent History Patient was diagnosed on 05/12/2020 with left breast cancer. She underwent neoadjuvant chemotherapy 06/19/2020-09/23/2020 and had a left lumpectomy and sentinel node biopsy (6 negative nodes) on 11/19/2020.  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    Patient Stated Goals Decrease pain and improve shoulder ROM    Currently in Pain? No/denies    Pain Score 0-No pain                             [facility] Adult PT Treatment/Exercise - 02/05/21 0001      Shoulder Exercises: Pulleys   Flexion 2 minutes    ABduction 2 minutes      Shoulder Exercises: Therapy Ball   Flexion 5 reps    ABduction 5 reps      Manual Therapy   Soft tissue mobilization to scar region of area of firmness, left  pectoralis and lats,with cocobutter    Myofascial Release left axillary region and upper arm and  especially to large cord near elbow    Manual Lymphatic Drainage (MLD) supraclavicular, right axillary LN, left inguinal LN and interaxillary pathway and axillo inguinal pathway, and left breast retracing all pathways and ending with LN's    Passive ROM left shoulder  flex, abd, IR and ER and D2 flex with VC's to relax                       PT Long Term Goals - 01/15/21 1048      PT LONG TERM GOAL #1   Title Patient will demonstrate she has regained full shoulder ROM and function post operatively compared to baselines.    Time 6    Period Weeks    Status On-going    Target Date 02/26/21      PT LONG TERM GOAL #2   Title Patient will increase left shoulder active flexion to >/= 135 degrees for increased ease reaching.    Baseline 109 post/  163 today    Time 6    Period Weeks    Status Achieved      PT LONG TERM GOAL #3   Title Patient will increase left shoulder active abduction to >/= 165 degrees for increased ease reaching.    Baseline 150 today     Time 6    Period Weeks    Status On-going    Target Date 02/26/21      PT LONG TERM GOAL #4   Title Patient will improve her DASH score to be </= 20 for improved overall upper extremity function.    Baseline 70.45 post op; 18.18 pre-op/  36% today    Time 6    Period Weeks    Status On-going    Target Date 02/26/21      PT LONG TERM GOAL #5   Title Patient will report >/= 50% decrease in pain for increased tolerance of daily tasks.    Baseline 45% better    Time 6    Period Weeks    Status On-going    Target Date 02/05/21                 Plan - 02/05/21 1157    Clinical Impression Statement Left medial arm continues with small nodular area that is not as tender and large nodule in breast incision area is still present. Slight edema noted at lateral breast and MLD to breast was initiated today. Cording does not seem as prominent today and pts. PROM is very good today.    Stability/Clinical Decision Making Stable/Uncomplicated    Rehab Potential Excellent    PT Frequency 2x / week    PT Duration 6 weeks    PT Treatment/Interventions ADLs/Self Care Home Management;Therapeutic exercise;Patient/family education;Passive range of motion;Manual lymph drainage;Manual techniques;Scar mobilization    PT Next Visit Plan MLD left breast and instruct pt,Cont MFR to cording especially large cord at medial elbow, PROM left shoulder, STM prn to med,lateral arm/pecs, started radiation ,    PT Home Exercise Plan Post op shoulder ROM HEP, desensitization techniques, compression bra and sleeve, supine scap. series.    Recommended Other Services Has sleeve and bra now    Consulted and Agree with Plan of Care Patient           Patient will benefit from skilled therapeutic intervention in order to improve the following deficits and impairments:  Postural dysfunction,Decreased range of motion,Impaired UE functional use,Pain,Decreased knowledge of precautions,Decreased scar mobility,Increased  fascial restricitons,Increased edema,Impaired flexibility  Visit Diagnosis: Stiffness of left shoulder, not elsewhere classified  Aftercare following surgery for neoplasm  Abnormal posture  Malignant neoplasm of upper-outer quadrant of left breast in female, estrogen receptor positive ([place])  Localized edema     Problem  List Patient Active Problem List   Diagnosis Date Noted  . Lymphedema of left arm 02/02/2021  . Drug-induced neutropenia ([place]) 09/30/2020  . Genetic testing 06/26/2020  . Family history of ovarian cancer   . Family history of prostate cancer   . Malignant neoplasm of upper-outer quadrant of left breast in female, estrogen receptor positive ([place]) 06/03/2020    Claris Pong 02/05/2021, 12:07 PM  [place], [place], [address] Phone: [phone]   Fax:  [phone]  Name: KADEE PHILYAW MRN: [id number] Date of Birth: Jul 06, 1986 Cheral Almas, PT 02/05/21 12:08 PM

## 2021-02-06 ENCOUNTER — Ambulatory Visit
Admission: RE | Admit: 2021-02-06 | Discharge: 2021-02-06 | Disposition: A | Discharge status: Home / Self Care | Payer: 59 | Source: Ambulatory Visit | Attending: Radiation Oncology | Admitting: Radiation Oncology

## 2021-02-06 ENCOUNTER — Other Ambulatory Visit: Payer: Self-pay

## 2021-02-06 DIAGNOSIS — C50412 Malignant neoplasm of upper-outer quadrant of left female breast: Secondary | ICD-10-CM | POA: Diagnosis not present

## 2021-02-09 ENCOUNTER — Other Ambulatory Visit: Payer: Self-pay

## 2021-02-09 ENCOUNTER — Ambulatory Visit
Admission: RE | Admit: 2021-02-09 | Discharge: 2021-02-09 | Disposition: A | Discharge status: Home / Self Care | Payer: 59 | Source: Ambulatory Visit | Attending: Radiation Oncology | Admitting: Radiation Oncology

## 2021-02-09 DIAGNOSIS — C50412 Malignant neoplasm of upper-outer quadrant of left female breast: Secondary | ICD-10-CM | POA: Diagnosis not present

## 2021-02-10 ENCOUNTER — Ambulatory Visit
Admission: RE | Admit: 2021-02-10 | Discharge: 2021-02-10 | Disposition: A | Discharge status: Home / Self Care | Payer: 59 | Source: Ambulatory Visit | Attending: Radiation Oncology | Admitting: Radiation Oncology

## 2021-02-10 ENCOUNTER — Ambulatory Visit: Payer: 59

## 2021-02-10 DIAGNOSIS — M25612 Stiffness of left shoulder, not elsewhere classified: Secondary | ICD-10-CM

## 2021-02-10 DIAGNOSIS — C50412 Malignant neoplasm of upper-outer quadrant of left female breast: Secondary | ICD-10-CM | POA: Diagnosis not present

## 2021-02-10 DIAGNOSIS — R293 Abnormal posture: Secondary | ICD-10-CM

## 2021-02-10 DIAGNOSIS — R600 Localized edema: Secondary | ICD-10-CM

## 2021-02-10 DIAGNOSIS — Z483 Aftercare following surgery for neoplasm: Secondary | ICD-10-CM

## 2021-02-10 DIAGNOSIS — Z170 Estrogen receptor positive status [ER+]: Secondary | ICD-10-CM

## 2021-02-10 NOTE — Therapy (Signed)
[place], [place], [address] Phone: [phone]   Fax:  [phone]  Physical Therapy Treatment  Patient Details  Name: Jeanne Evans MRN: [id number] Date of Birth: 04-15-1986 Referring Provider (PT): Dr. Erroll Luna   Encounter Date: 02/10/2021   PT End of Session - 02/10/21 1200    Visit Number 16    Number of Visits 22    Date for PT Re-Evaluation 02/26/21    PT Start Time 1105    PT Stop Time 1155    PT Time Calculation (min) 50 min    Activity Tolerance Patient tolerated treatment well    Behavior During Therapy [facility] for tasks assessed/performed           Past Medical History:  Diagnosis Date  . Breast cancer ([place])   . Family history of ovarian cancer   . Family history of prostate cancer   . GERD (gastroesophageal reflux disease)   . History of asthma    has albuterol, uses PRN  . History of heart murmur in childhood   . History of multiple allergies     Past Surgical History:  Procedure Laterality Date  . BREAST LUMPECTOMY WITH RADIOACTIVE SEED AND SENTINEL LYMPH NODE BIOPSY Left 11/19/2020   Procedure: LEFT BREAST LUMPECTOMY X 2 WITH RADIOACTIVE SEED AND SENTINEL LYMPH NODE MAPPING;  Surgeon: Erroll Luna, MD;  Location: [place];  Service: General;  Laterality: Left;  PECTORAL BLOCK  . PORTACATH PLACEMENT Right 06/18/2020   Procedure: INSERTION PORT-A-CATH WITH ULTRASOUND GUIDANCE;  Surgeon: Erroll Luna, MD;  Location: [place];  Service: General;  Laterality: Right;  . RE-EXCISION OF BREAST LUMPECTOMY Left 12/09/2020   Procedure: RE-EXCISION OF LEFT BREAST LUMPECTOMY;  Surgeon: Erroll Luna, MD;  Location: [place];  Service: General;  Laterality: Left;    There were no vitals filed for this visit.   Subjective Assessment - 02/10/21 1105    Subjective I forgot my sleeve at home today and my arm is a little more  sensitive medially today. Had radiation earlier today.  Seems to be going OK.  Skin is doing fine.  Some days I am more tired than others.    Pertinent History Patient was diagnosed on 05/12/2020 with left breast cancer. She underwent neoadjuvant chemotherapy 06/19/2020-09/23/2020 and had a left lumpectomy and sentinel node biopsy (6 negative nodes) on 11/19/2020.  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    Currently in Pain? No/denies    Pain Score 0-No pain                             [facility] Adult PT Treatment/Exercise - 02/10/21 0001      Manual Therapy   Soft tissue mobilization to scar region of area of firmness, left  pectoralis and lats,with cocobutter    Myofascial Release left axillary region and upper arm and  especially to large cord near elbow    Manual Lymphatic Drainage (MLD) supraclavicular, right axillary LN, left inguinal LN and interaxillary pathway and axillo inguinal pathway, and left breast retracing all pathways and ending with LN's.  Pt instructed in and practiced same.    Passive ROM left shoulder flex, abd, IR and ER and D2 flex with VC's to relax                  PT Education - 02/10/21 1202  Education Details Pt was educated in self breast MLD    Person(s) Educated Patient    Methods Explanation;Demonstration;Handout    Comprehension Returned demonstration;Need further instruction;Verbalized understanding               PT Long Term Goals - 01/15/21 1048      PT LONG TERM GOAL #1   Title Patient will demonstrate she has regained full shoulder ROM and function post operatively compared to baselines.    Time 6    Period Weeks    Status On-going    Target Date 02/26/21      PT LONG TERM GOAL #2   Title Patient will increase left shoulder active flexion to >/= 135 degrees for increased ease reaching.    Baseline 109 post/  163 today    Time 6    Period Weeks    Status Achieved      PT LONG TERM GOAL #3   Title  Patient will increase left shoulder active abduction to >/= 165 degrees for increased ease reaching.    Baseline 150 today    Time 6    Period Weeks    Status On-going    Target Date 02/26/21      PT LONG TERM GOAL #4   Title Patient will improve her DASH score to be </= 20 for improved overall upper extremity function.    Baseline 70.45 post op; 18.18 pre-op/  36% today    Time 6    Period Weeks    Status On-going    Target Date 02/26/21      PT LONG TERM GOAL #5   Title Patient will report >/= 50% decrease in pain for increased tolerance of daily tasks.    Baseline 45% better    Time 6    Period Weeks    Status On-going    Target Date 02/05/21                 Plan - 02/10/21 1201    Clinical Impression Statement Cording at axilla improved but still present.  Initiated pt. self MLD today with pt requiring occasional VC's and TC's but overall doing well.   ROM continues to be improved but is still limited at end ranges of motion    Stability/Clinical Decision Making Stable/Uncomplicated    Rehab Potential Excellent    PT Frequency 2x / week    PT Duration 6 weeks    PT Treatment/Interventions ADLs/Self Care Home Management;Therapeutic exercise;Patient/family education;Passive range of motion;Manual lymph drainage;Manual techniques;Scar mobilization    PT Next Visit Plan MLD left breast and instruct pt,Cont MFR to cording especially large cord at medial elbow, PROM left shoulder, STM prn to med,lateral arm/pecs, started radiation ,    PT Home Exercise Plan Post op shoulder ROM HEP, desensitization techniques, compression bra and sleeve, supine scap. series.    Consulted and Agree with Plan of Care Patient           Patient will benefit from skilled therapeutic intervention in order to improve the following deficits and impairments:  Postural dysfunction,Decreased range of motion,Impaired UE functional use,Pain,Decreased knowledge of precautions,Decreased scar  mobility,Increased fascial restricitons,Increased edema,Impaired flexibility  Visit Diagnosis: Stiffness of left shoulder, not elsewhere classified  Aftercare following surgery for neoplasm  Abnormal posture  Malignant neoplasm of upper-outer quadrant of left breast in female, estrogen receptor positive ([place])  Localized edema     Problem List Patient Active Problem List   Diagnosis Date Noted  .  Lymphedema of left arm 02/02/2021  . Drug-induced neutropenia ([place]) 09/30/2020  . Genetic testing 06/26/2020  . Family history of ovarian cancer   . Family history of prostate cancer   . Malignant neoplasm of upper-outer quadrant of left breast in female, estrogen receptor positive ([place]) 06/03/2020    Claris Pong 02/10/2021, 12:05 PM  [place], [place], [address] Phone: [phone]   Fax:  [phone]  Name: Jeanne Evans MRN: [id number] Date of Birth: 03-24-1986  Cheral Almas, PT 02/10/21 12:06 PM

## 2021-02-10 NOTE — Patient Instructions (Signed)
Manual Lymph Drainage for Left Breast.  Do daily.  Do slowly. Use flat hands with just enough pressure to stretch the skin. Do not slide over the skin, but move the skin with the hand you're using. Lie down or sit comfortably (in a recliner, for example) to do this.  1) Hug yourself:  cross arms and do circles at collar bones near neck 5-7 times (to "wake up" lots of lymph nodes in this area). 2) Take slow deep breaths, allowing your belly to balloon out as your breathe in, 5x (to "wake up" abdominal lymph nodes to take on extra fluid). 3) Right armpit-stretch skin in small circles to stimulate intact lymph nodes there, 5-7x. 4) Left groin area, at panty line-stretch skin in small circles to stimulate lymph nodes 5-7x. 5) Redirect fluid from left chest toward right armpit (stretch skin starting at left chest in 3-4 spots working toward right armpit) 3-4x across the chest. 6) Redirect fluid from left armpit toward left groin (cup your hand around the curve of your left side and do 3-4 "pumps" from armpit to groin) 3-4x down your side. 7) Draw an imaginary diagonal line from upper outer breast through the nipple area toward lower inner breast.  Direct fluid upward and inward from this line toward the pathway across your upper chest (established in #5).  Do this in three rows to treat all of the upper inner breast tissue, and do each row 3-4x. 8) Then repeat #5 above. 9) Direct fluid to treat all of lower outer breast tissue downward and outward toward pathway established in #6 that is aimed at the left groin. 10)  Then repeat #6 above. 11)  End with repeating #3 and #4 above.   [facility] [address] [place], [place]   [address] [phone] 

## 2021-02-11 ENCOUNTER — Ambulatory Visit
Admission: RE | Admit: 2021-02-11 | Discharge: 2021-02-11 | Disposition: A | Discharge status: Home / Self Care | Payer: 59 | Source: Ambulatory Visit | Attending: Radiation Oncology | Admitting: Radiation Oncology

## 2021-02-11 ENCOUNTER — Other Ambulatory Visit: Payer: Self-pay

## 2021-02-11 DIAGNOSIS — C50412 Malignant neoplasm of upper-outer quadrant of left female breast: Secondary | ICD-10-CM | POA: Diagnosis not present

## 2021-02-12 ENCOUNTER — Encounter: Payer: Self-pay | Admitting: Adult Health

## 2021-02-12 ENCOUNTER — Inpatient Hospital Stay (HOSPITAL_BASED_OUTPATIENT_CLINIC_OR_DEPARTMENT_OTHER): Payer: 59 | Admitting: Adult Health

## 2021-02-12 ENCOUNTER — Ambulatory Visit
Admission: RE | Admit: 2021-02-12 | Discharge: 2021-02-12 | Disposition: A | Discharge status: Home / Self Care | Payer: 59 | Source: Ambulatory Visit | Attending: Radiation Oncology | Admitting: Radiation Oncology

## 2021-02-12 ENCOUNTER — Inpatient Hospital Stay: Payer: 59

## 2021-02-12 ENCOUNTER — Other Ambulatory Visit (HOSPITAL_COMMUNITY): Payer: Self-pay

## 2021-02-12 ENCOUNTER — Ambulatory Visit: Payer: 59

## 2021-02-12 ENCOUNTER — Other Ambulatory Visit: Payer: Self-pay | Admitting: Oncology

## 2021-02-12 VITALS — BP 125/82 | HR 86 | Temp 98.1°F | Resp 18 | Ht 63.0 in | Wt 183.8 lb

## 2021-02-12 DIAGNOSIS — Z170 Estrogen receptor positive status [ER+]: Secondary | ICD-10-CM | POA: Diagnosis not present

## 2021-02-12 DIAGNOSIS — C50412 Malignant neoplasm of upper-outer quadrant of left female breast: Secondary | ICD-10-CM

## 2021-02-12 DIAGNOSIS — R600 Localized edema: Secondary | ICD-10-CM

## 2021-02-12 DIAGNOSIS — R293 Abnormal posture: Secondary | ICD-10-CM

## 2021-02-12 DIAGNOSIS — M25612 Stiffness of left shoulder, not elsewhere classified: Secondary | ICD-10-CM

## 2021-02-12 DIAGNOSIS — Z483 Aftercare following surgery for neoplasm: Secondary | ICD-10-CM

## 2021-02-12 LAB — CBC WITH DIFFERENTIAL/PLATELET
Abs Immature Granulocytes: 0 10*3/uL (ref 0.00–0.07)
Basophils Absolute: 0 10*3/uL (ref 0.0–0.1)
Basophils Relative: 1 %
Eosinophils Absolute: 0.2 10*3/uL (ref 0.0–0.5)
Eosinophils Relative: 8 %
HCT: 35.6 % — ABNORMAL LOW (ref 36.0–46.0)
Hemoglobin: 12.3 g/dL (ref 12.0–15.0)
Immature Granulocytes: 0 %
Lymphocytes Relative: 44 %
Lymphs Abs: 1.4 10*3/uL (ref 0.7–4.0)
MCH: 28.9 pg (ref 26.0–34.0)
MCHC: 34.6 g/dL (ref 30.0–36.0)
MCV: 83.8 fL (ref 80.0–100.0)
Monocytes Absolute: 0.4 10*3/uL (ref 0.1–1.0)
Monocytes Relative: 14 %
Neutro Abs: 1 10*3/uL — ABNORMAL LOW (ref 1.7–7.7)
Neutrophils Relative %: 33 %
Platelets: 241 10*3/uL (ref 150–400)
RBC: 4.25 MIL/uL (ref 3.87–5.11)
RDW: 16 % — ABNORMAL HIGH (ref 11.5–15.5)
WBC: 3.1 10*3/uL — ABNORMAL LOW (ref 4.0–10.5)
nRBC: 0 % (ref 0.0–0.2)

## 2021-02-12 LAB — COMPREHENSIVE METABOLIC PANEL
ALT: 22 U/L (ref 0–44)
AST: 19 U/L (ref 15–41)
Albumin: 4.2 g/dL (ref 3.5–5.0)
Alkaline Phosphatase: 80 U/L (ref 38–126)
Anion gap: 11 (ref 5–15)
BUN: 10 mg/dL (ref 6–20)
CO2: 27 mmol/L (ref 22–32)
Calcium: 9.2 mg/dL (ref 8.9–10.3)
Chloride: 105 mmol/L (ref 98–111)
Creatinine, Ser: 0.87 mg/dL (ref 0.44–1.00)
GFR, Estimated: >60 mL/min (ref >60)
Glucose, Bld: 98 mg/dL (ref 70–99)
Potassium: 3.8 mmol/L (ref 3.5–5.1)
Sodium: 143 mmol/L (ref 135–145)
Total Bilirubin: 0.7 mg/dL (ref 0.3–1.2)
Total Protein: 7.8 g/dL (ref 6.5–8.1)

## 2021-02-12 MED ORDER — VENLAFAXINE HCL ER 75 MG PO CP24: 75.0000 mg | ORAL_CAPSULE | Freq: Every day | ORAL | 4 refills | Status: DC

## 2021-02-12 MED ORDER — GABAPENTIN 300 MG PO CAPS: 300.0000 mg | ORAL_CAPSULE | Freq: Every day | ORAL | 4 refills | Status: DC

## 2021-02-12 NOTE — Progress Notes (Signed)
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  She is unaccompanied.  She began capecitabine on on 01/26/2021 and concurrent radiation therapy on 01/22/2021. Her final radiation treatment is scheduled for 03/09/2021.  Jeanne Evans is taking Capecitabine BID.  She is tolerating this moderately well.  She did miss a dose last week.  She is requesting a refill for her Gabapentin and Effexor.    REVIEW OF SYSTEMS: Jeanne Evans is having some difficulty with her left arm and cording and is seeing PT regularly.  She is anxious because her work has said that she must return to work on April 12.  She works at the [employer]  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  She is unsure whether or not she will be able to have accommodations at her work.    She notes that she is fatigued with her radiation therapy.  She denies any skin damage, mucositis, diarrhea or skin peeling on her hands and feet.  She is still working on increasing her activity secondary to her fatigue.  Otherwise she is doing well and a detailed ROS was  non contributory.      COVID 19 VACCINATION STATUS: [place] x2, most recently 01/2021   HISTORY OF CURRENT ILLNESS: From the original intake note:  Jeanne Evans herself palpated a painful left breast lump. She underwent bilateral diagnostic mammography with tomography and left breast ultrasonography at [facility] [place] on 05/12/2020 showing: breast density category B; palpable 2.7 cm oval hypoechoic mass in left breast at 2-3 o'clock; 2.2 cm left axillary lymph node/grouping of nodes.  Accordingly on 05/29/2020 she proceeded to biopsy of the left breast area in question. The pathology from this procedure ([id number]) showed: invasive ductal carcinoma, grade 3. Prognostic indicators significant for: estrogen receptor, 10% positive with weak staining intensity and progesterone receptor, 0% negative. Proliferation marker Ki67 at 95%. HER2 equivocal by immunohistochemistry (2+), but negative by fluorescent in situ hybridization with a signals ratio 1.77 and number per cell 2.65.  Biopsy of the left axillary lymph node in question was negative and concordant  The patient's subsequent history is as detailed below.   PAST MEDICAL HISTORY: Past Medical History:  Diagnosis Date  . Breast cancer ([place])   . Family history of ovarian cancer   . Family history of prostate cancer   . GERD (gastroesophageal reflux disease)   . History of asthma    has albuterol, uses PRN  . History of heart murmur in childhood   . History of multiple allergies   History of allergy related headaches   PAST SURGICAL HISTORY: Past Surgical History:  Procedure Laterality Date  . BREAST LUMPECTOMY WITH RADIOACTIVE SEED AND SENTINEL LYMPH NODE BIOPSY Left 11/19/2020  Procedure: LEFT BREAST LUMPECTOMY X 2 WITH RADIOACTIVE SEED AND SENTINEL LYMPH NODE MAPPING;  Surgeon: Erroll Luna, MD;  Location: [place];  Service: General;  Laterality: Left;  PECTORAL BLOCK  . PORTACATH PLACEMENT Right  06/18/2020   Procedure: INSERTION PORT-A-CATH WITH ULTRASOUND GUIDANCE;  Surgeon: Erroll Luna, MD;  Location: [place];  Service: General;  Laterality: Right;  . RE-EXCISION OF BREAST LUMPECTOMY Left 12/09/2020   Procedure: RE-EXCISION OF LEFT BREAST LUMPECTOMY;  Surgeon: Erroll Luna, MD;  Location: [facility];  Service: General;  Laterality: Left;  She reports having a boil lanced from her perineal area   FAMILY HISTORY: Family History  Problem Relation Age of Onset  . Lung cancer Maternal Uncle        dx late 27s  . Prostate cancer Paternal Uncle        dx late 34s  . Ovarian cancer Paternal Grandmother        dx 70s  . Prostate cancer Paternal Grandfather        dx 33s  . Prostate cancer Paternal Uncle        dx early 60s  The patient's father is 66 and her mother 74, as of 05/2020.  The patient has two brothers and one sister. She reports ovarian cancer in her paternal grandmother, prostate cancer in her paternal grandfather and a paternal uncle, and lung cancer in a maternal uncle.   GYNECOLOGIC HISTORY:  No LMP recorded. (Menstrual status: IUD). Menarche: 35 years old Age at first live birth: 35 years old Jeanne Evans P 2 LMP current, experiences spotting Contraceptive: Mirena IUD in place HRT n/a  Hysterectomy? no BSO? 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 She describes herself as single. She lives at home with her children, Jeanne Evans (age 70) and Jeanne Evans (age 32).  She is originally from [place] her home.    ADVANCED DIRECTIVES: Not in place. She intends to name her mother, Jeanne Evans, as her HCPOA.  Jeanne Evans lives in [place] and can be reached at [phone].  The patient was given the appropriate documents to complete and notarized at her discretion at the time of her visit 06/04/2020   HEALTH  MAINTENANCE: Social History   Tobacco Use  . Smoking status: Never Smoker  . Smokeless tobacco: Never Used  Substance Use Topics  . Alcohol use: Yes    Comment: occas  . Drug use: Yes    Types: Marijuana     Colonoscopy: n/a (age)  PAP: 04/2020  Bone density: n/a (age)   Allergies  Allergen Reactions  . Sulfa Antibiotics Hives    Current Outpatient Medications  Medication Sig Dispense Refill  . Acetaminophen (TYLENOL) 325 MG CAPS Take by mouth.    . capecitabine (XELODA) 500 MG tablet Take 2 tablets (1,000 mg total) by mouth 2 (two) times daily after a meal. Take only on radiation days. Start Monday 01/26/2021 84 tablet 0  . ibuprofen (ADVIL) 800 MG tablet Take 1 tablet (800 mg total) by mouth every 8 (eight) hours as needed. 30 tablet 0  . levonorgestrel (MIRENA) 20 MCG/24HR IUD 1 each by Intrauterine route once.    Marland Kitchen omeprazole (PRILOSEC) 40 MG capsule Take 1 capsule (40 mg total) by mouth at bedtime. 30 capsule 3  . venlafaxine XR (EFFEXOR-XR) 75 MG 24 hr capsule Take 1 capsule (75 mg total) by mouth  daily with breakfast. 90 capsule 4  . gabapentin (NEURONTIN) 300 MG capsule Take 1 capsule (300 mg total) by mouth at bedtime. 90 capsule 4  . magic mouthwash SOLN Take 5 mLs by mouth 4 (four) times daily as needed for mouth pain. 240 mL 1  . naproxen sodium (ALEVE) 220 MG tablet Take 220 mg by mouth. (Patient not taking: Reported on 02/12/2021)     No current facility-administered medications for this visit.    OBJECTIVE: African-American woman in no acute distress Vitals:   02/12/21 0841  BP: 125/82  Pulse: 86  Resp: 18  Temp: 98.1 F (36.7 C)  SpO2: 100%     Body mass index is 32.56 kg/m.   Wt Readings from Last 3 Encounters:  02/12/21 183 lb 12.8 oz (83.4 kg)  02/02/21 184 lb 12.8 oz (83.8 kg)  01/19/21 183 lb 6.4 oz (83.2 kg)  ECOG FS:1 - Symptomatic but completely ambulatory GENERAL: Patient is a well appearing female in no acute distress HEENT:  Sclerae  anicteric.  Mask in place. Neck is supple.  NODES:  No cervical, supraclavicular, or axillary lymphadenopathy palpated.  BREAST EXAM:  Inspected, no skin breakdown noted LUNGS:  Clear to auscultation bilaterally.  No wheezes or rhonchi. HEART:  Regular rate and rhythm. No murmur appreciated. ABDOMEN:  Soft, nontender.  Positive, normoactive bowel sounds. No organomegaly palpated. MSK:  No focal spinal tenderness to palpation. Full range of motion bilaterally in the upper extremities. EXTREMITIES:  No peripheral edema.   SKIN:  Clear with no obvious rashes or skin changes. No nail dyscrasia. NEURO:  Nonfocal. Well oriented.  Appropriate affect.     LAB RESULTS:  CMP     Component Value Date/Time   NA 141 01/19/2021 1025   K 3.9 01/19/2021 1025   CL 107 01/19/2021 1025   CO2 26 01/19/2021 1025   GLUCOSE 99 01/19/2021 1025   BUN 11 01/19/2021 1025   CREATININE 0.81 01/19/2021 1025   CREATININE 0.92 06/04/2020 0830   CALCIUM 9.0 01/19/2021 1025   PROT 7.2 01/19/2021 1025   ALBUMIN 4.0 01/19/2021 1025   AST 15 01/19/2021 1025   AST 13 (L) 06/04/2020 0830   ALT 17 01/19/2021 1025   ALT 14 06/04/2020 0830   ALKPHOS 71 01/19/2021 1025   BILITOT 0.5 01/19/2021 1025   BILITOT 0.5 06/04/2020 0830   GFRNONAA >60 01/19/2021 1025   GFRNONAA >60 06/04/2020 0830   GFRAA >60 08/05/2020 0955   GFRAA >60 06/04/2020 0830    No results found for: TOTALPROTELP, ALBUMINELP, A1GS, A2GS, BETS, BETA2SER, GAMS, MSPIKE, SPEI  Lab Results  Component Value Date   WBC 3.1 (L) 02/12/2021   NEUTROABS 1.0 (L) 02/12/2021   HGB 12.3 02/12/2021   HCT 35.6 (L) 02/12/2021   MCV 83.8 02/12/2021   PLT 241 02/12/2021    No results found for: LABCA2  No components found for: UQKZPL199  No results for input(s): INR in the last 168 hours.  No results found for: LABCA2  No results found for: XNI607  No results found for: RFJ449  No results found for: VBC288  No results found for: CA2729  No  components found for: HGQUANT  No results found for: CEA1 / No results found for: CEA1   No results found for: AFPTUMOR  No results found for: CHROMOGRNA  No results found for: KPAFRELGTCHN, LAMBDASER, KAPLAMBRATIO (kappa/lambda light chains)  No results found for: HGBA, HGBA2QUANT, HGBFQUANT, HGBSQUAN (Hemoglobinopathy evaluation)   No results found for: LDH  No results found for: IRON, TIBC, IRONPCTSAT (Iron and TIBC)  No results found for: FERRITIN  Urinalysis No results found for: COLORURINE, APPEARANCEUR, LABSPEC, PHURINE, GLUCOSEU, HGBUR, BILIRUBINUR, KETONESUR, PROTEINUR, UROBILINOGEN, NITRITE, LEUKOCYTESUR   STUDIES: No results found.   ELIGIBLE FOR AVAILABLE RESEARCH PROTOCOL: no  ASSESSMENT: 35 y.o. [place] woman status post left breast upper outer quadrant biopsy 05/29/2020 for a clinical T2 N0, stage IIB invasive ductal carcinoma, functionally triple negative, with an MIB-1 of 95%.  (1) genetics testing 06/23/2020 through the [facility] Multi-Cancer Panel found no deleterious mutations in AIP, ALK, APC, ATM, AXIN2,BAP1,  BARD1, BLM, BMPR1A, BRCA1, BRCA2, BRIP1, CASR, CDC73, CDH1, CDK4, CDKN1B, CDKN1C, CDKN2A (p14ARF), CDKN2A (p16INK4a), CEBPA, CHEK2, CTNNA1, DICER1, DIS3L2, EGFR (c.2369C>T, p.Thr790Met variant only), EPCAM (Deletion/duplication testing only), FH, FLCN, GATA2, GPC3, GREM1 (Promoter region deletion/duplication testing only), HOXB13 (c.251G>A, p.Gly84Glu), HRAS, KIT, MAX, MEN1, MET, MITF (c.952G>A, p.Glu318Lys variant only), MLH1, MSH2, MSH3, MSH6, MUTYH, NBN, NF1, NF2, NTHL1, PALB2, PDGFRA, PHOX2B, PMS2, POLD1, POLE, POT1, PRKAR1A, PTCH1, PTEN, RAD50, RAD51C, RAD51D, RB1, RECQL4, RET, RNF43, RUNX1, SDHAF2, SDHA (sequence changes only), SDHB, SDHC, SDHD, SMAD4, SMARCA4, SMARCB1, SMARCE1, STK11, SUFU, TERC, TERT, TMEM127, TP53, TSC1, TSC2, VHL, WRN and WT1.   (2) neoadjuvant chemotherapy consisting of cyclophosphamide and doxorubicin in dose dense fashion  x4 started 06/19/2020, completed 08/05/2020, followed by paclitaxel and carboplatin weekly x12 starting 08/26/2020, completing 4 doses 09/23/2020  (a) echocardiogram on 06/13/2020 showed an EF of 60-65%  (b) paclitaxel and carboplatin discontinued after 4 doses because of neuropathy and cytopenias  (3) status post left lumpectomy and sentinel lymph node sampling 11/19/2020 for a residual yp T1c ypN0 invasive ductal carcinoma involving the superior margin  (a) additional surgery 12/09/2020 cleared the margin in question  (4) postmastectomy radiation started 01/22/2021:   (a) receiving capecitabine sensitization, started 01/26/2021  (5) to start pembrolizumab/Keytruda after the completion of radiation   PLAN: Adler is doing well today.  She continues on Capecitabine BID with good tolerance.  Her diarrhea has resolved.  She has no skin changes to her hands and feet, she has no skin breakdown from her adjuvant radiaiton therapy.  Her ANC is 1 today.    Keryn notes she has increased anxiety related to her job.  I reviewed with her the risks of going back to her work without an accommodation due to her lymphedema and cording.  She is going to have her employer fax [place] an accommodation form.  I gave her our fax number.    I refilled the Gabapentin and Effexor at her request.  Since she is on a daily dose of Effexor, I changed the formulation to extended release.  Hopefully this will help with her increased stress level.    I recommended she continue to exercise as she is able.  We will see her back in a few weeks for labs and then again at radiation completion for her f/u with Dr. Jana Hakim.  She knows to call for any other issue that may develop before the next visit.  Total encounter time 30 minutes.Wilber Bihari, NP 02/12/21 8:56 AM Medical Oncology and Hematology [facility] [place], [place] [address] Tel. [phone]    Fax.  [phone]    *Total Encounter Time as defined by the Centers for Medicare and Medicaid Services includes, in addition to the face-to-face time of a patient visit (documented in the note above) non-face-to-face time: obtaining and reviewing outside history, ordering and reviewing medications, tests or  procedures, care coordination (communications with other health care professionals or caregivers) and documentation in the medical record.

## 2021-02-12 NOTE — Therapy (Signed)
[place], [place], [address] Phone: [phone]   Fax:  [phone]  Physical Therapy Treatment  Patient Details  Name: Jeanne Evans MRN: [id number] Date of Birth: 12-Aug-1986 Referring Provider (PT): Dr. Erroll Luna   Encounter Date: 02/12/2021   PT End of Session - 02/12/21 1132    Visit Number 17    Number of Visits 22    Date for PT Re-Evaluation 02/26/21    PT Start Time 1107    PT Stop Time 1156    PT Time Calculation (min) 49 min    Activity Tolerance Patient tolerated treatment well    Behavior During Therapy [facility] for tasks assessed/performed           Past Medical History:  Diagnosis Date  . Breast cancer ([place])   . Family history of ovarian cancer   . Family history of prostate cancer   . GERD (gastroesophageal reflux disease)   . History of asthma    has albuterol, uses PRN  . History of heart murmur in childhood   . History of multiple allergies     Past Surgical History:  Procedure Laterality Date  . BREAST LUMPECTOMY WITH RADIOACTIVE SEED AND SENTINEL LYMPH NODE BIOPSY Left 11/19/2020   Procedure: LEFT BREAST LUMPECTOMY X 2 WITH RADIOACTIVE SEED AND SENTINEL LYMPH NODE MAPPING;  Surgeon: Erroll Luna, MD;  Location: [place];  Service: General;  Laterality: Left;  PECTORAL BLOCK  . PORTACATH PLACEMENT Right 06/18/2020   Procedure: INSERTION PORT-A-CATH WITH ULTRASOUND GUIDANCE;  Surgeon: Erroll Luna, MD;  Location: [place];  Service: General;  Laterality: Right;  . RE-EXCISION OF BREAST LUMPECTOMY Left 12/09/2020   Procedure: RE-EXCISION OF LEFT BREAST LUMPECTOMY;  Surgeon: Erroll Luna, MD;  Location: [place];  Service: General;  Laterality: Left;    There were no vitals filed for this visit.   Subjective Assessment - 02/12/21 1107    Subjective I had my radiation this am.  Sleeve is still sliding down a  little. No present pain in arm but the numbness in the back of the arm is constant and bothersome.  The compression sleeve does help some    Pertinent History Patient was diagnosed on 05/12/2020 with left breast cancer. She underwent neoadjuvant chemotherapy 06/19/2020-09/23/2020 and had a left lumpectomy and sentinel node biopsy (6 negative nodes) on 11/19/2020.  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    Patient Stated Goals Decrease pain and improve shoulder ROM    Currently in Pain? No/denies                             [facility] Adult PT Treatment/Exercise - 02/12/21 0001      Shoulder Exercises: Supine   Horizontal ABduction Strengthening;Both;10 reps    Theraband Level (Shoulder Horizontal ABduction) Level 1 (Yellow)    External Rotation Strengthening;Both;10 reps    Theraband Level (Shoulder External Rotation) Level 1 (Yellow)    Flexion Strengthening;Both;10 reps    Theraband Level (Shoulder Flexion) Level 1 (Yellow)    Diagonals Strengthening;Right;Left;10 reps    Theraband Level (Shoulder Diagonals) Level 1 (Yellow)      Manual Therapy   Myofascial Release left axillary region and upper arm and  especially to large cord near elbow    Manual Lymphatic Drainage (MLD) supraclavicular, right axillary LN, left inguinal LN and interaxillary pathway and axillo inguinal pathway, and  left breast retracing all pathways and ending with LN's.  Pt instructed in and practiced same.    Passive ROM left shoulder flex, abd, IR and ER and D2 flex with VC's to relax                       PT Long Term Goals - 01/15/21 1048      PT LONG TERM GOAL #1   Title Patient will demonstrate she has regained full shoulder ROM and function post operatively compared to baselines.    Time 6    Period Weeks    Status On-going    Target Date 02/26/21      PT LONG TERM GOAL #2   Title Patient will increase left shoulder active flexion to >/= 135 degrees for increased  ease reaching.    Baseline 109 post/  163 today    Time 6    Period Weeks    Status Achieved      PT LONG TERM GOAL #3   Title Patient will increase left shoulder active abduction to >/= 165 degrees for increased ease reaching.    Baseline 150 today    Time 6    Period Weeks    Status On-going    Target Date 02/26/21      PT LONG TERM GOAL #4   Title Patient will improve her DASH score to be </= 20 for improved overall upper extremity function.    Baseline 70.45 post op; 18.18 pre-op/  36% today    Time 6    Period Weeks    Status On-going    Target Date 02/26/21      PT LONG TERM GOAL #5   Title Patient will report >/= 50% decrease in pain for increased tolerance of daily tasks.    Baseline 45% better    Time 6    Period Weeks    Status On-going    Target Date 02/05/21                 Plan - 02/12/21 1133    Clinical Impression Statement Pt. demonstrates good improvement in shoulder ROM despite cording still being present and running to the elbow.  pts. biggest complaint is posterior arm numbness that is constant but improves somewhat with her compression sleeve. Started instructing pt in MLD and pt demonstrates good technique with intermittent VC's    Stability/Clinical Decision Making Stable/Uncomplicated    Rehab Potential Excellent    PT Frequency 2x / week    PT Duration 6 weeks    PT Treatment/Interventions ADLs/Self Care Home Management;Therapeutic exercise;Patient/family education;Passive range of motion;Manual lymph drainage;Manual techniques;Scar mobilization    PT Next Visit Plan MLD left breast and instruct pt,Cont MFR to cording especially large cord at medial elbow, PROM left shoulder, STM prn to med,lateral arm/pecs, started radiation , check goals   PT Home Exercise Plan Post op shoulder ROM HEP, desensitization techniques, compression bra and sleeve, supine scap. series.    Consulted and Agree with Plan of Care Patient           Patient will  benefit from skilled therapeutic intervention in order to improve the following deficits and impairments:  Postural dysfunction,Decreased range of motion,Impaired UE functional use,Pain,Decreased knowledge of precautions,Decreased scar mobility,Increased fascial restricitons,Increased edema,Impaired flexibility  Visit Diagnosis: Stiffness of left shoulder, not elsewhere classified  Aftercare following surgery for neoplasm  Abnormal posture  Malignant neoplasm of upper-outer quadrant of left breast in female, estrogen  receptor positive ([place])  Localized edema     Problem List Patient Active Problem List   Diagnosis Date Noted  . Lymphedema of left arm 02/02/2021  . Drug-induced neutropenia ([place]) 09/30/2020  . Genetic testing 06/26/2020  . Family history of ovarian cancer   . Family history of prostate cancer   . Malignant neoplasm of upper-outer quadrant of left breast in female, estrogen receptor positive ([place]) 06/03/2020    Claris Pong 02/12/2021, 12:00 PM  [place], [place], [address] Phone: [phone]   Fax:  [phone]  Name: Jeanne Evans MRN: [id number] Date of Birth: Jan 09, 1986 Cheral Almas, PT 02/12/21 12:03 PM

## 2021-02-13 ENCOUNTER — Ambulatory Visit
Admission: RE | Admit: 2021-02-13 | Discharge: 2021-02-13 | Disposition: A | Discharge status: Home / Self Care | Payer: 59 | Source: Ambulatory Visit | Attending: Radiation Oncology | Admitting: Radiation Oncology

## 2021-02-13 ENCOUNTER — Other Ambulatory Visit: Payer: Self-pay

## 2021-02-13 DIAGNOSIS — Z170 Estrogen receptor positive status [ER+]: Secondary | ICD-10-CM | POA: Insufficient documentation

## 2021-02-13 DIAGNOSIS — C50412 Malignant neoplasm of upper-outer quadrant of left female breast: Secondary | ICD-10-CM | POA: Insufficient documentation

## 2021-02-16 ENCOUNTER — Ambulatory Visit: Payer: 59 | Attending: Surgery

## 2021-02-16 ENCOUNTER — Other Ambulatory Visit (HOSPITAL_COMMUNITY): Payer: Self-pay

## 2021-02-16 ENCOUNTER — Ambulatory Visit
Admission: RE | Admit: 2021-02-16 | Discharge: 2021-02-16 | Disposition: A | Discharge status: Home / Self Care | Payer: 59 | Source: Ambulatory Visit | Attending: Radiation Oncology | Admitting: Radiation Oncology

## 2021-02-16 ENCOUNTER — Other Ambulatory Visit: Payer: Self-pay

## 2021-02-16 ENCOUNTER — Inpatient Hospital Stay: Payer: 59 | Attending: Oncology

## 2021-02-16 DIAGNOSIS — Z452 Encounter for adjustment and management of vascular access device: Secondary | ICD-10-CM | POA: Diagnosis present

## 2021-02-16 DIAGNOSIS — R600 Localized edema: Secondary | ICD-10-CM | POA: Diagnosis present

## 2021-02-16 DIAGNOSIS — R293 Abnormal posture: Secondary | ICD-10-CM | POA: Insufficient documentation

## 2021-02-16 DIAGNOSIS — Z171 Estrogen receptor negative status [ER-]: Secondary | ICD-10-CM | POA: Insufficient documentation

## 2021-02-16 DIAGNOSIS — M25612 Stiffness of left shoulder, not elsewhere classified: Secondary | ICD-10-CM

## 2021-02-16 DIAGNOSIS — C50412 Malignant neoplasm of upper-outer quadrant of left female breast: Secondary | ICD-10-CM | POA: Diagnosis present

## 2021-02-16 DIAGNOSIS — Z8042 Family history of malignant neoplasm of prostate: Secondary | ICD-10-CM | POA: Insufficient documentation

## 2021-02-16 DIAGNOSIS — L599 Disorder of the skin and subcutaneous tissue related to radiation, unspecified: Secondary | ICD-10-CM | POA: Diagnosis present

## 2021-02-16 DIAGNOSIS — Z8041 Family history of malignant neoplasm of ovary: Secondary | ICD-10-CM | POA: Diagnosis not present

## 2021-02-16 DIAGNOSIS — Z79899 Other long term (current) drug therapy: Secondary | ICD-10-CM | POA: Insufficient documentation

## 2021-02-16 DIAGNOSIS — Z483 Aftercare following surgery for neoplasm: Secondary | ICD-10-CM | POA: Diagnosis present

## 2021-02-16 DIAGNOSIS — Z9221 Personal history of antineoplastic chemotherapy: Secondary | ICD-10-CM | POA: Insufficient documentation

## 2021-02-16 DIAGNOSIS — Z923 Personal history of irradiation: Secondary | ICD-10-CM | POA: Insufficient documentation

## 2021-02-16 DIAGNOSIS — Z801 Family history of malignant neoplasm of trachea, bronchus and lung: Secondary | ICD-10-CM | POA: Diagnosis not present

## 2021-02-16 DIAGNOSIS — Z170 Estrogen receptor positive status [ER+]: Secondary | ICD-10-CM | POA: Diagnosis present

## 2021-02-16 LAB — COMPREHENSIVE METABOLIC PANEL
ALT: 20 U/L (ref 0–44)
AST: 16 U/L (ref 15–41)
Albumin: 4.2 g/dL (ref 3.5–5.0)
Alkaline Phosphatase: 81 U/L (ref 38–126)
Anion gap: 12 (ref 5–15)
BUN: 9 mg/dL (ref 6–20)
CO2: 24 mmol/L (ref 22–32)
Calcium: 9.2 mg/dL (ref 8.9–10.3)
Chloride: 104 mmol/L (ref 98–111)
Creatinine, Ser: 0.88 mg/dL (ref 0.44–1.00)
GFR, Estimated: >60 mL/min (ref >60)
Glucose, Bld: 116 mg/dL — ABNORMAL HIGH (ref 70–99)
Potassium: 4.1 mmol/L (ref 3.5–5.1)
Sodium: 140 mmol/L (ref 135–145)
Total Bilirubin: 0.5 mg/dL (ref 0.3–1.2)
Total Protein: 7.7 g/dL (ref 6.5–8.1)

## 2021-02-16 LAB — CBC WITH DIFFERENTIAL/PLATELET
Abs Immature Granulocytes: 0 10*3/uL (ref 0.00–0.07)
Basophils Absolute: 0 10*3/uL (ref 0.0–0.1)
Basophils Relative: 1 %
Eosinophils Absolute: 0.3 10*3/uL (ref 0.0–0.5)
Eosinophils Relative: 9 %
HCT: 38.3 % (ref 36.0–46.0)
Hemoglobin: 12.6 g/dL (ref 12.0–15.0)
Immature Granulocytes: 0 %
Lymphocytes Relative: 39 %
Lymphs Abs: 1.3 10*3/uL (ref 0.7–4.0)
MCH: 28.1 pg (ref 26.0–34.0)
MCHC: 32.9 g/dL (ref 30.0–36.0)
MCV: 85.5 fL (ref 80.0–100.0)
Monocytes Absolute: 0.4 10*3/uL (ref 0.1–1.0)
Monocytes Relative: 11 %
Neutro Abs: 1.3 10*3/uL — ABNORMAL LOW (ref 1.7–7.7)
Neutrophils Relative %: 40 %
Platelets: 235 10*3/uL (ref 150–400)
RBC: 4.48 MIL/uL (ref 3.87–5.11)
RDW: 16.3 % — ABNORMAL HIGH (ref 11.5–15.5)
WBC: 3.2 10*3/uL — ABNORMAL LOW (ref 4.0–10.5)
nRBC: 0 % (ref 0.0–0.2)

## 2021-02-16 MED FILL — Capecitabine Tab 500 MG: ORAL | 12 days supply | Qty: 45 | Fill #0 | Status: CN

## 2021-02-16 NOTE — Therapy (Signed)
[place], [place], [address] Phone: [phone]   Fax:  [phone]  Physical Therapy Treatment  Patient Details  Name: Jeanne Evans MRN: [id number] Date of Birth: 19-Feb-1986 Referring Provider (PT): Dr. Erroll Luna   Encounter Date: 02/16/2021   PT End of Session - 02/16/21 0854    Visit Number 18    Number of Visits 22    Date for PT Re-Evaluation 02/26/21    PT Start Time 0821    PT Stop Time 0857    PT Time Calculation (min) 36 min    Activity Tolerance Patient tolerated treatment well    Behavior During Therapy [facility] for tasks assessed/performed           Past Medical History:  Diagnosis Date  . Breast cancer ([place])   . Family history of ovarian cancer   . Family history of prostate cancer   . GERD (gastroesophageal reflux disease)   . History of asthma    has albuterol, uses PRN  . History of heart murmur in childhood   . History of multiple allergies     Past Surgical History:  Procedure Laterality Date  . BREAST LUMPECTOMY WITH RADIOACTIVE SEED AND SENTINEL LYMPH NODE BIOPSY Left 11/19/2020   Procedure: LEFT BREAST LUMPECTOMY X 2 WITH RADIOACTIVE SEED AND SENTINEL LYMPH NODE MAPPING;  Surgeon: Erroll Luna, MD;  Location: [place];  Service: General;  Laterality: Left;  PECTORAL BLOCK  . PORTACATH PLACEMENT Right 06/18/2020   Procedure: INSERTION PORT-A-CATH WITH ULTRASOUND GUIDANCE;  Surgeon: Erroll Luna, MD;  Location: [place];  Service: General;  Laterality: Right;  . RE-EXCISION OF BREAST LUMPECTOMY Left 12/09/2020   Procedure: RE-EXCISION OF LEFT BREAST LUMPECTOMY;  Surgeon: Erroll Luna, MD;  Location: [place];  Service: General;  Laterality: Left;    There were no vitals filed for this visit.   Subjective Assessment - 02/16/21 0823    Subjective Pt was 21 minutes late secondary to oversleeping.  Armpit area  on left is a litle tight/tender and I am starting to itch a little.  Pt reports trying MLD over the weekend 1 time. Overall pain in 60% better but still have alot of numbness in posterior arm.    Pertinent History Patient was diagnosed on 05/12/2020 with left breast cancer. She underwent neoadjuvant chemotherapy 06/19/2020-09/23/2020 and had a left lumpectomy and sentinel node biopsy (6 negative nodes) on 11/19/2020.  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    Patient Stated Goals Decrease pain and improve shoulder ROM    Currently in Pain? No/denies              [facility] PT Assessment - 02/16/21 0001      AROM   Left Shoulder Flexion 165 Degrees    Left Shoulder ABduction 168 Degrees                         [facility] Adult PT Treatment/Exercise - 02/16/21 0001      Shoulder Exercises: Supine   Horizontal ABduction Strengthening;Both;10 reps    Theraband Level (Shoulder Horizontal ABduction) Level 2 (Red)    External Rotation Strengthening;Both;10 reps    Theraband Level (Shoulder External Rotation) Level 2 (Red)    Flexion Strengthening;Both;10 reps    Theraband Level (Shoulder Flexion) Level 2 (Red)    Diagonals Strengthening;Right;Left;10 reps    Theraband Level (Shoulder Diagonals) Level 2 (Red)  Shoulder Exercises: Pulleys   Flexion 2 minutes    ABduction 2 minutes      Manual Therapy   Soft tissue mobilization to incision regions    Myofascial Release left axillary region and upper arm and  especially to large cord near elbow    Passive ROM left shoulder flex, abd, IR and ER and D2 flex with VC's to relax                       PT Long Term Goals - 02/16/21 0829      PT LONG TERM GOAL #1   Title Patient will demonstrate she has regained full shoulder ROM and function post operatively compared to baselines.    Time 6    Period Weeks    Status On-going      PT LONG TERM GOAL #2   Title Patient will increase left shoulder active  flexion to >/= 135 degrees for increased ease reaching.    Period Weeks    Status Achieved      PT LONG TERM GOAL #3   Title Patient will increase left shoulder active abduction to >/= 165 degrees for increased ease reaching.    Time 6    Period Weeks    Status Achieved      PT LONG TERM GOAL #4   Title Patient will improve her DASH score to be </= 20 for improved overall upper extremity function.    Time 6    Period Weeks    Status On-going      PT LONG TERM GOAL #5   Title Patient will report >/= 50% decrease in pain for increased tolerance of daily tasks.    Baseline 60%    Time 6    Period Weeks    Status Achieved                 Plan - 02/16/21 0855    Clinical Impression Statement Pt has achieved goal set for decreased pain and for shoulder abduction greater than 165 degrees.  There were multiple new cords felt today at elbow. Due to lateness did not get to review self MLD today.  She was able to progress supine stabs to red band and used good form.    Rehab Potential Excellent    PT Frequency 2x / week    PT Duration 6 weeks    PT Treatment/Interventions ADLs/Self Care Home Management;Therapeutic exercise;Patient/family education;Passive range of motion;Manual lymph drainage;Manual techniques;Scar mobilization    PT Next Visit Plan MLD left breast and instruct pt,Cont MFR to cording especially large cord at medial elbow, PROM left shoulder, STM prn to med,lateral arm/pecs, started radiation ,    PT Home Exercise Plan Post op shoulder ROM HEP, desensitization techniques, compression bra and sleeve, supine scap. series.    Consulted and Agree with Plan of Care Patient           Patient will benefit from skilled therapeutic intervention in order to improve the following deficits and impairments:  Postural dysfunction,Decreased range of motion,Impaired UE functional use,Pain,Decreased knowledge of precautions,Decreased scar mobility,Increased fascial  restricitons,Increased edema,Impaired flexibility  Visit Diagnosis: Stiffness of left shoulder, not elsewhere classified  Aftercare following surgery for neoplasm  Abnormal posture  Malignant neoplasm of upper-outer quadrant of left breast in female, estrogen receptor positive ([place])  Localized edema     Problem List Patient Active Problem List   Diagnosis Date Noted  . Lymphedema of left arm 02/02/2021  .  Drug-induced neutropenia ([place]) 09/30/2020  . Genetic testing 06/26/2020  . Family history of ovarian cancer   . Family history of prostate cancer   . Malignant neoplasm of upper-outer quadrant of left breast in female, estrogen receptor positive ([place]) 06/03/2020    Claris Pong 02/16/2021, 9:30 AM  [place], [place], [address] Phone: [phone]   Fax:  [phone]  Name: LEALER MARSLAND MRN: [id number] Date of Birth: 1986/09/01 Cheral Almas, PT 02/16/21 9:31 AM

## 2021-02-17 ENCOUNTER — Ambulatory Visit
Admission: RE | Admit: 2021-02-17 | Discharge: 2021-02-17 | Disposition: A | Discharge status: Home / Self Care | Payer: 59 | Source: Ambulatory Visit | Attending: Radiation Oncology | Admitting: Radiation Oncology

## 2021-02-17 ENCOUNTER — Ambulatory Visit: Payer: 59

## 2021-02-17 ENCOUNTER — Encounter: Payer: Self-pay | Admitting: Radiation Oncology

## 2021-02-17 ENCOUNTER — Other Ambulatory Visit (HOSPITAL_COMMUNITY): Payer: Self-pay

## 2021-02-17 DIAGNOSIS — C50412 Malignant neoplasm of upper-outer quadrant of left female breast: Secondary | ICD-10-CM | POA: Diagnosis not present

## 2021-02-18 ENCOUNTER — Ambulatory Visit
Admission: RE | Admit: 2021-02-18 | Discharge: 2021-02-18 | Disposition: A | Discharge status: Home / Self Care | Payer: 59 | Source: Ambulatory Visit | Attending: Radiation Oncology | Admitting: Radiation Oncology

## 2021-02-18 ENCOUNTER — Other Ambulatory Visit: Payer: Self-pay

## 2021-02-18 ENCOUNTER — Other Ambulatory Visit (HOSPITAL_COMMUNITY): Payer: Self-pay

## 2021-02-18 DIAGNOSIS — C50412 Malignant neoplasm of upper-outer quadrant of left female breast: Secondary | ICD-10-CM | POA: Diagnosis not present

## 2021-02-19 ENCOUNTER — Ambulatory Visit
Admission: RE | Admit: 2021-02-19 | Discharge: 2021-02-19 | Disposition: A | Discharge status: Home / Self Care | Payer: 59 | Source: Ambulatory Visit | Attending: Radiation Oncology | Admitting: Radiation Oncology

## 2021-02-19 ENCOUNTER — Other Ambulatory Visit: Payer: Self-pay

## 2021-02-19 ENCOUNTER — Ambulatory Visit: Payer: 59

## 2021-02-19 DIAGNOSIS — R600 Localized edema: Secondary | ICD-10-CM

## 2021-02-19 DIAGNOSIS — C50412 Malignant neoplasm of upper-outer quadrant of left female breast: Secondary | ICD-10-CM | POA: Diagnosis not present

## 2021-02-19 DIAGNOSIS — Z483 Aftercare following surgery for neoplasm: Secondary | ICD-10-CM

## 2021-02-19 DIAGNOSIS — M25612 Stiffness of left shoulder, not elsewhere classified: Secondary | ICD-10-CM | POA: Diagnosis not present

## 2021-02-19 DIAGNOSIS — R293 Abnormal posture: Secondary | ICD-10-CM

## 2021-02-19 DIAGNOSIS — Z170 Estrogen receptor positive status [ER+]: Secondary | ICD-10-CM

## 2021-02-19 NOTE — Therapy (Signed)
[place], [place], [address] Phone: [phone]   Fax:  [phone]  Physical Therapy Treatment  Patient Details  Name: Jeanne Evans MRN: [id number] Date of Birth: Jun 15, 1986 Referring Provider (PT): Dr. Erroll Luna   Encounter Date: 02/19/2021   PT End of Session - 02/19/21 1447    Visit Number 19    Number of Visits 22    Date for PT Re-Evaluation 02/26/21    PT Start Time 2482    PT Stop Time 1457    PT Time Calculation (min) 45 min    Activity Tolerance Patient tolerated treatment well    Behavior During Therapy [facility] for tasks assessed/performed           Past Medical History:  Diagnosis Date  . Breast cancer ([place])   . Family history of ovarian cancer   . Family history of prostate cancer   . GERD (gastroesophageal reflux disease)   . History of asthma    has albuterol, uses PRN  . History of heart murmur in childhood   . History of multiple allergies     Past Surgical History:  Procedure Laterality Date  . BREAST LUMPECTOMY WITH RADIOACTIVE SEED AND SENTINEL LYMPH NODE BIOPSY Left 11/19/2020   Procedure: LEFT BREAST LUMPECTOMY X 2 WITH RADIOACTIVE SEED AND SENTINEL LYMPH NODE MAPPING;  Surgeon: Erroll Luna, MD;  Location: [facility];  Service: General;  Laterality: Left;  PECTORAL BLOCK  . PORTACATH PLACEMENT Right 06/18/2020   Procedure: INSERTION PORT-A-CATH WITH ULTRASOUND GUIDANCE;  Surgeon: Erroll Luna, MD;  Location: [place];  Service: General;  Laterality: Right;  . RE-EXCISION OF BREAST LUMPECTOMY Left 12/09/2020   Procedure: RE-EXCISION OF LEFT BREAST LUMPECTOMY;  Surgeon: Erroll Luna, MD;  Location: [place] [facility];  Service: General;  Laterality: Left;    There were no vitals filed for this visit.   Subjective Assessment - 02/19/21 1412    Subjective I went to the bra place today because the compression bra was  irritating my skin so she gave me a compression bra.  It feels so much better.   I go back to work on Monday. It was written with no restrictions but the NP at my job put some restrictions on me working 4 hours to start then increasing to 6, then 8 hours. No present pain, but definitely soreness under my arm where the bra rubbed.    Pertinent History Patient was diagnosed on 05/12/2020 with left breast cancer. She underwent neoadjuvant chemotherapy 06/19/2020-09/23/2020 and had a left lumpectomy and sentinel node biopsy (6 negative nodes) on 11/19/2020.  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    Patient Stated Goals Decrease pain and improve shoulder ROM    Currently in Pain? No/denies    Pain Score 0-No pain                             [facility] Adult PT Treatment/Exercise - 02/19/21 0001      Shoulder Exercises: Supine   Horizontal ABduction Strengthening;Both;15 reps    Theraband Level (Shoulder Horizontal ABduction) Level 1 (Yellow)    External Rotation Strengthening;Both;10 reps    Theraband Level (Shoulder External Rotation) Level 1 (Yellow)    Diagonals Strengthening;Right;Left;10 reps    Theraband Level (Shoulder Diagonals) Level 1 (Yellow)      Manual Therapy   Soft tissue mobilization held secondary to radiation  Myofascial Release left axillary region and upper arm and  especially to large cord near elbow    Manual Lymphatic Drainage (MLD) held secondary to radiation    Passive ROM left shoulder flex, abd, IR and ER and D2 flex with VC's to relax                       PT Long Term Goals - 02/16/21 0829      PT LONG TERM GOAL #1   Title Patient will demonstrate she has regained full shoulder ROM and function post operatively compared to baselines.    Time 6    Period Weeks    Status On-going      PT LONG TERM GOAL #2   Title Patient will increase left shoulder active flexion to >/= 135 degrees for increased ease reaching.    Period  Weeks    Status Achieved      PT LONG TERM GOAL #3   Title Patient will increase left shoulder active abduction to >/= 165 degrees for increased ease reaching.    Time 6    Period Weeks    Status Achieved      PT LONG TERM GOAL #4   Title Patient will improve her DASH score to be </= 20 for improved overall upper extremity function.    Time 6    Period Weeks    Status On-going      PT LONG TERM GOAL #5   Title Patient will report >/= 50% decrease in pain for increased tolerance of daily tasks.    Baseline 60%    Time 6    Period Weeks    Status Achieved                 Plan - 02/19/21 1447    Clinical Impression Statement Held MLD  and scar mobilization secondary to radiation discomfort.  Continued MFR to areas of cording and PROM of left shoulder. Pt had good tolerance for PROM and PROM is improving.  Pt is feeling better with radiation bra.    Stability/Clinical Decision Making Stable/Uncomplicated    Rehab Potential Excellent    PT Frequency 2x / week    PT Duration 6 weeks    PT Treatment/Interventions ADLs/Self Care Home Management;Therapeutic exercise;Patient/family education;Passive range of motion;Manual lymph drainage;Manual techniques;Scar mobilization    PT Next Visit Plan hold MLD while breast is tender from radiation, Cont MFR to cording, PROM left shoulder,    PT Home Exercise Plan Post op shoulder ROM HEP, desensitization techniques, compression bra (now radiation bra) and sleeve, supine scap. series.    Consulted and Agree with Plan of Care Patient           Patient will benefit from skilled therapeutic intervention in order to improve the following deficits and impairments:  Postural dysfunction,Decreased range of motion,Impaired UE functional use,Pain,Decreased knowledge of precautions,Decreased scar mobility,Increased fascial restricitons,Increased edema,Impaired flexibility  Visit Diagnosis: Stiffness of left shoulder, not elsewhere  classified  Aftercare following surgery for neoplasm  Abnormal posture  Malignant neoplasm of upper-outer quadrant of left breast in female, estrogen receptor positive ([place])  Localized edema     Problem List Patient Active Problem List   Diagnosis Date Noted  . Lymphedema of left arm 02/02/2021  . Drug-induced neutropenia ([place]) 09/30/2020  . Genetic testing 06/26/2020  . Family history of ovarian cancer   . Family history of prostate cancer   . Malignant neoplasm of upper-outer quadrant of  left breast in female, estrogen receptor positive ([place]) 06/03/2020    Claris Pong 02/19/2021, 2:55 PM  [place], [place], [address] Phone: [phone]   Fax:  [phone]  Name: LORETA BLOUCH MRN: [id number] Date of Birth: [birth year]  Cheral Almas, PT 02/19/21 2:56 PM

## 2021-02-20 ENCOUNTER — Ambulatory Visit
Admission: RE | Admit: 2021-02-20 | Discharge: 2021-02-20 | Disposition: A | Discharge status: Home / Self Care | Payer: 59 | Source: Ambulatory Visit | Attending: Radiation Oncology | Admitting: Radiation Oncology

## 2021-02-20 DIAGNOSIS — C50412 Malignant neoplasm of upper-outer quadrant of left female breast: Secondary | ICD-10-CM | POA: Diagnosis not present

## 2021-02-20 DIAGNOSIS — Z170 Estrogen receptor positive status [ER+]: Secondary | ICD-10-CM

## 2021-02-20 MED ORDER — RADIAPLEXRX EX GEL: Freq: Once | CUTANEOUS | Status: AC

## 2021-02-23 ENCOUNTER — Ambulatory Visit
Admission: RE | Admit: 2021-02-23 | Discharge: 2021-02-23 | Disposition: A | Discharge status: Home / Self Care | Payer: 59 | Source: Ambulatory Visit | Attending: Radiation Oncology | Admitting: Radiation Oncology

## 2021-02-23 ENCOUNTER — Other Ambulatory Visit: Payer: Self-pay

## 2021-02-23 ENCOUNTER — Other Ambulatory Visit (HOSPITAL_COMMUNITY): Payer: Self-pay

## 2021-02-23 ENCOUNTER — Ambulatory Visit: Payer: 59

## 2021-02-23 DIAGNOSIS — R600 Localized edema: Secondary | ICD-10-CM

## 2021-02-23 DIAGNOSIS — M25612 Stiffness of left shoulder, not elsewhere classified: Secondary | ICD-10-CM | POA: Diagnosis not present

## 2021-02-23 DIAGNOSIS — C50412 Malignant neoplasm of upper-outer quadrant of left female breast: Secondary | ICD-10-CM | POA: Diagnosis not present

## 2021-02-23 DIAGNOSIS — Z170 Estrogen receptor positive status [ER+]: Secondary | ICD-10-CM

## 2021-02-23 DIAGNOSIS — Z483 Aftercare following surgery for neoplasm: Secondary | ICD-10-CM

## 2021-02-23 DIAGNOSIS — R293 Abnormal posture: Secondary | ICD-10-CM

## 2021-02-23 NOTE — Therapy (Signed)
[place], [place], [address] Phone: [phone]   Fax:  [phone]  Physical Therapy Treatment  Patient Details  Name: Jeanne Evans MRN: [id number] Date of Birth: 1986/05/24 Referring Provider (PT): Dr. Erroll Luna   Encounter Date: 02/23/2021   PT End of Session - 02/23/21 1037    Visit Number 20    Number of Visits 22    Date for PT Re-Evaluation 02/26/21    PT Start Time 1006    PT Stop Time 1055    PT Time Calculation (min) 49 min    Activity Tolerance Patient tolerated treatment well    Behavior During Therapy [facility] for tasks assessed/performed           Past Medical History:  Diagnosis Date  . Breast cancer ([place])   . Family history of ovarian cancer   . Family history of prostate cancer   . GERD (gastroesophageal reflux disease)   . History of asthma    has albuterol, uses PRN  . History of heart murmur in childhood   . History of multiple allergies     Past Surgical History:  Procedure Laterality Date  . BREAST LUMPECTOMY WITH RADIOACTIVE SEED AND SENTINEL LYMPH NODE BIOPSY Left 11/19/2020   Procedure: LEFT BREAST LUMPECTOMY X 2 WITH RADIOACTIVE SEED AND SENTINEL LYMPH NODE MAPPING;  Surgeon: Erroll Luna, MD;  Location: [place];  Service: General;  Laterality: Left;  PECTORAL BLOCK  . PORTACATH PLACEMENT Right 06/18/2020   Procedure: INSERTION PORT-A-CATH WITH ULTRASOUND GUIDANCE;  Surgeon: Erroll Luna, MD;  Location: [facility];  Service: General;  Laterality: Right;  . RE-EXCISION OF BREAST LUMPECTOMY Left 12/09/2020   Procedure: RE-EXCISION OF LEFT BREAST LUMPECTOMY;  Surgeon: Erroll Luna, MD;  Location: [place];  Service: General;  Laterality: Left;    There were no vitals filed for this visit.   Subjective Assessment - 02/23/21 1007    Subjective I started work today 5-9 am and then went to radiation. I am  starting to get itchy from the raiation but I am using the cream.  Maybe my left breast feels a little heavier on that side, but not much.  It feels better to go without a bra when I am at home. Not sleeping well at night despite taking gabapentin.    Pertinent History Patient was diagnosed on 05/12/2020 with left breast cancer. She underwent neoadjuvant chemotherapy 06/19/2020-09/23/2020 and had a left lumpectomy and sentinel node biopsy (6 negative nodes) on 11/19/2020.  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    Patient Stated Goals Decrease pain and improve shoulder ROM    Currently in Pain? No/denies    Pain Score 0-No pain                             [facility] Adult PT Treatment/Exercise - 02/23/21 0001      Shoulder Exercises: Supine   Horizontal ABduction Strengthening;Both;10 reps    Theraband Level (Shoulder Horizontal ABduction) Level 2 (Red)    External Rotation Strengthening;Both;10 reps    Theraband Level (Shoulder External Rotation) Level 2 (Red)    Flexion Strengthening;Both;10 reps    Theraband Level (Shoulder Flexion) Level 2 (Red)    Diagonals Strengthening;Right;Left;5 reps    Theraband Level (Shoulder Diagonals) Level 2 (Red)    Other Supine Exercises AROM flex, scaption, horizontal abd x 5 with 1# wt  Shoulder Exercises: Standing   Extension Strengthening;Both;10 reps    Theraband Level (Shoulder Extension) Level 1 (Yellow)    Retraction Both;10 reps    Theraband Level (Shoulder Retraction) Level 2 (Red)      Manual Therapy   Soft tissue mobilization held secondary to radiation    Myofascial Release left axillary region and upper arm and  especially to large cord near elbow    Manual Lymphatic Drainage (MLD) held secondary to radiation    Passive ROM left shoulder flex, abd, IR and ER and D2 flex with VC's to relax                       PT Long Term Goals - 02/16/21 0829      PT LONG TERM GOAL #1   Title Patient will  demonstrate she has regained full shoulder ROM and function post operatively compared to baselines.    Time 6    Period Weeks    Status On-going      PT LONG TERM GOAL #2   Title Patient will increase left shoulder active flexion to >/= 135 degrees for increased ease reaching.    Period Weeks    Status Achieved      PT LONG TERM GOAL #3   Title Patient will increase left shoulder active abduction to >/= 165 degrees for increased ease reaching.    Time 6    Period Weeks    Status Achieved      PT LONG TERM GOAL #4   Title Patient will improve her DASH score to be </= 20 for improved overall upper extremity function.    Time 6    Period Weeks    Status On-going      PT LONG TERM GOAL #5   Title Patient will report >/= 50% decrease in pain for increased tolerance of daily tasks.    Baseline 60%    Time 6    Period Weeks    Status Achieved                 Plan - 02/23/21 1038    Clinical Impression Statement pt continues to be uncomfortable secondary to radiation and increased itching and sensitivity so held MLD and scar mobilization.  Cording is much less prominent and ROM is improved.  She is still bothered by numbness at posterior arm .  Right arm with greater fatigue today than left   Stability/Clinical Decision Making Stable/Uncomplicated    Rehab Potential Excellent    PT Frequency 2x / week    PT Duration 6 weeks    PT Treatment/Interventions ADLs/Self Care Home Management;Therapeutic exercise;Patient/family education;Passive range of motion;Manual lymph drainage;Manual techniques;Scar mobilization    PT Next Visit Plan hold MLD while breast is tender from radiation, Cont MFR to cording, PROM left shoulder,exs , recert next   PT Home Exercise Plan Post op shoulder ROM HEP, desensitization techniques, compression bra (now radiation bra) and sleeve, supine scap. series.    Consulted and Agree with Plan of Care Patient           Patient will benefit from skilled  therapeutic intervention in order to improve the following deficits and impairments:  Postural dysfunction,Decreased range of motion,Impaired UE functional use,Pain,Decreased knowledge of precautions,Decreased scar mobility,Increased fascial restricitons,Increased edema,Impaired flexibility  Visit Diagnosis: Stiffness of left shoulder, not elsewhere classified  Aftercare following surgery for neoplasm  Abnormal posture  Malignant neoplasm of upper-outer quadrant of left breast in female, estrogen  receptor positive ([place])  Localized edema     Problem List Patient Active Problem List   Diagnosis Date Noted  . Lymphedema of left arm 02/02/2021  . Drug-induced neutropenia ([place]) 09/30/2020  . Genetic testing 06/26/2020  . Family history of ovarian cancer   . Family history of prostate cancer   . Malignant neoplasm of upper-outer quadrant of left breast in female, estrogen receptor positive ([place]) 06/03/2020    Claris Pong 02/23/2021, 10:57 AM  [place], [place], [address] Phone: [phone]   Fax:  [phone]  Name: Jeanne Evans MRN: [id number] Date of Birth: [birth year] Cheral Almas, PT 02/23/21 10:58 AM

## 2021-02-24 ENCOUNTER — Ambulatory Visit
Admission: RE | Admit: 2021-02-24 | Discharge: 2021-02-24 | Disposition: A | Discharge status: Home / Self Care | Payer: 59 | Source: Ambulatory Visit | Attending: Radiation Oncology | Admitting: Radiation Oncology

## 2021-02-24 DIAGNOSIS — C50412 Malignant neoplasm of upper-outer quadrant of left female breast: Secondary | ICD-10-CM | POA: Diagnosis not present

## 2021-02-25 ENCOUNTER — Encounter: Payer: Self-pay | Admitting: *Deleted

## 2021-02-25 ENCOUNTER — Telehealth: Payer: Self-pay | Admitting: *Deleted

## 2021-02-25 ENCOUNTER — Other Ambulatory Visit: Payer: Self-pay

## 2021-02-25 ENCOUNTER — Encounter: Payer: Self-pay | Admitting: Radiation Oncology

## 2021-02-25 ENCOUNTER — Ambulatory Visit: Payer: 59

## 2021-02-25 ENCOUNTER — Ambulatory Visit
Admission: RE | Admit: 2021-02-25 | Discharge: 2021-02-25 | Disposition: A | Discharge status: Home / Self Care | Payer: 59 | Source: Ambulatory Visit | Attending: Radiation Oncology | Admitting: Radiation Oncology

## 2021-02-25 DIAGNOSIS — M25612 Stiffness of left shoulder, not elsewhere classified: Secondary | ICD-10-CM | POA: Diagnosis not present

## 2021-02-25 DIAGNOSIS — Z483 Aftercare following surgery for neoplasm: Secondary | ICD-10-CM

## 2021-02-25 DIAGNOSIS — R293 Abnormal posture: Secondary | ICD-10-CM

## 2021-02-25 DIAGNOSIS — R600 Localized edema: Secondary | ICD-10-CM

## 2021-02-25 DIAGNOSIS — C50412 Malignant neoplasm of upper-outer quadrant of left female breast: Secondary | ICD-10-CM | POA: Diagnosis not present

## 2021-02-25 DIAGNOSIS — Z170 Estrogen receptor positive status [ER+]: Secondary | ICD-10-CM

## 2021-02-25 NOTE — Therapy (Signed)
[place], [place], [address] Phone: [phone]   Fax:  [phone]  Physical Therapy Treatment  Patient Details  Name: Jeanne Evans MRN: [id number] Date of Birth: 09/03/1986 Referring Provider (PT): Dr. Erroll Luna   Encounter Date: 02/25/2021   PT End of Session - 02/25/21 1014    Visit Number 21    Number of Visits 22    Date for PT Re-Evaluation 02/26/21    PT Start Time 1003    PT Stop Time 1053    PT Time Calculation (min) 50 min    Activity Tolerance Patient tolerated treatment well    Behavior During Therapy [facility] for tasks assessed/performed           Past Medical History:  Diagnosis Date  . Breast cancer ([place])   . Family history of ovarian cancer   . Family history of prostate cancer   . GERD (gastroesophageal reflux disease)   . History of asthma    has albuterol, uses PRN  . History of heart murmur in childhood   . History of multiple allergies     Past Surgical History:  Procedure Laterality Date  . BREAST LUMPECTOMY WITH RADIOACTIVE SEED AND SENTINEL LYMPH NODE BIOPSY Left 11/19/2020   Procedure: LEFT BREAST LUMPECTOMY X 2 WITH RADIOACTIVE SEED AND SENTINEL LYMPH NODE MAPPING;  Surgeon: Erroll Luna, MD;  Location: [facility];  Service: General;  Laterality: Left;  PECTORAL BLOCK  . PORTACATH PLACEMENT Right 06/18/2020   Procedure: INSERTION PORT-A-CATH WITH ULTRASOUND GUIDANCE;  Surgeon: Erroll Luna, MD;  Location: [facility];  Service: General;  Laterality: Right;  . RE-EXCISION OF BREAST LUMPECTOMY Left 12/09/2020   Procedure: RE-EXCISION OF LEFT BREAST LUMPECTOMY;  Surgeon: Erroll Luna, MD;  Location: [facility];  Service: General;  Laterality: Left;    There were no vitals filed for this visit.   Subjective Assessment - 02/25/21 1007    Subjective My work needs a note about my appts and radiation appts. I have  radiation until April 26.  I am really tired and my legs and feet hurt from returning to work. My skin is really irritated and itchy from radiation. My nipple is really irritated.    Pertinent History Patient was diagnosed on 05/12/2020 with left breast cancer. She underwent neoadjuvant chemotherapy 06/19/2020-09/23/2020 and had a left lumpectomy and sentinel node biopsy (6 negative nodes) on 11/19/2020.  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                             [facility] Adult PT Treatment/Exercise - 02/25/21 0001      Shoulder Exercises: Supine   Flexion Strengthening;Both;10 reps    Theraband Level (Shoulder Flexion) Level 2 (Red)    Diagonals Strengthening;Right;Left;5 reps    Theraband Level (Shoulder Diagonals) Level 2 (Red)    Other Supine Exercises AROM flex, scaption, horizontal abd x 5 with 1# wt      Shoulder Exercises: Standing   External Rotation Strengthening;Both;15 reps    Theraband Level (Shoulder External Rotation) Level 2 (Red)    Extension Strengthening;Both;15 reps    Theraband Level (Shoulder Extension) Level 2 (Red)    Retraction Strengthening;Both;15 reps    Theraband Level (Shoulder Retraction) Level 2 (Red)    Shoulder Elevation --   wall chest stretch 3 x 15 sec   Shoulder Elevation Limitations bilateral elbow  flex 3# x 15    Other Standing Exercises Jobes flex and scaption 1# x10     Manual Therapy   Soft tissue mobilization held secondary to radiation    Myofascial Release left axillary region and upper arm and  especially to large cord near elbow    Manual Lymphatic Drainage (MLD) held secondary to radiation    Passive ROM left shoulder flex, abd, IR and ER and D2 flex with VC's to relax                       PT Long Term Goals - 02/16/21 0829      PT LONG TERM GOAL #1   Title Patient will demonstrate she has regained full shoulder ROM and function post operatively compared to baselines.    Time 6     Period Weeks    Status On-going      PT LONG TERM GOAL #2   Title Patient will increase left shoulder active flexion to >/= 135 degrees for increased ease reaching.    Period Weeks    Status Achieved      PT LONG TERM GOAL #3   Title Patient will increase left shoulder active abduction to >/= 165 degrees for increased ease reaching.    Time 6    Period Weeks    Status Achieved      PT LONG TERM GOAL #4   Title Patient will improve her DASH score to be </= 20 for improved overall upper extremity function.    Time 6    Period Weeks    Status On-going      PT LONG TERM GOAL #5   Title Patient will report >/= 50% decrease in pain for increased tolerance of daily tasks.    Baseline 60%    Time 6    Period Weeks    Status Achieved                 Plan - 02/25/21 1015    Clinical Impression Statement pt continues to be uncomfortable secondary to radiation wih itching and sensitivity.  She started back to work 4 hours this week and is feeling very fatigued today.  Cording is still present in axillary region and elbow and is much less prominent.  She is still bothered by numbness in the posterior upper arm. 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    Personal Factors and Comorbidities Comorbidity 3+    Comorbidities Left breast CA fuunctionally triple neg, chemo, radiation    Stability/Clinical Decision Making Stable/Uncomplicated    Rehab Potential Excellent    PT Frequency 2x / week    PT Duration 6 weeks    PT Treatment/Interventions ADLs/Self Care Home Management;Therapeutic exercise;Patient/family education;Passive range of motion;Manual lymph drainage;Manual techniques;Scar mobilization    PT Next Visit Plan recert, hold MLD while breast is tender from radiation, Cont MFR to cording, PROM left shoulder,exs to progress strength, review precautions    PT Home Exercise Plan Post op shoulder ROM HEP, desensitization  techniques, compression bra (now radiation bra) and sleeve, supine scap. series.    Consulted and Agree with Plan of Care Patient           Patient will benefit from skilled therapeutic intervention in order to improve the following deficits and impairments:  Postural dysfunction,Decreased range of motion,Impaired UE functional use,Pain,Decreased knowledge of precautions,Decreased scar mobility,Increased fascial restricitons,Increased edema,Impaired  flexibility  Visit Diagnosis: Stiffness of left shoulder, not elsewhere classified  Aftercare following surgery for neoplasm  Abnormal posture  Localized edema  Malignant neoplasm of upper-outer quadrant of left breast in female, estrogen receptor positive ([place])     Problem List Patient Active Problem List   Diagnosis Date Noted  . Lymphedema of left arm 02/02/2021  . Drug-induced neutropenia ([place]) 09/30/2020  . Genetic testing 06/26/2020  . Family history of ovarian cancer   . Family history of prostate cancer   . Malignant neoplasm of upper-outer quadrant of left breast in female, estrogen receptor positive ([place]) 06/03/2020    Claris Pong 02/25/2021, 11:00 AM  [place], [place], [address] Phone: [phone]   Fax:  [phone]  Name: Jeanne Evans MRN: [id number] Date of Birth: 13-May-1986  Cheral Almas, PT 02/25/21 11:01 AM

## 2021-02-25 NOTE — Telephone Encounter (Signed)
RETURNED PATIENT'S PHONE CALL, SPOKE WITH PATIENT. ?

## 2021-02-26 ENCOUNTER — Ambulatory Visit
Admission: RE | Admit: 2021-02-26 | Discharge: 2021-02-26 | Disposition: A | Discharge status: Home / Self Care | Payer: 59 | Source: Ambulatory Visit | Attending: Radiation Oncology | Admitting: Radiation Oncology

## 2021-02-26 ENCOUNTER — Ambulatory Visit: Payer: 59

## 2021-02-26 DIAGNOSIS — C50412 Malignant neoplasm of upper-outer quadrant of left female breast: Secondary | ICD-10-CM | POA: Diagnosis not present

## 2021-02-27 ENCOUNTER — Ambulatory Visit
Admission: RE | Admit: 2021-02-27 | Discharge: 2021-02-27 | Disposition: A | Discharge status: Home / Self Care | Payer: 59 | Source: Ambulatory Visit | Attending: Radiation Oncology | Admitting: Radiation Oncology

## 2021-02-27 ENCOUNTER — Other Ambulatory Visit (HOSPITAL_COMMUNITY): Payer: Self-pay

## 2021-02-27 ENCOUNTER — Other Ambulatory Visit: Payer: Self-pay

## 2021-02-27 DIAGNOSIS — Z170 Estrogen receptor positive status [ER+]: Secondary | ICD-10-CM

## 2021-02-27 DIAGNOSIS — C50412 Malignant neoplasm of upper-outer quadrant of left female breast: Secondary | ICD-10-CM

## 2021-02-27 MED ORDER — SONAFINE EX EMUL
1.0000 "application " | Freq: Two times a day (BID) | CUTANEOUS | Status: DC
  Administered 2021-02-27: 1 via TOPICAL

## 2021-03-02 ENCOUNTER — Ambulatory Visit: Payer: 59

## 2021-03-02 ENCOUNTER — Ambulatory Visit
Admission: RE | Admit: 2021-03-02 | Discharge: 2021-03-02 | Disposition: A | Discharge status: Home / Self Care | Payer: 59 | Source: Ambulatory Visit | Attending: Radiation Oncology | Admitting: Radiation Oncology

## 2021-03-02 ENCOUNTER — Other Ambulatory Visit: Payer: Self-pay

## 2021-03-02 DIAGNOSIS — C50412 Malignant neoplasm of upper-outer quadrant of left female breast: Secondary | ICD-10-CM | POA: Diagnosis not present

## 2021-03-03 ENCOUNTER — Ambulatory Visit: Payer: 59

## 2021-03-03 ENCOUNTER — Encounter: Payer: Self-pay | Admitting: Physical Therapy

## 2021-03-03 ENCOUNTER — Ambulatory Visit
Admission: RE | Admit: 2021-03-03 | Discharge: 2021-03-03 | Disposition: A | Discharge status: Home / Self Care | Payer: 59 | Source: Ambulatory Visit | Attending: Radiation Oncology | Admitting: Radiation Oncology

## 2021-03-03 ENCOUNTER — Ambulatory Visit: Payer: 59 | Admitting: Physical Therapy

## 2021-03-03 DIAGNOSIS — Z483 Aftercare following surgery for neoplasm: Secondary | ICD-10-CM

## 2021-03-03 DIAGNOSIS — M25612 Stiffness of left shoulder, not elsewhere classified: Secondary | ICD-10-CM

## 2021-03-03 DIAGNOSIS — R600 Localized edema: Secondary | ICD-10-CM

## 2021-03-03 DIAGNOSIS — R293 Abnormal posture: Secondary | ICD-10-CM

## 2021-03-03 DIAGNOSIS — C50412 Malignant neoplasm of upper-outer quadrant of left female breast: Secondary | ICD-10-CM | POA: Diagnosis not present

## 2021-03-03 DIAGNOSIS — L599 Disorder of the skin and subcutaneous tissue related to radiation, unspecified: Secondary | ICD-10-CM

## 2021-03-03 NOTE — Therapy (Signed)
[place], [place], [address] Phone: [phone]   Fax:  [phone]  Physical Therapy Treatment  Patient Details  Name: Jeanne Evans MRN: [id number] Date of Birth: Oct 11, 1986 Referring Provider (PT): Dr. Erroll Luna   Encounter Date: 03/03/2021   PT End of Session - 03/03/21 1317    Visit Number 22    Number of Visits 38    Date for PT Re-Evaluation 05/03/21    PT Start Time 1108    PT Stop Time 1200    PT Time Calculation (min) 52 min    Activity Tolerance Patient tolerated treatment well    Behavior During Therapy [facility] for tasks assessed/performed           Past Medical History:  Diagnosis Date  . Breast cancer ([place])   . Family history of ovarian cancer   . Family history of prostate cancer   . GERD (gastroesophageal reflux disease)   . History of asthma    has albuterol, uses PRN  . History of heart murmur in childhood   . History of multiple allergies     Past Surgical History:  Procedure Laterality Date  . BREAST LUMPECTOMY WITH RADIOACTIVE SEED AND SENTINEL LYMPH NODE BIOPSY Left 11/19/2020   Procedure: LEFT BREAST LUMPECTOMY X 2 WITH RADIOACTIVE SEED AND SENTINEL LYMPH NODE MAPPING;  Surgeon: Erroll Luna, MD;  Location: [facility];  Service: General;  Laterality: Left;  PECTORAL BLOCK  . PORTACATH PLACEMENT Right 06/18/2020   Procedure: INSERTION PORT-A-CATH WITH ULTRASOUND GUIDANCE;  Surgeon: Erroll Luna, MD;  Location: [facility];  Service: General;  Laterality: Right;  . RE-EXCISION OF BREAST LUMPECTOMY Left 12/09/2020   Procedure: RE-EXCISION OF LEFT BREAST LUMPECTOMY;  Surgeon: Erroll Luna, MD;  Location: [facility];  Service: General;  Laterality: Left;    There were no vitals filed for this visit.   Subjective Assessment - 03/03/21 1116    Subjective Pt says she got a new cream from radiation so she does not have  as much pain in her breast today . She has also been very tired. 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    Pertinent History Patient was diagnosed on 05/12/2020 with left breast cancer. She underwent neoadjuvant chemotherapy 06/19/2020-09/23/2020 and had a left lumpectomy and sentinel node biopsy (6 negative nodes) on 11/19/2020.  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    Patient Stated Goals Decrease pain and improve shoulder ROM    Currently in Pain? No/denies   just numbness, itching  in the back of her arm             [facility] PT Assessment - 03/03/21 0001      Assessment   Medical Diagnosis s/p left lumpectomy and sentinel node biopsy    Referring Provider (PT) Dr. [name] [place]    Onset Date/Surgical Date 11/19/20      Prior Function   Level of Independence Independent      Observation/Other Assessments   Other Surveys  Quick Dash    Quick DASH  63.64      Functional Tests   Functional tests Sit to Stand      AROM   Left Shoulder Flexion 150 Degrees    Left Shoulder ABduction 115 Degrees    Left Shoulder External Rotation 85 Degrees  Katina Dung - 03/03/21 0001    Open a tight or new jar Severe difficulty    Do heavy household chores (wash walls, wash floors) Moderate difficulty    Carry a shopping bag or briefcase Mild difficulty    Wash your back Moderate difficulty    Use a knife to cut food Mild difficulty    Recreational activities in which you take some force or impact through your arm, shoulder, or hand (golf, hammering, tennis) Severe difficulty    During the past week, to what extent has your arm, shoulder or hand problem interfered with your normal social activities with family, friends, neighbors, or groups? Quite a bit    During the past week, to what extent has your arm, shoulder or hand problem limited your work or other regular daily activities Quite a bit     Arm, shoulder, or hand pain. Severe    Tingling (pins and needles) in your arm, shoulder, or hand Extreme    Difficulty Sleeping Severe difficulty    DASH Score 63.64 %                  [facility] Adult PT Treatment/Exercise - 03/03/21 0001      Manual Therapy   Manual Therapy Edema management;Manual Lymphatic Drainage (MLD);Myofascial release;Soft tissue mobilization    Edema Management provided folded over tg soft on upper arm to axilla and pt reported it really helped with the pain she was having in this area    Soft tissue mobilization outside of radiaiton field with cocoa butter to posteior shoulder and upper traps    Myofascial Release left axillary region and upper arm and  especially to large cord near elbow with pressure with no stretch to skin    Manual Lymphatic Drainage (MLD) careful to make sure skin stretch is outside of radiation field, left supraclavicular area posteriorly to back. diaphragmatic breathing , left inguinal nodes and left abdomen toward inguinal nodes, then to sidlying for posterior interaxillay anastamosis, back of arm    Passive ROM left shoulder flex, abd, IR and ER                       PT Long Term Goals - 03/03/21 1118      PT LONG TERM GOAL #1   Title Patient will demonstrate she has regained full shoulder ROM and function post operatively compared to baselines.    Baseline 4/19: pt is having more pain, stiffness and tightness at latter part of radiation    Time 8    Status On-going      PT LONG TERM GOAL #2   Title Patient will increase left shoulder active flexion to >/= 165 degrees for increased ease reaching.    Baseline 109 post/ 165 on 4/4, 150 on 03/03/2021    Time 8    Period Weeks    Status Revised      PT LONG TERM GOAL #3   Title Patient will increase left shoulder active abduction to >/= 165 degrees for increased ease reaching.    Baseline 150 at eval, 168 on 02/16/2021, 115 on 4/19 2022    Time 8    Period Weeks     Status Revised      PT LONG TERM GOAL #4   Baseline 70.45 post op; 18.18 pre-op/  36% 02/16/2021, 63.64 on 03/03/2021    Time 8    Period Weeks    Status On-going  PT LONG TERM GOAL #5   Title Patient will report >/= 50% decrease in pain for increased tolerance of daily tasks.    Baseline 60% on 02/16/2021    Time 8    Period Weeks    Status On-going      Additional Long Term Goals   Additional Long Term Goals Yes      PT LONG TERM GOAL #6   Title Pt will increase sit to stand in 30 seconds to 12 to indicate an improvement in functional strength    Baseline 7 on 03/03/2021    Time 8    Period Weeks    Status New                 Plan - 03/03/21 1334    Clinical Impression Statement Pt is struggling with skin changes and  itching  numbness, pain, swelling, decreased range of motion , increased muscle tightness in her upper quadrants as she nears the end of radiation . She has had a decline in her range of motion measurements ant the Quick DASH which measures the function of her upper quadrant   She also has fatigue and scored poorly on the 30 second sit to stand test of her functional abilities.  She will require more PT as she recovers from radiation and moves to [place] to return to her work and life activites post cancer treatment.  Recert sent for 8 more weeks today    Personal Factors and Comorbidities Comorbidity 3+    Comorbidities Left breast CA fuunctionally triple neg, chemo, radiation    Stability/Clinical Decision Making Evolving/Moderate complexity    Rehab Potential Excellent    PT Frequency 2x / week    PT Duration 8 weeks    PT Treatment/Interventions ADLs/Self Care Home Management;Therapeutic exercise;Patient/family education;Passive range of motion;Manual lymph drainage;Manual techniques;Scar mobilization    PT Next Visit Plan hold MLD while breast is tender from radiation,but consdier to non radiated areas  Cont MFR to cording and tight muscles at  posterior shoulder  PROM left shoulder,exs to progress strength, review precautions. see if tg soft was helpful    PT Home Exercise Plan Post op shoulder ROM HEP, desensitization techniques, compression bra (now radiation bra) and sleeve, supine scap. series.    Recommended Other Services has sleeve and bra now    Consulted and Agree with Plan of Care Patient           Patient will benefit from skilled therapeutic intervention in order to improve the following deficits and impairments:  Postural dysfunction,Decreased range of motion,Impaired UE functional use,Pain,Decreased knowledge of precautions,Decreased scar mobility,Increased fascial restricitons,Increased edema,Impaired flexibility  Visit Diagnosis: Stiffness of left shoulder, not elsewhere classified - Plan: PT plan of care cert/re-cert  Aftercare following surgery for neoplasm - Plan: PT plan of care cert/re-cert  Abnormal posture - Plan: PT plan of care cert/re-cert  Localized edema - Plan: PT plan of care cert/re-cert  Disorder of the skin and subcutaneous tissue related to radiation, unspecified - Plan: PT plan of care cert/re-cert     Problem List Patient Active Problem List   Diagnosis Date Noted  . Lymphedema of left arm 02/02/2021  . Drug-induced neutropenia ([place]) 09/30/2020  . Genetic testing 06/26/2020  . Family history of ovarian cancer   . Family history of prostate cancer   . Malignant neoplasm of upper-outer quadrant of left breast in female, estrogen receptor positive ([place]) 06/03/2020   Donato Heinz. Owens Shark, PT  Norwood Levo  03/03/2021, 1:48 PM  [place], [place], [address] Phone: [phone]   Fax:  [phone]  Name: Jeanne Evans MRN: [id number] Date of Birth: 03/14/1986

## 2021-03-04 ENCOUNTER — Ambulatory Visit: Payer: 59

## 2021-03-04 ENCOUNTER — Ambulatory Visit
Admission: RE | Admit: 2021-03-04 | Discharge: 2021-03-04 | Disposition: A | Discharge status: Home / Self Care | Payer: 59 | Source: Ambulatory Visit | Attending: Radiation Oncology | Admitting: Radiation Oncology

## 2021-03-04 ENCOUNTER — Other Ambulatory Visit: Payer: Self-pay

## 2021-03-04 DIAGNOSIS — C50412 Malignant neoplasm of upper-outer quadrant of left female breast: Secondary | ICD-10-CM | POA: Diagnosis not present

## 2021-03-05 ENCOUNTER — Ambulatory Visit
Admission: RE | Admit: 2021-03-05 | Discharge: 2021-03-05 | Disposition: A | Discharge status: Home / Self Care | Payer: 59 | Source: Ambulatory Visit | Attending: Radiation Oncology | Admitting: Radiation Oncology

## 2021-03-05 DIAGNOSIS — C50412 Malignant neoplasm of upper-outer quadrant of left female breast: Secondary | ICD-10-CM | POA: Diagnosis not present

## 2021-03-06 ENCOUNTER — Ambulatory Visit: Payer: 59

## 2021-03-06 ENCOUNTER — Encounter: Payer: 59 | Admitting: Physical Therapy

## 2021-03-06 ENCOUNTER — Ambulatory Visit
Admission: RE | Admit: 2021-03-06 | Discharge: 2021-03-06 | Disposition: A | Discharge status: Home / Self Care | Payer: 59 | Source: Ambulatory Visit | Attending: Radiation Oncology | Admitting: Radiation Oncology

## 2021-03-06 ENCOUNTER — Other Ambulatory Visit: Payer: Self-pay

## 2021-03-06 DIAGNOSIS — C50412 Malignant neoplasm of upper-outer quadrant of left female breast: Secondary | ICD-10-CM

## 2021-03-06 DIAGNOSIS — Z170 Estrogen receptor positive status [ER+]: Secondary | ICD-10-CM

## 2021-03-06 MED ORDER — SONAFINE EX EMUL
1.0000 "application " | Freq: Once | CUTANEOUS | Status: AC
  Administered 2021-03-06: 1 via TOPICAL

## 2021-03-08 NOTE — Progress Notes (Signed)
[place]  [phone] Fax:([phone]     ID: Jeanne Evans DOB: 16-Dec-1985  MR#: [id number]  CSN#:[id number]  Patient Care Team: [name], MD as PCP - General (Obstetrics and Gynecology) Rockwell Germany, RN as Oncology Nurse Navigator Mauro Kaufmann, RN as Oncology Nurse Navigator Erroll Luna, MD as Consulting Physician (General Surgery) Shaasia Odle, Virgie Dad, MD as Consulting Physician (Oncology) Kyung Rudd, MD as Consulting Physician (Radiation Oncology) Servando Salina, MD as Consulting Physician (Obstetrics and Gynecology) Chauncey Cruel, MD OTHER MD:  CHIEF COMPLAINT: 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   She began capecitabine on on 01/26/2021 and concurrent radiation therapy on 01/22/2021. She completes radiation therapy 03/10/2021  Jeanne Evans is taking Capecitabine 1 g BID.  She is tolerating this moderately well.  She does notice some alteration in taste and decreased appetite although she has not lost any weight.   REVIEW OF SYSTEMS: Jeanne Evans tells me they never approved Jeanne Evans for disability even temporarily and this has been extremely stressful for Jeanne Evans.  She feels very tired.  Yesterday she spent most of the day in bed.  Some days she just feels down because of all that is happening.  The skin in the left breast of course is very dark now and it is a little bit itchy but it never did peel.  She went back to work 02/23/2021 and is finding difficult to get through the day.  She tells me Jeanne Evans children are in counseling and that she could do some counseling herself.  A detailed review of systems today was otherwise stable.   COVID 19 VACCINATION STATUS: [place] x2, most recently 01/2021   HISTORY OF CURRENT ILLNESS: From the  original intake note:  Jeanne Evans herself palpated a painful left breast lump. She underwent bilateral diagnostic mammography with tomography and left breast ultrasonography at [facility] on 05/12/2020 showing: breast density category B; palpable 2.7 cm oval hypoechoic mass in left breast at 2-3 o'clock; 2.2 cm left axillary lymph node/grouping of nodes.  Accordingly on 05/29/2020 she proceeded to biopsy of the left breast area in question. The pathology from this procedure ([id number]) showed: invasive ductal carcinoma, grade 3. Prognostic indicators significant for: estrogen receptor, 10% positive with weak staining intensity and progesterone receptor, 0% negative. Proliferation marker Ki67 at 95%. HER2 equivocal by immunohistochemistry (2+), but negative by fluorescent in situ hybridization with a signals ratio 1.77 and number per cell 2.65.  Biopsy of the left axillary lymph node in question was negative and concordant  The patient's subsequent history is as detailed below.   PAST MEDICAL HISTORY: Past Medical History:  Diagnosis Date  . Breast cancer ([place])   . Family history of ovarian cancer   . Family history of prostate cancer   . GERD (gastroesophageal reflux disease)   . History of asthma    has albuterol, uses PRN  . History of heart murmur in childhood   . History of multiple allergies   History of allergy related headaches   PAST SURGICAL HISTORY: Past Surgical History:  Procedure Laterality Date  . BREAST LUMPECTOMY WITH RADIOACTIVE SEED AND SENTINEL LYMPH NODE BIOPSY Left 11/19/2020   Procedure: LEFT BREAST LUMPECTOMY X 2 WITH RADIOACTIVE SEED AND SENTINEL LYMPH NODE MAPPING;  Surgeon: Erroll Luna, MD;  Location: [place];  Service: General;  Laterality: Left;  PECTORAL BLOCK  . PORTACATH PLACEMENT Right 06/18/2020   Procedure: INSERTION PORT-A-CATH WITH ULTRASOUND GUIDANCE;  Surgeon: Erroll Luna, MD;  Location: [place];  Service: General;  Laterality: Right;  . RE-EXCISION OF BREAST LUMPECTOMY Left 12/09/2020   Procedure: RE-EXCISION OF LEFT BREAST LUMPECTOMY;  Surgeon: Erroll Luna, MD;  Location: [place];  Service: General;  Laterality: Left;  She reports having a boil lanced from Jeanne Evans perineal area   FAMILY HISTORY: Family History  Problem Relation Age of Onset  . Lung cancer Maternal Uncle        dx late 46s  . Prostate cancer Paternal Uncle        dx late 23s  . Ovarian cancer Paternal Grandmother        dx 52s  . Prostate cancer Paternal Grandfather        dx 20s  . Prostate cancer Paternal Uncle        dx early 45s  The patient's father is 30 and Jeanne Evans mother 50, as of 05/2020.  The patient has two brothers and one sister. She reports ovarian cancer in Jeanne Evans paternal grandmother, prostate cancer in Jeanne Evans paternal grandfather and a paternal uncle, and lung cancer in a maternal uncle.   GYNECOLOGIC HISTORY:  No LMP recorded. (Menstrual status: IUD). Menarche: 35 years old Age at first live birth: 35 years old [place] P 2 LMP current, experiences spotting Contraceptive: Mirena IUD in place HRT n/a  Hysterectomy? no BSO? 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 She describes herself as single. She lives at home with Jeanne Evans children, Jeanne Evans (age 19) and Jeanne Evans (age 19).  She is originally from [place] Jeanne Evans home.    ADVANCED DIRECTIVES: Not in place. She intends to name Jeanne Evans mother, Jeanne Evans, as Jeanne Evans HCPOA.  Jeanne Evans lives in [place] and can be reached at [phone].  The patient was given the appropriate documents to complete and notarized at Jeanne Evans discretion at the time of Jeanne Evans visit 06/04/2020   HEALTH MAINTENANCE: Social History   Tobacco Use  . Smoking status: Never Smoker  . Smokeless tobacco: Never Used  Substance Use Topics   . Alcohol use: Yes    Comment: occas  . Drug use: Yes    Types: Marijuana     Colonoscopy: n/a (age)  PAP: 04/2020  Bone density: n/a (age)   Allergies  Allergen Reactions  . Sulfa Antibiotics Hives    Current Outpatient Medications  Medication Sig Dispense Refill  . Acetaminophen (TYLENOL) 325 MG CAPS Take by mouth.    . capecitabine (XELODA) 500 MG tablet TAKE 2 TABLETS (1,000 MG TOTAL) BY MOUTH 2 (TWO) TIMES DAILY AFTER A MEAL. TAKE ONLY ON RADIATION DAYS. START MONDAY 01/26/2021 45 tablet 0  . gabapentin (NEURONTIN) 300 MG capsule Take 1 capsule (300 mg total) by mouth at bedtime. 90 capsule 4  . ibuprofen (ADVIL) 800 MG tablet Take 1 tablet (800 mg total) by mouth every 8 (eight) hours as needed. 30 tablet 0  . levonorgestrel (MIRENA) 20 MCG/24HR IUD 1 each by Intrauterine route once.    . magic mouthwash SOLN Take 5 mLs by mouth 4 (four) times daily as needed for mouth pain. 240 mL 1  . naproxen sodium (ALEVE) 220 MG tablet Take 220 mg by mouth. (Patient not taking: Reported on 02/12/2021)    . omeprazole (PRILOSEC)  40 MG capsule Take 1 capsule (40 mg total) by mouth at bedtime. 30 capsule 3  . venlafaxine XR (EFFEXOR-XR) 75 MG 24 hr capsule Take 1 capsule (75 mg total) by mouth daily with breakfast. 90 capsule 4   No current facility-administered medications for this visit.    OBJECTIVE: African-American Evans who appears stated age 35:   03/09/21 1020  BP: 137/87  Pulse: 88  Resp: 16  Temp: 97.9 F (36.6 C)  SpO2: 100%     Body mass index is 32.51 kg/m.   Wt Readings from Last 3 Encounters:  03/09/21 183 lb 8 oz (83.2 kg)  02/12/21 183 lb 12.8 oz (83.4 kg)  02/02/21 184 lb 12.8 oz (83.8 kg)  ECOG FS:1 - Symptomatic but completely ambulatory  Sclerae unicteric, EOMs intact Wearing a mask No cervical or supraclavicular adenopathy Lungs no rales or rhonchi Heart regular rate and rhythm Abd soft, nontender, positive bowel sounds MSK no focal spinal  tenderness, no upper extremity lymphedema Neuro: nonfocal, well oriented, appropriate affect Breasts: Left breast is status postlumpectomy and radiation.  There is hyperpigmentation but no desquamation.  The cosmetic result is very good.  The right breast and both axillae are benign.   LAB RESULTS:  CMP     Component Value Date/Time   NA 139 03/09/2021 0941   K 4.1 03/09/2021 0941   CL 103 03/09/2021 0941   CO2 26 03/09/2021 0941   GLUCOSE 95 03/09/2021 0941   BUN 10 03/09/2021 0941   CREATININE 0.90 03/09/2021 0941   CREATININE 0.92 06/04/2020 0830   CALCIUM 9.4 03/09/2021 0941   PROT 8.0 03/09/2021 0941   ALBUMIN 4.3 03/09/2021 0941   AST 27 03/09/2021 0941   AST 13 (L) 06/04/2020 0830   ALT 28 03/09/2021 0941   ALT 14 06/04/2020 0830   ALKPHOS 79 03/09/2021 0941   BILITOT 1.0 03/09/2021 0941   BILITOT 0.5 06/04/2020 0830   GFRNONAA >60 03/09/2021 0941   GFRNONAA >60 06/04/2020 0830   GFRAA >60 08/05/2020 0955   GFRAA >60 06/04/2020 0830    No results found for: TOTALPROTELP, ALBUMINELP, A1GS, A2GS, BETS, BETA2SER, GAMS, MSPIKE, SPEI  Lab Results  Component Value Date   WBC 3.4 (L) 03/09/2021   NEUTROABS 1.9 03/09/2021   HGB 12.6 03/09/2021   HCT 37.8 03/09/2021   MCV 85.5 03/09/2021   PLT 261 03/09/2021    No results found for: LABCA2  No components found for: XLKGMW102  No results for input(s): INR in the last 168 hours.  No results found for: LABCA2  No results found for: VOZ366  No results found for: YQI347  No results found for: QQV956  No results found for: CA2729  No components found for: HGQUANT  No results found for: CEA1 / No results found for: CEA1   No results found for: AFPTUMOR  No results found for: CHROMOGRNA  No results found for: KPAFRELGTCHN, LAMBDASER, KAPLAMBRATIO (kappa/lambda light chains)  No results found for: HGBA, HGBA2QUANT, HGBFQUANT, HGBSQUAN (Hemoglobinopathy evaluation)   No results found for: LDH  No  results found for: IRON, TIBC, IRONPCTSAT (Iron and TIBC)  No results found for: FERRITIN  Urinalysis No results found for: COLORURINE, APPEARANCEUR, LABSPEC, PHURINE, GLUCOSEU, HGBUR, BILIRUBINUR, KETONESUR, PROTEINUR, UROBILINOGEN, NITRITE, LEUKOCYTESUR   STUDIES: No results found.   ELIGIBLE FOR AVAILABLE RESEARCH PROTOCOL: no  ASSESSMENT: 35 y.o. Jeanne Evans status post left breast upper outer quadrant biopsy 05/29/2020 for a clinical T2 N0, stage IIB invasive ductal carcinoma, functionally triple  negative, with an MIB-1 of 95%.  (1) genetics testing 06/23/2020 through the [facility] Multi-Cancer Panel found no deleterious mutations in AIP, ALK, APC, ATM, AXIN2,BAP1,  BARD1, BLM, BMPR1A, BRCA1, BRCA2, BRIP1, CASR, CDC73, CDH1, CDK4, CDKN1B, CDKN1C, CDKN2A (p14ARF), CDKN2A (p16INK4a), CEBPA, CHEK2, CTNNA1, DICER1, DIS3L2, EGFR (c.2369C>T, p.Thr790Met variant only), EPCAM (Deletion/duplication testing only), FH, FLCN, GATA2, GPC3, GREM1 (Promoter region deletion/duplication testing only), HOXB13 (c.251G>A, p.Gly84Glu), HRAS, KIT, MAX, MEN1, MET, MITF (c.952G>A, p.Glu318Lys variant only), MLH1, MSH2, MSH3, MSH6, MUTYH, NBN, NF1, NF2, NTHL1, PALB2, PDGFRA, PHOX2B, PMS2, POLD1, POLE, POT1, PRKAR1A, PTCH1, PTEN, RAD50, RAD51C, RAD51D, RB1, RECQL4, RET, RNF43, RUNX1, SDHAF2, SDHA (sequence changes only), SDHB, SDHC, SDHD, SMAD4, SMARCA4, SMARCB1, SMARCE1, STK11, SUFU, TERC, TERT, TMEM127, TP53, TSC1, TSC2, VHL, WRN and WT1.   (2) neoadjuvant chemotherapy consisting of cyclophosphamide and doxorubicin in dose dense fashion x4 started 06/19/2020, completed 08/05/2020, followed by paclitaxel and carboplatin weekly x12 starting 08/26/2020, completing 4 doses 09/23/2020  (a) echocardiogram on 06/13/2020 showed an EF of 60-65%  (b) paclitaxel and carboplatin discontinued after 4 doses because of neuropathy and cytopenias  (3) status post left lumpectomy and sentinel lymph node sampling 11/19/2020 for  a residual yp T1c ypN0 invasive ductal carcinoma involving the superior margin  (a) additional surgery 12/09/2020 cleared the margin in question  (b) repeat prognostic panel requested 03/09/2021  (4) postmastectomy radiation started 01/22/2021:   (a) receiving capecitabine sensitization, started 01/26/2021  (5) to start pembrolizumab/Keytruda after the completion of radiation   PLAN: Jeanne Evans will complete Jeanne Evans radiation treatments tomorrow.  She will take Jeanne Evans capecitabine out that day and then stop.  We then need to decide if we are going to do 4 more months of capecitabine at full dose or switch to pembrolizumab/Keytruda.  I have requested a repeat prognostic panel on Jeanne Evans tumor and if it is triple negative now which I expect is the case we will go with [facility] since she has not tolerated the lower dose of capecitabine very well.  I think she would be a good candidate for the finding of new normal and I am letting our social workers become aware of that.  She never got to "ring the bell".  Hopefully she can get that done tomorrow when she finishes radiation.  She will see me in 1 month.  At that time we will likely set Jeanne Evans up to start pembrolizumab  Total encounter time 35 minutes.Sarajane Jews C. Eulene Pekar, MD 03/09/21 10:37 AM Medical Oncology and Hematology [facility] [place], [place] [address] Tel. [phone]    Fax. [phone]   I, Wilburn Mylar, am acting as scribe for Dr. Virgie Dad. Jeanne Evans.  I, Lurline Del MD, have reviewed the above documentation for accuracy and completeness, and I agree with the above.    *Total Encounter Time as defined by the Centers for Medicare and Medicaid Services includes, in addition to the face-to-face time of a patient visit (documented in the note above) non-face-to-face time: obtaining and reviewing outside history, ordering and reviewing medications, tests or procedures, care coordination  (communications with other health care professionals or caregivers) and documentation in the medical record.

## 2021-03-09 ENCOUNTER — Ambulatory Visit: Payer: 59

## 2021-03-09 ENCOUNTER — Other Ambulatory Visit: Payer: Self-pay | Admitting: Oncology

## 2021-03-09 ENCOUNTER — Inpatient Hospital Stay (HOSPITAL_BASED_OUTPATIENT_CLINIC_OR_DEPARTMENT_OTHER): Payer: 59 | Admitting: Oncology

## 2021-03-09 ENCOUNTER — Other Ambulatory Visit: Payer: Self-pay

## 2021-03-09 ENCOUNTER — Inpatient Hospital Stay: Payer: 59

## 2021-03-09 ENCOUNTER — Ambulatory Visit
Admission: RE | Admit: 2021-03-09 | Discharge: 2021-03-09 | Disposition: A | Discharge status: Home / Self Care | Payer: 59 | Source: Ambulatory Visit | Attending: Radiation Oncology | Admitting: Radiation Oncology

## 2021-03-09 VITALS — BP 137/87 | HR 88 | Temp 97.9°F | Resp 16 | Ht 63.0 in | Wt 183.5 lb

## 2021-03-09 DIAGNOSIS — Z170 Estrogen receptor positive status [ER+]: Secondary | ICD-10-CM | POA: Diagnosis not present

## 2021-03-09 DIAGNOSIS — C50412 Malignant neoplasm of upper-outer quadrant of left female breast: Secondary | ICD-10-CM | POA: Diagnosis not present

## 2021-03-09 DIAGNOSIS — Z452 Encounter for adjustment and management of vascular access device: Secondary | ICD-10-CM | POA: Diagnosis not present

## 2021-03-09 DIAGNOSIS — R293 Abnormal posture: Secondary | ICD-10-CM

## 2021-03-09 DIAGNOSIS — Z483 Aftercare following surgery for neoplasm: Secondary | ICD-10-CM

## 2021-03-09 DIAGNOSIS — R600 Localized edema: Secondary | ICD-10-CM

## 2021-03-09 DIAGNOSIS — M25612 Stiffness of left shoulder, not elsewhere classified: Secondary | ICD-10-CM | POA: Diagnosis not present

## 2021-03-09 DIAGNOSIS — L599 Disorder of the skin and subcutaneous tissue related to radiation, unspecified: Secondary | ICD-10-CM

## 2021-03-09 LAB — COMPREHENSIVE METABOLIC PANEL
ALT: 28 U/L (ref 0–44)
AST: 27 U/L (ref 15–41)
Albumin: 4.3 g/dL (ref 3.5–5.0)
Alkaline Phosphatase: 79 U/L (ref 38–126)
Anion gap: 10 (ref 5–15)
BUN: 10 mg/dL (ref 6–20)
CO2: 26 mmol/L (ref 22–32)
Calcium: 9.4 mg/dL (ref 8.9–10.3)
Chloride: 103 mmol/L (ref 98–111)
Creatinine, Ser: 0.9 mg/dL (ref 0.44–1.00)
GFR, Estimated: >60 mL/min (ref >60)
Glucose, Bld: 95 mg/dL (ref 70–99)
Potassium: 4.1 mmol/L (ref 3.5–5.1)
Sodium: 139 mmol/L (ref 135–145)
Total Bilirubin: 1 mg/dL (ref 0.3–1.2)
Total Protein: 8 g/dL (ref 6.5–8.1)

## 2021-03-09 LAB — CBC WITH DIFFERENTIAL/PLATELET
Abs Immature Granulocytes: 0 10*3/uL (ref 0.00–0.07)
Basophils Absolute: 0 10*3/uL (ref 0.0–0.1)
Basophils Relative: 0 %
Eosinophils Absolute: 0.3 10*3/uL (ref 0.0–0.5)
Eosinophils Relative: 8 %
HCT: 37.8 % (ref 36.0–46.0)
Hemoglobin: 12.6 g/dL (ref 12.0–15.0)
Immature Granulocytes: 0 %
Lymphocytes Relative: 24 %
Lymphs Abs: 0.8 10*3/uL (ref 0.7–4.0)
MCH: 28.5 pg (ref 26.0–34.0)
MCHC: 33.3 g/dL (ref 30.0–36.0)
MCV: 85.5 fL (ref 80.0–100.0)
Monocytes Absolute: 0.4 10*3/uL (ref 0.1–1.0)
Monocytes Relative: 11 %
Neutro Abs: 1.9 10*3/uL (ref 1.7–7.7)
Neutrophils Relative %: 57 %
Platelets: 261 10*3/uL (ref 150–400)
RBC: 4.42 MIL/uL (ref 3.87–5.11)
RDW: 18.5 % — ABNORMAL HIGH (ref 11.5–15.5)
WBC: 3.4 10*3/uL — ABNORMAL LOW (ref 4.0–10.5)
nRBC: 0 % (ref 0.0–0.2)

## 2021-03-09 MED ORDER — LIDOCAINE-PRILOCAINE 2.5-2.5 % EX CREA: 1.0000 "application " | TOPICAL_CREAM | CUTANEOUS | 0 refills | Status: DC | PRN

## 2021-03-09 NOTE — Therapy (Signed)
[place], [place], [address] Phone: [phone]   Fax:  [phone]  Physical Therapy Treatment  Patient Details  Name: Jeanne Evans MRN: [id number] Date of Birth: 06/05/1986 Referring Provider (PT): Dr. Erroll Luna   Encounter Date: 03/09/2021   PT End of Session - 03/09/21 1726    Visit Number 23    Number of Visits 38    Date for PT Re-Evaluation 05/03/21    PT Start Time 0410    PT Stop Time 0505    PT Time Calculation (min) 55 min    Activity Tolerance Patient tolerated treatment well    Behavior During Therapy [facility] for tasks assessed/performed           Past Medical History:  Diagnosis Date  . Breast cancer ([place])   . Family history of ovarian cancer   . Family history of prostate cancer   . GERD (gastroesophageal reflux disease)   . History of asthma    has albuterol, uses PRN  . History of heart murmur in childhood   . History of multiple allergies     Past Surgical History:  Procedure Laterality Date  . BREAST LUMPECTOMY WITH RADIOACTIVE SEED AND SENTINEL LYMPH NODE BIOPSY Left 11/19/2020   Procedure: LEFT BREAST LUMPECTOMY X 2 WITH RADIOACTIVE SEED AND SENTINEL LYMPH NODE MAPPING;  Surgeon: Erroll Luna, MD;  Location: [place];  Service: General;  Laterality: Left;  PECTORAL BLOCK  . PORTACATH PLACEMENT Right 06/18/2020   Procedure: INSERTION PORT-A-CATH WITH ULTRASOUND GUIDANCE;  Surgeon: Erroll Luna, MD;  Location: [place];  Service: General;  Laterality: Right;  . RE-EXCISION OF BREAST LUMPECTOMY Left 12/09/2020   Procedure: RE-EXCISION OF LEFT BREAST LUMPECTOMY;  Surgeon: Erroll Luna, MD;  Location: [place];  Service: General;  Laterality: Left;    There were no vitals filed for this visit.   Subjective Assessment - 03/09/21 1725    Subjective Still working only 4 hours but I am so tired . I Have my last  radiation tomorrow.  I wish we could ring the bell. The sleeve she cut for my upper arm helped with the pain, but I wear my compression sleeve to work.    Pertinent History Patient was diagnosed on 05/12/2020 with left breast cancer. She underwent neoadjuvant chemotherapy 06/19/2020-09/23/2020 and had a left lumpectomy and sentinel node biopsy (6 negative nodes) on 11/19/2020.  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    Patient Stated Goals Decrease pain and improve shoulder ROM    Currently in Pain? No/denies                             [facility] Adult PT Treatment/Exercise - 03/09/21 0001      Manual Therapy   Soft tissue mobilization outside of radiation field with cocoa butter to pecs,posterior shoulder, scapular area and upper traps and posterior upper arm    Myofascial Release left axillary region and upper arm and  especially to large cord near elbow    Manual Lymphatic Drainage (MLD) careful to make sure skin stretch is outside of radiation field, left supraclavicular area posteriorly to back. , left inguinal nodes and left abdomen toward inguinal nodes, then to sidlying for posterior interaxillay anastamosis.    Passive ROM left shoulder flex, abd, IR and ER and D2 flex with VC's to relax  PT Long Term Goals - 03/03/21 1118      PT LONG TERM GOAL #1   Title Patient will demonstrate she has regained full shoulder ROM and function post operatively compared to baselines.    Baseline 4/19: pt is having more pain, stiffness and tightness at latter part of radiation    Time 8    Status On-going      PT LONG TERM GOAL #2   Title Patient will increase left shoulder active flexion to >/= 165 degrees for increased ease reaching.    Baseline 109 post/ 165 on 4/4, 150 on 03/03/2021    Time 8    Period Weeks    Status Revised      PT LONG TERM GOAL #3   Title Patient will increase left shoulder active abduction to >/= 165 degrees  for increased ease reaching.    Baseline 150 at eval, 168 on 02/16/2021, 115 on 4/19 2022    Time 8    Period Weeks    Status Revised      PT LONG TERM GOAL #4   Baseline 70.45 post op; 18.18 pre-op/  36% 02/16/2021, 63.64 on 03/03/2021    Time 8    Period Weeks    Status On-going      PT LONG TERM GOAL #5   Title Patient will report >/= 50% decrease in pain for increased tolerance of daily tasks.    Baseline 60% on 02/16/2021    Time 8    Period Weeks    Status On-going      Additional Long Term Goals   Additional Long Term Goals Yes      PT LONG TERM GOAL #6   Title Pt will increase sit to stand in 30 seconds to 12 to indicate an improvement in functional strength    Baseline 7 on 03/03/2021    Time 8    Period Weeks    Status New                 Plan - 03/09/21 1728    Clinical Impression Statement Therapy consisted of MFR techniques to area of axillary and elbow cording, Soft tissue mobilization to the left upper quadrant with significant increased tissue tension particularly in left UT and scapular region.   Pt continues with end range tightness in the left shoulder but improves with VC's to relax.  Skin is very darkened by radiation but with the cream is not too itchy.  She is excited to complete radiation tomorrow.  She hopes to continue working 4 hours as she is very fatigued from the radiation and return to work.    Personal Factors and Comorbidities Comorbidity 3+    Comorbidities Left breast CA fuunctionally triple neg, chemo, radiation    Stability/Clinical Decision Making Evolving/Moderate complexity    Rehab Potential Excellent    PT Frequency 2x / week    PT Duration 8 weeks    PT Treatment/Interventions ADLs/Self Care Home Management;Therapeutic exercise;Patient/family education;Passive range of motion;Manual lymph drainage;Manual techniques;Scar mobilization    PT Next Visit Plan hold MLD while breast is tender from radiation,but consider to non radiated areas   Cont MFR to cording and tight muscles at posterior shoulder  PROM left shoulder,exs to progress strength, review precautions. see if tg soft was helpful    PT Home Exercise Plan Post op shoulder ROM HEP, desensitization techniques, compression bra (now radiation bra) and sleeve, supine scap. series.    Consulted and Agree with Plan  of Care Patient           Patient will benefit from skilled therapeutic intervention in order to improve the following deficits and impairments:  Postural dysfunction,Decreased range of motion,Impaired UE functional use,Pain,Decreased knowledge of precautions,Decreased scar mobility,Increased fascial restricitons,Increased edema,Impaired flexibility  Visit Diagnosis: Stiffness of left shoulder, not elsewhere classified  Aftercare following surgery for neoplasm  Abnormal posture  Localized edema  Disorder of the skin and subcutaneous tissue related to radiation, unspecified  Malignant neoplasm of upper-outer quadrant of left breast in female, estrogen receptor positive ([place])     Problem List Patient Active Problem List   Diagnosis Date Noted  . Lymphedema of left arm 02/02/2021  . Drug-induced neutropenia ([place]) 09/30/2020  . Genetic testing 06/26/2020  . Family history of ovarian cancer   . Family history of prostate cancer   . Malignant neoplasm of upper-outer quadrant of left breast in female, estrogen receptor positive ([place]) 06/03/2020    Claris Pong 03/09/2021, 5:33 PM  [place], [place], [address] Phone: [phone]   Fax:  [phone]  Name: SHEILY LINEMAN MRN: [id number] Date of Birth: Mar 17, 1986  Cheral Almas, PT 03/09/21 5:35 PM

## 2021-03-10 ENCOUNTER — Ambulatory Visit
Admission: RE | Admit: 2021-03-10 | Discharge: 2021-03-10 | Disposition: A | Discharge status: Home / Self Care | Payer: 59 | Source: Ambulatory Visit | Attending: Radiation Oncology | Admitting: Radiation Oncology

## 2021-03-10 ENCOUNTER — Encounter: Payer: Self-pay | Admitting: Radiation Oncology

## 2021-03-10 ENCOUNTER — Inpatient Hospital Stay: Payer: 59

## 2021-03-10 ENCOUNTER — Encounter: Payer: Self-pay | Admitting: *Deleted

## 2021-03-10 DIAGNOSIS — C50412 Malignant neoplasm of upper-outer quadrant of left female breast: Secondary | ICD-10-CM | POA: Diagnosis not present

## 2021-03-10 DIAGNOSIS — Z452 Encounter for adjustment and management of vascular access device: Secondary | ICD-10-CM | POA: Diagnosis not present

## 2021-03-10 DIAGNOSIS — Z95828 Presence of other vascular implants and grafts: Secondary | ICD-10-CM

## 2021-03-10 MED ORDER — SODIUM CHLORIDE 0.9% FLUSH
10.0000 mL | INTRAVENOUS | Status: DC | PRN
  Administered 2021-03-10: 10 mL via INTRAVENOUS
  Filled 2021-03-10: qty 10

## 2021-03-10 MED ORDER — HEPARIN SOD (PORK) LOCK FLUSH 100 UNIT/ML IV SOLN
500.0000 [IU] | Freq: Once | INTRAVENOUS | Status: AC
  Administered 2021-03-10: 500 [IU] via INTRAVENOUS
  Filled 2021-03-10: qty 5

## 2021-03-10 NOTE — Patient Instructions (Signed)
Implanted Port Insertion, Care After This sheet gives you information about how to care for yourself after your procedure. Your health care provider may also give you more specific instructions. If you have problems or questions, contact your health care provider. What can I expect after the procedure? After the procedure, it is common to have:  Discomfort at the port insertion site.  Bruising on the skin over the port. This should improve over 3-4 days. Follow these instructions at home: Port care  After your port is placed, you will get a manufacturer's information card. The card has information about your port. Keep this card with you at all times.  Take care of the port as told by your health care provider. Ask your health care provider if you or a family member can get training for taking care of the port at home. A home health care nurse may also take care of the port.  Make sure to remember what type of port you have. Incision care  Follow instructions from your health care provider about how to take care of your port insertion site. Make sure you: ? Wash your hands with soap and water before and after you change your bandage (dressing). If soap and water are not available, use hand sanitizer. ? Change your dressing as told by your health care provider. ? Leave stitches (sutures), skin glue, or adhesive strips in place. These skin closures may need to stay in place for 2 weeks or longer. If adhesive strip edges start to loosen and curl up, you may trim the loose edges. Do not remove adhesive strips completely unless your health care provider tells you to do that.  Check your port insertion site every day for signs of infection. Check for: ? Redness, swelling, or pain. ? Fluid or blood. ? Warmth. ? Pus or a bad smell.      Activity  Return to your normal activities as told by your health care provider. Ask your health care provider what activities are safe for you.  Do not  lift anything that is heavier than 10 lb (4.5 kg), or the limit that you are told, until your health care provider says that it is safe. General instructions  Take over-the-counter and prescription medicines only as told by your health care provider.  Do not take baths, swim, or use a hot tub until your health care provider approves. Ask your health care provider if you may take showers. You may only be allowed to take sponge baths.  Do not drive for 24 hours if you were given a sedative during your procedure.  Wear a medical alert bracelet in case of an emergency. This will tell any health care providers that you have a port.  Keep all follow-up visits as told by your health care provider. This is important. Contact a health care provider if:  You cannot flush your port with saline as directed, or you cannot draw blood from the port.  You have a fever or chills.  You have redness, swelling, or pain around your port insertion site.  You have fluid or blood coming from your port insertion site.  Your port insertion site feels warm to the touch.  You have pus or a bad smell coming from the port insertion site. Get help right away if:  You have chest pain or shortness of breath.  You have bleeding from your port that you cannot control. Summary  Take care of the port as told by your   health care provider. Keep the manufacturer's information card with you at all times.  Change your dressing as told by your health care provider.  Contact a health care provider if you have a fever or chills or if you have redness, swelling, or pain around your port insertion site.  Keep all follow-up visits as told by your health care provider. This information is not intended to replace advice given to you by your health care provider. Make sure you discuss any questions you have with your health care provider. Document Revised: 05/30/2018 Document Reviewed: 05/30/2018 Elsevier Patient Education   2021 Elsevier Inc.  

## 2021-03-11 ENCOUNTER — Ambulatory Visit: Payer: 59

## 2021-03-11 ENCOUNTER — Other Ambulatory Visit: Payer: Self-pay

## 2021-03-11 DIAGNOSIS — Z170 Estrogen receptor positive status [ER+]: Secondary | ICD-10-CM

## 2021-03-11 DIAGNOSIS — Z483 Aftercare following surgery for neoplasm: Secondary | ICD-10-CM

## 2021-03-11 DIAGNOSIS — M25612 Stiffness of left shoulder, not elsewhere classified: Secondary | ICD-10-CM

## 2021-03-11 DIAGNOSIS — C50412 Malignant neoplasm of upper-outer quadrant of left female breast: Secondary | ICD-10-CM

## 2021-03-11 DIAGNOSIS — R600 Localized edema: Secondary | ICD-10-CM

## 2021-03-11 DIAGNOSIS — L599 Disorder of the skin and subcutaneous tissue related to radiation, unspecified: Secondary | ICD-10-CM

## 2021-03-11 DIAGNOSIS — R293 Abnormal posture: Secondary | ICD-10-CM

## 2021-03-11 NOTE — Therapy (Signed)
[place], [place], [address] Phone: [phone]   Fax:  [phone]  Physical Therapy Treatment  Patient Details  Name: Jeanne Evans MRN: [id number] Date of Birth: 01/04/1986 Referring Provider (PT): Dr. Erroll Luna   Encounter Date: 03/11/2021   PT End of Session - 03/11/21 1717    Visit Number 24    Number of Visits 38    Date for PT Re-Evaluation 05/03/21    PT Start Time 1608    PT Stop Time 1706    PT Time Calculation (min) 58 min    Activity Tolerance Patient tolerated treatment well    Behavior During Therapy [facility] for tasks assessed/performed           Past Medical History:  Diagnosis Date  . Breast cancer ([place])   . Family history of ovarian cancer   . Family history of prostate cancer   . GERD (gastroesophageal reflux disease)   . History of asthma    has albuterol, uses PRN  . History of heart murmur in childhood   . History of multiple allergies     Past Surgical History:  Procedure Laterality Date  . BREAST LUMPECTOMY WITH RADIOACTIVE SEED AND SENTINEL LYMPH NODE BIOPSY Left 11/19/2020   Procedure: LEFT BREAST LUMPECTOMY X 2 WITH RADIOACTIVE SEED AND SENTINEL LYMPH NODE MAPPING;  Surgeon: Erroll Luna, MD;  Location: [place];  Service: General;  Laterality: Left;  PECTORAL BLOCK  . PORTACATH PLACEMENT Right 06/18/2020   Procedure: INSERTION PORT-A-CATH WITH ULTRASOUND GUIDANCE;  Surgeon: Erroll Luna, MD;  Location: [place];  Service: General;  Laterality: Right;  . RE-EXCISION OF BREAST LUMPECTOMY Left 12/09/2020   Procedure: RE-EXCISION OF LEFT BREAST LUMPECTOMY;  Surgeon: Erroll Luna, MD;  Location: [place];  Service: General;  Laterality: Left;    There were no vitals filed for this visit.   Subjective Assessment - 03/11/21 1652    Subjective I rang the bell after radiation yesterday.  I'm really fatigued  today.  They gave me the handout for [facility] class on May 24.  I am going to go to it. Arm feels pretty good today, but its cold in here today and it makes the back of my arm achy.    Pertinent History Patient was diagnosed on 05/12/2020 with left breast cancer. She underwent neoadjuvant chemotherapy 06/19/2020-09/23/2020 and had a left lumpectomy and sentinel node biopsy (6 negative nodes) on 11/19/2020.  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    Patient Stated Goals Decrease pain and improve shoulder ROM              [facility] PT Assessment - 03/11/21 0001      AROM   Left Shoulder Flexion 164 Degrees    Left Shoulder ABduction 165 Degrees                         [facility] Adult PT Treatment/Exercise - 03/11/21 0001      Shoulder Exercises: Supine   Other Supine Exercises AROM flex, scaption, horizontal abd x 10 , 1# wt flex,scaption                      PT Long Term Goals - 03/03/21 1118      PT LONG TERM GOAL #1   Title Patient will demonstrate she has regained full shoulder ROM and function post operatively compared to  baselines.    Baseline 4/19: pt is having more pain, stiffness and tightness at latter part of radiation    Time 8    Status On-going      PT LONG TERM GOAL #2   Title Patient will increase left shoulder active flexion to >/= 165 degrees for increased ease reaching.    Baseline 109 post/ 165 on 4/4, 150 on 03/03/2021    Time 8    Period Weeks    Status Revised      PT LONG TERM GOAL #3   Title Patient will increase left shoulder active abduction to >/= 165 degrees for increased ease reaching.    Baseline 150 at eval, 168 on 02/16/2021, 115 on 4/19 2022    Time 8    Period Weeks    Status Revised      PT LONG TERM GOAL #4   Baseline 70.45 post op; 18.18 pre-op/  36% 02/16/2021, 63.64 on 03/03/2021    Time 8    Period Weeks    Status On-going      PT LONG TERM GOAL #5   Title Patient will report >/= 50% decrease in pain for  increased tolerance of daily tasks.    Baseline 60% on 02/16/2021    Time 8    Period Weeks    Status On-going      Additional Long Term Goals   Additional Long Term Goals Yes      PT LONG TERM GOAL #6   Title Pt will increase sit to stand in 30 seconds to 12 to indicate an improvement in functional strength    Baseline 7 on 03/03/2021    Time 8    Period Weeks    Status New                 Plan - 03/11/21 1717    Clinical Impression Statement Pt continues with excessive fatigue from radiation and return to work activities.  She finished radiation yesterday and rang the bell but ie experiencing some depression which the Dr. said is normal for alot of people like a PTSD.  MFR was performed to left axillary region, medial arm and elbow, and a "pop" was felt during release at elbow.  There is continued firmness at breast incision and 2 new chip packs were made for this area as pt lost her others.  There was good improvement in shoulder ROM today back to prior levels.    Personal Factors and Comorbidities Comorbidity 3+    Comorbidities Left breast CA fuunctionally triple neg, chemo, radiation    Stability/Clinical Decision Making Evolving/Moderate complexity    Rehab Potential Excellent    PT Frequency 2x / week    PT Duration 8 weeks    PT Treatment/Interventions ADLs/Self Care Home Management;Therapeutic exercise;Patient/family education;Passive range of motion;Manual lymph drainage;Manual techniques;Scar mobilization    PT Next Visit Plan hold MLD while breast is tender from radiation,but consider to non radiated areas  Cont MFR to cording and tight muscles at posterior shoulder  PROM left shoulder,exs to progress strength, review precautions. see if tg soft was helpful.  add standing TB to HEP    PT Home Exercise Plan Post op shoulder ROM HEP, desensitization techniques, compression bra (now radiation bra) and sleeve, supine scap. series.    Consulted and Agree with Plan of Care  Patient           Patient will benefit from skilled therapeutic intervention in order to improve the following  deficits and impairments:  Postural dysfunction,Decreased range of motion,Impaired UE functional use,Pain,Decreased knowledge of precautions,Decreased scar mobility,Increased fascial restricitons,Increased edema,Impaired flexibility  Visit Diagnosis: Stiffness of left shoulder, not elsewhere classified  Aftercare following surgery for neoplasm  Abnormal posture  Localized edema  Disorder of the skin and subcutaneous tissue related to radiation, unspecified  Malignant neoplasm of upper-outer quadrant of left breast in female, estrogen receptor positive ([place])     Problem List Patient Active Problem List   Diagnosis Date Noted  . Lymphedema of left arm 02/02/2021  . Drug-induced neutropenia ([place]) 09/30/2020  . Genetic testing 06/26/2020  . Family history of ovarian cancer   . Family history of prostate cancer   . Malignant neoplasm of upper-outer quadrant of left breast in female, estrogen receptor positive ([place]) 06/03/2020    Claris Pong 03/11/2021, 5:27 PM  [place], [place], [address] Phone: [phone]   Fax:  [phone]  Name: Jeanne Evans MRN: [id number] Date of Birth: [birth year] Cheral Almas, PT 03/11/21 5:28 PM

## 2021-03-13 ENCOUNTER — Encounter: Payer: Self-pay | Admitting: *Deleted

## 2021-03-13 ENCOUNTER — Encounter: Payer: 59 | Admitting: Physical Therapy

## 2021-03-13 LAB — SURGICAL PATHOLOGY

## 2021-03-17 ENCOUNTER — Other Ambulatory Visit: Payer: Self-pay

## 2021-03-17 ENCOUNTER — Ambulatory Visit: Payer: 59 | Attending: Surgery

## 2021-03-17 DIAGNOSIS — R293 Abnormal posture: Secondary | ICD-10-CM | POA: Insufficient documentation

## 2021-03-17 DIAGNOSIS — Z170 Estrogen receptor positive status [ER+]: Secondary | ICD-10-CM | POA: Insufficient documentation

## 2021-03-17 DIAGNOSIS — Z483 Aftercare following surgery for neoplasm: Secondary | ICD-10-CM | POA: Diagnosis present

## 2021-03-17 DIAGNOSIS — R600 Localized edema: Secondary | ICD-10-CM | POA: Diagnosis present

## 2021-03-17 DIAGNOSIS — L599 Disorder of the skin and subcutaneous tissue related to radiation, unspecified: Secondary | ICD-10-CM | POA: Diagnosis present

## 2021-03-17 DIAGNOSIS — C50412 Malignant neoplasm of upper-outer quadrant of left female breast: Secondary | ICD-10-CM | POA: Diagnosis present

## 2021-03-17 DIAGNOSIS — M25612 Stiffness of left shoulder, not elsewhere classified: Secondary | ICD-10-CM | POA: Insufficient documentation

## 2021-03-17 NOTE — Therapy (Signed)
[place], [place], [address] Phone: [phone]   Fax:  [phone]  Physical Therapy Treatment  Patient Details  Name: Jeanne Evans MRN: [id number] Date of Birth: 27-Feb-1986 Referring Provider (PT): Dr. Erroll Luna   Encounter Date: 03/17/2021   PT End of Session - 03/17/21 1151    Visit Number 25    Number of Visits 38    Date for PT Re-Evaluation 05/03/21    PT Start Time 1111    PT Stop Time 1154    PT Time Calculation (min) 43 min    Activity Tolerance Patient tolerated treatment well    Behavior During Therapy [facility] for tasks assessed/performed           Past Medical History:  Diagnosis Date  . Breast cancer ([place])   . Family history of ovarian cancer   . Family history of prostate cancer   . GERD (gastroesophageal reflux disease)   . History of asthma    has albuterol, uses PRN  . History of heart murmur in childhood   . History of multiple allergies     Past Surgical History:  Procedure Laterality Date  . BREAST LUMPECTOMY WITH RADIOACTIVE SEED AND SENTINEL LYMPH NODE BIOPSY Left 11/19/2020   Procedure: LEFT BREAST LUMPECTOMY X 2 WITH RADIOACTIVE SEED AND SENTINEL LYMPH NODE MAPPING;  Surgeon: Erroll Luna, MD;  Location: [place];  Service: General;  Laterality: Left;  PECTORAL BLOCK  . PORTACATH PLACEMENT Right 06/18/2020   Procedure: INSERTION PORT-A-CATH WITH ULTRASOUND GUIDANCE;  Surgeon: Erroll Luna, MD;  Location: [place];  Service: General;  Laterality: Right;  . RE-EXCISION OF BREAST LUMPECTOMY Left 12/09/2020   Procedure: RE-EXCISION OF LEFT BREAST LUMPECTOMY;  Surgeon: Erroll Luna, MD;  Location: [place];  Service: General;  Laterality: Left;    There were no vitals filed for this visit.   Subjective Assessment - 03/17/21 1113    Subjective Skin is less sensitive to touch now and it is getting less dark.   Still have the sensitivity behind her arm. Still tight through axillary region but not as bad. 10 min late.  The breast doesn't feel heavy anymore.  I started back to the chip pack again and it does make the firm area smaller, but I haven't worn it in a few days and it has gotten bigger again.    Pertinent History Patient was diagnosed on 05/12/2020 with left breast cancer. She underwent neoadjuvant chemotherapy 06/19/2020-09/23/2020 and had a left lumpectomy and sentinel node biopsy (6 negative nodes) on 11/19/2020.  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    Currently in Pain? Yes    Pain Score 7     Pain Location Arm   behind arm   Pain Orientation Left    Pain Descriptors / Indicators Numbness    Pain Type Chronic pain    Pain Onset More than a month ago    Pain Frequency Constant    Multiple Pain Sites No                             [facility] Adult PT Treatment/Exercise - 03/17/21 0001      Shoulder Exercises: Supine   Other Supine Exercises AROM flex, scaption, horizontal abd x 5      Manual Therapy   Soft tissue mobilization outside of radiation field with cocoa butter to pecs,posterior  shoulder, scapular area and upper traps and posterior upper arm    Myofascial Release left axillary region and upper arm and  especially to large cord near elbow    Passive ROM left shoulder flex, abd, IR and ER and D2 flex with VC's to relax                       PT Long Term Goals - 03/03/21 1118      PT LONG TERM GOAL #1   Title Patient will demonstrate she has regained full shoulder ROM and function post operatively compared to baselines.    Baseline 4/19: pt is having more pain, stiffness and tightness at latter part of radiation    Time 8    Status On-going      PT LONG TERM GOAL #2   Title Patient will increase left shoulder active flexion to >/= 165 degrees for increased ease reaching.    Baseline 109 post/ 165 on 4/4, 150 on 03/03/2021    Time 8     Period Weeks    Status Revised      PT LONG TERM GOAL #3   Title Patient will increase left shoulder active abduction to >/= 165 degrees for increased ease reaching.    Baseline 150 at eval, 168 on 02/16/2021, 115 on 4/19 2022    Time 8    Period Weeks    Status Revised      PT LONG TERM GOAL #4   Baseline 70.45 post op; 18.18 pre-op/  36% 02/16/2021, 63.64 on 03/03/2021    Time 8    Period Weeks    Status On-going      PT LONG TERM GOAL #5   Title Patient will report >/= 50% decrease in pain for increased tolerance of daily tasks.    Baseline 60% on 02/16/2021    Time 8    Period Weeks    Status On-going      Additional Long Term Goals   Additional Long Term Goals Yes      PT LONG TERM GOAL #6   Title Pt will increase sit to stand in 30 seconds to 12 to indicate an improvement in functional strength    Baseline 7 on 03/03/2021    Time 8    Period Weeks    Status New                 Plan - 03/17/21 1152    Clinical Impression Statement Pt feels her mood has improved alot over the last few days and she no longer feels like she has to sleep all the time.  The fibrotic area at her incision has improved significantly with the chip pack but she has not worn now for several days.  She was advised to try her compression bra again now that her skin sensitivity has improved and use the chip pack more regularly.  Pt felt better after rx today,  She had multiple tender points and increased tissue tension in scapular region today.  Still lacking end ranges of ROM.    Personal Factors and Comorbidities Comorbidity 3+    Comorbidities Left breast CA fuunctionally triple neg, chemo, radiation    Stability/Clinical Decision Making Stable/Uncomplicated    Rehab Potential Excellent    PT Frequency 2x / week    PT Duration 8 weeks    PT Treatment/Interventions ADLs/Self Care Home Management;Therapeutic exercise;Patient/family education;Passive range of motion;Manual lymph drainage;Manual  techniques;Scar mobilization  PT Next Visit Plan hold MLD while breast is tender from radiation,but consider to non radiated areas  Cont MFR to cording and tight muscles at posterior shoulder  PROM left shoulder,exs to progress strength, review precautions. see if tg soft was helpful.    PT Home Exercise Plan Post op shoulder ROM HEP, desensitization techniques, compression bra (now radiation bra) and sleeve, supine scap. series, standing TB    Consulted and Agree with Plan of Care Patient           Patient will benefit from skilled therapeutic intervention in order to improve the following deficits and impairments:  Postural dysfunction,Decreased range of motion,Impaired UE functional use,Pain,Decreased knowledge of precautions,Decreased scar mobility,Increased fascial restricitons,Increased edema,Impaired flexibility  Visit Diagnosis: Stiffness of left shoulder, not elsewhere classified  Aftercare following surgery for neoplasm  Abnormal posture  Localized edema  Disorder of the skin and subcutaneous tissue related to radiation, unspecified     Problem List Patient Active Problem List   Diagnosis Date Noted  . Lymphedema of left arm 02/02/2021  . Drug-induced neutropenia ([place]) 09/30/2020  . Genetic testing 06/26/2020  . Family history of ovarian cancer   . Family history of prostate cancer   . Malignant neoplasm of upper-outer quadrant of left breast in female, estrogen receptor positive ([place]) 06/03/2020    Claris Pong 03/17/2021, 12:03 PM  [place], [place], [address] Phone: [phone]   Fax:  [phone]  Name: VENESHA PETRAITIS MRN: [id number] Date of Birth: July 29, 1986  Cheral Almas, PT 03/17/21 12:04 PM

## 2021-03-19 ENCOUNTER — Other Ambulatory Visit: Payer: Self-pay

## 2021-03-19 ENCOUNTER — Ambulatory Visit: Payer: 59

## 2021-03-19 DIAGNOSIS — Z170 Estrogen receptor positive status [ER+]: Secondary | ICD-10-CM

## 2021-03-19 DIAGNOSIS — M25612 Stiffness of left shoulder, not elsewhere classified: Secondary | ICD-10-CM | POA: Diagnosis not present

## 2021-03-19 DIAGNOSIS — L599 Disorder of the skin and subcutaneous tissue related to radiation, unspecified: Secondary | ICD-10-CM

## 2021-03-19 DIAGNOSIS — C50412 Malignant neoplasm of upper-outer quadrant of left female breast: Secondary | ICD-10-CM

## 2021-03-19 DIAGNOSIS — R600 Localized edema: Secondary | ICD-10-CM

## 2021-03-19 DIAGNOSIS — R293 Abnormal posture: Secondary | ICD-10-CM

## 2021-03-19 DIAGNOSIS — Z483 Aftercare following surgery for neoplasm: Secondary | ICD-10-CM

## 2021-03-19 NOTE — Therapy (Signed)
[place], [place], [address] Phone: [phone]   Fax:  [phone]  Physical Therapy Treatment  Patient Details  Name: Jeanne Evans MRN: [id number] Date of Birth: 1986-11-04 Referring Provider (PT): Dr. Erroll Luna   Encounter Date: 03/19/2021   PT End of Session - 03/19/21 1152    Visit Number 26    Number of Visits 38    Date for PT Re-Evaluation 05/03/21    PT Start Time 1104    PT Stop Time 1152    PT Time Calculation (min) 48 min    Activity Tolerance Patient tolerated treatment well    Behavior During Therapy [facility] for tasks assessed/performed           Past Medical History:  Diagnosis Date  . Breast cancer ([place])   . Family history of ovarian cancer   . Family history of prostate cancer   . GERD (gastroesophageal reflux disease)   . History of asthma    has albuterol, uses PRN  . History of heart murmur in childhood   . History of multiple allergies     Past Surgical History:  Procedure Laterality Date  . BREAST LUMPECTOMY WITH RADIOACTIVE SEED AND SENTINEL LYMPH NODE BIOPSY Left 11/19/2020   Procedure: LEFT BREAST LUMPECTOMY X 2 WITH RADIOACTIVE SEED AND SENTINEL LYMPH NODE MAPPING;  Surgeon: Erroll Luna, MD;  Location: [place];  Service: General;  Laterality: Left;  PECTORAL BLOCK  . PORTACATH PLACEMENT Right 06/18/2020   Procedure: INSERTION PORT-A-CATH WITH ULTRASOUND GUIDANCE;  Surgeon: Erroll Luna, MD;  Location: [place];  Service: General;  Laterality: Right;  . RE-EXCISION OF BREAST LUMPECTOMY Left 12/09/2020   Procedure: RE-EXCISION OF LEFT BREAST LUMPECTOMY;  Surgeon: Erroll Luna, MD;  Location: [place];  Service: General;  Laterality: Left;    There were no vitals filed for this visit.   Subjective Assessment - 03/19/21 1107    Subjective Just getting out of work. Didn't have to do as much today. The  numbness behind my arm still really bothers me. It isn't as bad today. When I get cold the arm really aches.  My neck/traps are really sore today on the left.  Have the compression bra on today    Pertinent History Patient was diagnosed on 05/12/2020 with left breast cancer. She underwent neoadjuvant chemotherapy 06/19/2020-09/23/2020 and had a left lumpectomy and sentinel node biopsy (6 negative nodes) on 11/19/2020.  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    Patient Stated Goals Decrease pain and improve shoulder ROM    Currently in Pain? Yes    Pain Location Arm    Pain Orientation Left    Pain Descriptors / Indicators Numbness    Pain Type Chronic pain              [facility] PT Assessment - 03/19/21 0001      Assessment   Medical Diagnosis s/p left lumpectomy and sentinel node biopsy    Referring Provider (PT) Dr. [name] [place]    Onset Date/Surgical Date 11/19/20                         [place] Adult PT Treatment/Exercise - 03/19/21 0001      Shoulder Exercises: Supine   Other Supine Exercises AROM flex, scaption, horizontal abd x 10      Manual Therapy   Soft tissue mobilization outside of radiation  field with cocoa butter to pecs,posterior shoulder, scapular area and upper traps and posterior upper arm    Myofascial Release left axillary region and upper arm    Passive ROM left shoulder flex, abd, IR and ER and D2 flex with VC's to relax                       PT Long Term Goals - 03/03/21 1118      PT LONG TERM GOAL #1   Title Patient will demonstrate she has regained full shoulder ROM and function post operatively compared to baselines.    Baseline 4/19: pt is having more pain, stiffness and tightness at latter part of radiation    Time 8    Status On-going      PT LONG TERM GOAL #2   Title Patient will increase left shoulder active flexion to >/= 165 degrees for increased ease reaching.    Baseline 109 post/ 165 on 4/4, 150 on  03/03/2021    Time 8    Period Weeks    Status Revised      PT LONG TERM GOAL #3   Title Patient will increase left shoulder active abduction to >/= 165 degrees for increased ease reaching.    Baseline 150 at eval, 168 on 02/16/2021, 115 on 4/19 2022    Time 8    Period Weeks    Status Revised      PT LONG TERM GOAL #4   Baseline 70.45 post op; 18.18 pre-op/  36% 02/16/2021, 63.64 on 03/03/2021    Time 8    Period Weeks    Status On-going      PT LONG TERM GOAL #5   Title Patient will report >/= 50% decrease in pain for increased tolerance of daily tasks.    Baseline 60% on 02/16/2021    Time 8    Period Weeks    Status On-going      Additional Long Term Goals   Additional Long Term Goals Yes      PT LONG TERM GOAL #6   Title Pt will increase sit to stand in 30 seconds to 12 to indicate an improvement in functional strength    Baseline 7 on 03/03/2021    Time 8    Period Weeks    Status New                 Plan - 03/19/21 1155    Clinical Impression Statement Pt had forgotten to try her compression bra until this am so chip pack hasn't been in very long today.  Pt very tight in bilateral UT today and she notes feeling like she has been holding her shoulders up alot and having trouble relaxing them. Pt was very tight throughout bilateral UT's, left pectoralis and left interscapular area today.  Endurance for AROM in supine has improved and she can do 10 reps without too much difficulty now    Personal Factors and Comorbidities Comorbidity 3+    Comorbidities Left breast CA fuunctionally triple neg, chemo, radiation    Stability/Clinical Decision Making Stable/Uncomplicated    Rehab Potential Excellent    PT Frequency 2x / week    PT Duration 8 weeks    PT Treatment/Interventions ADLs/Self Care Home Management;Therapeutic exercise;Patient/family education;Passive range of motion;Manual lymph drainage;Manual techniques;Scar mobilization    PT Next Visit Plan hold MLD while  breast is tender from radiation,but consider to non radiated areas  Cont MFR to cording  and tight muscles at posterior shoulder  PROM left shoulder,exs to progress strength, review precautions. see if tg soft was helpful.    PT Home Exercise Plan Post op shoulder ROM HEP, desensitization techniques, compression bra (now radiation bra) and sleeve, supine scap. series, standing TB    Consulted and Agree with Plan of Care Patient    Family Member Consulted Mother           Patient will benefit from skilled therapeutic intervention in order to improve the following deficits and impairments:  Postural dysfunction,Decreased range of motion,Impaired UE functional use,Pain,Decreased knowledge of precautions,Decreased scar mobility,Increased fascial restricitons,Increased edema,Impaired flexibility  Visit Diagnosis: Stiffness of left shoulder, not elsewhere classified  Aftercare following surgery for neoplasm  Abnormal posture  Localized edema  Disorder of the skin and subcutaneous tissue related to radiation, unspecified  Malignant neoplasm of upper-outer quadrant of left breast in female, estrogen receptor positive ([place])     Problem List Patient Active Problem List   Diagnosis Date Noted  . Lymphedema of left arm 02/02/2021  . Drug-induced neutropenia ([place]) 09/30/2020  . Genetic testing 06/26/2020  . Family history of ovarian cancer   . Family history of prostate cancer   . Malignant neoplasm of upper-outer quadrant of left breast in female, estrogen receptor positive ([place]) 06/03/2020    Claris Pong 03/19/2021, 12:12 PM  [place], [place], [address] Phone: [phone]   Fax:  [phone]  Name: Jeanne Evans MRN: [id number] Date of Birth: Jan 04, 1986 Cheral Almas, PT 03/19/21 12:13 PM

## 2021-03-19 NOTE — Progress Notes (Signed)
  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 She developed anticipated skin changes and fatigue during treatment without desquamation.  Plan: The patient will receive a call in about one month from the radiation oncology department. She will continue follow up with Dr. Jana Hakim as well.   ________________________________________________    Carola Rhine, [facility]

## 2021-03-23 ENCOUNTER — Ambulatory Visit: Payer: 59

## 2021-03-24 ENCOUNTER — Other Ambulatory Visit: Payer: Self-pay

## 2021-03-24 ENCOUNTER — Ambulatory Visit: Payer: 59

## 2021-03-24 DIAGNOSIS — R600 Localized edema: Secondary | ICD-10-CM

## 2021-03-24 DIAGNOSIS — R293 Abnormal posture: Secondary | ICD-10-CM

## 2021-03-24 DIAGNOSIS — M25612 Stiffness of left shoulder, not elsewhere classified: Secondary | ICD-10-CM

## 2021-03-24 DIAGNOSIS — Z170 Estrogen receptor positive status [ER+]: Secondary | ICD-10-CM

## 2021-03-24 DIAGNOSIS — Z483 Aftercare following surgery for neoplasm: Secondary | ICD-10-CM

## 2021-03-24 DIAGNOSIS — L599 Disorder of the skin and subcutaneous tissue related to radiation, unspecified: Secondary | ICD-10-CM

## 2021-03-24 DIAGNOSIS — C50412 Malignant neoplasm of upper-outer quadrant of left female breast: Secondary | ICD-10-CM

## 2021-03-24 NOTE — Therapy (Signed)
[place], [place], [address] Phone: [phone]   Fax:  [phone]  Physical Therapy Treatment  Patient Details  Name: Jeanne Evans MRN: [id number] Date of Birth: October 29, 1986 Referring Provider (PT): Dr. Erroll Luna   Encounter Date: 03/24/2021   PT End of Session - 03/24/21 1213    Visit Number 27    Number of Visits 38    Date for PT Re-Evaluation 05/03/21    PT Start Time 1003    PT Stop Time 1103    PT Time Calculation (min) 60 min    Activity Tolerance Patient tolerated treatment well    Behavior During Therapy [facility] for tasks assessed/performed           Past Medical History:  Diagnosis Date  . Breast cancer ([place])   . Family history of ovarian cancer   . Family history of prostate cancer   . GERD (gastroesophageal reflux disease)   . History of asthma    has albuterol, uses PRN  . History of heart murmur in childhood   . History of multiple allergies     Past Surgical History:  Procedure Laterality Date  . BREAST LUMPECTOMY WITH RADIOACTIVE SEED AND SENTINEL LYMPH NODE BIOPSY Left 11/19/2020   Procedure: LEFT BREAST LUMPECTOMY X 2 WITH RADIOACTIVE SEED AND SENTINEL LYMPH NODE MAPPING;  Surgeon: Erroll Luna, MD;  Location: [place];  Service: General;  Laterality: Left;  PECTORAL BLOCK  . PORTACATH PLACEMENT Right 06/18/2020   Procedure: INSERTION PORT-A-CATH WITH ULTRASOUND GUIDANCE;  Surgeon: Erroll Luna, MD;  Location: [place];  Service: General;  Laterality: Right;  . RE-EXCISION OF BREAST LUMPECTOMY Left 12/09/2020   Procedure: RE-EXCISION OF LEFT BREAST LUMPECTOMY;  Surgeon: Erroll Luna, MD;  Location: [place];  Service: General;  Laterality: Left;    There were no vitals filed for this visit.   Subjective Assessment - 03/24/21 1015    Subjective Pt. missed screen yesterday and was performed today.  Havent  been wearing the sleeve as much unless my arm is bothering me or its cold.  Neck and UT don't feel quite as tight but axillary region feels tighter.    Pertinent History Patient was diagnosed on 05/12/2020 with left breast cancer. She underwent neoadjuvant chemotherapy 06/19/2020-09/23/2020 and had a left lumpectomy and sentinel node biopsy (6 negative nodes) on 11/19/2020.  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    Patient Stated Goals Decrease pain and improve shoulder ROM    Currently in Pain? Yes    Pain Score 4     Pain Location Axilla    Pain Orientation Left    Pain Onset More than a month ago    Multiple Pain Sites No                  L-DEX FLOWSHEETS - 03/24/21 1000      L-DEX LYMPHEDEMA SCREENING   Measurement Type Unilateral    L-DEX MEASUREMENT EXTREMITY Upper Extremity    POSITION  Standing    DOMINANT SIDE Right    At Risk Side Left    BASELINE SCORE (UNILATERAL) -1.2    L-DEX SCORE (UNILATERAL) 5.7    VALUE CHANGE (UNILAT) 6.9                     [facility] Adult PT Treatment/Exercise - 03/24/21 0001      Manual Therapy   Soft tissue  mobilization outside of radiation field with cocoa butter to pecs,posterior shoulder,  and upper traps    Myofascial Release left axillary region and upper arm    Passive ROM left shoulder flex, abd, IR and ER and D2 flex with VC's to relax                       PT Long Term Goals - 03/03/21 1118      PT LONG TERM GOAL #1   Title Patient will demonstrate she has regained full shoulder ROM and function post operatively compared to baselines.    Baseline 4/19: pt is having more pain, stiffness and tightness at latter part of radiation    Time 8    Status On-going      PT LONG TERM GOAL #2   Title Patient will increase left shoulder active flexion to >/= 165 degrees for increased ease reaching.    Baseline 109 post/ 165 on 4/4, 150 on 03/03/2021    Time 8    Period Weeks    Status Revised       PT LONG TERM GOAL #3   Title Patient will increase left shoulder active abduction to >/= 165 degrees for increased ease reaching.    Baseline 150 at eval, 168 on 02/16/2021, 115 on 4/19 2022    Time 8    Period Weeks    Status Revised      PT LONG TERM GOAL #4   Baseline 70.45 post op; 18.18 pre-op/  36% 02/16/2021, 63.64 on 03/03/2021    Time 8    Period Weeks    Status On-going      PT LONG TERM GOAL #5   Title Patient will report >/= 50% decrease in pain for increased tolerance of daily tasks.    Baseline 60% on 02/16/2021    Time 8    Period Weeks    Status On-going      Additional Long Term Goals   Additional Long Term Goals Yes      PT LONG TERM GOAL #6   Title Pt will increase sit to stand in 30 seconds to 12 to indicate an improvement in functional strength    Baseline 7 on 03/03/2021    Time 8    Period Weeks    Status New                 Plan - 03/24/21 1214    Clinical Impression Statement pts. LDex screen was performed today secondary to pt missing her appt yesterday.  Her score was increased to 6.9 points past the baseline.  She was advised to wear her compression sleeve continuously for 1 month and to set up another screen in 4  weeks to reassess.  We discussed laundering of her sleeve an putting in dryer for a short period to tighten it up a little.  I also gave her some foam to place in the top to prevent sliding down.  We spent time measuring her arm and trying on some of our medi sleeves but they weren't long enough.  She continues with tenderness at the axillary region and mild cording.  She is still bothered the most by numbness in her posterior arm. Large nodular areas still noted in incisions. She is still experiencing fatigue with her work activities    Personal Factors and Comorbidities Comorbidity 3+    Comorbidities Left breast CA fuunctionally triple neg, chemo, radiation    Stability/Clinical Decision  Making Stable/Uncomplicated    Rehab Potential  Excellent    PT Frequency 2x / week    PT Duration 8 weeks    PT Treatment/Interventions ADLs/Self Care Home Management;Therapeutic exercise;Patient/family education;Passive range of motion;Manual lymph drainage;Manual techniques;Scar mobilization    PT Next Visit Plan Pt to continue wearing her sleeve on a daily basis until 1 month SOZO screen around June 7 secondary to pt 6.9 points above baseline today. Cont. MFR techniques, PROM, STM. check goals    PT Home Exercise Plan Post op shoulder ROM HEP, desensitization techniques, compression bra (now radiation bra) and sleeve, supine scap. series, standing TB    Consulted and Agree with Plan of Care Patient           Patient will benefit from skilled therapeutic intervention in order to improve the following deficits and impairments:  Postural dysfunction,Decreased range of motion,Impaired UE functional use,Pain,Decreased knowledge of precautions,Decreased scar mobility,Increased fascial restricitons,Increased edema,Impaired flexibility  Visit Diagnosis: Stiffness of left shoulder, not elsewhere classified  Aftercare following surgery for neoplasm  Abnormal posture  Localized edema  Disorder of the skin and subcutaneous tissue related to radiation, unspecified  Malignant neoplasm of upper-outer quadrant of left breast in female, estrogen receptor positive ([place])     Problem List Patient Active Problem List   Diagnosis Date Noted  . Lymphedema of left arm 02/02/2021  . Drug-induced neutropenia ([place]) 09/30/2020  . Genetic testing 06/26/2020  . Family history of ovarian cancer   . Family history of prostate cancer   . Malignant neoplasm of upper-outer quadrant of left breast in female, estrogen receptor positive ([place]) 06/03/2020    Claris Pong 03/24/2021, 12:21 PM  [place], [place], [address] Phone: [phone]   Fax:  [phone]  Name:  Jeanne Evans MRN: [id number] Date of Birth: 1986-09-05  Cheral Almas, PT 03/24/21 12:22 PM

## 2021-03-27 ENCOUNTER — Ambulatory Visit: Payer: 59

## 2021-03-27 ENCOUNTER — Other Ambulatory Visit: Payer: Self-pay

## 2021-03-27 DIAGNOSIS — R600 Localized edema: Secondary | ICD-10-CM

## 2021-03-27 DIAGNOSIS — L599 Disorder of the skin and subcutaneous tissue related to radiation, unspecified: Secondary | ICD-10-CM

## 2021-03-27 DIAGNOSIS — M25612 Stiffness of left shoulder, not elsewhere classified: Secondary | ICD-10-CM

## 2021-03-27 DIAGNOSIS — Z483 Aftercare following surgery for neoplasm: Secondary | ICD-10-CM

## 2021-03-27 DIAGNOSIS — R293 Abnormal posture: Secondary | ICD-10-CM

## 2021-03-27 DIAGNOSIS — Z170 Estrogen receptor positive status [ER+]: Secondary | ICD-10-CM

## 2021-03-27 DIAGNOSIS — C50412 Malignant neoplasm of upper-outer quadrant of left female breast: Secondary | ICD-10-CM

## 2021-03-27 NOTE — Therapy (Signed)
[place], [place], [address] Phone: [phone]   Fax:  [phone]  Physical Therapy Treatment  Patient Details  Name: Jeanne Evans MRN: [id number] Date of Birth: 09-Dec-1985 Referring Provider (PT): Dr. Erroll Luna   Encounter Date: 03/27/2021   PT End of Session - 03/27/21 1213    Visit Number 28    Number of Visits 38    Date for PT Re-Evaluation 05/03/21    PT Start Time 1110    PT Stop Time 1204    PT Time Calculation (min) 54 min    Activity Tolerance Patient tolerated treatment well    Behavior During Therapy [facility] for tasks assessed/performed           Past Medical History:  Diagnosis Date  . Breast cancer ([place])   . Family history of ovarian cancer   . Family history of prostate cancer   . GERD (gastroesophageal reflux disease)   . History of asthma    has albuterol, uses PRN  . History of heart murmur in childhood   . History of multiple allergies     Past Surgical History:  Procedure Laterality Date  . BREAST LUMPECTOMY WITH RADIOACTIVE SEED AND SENTINEL LYMPH NODE BIOPSY Left 11/19/2020   Procedure: LEFT BREAST LUMPECTOMY X 2 WITH RADIOACTIVE SEED AND SENTINEL LYMPH NODE MAPPING;  Surgeon: Erroll Luna, MD;  Location: [place];  Service: General;  Laterality: Left;  PECTORAL BLOCK  . PORTACATH PLACEMENT Right 06/18/2020   Procedure: INSERTION PORT-A-CATH WITH ULTRASOUND GUIDANCE;  Surgeon: Erroll Luna, MD;  Location: [place];  Service: General;  Laterality: Right;  . RE-EXCISION OF BREAST LUMPECTOMY Left 12/09/2020   Procedure: RE-EXCISION OF LEFT BREAST LUMPECTOMY;  Surgeon: Erroll Luna, MD;  Location: [place];  Service: General;  Laterality: Left;    There were no vitals filed for this visit.   Subjective Assessment - 03/27/21 1112    Subjective Pt has been compliant with sleeve wear.  She took the day off   yesterday because she was worn out.    Pertinent History Patient was diagnosed on 05/12/2020 with left breast cancer. She underwent neoadjuvant chemotherapy 06/19/2020-09/23/2020 and had a left lumpectomy and sentinel node biopsy (6 negative nodes) on 11/19/2020.  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    Currently in Pain? No/denies    Pain Score 0-No pain              [facility] PT Assessment - 03/27/21 0001      AROM   Left Shoulder Flexion 165 Degrees    Left Shoulder ABduction 167 Degrees    Left Shoulder External Rotation 95 Degrees                         [facility] Adult PT Treatment/Exercise - 03/27/21 0001      Manual Therapy   Soft tissue mobilization outside of radiation field with cocoa butter to pecs,posterior shoulder,  and upper traps    Myofascial Release left axillary region and upper arm    Manual Lymphatic Drainage (MLD) Pt instructed in self MLD to left UE while observing therapist.    Passive ROM left shoulder flex, abd, IR and ER and D2 flex with VC's to relax                       PT Long Term Goals -  03/27/21 1205      PT LONG TERM GOAL #1   Title Patient will demonstrate she has regained full shoulder ROM and function post operatively compared to baselines.    Time 8    Period Weeks    Status On-going      PT LONG TERM GOAL #2   Title Patient will increase left shoulder active flexion to >/= 165 degrees for increased ease reaching.    Baseline 165    Time 8    Period Weeks    Status Achieved      PT LONG TERM GOAL #3   Title Patient will increase left shoulder active abduction to >/= 165 degrees for increased ease reaching.    Time 8    Period Weeks    Status On-going      PT LONG TERM GOAL #4   Title Patient will improve her DASH score to be </= 20 for improved overall upper extremity function.    Time 8    Period Weeks    Status On-going      PT LONG TERM GOAL #5   Title Patient will report >/= 50%  decrease in pain for increased tolerance of daily tasks.    Baseline 65% better    Time 8    Period Weeks    Status Achieved      PT LONG TERM GOAL #6   Title Pt will increase sit to stand in 30 seconds to 12 to indicate an improvement in functional strength    Time 8    Period Weeks    Status New                 Plan - 03/27/21 1213    Clinical Impression Statement Pt was instructed in self MLD to the left UE secondary to her score being high on when she had her last SOZO screen.  We discussed pt performing regularly for the next month while wearing her sleeve to try and bring her score down into the green again.  Shoulder ROM is improved but still a little tight at end ranges.  SHe continues to get large nodular areas in region of incision in the left breast which reduce with chip pack but return when not using chip pack.    Personal Factors and Comorbidities Comorbidity 3+    Comorbidities Left breast CA fuunctionally triple neg, chemo, radiation    Stability/Clinical Decision Making Stable/Uncomplicated    Rehab Potential Excellent    PT Frequency 2x / week    PT Duration 8 weeks    PT Treatment/Interventions ADLs/Self Care Home Management;Therapeutic exercise;Patient/family education;Passive range of motion;Manual lymph drainage;Manual techniques;Scar mobilization    PT Next Visit Plan Pt to continue wearing her sleeve on a daily basis until 1 month SOZO screen around June 7 secondary to pt 6.9 points above baseline today and was educated in self MLD to UE, Cont. MFR techniques, PROM, STM. check goals    PT Home Exercise Plan Post op shoulder ROM HEP, desensitization techniques, compression bra (now radiation bra) and sleeve, supine scap. series, standing TB, Left UE MLD    Consulted and Agree with Plan of Care Patient           Patient will benefit from skilled therapeutic intervention in order to improve the following deficits and impairments:  Postural  dysfunction,Decreased range of motion,Impaired UE functional use,Pain,Decreased knowledge of precautions,Decreased scar mobility,Increased fascial restricitons,Increased edema,Impaired flexibility  Visit Diagnosis: Stiffness of left shoulder, not  elsewhere classified  Aftercare following surgery for neoplasm  Abnormal posture  Localized edema  Disorder of the skin and subcutaneous tissue related to radiation, unspecified  Malignant neoplasm of upper-outer quadrant of left breast in female, estrogen receptor positive ([place])     Problem List Patient Active Problem List   Diagnosis Date Noted  . Lymphedema of left arm 02/02/2021  . Drug-induced neutropenia ([place]) 09/30/2020  . Genetic testing 06/26/2020  . Family history of ovarian cancer   . Family history of prostate cancer   . Malignant neoplasm of upper-outer quadrant of left breast in female, estrogen receptor positive ([place]) 06/03/2020    Jeanne Evans 03/27/2021, 12:18 PM  [place], [place], [address] Phone: [phone]   Fax:  [phone]  Name: Jeanne Evans MRN: [id number] Date of Birth: 11-15-1986 Cheral Almas, PT 03/27/21 12:20 PM

## 2021-03-30 ENCOUNTER — Other Ambulatory Visit: Payer: Self-pay

## 2021-03-30 ENCOUNTER — Ambulatory Visit: Payer: 59

## 2021-03-30 DIAGNOSIS — R600 Localized edema: Secondary | ICD-10-CM

## 2021-03-30 DIAGNOSIS — Z483 Aftercare following surgery for neoplasm: Secondary | ICD-10-CM

## 2021-03-30 DIAGNOSIS — C50412 Malignant neoplasm of upper-outer quadrant of left female breast: Secondary | ICD-10-CM

## 2021-03-30 DIAGNOSIS — M25612 Stiffness of left shoulder, not elsewhere classified: Secondary | ICD-10-CM | POA: Diagnosis not present

## 2021-03-30 DIAGNOSIS — R293 Abnormal posture: Secondary | ICD-10-CM

## 2021-03-30 DIAGNOSIS — Z170 Estrogen receptor positive status [ER+]: Secondary | ICD-10-CM

## 2021-03-30 DIAGNOSIS — L599 Disorder of the skin and subcutaneous tissue related to radiation, unspecified: Secondary | ICD-10-CM

## 2021-03-30 NOTE — Therapy (Signed)
[place], [place], [address] Phone: [phone]   Fax:  [phone]  Physical Therapy Treatment  Patient Details  Name: Jeanne Evans MRN: [id number] Date of Birth: Feb 01, 1986 Referring Provider (PT): Dr. Erroll Luna   Encounter Date: 03/30/2021   PT End of Session - 03/30/21 1201    Visit Number 29    Number of Visits 38    Date for PT Re-Evaluation 05/03/21    PT Start Time 1110    PT Stop Time 1155    PT Time Calculation (min) 45 min    Activity Tolerance Patient tolerated treatment well    Behavior During Therapy [facility] for tasks assessed/performed           Past Medical History:  Diagnosis Date  . Breast cancer ([place])   . Family history of ovarian cancer   . Family history of prostate cancer   . GERD (gastroesophageal reflux disease)   . History of asthma    has albuterol, uses PRN  . History of heart murmur in childhood   . History of multiple allergies     Past Surgical History:  Procedure Laterality Date  . BREAST LUMPECTOMY WITH RADIOACTIVE SEED AND SENTINEL LYMPH NODE BIOPSY Left 11/19/2020   Procedure: LEFT BREAST LUMPECTOMY X 2 WITH RADIOACTIVE SEED AND SENTINEL LYMPH NODE MAPPING;  Surgeon: Erroll Luna, MD;  Location: [place];  Service: General;  Laterality: Left;  PECTORAL BLOCK  . PORTACATH PLACEMENT Right 06/18/2020   Procedure: INSERTION PORT-A-CATH WITH ULTRASOUND GUIDANCE;  Surgeon: Erroll Luna, MD;  Location: [place];  Service: General;  Laterality: Right;  . RE-EXCISION OF BREAST LUMPECTOMY Left 12/09/2020   Procedure: RE-EXCISION OF LEFT BREAST LUMPECTOMY;  Surgeon: Erroll Luna, MD;  Location: [place];  Service: General;  Laterality: Left;    There were no vitals filed for this visit.   Subjective Assessment - 03/30/21 1112    Subjective Pt continues to be compliant with sleeve and chip pack.  She  performed MLD to the left UE on Friday and Saturday, still can't open a jar.    Pertinent History Patient was diagnosed on 05/12/2020 with left breast cancer. She underwent neoadjuvant chemotherapy 06/19/2020-09/23/2020 and had a left lumpectomy and sentinel node biopsy (6 negative nodes) on 11/19/2020.  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    Patient Stated Goals Decrease pain and improve shoulder ROM    Currently in Pain? No/denies    Pain Score 0-No pain                             [facility] Adult PT Treatment/Exercise - 03/30/21 0001      Shoulder Exercises: Standing   External Rotation Strengthening;Both;10 reps    Theraband Level (Shoulder External Rotation) Level 2 (Red)    Retraction Strengthening;Both;12 reps    Theraband Level (Shoulder Retraction) Level 2 (Red)    Shoulder Elevation Limitations elbow ext red x 10 B    Other Standing Exercises elbow flex 2# x15    Other Standing Exercises Jobes flex and scaption 2 x 5      Manual Therapy   Soft tissue mobilization outside of radiation field with cocoa butter to pecs,posterior shoulder,  and upper traps    Myofascial Release left axillary region and upper arm    Manual Lymphatic Drainage (MLD) REview self MLD to left UE with  pt doing very well and requiring only occasional VC's    Passive ROM left shoulder flex, abd, IR and ER and D2 flex with VC's to relax                       PT Long Term Goals - 03/27/21 1205      PT LONG TERM GOAL #1   Title Patient will demonstrate she has regained full shoulder ROM and function post operatively compared to baselines.    Time 8    Period Weeks    Status On-going      PT LONG TERM GOAL #2   Title Patient will increase left shoulder active flexion to >/= 165 degrees for increased ease reaching.    Baseline 165    Time 8    Period Weeks    Status Achieved      PT LONG TERM GOAL #3   Title Patient will increase left shoulder active abduction  to >/= 165 degrees for increased ease reaching.    Time 8    Period Weeks    Status On-going      PT LONG TERM GOAL #4   Title Patient will improve her DASH score to be </= 20 for improved overall upper extremity function.    Time 8    Period Weeks    Status On-going      PT LONG TERM GOAL #5   Title Patient will report >/= 50% decrease in pain for increased tolerance of daily tasks.    Baseline 65% better    Time 8    Period Weeks    Status Achieved      PT LONG TERM GOAL #6   Title Pt will increase sit to stand in 30 seconds to 12 to indicate an improvement in functional strength    Time 8    Period Weeks    Status New                 Plan - 03/30/21 1202    Clinical Impression Statement Pt did very well return demonstrating technique for self MLD, but needed a little help with the sequence when not looking at written instructions.  Still tight through left pecs and lats.  We progressed to more strengthening today with pt demonstrating the most fatigue with biceps curls with 2 lbs, and fatigueing with 1# wts for jobes flex and scaption. Despite wearing chip pack consistently except at night she still has firm nodular areas at breast incision    Personal Factors and Comorbidities Comorbidity 3+    Comorbidities Left breast CA fuunctionally triple neg, chemo, radiation    Stability/Clinical Decision Making Stable/Uncomplicated    Rehab Potential Excellent    PT Frequency 2x / week    PT Duration 8 weeks    PT Treatment/Interventions ADLs/Self Care Home Management;Therapeutic exercise;Patient/family education;Passive range of motion;Manual lymph drainage;Manual techniques;Scar mobilization    PT Next Visit Plan Pt to continue wearing her sleeve on a daily basis until 1 month SOZO screen around June 7 secondary to pt 6.9 points above baseline today and was educated in self MLD to UE, Cont. MFR techniques, PROM, STM. progress strength    PT Home Exercise Plan Post op shoulder  ROM HEP, desensitization techniques, compression bra (now radiation bra) and sleeve, supine scap. series, standing TB, Left UE MLD    Consulted and Agree with Plan of Care Patient  Patient will benefit from skilled therapeutic intervention in order to improve the following deficits and impairments:  Postural dysfunction,Decreased range of motion,Impaired UE functional use,Pain,Decreased knowledge of precautions,Decreased scar mobility,Increased fascial restricitons,Increased edema,Impaired flexibility  Visit Diagnosis: Stiffness of left shoulder, not elsewhere classified  Aftercare following surgery for neoplasm  Abnormal posture  Localized edema  Disorder of the skin and subcutaneous tissue related to radiation, unspecified  Malignant neoplasm of upper-outer quadrant of left breast in female, estrogen receptor positive ([place])     Problem List Patient Active Problem List   Diagnosis Date Noted  . Lymphedema of left arm 02/02/2021  . Drug-induced neutropenia ([place]) 09/30/2020  . Genetic testing 06/26/2020  . Family history of ovarian cancer   . Family history of prostate cancer   . Malignant neoplasm of upper-outer quadrant of left breast in female, estrogen receptor positive ([place]) 06/03/2020    Claris Pong 03/30/2021, 12:06 PM  [place], [place], [address] Phone: [phone]   Fax:  [phone]  Name: Jeanne Evans MRN: [id number] Date of Birth: Sep 19, 1986  Cheral Almas, PT 03/30/21 12:07 PM

## 2021-04-04 NOTE — Progress Notes (Signed)
[place]  [phone] [phone]     ID: Jeanne Evans DOB: 07/18/1986  MR#: [id number]  KGU#:[id number]  Patient Care Team: [name], MD as PCP - General (Obstetrics and Gynecology) Rockwell Germany, RN as Oncology Nurse Navigator Mauro Kaufmann, RN as Oncology Nurse Navigator Erroll Luna, MD as Consulting Physician (General Surgery) Reginae Wolfrey, Virgie Dad, MD as Consulting Physician (Oncology) Kyung Rudd, MD as Consulting Physician (Radiation Oncology) Servando Salina, MD as Consulting Physician (Obstetrics and Gynecology) Chauncey Cruel, MD OTHER MD:  CHIEF COMPLAINT: 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   Since her last visit, she completed adjuvant radiation on 03/10/2021.  Repeat prognostic panel was requested on the final surgical sample showing: estrogen receptor <1% negative with weak staining intensity, progesterone receptor <1% negative with moderate staining intensity; Her2 equivocal by immunohistochemistry (2+), but negative by FISH.  She is here to discuss pembrolizumab   REVIEW OF SYSTEMS: Jeanne Evans is working part-time.  Even that makes her extremely fatigued.  She has pain in the left arm medially, and some numbness in the axilla.  There is some left upper extremity lymphedema.  She has a persistent cough that keeps her up at night.  She has tried antihistamines without any success.  She also continues to have taste perversion and a poor appetite.  Aside from these issues a detailed review of systems was stable   COVID 19 VACCINATION STATUS: [place] x2, most recently 01/2021   HISTORY OF CURRENT ILLNESS: From the original intake note:  Jeanne Evans herself palpated a painful left breast lump. She underwent bilateral diagnostic mammography with  tomography and left breast ultrasonography at [facility] on 05/12/2020 showing: breast density category B; palpable 2.7 cm oval hypoechoic mass in left breast at 2-3 o'clock; 2.2 cm left axillary lymph node/grouping of nodes.  Accordingly on 05/29/2020 she proceeded to biopsy of the left breast area in question. The pathology from this procedure ([id number]) showed: invasive ductal carcinoma, grade 3. Prognostic indicators significant for: estrogen receptor, 10% positive with weak staining intensity and progesterone receptor, 0% negative. Proliferation marker Ki67 at 95%. HER2 equivocal by immunohistochemistry (2+), but negative by fluorescent in situ hybridization with a signals ratio 1.77 and number per cell 2.65.  Biopsy of the left axillary lymph node in question was negative and concordant  The patient's subsequent history is as detailed below.   PAST MEDICAL HISTORY: Past Medical History:  Diagnosis Date  . Breast cancer ([place])   . Family history of ovarian cancer   . Family history of prostate cancer   . GERD (gastroesophageal reflux disease)   . History of asthma    has albuterol, uses PRN  . History of heart murmur in childhood   . History of multiple allergies   History of allergy related headaches   PAST SURGICAL HISTORY: Past Surgical History:  Procedure Laterality Date  . BREAST LUMPECTOMY WITH RADIOACTIVE SEED AND SENTINEL LYMPH NODE BIOPSY Left 11/19/2020   Procedure: LEFT BREAST LUMPECTOMY X 2 WITH RADIOACTIVE SEED AND SENTINEL LYMPH NODE MAPPING;  Surgeon: Erroll Luna, MD;  Location: [place];  Service: General;  Laterality: Left;  PECTORAL BLOCK  . PORTACATH PLACEMENT Right 06/18/2020   Procedure: INSERTION PORT-A-CATH WITH ULTRASOUND GUIDANCE;  Surgeon: Erroll Luna, MD;  Location: [place];  Service: General;  Laterality: Right;  .  RE-EXCISION OF BREAST LUMPECTOMY Left 12/09/2020   Procedure: RE-EXCISION OF LEFT BREAST  LUMPECTOMY;  Surgeon: Erroll Luna, MD;  Location: [place];  Service: General;  Laterality: Left;  She reports having a boil lanced from her perineal area   FAMILY HISTORY: Family History  Problem Relation Age of Onset  . Lung cancer Maternal Uncle        dx late 22s  . Prostate cancer Paternal Uncle        dx late 92s  . Ovarian cancer Paternal Grandmother        dx 69s  . Prostate cancer Paternal Grandfather        dx 76s  . Prostate cancer Paternal Uncle        dx early 76s  The patient's father is 55 and her mother 52, as of 05/2020.  The patient has two brothers and one sister. She reports ovarian cancer in her paternal grandmother, prostate cancer in her paternal grandfather and a paternal uncle, and lung cancer in a maternal uncle.   GYNECOLOGIC HISTORY:  No LMP recorded. (Menstrual status: IUD). Menarche: 35 years old Age at first live birth: 35 years old [place] P 2 LMP current, experiences spotting Contraceptive: Mirena IUD in place HRT n/a  Hysterectomy? no BSO? 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 She describes herself as single. She lives at home with her children, Jeanne Evans (age 10) and Jeanne Evans (age 18).  She is originally from [place] her home.    ADVANCED DIRECTIVES: Not in place. She intends to name her mother, Jeanne Evans, as her HCPOA.  Jeanne Evans lives in [place] and can be reached at [phone].  The patient was given the appropriate documents to complete and notarized at her discretion at the time of her visit 06/04/2020   HEALTH MAINTENANCE: Social History   Tobacco Use  . Smoking status: Never Smoker  . Smokeless tobacco: Never Used  Substance Use Topics  . Alcohol use: Yes    Comment: occas  . Drug use: Yes    Types: Marijuana     Colonoscopy: n/a (age)  PAP: 04/2020  Bone density: n/a  (age)   Allergies  Allergen Reactions  . Sulfa Antibiotics Hives    Current Outpatient Medications  Medication Sig Dispense Refill  . benzonatate (TESSALON) 100 MG capsule Take 1 capsule (100 mg total) by mouth 2 (two) times daily as needed for cough. 60 capsule 1  . Acetaminophen (TYLENOL) 325 MG CAPS Take by mouth.    . capecitabine (XELODA) 500 MG tablet TAKE 2 TABLETS (1,000 MG TOTAL) BY MOUTH 2 (TWO) TIMES DAILY AFTER A MEAL. TAKE ONLY ON RADIATION DAYS. START MONDAY 01/26/2021 45 tablet 0  . gabapentin (NEURONTIN) 300 MG capsule Take 1 capsule (300 mg total) by mouth at bedtime. 90 capsule 4  . ibuprofen (ADVIL) 800 MG tablet Take 1 tablet (800 mg total) by mouth every 8 (eight) hours as needed. 30 tablet 0  . levonorgestrel (MIRENA) 20 MCG/24HR IUD 1 each by Intrauterine route once.    . lidocaine-prilocaine (EMLA) cream Apply 1 application topically as needed. 30 g 0  . magic mouthwash SOLN Take 5 mLs by mouth 4 (four) times daily as needed for mouth pain. 240 mL 1  . naproxen sodium (ALEVE) 220 MG tablet Take 220 mg by mouth. (Patient not taking: Reported on 02/12/2021)    .  omeprazole (PRILOSEC) 40 MG capsule Take 1 capsule (40 mg total) by mouth at bedtime. 30 capsule 3  . venlafaxine XR (EFFEXOR-XR) 75 MG 24 hr capsule Take 1 capsule (75 mg total) by mouth daily with breakfast. 90 capsule 4   No current facility-administered medications for this visit.    OBJECTIVE: African-American woman who appears stated age 35:   04/06/21 1458  BP: 132/79  Pulse: 75  Resp: 17  Temp: (!) 97.5 F (36.4 C)  SpO2: 100%     Body mass index is 32.29 kg/m.   Wt Readings from Last 3 Encounters:  04/06/21 182 lb 4.8 oz (82.7 kg)  03/09/21 183 lb 8 oz (83.2 kg)  02/12/21 183 lb 12.8 oz (83.4 kg)  ECOG FS:1 - Symptomatic but completely ambulatory  Sclerae unicteric, EOMs intact Wearing a mask No cervical or supraclavicular adenopathy Lungs no rales or rhonchi Heart regular rate  and rhythm Abd soft, nontender, positive bowel sounds MSK no focal spinal tenderness, no upper extremity lymphedema Neuro: nonfocal, well oriented, appropriate affect Breasts: The right breast is unremarkable.  The left breast is status post lumpectomy and radiation, with good cosmetic result.  The hyperpigmentation is beginning to fade.  Both axillae are benign.   LAB RESULTS:  CMP     Component Value Date/Time   NA 142 04/06/2021 1433   K 3.7 04/06/2021 1433   CL 111 04/06/2021 1433   CO2 27 04/06/2021 1433   GLUCOSE 103 (H) 04/06/2021 1433   BUN 16 04/06/2021 1433   CREATININE 0.97 04/06/2021 1433   CREATININE 0.92 06/04/2020 0830   CALCIUM 9.3 04/06/2021 1433   PROT 7.4 04/06/2021 1433   ALBUMIN 4.0 04/06/2021 1433   AST 18 04/06/2021 1433   AST 13 (L) 06/04/2020 0830   ALT 18 04/06/2021 1433   ALT 14 06/04/2020 0830   ALKPHOS 73 04/06/2021 1433   BILITOT 0.6 04/06/2021 1433   BILITOT 0.5 06/04/2020 0830   GFRNONAA >60 04/06/2021 1433   GFRNONAA >60 06/04/2020 0830   GFRAA >60 08/05/2020 0955   GFRAA >60 06/04/2020 0830    No results found for: TOTALPROTELP, ALBUMINELP, A1GS, A2GS, BETS, BETA2SER, GAMS, MSPIKE, SPEI  Lab Results  Component Value Date   WBC 3.6 (L) 04/06/2021   NEUTROABS 1.8 04/06/2021   HGB 11.7 (L) 04/06/2021   HCT 34.6 (L) 04/06/2021   MCV 87.8 04/06/2021   PLT 256 04/06/2021    No results found for: LABCA2  No components found for: HDIXBO478  No results for input(s): INR in the last 168 hours.  No results found for: LABCA2  No results found for: SXQ820  No results found for: SHN887  No results found for: JLL974  No results found for: CA2729  No components found for: HGQUANT  No results found for: CEA1 / No results found for: CEA1   No results found for: AFPTUMOR  No results found for: CHROMOGRNA  No results found for: KPAFRELGTCHN, LAMBDASER, KAPLAMBRATIO (kappa/lambda light chains)  No results found for: HGBA,  HGBA2QUANT, HGBFQUANT, HGBSQUAN (Hemoglobinopathy evaluation)   No results found for: LDH  No results found for: IRON, TIBC, IRONPCTSAT (Iron and TIBC)  No results found for: FERRITIN  Urinalysis No results found for: COLORURINE, APPEARANCEUR, LABSPEC, PHURINE, GLUCOSEU, HGBUR, BILIRUBINUR, KETONESUR, PROTEINUR, UROBILINOGEN, NITRITE, LEUKOCYTESUR   STUDIES: No results found.   ELIGIBLE FOR AVAILABLE RESEARCH PROTOCOL: no  ASSESSMENT: 35 y.o. Impact woman status post left breast upper outer quadrant biopsy 05/29/2020 for a clinical T2 N0,  stage IIB invasive ductal carcinoma, functionally triple negative, with an MIB-1 of 95%.  (1) genetics testing 06/23/2020 through the [facility] Multi-Cancer Panel found no deleterious mutations in AIP, ALK, APC, ATM, AXIN2,BAP1,  BARD1, BLM, BMPR1A, BRCA1, BRCA2, BRIP1, CASR, CDC73, CDH1, CDK4, CDKN1B, CDKN1C, CDKN2A (p14ARF), CDKN2A (p16INK4a), CEBPA, CHEK2, CTNNA1, DICER1, DIS3L2, EGFR (c.2369C>T, p.Thr790Met variant only), EPCAM (Deletion/duplication testing only), FH, FLCN, GATA2, GPC3, GREM1 (Promoter region deletion/duplication testing only), HOXB13 (c.251G>A, p.Gly84Glu), HRAS, KIT, MAX, MEN1, MET, MITF (c.952G>A, p.Glu318Lys variant only), MLH1, MSH2, MSH3, MSH6, MUTYH, NBN, NF1, NF2, NTHL1, PALB2, PDGFRA, PHOX2B, PMS2, POLD1, POLE, POT1, PRKAR1A, PTCH1, PTEN, RAD50, RAD51C, RAD51D, RB1, RECQL4, RET, RNF43, RUNX1, SDHAF2, SDHA (sequence changes only), SDHB, SDHC, SDHD, SMAD4, SMARCA4, SMARCB1, SMARCE1, STK11, SUFU, TERC, TERT, TMEM127, TP53, TSC1, TSC2, VHL, WRN and WT1.   (2) neoadjuvant chemotherapy consisting of cyclophosphamide and doxorubicin in dose dense fashion x4 started 06/19/2020, completed 08/05/2020, followed by paclitaxel and carboplatin weekly x12 starting 08/26/2020, completing 4 doses 09/23/2020  (a) echocardiogram on 06/13/2020 showed an EF of 60-65%  (b) paclitaxel and carboplatin discontinued after 4 doses because of neuropathy  and cytopenias  (3) status post left lumpectomy and sentinel lymph node sampling 11/19/2020 for a residual yp T1c ypN0 invasive ductal carcinoma involving the superior margin  (a) additional surgery 12/09/2020 cleared the margin in question  (b) repeat prognostic panel confirms triple negative disease  (4) postmastectomy radiation started 01/22/2021:   (a) receiving capecitabine sensitization, started 01/26/2021  (5) to start pembrolizumab/Keytruda after the completion of radiation   PLAN: Nailah completed her radiation treatments about 2 months ago but she is still recovering from them.  Mostly what she is feeling is fatigue.  Unfortunately this type of fatigue the more breaks she takes the longer it will take to resolve so it is a good thing that she is able to go to work even part-time and the more active she is the more quickly the symptoms will resolve.  She did bring up some FMLA papers today which we will be glad to help her complete.  We discussed increasing the dose of capecitabine and continuing another 4 months.  She did very poorly with this medication of low doses and I do not think she really would be able to tolerate that.  Accordingly we are going to bypass the potential 6 months of capecitabine and move onto pembrolizumab.  She has a good understanding of the possible toxicities side effects and complications of this agent which is of course not a form of immunotherapy but a way of stimulating her immune system so that it may learn to attack her cancer.  This has been found to be useful in triple negative cases like hers.  Tentatively we would be starting in the second half of September by which time I hope her functional status will have sufficiently recovered  I am going to see her with her second dose to make sure there are no unusual side effects  Total encounter time 35 minutes.Jeanne Jews C. Staley Lunz, MD 04/06/21 5:08 PM Medical Oncology and Hematology [facility] [place], [place] [address] Tel. [phone]    Fax. [phone]   I, Wilburn Mylar, am acting as scribe for Dr. Virgie Dad. Jeanne Evans.  I, Lurline Del MD, have reviewed the above documentation for accuracy and completeness, and I agree with the above.   *Total Encounter Time as defined by the Centers for Medicare and Medicaid Services includes,  in addition to the face-to-face time of a patient visit (documented in the note above) non-face-to-face time: obtaining and reviewing outside history, ordering and reviewing medications, tests or procedures, care coordination (communications with other health care professionals or caregivers) and documentation in the medical record.

## 2021-04-06 ENCOUNTER — Inpatient Hospital Stay: Payer: 59 | Attending: Oncology

## 2021-04-06 ENCOUNTER — Other Ambulatory Visit: Payer: Self-pay

## 2021-04-06 ENCOUNTER — Inpatient Hospital Stay (HOSPITAL_BASED_OUTPATIENT_CLINIC_OR_DEPARTMENT_OTHER): Payer: 59 | Admitting: Oncology

## 2021-04-06 VITALS — BP 132/79 | HR 75 | Temp 97.5°F | Resp 17 | Wt 182.3 lb

## 2021-04-06 DIAGNOSIS — Z801 Family history of malignant neoplasm of trachea, bronchus and lung: Secondary | ICD-10-CM | POA: Diagnosis not present

## 2021-04-06 DIAGNOSIS — Z170 Estrogen receptor positive status [ER+]: Secondary | ICD-10-CM

## 2021-04-06 DIAGNOSIS — Z793 Long term (current) use of hormonal contraceptives: Secondary | ICD-10-CM | POA: Insufficient documentation

## 2021-04-06 DIAGNOSIS — Z79899 Other long term (current) drug therapy: Secondary | ICD-10-CM | POA: Diagnosis not present

## 2021-04-06 DIAGNOSIS — Z171 Estrogen receptor negative status [ER-]: Secondary | ICD-10-CM

## 2021-04-06 DIAGNOSIS — Z8042 Family history of malignant neoplasm of prostate: Secondary | ICD-10-CM | POA: Diagnosis not present

## 2021-04-06 DIAGNOSIS — Z8041 Family history of malignant neoplasm of ovary: Secondary | ICD-10-CM | POA: Insufficient documentation

## 2021-04-06 DIAGNOSIS — Z95828 Presence of other vascular implants and grafts: Secondary | ICD-10-CM

## 2021-04-06 DIAGNOSIS — C50412 Malignant neoplasm of upper-outer quadrant of left female breast: Secondary | ICD-10-CM | POA: Diagnosis present

## 2021-04-06 DIAGNOSIS — Z452 Encounter for adjustment and management of vascular access device: Secondary | ICD-10-CM | POA: Diagnosis not present

## 2021-04-06 LAB — CBC WITH DIFFERENTIAL/PLATELET
Abs Immature Granulocytes: 0.01 10*3/uL (ref 0.00–0.07)
Basophils Absolute: 0 10*3/uL (ref 0.0–0.1)
Basophils Relative: 1 %
Eosinophils Absolute: 0.3 10*3/uL (ref 0.0–0.5)
Eosinophils Relative: 8 %
HCT: 34.6 % — ABNORMAL LOW (ref 36.0–46.0)
Hemoglobin: 11.7 g/dL — ABNORMAL LOW (ref 12.0–15.0)
Immature Granulocytes: 0 %
Lymphocytes Relative: 29 %
Lymphs Abs: 1 10*3/uL (ref 0.7–4.0)
MCH: 29.7 pg (ref 26.0–34.0)
MCHC: 33.8 g/dL (ref 30.0–36.0)
MCV: 87.8 fL (ref 80.0–100.0)
Monocytes Absolute: 0.4 10*3/uL (ref 0.1–1.0)
Monocytes Relative: 12 %
Neutro Abs: 1.8 10*3/uL (ref 1.7–7.7)
Neutrophils Relative %: 50 %
Platelets: 256 10*3/uL (ref 150–400)
RBC: 3.94 MIL/uL (ref 3.87–5.11)
RDW: 17.8 % — ABNORMAL HIGH (ref 11.5–15.5)
WBC: 3.6 10*3/uL — ABNORMAL LOW (ref 4.0–10.5)
nRBC: 0 % (ref 0.0–0.2)

## 2021-04-06 LAB — COMPREHENSIVE METABOLIC PANEL
ALT: 18 U/L (ref 0–44)
AST: 18 U/L (ref 15–41)
Albumin: 4 g/dL (ref 3.5–5.0)
Alkaline Phosphatase: 73 U/L (ref 38–126)
Anion gap: 4 — ABNORMAL LOW (ref 5–15)
BUN: 16 mg/dL (ref 6–20)
CO2: 27 mmol/L (ref 22–32)
Calcium: 9.3 mg/dL (ref 8.9–10.3)
Chloride: 111 mmol/L (ref 98–111)
Creatinine, Ser: 0.97 mg/dL (ref 0.44–1.00)
GFR, Estimated: >60 mL/min (ref >60)
Glucose, Bld: 103 mg/dL — ABNORMAL HIGH (ref 70–99)
Potassium: 3.7 mmol/L (ref 3.5–5.1)
Sodium: 142 mmol/L (ref 135–145)
Total Bilirubin: 0.6 mg/dL (ref 0.3–1.2)
Total Protein: 7.4 g/dL (ref 6.5–8.1)

## 2021-04-06 MED ORDER — SODIUM CHLORIDE 0.9% FLUSH
10.0000 mL | INTRAVENOUS | Status: DC | PRN
  Administered 2021-04-06: 10 mL via INTRAVENOUS
  Filled 2021-04-06: qty 10

## 2021-04-06 MED ORDER — HEPARIN SOD (PORK) LOCK FLUSH 100 UNIT/ML IV SOLN
500.0000 [IU] | Freq: Once | INTRAVENOUS | Status: DC
  Filled 2021-04-06: qty 5

## 2021-04-06 MED ORDER — BENZONATATE 100 MG PO CAPS: 100.0000 mg | ORAL_CAPSULE | Freq: Two times a day (BID) | ORAL | 1 refills | Status: DC | PRN

## 2021-04-06 MED ORDER — LIDOCAINE-PRILOCAINE 2.5-2.5 % EX CREA: TOPICAL_CREAM | CUTANEOUS | 3 refills | Status: DC

## 2021-04-06 NOTE — Patient Instructions (Signed)
Implanted Port Insertion, Care After This sheet gives you information about how to care for yourself after your procedure. Your health care provider may also give you more specific instructions. If you have problems or questions, contact your health care provider. What can I expect after the procedure? After the procedure, it is common to have:  Discomfort at the port insertion site.  Bruising on the skin over the port. This should improve over 3-4 days. Follow these instructions at home: Port care  After your port is placed, you will get a manufacturer's information card. The card has information about your port. Keep this card with you at all times.  Take care of the port as told by your health care provider. Ask your health care provider if you or a family member can get training for taking care of the port at home. A home health care nurse may also take care of the port.  Make sure to remember what type of port you have. Incision care  Follow instructions from your health care provider about how to take care of your port insertion site. Make sure you: ? Wash your hands with soap and water before and after you change your bandage (dressing). If soap and water are not available, use hand sanitizer. ? Change your dressing as told by your health care provider. ? Leave stitches (sutures), skin glue, or adhesive strips in place. These skin closures may need to stay in place for 2 weeks or longer. If adhesive strip edges start to loosen and curl up, you may trim the loose edges. Do not remove adhesive strips completely unless your health care provider tells you to do that.  Check your port insertion site every day for signs of infection. Check for: ? Redness, swelling, or pain. ? Fluid or blood. ? Warmth. ? Pus or a bad smell.      Activity  Return to your normal activities as told by your health care provider. Ask your health care provider what activities are safe for you.  Do not  lift anything that is heavier than 10 lb (4.5 kg), or the limit that you are told, until your health care provider says that it is safe. General instructions  Take over-the-counter and prescription medicines only as told by your health care provider.  Do not take baths, swim, or use a hot tub until your health care provider approves. Ask your health care provider if you may take showers. You may only be allowed to take sponge baths.  Do not drive for 24 hours if you were given a sedative during your procedure.  Wear a medical alert bracelet in case of an emergency. This will tell any health care providers that you have a port.  Keep all follow-up visits as told by your health care provider. This is important. Contact a health care provider if:  You cannot flush your port with saline as directed, or you cannot draw blood from the port.  You have a fever or chills.  You have redness, swelling, or pain around your port insertion site.  You have fluid or blood coming from your port insertion site.  Your port insertion site feels warm to the touch.  You have pus or a bad smell coming from the port insertion site. Get help right away if:  You have chest pain or shortness of breath.  You have bleeding from your port that you cannot control. Summary  Take care of the port as told by your   health care provider. Keep the manufacturer's information card with you at all times.  Change your dressing as told by your health care provider.  Contact a health care provider if you have a fever or chills or if you have redness, swelling, or pain around your port insertion site.  Keep all follow-up visits as told by your health care provider. This information is not intended to replace advice given to you by your health care provider. Make sure you discuss any questions you have with your health care provider. Document Revised: 05/30/2018 Document Reviewed: 05/30/2018 Elsevier Patient Education   2021 Elsevier Inc.  

## 2021-04-07 ENCOUNTER — Encounter: Payer: Self-pay | Admitting: *Deleted

## 2021-04-07 ENCOUNTER — Ambulatory Visit: Payer: 59

## 2021-04-07 DIAGNOSIS — R293 Abnormal posture: Secondary | ICD-10-CM

## 2021-04-07 DIAGNOSIS — M25612 Stiffness of left shoulder, not elsewhere classified: Secondary | ICD-10-CM

## 2021-04-07 DIAGNOSIS — L599 Disorder of the skin and subcutaneous tissue related to radiation, unspecified: Secondary | ICD-10-CM

## 2021-04-07 DIAGNOSIS — Z170 Estrogen receptor positive status [ER+]: Secondary | ICD-10-CM

## 2021-04-07 DIAGNOSIS — Z483 Aftercare following surgery for neoplasm: Secondary | ICD-10-CM

## 2021-04-07 DIAGNOSIS — R600 Localized edema: Secondary | ICD-10-CM

## 2021-04-07 DIAGNOSIS — C50412 Malignant neoplasm of upper-outer quadrant of left female breast: Secondary | ICD-10-CM

## 2021-04-07 NOTE — Therapy (Signed)
[place], [place], [address] Phone: [phone]   Fax:  [phone]  Physical Therapy Treatment  Patient Details  Name: Jeanne Evans MRN: [id number] Date of Birth: 1986/08/02 Referring Provider (PT): Dr. Erroll Luna   Encounter Date: 04/07/2021   PT End of Session - 04/07/21 1056    Visit Number 30    Number of Visits 38    Date for PT Re-Evaluation 05/03/21    PT Start Time 1006    PT Stop Time 1103    PT Time Calculation (min) 57 min    Activity Tolerance Patient tolerated treatment well    Behavior During Therapy [facility] for tasks assessed/performed           Past Medical History:  Diagnosis Date  . Breast cancer ([place])   . Family history of ovarian cancer   . Family history of prostate cancer   . GERD (gastroesophageal reflux disease)   . History of asthma    has albuterol, uses PRN  . History of heart murmur in childhood   . History of multiple allergies     Past Surgical History:  Procedure Laterality Date  . BREAST LUMPECTOMY WITH RADIOACTIVE SEED AND SENTINEL LYMPH NODE BIOPSY Left 11/19/2020   Procedure: LEFT BREAST LUMPECTOMY X 2 WITH RADIOACTIVE SEED AND SENTINEL LYMPH NODE MAPPING;  Surgeon: Erroll Luna, MD;  Location: [place];  Service: General;  Laterality: Left;  PECTORAL BLOCK  . PORTACATH PLACEMENT Right 06/18/2020   Procedure: INSERTION PORT-A-CATH WITH ULTRASOUND GUIDANCE;  Surgeon: Erroll Luna, MD;  Location: [place];  Service: General;  Laterality: Right;  . RE-EXCISION OF BREAST LUMPECTOMY Left 12/09/2020   Procedure: RE-EXCISION OF LEFT BREAST LUMPECTOMY;  Surgeon: Erroll Luna, MD;  Location: [place];  Service: General;  Laterality: Left;    There were no vitals filed for this visit.   Subjective Assessment - 04/07/21 1006    Subjective I am really tired today.  I had company all weekend and they  just left yesterday.  I was  wearing the chip pack and the nodules are better but  I got a silicone implant and the lump is much better.  It continues to get better.  I have been doing the MLD to my arm and breast.  The back of my arm is still really numb especially when it is cold.    Pertinent History Patient was diagnosed on 05/12/2020 with left breast cancer. She underwent neoadjuvant chemotherapy 06/19/2020-09/23/2020 and had a left lumpectomy and sentinel node biopsy (6 negative nodes) on 11/19/2020.  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    Patient Stated Goals Decrease pain and improve shoulder ROM    Currently in Pain? No/denies    Pain Score 0-No pain    Multiple Pain Sites No                             [facility] Adult PT Treatment/Exercise - 04/07/21 0001      Shoulder Exercises: Supine   External Rotation Strengthening;Both;10 reps    Diagonals Strengthening;Left;10 reps      Shoulder Exercises: Standing   Extension Strengthening;Both;10 reps    Theraband Level (Shoulder Extension) Level 2 (Red)    Retraction Strengthening;Both;12 reps    Theraband Level (Shoulder Retraction) Level 2 (Red)    Shoulder Elevation Limitations elbow ext red x 10 B  Other Standing Exercises elbow flex red x 10 Bilaterally      Manual Therapy   Soft tissue mobilization cocoa butter to pecs,posterior shoulder, interscapular region and upper traps    Myofascial Release left axillary region and upper arm    Manual Lymphatic Drainage (MLD) Mld to supraclavicular, right axillary and left axillary LN,Anterior interaxillary pathway, left axillo-inguinal pathway and left UE starting proximally and working distally on the arm, retracing all steps    Passive ROM left shoulder flex, abd, IR and ER and D2 flex with VC's to relax                       PT Long Term Goals - 03/27/21 1205      PT LONG TERM GOAL #1   Title Patient will demonstrate she has regained full  shoulder ROM and function post operatively compared to baselines.    Time 8    Period Weeks    Status On-going      PT LONG TERM GOAL #2   Title Patient will increase left shoulder active flexion to >/= 165 degrees for increased ease reaching.    Baseline 165    Time 8    Period Weeks    Status Achieved      PT LONG TERM GOAL #3   Title Patient will increase left shoulder active abduction to >/= 165 degrees for increased ease reaching.    Time 8    Period Weeks    Status On-going      PT LONG TERM GOAL #4   Title Patient will improve her DASH score to be </= 20 for improved overall upper extremity function.    Time 8    Period Weeks    Status On-going      PT LONG TERM GOAL #5   Title Patient will report >/= 50% decrease in pain for increased tolerance of daily tasks.    Baseline 65% better    Time 8    Period Weeks    Status Achieved      PT LONG TERM GOAL #6   Title Pt will increase sit to stand in 30 seconds to 12 to indicate an improvement in functional strength    Time 8    Period Weeks    Status New                 Plan - 04/07/21 1213    Clinical Impression Statement Pt notes decreased breast swelling overall, and she is compliant with MLD to her breast and left UE.  She has been wearing a silicone breast insert and feels this has helped the area of nodularity at her incision, and it may be slightly improved but is still quite large. She is still quite tender at the axillary border of pecs and her lats.  She had a new onset of right shoulder pain over the weekend and we held supine horizontal abd secondary to right shoulder pain.  She had good tolerance for remaining exercises.  We discussed pt getting a new sleeve secondary to present sleeve sliding down and now has a hole in it.    Personal Factors and Comorbidities Comorbidity 3+    Comorbidities Left breast CA fuunctionally triple neg, chemo, radiation    Stability/Clinical Decision Making  Stable/Uncomplicated    Rehab Potential Excellent    PT Frequency 2x / week    PT Duration 8 weeks    PT Treatment/Interventions ADLs/Self Care Home  Management;Therapeutic exercise;Patient/family education;Passive range of motion;Manual lymph drainage;Manual techniques;Scar mobilization    PT Next Visit Plan Pt to continue wearing her sleeve on a daily basis until 1 month SOZO screen around June 7 secondary to pt 6.9 points above baseline today and was educated in self MLD to UE, Cont. MFR techniques, PROM, STM. progress strength    PT Home Exercise Plan Post op shoulder ROM HEP, desensitization techniques, compression bra (now radiation bra) and sleeve, supine scap. series, standing TB, Left UE MLD    Consulted and Agree with Plan of Care Patient           Patient will benefit from skilled therapeutic intervention in order to improve the following deficits and impairments:  Postural dysfunction,Decreased range of motion,Impaired UE functional use,Pain,Decreased knowledge of precautions,Decreased scar mobility,Increased fascial restricitons,Increased edema,Impaired flexibility  Visit Diagnosis: Stiffness of left shoulder, not elsewhere classified  Aftercare following surgery for neoplasm  Abnormal posture  Localized edema  Disorder of the skin and subcutaneous tissue related to radiation, unspecified  Malignant neoplasm of upper-outer quadrant of left breast in female, estrogen receptor positive ([place])     Problem List Patient Active Problem List   Diagnosis Date Noted  . Lymphedema of left arm 02/02/2021  . Drug-induced neutropenia ([place]) 09/30/2020  . Genetic testing 06/26/2020  . Family history of ovarian cancer   . Family history of prostate cancer   . Malignant neoplasm of upper-outer quadrant of left breast in female, estrogen receptor negative ([place]) 06/03/2020    Claris Pong 04/07/2021, 12:18 PM  [place], [place], [address] Phone: [phone]   Fax:  [phone]  Name: Jeanne Evans MRN: [id number] Date of Birth: [birth year] Cheral Almas, PT 04/07/21 12:19 PM

## 2021-04-08 ENCOUNTER — Other Ambulatory Visit (HOSPITAL_COMMUNITY): Payer: Self-pay

## 2021-04-10 ENCOUNTER — Ambulatory Visit: Payer: 59

## 2021-04-10 ENCOUNTER — Other Ambulatory Visit: Payer: Self-pay

## 2021-04-10 ENCOUNTER — Encounter: Payer: Self-pay | Admitting: *Deleted

## 2021-04-10 DIAGNOSIS — M25612 Stiffness of left shoulder, not elsewhere classified: Secondary | ICD-10-CM

## 2021-04-10 DIAGNOSIS — Z170 Estrogen receptor positive status [ER+]: Secondary | ICD-10-CM

## 2021-04-10 DIAGNOSIS — C50412 Malignant neoplasm of upper-outer quadrant of left female breast: Secondary | ICD-10-CM

## 2021-04-10 DIAGNOSIS — L599 Disorder of the skin and subcutaneous tissue related to radiation, unspecified: Secondary | ICD-10-CM

## 2021-04-10 DIAGNOSIS — R600 Localized edema: Secondary | ICD-10-CM

## 2021-04-10 DIAGNOSIS — R293 Abnormal posture: Secondary | ICD-10-CM

## 2021-04-10 DIAGNOSIS — Z483 Aftercare following surgery for neoplasm: Secondary | ICD-10-CM

## 2021-04-10 NOTE — Therapy (Signed)
[place], [place], [address] Phone: [phone]   Fax:  [phone]  Physical Therapy Treatment  Patient Details  Name: Jeanne Evans MRN: [id number] Date of Birth: Jun 21, 1986 Referring Provider (PT): Dr. Erroll Luna   Encounter Date: 04/10/2021   PT End of Session - 04/10/21 1456    Visit Number 31    Number of Visits 38    Date for PT Re-Evaluation 05/03/21    PT Start Time 1110    PT Stop Time 1150    PT Time Calculation (min) 40 min    Activity Tolerance Treatment limited secondary to medical complications (Comment) Pt. Became lightheaded and treatment was stopped          Past Medical History:  Diagnosis Date  . Breast cancer ([place])   . Family history of ovarian cancer   . Family history of prostate cancer   . GERD (gastroesophageal reflux disease)   . History of asthma    has albuterol, uses PRN  . History of heart murmur in childhood   . History of multiple allergies     Past Surgical History:  Procedure Laterality Date  . BREAST LUMPECTOMY WITH RADIOACTIVE SEED AND SENTINEL LYMPH NODE BIOPSY Left 11/19/2020   Procedure: LEFT BREAST LUMPECTOMY X 2 WITH RADIOACTIVE SEED AND SENTINEL LYMPH NODE MAPPING;  Surgeon: Erroll Luna, MD;  Location: [facility];  Service: General;  Laterality: Left;  PECTORAL BLOCK  . PORTACATH PLACEMENT Right 06/18/2020   Procedure: INSERTION PORT-A-CATH WITH ULTRASOUND GUIDANCE;  Surgeon: Erroll Luna, MD;  Location: [facility];  Service: General;  Laterality: Right;  . RE-EXCISION OF BREAST LUMPECTOMY Left 12/09/2020   Procedure: RE-EXCISION OF LEFT BREAST LUMPECTOMY;  Surgeon: Erroll Luna, MD;  Location: [facility];  Service: General;  Laterality: Left;    There were no vitals filed for this visit.   Subjective Assessment - 04/10/21 1111    Subjective I didn't work yesterday because I was so  drained.  Felt better this am.  My shoulders and neck were hurting me yesterday but my daughter rubbed me down with vics. The colder temperatures makes the arm numbness feel worse,    Pertinent History Patient was diagnosed on 05/12/2020 with left breast cancer. She underwent neoadjuvant chemotherapy 06/19/2020-09/23/2020 and had a left lumpectomy and sentinel node biopsy (6 negative nodes) on 11/19/2020.  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    Currently in Pain? No/denies    Pain Score 0-No pain                 LYMPHEDEMA/ONCOLOGY QUESTIONNAIRE - 04/10/21 0001      Left Upper Extremity Lymphedema   15 cm Proximal to Olecranon Process 35.5 cm    10 cm Proximal to Olecranon Process 34.7 cm    Olecranon Process 27.4 cm    10 cm Proximal to Ulnar Styloid Process 20.1 cm                      [facility] Adult PT Treatment/Exercise - 04/10/21 0001      Shoulder Exercises: Standing   Other Standing Exercises elbow flex 3 # 2 x 10    Other Standing Exercises jobes flexion and scaption x 5 ea   pt started to feel light headed and treatment was stopped.     Shoulder Exercises: Pulleys   Flexion 2 minutes    ABduction 2 minutes  Shoulder Exercises: Therapy Ball   ABduction 10 reps      Manual Therapy   Manual Lymphatic Drainage (MLD) Reviewed all left UE and left breast MLD. Pt required occasional verbal cues for inner to outer upper arm, and left breast but was able to correct easily.                       PT Long Term Goals - 03/27/21 1205      PT LONG TERM GOAL #1   Title Patient will demonstrate she has regained full shoulder ROM and function post operatively compared to baselines.    Time 8    Period Weeks    Status On-going      PT LONG TERM GOAL #2   Title Patient will increase left shoulder active flexion to >/= 165 degrees for increased ease reaching.    Baseline 165    Time 8    Period Weeks    Status Achieved      PT LONG  TERM GOAL #3   Title Patient will increase left shoulder active abduction to >/= 165 degrees for increased ease reaching.    Time 8    Period Weeks    Status On-going      PT LONG TERM GOAL #4   Title Patient will improve her DASH score to be </= 20 for improved overall upper extremity function.    Time 8    Period Weeks    Status On-going      PT LONG TERM GOAL #5   Title Patient will report >/= 50% decrease in pain for increased tolerance of daily tasks.    Baseline 65% better    Time 8    Period Weeks    Status Achieved      PT LONG TERM GOAL #6   Title Pt will increase sit to stand in 30 seconds to 12 to indicate an improvement in functional strength    Time 8    Period Weeks    Status New                 Plan - 04/10/21 1456    Clinical Impression Statement pts left breast and Left UE assessed for swelling.  No increased circumference measurements in Left UE noted.  Left breast without palpable fibrosis except at lateral breast incision.  Nodularity is slightly smaller but still very firm and resistant.  With review of breast and UE MLD pt did very well but did require some occasional VC's for sequence in the arm and technique for left breast.  Pts. arm was very fatigued on the pulleys so she did take some rest.  After doing several light strengthening exercises pt felt light headed.  She sat down and removed her mask and a cold cloth was placed on her neck.  After drinking some cold water she felt better she felt able to drive home.   She noted that she had a lightheaded spell yesterday as well. I thought perhaps her blood sugar was low but she had eaten prior to therapy.    Personal Factors and Comorbidities Comorbidity 3+    Comorbidities Left breast CA fuunctionally triple neg, chemo, radiation    Stability/Clinical Decision Making Stable/Uncomplicated    Rehab Potential Excellent    PT Frequency 2x / week    PT Duration 8 weeks    PT Treatment/Interventions  ADLs/Self Care Home Management;Therapeutic exercise;Patient/family education;Passive range of motion;Manual lymph drainage;Manual techniques;Scar  mobilization    PT Next Visit Plan See if pt did ok after light headed spell today,Pt to continue wearing her sleeve on a daily basis until 1 month SOZO screen on June 13 secondary to pt 6.9 points above baseline today and was educated in self MLD to UE, Cont. MFR techniquesas needed to left UE, PROM, STM. progress strength    PT Home Exercise Plan Post op shoulder ROM HEP, desensitization techniques, compression bra (now radiation bra) and sleeve, supine scap. series, standing TB, Left UE MLD    Consulted and Agree with Plan of Care Patient           Patient will benefit from skilled therapeutic intervention in order to improve the following deficits and impairments:  Postural dysfunction,Decreased range of motion,Impaired UE functional use,Pain,Decreased knowledge of precautions,Decreased scar mobility,Increased fascial restricitons,Increased edema,Impaired flexibility  Visit Diagnosis: Stiffness of left shoulder, not elsewhere classified  Aftercare following surgery for neoplasm  Abnormal posture  Localized edema  Disorder of the skin and subcutaneous tissue related to radiation, unspecified  Malignant neoplasm of upper-outer quadrant of left breast in female, estrogen receptor positive ([place])     Problem List Patient Active Problem List   Diagnosis Date Noted  . Lymphedema of left arm 02/02/2021  . Drug-induced neutropenia ([place]) 09/30/2020  . Genetic testing 06/26/2020  . Family history of ovarian cancer   . Family history of prostate cancer   . Malignant neoplasm of upper-outer quadrant of left breast in female, estrogen receptor negative ([place]) 06/03/2020    Claris Pong 04/10/2021, 3:07 PM  [place], [place], [address] Phone: [phone]   Fax:   [phone]  Name: ERIN OBANDO MRN: [id number] Date of Birth: 05-05-1986 Cheral Almas, PT 04/10/21 3:08 PM

## 2021-04-14 ENCOUNTER — Encounter: Payer: Self-pay | Admitting: *Deleted

## 2021-04-14 ENCOUNTER — Telehealth: Payer: Self-pay | Admitting: Oncology

## 2021-04-14 NOTE — Telephone Encounter (Signed)
Scheduled per 5/23 los. Called and spoke with pt, confirmed 6/1 lab appt at [facility] and 6/2 appt at [facility]

## 2021-04-15 ENCOUNTER — Other Ambulatory Visit: Payer: 59

## 2021-04-15 ENCOUNTER — Encounter: Payer: Self-pay | Admitting: Oncology

## 2021-04-15 ENCOUNTER — Other Ambulatory Visit: Payer: Self-pay

## 2021-04-15 ENCOUNTER — Inpatient Hospital Stay: Payer: 59 | Attending: Oncology

## 2021-04-15 ENCOUNTER — Telehealth: Payer: Self-pay | Admitting: *Deleted

## 2021-04-15 ENCOUNTER — Other Ambulatory Visit: Payer: Self-pay | Admitting: *Deleted

## 2021-04-15 DIAGNOSIS — Z9221 Personal history of antineoplastic chemotherapy: Secondary | ICD-10-CM | POA: Insufficient documentation

## 2021-04-15 DIAGNOSIS — Z923 Personal history of irradiation: Secondary | ICD-10-CM | POA: Diagnosis not present

## 2021-04-15 DIAGNOSIS — Z801 Family history of malignant neoplasm of trachea, bronchus and lung: Secondary | ICD-10-CM | POA: Diagnosis not present

## 2021-04-15 DIAGNOSIS — C50412 Malignant neoplasm of upper-outer quadrant of left female breast: Secondary | ICD-10-CM | POA: Diagnosis present

## 2021-04-15 DIAGNOSIS — Z171 Estrogen receptor negative status [ER-]: Secondary | ICD-10-CM | POA: Insufficient documentation

## 2021-04-15 DIAGNOSIS — Z8042 Family history of malignant neoplasm of prostate: Secondary | ICD-10-CM | POA: Diagnosis not present

## 2021-04-15 DIAGNOSIS — Z5112 Encounter for antineoplastic immunotherapy: Secondary | ICD-10-CM | POA: Insufficient documentation

## 2021-04-15 DIAGNOSIS — Z8041 Family history of malignant neoplasm of ovary: Secondary | ICD-10-CM | POA: Diagnosis not present

## 2021-04-15 DIAGNOSIS — Z79899 Other long term (current) drug therapy: Secondary | ICD-10-CM | POA: Diagnosis not present

## 2021-04-15 DIAGNOSIS — Z170 Estrogen receptor positive status [ER+]: Secondary | ICD-10-CM

## 2021-04-15 LAB — CBC WITH DIFFERENTIAL/PLATELET
Abs Immature Granulocytes: 0.01 10*3/uL (ref 0.00–0.07)
Basophils Absolute: 0 10*3/uL (ref 0.0–0.1)
Basophils Relative: 1 %
Eosinophils Absolute: 0.3 10*3/uL (ref 0.0–0.5)
Eosinophils Relative: 9 %
HCT: 37.7 % (ref 36.0–46.0)
Hemoglobin: 12.5 g/dL (ref 12.0–15.0)
Immature Granulocytes: 0 %
Lymphocytes Relative: 34 %
Lymphs Abs: 1.1 10*3/uL (ref 0.7–4.0)
MCH: 29.4 pg (ref 26.0–34.0)
MCHC: 33.2 g/dL (ref 30.0–36.0)
MCV: 88.7 fL (ref 80.0–100.0)
Monocytes Absolute: 0.4 10*3/uL (ref 0.1–1.0)
Monocytes Relative: 12 %
Neutro Abs: 1.4 10*3/uL — ABNORMAL LOW (ref 1.7–7.7)
Neutrophils Relative %: 44 %
Platelets: 249 10*3/uL (ref 150–400)
RBC: 4.25 MIL/uL (ref 3.87–5.11)
RDW: 17.1 % — ABNORMAL HIGH (ref 11.5–15.5)
WBC: 3.2 10*3/uL — ABNORMAL LOW (ref 4.0–10.5)
nRBC: 0 % (ref 0.0–0.2)

## 2021-04-15 LAB — COMPREHENSIVE METABOLIC PANEL
ALT: 18 U/L (ref 0–44)
AST: 17 U/L (ref 15–41)
Albumin: 4.2 g/dL (ref 3.5–5.0)
Alkaline Phosphatase: 76 U/L (ref 38–126)
Anion gap: 13 (ref 5–15)
BUN: 12 mg/dL (ref 6–20)
CO2: 25 mmol/L (ref 22–32)
Calcium: 9.8 mg/dL (ref 8.9–10.3)
Chloride: 104 mmol/L (ref 98–111)
Creatinine, Ser: 0.92 mg/dL (ref 0.44–1.00)
GFR, Estimated: >60 mL/min (ref >60)
Glucose, Bld: 112 mg/dL — ABNORMAL HIGH (ref 70–99)
Potassium: 4.5 mmol/L (ref 3.5–5.1)
Sodium: 142 mmol/L (ref 135–145)
Total Bilirubin: 0.8 mg/dL (ref 0.3–1.2)
Total Protein: 7.9 g/dL (ref 6.5–8.1)

## 2021-04-15 NOTE — Telephone Encounter (Signed)
Per MD review - pt is to start [facility] 04/16/2021 and ok to treat with ANC of 1.4.

## 2021-04-16 ENCOUNTER — Encounter: Payer: Self-pay | Admitting: *Deleted

## 2021-04-16 ENCOUNTER — Inpatient Hospital Stay: Payer: 59

## 2021-04-16 VITALS — BP 121/88 | HR 87 | Temp 98.3°F | Resp 18 | Ht 63.0 in | Wt 180.8 lb

## 2021-04-16 DIAGNOSIS — C50412 Malignant neoplasm of upper-outer quadrant of left female breast: Secondary | ICD-10-CM

## 2021-04-16 DIAGNOSIS — Z171 Estrogen receptor negative status [ER-]: Secondary | ICD-10-CM

## 2021-04-16 DIAGNOSIS — Z5112 Encounter for antineoplastic immunotherapy: Secondary | ICD-10-CM | POA: Diagnosis not present

## 2021-04-16 MED ORDER — SODIUM CHLORIDE 0.9 % IV SOLN
200.0000 mg | Freq: Once | INTRAVENOUS | Status: AC
  Administered 2021-04-16: 200 mg via INTRAVENOUS
  Filled 2021-04-16: qty 8

## 2021-04-16 MED ORDER — SODIUM CHLORIDE 0.9% FLUSH
10.0000 mL | INTRAVENOUS | Status: DC | PRN
  Administered 2021-04-16: 10 mL
  Filled 2021-04-16: qty 10

## 2021-04-16 MED ORDER — SODIUM CHLORIDE 0.9 % IV SOLN
Freq: Once | INTRAVENOUS | Status: AC
  Filled 2021-04-16: qty 250

## 2021-04-16 MED ORDER — HEPARIN SOD (PORK) LOCK FLUSH 100 UNIT/ML IV SOLN
500.0000 [IU] | Freq: Once | INTRAVENOUS | Status: AC | PRN
  Administered 2021-04-16: 500 [IU]
  Filled 2021-04-16: qty 5

## 2021-04-16 NOTE — Patient Instructions (Signed)
[facility]    Discharge Instructions:  Thank you for choosing [facility] to provide your oncology and hematology care.   If you have a lab appointment with the Cancer Center, please go directly to the Cancer Center and check in at the registration area.   Wear comfortable clothing and clothing appropriate for easy access to any Portacath or PICC line.   We strive to give you quality time with your provider. You may need to reschedule your appointment if you arrive late (15 or more minutes).  Arriving late affects you and other patients whose appointments are after yours.  Also, if you miss three or more appointments without notifying the office, you may be dismissed from the clinic at the provider's discretion.      For prescription refill requests, have your pharmacy contact our office and allow 72 hours for refills to be completed.    Today you received the following chemotherapy and/or immunotherapy agents Pembrolizumab (KEYTRUDA).   To help prevent nausea and vomiting after your treatment, we encourage you to take your nausea medication as directed.  BELOW ARE SYMPTOMS THAT SHOULD BE REPORTED IMMEDIATELY: . *FEVER GREATER THAN 100.4 F (38 C) OR HIGHER . *CHILLS OR SWEATING . *NAUSEA AND VOMITING THAT IS NOT CONTROLLED WITH YOUR NAUSEA MEDICATION . *UNUSUAL SHORTNESS OF BREATH . *UNUSUAL BRUISING OR BLEEDING . *URINARY PROBLEMS (pain or burning when urinating, or frequent urination) . *BOWEL PROBLEMS (unusual diarrhea, constipation, pain near the anus) . TENDERNESS IN MOUTH AND THROAT WITH OR WITHOUT PRESENCE OF ULCERS (sore throat, sores in mouth, or a toothache) . UNUSUAL RASH, SWELLING OR PAIN  . UNUSUAL VAGINAL DISCHARGE OR ITCHING   Items with * indicate a potential emergency and should be followed up as soon as possible or go to the Emergency Department if any problems should occur.  Please show the CHEMOTHERAPY ALERT CARD or  IMMUNOTHERAPY ALERT CARD at check-in to the Emergency Department and triage nurse.  Should you have questions after your visit or need to cancel or reschedule your appointment, please contact [facility]  Dept: [phone]  and follow the prompts.  Office hours are 8:00 a.m. to 4:30 p.m. Monday - Friday. Please note that voicemails left after 4:00 p.m. may not be returned until the following business day.  We are closed weekends and major holidays. You have access to a nurse at all times for urgent questions. Please call the main number to the clinic Dept: [phone] and follow the prompts.   For any non-urgent questions, you may also contact your provider using MyChart. We now offer e-Visits for anyone 18 and older to request care online for non-urgent symptoms. For details visit [email or web address].   Also download the MyChart app! Go to the app store, search "MyChart", open the app, select [facility], and log in with your MyChart username and password.  Due to Covid, a mask is required upon entering the hospital/clinic. If you do not have a mask, one will be given to you upon arrival. For doctor visits, patients may have 1 support person aged 18 or older with them. For treatment visits, patients cannot have anyone with them due to current Covid guidelines and our immunocompromised population.   Pembrolizumab injection What is this medicine? PEMBROLIZUMAB (pem broe liz ue mab) is a monoclonal antibody. It is used to treat certain types of cancer. This medicine may be used for other purposes; ask your health care provider   or pharmacist if you have questions. COMMON BRAND NAME(S): Keytruda What should I tell my health care provider before I take this medicine? They need to know if you have any of these conditions:  autoimmune diseases like Crohn's disease, ulcerative colitis, or lupus  have had or planning to have an allogeneic stem cell transplant (uses  someone else's stem cells)  history of organ transplant  history of chest radiation  nervous system problems like myasthenia gravis or Guillain-Barre syndrome  an unusual or allergic reaction to pembrolizumab, other medicines, foods, dyes, or preservatives  pregnant or trying to get pregnant  breast-feeding How should I use this medicine? This medicine is for infusion into a vein. It is given by a health care professional in a hospital or clinic setting. A special MedGuide will be given to you before each treatment. Be sure to read this information carefully each time. Talk to your pediatrician regarding the use of this medicine in children. While this drug may be prescribed for children as young as 6 months for selected conditions, precautions do apply. Overdosage: If you think you have taken too much of this medicine contact a poison control center or emergency room at once. NOTE: This medicine is only for you. Do not share this medicine with others. What if I miss a dose? It is important not to miss your dose. Call your doctor or health care professional if you are unable to keep an appointment. What may interact with this medicine? Interactions have not been studied. This list may not describe all possible interactions. Give your health care provider a list of all the medicines, herbs, non-prescription drugs, or dietary supplements you use. Also tell them if you smoke, drink alcohol, or use illegal drugs. Some items may interact with your medicine. What should I watch for while using this medicine? Your condition will be monitored carefully while you are receiving this medicine. You may need blood work done while you are taking this medicine. Do not become pregnant while taking this medicine or for 4 months after stopping it. Women should inform their doctor if they wish to become pregnant or think they might be pregnant. There is a potential for serious side effects to an unborn  child. Talk to your health care professional or pharmacist for more information. Do not breast-feed an infant while taking this medicine or for 4 months after the last dose. What side effects may I notice from receiving this medicine? Side effects that you should report to your doctor or health care professional as soon as possible:  allergic reactions like skin rash, itching or hives, swelling of the face, lips, or tongue  bloody or black, tarry  breathing problems  changes in vision  chest pain  chills  confusion  constipation  cough  diarrhea  dizziness or feeling faint or lightheaded  fast or irregular heartbeat  fever  flushing  joint pain  low blood counts - this medicine may decrease the number of white blood cells, red blood cells and platelets. You may be at increased risk for infections and bleeding.  muscle pain  muscle weakness  pain, tingling, numbness in the hands or feet  persistent headache  redness, blistering, peeling or loosening of the skin, including inside the mouth  signs and symptoms of high blood sugar such as dizziness; dry mouth; dry skin; fruity breath; nausea; stomach pain; increased hunger or thirst; increased urination  signs and symptoms of kidney injury like trouble passing urine or change in   the amount of urine  signs and symptoms of liver injury like dark urine, light-colored stools, loss of appetite, nausea, right upper belly pain, yellowing of the eyes or skin  sweating  swollen lymph nodes  weight loss Side effects that usually do not require medical attention (report to your doctor or health care professional if they continue or are bothersome):  decreased appetite  hair loss  tiredness This list may not describe all possible side effects. Call your doctor for medical advice about side effects. You may report side effects to FDA at 1-800-FDA-1088. Where should I keep my medicine? This drug is given in a hospital  or clinic and will not be stored at home. NOTE: This sheet is a summary. It may not cover all possible information. If you have questions about this medicine, talk to your doctor, pharmacist, or health care provider.  2021 Elsevier/Gold Standard (2019-10-03 21:44:53)  

## 2021-04-17 ENCOUNTER — Telehealth: Payer: Self-pay | Admitting: *Deleted

## 2021-04-17 MED ORDER — OMEPRAZOLE 40 MG PO CPDR: 40.0000 mg | DELAYED_RELEASE_CAPSULE | Freq: Every day | ORAL | 3 refills | Status: DC

## 2021-04-17 NOTE — Telephone Encounter (Signed)
This RN spoke with pt per ongoing cough not relieved with tessalon.  She states she took up to 3 tablets last night with minimal benefit.   Cough is worse at night then day- she notes she has some congestion at night.  Per medication review she is not taking the omeprazole.  This RN discussed use of elevation, adding dextromethorphan as well as need to restart the omeprazole for possible gastric reflux involvement.  This RN also cautioned against more the 2 of the 100 mg tessalon in 12 hours with best to add another form of cough suppressant as discussed above.  Jeanne Evans will institute above and call this RN on Monday.

## 2021-04-20 ENCOUNTER — Ambulatory Visit
Admission: RE | Admit: 2021-04-20 | Discharge: 2021-04-20 | Disposition: A | Discharge status: Home / Self Care | Payer: 59 | Source: Ambulatory Visit | Attending: Radiation Oncology | Admitting: Radiation Oncology

## 2021-04-20 DIAGNOSIS — C50412 Malignant neoplasm of upper-outer quadrant of left female breast: Secondary | ICD-10-CM

## 2021-04-20 DIAGNOSIS — Z171 Estrogen receptor negative status [ER-]: Secondary | ICD-10-CM

## 2021-04-20 NOTE — Progress Notes (Signed)
  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 During treatment she did very well with radiotherapy and did not have significant desquamation. She reports she is doing well overall. She is having chronic cough and has tried OTC decongestants and tessalon pereles. She has not found relief and denies hemoptysis. She does not smoke cigarettes but smokes marijuana every day and has for many years. She's not seen pulmonary in the past but has numerous pulmonary nodules on last year's CT scan.  Impression/Plan: 1. 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 The patient has been doing well since completion of radiotherapy. We discussed that we would be happy to continue to follow her as needed, but she will also continue to follow up with Dr. Jana Hakim in medical oncology. She was counseled on skin care as well as measures to avoid sun exposure to this area.  2. Survivorship. We discussed the importance of survivorship evaluation and encouraged her to attend her upcoming visit with that clinic. 3. Chronic cough. I encouraged her to follow up with Dr. Jana Hakim since he was directing her treatment of these symptoms. She would be willing to see pulmonary if he recommends this.      Carola Rhine, PAC

## 2021-04-21 NOTE — Progress Notes (Signed)
With which provider.Marland KitchenMarland Kitchen

## 2021-04-22 ENCOUNTER — Other Ambulatory Visit: Payer: Self-pay

## 2021-04-22 ENCOUNTER — Ambulatory Visit: Payer: 59 | Attending: Surgery

## 2021-04-22 DIAGNOSIS — R293 Abnormal posture: Secondary | ICD-10-CM

## 2021-04-22 DIAGNOSIS — M25612 Stiffness of left shoulder, not elsewhere classified: Secondary | ICD-10-CM

## 2021-04-22 DIAGNOSIS — L599 Disorder of the skin and subcutaneous tissue related to radiation, unspecified: Secondary | ICD-10-CM

## 2021-04-22 DIAGNOSIS — R600 Localized edema: Secondary | ICD-10-CM

## 2021-04-22 DIAGNOSIS — Z483 Aftercare following surgery for neoplasm: Secondary | ICD-10-CM

## 2021-04-22 DIAGNOSIS — C50412 Malignant neoplasm of upper-outer quadrant of left female breast: Secondary | ICD-10-CM | POA: Diagnosis present

## 2021-04-22 DIAGNOSIS — Z170 Estrogen receptor positive status [ER+]: Secondary | ICD-10-CM

## 2021-04-22 NOTE — Therapy (Signed)
[place], [place], [address] Phone: [phone]   Fax:  [phone]  Physical Therapy Treatment  Patient Details  Name: Jeanne Evans MRN: [id number] Date of Birth: 1985-11-24 Referring Provider (PT): Dr. Erroll Luna   Encounter Date: 04/22/2021   PT End of Session - 04/22/21 0916    Visit Number 32    Number of Visits 38    Date for PT Re-Evaluation 05/03/21    PT Start Time 0910    PT Stop Time 1000    PT Time Calculation (min) 50 min    Activity Tolerance Patient tolerated treatment well    Behavior During Therapy [facility] for tasks assessed/performed           Past Medical History:  Diagnosis Date  . Breast cancer ([place])   . Family history of ovarian cancer   . Family history of prostate cancer   . GERD (gastroesophageal reflux disease)   . History of asthma    has albuterol, uses PRN  . History of heart murmur in childhood   . History of multiple allergies     Past Surgical History:  Procedure Laterality Date  . BREAST LUMPECTOMY WITH RADIOACTIVE SEED AND SENTINEL LYMPH NODE BIOPSY Left 11/19/2020   Procedure: LEFT BREAST LUMPECTOMY X 2 WITH RADIOACTIVE SEED AND SENTINEL LYMPH NODE MAPPING;  Surgeon: Erroll Luna, MD;  Location: [facility];  Service: General;  Laterality: Left;  PECTORAL BLOCK  . PORTACATH PLACEMENT Right 06/18/2020   Procedure: INSERTION PORT-A-CATH WITH ULTRASOUND GUIDANCE;  Surgeon: Erroll Luna, MD;  Location: [facility];  Service: General;  Laterality: Right;  . RE-EXCISION OF BREAST LUMPECTOMY Left 12/09/2020   Procedure: RE-EXCISION OF LEFT BREAST LUMPECTOMY;  Surgeon: Erroll Luna, MD;  Location: [facility];  Service: General;  Laterality: Left;    There were no vitals filed for this visit.   Subjective Assessment - 04/22/21 0908    Subjective I have not been light headed but have still been really tired.   Still battling the cough at night and I am not sleeping well. the other night I slept better but now I cough in the daytime. Dr. Jana Hakim may want to get a pulmonary CAT scan.  I noticed that I lost my sleeve on Monday so [facility]. I still have the very nodular area in the incision.    Pertinent History Patient was diagnosed on 05/12/2020 with left breast cancer. She underwent neoadjuvant chemotherapy 06/19/2020-09/23/2020 and had a left lumpectomy and sentinel node biopsy (6 negative nodes) on 11/19/2020.  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    Patient Stated Goals Decrease pain and improve shoulder ROM    Currently in Pain? No/denies    Pain Score 0-No pain                             [facility] Adult PT Treatment/Exercise - 04/22/21 0001      Shoulder Exercises: Standing   External Rotation Strengthening;15 reps    Theraband Level (Shoulder External Rotation) Level 2 (Red)    Extension Strengthening;Both;15 reps    Theraband Level (Shoulder Extension) Level 2 (Red)    Retraction Strengthening;15 reps    Theraband Level (Shoulder Retraction) Level 2 (Red)    Other Standing Exercises elbow flexion 2#, 2 x 10    Other Standing Exercises jobes flex  and scaption 2 x 5      Shoulder Exercises: Pulleys   Flexion 1 minute    ABduction 1 minute      Manual Therapy   Soft tissue mobilization cocoa butter to pecs,posterior shoulder,and upper traps    Myofascial Release left axillary region and upper arm    Passive ROM left shoulder flex, abd, IR and ER and D2 flex with VC's to relax                       PT Long Term Goals - 03/27/21 1205      PT LONG TERM GOAL #1   Title Patient will demonstrate she has regained full shoulder ROM and function post operatively compared to baselines.    Time 8    Period Weeks    Status On-going      PT LONG TERM GOAL #2   Title Patient will increase left shoulder active flexion to >/= 165  degrees for increased ease reaching.    Baseline 165    Time 8    Period Weeks    Status Achieved      PT LONG TERM GOAL #3   Title Patient will increase left shoulder active abduction to >/= 165 degrees for increased ease reaching.    Time 8    Period Weeks    Status On-going      PT LONG TERM GOAL #4   Title Patient will improve her DASH score to be </= 20 for improved overall upper extremity function.    Time 8    Period Weeks    Status On-going      PT LONG TERM GOAL #5   Title Patient will report >/= 50% decrease in pain for increased tolerance of daily tasks.    Baseline 65% better    Time 8    Period Weeks    Status Achieved      PT LONG TERM GOAL #6   Title Pt will increase sit to stand in 30 seconds to 12 to indicate an improvement in functional strength    Time 8    Period Weeks    Status New                 Plan - 04/22/21 0917    Clinical Impression Statement Pt continues to be very fatigued and is still bothered by coughing at night and now some in the daytime. She may require a lung CT if it does not improve.  We switched order in clinic today and did UE ROM with pulleys and strength exercises first before doing manual therapy.  Her arm still got very fatigued, but she had less pain in the arm and may continue to benefit from doing it that way.  She has not had time to do exercises or MLD over the weekend as she had company and her shoulder was tighter today in all directions with PROM    Personal Factors and Comorbidities Comorbidity 3+    Comorbidities Left breast CA fuunctionally triple neg, chemo, radiation    Stability/Clinical Decision Making Stable/Uncomplicated    Rehab Potential Excellent    PT Frequency 2x / week    PT Duration 8 weeks    PT Treatment/Interventions ADLs/Self Care Home Management;Therapeutic exercise;Patient/family education;Passive range of motion;Manual lymph drainage;Manual techniques;Scar mobilization    PT Next Visit Plan  pt has misplaced sleeve and is going to get a new one,cont. MFR, STM to pecs lats,  strength exs    PT Home Exercise Plan Post op shoulder ROM HEP, desensitization techniques, compression bra (now radiation bra) and sleeve, supine scap. series, standing TB, Left UE MLD    Consulted and Agree with Plan of Care Patient           Patient will benefit from skilled therapeutic intervention in order to improve the following deficits and impairments:  Postural dysfunction,Decreased range of motion,Impaired UE functional use,Pain,Decreased knowledge of precautions,Decreased scar mobility,Increased fascial restricitons,Increased edema,Impaired flexibility  Visit Diagnosis: Stiffness of left shoulder, not elsewhere classified  Aftercare following surgery for neoplasm  Abnormal posture  Localized edema  Disorder of the skin and subcutaneous tissue related to radiation, unspecified  Malignant neoplasm of upper-outer quadrant of left breast in female, estrogen receptor positive ([place])     Problem List Patient Active Problem List   Diagnosis Date Noted  . Lymphedema of left arm 02/02/2021  . Drug-induced neutropenia ([place]) 09/30/2020  . Genetic testing 06/26/2020  . Family history of ovarian cancer   . Family history of prostate cancer   . Malignant neoplasm of upper-outer quadrant of left breast in female, estrogen receptor negative ([place]) 06/03/2020    Claris Pong 04/22/2021, 12:12 PM  [place], [place], [address] Phone: [phone]   Fax:  [phone]  Name: Jeanne Evans MRN: [id number] Date of Birth: 06/19/1986 Cheral Almas, PT 04/22/21 12:13 PM

## 2021-04-27 ENCOUNTER — Other Ambulatory Visit: Payer: Self-pay

## 2021-04-27 ENCOUNTER — Ambulatory Visit: Payer: 59 | Admitting: Physical Therapy

## 2021-04-27 DIAGNOSIS — Z483 Aftercare following surgery for neoplasm: Secondary | ICD-10-CM

## 2021-04-27 NOTE — Therapy (Signed)
[place], [place], [address] Phone: [phone]   Fax:  [phone]  Physical Therapy Treatment  Patient Details  Name: Jeanne Evans MRN: [id number] Date of Birth: 1986/05/12 Referring Provider (PT): Dr. Erroll Luna   Encounter Date: 04/27/2021   PT End of Session - 04/27/21 1552     Visit Number 31   not changed due to screen   PT Start Time 1552    PT Stop Time 1603    PT Time Calculation (min) 11 min             Past Medical History:  Diagnosis Date   Breast cancer ([place])    Family history of ovarian cancer    Family history of prostate cancer    GERD (gastroesophageal reflux disease)    History of asthma    has albuterol, uses PRN   History of heart murmur in childhood    History of multiple allergies     Past Surgical History:  Procedure Laterality Date   BREAST LUMPECTOMY WITH RADIOACTIVE SEED AND SENTINEL LYMPH NODE BIOPSY Left 11/19/2020   Procedure: LEFT BREAST LUMPECTOMY X 2 WITH RADIOACTIVE SEED AND SENTINEL LYMPH NODE MAPPING;  Surgeon: Erroll Luna, MD;  Location: [facility];  Service: General;  Laterality: Left;  PECTORAL BLOCK   PORTACATH PLACEMENT Right 06/18/2020   Procedure: INSERTION PORT-A-CATH WITH ULTRASOUND GUIDANCE;  Surgeon: Erroll Luna, MD;  Location: [facility];  Service: General;  Laterality: Right;   RE-EXCISION OF BREAST LUMPECTOMY Left 12/09/2020   Procedure: RE-EXCISION OF LEFT BREAST LUMPECTOMY;  Surgeon: Erroll Luna, MD;  Location: [facility];  Service: General;  Laterality: Left;    There were no vitals filed for this visit.   Subjective Assessment - 04/27/21 1551     Subjective Pt is here for 3 month SOZO    Pertinent History Patient was diagnosed on 05/12/2020 with left breast cancer. She underwent neoadjuvant chemotherapy 06/19/2020-09/23/2020 and had a left lumpectomy and sentinel node biopsy  (6 negative nodes) on 11/19/2020.  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                    L-DEX FLOWSHEETS - 04/27/21 1600       L-DEX LYMPHEDEMA SCREENING   Measurement Type Unilateral    L-DEX MEASUREMENT EXTREMITY Upper Extremity    POSITION  Standing    DOMINANT SIDE Right    At Risk Side Left    BASELINE SCORE (UNILATERAL) -1.2    L-DEX SCORE (UNILATERAL) 6.7                                    PT Long Term Goals - 03/27/21 1205       PT LONG TERM GOAL #1   Title Patient will demonstrate she has regained full shoulder ROM and function post operatively compared to baselines.    Time 8    Period Weeks    Status On-going      PT LONG TERM GOAL #2   Title Patient will increase left shoulder active flexion to >/= 165 degrees for increased ease reaching.    Baseline 165    Time 8    Period Weeks    Status Achieved      PT LONG TERM GOAL #3   Title Patient will increase left shoulder  active abduction to >/= 165 degrees for increased ease reaching.    Time 8    Period Weeks    Status On-going      PT LONG TERM GOAL #4   Title Patient will improve her DASH score to be </= 20 for improved overall upper extremity function.    Time 8    Period Weeks    Status On-going      PT LONG TERM GOAL #5   Title Patient will report >/= 50% decrease in pain for increased tolerance of daily tasks.    Baseline 65% better    Time 8    Period Weeks    Status Achieved      PT LONG TERM GOAL #6   Title Pt will increase sit to stand in 30 seconds to 12 to indicate an improvement in functional strength    Time 8    Period Weeks    Status New                   Plan - 04/27/21 1623     Clinical Impression Statement Pt comes in today for SOZO screen and has had another increase in L-Dex despite her efforts to wear her compression sleeve.  She cannot currently find it, but is going to get another one ordered right after  this.  Reinforced that pt should be wearing compression sleeve at least 12 hours every day. She is still coming for PT visits and has one tomorrow.  The PT will then address obtaining and wearing her sleeve more and other treatment options.   Pt scheduled for another SOZO check in one month.    PT Next Visit Plan pt has misplaced sleeve and is going to get a new one,cont. MFR, STM to pecs lats             Patient will benefit from skilled therapeutic intervention in order to improve the following deficits and impairments:     Visit Diagnosis: Aftercare following surgery for neoplasm     Problem List Patient Active Problem List   Diagnosis Date Noted   Lymphedema of left arm 02/02/2021   Drug-induced neutropenia ([place]) 09/30/2020   Genetic testing 06/26/2020   Family history of ovarian cancer    Family history of prostate cancer    Malignant neoplasm of upper-outer quadrant of left breast in female, estrogen receptor negative ([place]) 06/03/2020   Donato Heinz. Owens Shark PT  Norwood Levo 04/27/2021, 4:26 PM  [place], [place], [address] Phone: [phone]   Fax:  [phone]  Name: TALEIA SADOWSKI MRN: [id number] Date of Birth: February 12, 1986

## 2021-04-28 ENCOUNTER — Ambulatory Visit: Payer: 59

## 2021-04-28 DIAGNOSIS — C50412 Malignant neoplasm of upper-outer quadrant of left female breast: Secondary | ICD-10-CM

## 2021-04-28 DIAGNOSIS — R293 Abnormal posture: Secondary | ICD-10-CM

## 2021-04-28 DIAGNOSIS — L599 Disorder of the skin and subcutaneous tissue related to radiation, unspecified: Secondary | ICD-10-CM

## 2021-04-28 DIAGNOSIS — Z483 Aftercare following surgery for neoplasm: Secondary | ICD-10-CM

## 2021-04-28 DIAGNOSIS — R600 Localized edema: Secondary | ICD-10-CM

## 2021-04-28 DIAGNOSIS — M25612 Stiffness of left shoulder, not elsewhere classified: Secondary | ICD-10-CM | POA: Diagnosis not present

## 2021-04-28 DIAGNOSIS — Z170 Estrogen receptor positive status [ER+]: Secondary | ICD-10-CM

## 2021-04-28 NOTE — Therapy (Signed)
[place], [place], [address] Phone: [phone]   Fax:  [phone]  Physical Therapy Treatment  Patient Details  Name: Jeanne Evans MRN: [id number] Date of Birth: 04-24-1986 Referring Provider (PT): Dr. Erroll Luna   Encounter Date: 04/28/2021   PT End of Session - 04/28/21 1134     Visit Number 33    Number of Visits 38    Date for PT Re-Evaluation 05/03/21    PT Start Time 9702    PT Stop Time 1056    PT Time Calculation (min) 41 min    Activity Tolerance Patient tolerated treatment well    Behavior During Therapy [facility] for tasks assessed/performed             Past Medical History:  Diagnosis Date   Breast cancer ([place])    Family history of ovarian cancer    Family history of prostate cancer    GERD (gastroesophageal reflux disease)    History of asthma    has albuterol, uses PRN   History of heart murmur in childhood    History of multiple allergies     Past Surgical History:  Procedure Laterality Date   BREAST LUMPECTOMY WITH RADIOACTIVE SEED AND SENTINEL LYMPH NODE BIOPSY Left 11/19/2020   Procedure: LEFT BREAST LUMPECTOMY X 2 WITH RADIOACTIVE SEED AND SENTINEL LYMPH NODE MAPPING;  Surgeon: Erroll Luna, MD;  Location: [facility];  Service: General;  Laterality: Left;  PECTORAL BLOCK   PORTACATH PLACEMENT Right 06/18/2020   Procedure: INSERTION PORT-A-CATH WITH ULTRASOUND GUIDANCE;  Surgeon: Erroll Luna, MD;  Location: [facility];  Service: General;  Laterality: Right;   RE-EXCISION OF BREAST LUMPECTOMY Left 12/09/2020   Procedure: RE-EXCISION OF LEFT BREAST LUMPECTOMY;  Surgeon: Erroll Luna, MD;  Location: [facility];  Service: General;  Laterality: Left;    There were no vitals filed for this visit.   Subjective Assessment - 04/28/21 1028     Subjective There is no pain, but the numbness in my posterior arm is still  there and worse when it is cold. I couldn't find my sleeve but I went to [facility] yesterday and they are closed.  My arm is really hurting especially when the airconditioning comes on.  Pt had SOZO yesterday and measurements still out of range.  pt expresses concern about nodular area still noted in incision and despite having it checked before was advised to follow up with MD sooner if she still has concerns. 14 min late today    Pertinent History Patient was diagnosed on 05/12/2020 with left breast cancer. She underwent neoadjuvant chemotherapy 06/19/2020-09/23/2020 and had a left lumpectomy and sentinel node biopsy (6 negative nodes) on 11/19/2020.  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    Patient Stated Goals Decrease pain and improve shoulder ROM    Currently in Pain? Yes    Pain Score 6     Pain Location Arm    Pain Orientation Left    Pain Descriptors / Indicators Numbness    Pain Type Chronic pain    Pain Onset More than a month ago    Pain Frequency Intermittent    Multiple Pain Sites No                   LYMPHEDEMA/ONCOLOGY QUESTIONNAIRE - 04/28/21 0001       Right Upper Extremity Lymphedema   15 cm Proximal to Olecranon Process  35.7 cm    10 cm Proximal to Olecranon Process 35.2 cm      Left Upper Extremity Lymphedema   15 cm Proximal to Olecranon Process 35.5 cm    10 cm Proximal to Olecranon Process 34.9 cm                        [facility] Adult PT Treatment/Exercise - 04/28/21 0001       Manual Therapy   Edema Management pt measured for one of our sleeves.  Tried on in our clinic and used arm butler to don. Size 3 black sleeve given secondary to SOZO screen yesterday still out of range.    Compression Bandaging Pts upper arms were measured with no increase in swelling noted however, decided to try compression bandaging for a day and night to see if comfortable for pt and would help relieve pain.  Wrapped with small TG soft hand to  axilla, small artiflex hand to axilla, 6 cm hand wrap, and 12 cm wrap wrist to axilla with TG soft pulled down over it.                         PT Long Term Goals - 03/27/21 1205       PT LONG TERM GOAL #1   Title Patient will demonstrate she has regained full shoulder ROM and function post operatively compared to baselines.    Time 8    Period Weeks    Status On-going      PT LONG TERM GOAL #2   Title Patient will increase left shoulder active flexion to >/= 165 degrees for increased ease reaching.    Baseline 165    Time 8    Period Weeks    Status Achieved      PT LONG TERM GOAL #3   Title Patient will increase left shoulder active abduction to >/= 165 degrees for increased ease reaching.    Time 8    Period Weeks    Status On-going      PT LONG TERM GOAL #4   Title Patient will improve her DASH score to be </= 20 for improved overall upper extremity function.    Time 8    Period Weeks    Status On-going      PT LONG TERM GOAL #5   Title Patient will report >/= 50% decrease in pain for increased tolerance of daily tasks.    Baseline 65% better    Time 8    Period Weeks    Status Achieved      PT LONG TERM GOAL #6   Title Pt will increase sit to stand in 30 seconds to 12 to indicate an improvement in functional strength    Time 8    Period Weeks    Status New                   Plan - 04/28/21 1134     Clinical Impression Statement Pt was unable to find her compression sleeve and [facility].  I remeasured for sleeve and we used the arm butler to don it. She fit into Size III black sleeve and was given sleeve today.  there was no measureable difference between arms today but given the patients continued complaints of pain/numbness we decided to wrap her arm as described in flow sheet.  She will wear until tomorrow and then put  her sleeve on in the am.  She was advised to remove the wrap if she has any discomfort/pain with it.   Will determine need for wrap versus sleeve next visit.    Personal Factors and Comorbidities Comorbidity 3+    Comorbidities Left breast CA fuunctionally triple neg, chemo, radiation    Stability/Clinical Decision Making Stable/Uncomplicated    Rehab Potential Excellent    PT Frequency 2x / week    PT Duration 8 weeks    PT Treatment/Interventions ADLs/Self Care Home Management;Therapeutic exercise;Patient/family education;Passive range of motion;Manual lymph drainage;Manual techniques;Scar mobilization    PT Next Visit Plan assess benefit of wrapping vs sleeve given today, Cont MFR, STM prn measure shoulder ROM ,check goals   PT Home Exercise Plan Post op shoulder ROM HEP, desensitization techniques, compression bra (now radiation bra) and sleeve, supine scap. series, standing TB, Left UE MLD    Consulted and Agree with Plan of Care Patient             Patient will benefit from skilled therapeutic intervention in order to improve the following deficits and impairments:  Postural dysfunction, Decreased range of motion, Impaired UE functional use, Pain, Decreased knowledge of precautions, Decreased scar mobility, Increased fascial restricitons, Increased edema, Impaired flexibility  Visit Diagnosis: Aftercare following surgery for neoplasm  Stiffness of left shoulder, not elsewhere classified  Abnormal posture  Localized edema  Disorder of the skin and subcutaneous tissue related to radiation, unspecified  Malignant neoplasm of upper-outer quadrant of left breast in female, estrogen receptor positive ([place])     Problem List Patient Active Problem List   Diagnosis Date Noted   Lymphedema of left arm 02/02/2021   Drug-induced neutropenia ([place]) 09/30/2020   Genetic testing 06/26/2020   Family history of ovarian cancer    Family history of prostate cancer    Malignant neoplasm of upper-outer quadrant of left breast in female, estrogen receptor negative ([place]) 06/03/2020     Claris Pong 04/28/2021, 11:44 AM  [place], [place], [address] Phone: [phone]   Fax:  [phone]  Name: RUBBY BARBARY MRN: [id number] Date of Birth: Jan 29, 1986  Cheral Almas, PT 04/28/21 11:46 AM

## 2021-04-29 ENCOUNTER — Other Ambulatory Visit: Payer: Self-pay | Admitting: *Deleted

## 2021-04-29 ENCOUNTER — Telehealth: Payer: Self-pay | Admitting: *Deleted

## 2021-04-29 DIAGNOSIS — Z171 Estrogen receptor negative status [ER-]: Secondary | ICD-10-CM

## 2021-04-29 DIAGNOSIS — C50412 Malignant neoplasm of upper-outer quadrant of left female breast: Secondary | ICD-10-CM

## 2021-04-29 DIAGNOSIS — R059 Cough, unspecified: Secondary | ICD-10-CM

## 2021-04-29 NOTE — Telephone Encounter (Signed)
This RN spoke with pt who states cough is still present- she states she has better control during the night with previous recommendations- but now cough is more present during the day.  Per discussion- plan is for pt to obtain a Xray of her chest ( 2 view) and then come over to the clinic and ask for this RN for review.  Pt will come in 6/16 after 930 am.

## 2021-04-30 ENCOUNTER — Ambulatory Visit (HOSPITAL_COMMUNITY)
Admission: RE | Admit: 2021-04-30 | Discharge: 2021-04-30 | Disposition: A | Discharge status: Home / Self Care | Payer: 59 | Source: Ambulatory Visit | Attending: Oncology | Admitting: Oncology

## 2021-04-30 ENCOUNTER — Other Ambulatory Visit: Payer: Self-pay

## 2021-04-30 ENCOUNTER — Telehealth: Payer: Self-pay | Admitting: *Deleted

## 2021-04-30 DIAGNOSIS — R059 Cough, unspecified: Secondary | ICD-10-CM | POA: Insufficient documentation

## 2021-04-30 DIAGNOSIS — Z171 Estrogen receptor negative status [ER-]: Secondary | ICD-10-CM | POA: Insufficient documentation

## 2021-04-30 DIAGNOSIS — C50412 Malignant neoplasm of upper-outer quadrant of left female breast: Secondary | ICD-10-CM | POA: Insufficient documentation

## 2021-05-05 ENCOUNTER — Other Ambulatory Visit: Payer: Self-pay | Admitting: Oncology

## 2021-05-05 ENCOUNTER — Other Ambulatory Visit: Payer: Self-pay

## 2021-05-05 ENCOUNTER — Other Ambulatory Visit: Payer: Self-pay | Admitting: Obstetrics & Gynecology

## 2021-05-05 ENCOUNTER — Ambulatory Visit
Admission: RE | Admit: 2021-05-05 | Discharge: 2021-05-05 | Disposition: A | Discharge status: Home / Self Care | Payer: 59 | Source: Ambulatory Visit | Attending: Obstetrics & Gynecology | Admitting: Obstetrics & Gynecology

## 2021-05-05 DIAGNOSIS — N63 Unspecified lump in unspecified breast: Secondary | ICD-10-CM

## 2021-05-05 DIAGNOSIS — C50919 Malignant neoplasm of unspecified site of unspecified female breast: Secondary | ICD-10-CM | POA: Insufficient documentation

## 2021-05-06 ENCOUNTER — Ambulatory Visit: Payer: 59

## 2021-05-06 DIAGNOSIS — R600 Localized edema: Secondary | ICD-10-CM

## 2021-05-06 DIAGNOSIS — L599 Disorder of the skin and subcutaneous tissue related to radiation, unspecified: Secondary | ICD-10-CM

## 2021-05-06 DIAGNOSIS — C50412 Malignant neoplasm of upper-outer quadrant of left female breast: Secondary | ICD-10-CM

## 2021-05-06 DIAGNOSIS — Z483 Aftercare following surgery for neoplasm: Secondary | ICD-10-CM

## 2021-05-06 DIAGNOSIS — Z170 Estrogen receptor positive status [ER+]: Secondary | ICD-10-CM

## 2021-05-06 DIAGNOSIS — M25612 Stiffness of left shoulder, not elsewhere classified: Secondary | ICD-10-CM

## 2021-05-06 DIAGNOSIS — R293 Abnormal posture: Secondary | ICD-10-CM

## 2021-05-06 NOTE — Therapy (Signed)
[place], [place], [address] Phone: [phone]   Fax:  [phone]  Physical Therapy Treatment  Patient Details  Name: Jeanne Evans MRN: [id number] Date of Birth: [birth year] Referring Provider (PT): Dr. Erroll Luna   Encounter Date: 05/06/2021   PT End of Session - 05/06/21 1206     Visit Number 34    Number of Visits 38    Date for PT Re-Evaluation 06/17/21    PT Start Time 1119    PT Stop Time 1210    PT Time Calculation (min) 51 min    Activity Tolerance Patient tolerated treatment well    Behavior During Therapy [facility] for tasks assessed/performed             Past Medical History:  Diagnosis Date   Breast cancer ([place])    Family history of ovarian cancer    Family history of prostate cancer    GERD (gastroesophageal reflux disease)    History of asthma    has albuterol, uses PRN   History of heart murmur in childhood    History of multiple allergies     Past Surgical History:  Procedure Laterality Date   BREAST LUMPECTOMY WITH RADIOACTIVE SEED AND SENTINEL LYMPH NODE BIOPSY Left 11/19/2020   Procedure: LEFT BREAST LUMPECTOMY X 2 WITH RADIOACTIVE SEED AND SENTINEL LYMPH NODE MAPPING;  Surgeon: Erroll Luna, MD;  Location: [place];  Service: General;  Laterality: Left;  PECTORAL BLOCK   PORTACATH PLACEMENT Right 06/18/2020   Procedure: INSERTION PORT-A-CATH WITH ULTRASOUND GUIDANCE;  Surgeon: Erroll Luna, MD;  Location: [place];  Service: General;  Laterality: Right;   RE-EXCISION OF BREAST LUMPECTOMY Left 12/09/2020   Procedure: RE-EXCISION OF LEFT BREAST LUMPECTOMY;  Surgeon: Erroll Luna, MD;  Location: [place];  Service: General;  Laterality: Left;    There were no vitals filed for this visit.   Subjective Assessment - 05/06/21 1120     Subjective I found my old sleeve, now I lost my new sleeve. I like the black  one better and I can tell it is tighter. I had the chest XRay and it all looked good.  They checked the lump in the incision and he knows it is scar tissue. 18 min late    Pertinent History Patient was diagnosed on 05/12/2020 with left breast cancer. She underwent neoadjuvant chemotherapy 06/19/2020-09/23/2020 and had a left lumpectomy and sentinel node biopsy (6 negative nodes) on 11/19/2020.  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    Patient Stated Goals Decrease pain and improve shoulder ROM    Currently in Pain? No/denies    Pain Score 0-No pain                [facility] PT Assessment - 05/06/21 0001       Assessment   Medical Diagnosis s/p left lumpectomy and sentinel node biopsy    Referring Provider (PT) Dr. [name] [place]    Onset Date/Surgical Date 11/19/20      AROM   Left Shoulder Flexion 165 Degrees    Left Shoulder ABduction 165 Degrees    Left Shoulder External Rotation 95 Degrees                   Quick Dash - 05/06/21 0001     Open a tight or new jar Moderate difficulty    Do heavy household chores (wash walls, wash floors) Mild difficulty  Carry a shopping bag or briefcase Mild difficulty    Wash your back No difficulty    Use a knife to cut food No difficulty    Recreational activities in which you take some force or impact through your arm, shoulder, or hand (golf, hammering, tennis) Mild difficulty    During the past week, to what extent has your arm, shoulder or hand problem interfered with your normal social activities with family, friends, neighbors, or groups? Modererately    During the past week, to what extent has your arm, shoulder or hand problem limited your work or other regular daily activities Modererately    Arm, shoulder, or hand pain. Mild    Tingling (pins and needles) in your arm, shoulder, or hand Moderate    Difficulty Sleeping Mild difficulty    DASH Score 29.55 %                    [facility] Adult PT  Treatment/Exercise - 05/06/21 0001       Manual Therapy   Soft tissue mobilization cocoa butter to pecs,posterior shoulder,and upper traps    Manual Lymphatic Drainage (MLD) Mld to supraclavicular, right axillary and left axillary LN,Anterior interaxillary pathway, left axillo-inguinal pathway and left UE starting proximally and working distally on the arm, retracing all steps    Passive ROM left shoulder flex, abd, IR and ER and D2 flex with VC's to relax                         PT Long Term Goals - 05/06/21 1223       PT LONG TERM GOAL #1   Title Patient will demonstrate she has regained full shoulder ROM and function post operatively compared to baselines.    Time 8    Period Weeks    Status Achieved      PT LONG TERM GOAL #2   Title Patient will increase left shoulder active flexion to >/= 165 degrees for increased ease reaching.    Time 8    Period Weeks    Status Achieved      PT LONG TERM GOAL #3   Title Patient will increase left shoulder active abduction to >/= 165 degrees for increased ease reaching.    Period Weeks    Status Achieved      PT LONG TERM GOAL #4   Title Patient will improve her DASH score to be </= 20 for improved overall upper extremity function.    Baseline 70.45 post op, 29% (05/06/21)    Time 8    Period Weeks    Status On-going    Target Date 06/17/21      PT LONG TERM GOAL #5   Title Patient will report >/= 50% decrease in pain for increased tolerance of daily tasks.    Baseline 65% better    Time 8    Period Weeks    Status Achieved      PT LONG TERM GOAL #6   Title Pt will increase sit to stand in 30 seconds to 12 to indicate an improvement in functional strength    Baseline 7 on 03/03/2021, not assessed.  pt having a cough attack    Time 6    Period Weeks  ongoing   Target Date 06/17/21                   Plan - 05/06/21 1218     Clinical Impression  Statement Pt has been compliant with compression sleeve  wear and MLD.  She will have next [facility] screen on July 11.  Her ROM continues to do well althought she was tighter today with PROM.  She continues with complaints of numbness in the posterior arm that improves when she wears her sleeve.  She complains of weakness and notes she is dropping things. She will benefit from PT 1x/week to address strength and MLD prn    Personal Factors and Comorbidities Comorbidity 3+    Comorbidities Left breast CA fuunctionally triple neg, chemo, radiation    Stability/Clinical Decision Making Stable/Uncomplicated    Rehab Potential Excellent    PT Frequency 1x / week    PT Duration 6 weeks   4 visits over 6 weeks   PT Treatment/Interventions ADLs/Self Care Home Management;Therapeutic exercise;Patient/family education;Passive range of motion;Manual lymph drainage;Manual techniques;Scar mobilization    PT Next Visit Plan emphasis on strength, MFR,,, MLD prn, STM prn    PT Home Exercise Plan Post op shoulder ROM HEP, desensitization techniques, compression bra (now radiation bra) and sleeve, supine scap. series, standing TB, Left UE MLD    Recommended Other Services awaiting new bra    Consulted and Agree with Plan of Care Patient             Patient will benefit from skilled therapeutic intervention in order to improve the following deficits and impairments:  Postural dysfunction, Decreased range of motion, Impaired UE functional use, Pain, Decreased knowledge of precautions, Decreased scar mobility, Increased fascial restricitons, Increased edema, Impaired flexibility  Visit Diagnosis: Aftercare following surgery for neoplasm  Stiffness of left shoulder, not elsewhere classified  Abnormal posture  Localized edema  Disorder of the skin and subcutaneous tissue related to radiation, unspecified  Malignant neoplasm of upper-outer quadrant of left breast in female, estrogen receptor positive ([place])     Problem List Patient Active Problem List   Diagnosis  Date Noted   Breast cancer ([place]) 05/05/2021   Lymphedema of left arm 02/02/2021   Drug-induced neutropenia ([place]) 09/30/2020   Genetic testing 06/26/2020   Family history of ovarian cancer    Family history of prostate cancer    Malignant neoplasm of upper-outer quadrant of left breast in female, estrogen receptor negative ([place]) 06/03/2020    Claris Pong 05/06/2021, 12:27 PM  [facility] [address] [place], [place], [address] Phone: [phone]   Fax:  [phone]  Name: Jeanne Evans MRN: [id number] Date of Birth: 14-Aug-1986  Cheral Almas, PT 05/06/21 12:28 PM

## 2021-05-08 ENCOUNTER — Inpatient Hospital Stay: Payer: 59

## 2021-05-08 ENCOUNTER — Other Ambulatory Visit: Payer: Self-pay

## 2021-05-08 ENCOUNTER — Other Ambulatory Visit: Payer: Self-pay | Admitting: Hematology and Oncology

## 2021-05-08 VITALS — BP 122/90 | HR 80 | Temp 98.3°F | Resp 18 | Ht 63.0 in | Wt 183.2 lb

## 2021-05-08 DIAGNOSIS — Z95828 Presence of other vascular implants and grafts: Secondary | ICD-10-CM

## 2021-05-08 DIAGNOSIS — C50412 Malignant neoplasm of upper-outer quadrant of left female breast: Secondary | ICD-10-CM

## 2021-05-08 DIAGNOSIS — Z5112 Encounter for antineoplastic immunotherapy: Secondary | ICD-10-CM | POA: Diagnosis not present

## 2021-05-08 DIAGNOSIS — Z171 Estrogen receptor negative status [ER-]: Secondary | ICD-10-CM

## 2021-05-08 LAB — CBC WITH DIFFERENTIAL/PLATELET
Abs Immature Granulocytes: 0.01 10*3/uL (ref 0.00–0.07)
Basophils Absolute: 0 10*3/uL (ref 0.0–0.1)
Basophils Relative: 1 %
Eosinophils Absolute: 0.6 10*3/uL — ABNORMAL HIGH (ref 0.0–0.5)
Eosinophils Relative: 15 %
HCT: 35.2 % — ABNORMAL LOW (ref 36.0–46.0)
Hemoglobin: 12.1 g/dL (ref 12.0–15.0)
Immature Granulocytes: 0 %
Lymphocytes Relative: 30 %
Lymphs Abs: 1.2 10*3/uL (ref 0.7–4.0)
MCH: 30.3 pg (ref 26.0–34.0)
MCHC: 34.4 g/dL (ref 30.0–36.0)
MCV: 88 fL (ref 80.0–100.0)
Monocytes Absolute: 0.4 10*3/uL (ref 0.1–1.0)
Monocytes Relative: 10 %
Neutro Abs: 1.8 10*3/uL (ref 1.7–7.7)
Neutrophils Relative %: 44 %
Platelets: 240 10*3/uL (ref 150–400)
RBC: 4 MIL/uL (ref 3.87–5.11)
RDW: 15.4 % (ref 11.5–15.5)
WBC: 4 10*3/uL (ref 4.0–10.5)
nRBC: 0 % (ref 0.0–0.2)

## 2021-05-08 LAB — COMPREHENSIVE METABOLIC PANEL
ALT: 19 U/L (ref 0–44)
AST: 19 U/L (ref 15–41)
Albumin: 4 g/dL (ref 3.5–5.0)
Alkaline Phosphatase: 83 U/L (ref 38–126)
Anion gap: 9 (ref 5–15)
BUN: 14 mg/dL (ref 6–20)
CO2: 25 mmol/L (ref 22–32)
Calcium: 9.1 mg/dL (ref 8.9–10.3)
Chloride: 105 mmol/L (ref 98–111)
Creatinine, Ser: 0.8 mg/dL (ref 0.44–1.00)
GFR, Estimated: >60 mL/min (ref >60)
Glucose, Bld: 93 mg/dL (ref 70–99)
Potassium: 4.1 mmol/L (ref 3.5–5.1)
Sodium: 139 mmol/L (ref 135–145)
Total Bilirubin: 0.5 mg/dL (ref 0.3–1.2)
Total Protein: 7.5 g/dL (ref 6.5–8.1)

## 2021-05-08 MED ORDER — SODIUM CHLORIDE 0.9% FLUSH
10.0000 mL | INTRAVENOUS | Status: DC | PRN
Start: 2021-05-08 — End: 2021-05-08
  Administered 2021-05-08: 10 mL
  Filled 2021-05-08: qty 10

## 2021-05-08 MED ORDER — SODIUM CHLORIDE 0.9 % IV SOLN
Freq: Once | INTRAVENOUS | Status: AC
Start: 2021-05-08 — End: 2021-05-08
  Filled 2021-05-08: qty 250

## 2021-05-08 MED ORDER — HEPARIN SOD (PORK) LOCK FLUSH 100 UNIT/ML IV SOLN
500.0000 [IU] | Freq: Once | INTRAVENOUS | Status: AC | PRN
  Administered 2021-05-08: 500 [IU]
  Filled 2021-05-08: qty 5

## 2021-05-08 MED ORDER — SODIUM CHLORIDE 0.9 % IV SOLN
200.0000 mg | Freq: Once | INTRAVENOUS | Status: AC
  Administered 2021-05-08: 200 mg via INTRAVENOUS
  Filled 2021-05-08: qty 8

## 2021-05-08 MED ORDER — ALTEPLASE 2 MG IJ SOLR
INTRAMUSCULAR | Status: AC
  Filled 2021-05-08: qty 2

## 2021-05-08 MED ORDER — ALTEPLASE 2 MG IJ SOLR
2.0000 mg | Freq: Once | INTRAMUSCULAR | Status: AC
  Administered 2021-05-08: 2 mg
  Filled 2021-05-08: qty 2

## 2021-05-08 NOTE — Patient Instructions (Signed)
Implanted Port Home Guide An implanted port is a device that is placed under the skin. It is usually placed in the chest. The device can be used to give IV medicine, to take blood, or for dialysis. You may have an implanted port if: You need IV medicine that would be irritating to the small veins in your hands or arms. You need IV medicines, such as antibiotics, for a long period of time. You need IV nutrition for a long period of time. You need dialysis. When you have a port, your health care provider can choose to use the port instead of veins in your arms for these procedures. You may have fewer limitations when using a port than you would if you used other types of long-term IVs, and you will likely be able to return to normal activities afteryour incision heals. An implanted port has two main parts: Reservoir. The reservoir is the part where a needle is inserted to give medicines or draw blood. The reservoir is round. After it is placed, it appears as a small, raised area under your skin. Catheter. The catheter is a thin, flexible tube that connects the reservoir to a vein. Medicine that is inserted into the reservoir goes into the catheter and then into the vein. How is my port accessed? To access your port: A numbing cream may be placed on the skin over the port site. Your health care provider will put on a mask and sterile gloves. The skin over your port will be cleaned carefully with a germ-killing soap and allowed to dry. Your health care provider will gently pinch the port and insert a needle into it. Your health care provider will check for a blood return to make sure the port is in the vein and is not clogged. If your port needs to remain accessed to get medicine continuously (constant infusion), your health care provider will place a clear bandage (dressing) over the needle site. The dressing and needle will need to be changed every week, or as told by your health care provider. What  is flushing? Flushing helps keep the port from getting clogged. Follow instructions from your health care provider about how and when to flush the port. Ports are usually flushed with saline solution or a medicine called heparin. The need for flushing will depend on how the port is used: If the port is only used from time to time to give medicines or draw blood, the port may need to be flushed: Before and after medicines have been given. Before and after blood has been drawn. As part of routine maintenance. Flushing may be recommended every 4-6 weeks. If a constant infusion is running, the port may not need to be flushed. Throw away any syringes in a disposal container that is meant for sharp items (sharps container). You can buy a sharps container from a pharmacy, or you can make one by using an empty hard plastic bottle with a cover. How long will my port stay implanted? The port can stay in for as long as your health care provider thinks it is needed. When it is time for the port to come out, a surgery will be done to remove it. The surgery will be similar to the procedure that was done to putthe port in. Follow these instructions at home:  Flush your port as told by your health care provider. If you need an infusion over several days, follow instructions from your health care provider about how to take   care of your port site. Make sure you: Wash your hands with soap and water before you change your dressing. If soap and water are not available, use alcohol-based hand sanitizer. Change your dressing as told by your health care provider. Place any used dressings or infusion bags into a plastic bag. Throw that bag in the trash. Keep the dressing that covers the needle clean and dry. Do not get it wet. Do not use scissors or sharp objects near the tube. Keep the tube clamped, unless it is being used. Check your port site every day for signs of infection. Check for: Redness, swelling, or  pain. Fluid or blood. Pus or a bad smell. Protect the skin around the port site. Avoid wearing bra straps that rub or irritate the site. Protect the skin around your port from seat belts. Place a soft pad over your chest if needed. Bathe or shower as told by your health care provider. The site may get wet as long as you are not actively receiving an infusion. Return to your normal activities as told by your health care provider. Ask your health care provider what activities are safe for you. Carry a medical alert card or wear a medical alert bracelet at all times. This will let health care providers know that you have an implanted port in case of an emergency. Get help right away if: You have redness, swelling, or pain at the port site. You have fluid or blood coming from your port site. You have pus or a bad smell coming from the port site. You have a fever. Summary Implanted ports are usually placed in the chest for long-term IV access. Follow instructions from your health care provider about flushing the port and changing bandages (dressings). Take care of the area around your port by avoiding clothing that puts pressure on the area, and by watching for signs of infection. Protect the skin around your port from seat belts. Place a soft pad over your chest if needed. Get help right away if you have a fever or you have redness, swelling, pain, drainage, or a bad smell at the port site. This information is not intended to replace advice given to you by your health care provider. Make sure you discuss any questions you have with your healthcare provider. Document Revised: 03/17/2020 Document Reviewed: 03/17/2020 Elsevier Patient Education  2022 [employer] Inc.  

## 2021-05-08 NOTE — Patient Instructions (Signed)
[facility]  ° Discharge Instructions: °Thank you for choosing [facility] to provide your oncology and hematology care.  ° °If you have a lab appointment with the Cancer Center, please go directly to the Cancer Center and check in at the registration area. °  °Wear comfortable clothing and clothing appropriate for easy access to any Portacath or PICC line.  ° °We strive to give you quality time with your provider. You may need to reschedule your appointment if you arrive late (15 or more minutes).  Arriving late affects you and other patients whose appointments are after yours.  Also, if you miss three or more appointments without notifying the office, you may be dismissed from the clinic at the provider’s discretion.    °  °For prescription refill requests, have your pharmacy contact our office and allow 72 hours for refills to be completed.   ° °Today you received the following chemotherapy and/or immunotherapy agents: Pembrolizumab (Keytruda) °  °To help prevent nausea and vomiting after your treatment, we encourage you to take your nausea medication as directed. ° °BELOW ARE SYMPTOMS THAT SHOULD BE REPORTED IMMEDIATELY: °*FEVER GREATER THAN 100.4 F (38 °C) OR HIGHER °*CHILLS OR SWEATING °*NAUSEA AND VOMITING THAT IS NOT CONTROLLED WITH YOUR NAUSEA MEDICATION °*UNUSUAL SHORTNESS OF BREATH °*UNUSUAL BRUISING OR BLEEDING °*URINARY PROBLEMS (pain or burning when urinating, or frequent urination) °*BOWEL PROBLEMS (unusual diarrhea, constipation, pain near the anus) °TENDERNESS IN MOUTH AND THROAT WITH OR WITHOUT PRESENCE OF ULCERS (sore throat, sores in mouth, or a toothache) °UNUSUAL RASH, SWELLING OR PAIN  °UNUSUAL VAGINAL DISCHARGE OR ITCHING  ° °Items with * indicate a potential emergency and should be followed up as soon as possible or go to the Emergency Department if any problems should occur. ° °Please show the CHEMOTHERAPY ALERT CARD or IMMUNOTHERAPY ALERT CARD  at check-in to the Emergency Department and triage nurse. ° °Should you have questions after your visit or need to cancel or reschedule your appointment, please contact [facility]  Dept: [phone]  and follow the prompts.  Office hours are 8:00 a.m. to 4:30 p.m. Monday - Friday. Please note that voicemails left after 4:00 p.m. may not be returned until the following business day.  We are closed weekends and major holidays. You have access to a nurse at all times for urgent questions. Please call the main number to the clinic Dept: [phone] and follow the prompts. ° ° °For any non-urgent questions, you may also contact your provider using MyChart. We now offer e-Visits for anyone 18 and older to request care online for non-urgent symptoms. For details visit [email or web address]. °  °Also download the MyChart app! Go to the app store, search "MyChart", open the app, select [facility], and log in with your MyChart username and password. ° °Due to Covid, a mask is required upon entering the hospital/clinic. If you do not have a mask, one will be given to you upon arrival. For doctor visits, patients may have 1 support person aged 18 or older with them. For treatment visits, patients cannot have anyone with them due to current Covid guidelines and our immunocompromised population.  ° °

## 2021-05-11 ENCOUNTER — Inpatient Hospital Stay (HOSPITAL_BASED_OUTPATIENT_CLINIC_OR_DEPARTMENT_OTHER): Payer: 59 | Admitting: Adult Health

## 2021-05-11 ENCOUNTER — Other Ambulatory Visit: Payer: Self-pay | Admitting: *Deleted

## 2021-05-11 ENCOUNTER — Other Ambulatory Visit: Payer: Self-pay

## 2021-05-11 VITALS — BP 131/86 | HR 88 | Temp 97.1°F | Resp 18 | Ht 63.0 in | Wt 181.1 lb

## 2021-05-11 DIAGNOSIS — R053 Chronic cough: Secondary | ICD-10-CM | POA: Diagnosis not present

## 2021-05-11 DIAGNOSIS — C50412 Malignant neoplasm of upper-outer quadrant of left female breast: Secondary | ICD-10-CM | POA: Diagnosis not present

## 2021-05-11 DIAGNOSIS — Z5112 Encounter for antineoplastic immunotherapy: Secondary | ICD-10-CM | POA: Diagnosis not present

## 2021-05-11 DIAGNOSIS — Z171 Estrogen receptor negative status [ER-]: Secondary | ICD-10-CM

## 2021-05-11 DIAGNOSIS — R059 Cough, unspecified: Secondary | ICD-10-CM

## 2021-05-11 DIAGNOSIS — C50919 Malignant neoplasm of unspecified site of unspecified female breast: Secondary | ICD-10-CM

## 2021-05-11 MED ORDER — PREDNISONE 20 MG PO TABS: 20.0000 mg | ORAL_TABLET | Freq: Every day | ORAL | 0 refills | Status: DC

## 2021-05-11 NOTE — Progress Notes (Addendum)
[facility]  [phone] Fax:([phone]     ID: CRISELDA STARKE DOB: 05-31-1986  MR#: [id number]  TDS#:[id number]  Patient Care Team: [name], MD as PCP - General (Obstetrics and Gynecology) Rockwell Germany, RN as Oncology Nurse Navigator Mauro Kaufmann, RN as Oncology Nurse Navigator Erroll Luna, MD as Consulting Physician (General Surgery) Magrinat, Virgie Dad, MD as Consulting Physician (Oncology) Kyung Rudd, MD as Consulting Physician (Radiation Oncology) Servando Salina, MD as Consulting Physician (Obstetrics and Gynecology) Scot Dock, NP OTHER MD:  CHIEF COMPLAINT: 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 She completed chemotherapy with Doxorubicin and cyclophsopahmide followed by weekly Paclitaxel and Carboplatin x 4 weekly cycles stopped early due to early peripheral neuropathy.  She has noted some ? Motor changes in her hands recently.  After finishing her chemo she then underwent surgery, and completed radiation therapy in April. She received two doses of Pembrolizumab on 04/16/2021 and 05/09/2021.    Adeline is here today for urgent evaluation of a cough.  She says that this cough began during radiation therapy.  This cough has not improved.  It has become constant.  It has been worsening.  It is rarely productive, and if it is productive, she has clear sputum.  She notes that Friday night after her second Pembrolizumab dose she had a cough that was progressively worsening, and she had associated fatigue, vomiting, and decreased appetite.  This has impacted her ability to sleep.  She is using an albuterol inhaler, a humidifier, taking otc delsym and an antacid.  She feels short of breath when she is coughing.  She elevates her head, and is still struggling with coughing and breathing  at night.  She denies any post nasal drainage.      REVIEW OF SYSTEMS: Review of Systems  Constitutional:  Negative for appetite change, chills, fatigue and fever.  HENT:   Negative for hearing loss.   Eyes:  Negative for eye problems and icterus.  Respiratory:  Positive for cough and shortness of breath. Negative for chest tightness, hemoptysis and wheezing.   Cardiovascular:  Negative for chest pain, leg swelling and palpitations.  Gastrointestinal:  Negative for abdominal distention, abdominal pain, blood in stool, constipation, diarrhea, nausea and vomiting.  Endocrine: Negative for hot flashes.  Genitourinary:  Negative for difficulty urinating.   Musculoskeletal:  Negative for arthralgias.  Skin:  Negative for itching and rash.  Neurological:  Negative for dizziness, extremity weakness, headaches and numbness.  Hematological:  Negative for adenopathy. Does not bruise/bleed easily.  Psychiatric/Behavioral:  Negative for depression. The patient is not nervous/anxious.    COVID 19 VACCINATION STATUS: [place] x2, most recently 01/2021  HISTORY OF CURRENT ILLNESS: From the original intake note:  Deni D [place] herself palpated a painful left breast lump. She underwent bilateral diagnostic mammography with tomography and left breast ultrasonography at [facility] on 05/12/2020 showing: breast density category B; palpable 2.7 cm oval hypoechoic mass in left breast at 2-3 o'clock; 2.2 cm left axillary lymph node/grouping of nodes.  Accordingly on 05/29/2020 she proceeded to biopsy of the left breast area in question. The pathology from this procedure ([id number]) showed: invasive ductal carcinoma, grade 3. Prognostic indicators significant for: estrogen receptor, 10% positive with weak staining intensity and progesterone receptor, 0% negative. Proliferation marker Ki67 at 95%. HER2 equivocal by immunohistochemistry (2+), but negative by fluorescent in  situ hybridization with a  signals ratio 1.77 and number per cell 2.65.  Biopsy of the left axillary lymph node in question was negative and concordant  The patient's subsequent history is as detailed below.   PAST MEDICAL HISTORY: Past Medical History:  Diagnosis Date   Breast cancer ([place])    Family history of ovarian cancer    Family history of prostate cancer    GERD (gastroesophageal reflux disease)    History of asthma    has albuterol, uses PRN   History of heart murmur in childhood    History of multiple allergies   History of allergy related headaches   PAST SURGICAL HISTORY: Past Surgical History:  Procedure Laterality Date   BREAST LUMPECTOMY WITH RADIOACTIVE SEED AND SENTINEL LYMPH NODE BIOPSY Left 11/19/2020   Procedure: LEFT BREAST LUMPECTOMY X 2 WITH RADIOACTIVE SEED AND SENTINEL LYMPH NODE MAPPING;  Surgeon: Erroll Luna, MD;  Location: [facility];  Service: General;  Laterality: Left;  PECTORAL BLOCK   PORTACATH PLACEMENT Right 06/18/2020   Procedure: INSERTION PORT-A-CATH WITH ULTRASOUND GUIDANCE;  Surgeon: Erroll Luna, MD;  Location: [facility];  Service: General;  Laterality: Right;   RE-EXCISION OF BREAST LUMPECTOMY Left 12/09/2020   Procedure: RE-EXCISION OF LEFT BREAST LUMPECTOMY;  Surgeon: Erroll Luna, MD;  Location: [place];  Service: General;  Laterality: Left;  She reports having a boil lanced from her perineal area   FAMILY HISTORY: Family History  Problem Relation Age of Onset   Lung cancer Maternal Uncle        dx late 75s   Prostate cancer Paternal Uncle        dx late 64s   Ovarian cancer Paternal Grandmother        dx 90s   Prostate cancer Paternal Grandfather        dx 77s   Prostate cancer Paternal Uncle        dx early 63s  The patient's father is 20 and her mother 56, as of 05/2020.  The patient has two brothers and one sister. She reports ovarian cancer in her paternal grandmother, prostate cancer in her  paternal grandfather and a paternal uncle, and lung cancer in a maternal uncle.   GYNECOLOGIC HISTORY:  No LMP recorded. (Menstrual status: IUD). Menarche: 35 years old Age at first live birth: 35 years old [place] P 2 LMP current, experiences spotting Contraceptive: Mirena IUD in place HRT n/a  Hysterectomy? no BSO? 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 She describes herself as single. She lives at home with her children, [name] (age 54) and [name] (age 28).  She is originally from [place] her home.    ADVANCED DIRECTIVES: Not in place. She intends to name her mother, Zarin Hagmann, as her HCPOA.  [name] lives in [place] and can be reached at [phone].  The patient was given the appropriate documents to complete and notarized at her discretion at the time of her visit 06/04/2020   HEALTH MAINTENANCE: Social History   Tobacco Use   Smoking status: Never   Smokeless tobacco: Never  Substance Use Topics   Alcohol use: Yes    Comment: occas   Drug use: Yes    Types: Marijuana     Colonoscopy: n/a (age)  PAP: 04/2020  Bone density: n/a (age)   Allergies  Allergen Reactions   Sulfa  Antibiotics Hives    Current Outpatient Medications  Medication Sig Dispense Refill   Acetaminophen (TYLENOL) 325 MG CAPS Take by mouth.     Dextromethorphan-guaiFENesin (DELSYM COUGH/CHEST CONGEST DM PO) Take 2 tablets by mouth every 8 (eight) hours as needed.     ibuprofen (ADVIL) 800 MG tablet Take 1 tablet (800 mg total) by mouth every 8 (eight) hours as needed. 30 tablet 0   lidocaine-prilocaine (EMLA) cream Apply to affected area once 30 g 3   metroNIDAZOLE (FLAGYL) 500 MG tablet Take 500 mg by mouth 2 (two) times daily.     omeprazole (PRILOSEC) 40 MG capsule Take 1 capsule (40 mg total) by mouth at bedtime. 30 capsule 3   valACYclovir (VALTREX)  500 MG tablet Take 500 mg by mouth 2 (two) times daily.     venlafaxine XR (EFFEXOR-XR) 75 MG 24 hr capsule Take 1 capsule (75 mg total) by mouth daily with breakfast. 90 capsule 4   benzonatate (TESSALON) 100 MG capsule Take 1 capsule (100 mg total) by mouth 2 (two) times daily as needed for cough. (Patient not taking: Reported on 05/11/2021) 60 capsule 1   gabapentin (NEURONTIN) 300 MG capsule Take 1 capsule (300 mg total) by mouth at bedtime. (Patient not taking: Reported on 05/11/2021) 90 capsule 4   No current facility-administered medications for this visit.    OBJECTIVE: African-American woman who appears stated age 46:   05/11/21 1449  BP: 131/86  Pulse: 88  Resp: 18  Temp: (!) 97.1 F (36.2 C)  SpO2: 97%     Body mass index is 32.08 kg/m.   Wt Readings from Last 3 Encounters:  05/11/21 181 lb 1.6 oz (82.1 kg)  05/08/21 183 lb 4 oz (83.1 kg)  04/16/21 180 lb 12.4 oz (82 kg)  ECOG FS:1 - Symptomatic but completely ambulatory GENERAL: Patient is a well appearing female in no acute distress HEENT:  Sclerae anicteric.  Mask in place. Neck is supple.  NODES:  No cervical, supraclavicular, or axillary lymphadenopathy palpated.  BREAST EXAM:  Deferred. LUNGS:  occasional wheezes bilaterally, patient with difficulty in deep breathing due to cough, otherwise clear throughout. HEART:  Regular rate and rhythm. No murmur appreciated. ABDOMEN:  Soft, nontender.  Positive, normoactive bowel sounds. No organomegaly palpated. MSK:  No focal spinal tenderness to palpation. Full range of motion bilaterally in the upper extremities. EXTREMITIES:  No peripheral edema.   SKIN:  Clear with no obvious rashes or skin changes. No nail dyscrasia. NEURO:  Nonfocal. Well oriented.  Appropriate affect.    LAB RESULTS:  CMP     Component Value Date/Time   NA 139 05/08/2021 1100   K 4.1 05/08/2021 1100   CL 105 05/08/2021 1100   CO2 25 05/08/2021 1100   GLUCOSE 93 05/08/2021 1100   BUN 14  05/08/2021 1100   CREATININE 0.80 05/08/2021 1100   CREATININE 0.92 06/04/2020 0830   CALCIUM 9.1 05/08/2021 1100   PROT 7.5 05/08/2021 1100   ALBUMIN 4.0 05/08/2021 1100   AST 19 05/08/2021 1100   AST 13 (L) 06/04/2020 0830   ALT 19 05/08/2021 1100   ALT 14 06/04/2020 0830   ALKPHOS 83 05/08/2021 1100   BILITOT 0.5 05/08/2021 1100   BILITOT 0.5 06/04/2020 0830   GFRNONAA >60 05/08/2021 1100   GFRNONAA >60 06/04/2020 0830   GFRAA >60 08/05/2020 0955   GFRAA >60 06/04/2020 0830    No results found for: TOTALPROTELP, ALBUMINELP, A1GS, A2GS, BETS, BETA2SER, GAMS, MSPIKE, SPEI  Lab Results  Component Value Date   WBC 4.0 05/08/2021   NEUTROABS 1.8 05/08/2021   HGB 12.1 05/08/2021   HCT 35.2 (L) 05/08/2021   MCV 88.0 05/08/2021   PLT 240 05/08/2021    No results found for: LABCA2  No components found for: ASTMHD622  No results for input(s): INR in the last 168 hours.  No results found for: LABCA2  No results found for: WLN989  No results found for: QJJ941  No results found for: DEY814  No results found for: CA2729  No components found for: HGQUANT  No results found for: CEA1 / No results found for: CEA1   No results found for: AFPTUMOR  No results found for: CHROMOGRNA  No results found for: KPAFRELGTCHN, LAMBDASER, KAPLAMBRATIO (kappa/lambda light chains)  No results found for: HGBA, HGBA2QUANT, HGBFQUANT, HGBSQUAN (Hemoglobinopathy evaluation)   No results found for: LDH  No results found for: IRON, TIBC, IRONPCTSAT (Iron and TIBC)  No results found for: FERRITIN  Urinalysis No results found for: COLORURINE, APPEARANCEUR, LABSPEC, PHURINE, GLUCOSEU, HGBUR, BILIRUBINUR, KETONESUR, PROTEINUR, UROBILINOGEN, NITRITE, LEUKOCYTESUR   STUDIES: DG Chest 2 View  Result Date: 04/30/2021 CLINICAL DATA:  Cough over the last few months. History of breast cancer. EXAM: CHEST - 2 VIEW COMPARISON:  06/18/2020 FINDINGS: Heart size is normal. Mediastinal shadows  are normal. Power port inserted from a right internal jugular approach has its tip in the SVC at the azygos level. The lungs are clear. No infiltrate, effusion, collapse, mass or nodule. Surgical clips in the region of the left breast and axilla. IMPRESSION: No active disease. Electronically Signed   By: Nelson Chimes M.D.   On: 04/30/2021 10:55   US BREAST LTD UNI LEFT INC AXILLA  Result Date: 05/05/2021 CLINICAL DATA:  Surgery for breast cancer January 2022. Palpable lump at the postsurgical site. EXAM: DIGITAL DIAGNOSTIC BILATERAL MAMMOGRAM WITH TOMOSYNTHESIS AND CAD; ULTRASOUND LEFT BREAST LIMITED TECHNIQUE: Bilateral digital diagnostic mammography and breast tomosynthesis was performed. The images were evaluated with computer-aided detection.; Targeted ultrasound examination of the left breast was performed COMPARISON:  Previous exam(s). ACR Breast Density Category b: There are scattered areas of fibroglandular density. FINDINGS: The BB marking the palpable site is located at the site of the patient's surgery. The lumpectomy site appears as expected. No suspicious masses seen in either breast. Skin thickening on the left is consistent with radiation. On physical exam, there is a lump at the scar. Targeted ultrasound is performed, showing scarring at the left lumpectomy site. No focal mass. IMPRESSION: The mammographic and sonographic findings are consistent with scarring. No evidence of malignancy in either breast. RECOMMENDATION: Treatment of the patient's symptoms should be based on clinical and physical exam given lack of imaging findings. Recommend annual diagnostic mammography. I have discussed the findings and recommendations with the patient. If applicable, a reminder letter will be sent to the patient regarding the next appointment. BI-RADS CATEGORY  2: Benign. Electronically Signed   By: Dorise Bullion III M.D   On: 05/05/2021 16:40  MM DIAG BREAST TOMO BILATERAL  Result Date:  05/05/2021 CLINICAL DATA:  Surgery for breast cancer January 2022. Palpable lump at the postsurgical site. EXAM: DIGITAL DIAGNOSTIC BILATERAL MAMMOGRAM WITH TOMOSYNTHESIS AND CAD; ULTRASOUND LEFT BREAST LIMITED TECHNIQUE: Bilateral digital diagnostic mammography and breast tomosynthesis was performed. The images were evaluated with computer-aided detection.; Targeted ultrasound examination of the left breast was performed COMPARISON:  Previous exam(s). ACR Breast Density Category b: There are scattered areas of fibroglandular density. FINDINGS:  The BB marking the palpable site is located at the site of the patient's surgery. The lumpectomy site appears as expected. No suspicious masses seen in either breast. Skin thickening on the left is consistent with radiation. On physical exam, there is a lump at the scar. Targeted ultrasound is performed, showing scarring at the left lumpectomy site. No focal mass. IMPRESSION: The mammographic and sonographic findings are consistent with scarring. No evidence of malignancy in either breast. RECOMMENDATION: Treatment of the patient's symptoms should be based on clinical and physical exam given lack of imaging findings. Recommend annual diagnostic mammography. I have discussed the findings and recommendations with the patient. If applicable, a reminder letter will be sent to the patient regarding the next appointment. BI-RADS CATEGORY  2: Benign. Electronically Signed   By: Dorise Bullion III M.D   On: 05/05/2021 16:40    ELIGIBLE FOR AVAILABLE RESEARCH PROTOCOL: no  ASSESSMENT: 35 y.o. [place] woman status post left breast upper outer quadrant biopsy 05/29/2020 for a clinical T2 N0, stage IIB invasive ductal carcinoma, functionally triple negative, with an MIB-1 of 95%.  (1) genetics testing 06/23/2020 through the [facility] Multi-Cancer Panel found no deleterious mutations in AIP, ALK, APC, ATM, AXIN2,BAP1,  BARD1, BLM, BMPR1A, BRCA1, BRCA2, BRIP1, CASR, CDC73, CDH1,  CDK4, CDKN1B, CDKN1C, CDKN2A (p14ARF), CDKN2A (p16INK4a), CEBPA, CHEK2, CTNNA1, DICER1, DIS3L2, EGFR (c.2369C>T, p.Thr790Met variant only), EPCAM (Deletion/duplication testing only), FH, FLCN, GATA2, GPC3, GREM1 (Promoter region deletion/duplication testing only), HOXB13 (c.251G>A, p.Gly84Glu), HRAS, KIT, MAX, MEN1, MET, MITF (c.952G>A, p.Glu318Lys variant only), MLH1, MSH2, MSH3, MSH6, MUTYH, NBN, NF1, NF2, NTHL1, PALB2, PDGFRA, PHOX2B, PMS2, POLD1, POLE, POT1, PRKAR1A, PTCH1, PTEN, RAD50, RAD51C, RAD51D, RB1, RECQL4, RET, RNF43, RUNX1, SDHAF2, SDHA (sequence changes only), SDHB, SDHC, SDHD, SMAD4, SMARCA4, SMARCB1, SMARCE1, STK11, SUFU, TERC, TERT, TMEM127, TP53, TSC1, TSC2, VHL, WRN and WT1.   (2) neoadjuvant chemotherapy consisting of cyclophosphamide and doxorubicin in dose dense fashion x4 started 06/19/2020, completed 08/05/2020, followed by paclitaxel and carboplatin weekly x12 starting 08/26/2020, completing 4 doses 09/23/2020  (a) echocardiogram on 06/13/2020 showed an EF of 60-65%  (b) paclitaxel and carboplatin discontinued after 4 doses because of neuropathy and cytopenias  (3) status post left lumpectomy and sentinel lymph node sampling 11/19/2020 for a residual yp T1c ypN0 invasive ductal carcinoma involving the superior margin  (a) additional surgery 12/09/2020 cleared the margin in question  (b) repeat prognostic panel confirms triple negative disease  (4) postmastectomy radiation started 01/22/2021:   (a) receiving capecitabine sensitization, started 01/26/2021  (5) pembrolizumab/Keytruda began on 04/16/2021   PLAN: Orlene is here today for follow up of her persistent cough which worsened greatly over the weekend.  She is already taking Omeprazole for reflux and has been consistent with her taking this.  I reviewed this case with Dr. Jana Hakim, as Pembrolizumab can sometimes lead to pneumonitis.  He cam and saw Sheliah as well.  We will plan the following: hold Pembrolizumab for a  couple of cycles, Prednisone 2m daily x 5 days, and pulmonology referral.    We reviewed the above with Curtis in detail and she is in agreement with this plan.  We will have a phone visit with her on Friday to ensure she is getting better, and she will return in 6 weeks to see Dr. MJana Hakim    Total encounter time 30 minutes.* in chart review, lab review, treatment review, face-to-face visit time, order entry, and documentation of the encounter.     LWilber Bihari NP 05/11/21 2:57 PM Medical Oncology  and Hematology [facility] [place], [place] [address] Tel. [phone]    Fax. [phone]   ADDENDUM: Surveillance cough persists.  Even though the chest x-ray is negative I am concerned regarding pneumonitis from the pembrolizumab.  She has taking Prilosec now for some time without improvement so I do not think reflux is going to be the cause.  She does have some wheezing at times and possibly mild asthma could be the actual diagnosis.  In any case we have to hold the pembrolizumab at least for now.  She is going to have a brief course of steroids which of course would treat the asthma as well as the pneumonitis if there is pneumonitis.  This may be repeated if necessary.  We are referring her to a pulmonary specialist for further evaluation.  At this point it seems likely we will not resume pembrolizumab but we will make that decision more definitively after the next visit.  Total encounter time 30 minutes.   I personally saw this patient and performed a substantive portion of this encounter with the listed APP documented above.   Chauncey Cruel, MD Medical Oncology and Hematology [facility] [address] [place], [place] [address] Tel. [phone]    Fax. [phone]     *Total Encounter Time as defined by the Centers for Medicare and Medicaid Services includes, in addition to the face-to-face time of a patient visit  (documented in the note above) non-face-to-face time: obtaining and reviewing outside history, ordering and reviewing medications, tests or procedures, care coordination (communications with other health care professionals or caregivers) and documentation in the medical record.

## 2021-05-12 ENCOUNTER — Encounter: Payer: Self-pay | Admitting: Adult Health

## 2021-05-12 ENCOUNTER — Encounter: Payer: Self-pay | Admitting: Oncology

## 2021-05-13 ENCOUNTER — Ambulatory Visit: Payer: 59

## 2021-05-13 ENCOUNTER — Other Ambulatory Visit: Payer: Self-pay

## 2021-05-13 DIAGNOSIS — Z170 Estrogen receptor positive status [ER+]: Secondary | ICD-10-CM

## 2021-05-13 DIAGNOSIS — M25612 Stiffness of left shoulder, not elsewhere classified: Secondary | ICD-10-CM | POA: Diagnosis not present

## 2021-05-13 DIAGNOSIS — R600 Localized edema: Secondary | ICD-10-CM

## 2021-05-13 DIAGNOSIS — C50412 Malignant neoplasm of upper-outer quadrant of left female breast: Secondary | ICD-10-CM

## 2021-05-13 DIAGNOSIS — L599 Disorder of the skin and subcutaneous tissue related to radiation, unspecified: Secondary | ICD-10-CM

## 2021-05-13 DIAGNOSIS — Z483 Aftercare following surgery for neoplasm: Secondary | ICD-10-CM

## 2021-05-13 DIAGNOSIS — R293 Abnormal posture: Secondary | ICD-10-CM

## 2021-05-13 NOTE — Therapy (Signed)
[place], [place], [address] Phone: [phone]   Fax:  [phone]  Physical Therapy Treatment  Patient Details  Name: Jeanne Evans MRN: [id number] Date of Birth: 1986-08-20 Referring Provider (PT): Dr. Erroll Luna   Encounter Date: 05/13/2021   PT End of Session - 05/13/21 1202     Visit Number 35    Number of Visits 38    Date for PT Re-Evaluation 06/17/21    PT Start Time 1102    PT Stop Time 1155    PT Time Calculation (min) 53 min    Activity Tolerance Patient tolerated treatment well    Behavior During Therapy [facility] for tasks assessed/performed             Past Medical History:  Diagnosis Date   Breast cancer ([place])    Family history of ovarian cancer    Family history of prostate cancer    GERD (gastroesophageal reflux disease)    History of asthma    has albuterol, uses PRN   History of heart murmur in childhood    History of multiple allergies     Past Surgical History:  Procedure Laterality Date   BREAST LUMPECTOMY WITH RADIOACTIVE SEED AND SENTINEL LYMPH NODE BIOPSY Left 11/19/2020   Procedure: LEFT BREAST LUMPECTOMY X 2 WITH RADIOACTIVE SEED AND SENTINEL LYMPH NODE MAPPING;  Surgeon: Erroll Luna, MD;  Location: [place];  Service: General;  Laterality: Left;  PECTORAL BLOCK   PORTACATH PLACEMENT Right 06/18/2020   Procedure: INSERTION PORT-A-CATH WITH ULTRASOUND GUIDANCE;  Surgeon: Erroll Luna, MD;  Location: [place];  Service: General;  Laterality: Right;   RE-EXCISION OF BREAST LUMPECTOMY Left 12/09/2020   Procedure: RE-EXCISION OF LEFT BREAST LUMPECTOMY;  Surgeon: Erroll Luna, MD;  Location: [place];  Service: General;  Laterality: Left;    There were no vitals filed for this visit.   Subjective Assessment - 05/13/21 1104     Subjective I was really sick last weekend after that last infusion. Last  weekend I was holding something with both hands and it fell.  I feel like I lost the feeling in both hands too when I was sweeping. They cancelled the infusion for July and gave me steroids.  The cough is better, but still there. I found my new sleeve. It helps the sensitivity in my arm. Having tingling in both hands intermittently.    Pertinent History Patient was diagnosed on 05/12/2020 with left breast cancer. She underwent neoadjuvant chemotherapy 06/19/2020-09/23/2020 and had a left lumpectomy and sentinel node biopsy (6 negative nodes) on 11/19/2020.  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    Patient Stated Goals Decrease pain and improve shoulder ROM    Currently in Pain? No/denies    Pain Score 5     Pain Location Arm   Numbness not pain   Pain Orientation Left    Pain Descriptors / Indicators Numbness    Pain Type Neuropathic pain    Pain Onset More than a month ago    Pain Frequency Intermittent    Multiple Pain Sites No                               [facility] Adult PT Treatment/Exercise - 05/13/21 0001       Shoulder Exercises: Standing   Horizontal ABduction Strengthening;Both;10 reps    External Rotation  Strengthening;Both;15 reps    Theraband Level (Shoulder External Rotation) Level 2 (Red)    Flexion Strengthening;Right;Left;10 reps    Extension Strengthening;Both;15 reps    Theraband Level (Shoulder Extension) Level 2 (Red)    Retraction Strengthening;15 reps    Theraband Level (Shoulder Retraction) Level 2 (Red)    Shoulder Elevation Strengthening;Right;Left;10 reps   yellow band   Other Standing Exercises elbow and ext  flex red bilateral x 15      Manual Therapy   Manual therapy comments checked left breast for redness/swelling; no redness today.  Nodular scar tissue still in incision otherwise swelling is improved.    Manual Lymphatic Drainage (MLD) Mld to supraclavicular, right axillary and left axillary LN,Anterior interaxillary pathway,  left axillo-inguinal pathway and left UE starting proximally and working distally on the arm, retracing all steps                         PT Long Term Goals - 05/06/21 1223       PT LONG TERM GOAL #1   Title Patient will demonstrate she has regained full shoulder ROM and function post operatively compared to baselines.    Time 8    Period Weeks    Status Achieved      PT LONG TERM GOAL #2   Title Patient will increase left shoulder active flexion to >/= 165 degrees for increased ease reaching.    Time 8    Period Weeks    Status Achieved      PT LONG TERM GOAL #3   Title Patient will increase left shoulder active abduction to >/= 165 degrees for increased ease reaching.    Period Weeks    Status Achieved      PT LONG TERM GOAL #4   Title Patient will improve her DASH score to be </= 20 for improved overall upper extremity function.    Baseline 70.45 post op, 29% (05/06/21)    Time 8    Period Weeks    Status On-going    Target Date 06/17/21      PT LONG TERM GOAL #5   Title Patient will report >/= 50% decrease in pain for increased tolerance of daily tasks.    Baseline 65% better    Time 8    Period Weeks    Status Achieved      PT LONG TERM GOAL #6   Title Pt will increase sit to stand in 30 seconds to 12 to indicate an improvement in functional strength    Baseline 7 on 03/03/2021, not assessed.  pt having a cough attack    Time 6    Period Weeks    Target Date 06/17/21                   Plan - 05/13/21 1204     Clinical Impression Statement Pt was able to find her new compression sleeve and notes sensitivity has been much better but still rates numbness at 5/10.  We continued bilateral UE strengthening with pt still fatiguing on the left.  She required VC's to do several exercises properly.  MLD performed to left UE after exs.  Pt. fatigued but with no increased pain. Will schedule to see a pulmonologist regarding her coughing    Personal  Factors and Comorbidities Comorbidity 3+    Comorbidities Left breast CA fuunctionally triple neg, chemo, radiation    Stability/Clinical Decision Making Stable/Uncomplicated    Rehab  Potential Excellent    PT Frequency 1x / week    PT Duration 6 weeks    PT Treatment/Interventions ADLs/Self Care Home Management;Therapeutic exercise;Patient/family education;Passive range of motion;Manual lymph drainage;Manual techniques;Scar mobilization    PT Next Visit Plan emphasis on strength, MFR,,, MLD prn, STM prn    PT Home Exercise Plan Post op shoulder ROM HEP, desensitization techniques, compression bra (now radiation bra) and sleeve, supine scap. series, standing TB, Left UE MLD    Consulted and Agree with Plan of Care Patient             Patient will benefit from skilled therapeutic intervention in order to improve the following deficits and impairments:  Postural dysfunction, Decreased range of motion, Impaired UE functional use, Pain, Decreased knowledge of precautions, Decreased scar mobility, Increased fascial restricitons, Increased edema, Impaired flexibility  Visit Diagnosis: Aftercare following surgery for neoplasm  Stiffness of left shoulder, not elsewhere classified  Abnormal posture  Localized edema  Disorder of the skin and subcutaneous tissue related to radiation, unspecified  Malignant neoplasm of upper-outer quadrant of left breast in female, estrogen receptor positive ([place])     Problem List Patient Active Problem List   Diagnosis Date Noted   Breast cancer ([place]) 05/05/2021   Lymphedema of left arm 02/02/2021   Drug-induced neutropenia ([place]) 09/30/2020   Genetic testing 06/26/2020   Family history of ovarian cancer    Family history of prostate cancer    Malignant neoplasm of upper-outer quadrant of left breast in female, estrogen receptor negative ([place]) 06/03/2020    Claris Pong 05/13/2021, 12:08 PM  [place], [place], [address] Phone: [phone]   Fax:  [phone]  Name: Jeanne Evans MRN: [id number] Date of Birth: 07-13-1986  Cheral Almas, PT 05/13/21 12:09 PM

## 2021-05-13 NOTE — Patient Instructions (Signed)
Access Code: [email or web address] URL: [email or web address] Date: 05/13/2021 Prepared by: Cheral Almas  Exercises Standing Elbow Extension with Self-Anchored Resistance - 1 x daily - 3 x weekly - 1-2 sets - 10 reps Standing Bicep Curls with Resistance - 1 x daily - 3 x weekly - 1-2 sets - 10 reps

## 2021-05-15 ENCOUNTER — Encounter: Payer: Self-pay | Admitting: Adult Health

## 2021-05-15 ENCOUNTER — Inpatient Hospital Stay: Payer: 59 | Attending: Oncology | Admitting: Adult Health

## 2021-05-15 ENCOUNTER — Other Ambulatory Visit: Payer: Self-pay

## 2021-05-15 DIAGNOSIS — Z171 Estrogen receptor negative status [ER-]: Secondary | ICD-10-CM

## 2021-05-15 DIAGNOSIS — C50412 Malignant neoplasm of upper-outer quadrant of left female breast: Secondary | ICD-10-CM | POA: Diagnosis not present

## 2021-05-15 DIAGNOSIS — R053 Chronic cough: Secondary | ICD-10-CM | POA: Diagnosis not present

## 2021-05-15 DIAGNOSIS — R059 Cough, unspecified: Secondary | ICD-10-CM | POA: Diagnosis not present

## 2021-05-15 MED ORDER — PREDNISONE 20 MG PO TABS: 20.0000 mg | ORAL_TABLET | Freq: Every day | ORAL | 0 refills | Status: DC

## 2021-05-15 NOTE — Progress Notes (Signed)
Jeanne Evans  [phone] [phone]     ID: Jeanne Evans DOB: [birth year]  MR#: [id number]  EZM#:[id number]  Patient Care Team: [name], MD as PCP - General (Obstetrics and Gynecology) Rockwell Germany, RN as Oncology Nurse Navigator Mauro Kaufmann, RN as Oncology Nurse Navigator Erroll Luna, MD as Consulting Physician (General Surgery) Magrinat, Virgie Dad, MD as Consulting Physician (Oncology) Kyung Rudd, MD as Consulting Physician (Radiation Oncology) Servando Salina, MD as Consulting Physician (Obstetrics and Gynecology) Scot Dock, NP OTHER MD:  CHIEF COMPLAINT: 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  I discussed the limitations, risks, security and privacy concerns of performing an evaluation and management service by telephone and the availability of in person appointments.  I also discussed with the patient that there may be a patient responsible charge related to this service. The patient expressed understanding and agreed to proceed.   Patient location: home Provider location: [facility] office Others participating in call: none   CURRENT TREATMENT: pembrolizumab on hold   INTERVAL HISTORY: Jeanne Evans returns today for follow up of her 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 She completed chemotherapy with Doxorubicin and cyclophsopahmide followed by weekly Paclitaxel and Carboplatin x 4 weekly cycles stopped early due to early peripheral neuropathy.  She has noted some ? Motor changes in her hands recently.  After finishing her chemo she then underwent surgery, and completed radiation therapy in April. She received two doses of Pembrolizumab on 04/16/2021 and 05/09/2021.    Jeanne Evans was seen earlier this week for a cough.  She met with myself and Dr. Jana Hakim and  have decided to refer her to pulmonology, hold her pembrolizumab, and start her on steroids at 42m/day for 5 days.  She notes her cough is improved, but not gone.  She says it isn't as deep as it was earlier this week, however it is still present and bothers her.     REVIEW OF SYSTEMS: Review of Systems  Constitutional:  Negative for appetite change, chills, fatigue and fever.  HENT:   Negative for hearing loss.   Eyes:  Negative for eye problems and icterus.  Respiratory:  Positive for cough. Negative for chest tightness, hemoptysis, shortness of breath and wheezing.   Cardiovascular:  Negative for chest pain, leg swelling and palpitations.  Gastrointestinal:  Negative for abdominal distention, abdominal pain, blood in stool, constipation, diarrhea, nausea and vomiting.  Endocrine: Negative for hot flashes.  Genitourinary:  Negative for difficulty urinating.   Musculoskeletal:  Negative for arthralgias.  Skin:  Negative for itching and rash.  Neurological:  Negative for dizziness, extremity weakness, headaches and numbness.  Hematological:  Negative for adenopathy. Does not bruise/bleed easily.  Psychiatric/Behavioral:  Negative for depression. The patient is not nervous/anxious.    COVID 19 VACCINATION STATUS: [place]x2, most recently 01/2021  HISTORY OF CURRENT ILLNESS: From the original intake note:  Jeanne Evans herself palpated a painful left breast lump. She underwent bilateral diagnostic mammography with tomography and [facility] showing: breast density category B; palpable 2.7 cm oval hypoechoic mass in left breast at 2-3 o'clock; 2.2 cm left axillary lymph node/grouping of nodes.  Accordingly on 05/29/2020 she proceeded to biopsy of the left breast area in question. The pathology from this procedure ([id number]) showed: invasive ductal carcinoma, grade 3.  Prognostic indicators significant for: estrogen receptor, 10%  positive with weak staining intensity and progesterone receptor, 0% negative. Proliferation marker Ki67 at 95%. HER2 equivocal by immunohistochemistry (2+), but negative by fluorescent in situ hybridization with a signals ratio 1.77 and number per cell 2.65.  Biopsy of the left axillary lymph node in question was negative and concordant  The patient's subsequent history is as detailed below.   PAST MEDICAL HISTORY: Past Medical History:  Diagnosis Date   Breast cancer ([place])    Family history of ovarian cancer    Family history of prostate cancer    GERD (gastroesophageal reflux disease)    History of asthma    has albuterol, uses PRN   History of heart murmur in childhood    History of multiple allergies   History of allergy related headaches   PAST SURGICAL HISTORY: Past Surgical History:  Procedure Laterality Date   BREAST LUMPECTOMY WITH RADIOACTIVE SEED AND SENTINEL LYMPH NODE BIOPSY Left 11/19/2020   Procedure: LEFT BREAST LUMPECTOMY X 2 WITH RADIOACTIVE SEED AND SENTINEL LYMPH NODE MAPPING;  Surgeon: Erroll Luna, MD;  Location: [place];  Service: General;  Laterality: Left;  PECTORAL BLOCK   PORTACATH PLACEMENT Right 06/18/2020   Procedure: INSERTION PORT-A-CATH WITH ULTRASOUND GUIDANCE;  Surgeon: Erroll Luna, MD;  Location: [place];  Service: General;  Laterality: Right;   RE-EXCISION OF BREAST LUMPECTOMY Left 12/09/2020   Procedure: RE-EXCISION OF LEFT BREAST LUMPECTOMY;  Surgeon: Erroll Luna, MD;  Location: [place];  Service: General;  Laterality: Left;  She reports having a boil lanced from her perineal area   FAMILY HISTORY: Family History  Problem Relation Age of Onset   Lung cancer Maternal Uncle        dx late 34s   Prostate cancer Paternal Uncle        dx late 31s   Ovarian cancer Paternal Grandmother        dx 74s   Prostate cancer Paternal Grandfather        dx 59s   Prostate cancer Paternal  Uncle        dx early 34s  The patient's father is 47 and her mother 10, as of 05/2020.  The patient has two brothers and one sister. She reports ovarian cancer in her paternal grandmother, prostate cancer in her paternal grandfather and a paternal uncle, and lung cancer in a maternal uncle.   GYNECOLOGIC HISTORY:  No LMP recorded. (Menstrual status: IUD). Menarche: 36 years old Age at first live birth: 35 years old [place] P 2 LMP current, experiences spotting Contraceptive: Mirena IUD in place HRT n/a  Hysterectomy? no BSO? 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 She describes herself as single. She lives at home with her children, Jeanne Evans (age 82) and Jeanne Evans (age 30).  She is originally from [place] her home.    ADVANCED DIRECTIVES: Not in place. She intends to name her mother, Jeanne Evans, as her HCPOA.  Jeanne Evans lives in [place] and can be reached at [phone].  The patient was given the appropriate documents to complete and notarized at her discretion at the time of her visit 06/04/2020   HEALTH MAINTENANCE: Social History   Tobacco Use   Smoking status: Never   Smokeless tobacco: Never  Substance Use Topics   Alcohol use: Yes    Comment: occas  Drug use: Yes    Types: Marijuana     Colonoscopy: n/a (age)  PAP: 04/2020  Bone density: n/a (age)   Allergies  Allergen Reactions   Sulfa Antibiotics Hives    Current Outpatient Medications  Medication Sig Dispense Refill   Acetaminophen (TYLENOL) 325 MG CAPS Take by mouth.     benzonatate (TESSALON) 100 MG capsule Take 1 capsule (100 mg total) by mouth 2 (two) times daily as needed for cough. (Patient not taking: Reported on 05/11/2021) 60 capsule 1   Dextromethorphan-guaiFENesin (DELSYM COUGH/CHEST CONGEST DM PO) Take 2 tablets by mouth every 8 (eight) hours as needed.      gabapentin (NEURONTIN) 300 MG capsule Take 1 capsule (300 mg total) by mouth at bedtime. (Patient not taking: Reported on 05/11/2021) 90 capsule 4   ibuprofen (ADVIL) 800 MG tablet Take 1 tablet (800 mg total) by mouth every 8 (eight) hours as needed. 30 tablet 0   lidocaine-prilocaine (EMLA) cream Apply to affected area once 30 g 3   metroNIDAZOLE (FLAGYL) 500 MG tablet Take 500 mg by mouth 2 (two) times daily.     omeprazole (PRILOSEC) 40 MG capsule Take 1 capsule (40 mg total) by mouth at bedtime. 30 capsule 3   predniSONE (DELTASONE) 20 MG tablet Take 1 tablet (20 mg total) by mouth daily with breakfast. 5 tablet 0   valACYclovir (VALTREX) 500 MG tablet Take 500 mg by mouth 2 (two) times daily.     venlafaxine XR (EFFEXOR-XR) 75 MG 24 hr capsule Take 1 capsule (75 mg total) by mouth daily with breakfast. 90 capsule 4   No current facility-administered medications for this visit.    OBJECTIVE:  There were no vitals filed for this visit.    There is no height or weight on file to calculate BMI.   Wt Readings from Last 3 Encounters:  05/11/21 181 lb 1.6 oz (82.1 kg)  05/08/21 183 lb 4 oz (83.1 kg)  04/16/21 180 lb 12.4 oz (82 kg)  ECOG FS:1 - Symptomatic but completely ambulatory Patient sounds well, speech normal, mood and behavior normal, no apparent distress noted    LAB RESULTS:  CMP     Component Value Date/Time   NA 139 05/08/2021 1100   K 4.1 05/08/2021 1100   CL 105 05/08/2021 1100   CO2 25 05/08/2021 1100   GLUCOSE 93 05/08/2021 1100   BUN 14 05/08/2021 1100   CREATININE 0.80 05/08/2021 1100   CREATININE 0.92 06/04/2020 0830   CALCIUM 9.1 05/08/2021 1100   PROT 7.5 05/08/2021 1100   ALBUMIN 4.0 05/08/2021 1100   AST 19 05/08/2021 1100   AST 13 (L) 06/04/2020 0830   ALT 19 05/08/2021 1100   ALT 14 06/04/2020 0830   ALKPHOS 83 05/08/2021 1100   BILITOT 0.5 05/08/2021 1100   BILITOT 0.5 06/04/2020 0830   GFRNONAA >60 05/08/2021 1100   GFRNONAA >60 06/04/2020 0830    GFRAA >60 08/05/2020 0955   GFRAA >60 06/04/2020 0830    No results found for: TOTALPROTELP, ALBUMINELP, A1GS, A2GS, BETS, BETA2SER, GAMS, MSPIKE, SPEI  Lab Results  Component Value Date   WBC 4.0 05/08/2021   NEUTROABS 1.8 05/08/2021   HGB 12.1 05/08/2021   HCT 35.2 (L) 05/08/2021   MCV 88.0 05/08/2021   PLT 240 05/08/2021    No results found for: LABCA2  No components found for: IOEVOJ500  No results for input(s): INR in the last 168 hours.  No results found for: LABCA2  No results  found for: FBP102  No results found for: HEN277  No results found for: OEU235  No results found for: CA2729  No components found for: HGQUANT  No results found for: CEA1 / No results found for: CEA1   No results found for: AFPTUMOR  No results found for: CHROMOGRNA  No results found for: KPAFRELGTCHN, LAMBDASER, KAPLAMBRATIO (kappa/lambda light chains)  No results found for: HGBA, HGBA2QUANT, HGBFQUANT, HGBSQUAN (Hemoglobinopathy evaluation)   No results found for: LDH  No results found for: IRON, TIBC, IRONPCTSAT (Iron and TIBC)  No results found for: FERRITIN  Urinalysis No results found for: COLORURINE, APPEARANCEUR, LABSPEC, PHURINE, GLUCOSEU, HGBUR, BILIRUBINUR, KETONESUR, PROTEINUR, UROBILINOGEN, NITRITE, LEUKOCYTESUR   STUDIES: DG Chest 2 View  Result Date: 04/30/2021 CLINICAL DATA:  Cough over the last few months. History of breast cancer. EXAM: CHEST - 2 VIEW COMPARISON:  06/18/2020 FINDINGS: Heart size is normal. Mediastinal shadows are normal. Power port inserted from a right internal jugular approach has its tip in the SVC at the azygos level. The lungs are clear. No infiltrate, effusion, collapse, mass or nodule. Surgical clips in the region of the left breast and axilla. IMPRESSION: No active disease. Electronically Signed   By: Nelson Chimes M.D.   On: 04/30/2021 10:55   US BREAST LTD UNI LEFT INC AXILLA  Result Date: 05/05/2021 CLINICAL DATA:  Surgery for  breast cancer January 2022. Palpable lump at the postsurgical site. EXAM: DIGITAL DIAGNOSTIC BILATERAL MAMMOGRAM WITH TOMOSYNTHESIS AND CAD; ULTRASOUND LEFT BREAST LIMITED TECHNIQUE: Bilateral digital diagnostic mammography and breast tomosynthesis was performed. The images were evaluated with computer-aided detection.; Targeted ultrasound examination of the left breast was performed COMPARISON:  Previous exam(s). ACR Breast Density Category b: There are scattered areas of fibroglandular density. FINDINGS: The BB marking the palpable site is located at the site of the patient's surgery. The lumpectomy site appears as expected. No suspicious masses seen in either breast. Skin thickening on the left is consistent with radiation. On physical exam, there is a lump at the scar. Targeted ultrasound is performed, showing scarring at the left lumpectomy site. No focal mass. IMPRESSION: The mammographic and sonographic findings are consistent with scarring. No evidence of malignancy in either breast. RECOMMENDATION: Treatment of the patient's symptoms should be based on clinical and physical exam given lack of imaging findings. Recommend annual diagnostic mammography. I have discussed the findings and recommendations with the patient. If applicable, a reminder letter will be sent to the patient regarding the next appointment. BI-RADS CATEGORY  2: Benign. Electronically Signed   By: Dorise Bullion III M.D   On: 05/05/2021 16:40  MM DIAG BREAST TOMO BILATERAL  Result Date: 05/05/2021 CLINICAL DATA:  Surgery for breast cancer January 2022. Palpable lump at the postsurgical site. EXAM: DIGITAL DIAGNOSTIC BILATERAL MAMMOGRAM WITH TOMOSYNTHESIS AND CAD; ULTRASOUND LEFT BREAST LIMITED TECHNIQUE: Bilateral digital diagnostic mammography and breast tomosynthesis was performed. The images were evaluated with computer-aided detection.; Targeted ultrasound examination of the left breast was performed COMPARISON:  Previous exam(s).  ACR Breast Density Category b: There are scattered areas of fibroglandular density. FINDINGS: The BB marking the palpable site is located at the site of the patient's surgery. The lumpectomy site appears as expected. No suspicious masses seen in either breast. Skin thickening on the left is consistent with radiation. On physical exam, there is a lump at the scar. Targeted ultrasound is performed, showing scarring at the left lumpectomy site. No focal mass. IMPRESSION: The mammographic and sonographic findings are consistent with scarring.  No evidence of malignancy in either breast. RECOMMENDATION: Treatment of the patient's symptoms should be based on clinical and physical exam given lack of imaging findings. Recommend annual diagnostic mammography. I have discussed the findings and recommendations with the patient. If applicable, a reminder letter will be sent to the patient regarding the next appointment. BI-RADS CATEGORY  2: Benign. Electronically Signed   By: Dorise Bullion III M.D   On: 05/05/2021 16:40    ELIGIBLE FOR AVAILABLE RESEARCH PROTOCOL: no  ASSESSMENT: 35 y.o. [place] woman status post left breast upper outer quadrant biopsy 05/29/2020 for a clinical T2 N0, stage IIB invasive ductal carcinoma, functionally triple negative, with an MIB-1 of 95%.  (1) genetics testing 06/23/2020 through the [facility] Multi-Cancer Panel found no deleterious mutations in AIP, ALK, APC, ATM, AXIN2,BAP1,  BARD1, BLM, BMPR1A, BRCA1, BRCA2, BRIP1, CASR, CDC73, CDH1, CDK4, CDKN1B, CDKN1C, CDKN2A (p14ARF), CDKN2A (p16INK4a), CEBPA, CHEK2, CTNNA1, DICER1, DIS3L2, EGFR (c.2369C>T, p.Thr790Met variant only), EPCAM (Deletion/duplication testing only), FH, FLCN, GATA2, GPC3, GREM1 (Promoter region deletion/duplication testing only), HOXB13 (c.251G>A, p.Gly84Glu), HRAS, KIT, MAX, MEN1, MET, MITF (c.952G>A, p.Glu318Lys variant only), MLH1, MSH2, MSH3, MSH6, MUTYH, NBN, NF1, NF2, NTHL1, PALB2, PDGFRA, PHOX2B, PMS2, POLD1,  POLE, POT1, PRKAR1A, PTCH1, PTEN, RAD50, RAD51C, RAD51D, RB1, RECQL4, RET, RNF43, RUNX1, SDHAF2, SDHA (sequence changes only), SDHB, SDHC, SDHD, SMAD4, SMARCA4, SMARCB1, SMARCE1, STK11, SUFU, TERC, TERT, TMEM127, TP53, TSC1, TSC2, VHL, WRN and WT1.   (2) neoadjuvant chemotherapy consisting of cyclophosphamide and doxorubicin in dose dense fashion x4 started 06/19/2020, completed 08/05/2020, followed by paclitaxel and carboplatin weekly x12 starting 08/26/2020, completing 4 doses 09/23/2020  (a) echocardiogram on 06/13/2020 showed an EF of 60-65%  (b) paclitaxel and carboplatin discontinued after 4 doses because of neuropathy and cytopenias  (3) status post left lumpectomy and sentinel lymph node sampling 11/19/2020 for a residual yp T1c ypN0 invasive ductal carcinoma involving the superior margin  (a) additional surgery 12/09/2020 cleared the margin in question  (b) repeat prognostic panel confirms triple negative disease  (4) postmastectomy radiation started 01/22/2021:   (a) receiving capecitabine sensitization, started 01/26/2021  (5) pembrolizumab/Keytruda began on 04/16/2021   PLAN: Timia and I had a telephone visit today to discuss her cough.  It is improving, and the steroids are working.  She only has one day left of steroids.  Due to this I sent in another 5 days.  I think this will help get the cough to a more reasonable state.    I reviewed Anamaria's CT chest from last year.  Considering her cough, and negative chest xray, I placed an order to f/u on the pulmonary nodules prior to her appointment with Dr. Chase Caller.    We will see Kenlea back in August for labs and f/u.  She knows to call for any questions that may arise between now and her next appointment.  We are happy to see her sooner if needed.  Follow up instructions:    -Return to cancer center in 06/2021   -CT chest orders placed -Follow up with Dr. Chase Caller as scheduled in August -Prednisone 63m x5 days to  equal total of 10 days treatment sent to her pharmacy   The patient was provided an opportunity to ask questions and all were answered. The patient agreed with the plan and demonstrated an understanding of the instructions.   The patient was advised to call back or seek an in-person evaluation if the symptoms worsen or if the condition fails to improve as anticipated.   I provided  12 minutes of non face-to-face telephone visit time during this encounter, and > 50% was spent counseling as documented under my assessment & plan.    Wilber Bihari, NP 05/15/21 9:47 AM Medical Oncology and Hematology [facility] [place], [place] [address] Tel. [phone]    Fax. [phone]   *Total Encounter Time as defined by the Centers for Medicare and Medicaid Services includes, in addition to the face-to-face time of a patient visit (documented in the note above) non-face-to-face time: obtaining and reviewing outside history, ordering and reviewing medications, tests or procedures, care coordination (communications with other health care professionals or caregivers) and documentation in the medical record.

## 2021-05-25 ENCOUNTER — Other Ambulatory Visit: Payer: Self-pay

## 2021-05-25 ENCOUNTER — Ambulatory Visit: Payer: 59 | Attending: Surgery

## 2021-05-25 DIAGNOSIS — Z170 Estrogen receptor positive status [ER+]: Secondary | ICD-10-CM | POA: Insufficient documentation

## 2021-05-25 DIAGNOSIS — Z483 Aftercare following surgery for neoplasm: Secondary | ICD-10-CM | POA: Insufficient documentation

## 2021-05-25 DIAGNOSIS — R293 Abnormal posture: Secondary | ICD-10-CM | POA: Insufficient documentation

## 2021-05-25 DIAGNOSIS — L599 Disorder of the skin and subcutaneous tissue related to radiation, unspecified: Secondary | ICD-10-CM | POA: Insufficient documentation

## 2021-05-25 DIAGNOSIS — M25612 Stiffness of left shoulder, not elsewhere classified: Secondary | ICD-10-CM | POA: Insufficient documentation

## 2021-05-25 DIAGNOSIS — C50412 Malignant neoplasm of upper-outer quadrant of left female breast: Secondary | ICD-10-CM | POA: Insufficient documentation

## 2021-05-25 DIAGNOSIS — R600 Localized edema: Secondary | ICD-10-CM | POA: Insufficient documentation

## 2021-05-25 NOTE — Therapy (Signed)
[place], [place], [address] Phone: [phone]   Fax:  [phone]  Physical Therapy Treatment  Patient Details  Name: Jeanne Evans MRN: [id number] Date of Birth: 01-03-1986 Referring Provider (PT): Dr. Erroll Luna   Encounter Date: 05/25/2021   PT End of Session - 05/25/21 1159     Visit Number 35   # unchanged due to screen only   PT Start Time 1149    PT Stop Time 1158    PT Time Calculation (min) 9 min    Activity Tolerance Patient tolerated treatment well    Behavior During Therapy [facility] for tasks assessed/performed             Past Medical History:  Diagnosis Date   Breast cancer ([place])    Family history of ovarian cancer    Family history of prostate cancer    GERD (gastroesophageal reflux disease)    History of asthma    has albuterol, uses PRN   History of heart murmur in childhood    History of multiple allergies     Past Surgical History:  Procedure Laterality Date   BREAST LUMPECTOMY WITH RADIOACTIVE SEED AND SENTINEL LYMPH NODE BIOPSY Left 11/19/2020   Procedure: LEFT BREAST LUMPECTOMY X 2 WITH RADIOACTIVE SEED AND SENTINEL LYMPH NODE MAPPING;  Surgeon: Erroll Luna, MD;  Location: [place];  Service: General;  Laterality: Left;  PECTORAL BLOCK   PORTACATH PLACEMENT Right 06/18/2020   Procedure: INSERTION PORT-A-CATH WITH ULTRASOUND GUIDANCE;  Surgeon: Erroll Luna, MD;  Location: [facility];  Service: General;  Laterality: Right;   RE-EXCISION OF BREAST LUMPECTOMY Left 12/09/2020   Procedure: RE-EXCISION OF LEFT BREAST LUMPECTOMY;  Surgeon: Erroll Luna, MD;  Location: [place];  Service: General;  Laterality: Left;    There were no vitals filed for this visit.   Subjective Assessment - 05/25/21 1154     Subjective Pt returns after wearing compression sleeve x1 month due to having subclinical lymphedema at last  [facility] screen.    Pertinent History Patient was diagnosed on 05/12/2020 with left breast cancer. She underwent neoadjuvant chemotherapy 06/19/2020-09/23/2020 and had a left lumpectomy and sentinel node biopsy (6 negative nodes) on 11/19/2020.  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                    L-DEX FLOWSHEETS - 05/25/21 1100       L-DEX LYMPHEDEMA SCREENING   Measurement Type Unilateral    L-DEX MEASUREMENT EXTREMITY Upper Extremity    POSITION  Standing    DOMINANT SIDE Right    At Risk Side Left    BASELINE SCORE (UNILATERAL) -1.2    L-DEX SCORE (UNILATERAL) 3.7    VALUE CHANGE (UNILAT) 4.9                                    PT Long Term Goals - 05/06/21 1223       PT LONG TERM GOAL #1   Title Patient will demonstrate she has regained full shoulder ROM and function post operatively compared to baselines.    Time 8    Period Weeks    Status Achieved      PT LONG TERM GOAL #2   Title Patient will increase left shoulder active flexion to >/= 165 degrees for increased ease reaching.  Time 8    Period Weeks    Status Achieved      PT LONG TERM GOAL #3   Title Patient will increase left shoulder active abduction to >/= 165 degrees for increased ease reaching.    Period Weeks    Status Achieved      PT LONG TERM GOAL #4   Title Patient will improve her DASH score to be </= 20 for improved overall upper extremity function.    Baseline 70.45 post op, 29% (05/06/21)    Time 8    Period Weeks    Status On-going    Target Date 06/17/21      PT LONG TERM GOAL #5   Title Patient will report >/= 50% decrease in pain for increased tolerance of daily tasks.    Baseline 65% better    Time 8    Period Weeks    Status Achieved      PT LONG TERM GOAL #6   Title Pt will increase sit to stand in 30 seconds to 12 to indicate an improvement in functional strength    Baseline 7 on 03/03/2021, not assessed.  pt having a cough attack     Time 6    Period Weeks    Target Date 06/17/21                   Plan - 05/25/21 1200     Clinical Impression Statement Pt returns for 1 month L-Dex screen. She has tested higher than the 6.5 score last 2 months which determines subclinical lymphedema. Today her change from baseline of 4.9 is WNLs so educated pt that she does not need to wear her compression sleeve 10 hrs/day any longer, however, since it took 2 months for her baseline to return to normal limits, it would benefit her to cont wearing her compression sleeve at times of increased activity and during work. Pt verbalized understanding this and agreed.    PT Next Visit Plan emphasis on strength, MFR,,, MLD prn, STM prn ; resume every 3 month L-Dex screens for up to 2 years from her SLNB.    Consulted and Agree with Plan of Care Patient             Patient will benefit from skilled therapeutic intervention in order to improve the following deficits and impairments:     Visit Diagnosis: Aftercare following surgery for neoplasm     Problem List Patient Active Problem List   Diagnosis Date Noted   Breast cancer ([place]) 05/05/2021   Lymphedema of left arm 02/02/2021   Drug-induced neutropenia ([place]) 09/30/2020   Genetic testing 06/26/2020   Family history of ovarian cancer    Family history of prostate cancer    Malignant neoplasm of upper-outer quadrant of left breast in female, estrogen receptor negative ([place]) 06/03/2020    Otelia Limes, PTA 05/25/2021, 12:03 PM  [place], [place], [address] Phone: [phone]   Fax:  [phone]  Name: Jeanne Evans MRN: [id number] Date of Birth: 12-Oct-1986

## 2021-05-27 ENCOUNTER — Encounter: Payer: Self-pay | Admitting: *Deleted

## 2021-05-27 ENCOUNTER — Ambulatory Visit: Payer: 59

## 2021-05-27 ENCOUNTER — Other Ambulatory Visit: Payer: Self-pay

## 2021-05-27 DIAGNOSIS — M25612 Stiffness of left shoulder, not elsewhere classified: Secondary | ICD-10-CM

## 2021-05-27 DIAGNOSIS — Z483 Aftercare following surgery for neoplasm: Secondary | ICD-10-CM | POA: Diagnosis present

## 2021-05-27 DIAGNOSIS — L599 Disorder of the skin and subcutaneous tissue related to radiation, unspecified: Secondary | ICD-10-CM | POA: Diagnosis present

## 2021-05-27 DIAGNOSIS — R293 Abnormal posture: Secondary | ICD-10-CM

## 2021-05-27 DIAGNOSIS — Z170 Estrogen receptor positive status [ER+]: Secondary | ICD-10-CM | POA: Diagnosis present

## 2021-05-27 DIAGNOSIS — C50412 Malignant neoplasm of upper-outer quadrant of left female breast: Secondary | ICD-10-CM

## 2021-05-27 DIAGNOSIS — R600 Localized edema: Secondary | ICD-10-CM

## 2021-05-27 NOTE — Patient Instructions (Signed)
Pt was given ABC strength packet and was instructed to perform exercises as instructed that we did today.

## 2021-05-27 NOTE — Therapy (Signed)
[place], [place], [address] Phone: [phone]   Fax:  [phone]  Physical Therapy Treatment  Patient Details  Name: Jeanne Evans MRN: [id number] Date of Birth: 1986-06-03 Referring Provider (PT): Dr. Erroll Luna   Encounter Date: 05/27/2021   PT End of Session - 05/27/21 1101     Visit Number 36    Number of Visits 38    Date for PT Re-Evaluation 06/17/21    PT Start Time 1013    PT Stop Time 1055    PT Time Calculation (min) 42 min    Activity Tolerance Patient tolerated treatment well    Behavior During Therapy [facility] for tasks assessed/performed             Past Medical History:  Diagnosis Date   Breast cancer ([place])    Family history of ovarian cancer    Family history of prostate cancer    GERD (gastroesophageal reflux disease)    History of asthma    has albuterol, uses PRN   History of heart murmur in childhood    History of multiple allergies     Past Surgical History:  Procedure Laterality Date   BREAST LUMPECTOMY WITH RADIOACTIVE SEED AND SENTINEL LYMPH NODE BIOPSY Left 11/19/2020   Procedure: LEFT BREAST LUMPECTOMY X 2 WITH RADIOACTIVE SEED AND SENTINEL LYMPH NODE MAPPING;  Surgeon: Erroll Luna, MD;  Location: [place];  Service: General;  Laterality: Left;  PECTORAL BLOCK   PORTACATH PLACEMENT Right 06/18/2020   Procedure: INSERTION PORT-A-CATH WITH ULTRASOUND GUIDANCE;  Surgeon: Erroll Luna, MD;  Location: [place];  Service: General;  Laterality: Right;   RE-EXCISION OF BREAST LUMPECTOMY Left 12/09/2020   Procedure: RE-EXCISION OF LEFT BREAST LUMPECTOMY;  Surgeon: Erroll Luna, MD;  Location: [place];  Service: General;  Laterality: Left;    There were no vitals filed for this visit.   Subjective Assessment - 05/27/21 1013     Subjective Ended the other round of steroids for the cough and it started  back again with a dry cough and I am losing sleep. Still get some of the numbness in my hands but not as often.  It comes and goes.  Trying to walk more and exercise some.   Pertinent History Patient was diagnosed on 05/12/2020 with left breast cancer. She underwent neoadjuvant chemotherapy 06/19/2020-09/23/2020 and had a left lumpectomy and sentinel node biopsy (6 negative nodes) on 11/19/2020.  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 Numbness in posterior arm is a little better.    Patient Stated Goals Decrease pain and improve shoulder ROM    Currently in Pain? No/denies    Pain Score 0-No pain                               [facility] Adult PT Treatment/Exercise - 05/27/21 0001       Exercises   Exercises Other Exercises   Pt was taken through the ABC strength handout and performed all exs from stretches, to core exercises and up to mini squat exercises.                   PT Education - 05/27/21 1100     Education Details pt educated in ABC stretches, core exs and other exercises up through mini squats    Person(s) Educated Patient    Methods Demonstration;Handout  Comprehension Returned demonstration;Verbal cues required;Tactile cues required                 PT Long Term Goals - 05/06/21 1223       PT LONG TERM GOAL #1   Title Patient will demonstrate she has regained full shoulder ROM and function post operatively compared to baselines.    Time 8    Period Weeks    Status Achieved      PT LONG TERM GOAL #2   Title Patient will increase left shoulder active flexion to >/= 165 degrees for increased ease reaching.    Time 8    Period Weeks    Status Achieved      PT LONG TERM GOAL #3   Title Patient will increase left shoulder active abduction to >/= 165 degrees for increased ease reaching.    Period Weeks    Status Achieved      PT LONG TERM GOAL #4   Title Patient will improve her DASH score to be </= 20 for improved overall  upper extremity function.    Baseline 70.45 post op, 29% (05/06/21)    Time 8    Period Weeks    Status On-going    Target Date 06/17/21      PT LONG TERM GOAL #5   Title Patient will report >/= 50% decrease in pain for increased tolerance of daily tasks.    Baseline 65% better    Time 8    Period Weeks    Status Achieved      PT LONG TERM GOAL #6   Title Pt will increase sit to stand in 30 seconds to 12 to indicate an improvement in functional strength    Baseline 7 on 03/03/2021, not assessed.  pt having a cough attack    Time 6    Period Weeks    Target Date 06/17/21                   Plan - 05/27/21 1102     Clinical Impression Statement Patient was instructed in and taken through [facility] strength handout from stretches, to core exercises to mini squats.  She did very well but did fatigue.  She required Verbal cues and occasional tactile cues for proper form with quad stab exs and chest press activity.  She had difficulty getting into butterfly sitting position to stretch so modified this to sitting with ankle crossed over opposite knee.  She was given the packet to  read through and try at home    Personal Factors and Comorbidities Comorbidity 3+    Comorbidities Left breast CA fuunctionally triple neg, chemo, radiation    Stability/Clinical Decision Making Stable/Uncomplicated    Rehab Potential Excellent    PT Frequency 1x / week    PT Duration 6 weeks    PT Treatment/Interventions ADLs/Self Care Home Management;Therapeutic exercise;Patient/family education;Passive range of motion;Manual lymph drainage;Manual techniques;Scar mobilization    PT Next Visit Plan Review ABC packet and instruct from minisquats to the end    Consulted and Agree with Plan of Care Patient             Patient will benefit from skilled therapeutic intervention in order to improve the following deficits and impairments:  Postural dysfunction, Decreased range of motion, Impaired UE functional  use, Pain, Decreased knowledge of precautions, Decreased scar mobility, Increased fascial restricitons, Increased edema, Impaired flexibility  Visit Diagnosis: Aftercare following surgery for neoplasm  Stiffness of left shoulder,  not elsewhere classified  Abnormal posture  Localized edema  Disorder of the skin and subcutaneous tissue related to radiation, unspecified  Malignant neoplasm of upper-outer quadrant of left breast in female, estrogen receptor positive ([place])     Problem List Patient Active Problem List   Diagnosis Date Noted   Breast cancer ([place]) 05/05/2021   Lymphedema of left arm 02/02/2021   Drug-induced neutropenia ([place]) 09/30/2020   Genetic testing 06/26/2020   Family history of ovarian cancer    Family history of prostate cancer    Malignant neoplasm of upper-outer quadrant of left breast in female, estrogen receptor negative ([place]) 06/03/2020    Claris Pong 05/27/2021, 11:26 AM  [facility] [place], [place], [address] Phone: [phone]   Fax:  [phone]  Name: RAIN WILHIDE MRN: [id number] Date of Birth: 13-Feb-1986 Cheral Almas, PT 05/27/21 11:28 AM

## 2021-05-28 ENCOUNTER — Ambulatory Visit: Payer: 59 | Admitting: Adult Health

## 2021-05-28 ENCOUNTER — Ambulatory Visit: Payer: 59

## 2021-05-28 ENCOUNTER — Other Ambulatory Visit: Payer: Self-pay | Admitting: Oncology

## 2021-05-28 ENCOUNTER — Other Ambulatory Visit: Payer: 59

## 2021-05-28 MED ORDER — PROMETHAZINE-CODEINE 6.25-10 MG/5ML PO SYRP: 5.0000 mL | ORAL_SOLUTION | Freq: Three times a day (TID) | ORAL | 0 refills | Status: DC | PRN

## 2021-05-28 NOTE — Progress Notes (Unsigned)
Jeanne Evans continues to have uncontrolled cough after her COVID.  This has been disabling for her.  We are going to try Phenergan codeine syrup.  If that does not work we are going to go with a Medrol Dosepak as the steroids she previously received did help.  She does have an appointment with pulmonology.  It could be that this is really asthma and she needs asthma specific treatment for this

## 2021-05-29 ENCOUNTER — Encounter (HOSPITAL_COMMUNITY): Payer: Self-pay

## 2021-05-29 ENCOUNTER — Ambulatory Visit (HOSPITAL_COMMUNITY)
Admission: RE | Admit: 2021-05-29 | Discharge: 2021-05-29 | Disposition: A | Discharge status: Home / Self Care | Payer: 59 | Source: Ambulatory Visit | Attending: Adult Health | Admitting: Adult Health

## 2021-05-29 ENCOUNTER — Other Ambulatory Visit: Payer: Self-pay

## 2021-05-29 DIAGNOSIS — C50412 Malignant neoplasm of upper-outer quadrant of left female breast: Secondary | ICD-10-CM | POA: Insufficient documentation

## 2021-05-29 DIAGNOSIS — Z171 Estrogen receptor negative status [ER-]: Secondary | ICD-10-CM | POA: Insufficient documentation

## 2021-05-29 DIAGNOSIS — R059 Cough, unspecified: Secondary | ICD-10-CM | POA: Diagnosis present

## 2021-05-29 DIAGNOSIS — R053 Chronic cough: Secondary | ICD-10-CM | POA: Diagnosis present

## 2021-05-29 MED ORDER — SODIUM CHLORIDE (PF) 0.9 % IJ SOLN
INTRAMUSCULAR | Status: AC
  Filled 2021-05-29: qty 50

## 2021-05-29 MED ORDER — IOHEXOL 350 MG/ML SOLN
60.0000 mL | Freq: Once | INTRAVENOUS | Status: AC | PRN
  Administered 2021-05-29: 60 mL via INTRAVENOUS

## 2021-06-01 ENCOUNTER — Encounter: Payer: Self-pay | Admitting: *Deleted

## 2021-06-09 ENCOUNTER — Telehealth: Payer: Self-pay | Admitting: *Deleted

## 2021-06-09 MED ORDER — METHYLPREDNISOLONE 4 MG PO TBPK: ORAL_TABLET | ORAL | 0 refills | Status: DC

## 2021-06-09 NOTE — Telephone Encounter (Signed)
This RN spoke with patient per her call stating cough is still occurring despite use of phenergan with codeine cough syrup.  She states at night she coughs with coughing episode last night " so hard I have sore chest muscles and then vomited "  Note cough has been ongoing since having Covid.  She is scheduled to see pulmonologist in August.  Per MD review - order given for a steroid taper dose.  Informed patient and verified pharmacy- prescription sent.

## 2021-06-10 ENCOUNTER — Other Ambulatory Visit: Payer: Self-pay

## 2021-06-10 ENCOUNTER — Ambulatory Visit: Payer: 59

## 2021-06-10 DIAGNOSIS — Z483 Aftercare following surgery for neoplasm: Secondary | ICD-10-CM | POA: Diagnosis not present

## 2021-06-10 DIAGNOSIS — M25612 Stiffness of left shoulder, not elsewhere classified: Secondary | ICD-10-CM

## 2021-06-10 DIAGNOSIS — C50412 Malignant neoplasm of upper-outer quadrant of left female breast: Secondary | ICD-10-CM

## 2021-06-10 DIAGNOSIS — R600 Localized edema: Secondary | ICD-10-CM

## 2021-06-10 DIAGNOSIS — Z170 Estrogen receptor positive status [ER+]: Secondary | ICD-10-CM

## 2021-06-10 DIAGNOSIS — R293 Abnormal posture: Secondary | ICD-10-CM

## 2021-06-10 DIAGNOSIS — L599 Disorder of the skin and subcutaneous tissue related to radiation, unspecified: Secondary | ICD-10-CM

## 2021-06-10 NOTE — Therapy (Signed)
[place], [place], [address] Phone: [phone]   Fax:  [phone]  Physical Therapy Treatment  Patient Details  Name: Jeanne Evans MRN: [id number] Date of Birth: [birth year] Referring Provider (PT): Dr. Erroll Luna   Encounter Date: 06/10/2021   PT End of Session - 06/10/21 1704     Visit Number 37    Number of Visits 38    Date for PT Re-Evaluation 06/17/21    PT Start Time 1602    PT Stop Time 1655    PT Time Calculation (min) 53 min    Activity Tolerance Patient tolerated treatment well    Behavior During Therapy [facility] for tasks assessed/performed             Past Medical History:  Diagnosis Date   Breast cancer ([place])    Family history of ovarian cancer    Family history of prostate cancer    GERD (gastroesophageal reflux disease)    History of asthma    has albuterol, uses PRN   History of heart murmur in childhood    History of multiple allergies     Past Surgical History:  Procedure Laterality Date   BREAST LUMPECTOMY WITH RADIOACTIVE SEED AND SENTINEL LYMPH NODE BIOPSY Left 11/19/2020   Procedure: LEFT BREAST LUMPECTOMY X 2 WITH RADIOACTIVE SEED AND SENTINEL LYMPH NODE MAPPING;  Surgeon: Erroll Luna, MD;  Location: [place];  Service: General;  Laterality: Left;  PECTORAL BLOCK   PORTACATH PLACEMENT Right 06/18/2020   Procedure: INSERTION PORT-A-CATH WITH ULTRASOUND GUIDANCE;  Surgeon: Erroll Luna, MD;  Location: [place];  Service: General;  Laterality: Right;   RE-EXCISION OF BREAST LUMPECTOMY Left 12/09/2020   Procedure: RE-EXCISION OF LEFT BREAST LUMPECTOMY;  Surgeon: Erroll Luna, MD;  Location: [place];  Service: General;  Laterality: Left;    There were no vitals filed for this visit.   Subjective Assessment - 06/10/21 1608     Subjective Still having issues with the cough.  They put me back on steroids  for 7 day pack.  I see the lung Dr. on the 17th. I did some of the exercises from last time.   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 Today my arm is hurting because I slept on it earlier,  It feels tired and achy right now. The numbness is better too and I don't have it much unless I get cold. It feels better with the sleeve on it..    Pertinent History Patient was diagnosed on 05/12/2020 with left breast cancer. She underwent neoadjuvant chemotherapy 06/19/2020-09/23/2020 and had a left lumpectomy and sentinel node biopsy (6 negative nodes) on 11/19/2020.  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 Numbness in posterior arm is a little better.    Patient Stated Goals Decrease pain and improve shoulder ROM    Currently in Pain? Yes    Pain Score 8     Pain Location Arm    Pain Orientation Left    Pain Descriptors / Indicators Aching    Pain Onset In the past 7 days    Pain Frequency Intermittent    Multiple Pain Sites No                               [facility] Adult PT Treatment/Exercise - 06/10/21 0001  Exercises   Other Exercises  recumbent bike x 5 min Lev 1, REviewed all warm up stretches, performed all exercises after mini squats with 2 #wts x 10 reps each side,but pt will leave dead lifts out at home secondary to difficulty with good form.  Reviewed proper progession of repetitions/wts.                         PT Long Term Goals - 05/06/21 1223       PT LONG TERM GOAL #1   Title Patient will demonstrate she has regained full shoulder ROM and function post operatively compared to baselines.    Time 8    Period Weeks    Status Achieved      PT LONG TERM GOAL #2   Title Patient will increase left shoulder active flexion to >/= 165 degrees for increased ease reaching.    Time 8    Period Weeks    Status Achieved      PT LONG TERM GOAL #3   Title Patient will increase left shoulder active abduction  to >/= 165 degrees for increased ease reaching.    Period Weeks    Status Achieved      PT LONG TERM GOAL #4   Title Patient will improve her DASH score to be </= 20 for improved overall upper extremity function.    Baseline 70.45 post op, 29% (05/06/21)    Time 8    Period Weeks    Status On-going    Target Date 06/17/21      PT LONG TERM GOAL #5   Title Patient will report >/= 50% decrease in pain for increased tolerance of daily tasks.    Baseline 65% better    Time 8    Period Weeks    Status Achieved      PT LONG TERM GOAL #6   Title Pt will increase sit to stand in 30 seconds to 12 to indicate an improvement in functional strength    Baseline 7 on 03/03/2021, not assessed.  pt having a cough attack    Time 6    Period Weeks    Target Date 06/17/21                   Plan - 06/10/21 1711     Clinical Impression Statement Today we warmed up and had pt review stretches from [facility] handout before performing the remaining exercises in the booklet after mini squats using 2# wts.  Pts arm was achy and already fatigued after sleeping on it wrong earlier today.  Pt required occasional VC's to engage abdominals with exercises and for proper form with exercises but was able to correct. We discussed proper way to progress repetitions and resistance. Discussed importance of pt exercising consistently to improve her arm strength for work activities. Discussed likely release from therapy next visit    Personal Factors and Comorbidities Comorbidity 3+    Comorbidities Left breast CA fuunctionally triple neg, chemo, radiation    Stability/Clinical Decision Making Stable/Uncomplicated    Rehab Potential Excellent    PT Frequency 1x / week    PT Duration 6 weeks    PT Treatment/Interventions ADLs/Self Care Home Management;Therapeutic exercise;Patient/family education;Passive range of motion;Manual lymph drainage;Manual techniques;Scar mobilization    PT Next Visit Plan Reassess for DC,  review ABC prn    PT Home Exercise Plan Post op shoulder ROM HEP, desensitization techniques, compression bra (now radiation bra)  and sleeve, supine scap. series, standing TB, Left UE MLD    Consulted and Agree with Plan of Care Patient             Patient will benefit from skilled therapeutic intervention in order to improve the following deficits and impairments:  Postural dysfunction, Decreased range of motion, Impaired UE functional use, Pain, Decreased knowledge of precautions, Decreased scar mobility, Increased fascial restricitons, Increased edema, Impaired flexibility  Visit Diagnosis: Aftercare following surgery for neoplasm  Stiffness of left shoulder, not elsewhere classified  Abnormal posture  Disorder of the skin and subcutaneous tissue related to radiation, unspecified  Malignant neoplasm of upper-outer quadrant of left breast in female, estrogen receptor positive ([place])  Localized edema     Problem List Patient Active Problem List   Diagnosis Date Noted   Breast cancer ([place]) 05/05/2021   Lymphedema of left arm 02/02/2021   Drug-induced neutropenia ([place]) 09/30/2020   Genetic testing 06/26/2020   Family history of ovarian cancer    Family history of prostate cancer    Malignant neoplasm of upper-outer quadrant of left breast in female, estrogen receptor negative ([place]) 06/03/2020    Claris Pong 06/10/2021, 5:16 PM  [place], [place], [address] Phone: [phone]   Fax:  [phone]  Name: Jeanne Evans MRN: [id number] Date of Birth: 1986-05-05 Cheral Almas, PT 06/10/21 5:18 PM

## 2021-06-11 ENCOUNTER — Encounter: Payer: Self-pay | Admitting: *Deleted

## 2021-06-17 ENCOUNTER — Ambulatory Visit: Payer: 59 | Attending: Surgery

## 2021-06-17 ENCOUNTER — Other Ambulatory Visit: Payer: Self-pay

## 2021-06-17 DIAGNOSIS — M25612 Stiffness of left shoulder, not elsewhere classified: Secondary | ICD-10-CM | POA: Insufficient documentation

## 2021-06-17 DIAGNOSIS — Z483 Aftercare following surgery for neoplasm: Secondary | ICD-10-CM | POA: Diagnosis not present

## 2021-06-17 DIAGNOSIS — R293 Abnormal posture: Secondary | ICD-10-CM | POA: Diagnosis present

## 2021-06-17 DIAGNOSIS — C50412 Malignant neoplasm of upper-outer quadrant of left female breast: Secondary | ICD-10-CM | POA: Insufficient documentation

## 2021-06-17 DIAGNOSIS — R600 Localized edema: Secondary | ICD-10-CM

## 2021-06-17 DIAGNOSIS — Z170 Estrogen receptor positive status [ER+]: Secondary | ICD-10-CM | POA: Insufficient documentation

## 2021-06-17 DIAGNOSIS — L599 Disorder of the skin and subcutaneous tissue related to radiation, unspecified: Secondary | ICD-10-CM

## 2021-06-17 NOTE — Therapy (Signed)
[place], [place], [address] Phone: [phone]   Fax:  [phone]  Physical Therapy Treatment  Patient Details  Name: Jeanne Evans MRN: [id number] Date of Birth: Jul 05, 1986 Referring Provider (PT): Dr. Erroll Luna   Encounter Date: 06/17/2021   PT End of Session - 06/17/21 1200     Visit Number 38    Number of Visits 38    Date for PT Re-Evaluation 06/17/21    PT Start Time 1102    PT Stop Time 1200    PT Time Calculation (min) 58 min    Activity Tolerance Patient tolerated treatment well    Behavior During Therapy WFL for tasks assessed/performed             Past Medical History:  Diagnosis Date   Breast cancer ([facility])    Family history of ovarian cancer    Family history of prostate cancer    GERD (gastroesophageal reflux disease)    History of asthma    has albuterol, uses PRN   History of heart murmur in childhood    History of multiple allergies     Past Surgical History:  Procedure Laterality Date   BREAST LUMPECTOMY WITH RADIOACTIVE SEED AND SENTINEL LYMPH NODE BIOPSY Left 11/19/2020   Procedure: LEFT BREAST LUMPECTOMY X 2 WITH RADIOACTIVE SEED AND SENTINEL LYMPH NODE MAPPING;  Surgeon: Erroll Luna, MD;  Location: [facility];  Service: General;  Laterality: Left;  PECTORAL BLOCK   PORTACATH PLACEMENT Right 06/18/2020   Procedure: INSERTION PORT-A-CATH WITH ULTRASOUND GUIDANCE;  Surgeon: Erroll Luna, MD;  Location: [facility];  Service: General;  Laterality: Right;   RE-EXCISION OF BREAST LUMPECTOMY Left 12/09/2020   Procedure: RE-EXCISION OF LEFT BREAST LUMPECTOMY;  Surgeon: Erroll Luna, MD;  Location: [facility];  Service: General;  Laterality: Left;    There were no vitals filed for this visit.   Subjective Assessment - 06/17/21 1102     Subjective Started 6 hours this week at work. Its been going OK. The numbness  in my arm is still better but it has come back a little bit.I have been doing the MLD. I have done some of the stretches from the ABC class, but I havent done any of the strength exercises. Other than that I have been feeling pretty good.    Pertinent History Patient was diagnosed on 05/12/2020 with left breast cancer. She underwent neoadjuvant chemotherapy 06/19/2020-09/23/2020 and had a left lumpectomy and sentinel node biopsy (6 negative nodes) on 11/19/2020.  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 Numbness in posterior arm is a little better.    Patient Stated Goals Decrease pain and improve shoulder ROM    Currently in Pain? No/denies   just tight under arm   Pain Score 0-No pain                [facility] PT Assessment - 06/17/21 0001       Assessment   Medical Diagnosis s/p left lumpectomy and sentinel node biopsy    Referring Provider (PT) Dr. [name] [place]    Onset Date/Surgical Date 11/19/20      AROM   Left Shoulder Flexion 166 Degrees    Left Shoulder ABduction 170 Degrees    Left Shoulder External Rotation 95 Degrees               LYMPHEDEMA/ONCOLOGY QUESTIONNAIRE - 06/17/21 0001       Right  Upper Extremity Lymphedema   15 cm Proximal to Olecranon Process 36.8 cm    10 cm Proximal to Olecranon Process 36.1 cm    Olecranon Process 28.8 cm    15 cm Proximal to Ulnar Styloid Process 25.4 cm    10 cm Proximal to Ulnar Styloid Process 21.7 cm    Just Proximal to Ulnar Styloid Process 17.1 cm    At Base of Thumb 6.4 cm      Left Upper Extremity Lymphedema   15 cm Proximal to Olecranon Process 35.4 cm    10 cm Proximal to Olecranon Process 35.6 cm    Olecranon Process 27.2 cm    15 cm Proximal to Ulnar Styloid Process 24.9 cm    10 cm Proximal to Ulnar Styloid Process 21.1 cm    Just Proximal to Ulnar Styloid Process 16.85 cm    At Base of 2nd Digit 6.7 cm             L-DEX FLOWSHEETS - 06/17/21 1200       L-DEX LYMPHEDEMA SCREENING    Measurement Type Unilateral    L-DEX MEASUREMENT EXTREMITY Upper Extremity    POSITION  Standing    DOMINANT SIDE Right    At Risk Side Left    BASELINE SCORE (UNILATERAL) -1.2    L-DEX SCORE (UNILATERAL) 4.7    VALUE CHANGE (UNILAT) 5.9               Quick Dash - 06/17/21 0001     Open a tight or new jar Mild difficulty    Do heavy household chores (wash walls, wash floors) Mild difficulty    Carry a shopping bag or briefcase Mild difficulty    Wash your back No difficulty    Use a knife to cut food No difficulty    Recreational activities in which you take some force or impact through your arm, shoulder, or hand (golf, hammering, tennis) Mild difficulty    During the past week, to what extent has your arm, shoulder or hand problem interfered with your normal social activities with family, friends, neighbors, or groups? Slightly    During the past week, to what extent has your arm, shoulder or hand problem limited your work or other regular daily activities Not at all    Arm, shoulder, or hand pain. Mild    Tingling (pins and needles) in your arm, shoulder, or hand Mild    Difficulty Sleeping No difficulty    DASH Score 15.91 %                    [facility] Adult PT Treatment/Exercise - 06/17/21 0001       Shoulder Exercises: Supine   Other Supine Exercises Supine wand flex and scaption x 5      Shoulder Exercises: Standing   Other Standing Exercises wall pec stretch x 3, wall slides x 3                         PT Long Term Goals - 06/17/21 1137       PT LONG TERM GOAL #1   Title Patient will demonstrate she has regained full shoulder ROM and function post operatively compared to baselines.    Period Weeks    Status Achieved      PT LONG TERM GOAL #2   Title Patient will increase left shoulder active flexion to >/= 165 degrees for increased ease reaching.  Time 8    Period Weeks    Status Achieved      PT LONG TERM GOAL #3   Title  Patient will increase left shoulder active abduction to >/= 165 degrees for increased ease reaching.    Time 8    Period Weeks    Status Achieved      PT LONG TERM GOAL #4   Title Patient will improve her DASH score to be </= 20 for improved overall upper extremity function.    Baseline 15.91    Time 8    Period Weeks    Status Achieved      PT LONG TERM GOAL #5   Title Patient will report >/= 50% decrease in pain for increased tolerance of daily tasks.    Baseline 65% better    Period Weeks    Status Achieved      PT LONG TERM GOAL #6   Title Pt will increase sit to stand in 30 seconds to 12 to indicate an improvement in functional strength    Baseline 8    Period Weeks    Status Not Met                   Plan - 06/17/21 1201     Clinical Impression Statement Pt was reassessed for Discharge.  She has achieved all goals establish except for sit to stand goal in 30 secs.  She continues with excellent shoulder ROM, and there is no measureable difference in circumference indicative of lymphedema.  We did repeat her SOZO screen and it has increased slightly but is not yet in the danger zone. She was given a script to get a flat knit sleeve for better fit and comfort. She still gets intermittent numbness in the posterior arm and intermittent pectoral tightness but she has been shown exercises to address this.  Her quick dash is improved to 15.9%    Personal Factors and Comorbidities Comorbidity 3+    Comorbidities Left breast CA fuunctionally triple neg, chemo, radiation    Stability/Clinical Decision Making Stable/Uncomplicated    Rehab Potential Excellent    PT Frequency 1x / week    PT Treatment/Interventions ADLs/Self Care Home Management;Therapeutic exercise;Patient/family education;Passive range of motion;Manual lymph drainage;Manual techniques;Scar mobilization    PT Next Visit Plan DC to HEP    PT Home Exercise Plan Post op shoulder ROM HEP, desensitization techniques,  compression bra (now radiation bra) and sleeve, supine scap. series, standing TB, Left UE MLD    Consulted and Agree with Plan of Care Patient            PHYSICAL THERAPY DISCHARGE SUMMARY  Visits from Start of Care: 38  Current functional level related to goals / functional outcomes: Achieved all UE goals, did not achieve sit to stand goal   Remaining deficits: Intermittent numbness left posterior arm, intermittent axillary/pectoral tightness   Education / Equipment:compression sleeve,gauntlet  Patient agrees to discharge. Patient goals were partially met. Patient is being discharged due to meeting the stated rehab goals except sit to stand  Patient will benefit from skilled therapeutic intervention in order to improve the following deficits and impairments:  Postural dysfunction, Decreased range of motion, Impaired UE functional use, Pain, Decreased knowledge of precautions, Decreased scar mobility, Increased fascial restricitons, Increased edema, Impaired flexibility  Visit Diagnosis: Aftercare following surgery for neoplasm  Stiffness of left shoulder, not elsewhere classified  Abnormal posture  Disorder of the skin and subcutaneous tissue related to radiation, unspecified  Malignant neoplasm of upper-outer quadrant of left breast in female, estrogen receptor positive ([facility])  Localized edema     Problem List Patient Active Problem List   Diagnosis Date Noted   Breast cancer ([facility]) 05/05/2021   Lymphedema of left arm 02/02/2021   Drug-induced neutropenia ([facility]) 09/30/2020   Genetic testing 06/26/2020   Family history of ovarian cancer    Family history of prostate cancer    Malignant neoplasm of upper-outer quadrant of left breast in female, estrogen receptor negative ([facility]) 06/03/2020    Claris Pong 06/17/2021, 12:08 PM  [place], [place], [address] Phone: [phone]   Fax:   [phone]  Name: LARANDA BURKEMPER MRN: [id number] Date of Birth: 02/12/1986 Cheral Almas, PT 06/17/21 12:11 PM

## 2021-06-23 ENCOUNTER — Other Ambulatory Visit: Payer: Self-pay

## 2021-06-23 ENCOUNTER — Encounter: Payer: Self-pay | Admitting: *Deleted

## 2021-06-23 ENCOUNTER — Inpatient Hospital Stay (HOSPITAL_BASED_OUTPATIENT_CLINIC_OR_DEPARTMENT_OTHER): Payer: 59 | Admitting: Oncology

## 2021-06-23 ENCOUNTER — Inpatient Hospital Stay: Payer: 59 | Attending: Oncology

## 2021-06-23 VITALS — BP 121/67 | HR 87 | Temp 97.4°F | Resp 18 | Ht 63.0 in | Wt 183.6 lb

## 2021-06-23 DIAGNOSIS — C50919 Malignant neoplasm of unspecified site of unspecified female breast: Secondary | ICD-10-CM

## 2021-06-23 DIAGNOSIS — Z171 Estrogen receptor negative status [ER-]: Secondary | ICD-10-CM

## 2021-06-23 DIAGNOSIS — J45909 Unspecified asthma, uncomplicated: Secondary | ICD-10-CM | POA: Diagnosis not present

## 2021-06-23 DIAGNOSIS — Z923 Personal history of irradiation: Secondary | ICD-10-CM | POA: Diagnosis not present

## 2021-06-23 DIAGNOSIS — C50412 Malignant neoplasm of upper-outer quadrant of left female breast: Secondary | ICD-10-CM

## 2021-06-23 DIAGNOSIS — Z79899 Other long term (current) drug therapy: Secondary | ICD-10-CM | POA: Insufficient documentation

## 2021-06-23 DIAGNOSIS — R059 Cough, unspecified: Secondary | ICD-10-CM

## 2021-06-23 DIAGNOSIS — Z853 Personal history of malignant neoplasm of breast: Secondary | ICD-10-CM | POA: Diagnosis not present

## 2021-06-23 LAB — CBC WITH DIFFERENTIAL (CANCER CENTER ONLY)
Abs Immature Granulocytes: 0.01 10*3/uL (ref 0.00–0.07)
Basophils Absolute: 0 10*3/uL (ref 0.0–0.1)
Basophils Relative: 1 %
Eosinophils Absolute: 0.7 10*3/uL — ABNORMAL HIGH (ref 0.0–0.5)
Eosinophils Relative: 11 %
HCT: 38 % (ref 36.0–46.0)
Hemoglobin: 12.8 g/dL (ref 12.0–15.0)
Immature Granulocytes: 0 %
Lymphocytes Relative: 22 %
Lymphs Abs: 1.3 10*3/uL (ref 0.7–4.0)
MCH: 30 pg (ref 26.0–34.0)
MCHC: 33.7 g/dL (ref 30.0–36.0)
MCV: 89 fL (ref 80.0–100.0)
Monocytes Absolute: 0.5 10*3/uL (ref 0.1–1.0)
Monocytes Relative: 8 %
Neutro Abs: 3.6 10*3/uL (ref 1.7–7.7)
Neutrophils Relative %: 58 %
Platelet Count: 254 10*3/uL (ref 150–400)
RBC: 4.27 MIL/uL (ref 3.87–5.11)
RDW: 13.9 % (ref 11.5–15.5)
WBC Count: 6.1 10*3/uL (ref 4.0–10.5)
nRBC: 0 % (ref 0.0–0.2)

## 2021-06-23 LAB — CMP (CANCER CENTER ONLY)
ALT: 19 U/L (ref 0–44)
AST: 17 U/L (ref 15–41)
Albumin: 4 g/dL (ref 3.5–5.0)
Alkaline Phosphatase: 84 U/L (ref 38–126)
Anion gap: 8 (ref 5–15)
BUN: 8 mg/dL (ref 6–20)
CO2: 28 mmol/L (ref 22–32)
Calcium: 9.5 mg/dL (ref 8.9–10.3)
Chloride: 104 mmol/L (ref 98–111)
Creatinine: 1.04 mg/dL — ABNORMAL HIGH (ref 0.44–1.00)
GFR, Estimated: >60 mL/min (ref >60)
Glucose, Bld: 104 mg/dL — ABNORMAL HIGH (ref 70–99)
Potassium: 3.8 mmol/L (ref 3.5–5.1)
Sodium: 140 mmol/L (ref 135–145)
Total Bilirubin: 0.9 mg/dL (ref 0.3–1.2)
Total Protein: 7.5 g/dL (ref 6.5–8.1)

## 2021-06-23 NOTE — Progress Notes (Signed)
Jeanne Evans  [phone] Fax:([phone]) [phone]     ID: Jeanne Evans DOB: 1986-08-18  MR#: [id number]  CSN#:[id number]  Patient Care Team: [name], MD as PCP - General (Obstetrics and Gynecology) Rockwell Germany, RN as Oncology Nurse Navigator Mauro Kaufmann, RN as Oncology Nurse Navigator Erroll Luna, MD as Consulting Physician (General Surgery) Darroll Bredeson, Virgie Dad, MD as Consulting Physician (Oncology) Kyung Rudd, MD as Consulting Physician (Radiation Oncology) Servando Salina, MD as Consulting Physician (Obstetrics and Gynecology) Chauncey Cruel, MD OTHER MD:  CHIEF COMPLAINT: 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   Since her last visit, she underwent chest CT on 05/29/2021 to further evaluate her persistent cough. This showed: improvement to previously-demonstrated widespread ill-defined pulmonary nodularity; no significant change to mediastinal and hilar adenopathy; improvement/resolution to previously-demonstrated hepatic lesions, though these are not well seen; findings remain most compatible with sarcoidosis; no progression of findings suspicious for metastatic disease.  She received two doses of Pembrolizumab on 04/16/2021 and 05/09/2021.  This has been on hold since due to cough.   REVIEW OF SYSTEMS: Chevronne received a Medrol Dosepak from her primary doctor and this significantly improved her cough.  She stopped it and then the cough has resumed.  She tells me she is only able to work about 6 hours right now but she is hoping to improve so by the middle of September she hopes to be able to go back full-term.  A detailed review of systems today was otherwise stable  COVID 19 VACCINATION STATUS: Cement Evans x2, most recently 01/2021   HISTORY OF CURRENT ILLNESS: From the original intake  note:  Jeanne Evans herself palpated a painful left breast lump. She underwent bilateral diagnostic mammography with tomography and left breast ultrasonography at [facility] on 05/12/2020 showing: breast density category B; palpable 2.7 cm oval hypoechoic mass in left breast at 2-3 o'clock; 2.2 cm left axillary lymph node/grouping of nodes.  Accordingly on 05/29/2020 she proceeded to biopsy of the left breast area in question. The pathology from this procedure ([id number]) showed: invasive ductal carcinoma, grade 3. Prognostic indicators significant for: estrogen receptor, 10% positive with weak staining intensity and progesterone receptor, 0% negative. Proliferation marker Ki67 at 95%. HER2 equivocal by immunohistochemistry (2+), but negative by fluorescent in situ hybridization with a signals ratio 1.77 and number per cell 2.65.  Biopsy of the left axillary lymph node in question was negative and concordant  The patient's subsequent history is as detailed below.   PAST MEDICAL HISTORY: Past Medical History:  Diagnosis Date   Breast cancer ([place])    Family history of ovarian cancer    Family history of prostate cancer    GERD (gastroesophageal reflux disease)    History of asthma    has albuterol, uses PRN   History of heart murmur in childhood    History of multiple allergies   History of allergy related headaches   PAST SURGICAL HISTORY: Past Surgical History:  Procedure Laterality Date   BREAST LUMPECTOMY WITH RADIOACTIVE SEED AND SENTINEL LYMPH NODE BIOPSY Left 11/19/2020   Procedure: LEFT BREAST LUMPECTOMY X 2 WITH RADIOACTIVE SEED AND SENTINEL LYMPH NODE MAPPING;  Surgeon: Erroll Luna, MD;  Location: [place];  Service: General;  Laterality: Left;  PECTORAL BLOCK   PORTACATH PLACEMENT Right 06/18/2020   Procedure: INSERTION PORT-A-CATH WITH ULTRASOUND GUIDANCE;  Surgeon:  Erroll Luna, MD;  Location: [place];  Service: General;   Laterality: Right;   RE-EXCISION OF BREAST LUMPECTOMY Left 12/09/2020   Procedure: RE-EXCISION OF LEFT BREAST LUMPECTOMY;  Surgeon: Erroll Luna, MD;  Location: [place];  Service: General;  Laterality: Left;  She reports having a boil lanced from her perineal area   FAMILY HISTORY: Family History  Problem Relation Age of Onset   Lung cancer Maternal Uncle        dx late 20s   Prostate cancer Paternal Uncle        dx late 58s   Ovarian cancer Paternal Grandmother        dx 64s   Prostate cancer Paternal Grandfather        dx 76s   Prostate cancer Paternal Uncle        dx early 55s  The patient's father is 63 and her mother 73, as of 05/2020.  The patient has two brothers and one sister. She reports ovarian cancer in her paternal grandmother, prostate cancer in her paternal grandfather and a paternal uncle, and lung cancer in a maternal uncle.   GYNECOLOGIC HISTORY:  No LMP recorded (lmp unknown). (Menstrual status: IUD). Menarche: 35 years old Age at first live birth: 35 years old [place] P 2 LMP current, experiences spotting Contraceptive: Mirena IUD in place HRT n/a  Hysterectomy? no BSO? 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 She describes herself as single. She lives at home with her children, Jeanne Evans (age 34) and Jeanne Evans (age 51).  She is originally from [place] her home.    ADVANCED DIRECTIVES: Not in place. She intends to name her mother, Jeanne Evans, as her HCPOA.  Jeanne Evans lives in [place] and can be reached at [phone].  The patient was given the appropriate documents to complete and notarized at her discretion at the time of her visit 06/04/2020   HEALTH MAINTENANCE: Social History   Tobacco Use   Smoking status: Never   Smokeless tobacco: Never  Substance Use Topics   Alcohol use: Yes    Comment:  occas   Drug use: Yes    Types: Marijuana     Colonoscopy: n/a (age)  PAP: 04/2020  Bone density: n/a (age)   Allergies  Allergen Reactions   Sulfa Antibiotics Hives    Current Outpatient Medications  Medication Sig Dispense Refill   Acetaminophen (TYLENOL) 325 MG CAPS Take by mouth.     benzonatate (TESSALON) 100 MG capsule Take 1 capsule (100 mg total) by mouth 2 (two) times daily as needed for cough. (Patient not taking: Reported on 05/11/2021) 60 capsule 1   gabapentin (NEURONTIN) 300 MG capsule Take 1 capsule (300 mg total) by mouth at bedtime. (Patient not taking: Reported on 05/11/2021) 90 capsule 4   ibuprofen (ADVIL) 800 MG tablet Take 1 tablet (800 mg total) by mouth every 8 (eight) hours as needed. 30 tablet 0   omeprazole (PRILOSEC) 40 MG capsule Take 1 capsule (40 mg total) by mouth at bedtime. 30 capsule 3   promethazine-codeine (PHENERGAN WITH CODEINE) 6.25-10 MG/5ML syrup Take 5 mLs by mouth every 8 (eight) hours as needed for cough. 120 mL 0   valACYclovir (VALTREX) 500 MG tablet Take 500 mg by mouth 2 (two) times daily.     venlafaxine XR (EFFEXOR-XR) 75 MG 24 hr capsule Take 1 capsule (75 mg  total) by mouth daily with breakfast. 90 capsule 4   No current facility-administered medications for this visit.    OBJECTIVE: African-American woman who appears stated age 68:   06/23/21 1224  BP: 121/67  Pulse: 87  Resp: 18  Temp: (!) 97.4 F (36.3 C)  SpO2: 100%     Body mass index is 32.52 kg/m.   Wt Readings from Last 3 Encounters:  06/23/21 183 lb 9.6 oz (83.3 kg)  05/11/21 181 lb 1.6 oz (82.1 kg)  05/08/21 183 lb 4 oz (83.1 kg)  ECOG FS:1 - Symptomatic but completely ambulatory  Sclerae unicteric, EOMs intact Wearing a mask No cervical or supraclavicular adenopathy Lungs no rales or rhonchi Heart regular rate and rhythm Abd soft, nontender, positive bowel sounds MSK no focal spinal tenderness, no upper extremity lymphedema Neuro: nonfocal, well  oriented, appropriate affect Breasts: The right breast is benign.  The left breast is status postlumpectomy and radiation.  There is no evidence of local recurrence.  Both axillae are benign   LAB RESULTS:  CMP     Component Value Date/Time   NA 140 06/23/2021 1210   K 3.8 06/23/2021 1210   CL 104 06/23/2021 1210   CO2 28 06/23/2021 1210   GLUCOSE 104 (H) 06/23/2021 1210   BUN 8 06/23/2021 1210   CREATININE 1.04 (H) 06/23/2021 1210   CALCIUM 9.5 06/23/2021 1210   PROT 7.5 06/23/2021 1210   ALBUMIN 4.0 06/23/2021 1210   AST 17 06/23/2021 1210   ALT 19 06/23/2021 1210   ALKPHOS 84 06/23/2021 1210   BILITOT 0.9 06/23/2021 1210   GFRNONAA >60 06/23/2021 1210   GFRAA >60 08/05/2020 0955   GFRAA >60 06/04/2020 0830    No results found for: TOTALPROTELP, ALBUMINELP, A1GS, A2GS, BETS, BETA2SER, GAMS, MSPIKE, SPEI  Lab Results  Component Value Date   WBC 6.1 06/23/2021   NEUTROABS 3.6 06/23/2021   HGB 12.8 06/23/2021   HCT 38.0 06/23/2021   MCV 89.0 06/23/2021   PLT 254 06/23/2021    No results found for: LABCA2  No components found for: [id number]  No results for input(s): INR in the last 168 hours.  No results found for: LABCA2  No results found for: HGD924  No results found for: QAS341  No results found for: DQQ229  No results found for: CA2729  No components found for: HGQUANT  No results found for: CEA1 / No results found for: CEA1   No results found for: AFPTUMOR  No results found for: CHROMOGRNA  No results found for: KPAFRELGTCHN, LAMBDASER, KAPLAMBRATIO (kappa/lambda light chains)  No results found for: HGBA, HGBA2QUANT, HGBFQUANT, HGBSQUAN (Hemoglobinopathy evaluation)   No results found for: LDH  No results found for: IRON, TIBC, IRONPCTSAT (Iron and TIBC)  No results found for: FERRITIN  Urinalysis No results found for: COLORURINE, APPEARANCEUR, LABSPEC, PHURINE, GLUCOSEU, HGBUR, BILIRUBINUR, KETONESUR, PROTEINUR, UROBILINOGEN, NITRITE,  LEUKOCYTESUR   STUDIES: CT Chest W Contrast  Result Date: 06/01/2021 CLINICAL DATA:  Persistent cough for 8 months. Breast cancer staging. Chemotherapy and radiation therapy completed. EXAM: CT CHEST WITH CONTRAST TECHNIQUE: Multidetector CT imaging of the chest was performed during intravenous contrast administration. CONTRAST:  6m OMNIPAQUE IOHEXOL 350 MG/ML SOLN COMPARISON:  CT 06/13/2020 FINDINGS: Cardiovascular: There are no significant vascular findings. Right IJ Port-A-Cath extends to the superior cavoatrial junction. The heart size is normal. There is no pericardial effusion. Mediastinum/Nodes: Interval left breast surgery and left axillary node dissection. There are no enlarged axillary or internal mammary lymph nodes.  There are stable mildly enlarged mediastinal and hilar lymph nodes, including a right hilar node measuring 1.4 cm on image 52/2 and a subcarinal node measuring 1.1 cm on image 60/2. No progressive adenopathy identified.Small amount of residual thymic tissue in the anterior mediastinum. The thyroid gland, trachea and esophagus demonstrate no significant findings. Lungs/Pleura: No pleural effusion or pneumothorax. The previously demonstrated widespread upper lobe-predominant pulmonary nodularity has significantly improved, and no enlarging pulmonary nodules are identified. Unchanged well-circumscribed right infrahilar/perifissural nodule measuring 2.2 x 1.4 cm on image 67/7, probably a lymph node. Upper abdomen: The liver is currently image prior to opacification of the hepatic veins, limiting its evaluation. However, the previously demonstrated low-density nodules throughout the liver are no longer seen. The spleen appears unremarkable. No adrenal mass. Musculoskeletal/Chest wall: As above, interval postsurgical changes in the right breast with nonspecific skin thickening. No recurrent breast or axillary mass. No suspicious osseous findings. IMPRESSION: 1. Interval left breast surgery  and axillary node dissection. 2. The previously demonstrated widespread ill-defined pulmonary nodularity has improved. The mediastinal and hilar adenopathy has not significantly changed. The previously demonstrated hepatic lesions are not as well seen, but also appear improved/resolved. These findings remain most compatible with sarcoidosis; correlate clinically. 3. No progression of findings suspicious for metastatic disease. Electronically Signed   By: Richardean Sale M.D.   On: 06/01/2021 08:31     ELIGIBLE FOR AVAILABLE RESEARCH PROTOCOL: no  ASSESSMENT: 35 y.o. Jeanne Evans woman status post left breast upper outer quadrant biopsy 05/29/2020 for a clinical T2 N0, stage IIB invasive ductal carcinoma, functionally triple negative, with an MIB-1 of 95%.  (1) genetics testing 06/23/2020 through the [facility] Multi-Cancer Panel found no deleterious mutations in AIP, ALK, APC, ATM, AXIN2,BAP1,  BARD1, BLM, BMPR1A, BRCA1, BRCA2, BRIP1, CASR, CDC73, CDH1, CDK4, CDKN1B, CDKN1C, CDKN2A (p14ARF), CDKN2A (p16INK4a), CEBPA, CHEK2, CTNNA1, DICER1, DIS3L2, EGFR (c.2369C>T, p.Thr790Met variant only), EPCAM (Deletion/duplication testing only), FH, FLCN, GATA2, GPC3, GREM1 (Promoter region deletion/duplication testing only), HOXB13 (c.251G>A, p.Gly84Glu), HRAS, KIT, MAX, MEN1, MET, MITF (c.952G>A, p.Glu318Lys variant only), MLH1, MSH2, MSH3, MSH6, MUTYH, NBN, NF1, NF2, NTHL1, PALB2, PDGFRA, PHOX2B, PMS2, POLD1, POLE, POT1, PRKAR1A, PTCH1, PTEN, RAD50, RAD51C, RAD51D, RB1, RECQL4, RET, RNF43, RUNX1, SDHAF2, SDHA (sequence changes only), SDHB, SDHC, SDHD, SMAD4, SMARCA4, SMARCB1, SMARCE1, STK11, SUFU, TERC, TERT, TMEM127, TP53, TSC1, TSC2, VHL, WRN and WT1.   (2) neoadjuvant chemotherapy consisting of cyclophosphamide and doxorubicin in dose dense fashion x4 started 06/19/2020, completed 08/05/2020, followed by paclitaxel and carboplatin weekly x12 starting 08/26/2020, completing 4 doses 09/23/2020  (a) echocardiogram on  06/13/2020 showed an EF of 60-65%  (b) paclitaxel and carboplatin discontinued after 4 doses because of neuropathy and cytopenias  (3) status post left lumpectomy and sentinel lymph node sampling 11/19/2020 for a residual yp T1c ypN0 invasive ductal carcinoma involving the superior margin  (a) additional surgery 12/09/2020 cleared the margin in question  (b) repeat prognostic panel confirms triple negative disease  (4) adjuvant radiation : 01/22/2021 through 03/10/2021 Site Technique Total Dose (Gy) Dose per Fx (Gy) Completed Fx Beam Energies  Breast, Left: Breast_Lt 3D 50.4/50.4 1.8 28/28 6X  Breast, Left: Breast_Lt_Bst 3D 10/10 2 5/5 6X   (a) received capecitabine sensitization, started 01/26/2021  (5) pembrolizumab/Keytruda began on 04/16/2021, discontinued 05/11/2021 (after 2 doses) with persistent cough  (6) molecular pathology from the 11/19/2020 sample requested 06/24/2021   PLAN: Melaney is still struggling with her cough, which is troublesome both to her work and to her sleep.  We have tried numerous things  of which the only one that appears to work is steroids.  There is a concern whether she may have sarcoidosis which would require different type of treatment.  She is scheduled to see Dr. Chase Caller in pulmonary for further evaluation and treatment.  She hopes to be able to have recovered sufficiently by mid-September to work full-time.  I will be glad to write her a note regarding that as appropriate  At this point though we are discontinuing the pembrolizumab.  I am not going to be able to tell whether that is causing pneumonitis or otherwise causing her cough.  We are starting observation alone.  Total encounter time 35 minutes.Sarajane Jews C. Eliannah Hinde, MD 06/23/21 9:18 PM Medical Oncology and Hematology [facility] [place], [place] [address] Tel. [phone]    Fax. [phone]   I, Jeanne Evans, am acting as scribe for Dr. Virgie Dad.  Luisana Lutzke.  I, Lurline Del MD, have reviewed the above documentation for accuracy and completeness, and I agree with the above.    *Total Encounter Time as defined by the Centers for Medicare and Medicaid Services includes, in addition to the face-to-face time of a patient visit (documented in the note above) non-face-to-face time: obtaining and reviewing outside history, ordering and reviewing medications, tests or procedures, care coordination (communications with other health care professionals or caregivers) and documentation in the medical record.

## 2021-06-24 ENCOUNTER — Telehealth: Payer: Self-pay

## 2021-06-24 ENCOUNTER — Encounter: Payer: Self-pay | Admitting: *Deleted

## 2021-06-24 NOTE — Telephone Encounter (Signed)
Patient notified, verbalized understanding and agreement to increase fluid intake.

## 2021-06-24 NOTE — Telephone Encounter (Signed)
-----   Message from Gardenia Phlegm, NP sent at 06/23/2021  2:59 PM EDT ----- Kidney function slightly increased, please make sure she is drinking enough water.  ----- Message ----- From: Buel Ream, Lab In [place] Sent: 06/23/2021  12:18 PM EDT To: Gardenia Phlegm, NP

## 2021-06-25 ENCOUNTER — Telehealth: Payer: Self-pay | Admitting: Oncology

## 2021-06-25 NOTE — Telephone Encounter (Signed)
Scheduled appts per 8/9 los. Pt aware.

## 2021-07-01 ENCOUNTER — Other Ambulatory Visit: Payer: Self-pay

## 2021-07-01 ENCOUNTER — Encounter: Payer: Self-pay | Admitting: Internal Medicine

## 2021-07-01 ENCOUNTER — Ambulatory Visit (INDEPENDENT_AMBULATORY_CARE_PROVIDER_SITE_OTHER): Payer: 59 | Admitting: Internal Medicine

## 2021-07-01 VITALS — BP 128/80 | HR 83 | Temp 98.3°F | Ht 62.0 in | Wt 181.8 lb

## 2021-07-01 DIAGNOSIS — J45991 Cough variant asthma: Secondary | ICD-10-CM | POA: Diagnosis not present

## 2021-07-01 DIAGNOSIS — R053 Chronic cough: Secondary | ICD-10-CM

## 2021-07-01 DIAGNOSIS — R590 Localized enlarged lymph nodes: Secondary | ICD-10-CM | POA: Diagnosis not present

## 2021-07-01 DIAGNOSIS — Z853 Personal history of malignant neoplasm of breast: Secondary | ICD-10-CM

## 2021-07-01 DIAGNOSIS — R911 Solitary pulmonary nodule: Secondary | ICD-10-CM

## 2021-07-01 LAB — NITRIC OXIDE: Nitric Oxide: 94

## 2021-07-01 MED ORDER — FLUTICASONE FUROATE-VILANTEROL 100-25 MCG/INH IN AEPB: 1.0000 | INHALATION_SPRAY | Freq: Every day | RESPIRATORY_TRACT | 5 refills | Status: DC

## 2021-07-01 MED ORDER — ALBUTEROL SULFATE HFA 108 (90 BASE) MCG/ACT IN AERS: 2.0000 | INHALATION_SPRAY | Freq: Four times a day (QID) | RESPIRATORY_TRACT | 6 refills | Status: DC | PRN

## 2021-07-01 NOTE — Patient Instructions (Addendum)
ICD-10-CM   1. Chronic cough  R05.3     2. History of breast cancer  Z85.3     3. Mediastinal adenopathy  R59.0     4. Left upper lobe pulmonary nodule  R91.1      I do not think cough is chemo related or cough neuropathy Cough sounds like is from asthma with sarcoid In the differential diagnosis FeNO test is 94ppb and disagnosis is very suggestiive of asthma  Plan - start BREO 100 strength 1 puff daily with albuterol as needed  -check Blood Cbc with diff, IgE and RAST allergy profile  - check Quantiferon gold, ANA, DS DNA, RF, CCP, ssa/ssb, scl-70, ESR and ACE levels - do full PFT - do feno test 07/01/2021 or next visit (today if possible) - ok to start keytrude if workup above is satisfactory  Followup  - 2-3 weeks with APP or Dr Chase Caller but after completing above  - consider oral steroids and othe Rx based on above results and respisne  - cough score at followup

## 2021-07-01 NOTE — Progress Notes (Signed)
35 y.o. [place] woman status post left breast upper outer quadrant biopsy 05/29/2020 for a clinical T2 N0, stage IIB invasive ductal carcinoma, functionally triple negative, with an MIB-1 of 95%.   (1) genetics testing 06/23/2020 through the [facility] Multi-Cancer Panel found no deleterious mutations in AIP, ALK, APC, ATM, AXIN2,BAP1,  BARD1, BLM, BMPR1A, BRCA1, BRCA2, BRIP1, CASR, CDC73, CDH1, CDK4, CDKN1B, CDKN1C, CDKN2A (p14ARF), CDKN2A (p16INK4a), CEBPA, CHEK2, CTNNA1, DICER1, DIS3L2, EGFR (c.2369C>T, p.Thr790Met variant only), EPCAM (Deletion/duplication testing only), FH, FLCN, GATA2, GPC3, GREM1 (Promoter region deletion/duplication testing only), HOXB13 (c.251G>A, p.Gly84Glu), HRAS, KIT, MAX, MEN1, MET, MITF (c.952G>A, p.Glu318Lys variant only), MLH1, MSH2, MSH3, MSH6, MUTYH, NBN, NF1, NF2, NTHL1, PALB2, PDGFRA, PHOX2B, PMS2, POLD1, POLE, POT1, PRKAR1A, PTCH1, PTEN, RAD50, RAD51C, RAD51D, RB1, RECQL4, RET, RNF43, RUNX1, SDHAF2, SDHA (sequence changes only), SDHB, SDHC, SDHD, SMAD4, SMARCA4, SMARCB1, SMARCE1, STK11, SUFU, TERC, TERT, TMEM127, TP53, TSC1, TSC2, VHL, WRN and WT1.    (2) neoadjuvant chemotherapy consisting of cyclophosphamide and doxorubicin in dose dense fashion x4 started 06/19/2020, completed 08/05/2020, followed by paclitaxel and carboplatin weekly x12 starting 08/26/2020, completing 4 doses 09/23/2020             (a) echocardiogram on 06/13/2020 showed an EF of 60-65%             (b) paclitaxel and carboplatin discontinued after 4 doses because of neuropathy and cytopenias   (3) status post left lumpectomy and sentinel lymph node sampling 11/19/2020 for a residual yp T1c ypN0 invasive ductal carcinoma involving the superior margin             (a) additional surgery 12/09/2020 cleared the margin in question             (b) repeat prognostic panel confirms triple negative disease   (4) adjuvant radiation : 01/22/2021 through 03/10/2021 Site Technique Total Dose (Gy) Dose per  Fx (Gy) Completed Fx Beam Energies  Breast, Left: Breast_Lt 3D 50.4/50.4 1.8 28/28 6X  Breast, Left: Breast_Lt_Bst 3D 10/10 2 5/5 6X              (a) received capecitabine sensitization, started 01/26/2021   (5) pembrolizumab/Keytruda began on 04/16/2021, discontinued 05/11/2021 (after 2 doses) with persistent cough   (6) molecular pathology from the 11/19/2020 sample requested 06/24/2021    OV 07/01/2021 - NEW CONSULT  from Dr Jana Hakim  Subjective:  Patient ID: Jeanne Evans, female , DOB: November 23, 1985 , age 35 y.o. , MRN: [id number] , ADDRESS: [place] [id number] PCP [name], MD Patient Care Team: [name], MD as PCP - General (Obstetrics and Gynecology) Rockwell Germany, RN as Oncology Nurse Navigator Mauro Kaufmann, RN as Oncology Nurse Navigator Erroll Luna, MD as Consulting Physician (General Surgery) Magrinat, Virgie Dad, MD as Consulting Physician (Oncology) Kyung Rudd, MD as Consulting Physician (Radiation Oncology) Servando Salina, MD as Consulting Physician (Obstetrics and Gynecology)  This Provider for this visit: Treatment Team:  Attending Provider: Brand Males, MD    07/01/2021 -   Chief Complaint  Patient presents with   Consult    Pt states concerns of a cough from November last year came after chemo.      HPI Jeanne Evans 35 y.o. - African Ameriican female. Referred by Dr  Jana Hakim for chronic cough. Used to live In [place], [place] and relocated to [place] 6 years ago. Mom has asthma NOS.   She is non-smoker. Denies drug use.  Reports insidious onset of cough since Nov-Dec 2021 but  this was after dx of breast cancer and start of chemo. Denies ace inhibitor. Denies herceptin use. PReceded Keytruda. She got 2 doses of keytruda recently (as above) but [place] did not make her cough worse or better. Is currently on hold. Reports cough as severe and associatd with clear mucus. Loses sleep. Worse at night. Associated with  gaggin and chest hurt. Does lose sleep. Is on PPI/H2 blockae and cough some better iwh this. HAs had 2 rounds of oral steroids in summer 2022 but this helped. No steroids x 3 weeks. Cough associated with wheezing.   No personal hx of sarcoid, asthma,  Did have frequent bronchitis > 6 years ago when living in [place] Did have heart burn with XRT Did have seasonal allergies in [place] but not in [place]   She had CT chest July 2022 - personally visualized. Sarcoid is beig raised but only thing I see is a reudced LUL nodular density compared to a year ago. Mild mediastinal adenopathy + and stable.   FeNO done today is elvated 0 94ppb     CT Chest data 05/29/21  CLINICAL DATA:  Persistent cough for 8 months. Breast cancer staging. Chemotherapy and radiation therapy completed.   EXAM: CT CHEST WITH CONTRAST   TECHNIQUE: Multidetector CT imaging of the chest was performed during intravenous contrast administration.   CONTRAST:  4m OMNIPAQUE IOHEXOL 350 MG/ML SOLN   COMPARISON:  CT 06/13/2020   FINDINGS: Cardiovascular: There are no significant vascular findings. Right IJ Port-A-Cath extends to the superior cavoatrial junction. The heart size is normal. There is no pericardial effusion.   Mediastinum/Nodes: Interval left breast surgery and left axillary node dissection. There are no enlarged axillary or internal mammary lymph nodes. There are stable mildly enlarged mediastinal and hilar lymph nodes, including a right hilar node measuring 1.4 cm on image 52/2 and a subcarinal node measuring 1.1 cm on image 60/2. No progressive adenopathy identified.Small amount of residual thymic tissue in the anterior mediastinum. The thyroid gland, trachea and esophagus demonstrate no significant findings.   Lungs/Pleura: No pleural effusion or pneumothorax. The previously demonstrated widespread upper lobe-predominant pulmonary nodularity has significantly improved, and no enlarging  pulmonary nodules are identified. Unchanged well-circumscribed right infrahilar/perifissural nodule measuring 2.2 x 1.4 cm on image 67/7, probably a lymph node.   Upper abdomen: The liver is currently image prior to opacification of the hepatic veins, limiting its evaluation. However, the previously demonstrated low-density nodules throughout the liver are no longer seen. The spleen appears unremarkable. No adrenal mass.   Musculoskeletal/Chest wall: As above, interval postsurgical changes in the right breast with nonspecific skin thickening. No recurrent breast or axillary mass. No suspicious osseous findings.   IMPRESSION: 1. Interval left breast surgery and axillary node dissection. 2. The previously demonstrated widespread ill-defined pulmonary nodularity has improved. The mediastinal and hilar adenopathy has not significantly changed. The previously demonstrated hepatic lesions are not as well seen, but also appear improved/resolved. These findings remain most compatible with sarcoidosis; correlate clinically. 3. No progression of findings suspicious for metastatic disease.     Electronically Signed   By: WRichardean SaleM.D.   On: 06/01/2021 08:31    No results found.    PFT  No flowsheet data found.     has a past medical history of Breast cancer ([place], Family history of ovarian cancer, Family history of prostate cancer, GERD (gastroesophageal reflux disease), History of asthma, History of heart murmur in childhood, and History of multiple allergies.   reports that  she has never smoked. She has never used smokeless tobacco.  Past Surgical History:  Procedure Laterality Date   BREAST LUMPECTOMY WITH RADIOACTIVE SEED AND SENTINEL LYMPH NODE BIOPSY Left 11/19/2020   Procedure: LEFT BREAST LUMPECTOMY X 2 WITH RADIOACTIVE SEED AND SENTINEL LYMPH NODE MAPPING;  Surgeon: Erroll Luna, MD;  Location: [facility];  Service: General;  Laterality: Left;   PECTORAL BLOCK   PORTACATH PLACEMENT Right 06/18/2020   Procedure: INSERTION PORT-A-CATH WITH ULTRASOUND GUIDANCE;  Surgeon: Erroll Luna, MD;  Location: [facility];  Service: General;  Laterality: Right;   RE-EXCISION OF BREAST LUMPECTOMY Left 12/09/2020   Procedure: RE-EXCISION OF LEFT BREAST LUMPECTOMY;  Surgeon: Erroll Luna, MD;  Location: [facility];  Service: General;  Laterality: Left;    Allergies  Allergen Reactions   Sulfa Antibiotics Hives    Immunization History  Administered Date(s) Administered   PFIZER Comirnaty([place])Covid-19 Tri-Sucrose Vaccine 01/06/2021, 01/27/2021    Family History  Problem Relation Age of Onset   Lung cancer Maternal Uncle        dx late 40s   Prostate cancer Paternal Uncle        dx late 78s   Ovarian cancer Paternal Grandmother        dx 107s   Prostate cancer Paternal Grandfather        dx 42s   Prostate cancer Paternal Uncle        dx early 32s     Current Outpatient Medications:    Acetaminophen (TYLENOL) 325 MG CAPS, Take by mouth., Disp: , Rfl:    albuterol (VENTOLIN HFA) 108 (90 Base) MCG/ACT inhaler, Inhale 2 puffs into the lungs every 6 (six) hours as needed for wheezing or shortness of breath., Disp: 8 g, Rfl: 6   fluticasone furoate-vilanterol (BREO ELLIPTA) 100-25 MCG/INH AEPB, Inhale 1 puff into the lungs daily., Disp: 60 each, Rfl: 5   gabapentin (NEURONTIN) 300 MG capsule, Take 1 capsule (300 mg total) by mouth at bedtime., Disp: 90 capsule, Rfl: 4   ibuprofen (ADVIL) 800 MG tablet, Take 1 tablet (800 mg total) by mouth every 8 (eight) hours as needed., Disp: 30 tablet, Rfl: 0   omeprazole (PRILOSEC) 40 MG capsule, Take 1 capsule (40 mg total) by mouth at bedtime., Disp: 30 capsule, Rfl: 3   promethazine-codeine (PHENERGAN WITH CODEINE) 6.25-10 MG/5ML syrup, Take 5 mLs by mouth every 8 (eight) hours as needed for cough., Disp: 120 mL, Rfl: 0   valACYclovir (VALTREX) 500 MG tablet, Take  500 mg by mouth 2 (two) times daily., Disp: , Rfl:    venlafaxine XR (EFFEXOR-XR) 75 MG 24 hr capsule, Take 1 capsule (75 mg total) by mouth daily with breakfast., Disp: 90 capsule, Rfl: 4      Objective:   Vitals:   07/01/21 1531  BP: 128/80  Pulse: 83  Temp: 98.3 F (36.8 C)  TempSrc: Oral  SpO2: 100%  Weight: 181 lb 12.8 oz (82.5 kg)  Height: '5\' 2"'  (1.575 m)    Estimated body mass index is 33.25 kg/m as calculated from the following:   Height as of this encounter: '5\' 2"'  (1.575 m).   Weight as of this encounter: 181 lb 12.8 oz (82.5 kg).  '@WEIGHTCHANGE' @  [employer]   07/01/21 1531  Weight: 181 lb 12.8 oz (82.5 kg)     Physical Exam  General Appearance:    Alert, cooperative, no distress, appears stated age - yes , Deconditioned looking - no ,  OBESE  - no, Sitting on Wheelchair -  no  Head:    Normocephalic, without obvious abnormality, atraumatic  Eyes:    PERRL, conjunctiva/corneas clear,  Ears:    Normal TM's and external ear canals, both ears  Nose:   Nares normal, septum midline, mucosa normal, no drainage    or sinus tenderness. OXYGEN ON  - no . Patient is @ ra   Throat:   Lips, mucosa, and tongue normal; teeth and gums normal. Cyanosis on lips - no  Neck:   Supple, symmetrical, trachea midline, no adenopathy;    thyroid:  no enlargement/tenderness/nodules; no carotid   bruit or JVD  Back:     Symmetric, no curvature, ROM normal, no CVA tenderness  Lungs:     Distress - no , Wheeze no, Barrell Chest - no, Purse lip breathing - no, Crackles - no   Chest Wall:    No tenderness or deformity.    Heart:    Regular rate and rhythm, S1 and S2 normal, no rub   or gallop, Murmur - no  Breast Exam:    NOT DONE  Abdomen:     Soft, non-tender, bowel sounds active all four quadrants,    no masses, no organomegaly. Visceral obesity - yes  Genitalia:   NOT DONE  Rectal:   NOT DONE  Extremities:   Extremities - normal, Has Cane - no, Clubbing - no, Edema - no  Pulses:    2+ and symmetric all extremities  Skin:   Stigmata of Connective Tissue Disease - no  Lymph nodes:   Cervical, supraclavicular, and axillary nodes normal  Psychiatric:  Neurologic:   Pleasant - yes, Anxious - no, Flat affect - no  CAm-ICU - neg, Alert and Oriented x 3 - yes, Moves all 4s - yes, Speech - normal, Cognition - intact         Assessment:       ICD-10-CM   1. Chronic cough  R05.3 Nitric oxide    CBC with Differential/Platelet    Resp Allergy Profile Regn2DC DE MD  VA    IgE    Cyclic citrul peptide antibody, IgG    Anti-DNA antibody, double-stranded    Rheumatoid factor    Anti-scleroderma antibody    Sedimentation rate    ANA    Sjogren's syndrome antibods(ssa + ssb)    QuantiFERON-TB Gold Plus    Angiotensin converting enzyme    Pulmonary function test    2. Cough variant asthma  J45.991     3. History of breast cancer  Z85.3     4. Mediastinal adenopathy  R59.0     5. Left upper lobe pulmonary nodule  R91.1          Plan:     Patient Instructions     ICD-10-CM   1. Chronic cough  R05.3     2. History of breast cancer  Z85.3     3. Mediastinal adenopathy  R59.0     4. Left upper lobe pulmonary nodule  R91.1      I do not think cough is chemo related or cough neuropathy Cough sounds like is from asthma with sarcoid In the differential diagnosis FeNO test is 94ppb and disagnosis is very suggestiive of asthma  Plan - start BREO 100 strength 1 puff daily with albuterol as needed  -check Blood Cbc with diff, IgE and RAST allergy profile  - check Quantiferon gold, ANA, DS DNA, RF, CCP, ssa/ssb, scl-70, ESR  and ACE levels - do full PFT - do feno test 07/01/2021 or next visit (today if possible) - ok to start keytrude if workup above is satisfactory  Followup  - 2-3 weeks with APP or Dr Chase Caller but after completing above  - consider oral steroids and othe Rx based on above results and respisne  - cough score at followup    SIGNATURE     Dr. Brand Males, M.D., F.C.C.P,  Pulmonary and Critical Care Medicine Staff Physician, [place] Director - Interstitial Lung Disease  Program  Pulmonary [place] at [place], [place], [address]  Pager: [phone], If no answer or between  15:00h - 7:00h: call [phone] Telephone: [phone]  5:27 PM 07/01/2021

## 2021-07-06 ENCOUNTER — Other Ambulatory Visit: Payer: Self-pay | Admitting: Oncology

## 2021-07-07 ENCOUNTER — Encounter: Payer: Self-pay | Admitting: Cardiology

## 2021-07-07 ENCOUNTER — Ambulatory Visit (INDEPENDENT_AMBULATORY_CARE_PROVIDER_SITE_OTHER): Payer: 59 | Admitting: Cardiology

## 2021-07-07 ENCOUNTER — Other Ambulatory Visit (INDEPENDENT_AMBULATORY_CARE_PROVIDER_SITE_OTHER): Payer: 59

## 2021-07-07 ENCOUNTER — Other Ambulatory Visit: Payer: Self-pay

## 2021-07-07 VITALS — BP 129/85 | HR 68 | Ht 62.5 in | Wt 187.2 lb

## 2021-07-07 DIAGNOSIS — R0602 Shortness of breath: Secondary | ICD-10-CM | POA: Diagnosis not present

## 2021-07-07 DIAGNOSIS — R600 Localized edema: Secondary | ICD-10-CM

## 2021-07-07 DIAGNOSIS — R053 Chronic cough: Secondary | ICD-10-CM

## 2021-07-07 LAB — CBC WITH DIFFERENTIAL/PLATELET
Basophils Absolute: 0 10*3/uL (ref 0.0–0.1)
Basophils Relative: 0.8 % (ref 0.0–3.0)
Eosinophils Absolute: 0.3 10*3/uL (ref 0.0–0.7)
Eosinophils Relative: 10.7 % — ABNORMAL HIGH (ref 0.0–5.0)
HCT: 38.1 % (ref 36.0–46.0)
Hemoglobin: 12.6 g/dL (ref 12.0–15.0)
Lymphocytes Relative: 34.3 % (ref 12.0–46.0)
Lymphs Abs: 1.1 10*3/uL (ref 0.7–4.0)
MCHC: 33 g/dL (ref 30.0–36.0)
MCV: 89.2 fl (ref 78.0–100.0)
Monocytes Absolute: 0.2 10*3/uL (ref 0.1–1.0)
Monocytes Relative: 7.2 % (ref 3.0–12.0)
Neutro Abs: 1.5 10*3/uL (ref 1.4–7.7)
Neutrophils Relative %: 47 % (ref 43.0–77.0)
Platelets: 252 10*3/uL (ref 150.0–400.0)
RBC: 4.27 Mil/uL (ref 3.87–5.11)
RDW: 14 % (ref 11.5–15.5)
WBC: 3.2 10*3/uL — ABNORMAL LOW (ref 4.0–10.5)

## 2021-07-07 LAB — SEDIMENTATION RATE: Sed Rate: 37 mm/hr — ABNORMAL HIGH (ref 0–20)

## 2021-07-07 NOTE — Patient Instructions (Signed)
Medication Instructions:  Your physician recommends that you continue on your current medications as directed. Please refer to the Current Medication list given to you today.  Lab Work: BNP, TSH today  If you have labs (blood work) drawn today and your tests are completely normal, you will receive your results only by: [place] (if you have MyChart) OR A paper copy in the mail If you have any lab test that is abnormal or we need to change your treatment, we will call you to review the results.   Testing/Procedures: Your physician has requested that you have an echocardiogram. Echocardiography is a painless test that uses sound waves to create images of your heart. It provides your doctor with information about the size and shape of your heart and how well your heart's chambers and valves are working. This procedure takes approximately one hour. There are no restrictions for this procedure.  This will be done at our [facility] location:  [place]: At [facility], you and your health needs are our priority.  As part of our continuing mission to provide you with exceptional heart care, we have created designated Provider Care Teams.  These Care Teams include your primary Cardiologist (physician) and Advanced Practice Providers (APPs -  Physician Assistants and Nurse Practitioners) who all work together to provide you with the care you need, when you need it.  We recommend signing up for the patient portal called "MyChart".  Sign up information is provided on this After Visit Summary.  MyChart is used to connect with patients for Virtual Visits (Telemedicine).  Patients are able to view lab/test results, encounter notes, upcoming appointments, etc.  Non-urgent messages can be sent to your provider as well.   To learn more about what you can do with MyChart, go to [email or web address].    Your next appointment:   6 month(s)  The format for your  next appointment:   In Person  Provider:   Oswaldo Milian, MD

## 2021-07-07 NOTE — Progress Notes (Signed)
Cardiology Office Note:    Date:  07/08/2021   ID:  Jeanne Evans, DOB [birth year], MRN [id number]  PCP:  [name], MD  Cardiologist:  None  Electrophysiologist:  None   Referring MD: [name], MD   Chief Complaint  Patient presents with   Shortness of Breath    History of Present Illness:    Jeanne Evans is a 35 y.o. female with a hx of breast cancer who is referred by Dr. Alwyn Pea for evaluation of dyspnea.  She reports she has been having issues with coughing since she was started on chemotherapy for breast cancer.  She underwent surgery/radiation/chemo.  She saw pulmonology, thought to have asthma versus possible sarcoid.  She was started on inhalers, reports has been helping.  Reports she gets short of breath when she is having coughing spells but also notes that sometimes occurs with exertion.  She denies any chest pain except when having severe coughing spells.  Denies any lightheadedness or syncope.  Does report some ankle swelling.  Denies any palpitations.  Denies any cigarette use.  Smokes marijuana daily.  Works as a Sports coach for [employer].  No history of heart disease in her immediate family.   Past Medical History:  Diagnosis Date   Breast cancer ([place])    Family history of ovarian cancer    Family history of prostate cancer    GERD (gastroesophageal reflux disease)    History of asthma    has albuterol, uses PRN   History of heart murmur in childhood    History of multiple allergies     Past Surgical History:  Procedure Laterality Date   BREAST LUMPECTOMY WITH RADIOACTIVE SEED AND SENTINEL LYMPH NODE BIOPSY Left 11/19/2020   Procedure: LEFT BREAST LUMPECTOMY X 2 WITH RADIOACTIVE SEED AND SENTINEL LYMPH NODE MAPPING;  Surgeon: Erroll Luna, MD;  Location: [facility];  Service: General;  Laterality: Left;  PECTORAL BLOCK   PORTACATH PLACEMENT Right 06/18/2020   Procedure: INSERTION PORT-A-CATH WITH ULTRASOUND GUIDANCE;  Surgeon:  Erroll Luna, MD;  Location: [facility];  Service: General;  Laterality: Right;   RE-EXCISION OF BREAST LUMPECTOMY Left 12/09/2020   Procedure: RE-EXCISION OF LEFT BREAST LUMPECTOMY;  Surgeon: Erroll Luna, MD;  Location: [facility];  Service: General;  Laterality: Left;    Current Medications: Current Meds  Medication Sig   Acetaminophen (TYLENOL) 325 MG CAPS Take by mouth.   albuterol (VENTOLIN HFA) 108 (90 Base) MCG/ACT inhaler Inhale 2 puffs into the lungs every 6 (six) hours as needed for wheezing or shortness of breath.   fluticasone furoate-vilanterol (BREO ELLIPTA) 100-25 MCG/INH AEPB Inhale 1 puff into the lungs daily.   gabapentin (NEURONTIN) 300 MG capsule Take 1 capsule (300 mg total) by mouth at bedtime.   ibuprofen (ADVIL) 800 MG tablet Take 1 tablet (800 mg total) by mouth every 8 (eight) hours as needed.   omeprazole (PRILOSEC) 40 MG capsule Take 1 capsule (40 mg total) by mouth at bedtime.   promethazine-codeine (PHENERGAN WITH CODEINE) 6.25-10 MG/5ML syrup Take 5 mLs by mouth every 8 (eight) hours as needed for cough.   valACYclovir (VALTREX) 500 MG tablet Take 500 mg by mouth 2 (two) times daily.   venlafaxine XR (EFFEXOR-XR) 75 MG 24 hr capsule Take 1 capsule (75 mg total) by mouth daily with breakfast.     Allergies:   Sulfa antibiotics   Social History   Socioeconomic History   Marital status: Single  Spouse name: Not on file   Number of children: Not on file   Years of education: Not on file   Highest education level: Not on file  Occupational History   Not on file  Tobacco Use   Smoking status: Never   Smokeless tobacco: Never  Substance and Sexual Activity   Alcohol use: Yes    Comment: occas   Drug use: Yes    Types: Marijuana   Sexual activity: Not on file  Other Topics Concern   Not on file  Social History Narrative   Not on file   Social Determinants of Health   Financial Resource Strain: Not on file   Food Insecurity: Not on file  Transportation Needs: Not on file  Physical Activity: Not on file  Stress: Not on file  Social Connections: Not on file     Family History: The patient's family history includes Lung cancer in her maternal uncle; Ovarian cancer in her paternal grandmother; Prostate cancer in her paternal grandfather, paternal uncle, and paternal uncle.  ROS:   Please see the history of present illness.     All other systems reviewed and are negative.  EKGs/Labs/Other Studies Reviewed:    The following studies were reviewed today:   EKG:  EKG is  ordered today.  The ekg ordered today demonstrates normal sinus rhythm, rate 68, no ST abnormalities  Recent Labs: 06/23/2021: ALT 19; BUN 8; Creatinine 1.04; Potassium 3.8; Sodium 140 07/07/2021: Hemoglobin 12.6; Platelets 252.0  Recent Lipid Panel No results found for: CHOL, TRIG, HDL, CHOLHDL, VLDL, LDLCALC, LDLDIRECT  Physical Exam:    VS:  BP 129/85   Pulse 68   Ht 5' 2.5" (1.588 m)   Wt 187 lb 3.2 oz (84.9 kg)   SpO2 99%   BMI 33.69 kg/m     Wt Readings from Last 3 Encounters:  07/07/21 187 lb 3.2 oz (84.9 kg)  07/01/21 181 lb 12.8 oz (82.5 kg)  06/23/21 183 lb 9.6 oz (83.3 kg)     GEN:  Well nourished, well developed in no acute distress HEENT: Normal NECK: No JVD; No carotid bruits LYMPHATICS: No lymphadenopathy CARDIAC: RRR, no murmurs, rubs, gallops RESPIRATORY:  Clear to auscultation without rales, wheezing or rhonchi  ABDOMEN: Soft, non-tender, non-distended MUSCULOSKELETAL:  No edema; No deformity  SKIN: Warm and dry NEUROLOGIC:  Alert and oriented x 3 PSYCHIATRIC:  Normal affect   ASSESSMENT:    1. Shortness of breath   2. Lower leg edema    PLAN:    Dyspnea/edema: Reports issues with cough/dyspnea and occasional ankle swelling.  Check BNP, TSH.  Check echocardiogram to evaluate for structural heart disease.  Has been seen by pulmonology, suspect asthma versus sarcoid.  RTC in 6  months   Medication Adjustments/Labs and Tests Ordered: Current medicines are reviewed at length with the patient today.  Concerns regarding medicines are outlined above.  Orders Placed This Encounter  Procedures   Brain natriuretic peptide   TSH   EKG 12-Lead   ECHOCARDIOGRAM COMPLETE   No orders of the defined types were placed in this encounter.   Patient Instructions  Medication Instructions:  Your physician recommends that you continue on your current medications as directed. Please refer to the Current Medication list given to you today.  Lab Work: BNP, TSH today  If you have labs (blood work) drawn today and your tests are completely normal, you will receive your results only by: [place] (if you have MyChart) OR A paper  copy in the mail If you have any lab test that is abnormal or we need to change your treatment, we will call you to review the results.   Testing/Procedures: Your physician has requested that you have an echocardiogram. Echocardiography is a painless test that uses sound waves to create images of your heart. It provides your doctor with information about the size and shape of your heart and how well your heart's chambers and valves are working. This procedure takes approximately one hour. There are no restrictions for this procedure.  This will be done at our [facility] location:  [place]: At [facility], you and your health needs are our priority.  As part of our continuing mission to provide you with exceptional heart care, we have created designated Provider Care Teams.  These Care Teams include your primary Cardiologist (physician) and Advanced Practice Providers (APPs -  Physician Assistants and Nurse Practitioners) who all work together to provide you with the care you need, when you need it.  We recommend signing up for the patient portal called "MyChart".  Sign up information is provided on this After  Visit Summary.  MyChart is used to connect with patients for Virtual Visits (Telemedicine).  Patients are able to view lab/test results, encounter notes, upcoming appointments, etc.  Non-urgent messages can be sent to your provider as well.   To learn more about what you can do with MyChart, go to [email or web address].    Your next appointment:   6 month(s)  The format for your next appointment:   In Person  Provider:   Oswaldo Milian, MD    Signed, Donato Heinz, MD  07/08/2021 12:00 AM    [place]

## 2021-07-08 LAB — RESPIRATORY ALLERGY PROFILE REGION II ~~LOC~~
Allergen, A. alternata, m6: <0.1 kU/L
Allergen, Cedar tree, t12: 1.55 kU/L — ABNORMAL HIGH
Allergen, Comm Silver Birch, t9: <0.1 kU/L
Allergen, Cottonwood, t14: <0.1 kU/L
Allergen, D pternoyssinus,d7: <0.1 kU/L
Allergen, Mouse Urine Protein, e78: <0.1 kU/L
Allergen, Mulberry, t76: <0.1 kU/L
Allergen, Oak,t7: <0.1 kU/L
Allergen, P. notatum, m1: <0.1 kU/L
Aspergillus fumigatus, m3: <0.1 kU/L
Bermuda Grass: <0.1 kU/L
Box Elder IgE: <0.1 kU/L
CLADOSPORIUM HERBARUM (M2) IGE: <0.1 kU/L
COMMON RAGWEED (SHORT) (W1) IGE: <0.1 kU/L
Cat Dander: <0.1 kU/L
Class: 0
Class: 0
Class: 0
Class: 0
Class: 0
Class: 0
Class: 0
Class: 0
Class: 0
Class: 0
Class: 0
Class: 0
Class: 0
Class: 0
Class: 0
Class: 0
Class: 0
Class: 0
Class: 0
Class: 0
Class: 0
Class: 0
Class: 2
Class: 2
Cockroach: <0.1 kU/L
D. farinae: <0.1 kU/L
Dog Dander: <0.1 kU/L
Elm IgE: <0.1 kU/L
IgE (Immunoglobulin E), Serum: 134 kU/L — ABNORMAL HIGH (ref <=114)
Johnson Grass: <0.1 kU/L
Pecan/Hickory Tree IgE: <0.1 kU/L
Rough Pigweed  IgE: <0.1 kU/L
Sheep Sorrel IgE: <0.1 kU/L
Timothy Grass: 1.32 kU/L — ABNORMAL HIGH

## 2021-07-08 LAB — TSH: TSH: 1.04 u[IU]/mL (ref 0.450–4.500)

## 2021-07-08 LAB — BRAIN NATRIURETIC PEPTIDE: BNP: 17.4 pg/mL (ref 0.0–100.0)

## 2021-07-08 LAB — INTERPRETATION:

## 2021-07-10 ENCOUNTER — Telehealth: Payer: Self-pay | Admitting: Oncology

## 2021-07-10 ENCOUNTER — Encounter: Payer: Self-pay | Admitting: *Deleted

## 2021-07-10 NOTE — Telephone Encounter (Signed)
Scheduled appts per 8/25 sch msg. Spoke to pt who said she would call back to confirm appts.

## 2021-07-11 LAB — ANTI-SCLERODERMA ANTIBODY: Scleroderma (Scl-70) (ENA) Antibody, IgG: <1 AI

## 2021-07-11 LAB — QUANTIFERON-TB GOLD PLUS
Mitogen-NIL: >10 IU/mL
NIL: 0.04 IU/mL
QuantiFERON-TB Gold Plus: NEGATIVE
TB1-NIL: 0 IU/mL
TB2-NIL: 0 IU/mL

## 2021-07-11 LAB — CYCLIC CITRUL PEPTIDE ANTIBODY, IGG: Cyclic Citrullin Peptide Ab: <16 UNITS

## 2021-07-11 LAB — ANTI-DNA ANTIBODY, DOUBLE-STRANDED: ds DNA Ab: <1 IU/mL

## 2021-07-11 LAB — ANA: Anti Nuclear Antibody (ANA): NEGATIVE

## 2021-07-11 LAB — SJOGREN'S SYNDROME ANTIBODS(SSA + SSB)
SSA (Ro) (ENA) Antibody, IgG: <1 AI
SSB (La) (ENA) Antibody, IgG: <1 AI

## 2021-07-11 LAB — IGE: IgE (Immunoglobulin E), Serum: 129 kU/L — ABNORMAL HIGH (ref <=114)

## 2021-07-11 LAB — RHEUMATOID FACTOR: Rheumatoid fact SerPl-aCnc: <14 IU/mL (ref <14)

## 2021-07-11 LAB — ANGIOTENSIN CONVERTING ENZYME: Angiotensin-Converting Enzyme: 53 U/L (ref 9–67)

## 2021-07-16 ENCOUNTER — Encounter (HOSPITAL_COMMUNITY): Payer: Self-pay | Admitting: Oncology

## 2021-07-17 ENCOUNTER — Encounter (HOSPITAL_COMMUNITY): Payer: Self-pay | Admitting: Oncology

## 2021-07-21 ENCOUNTER — Other Ambulatory Visit: Payer: Self-pay | Admitting: Oncology

## 2021-07-21 ENCOUNTER — Ambulatory Visit: Payer: 59 | Admitting: Primary Care

## 2021-07-21 NOTE — Progress Notes (Unsigned)
[name]  [phone] [phone]     ID: JOURNIE HOWSON DOB: 03/15/1986  MR#: [id number]  AXK#:[id number]  Patient Care Team: [name], MD as PCP - General (Obstetrics and Gynecology) Rockwell Germany, RN as Oncology Nurse Navigator Mauro Kaufmann, RN as Oncology Nurse Navigator Erroll Luna, MD as Consulting Physician (General Surgery) Rumeal Cullipher, Virgie Dad, MD as Consulting Physician (Oncology) Kyung Rudd, MD as Consulting Physician (Radiation Oncology) Servando Salina, MD as Consulting Physician (Obstetrics and Gynecology) Chauncey Cruel, MD OTHER MD:  CHIEF COMPLAINT: 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   Since her last visit, she underwent chest CT on 05/29/2021 to further evaluate her persistent cough. This showed: improvement to previously-demonstrated widespread ill-defined pulmonary nodularity; no significant change to mediastinal and hilar adenopathy; improvement/resolution to previously-demonstrated hepatic lesions, though these are not well seen; findings remain most compatible with sarcoidosis; no progression of findings suspicious for metastatic disease.  She received two doses of Pembrolizumab on 04/16/2021 and 05/09/2021.  This has been on hold since due to cough.   REVIEW OF SYSTEMS: [place] received a Medrol Dosepak from her primary doctor and this significantly improved her cough.  She stopped it and then the cough has resumed.  She tells me she is only able to work about 6 hours right now but she is hoping to improve so by the middle of September she hopes to be able to go back full-term.  A detailed review of systems today was otherwise stable  COVID 19 VACCINATION STATUS: [place] x2, most recently 01/2021   HISTORY OF CURRENT ILLNESS: From the original intake  note:  Jeanne Evans herself palpated a painful left breast lump. She underwent bilateral diagnostic mammography with tomography and left breast ultrasonography at [facility] on 05/12/2020 showing: breast density category B; palpable 2.7 cm oval hypoechoic mass in left breast at 2-3 o'clock; 2.2 cm left axillary lymph node/grouping of nodes.  Accordingly on 05/29/2020 she proceeded to biopsy of the left breast area in question. The pathology from this procedure ([id number]) showed: invasive ductal carcinoma, grade 3. Prognostic indicators significant for: estrogen receptor, 10% positive with weak staining intensity and progesterone receptor, 0% negative. Proliferation marker Ki67 at 95%. HER2 equivocal by immunohistochemistry (2+), but negative by fluorescent in situ hybridization with a signals ratio 1.77 and number per cell 2.65.  Biopsy of the left axillary lymph node in question was negative and concordant  The patient's subsequent history is as detailed below.   PAST MEDICAL HISTORY: Past Medical History:  Diagnosis Date   Breast cancer ([place])    Family history of ovarian cancer    Family history of prostate cancer    GERD (gastroesophageal reflux disease)    History of asthma    has albuterol, uses PRN   History of heart murmur in childhood    History of multiple allergies   History of allergy related headaches   PAST SURGICAL HISTORY: Past Surgical History:  Procedure Laterality Date   BREAST LUMPECTOMY WITH RADIOACTIVE SEED AND SENTINEL LYMPH NODE BIOPSY Left 11/19/2020   Procedure: LEFT BREAST LUMPECTOMY X 2 WITH RADIOACTIVE SEED AND SENTINEL LYMPH NODE MAPPING;  Surgeon: Erroll Luna, MD;  Location: [facility];  Service: General;  Laterality: Left;  PECTORAL BLOCK   PORTACATH PLACEMENT Right 06/18/2020   Procedure: INSERTION PORT-A-CATH WITH ULTRASOUND GUIDANCE;  Surgeon:  Erroll Luna, MD;  Location: [facility];  Service: General;   Laterality: Right;   RE-EXCISION OF BREAST LUMPECTOMY Left 12/09/2020   Procedure: RE-EXCISION OF LEFT BREAST LUMPECTOMY;  Surgeon: Erroll Luna, MD;  Location: [facility];  Service: General;  Laterality: Left;  She reports having a boil lanced from her perineal area   FAMILY HISTORY: Family History  Problem Relation Age of Onset   Lung cancer Maternal Uncle        dx late 37s   Prostate cancer Paternal Uncle        dx late 37s   Ovarian cancer Paternal Grandmother        dx 70s   Prostate cancer Paternal Grandfather        dx 60s   Prostate cancer Paternal Uncle        dx early 37s  The patient's father is 71 and her mother 8, as of 05/2020.  The patient has two brothers and one sister. She reports ovarian cancer in her paternal grandmother, prostate cancer in her paternal grandfather and a paternal uncle, and lung cancer in a maternal uncle.   GYNECOLOGIC HISTORY:  No LMP recorded. (Menstrual status: IUD). Menarche: 35 years old Age at first live birth: 35 years old [place] P 2 LMP current, experiences spotting Contraceptive: Mirena IUD in place HRT n/a  Hysterectomy? no BSO? 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 She describes herself as single. She lives at home with her children, De'Lisha Juel Evans (age 45) and Jeanne Evans (age 22).  She is originally from [place] her home.    ADVANCED DIRECTIVES: Not in place. She intends to name her mother, Jeanne Evans, as her HCPOA.  [name] lives in [place] and can be reached at [phone].  The patient was given the appropriate documents to complete and notarized at her discretion at the time of her visit 06/04/2020   HEALTH MAINTENANCE: Social History   Tobacco Use   Smoking status: Never   Smokeless tobacco: Never  Substance Use Topics   Alcohol use: Yes    Comment: occas   Drug  use: Yes    Types: Marijuana     Colonoscopy: n/a (age)  PAP: 04/2020  Bone density: n/a (age)   Allergies  Allergen Reactions   Sulfa Antibiotics Hives    Current Outpatient Medications  Medication Sig Dispense Refill   Acetaminophen (TYLENOL) 325 MG CAPS Take by mouth.     albuterol (VENTOLIN HFA) 108 (90 Base) MCG/ACT inhaler Inhale 2 puffs into the lungs every 6 (six) hours as needed for wheezing or shortness of breath. 8 g 6   fluticasone furoate-vilanterol (BREO ELLIPTA) 100-25 MCG/INH AEPB Inhale 1 puff into the lungs daily. 60 each 5   gabapentin (NEURONTIN) 300 MG capsule Take 1 capsule (300 mg total) by mouth at bedtime. 90 capsule 4   ibuprofen (ADVIL) 800 MG tablet Take 1 tablet (800 mg total) by mouth every 8 (eight) hours as needed. 30 tablet 0   omeprazole (PRILOSEC) 40 MG capsule Take 1 capsule (40 mg total) by mouth at bedtime. 30 capsule 3   promethazine-codeine (PHENERGAN WITH CODEINE) 6.25-10 MG/5ML syrup Take 5 mLs by mouth every 8 (eight) hours as needed for cough. 120 mL 0   valACYclovir (VALTREX) 500 MG tablet Take 500 mg by mouth 2 (two) times daily.     venlafaxine XR (EFFEXOR-XR)  75 MG 24 hr capsule Take 1 capsule (75 mg total) by mouth daily with breakfast. 90 capsule 4   No current facility-administered medications for this visit.    OBJECTIVE: African-American woman who appears stated age There were no vitals filed for this visit.    There is no height or weight on file to calculate BMI.   Wt Readings from Last 3 Encounters:  07/07/21 187 lb 3.2 oz (84.9 kg)  07/01/21 181 lb 12.8 oz (82.5 kg)  06/23/21 183 lb 9.6 oz (83.3 kg)  ECOG FS:1 - Symptomatic but completely ambulatory  Sclerae unicteric, EOMs intact Wearing a mask No cervical or supraclavicular adenopathy Lungs no rales or rhonchi Heart regular rate and rhythm Abd soft, nontender, positive bowel sounds MSK no focal spinal tenderness, no upper extremity lymphedema Neuro: nonfocal, well  oriented, appropriate affect Breasts: The right breast is benign.  The left breast is status postlumpectomy and radiation.  There is no evidence of local recurrence.  Both axillae are benign   LAB RESULTS:  CMP     Component Value Date/Time   NA 140 06/23/2021 1210   K 3.8 06/23/2021 1210   CL 104 06/23/2021 1210   CO2 28 06/23/2021 1210   GLUCOSE 104 (H) 06/23/2021 1210   BUN 8 06/23/2021 1210   CREATININE 1.04 (H) 06/23/2021 1210   CALCIUM 9.5 06/23/2021 1210   PROT 7.5 06/23/2021 1210   ALBUMIN 4.0 06/23/2021 1210   AST 17 06/23/2021 1210   ALT 19 06/23/2021 1210   ALKPHOS 84 06/23/2021 1210   BILITOT 0.9 06/23/2021 1210   GFRNONAA >60 06/23/2021 1210   GFRAA >60 08/05/2020 0955   GFRAA >60 06/04/2020 0830    No results found for: TOTALPROTELP, ALBUMINELP, A1GS, A2GS, BETS, BETA2SER, GAMS, MSPIKE, SPEI  Lab Results  Component Value Date   WBC 3.2 (L) 07/07/2021   NEUTROABS 1.5 07/07/2021   HGB 12.6 07/07/2021   HCT 38.1 07/07/2021   MCV 89.2 07/07/2021   PLT 252.0 07/07/2021    No results found for: LABCA2  No components found for: [id number]  No results for input(s): INR in the last 168 hours.  No results found for: LABCA2  No results found for: IHW388  No results found for: EKC003  No results found for: KJZ791  No results found for: CA2729  No components found for: HGQUANT  No results found for: CEA1 / No results found for: CEA1   No results found for: AFPTUMOR  No results found for: CHROMOGRNA  No results found for: KPAFRELGTCHN, LAMBDASER, KAPLAMBRATIO (kappa/lambda light chains)  No results found for: HGBA, HGBA2QUANT, HGBFQUANT, HGBSQUAN (Hemoglobinopathy evaluation)   No results found for: LDH  No results found for: IRON, TIBC, IRONPCTSAT (Iron and TIBC)  No results found for: FERRITIN  Urinalysis No results found for: COLORURINE, APPEARANCEUR, LABSPEC, PHURINE, GLUCOSEU, HGBUR, BILIRUBINUR, KETONESUR, PROTEINUR, UROBILINOGEN,  NITRITE, LEUKOCYTESUR   STUDIES: No results found.   ELIGIBLE FOR AVAILABLE RESEARCH PROTOCOL: no  ASSESSMENT: 35 y.o. [place] woman status post left breast upper outer quadrant biopsy 05/29/2020 for a clinical T2 N0, stage IIB invasive ductal carcinoma, functionally triple negative, with an MIB-1 of 95%.  (1) genetics testing 06/23/2020 through the [facility] Multi-Cancer Panel found no deleterious mutations in AIP, ALK, APC, ATM, AXIN2,BAP1,  BARD1, BLM, BMPR1A, BRCA1, BRCA2, BRIP1, CASR, CDC73, CDH1, CDK4, CDKN1B, CDKN1C, CDKN2A (p14ARF), CDKN2A (p16INK4a), CEBPA, CHEK2, CTNNA1, DICER1, DIS3L2, EGFR (c.2369C>T, p.Thr790Met variant only), EPCAM (Deletion/duplication testing only), FH, FLCN, GATA2, GPC3, GREM1 (Promoter region  deletion/duplication testing only), HOXB13 (c.251G>A, p.Gly84Glu), HRAS, KIT, MAX, MEN1, MET, MITF (c.952G>A, p.Glu318Lys variant only), MLH1, MSH2, MSH3, MSH6, MUTYH, NBN, NF1, NF2, NTHL1, PALB2, PDGFRA, PHOX2B, PMS2, POLD1, POLE, POT1, PRKAR1A, PTCH1, PTEN, RAD50, RAD51C, RAD51D, RB1, RECQL4, RET, RNF43, RUNX1, SDHAF2, SDHA (sequence changes only), SDHB, SDHC, SDHD, SMAD4, SMARCA4, SMARCB1, SMARCE1, STK11, SUFU, TERC, TERT, TMEM127, TP53, TSC1, TSC2, VHL, WRN and WT1.   (2) neoadjuvant chemotherapy consisting of cyclophosphamide and doxorubicin in dose dense fashion x4 started 06/19/2020, completed 08/05/2020, followed by paclitaxel and carboplatin weekly x12 starting 08/26/2020, completing 4 doses 09/23/2020  (a) echocardiogram on 06/13/2020 showed an EF of 60-65%  (b) paclitaxel and carboplatin discontinued after 4 doses because of neuropathy and cytopenias  (3) status post left lumpectomy and sentinel lymph node sampling 11/19/2020 for a residual yp T1c ypN0 invasive ductal carcinoma involving the superior margin  (a) additional surgery 12/09/2020 cleared the margin in question  (b) repeat prognostic panel confirms triple negative disease  (4) adjuvant radiation :  01/22/2021 through 03/10/2021 Site Technique Total Dose (Gy) Dose per Fx (Gy) Completed Fx Beam Energies  Breast, Left: Breast_Lt 3D 50.4/50.4 1.8 28/28 6X  Breast, Left: Breast_Lt_Bst 3D 10/10 2 5/5 6X   (a) received capecitabine sensitization, started 01/26/2021  (5) pembrolizumab/Keytruda began on 04/16/2021, discontinued 05/11/2021 (after 2 doses) with persistent cough  (6) molecular pathology from the 11/19/2020 sample requested 06/24/2021   PLAN: Khyla is still struggling with her cough, which is troublesome both to her work and to her sleep.  We have tried numerous things of which the only one that appears to work is steroids.  There is a concern whether she may have sarcoidosis which would require different type of treatment.  She is scheduled to see Dr. Chase Caller in pulmonary for further evaluation and treatment.  She hopes to be able to have recovered sufficiently by mid-September to work full-time.  I will be glad to write her a note regarding that as appropriate  At this point though we are discontinuing the pembrolizumab.  I am not going to be able to tell whether that is causing pneumonitis or otherwise causing her cough.  We are starting observation alone.  Total encounter time 35 minutes.Sarajane Jews C. Leighana Neyman, MD 07/21/21 1:05 PM Medical Oncology and Hematology [facility] [place], [place] [address] Tel. [phone]    Fax. [phone]   I, Wilburn Mylar, am acting as scribe for Dr. Virgie Dad. Bolivar Koranda.  I, Lurline Del MD, have reviewed the above documentation for accuracy and completeness, and I agree with the above.    *Total Encounter Time as defined by the Centers for Medicare and Medicaid Services includes, in addition to the face-to-face time of a patient visit (documented in the note above) non-face-to-face time: obtaining and reviewing outside history, ordering and reviewing medications, tests or procedures, care  coordination (communications with other health care professionals or caregivers) and documentation in the medical record.

## 2021-07-23 ENCOUNTER — Encounter (HOSPITAL_COMMUNITY): Payer: Self-pay

## 2021-07-23 ENCOUNTER — Ambulatory Visit: Payer: 59 | Attending: Surgery

## 2021-07-23 ENCOUNTER — Other Ambulatory Visit: Payer: Self-pay

## 2021-07-23 DIAGNOSIS — Z483 Aftercare following surgery for neoplasm: Secondary | ICD-10-CM

## 2021-07-23 DIAGNOSIS — C50412 Malignant neoplasm of upper-outer quadrant of left female breast: Secondary | ICD-10-CM | POA: Diagnosis present

## 2021-07-23 DIAGNOSIS — M25612 Stiffness of left shoulder, not elsewhere classified: Secondary | ICD-10-CM | POA: Diagnosis present

## 2021-07-23 DIAGNOSIS — R293 Abnormal posture: Secondary | ICD-10-CM | POA: Insufficient documentation

## 2021-07-23 DIAGNOSIS — L599 Disorder of the skin and subcutaneous tissue related to radiation, unspecified: Secondary | ICD-10-CM | POA: Diagnosis present

## 2021-07-23 DIAGNOSIS — R600 Localized edema: Secondary | ICD-10-CM

## 2021-07-23 DIAGNOSIS — Z170 Estrogen receptor positive status [ER+]: Secondary | ICD-10-CM | POA: Insufficient documentation

## 2021-07-23 NOTE — Therapy (Signed)
[place], [place], [address] Phone: [phone]   Fax:  [phone]  Physical Therapy Evaluation  Patient Details  Name: Jeanne Evans MRN: [id number] Date of Birth: Sep 10, 1986 Referring Provider (PT): Dr. Erroll Luna   Encounter Date: 07/23/2021   PT End of Session - 07/23/21 1518     Visit Number 1    Number of Visits 13    Date for PT Re-Evaluation 09/03/21    PT Start Time 8099    PT Stop Time 8338    PT Time Calculation (min) 46 min    Activity Tolerance Patient tolerated treatment well    Behavior During Therapy [facility] for tasks assessed/performed             Past Medical History:  Diagnosis Date   Breast cancer ([place])    Family history of ovarian cancer    Family history of prostate cancer    GERD (gastroesophageal reflux disease)    History of asthma    has albuterol, uses PRN   History of heart murmur in childhood    History of multiple allergies     Past Surgical History:  Procedure Laterality Date   BREAST LUMPECTOMY WITH RADIOACTIVE SEED AND SENTINEL LYMPH NODE BIOPSY Left 11/19/2020   Procedure: LEFT BREAST LUMPECTOMY X 2 WITH RADIOACTIVE SEED AND SENTINEL LYMPH NODE MAPPING;  Surgeon: Erroll Luna, MD;  Location: [place];  Service: General;  Laterality: Left;  PECTORAL BLOCK   PORTACATH PLACEMENT Right 06/18/2020   Procedure: INSERTION PORT-A-CATH WITH ULTRASOUND GUIDANCE;  Surgeon: Erroll Luna, MD;  Location: [place];  Service: General;  Laterality: Right;   RE-EXCISION OF BREAST LUMPECTOMY Left 12/09/2020   Procedure: RE-EXCISION OF LEFT BREAST LUMPECTOMY;  Surgeon: Erroll Luna, MD;  Location: [place];  Service: General;  Laterality: Left;    There were no vitals filed for this visit.    Subjective Assessment - 07/23/21 1311     Subjective My hand started swelling about 2 weeks ago especially the dorsum  of the hand but not the fingers.  From the elbow down to my hand is achy. The achyness I had in my posterior arm is gone now. The MLD does seem to help the achiness.I had a knot in my forearm and I put the sleeve on and it went down.  Now my arm is sore,I noticed it around my thumb  I have to start back to immunotherapy the 22nd of September q 3 weeks for a year. The pec muscle is so tight/sore again    Pertinent History Patient was diagnosed on 05/12/2020 with left breast cancer. She underwent neoadjuvant chemotherapy 06/19/2020-09/23/2020 and had a left lumpectomy and sentinel node biopsy (6 negative nodes) on 11/19/2020.  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 Numbness in posterior arm is a little better.    Patient Stated Goals Decrease pain and improve shoulder ROM, decrease swelling    Currently in Pain? Yes    Pain Score 7     Pain Orientation Left    Pain Descriptors / Indicators Aching    Pain Onset 1 to 4 weeks ago    Pain Frequency Intermittent    Aggravating Factors  work activities    Pain Relieving Factors rest    Effect of Pain on Daily Activities disturbed sleep    Multiple Pain Sites Yes    Pain Score 7    Pain Location Axilla  Pain Orientation Left    Pain Descriptors / Indicators Tightness;Sore;Tender    Pain Type Acute pain    Pain Onset 1 to 4 weeks ago    Pain Frequency Intermittent    Aggravating Factors  reaching, stretching    Pain Relieving Factors not using arms overhead    Effect of Pain on Daily Activities limits reaching                [facility] PT Assessment - 07/23/21 0001       Assessment   Medical Diagnosis s/p left lumpectomy and sentinel node biopsy    Referring Provider (PT) Dr. [name] [place]    Onset Date/Surgical Date 11/19/20    Prior Therapy yes      Precautions   Precaution Comments lymphedema risk      Restrictions   Weight Bearing Restrictions No      Balance Screen   Has the patient fallen in the past 6 months No     Has the patient had a decrease in activity level because of a fear of falling?  No    Is the patient reluctant to leave their home because of a fear of falling?  No      Home Ecologist residence    Living Arrangements Children      Prior Function   Level of Independence Independent    Vocation Part time employment   6 hours     Cognition   Overall Cognitive Status Within Functional Limits for tasks assessed      Observation/Other Assessments   Observations incisions healed      Posture/Postural Control   Posture/Postural Control Postural limitations    Postural Limitations Rounded Shoulders;Forward head      AROM   Left Shoulder Flexion 155 Degrees   axillary tightness   Left Shoulder ABduction 140 Degrees   axillary tightness   Left Shoulder External Rotation 85 Degrees      Palpation   Palpation comment Tender axillary border of left pectorals, left forearm               LYMPHEDEMA/ONCOLOGY QUESTIONNAIRE - 07/23/21 0001       Type   Cancer Type Left breast cancer      Surgeries   Lumpectomy Date 11/19/20    Sentinel Lymph Node Biopsy Date 11/19/20    Other Surgery Date 12/09/20    Number Lymph Nodes Removed 6      Treatment   Active Chemotherapy Treatment No    Past Chemotherapy Treatment Yes    Active Radiation Treatment No    Past Radiation Treatment Yes    Current Hormone Treatment No    Past Hormone Therapy No      What other symptoms do you have   Are you Having Heaviness or Tightness Yes   lefft axilla/arm   Are you having Pain Yes   left axilla   Are you having pitting edema No    Is it Hard or Difficult finding clothes that fit No    Do you have infections No    Is there Decreased scar mobility Yes   large nodular area under axillary incision     Right Upper Extremity Lymphedema   15 cm Proximal to Olecranon Process 36.7 cm    10 cm Proximal to Olecranon Process 36.1 cm    Olecranon Process 28.4 cm    15 cm  Proximal to Ulnar Styloid Process 26.8 cm  10 cm Proximal to Ulnar Styloid Process 22.8 cm    Just Proximal to Ulnar Styloid Process 17 cm    Across Hand at [employer] 20.3 cm    At [facility] of Thumb 6.5 cm      Left Upper Extremity Lymphedema   15 cm Proximal to Olecranon Process 35.7 cm    10 cm Proximal to Olecranon Process 35.3 cm    Olecranon Process 27.3 cm    15 cm Proximal to Ulnar Styloid Process 25.5 cm    10 cm Proximal to Ulnar Styloid Process 22 cm    Just Proximal to Ulnar Styloid Process 16.8 cm    Across Hand at [employer] 20.8 cm    At [place] of 2nd Digit 6.5 cm             L-DEX FLOWSHEETS - 07/23/21 1300       L-DEX LYMPHEDEMA SCREENING   Measurement Type Unilateral    L-DEX MEASUREMENT EXTREMITY Upper Extremity    POSITION  Standing    DOMINANT SIDE Right    At Risk Side Left    BASELINE SCORE (UNILATERAL) -1.2    L-DEX SCORE (UNILATERAL) 6.4    VALUE CHANGE (UNILAT) 7.6                    Objective measurements completed on examination: See above findings.                     PT Long Term Goals - 07/23/21 1527       PT LONG TERM GOAL #1   Title Patient will demonstrate independence in HEP   and that she has regained full function post operatively compared to baselines.    Time 6    Period Weeks    Status New    Target Date 09/03/21      PT LONG TERM GOAL #2   Title Patient will increase left shoulder active flexion to >/= 165 degrees for increased ease reaching.    Baseline 155 today    Time 6    Period Weeks    Status New    Target Date 09/03/21      PT LONG TERM GOAL #3   Title Patient will increase left shoulder active abduction to >/= 165 degrees for increased ease reaching.    Baseline 140 today    Time 6    Period Weeks    Status New    Target Date 09/03/21      PT LONG TERM GOAL #4   Title Pt will be independent in self management of left UE lymphedema    Time 6    Period Weeks    Status  New    Target Date 09/03/21      PT LONG TERM GOAL #5   Title Patient will report >/= 50% decrease in pain/tightness for increased tolerance of daily tasks.    Time 6    Period Weeks    Status New    Target Date 09/03/21                    Plan - 07/23/21 1518     Clinical Impression Statement Pt returns to therapy with increased complaints of left hand/arm swelling( although improved today per pt) which started about 2 weeks ago.  She indicated that her left forearm was very swollen a few days ago and it felt better when she did some Manual  lymphatic drainage and wore her sleeve.  SHe has not been wearing her sleeve consistently. She does appear to have some swelling today medial to her thumb, however UE measurements on the left are smaller than her dominate side today.  We did a SOZO screen today and over the last month she has moved from green into the yellow zone with 7.6 point difference from baseline.  She also has decreased left UE shoulder ROM in all directions with complaints of tightness at the axillary border of the pectoralis.  She was instructed to start wearing her sleeve with the gauntlet on a daily basis for atleast 12 hours and to remove for sleeping.  She will continue MLD daily, and she will resume her supine wand stretches and wall slides for shoulder tightness. She will benefit from skilled PT to address deficits and return to PLOF    Personal Factors and Comorbidities Comorbidity 3+    Comorbidities Left breast CA fuunctionally triple neg, chemo, radiation    Stability/Clinical Decision Making Stable/Uncomplicated    Clinical Decision Making Low    Rehab Potential Excellent    PT Frequency 2x / week    PT Duration 6 weeks    PT Treatment/Interventions ADLs/Self Care Home Management;Therapeutic exercise;Patient/family education;Passive range of motion;Manual lymph drainage;Manual techniques;Scar mobilization    PT Next Visit Plan assess hand/arm swelling, wrap  prn, MFR to axilla, STM to pectoralis and lats, PROM    PT Home Exercise Plan sleeve and gauntlet daily unless fingers swell, MLD daily, resume shoulder wand and wall slides for shoulder ROM    Consulted and Agree with Plan of Care Patient             Patient will benefit from skilled therapeutic intervention in order to improve the following deficits and impairments:  Postural dysfunction, Decreased range of motion, Impaired UE functional use, Pain, Decreased knowledge of precautions, Decreased scar mobility, Increased fascial restricitons, Increased edema, Impaired flexibility  Visit Diagnosis: Aftercare following surgery for neoplasm  Stiffness of left shoulder, not elsewhere classified  Abnormal posture  Disorder of the skin and subcutaneous tissue related to radiation, unspecified  Malignant neoplasm of upper-outer quadrant of left breast in female, estrogen receptor positive ([place])  Localized edema     Problem List Patient Active Problem List   Diagnosis Date Noted   Lymphedema of left arm 02/02/2021   Drug-induced neutropenia ([place]) 09/30/2020   Genetic testing 06/26/2020   Family history of ovarian cancer    Family history of prostate cancer    Malignant neoplasm of upper-outer quadrant of left breast in female, estrogen receptor negative ([place]) 06/03/2020    Claris Pong, PT 07/23/2021, 3:32 PM  [place], [place], [address] Phone: [phone]   Fax:  [phone]  Name: SEVILLA MURTAGH MRN: [id number] Date of Birth: 08-Jul-1986

## 2021-07-28 ENCOUNTER — Ambulatory Visit (HOSPITAL_COMMUNITY): Payer: 59 | Attending: Internal Medicine

## 2021-07-28 ENCOUNTER — Other Ambulatory Visit: Payer: Self-pay

## 2021-07-28 DIAGNOSIS — R0602 Shortness of breath: Secondary | ICD-10-CM | POA: Insufficient documentation

## 2021-07-28 LAB — ECHOCARDIOGRAM COMPLETE
Area-P 1/2: 5.13 cm2
S' Lateral: 2.9 cm

## 2021-07-30 ENCOUNTER — Ambulatory Visit (INDEPENDENT_AMBULATORY_CARE_PROVIDER_SITE_OTHER): Payer: 59 | Admitting: Plastic Surgery

## 2021-07-30 ENCOUNTER — Encounter: Payer: Self-pay | Admitting: Plastic Surgery

## 2021-07-30 ENCOUNTER — Other Ambulatory Visit: Payer: Self-pay

## 2021-07-30 VITALS — BP 135/86 | HR 71 | Ht 63.0 in | Wt 185.0 lb

## 2021-07-30 DIAGNOSIS — Z171 Estrogen receptor negative status [ER-]: Secondary | ICD-10-CM

## 2021-07-30 DIAGNOSIS — C50412 Malignant neoplasm of upper-outer quadrant of left female breast: Secondary | ICD-10-CM

## 2021-07-30 NOTE — Progress Notes (Signed)
Referring Provider [name], MD [address] [place],  [place] [address]   CC:  Chief Complaint  Patient presents with   Advice Only      Jeanne Evans is an 35 y.o. female.  HPI: Patient presents to discuss breast asymmetry after left sided breast conservation therapy for cancer.  She feels like her left side and her right side are not quite as full as she would like.  She is bothered by the fact that the right nipple is lower than the left.  She would like the left side to be a bit fuller.  Her lumpectomy was in January 2022 which followed neoadjuvant chemotherapy.  Her radiation finished in April 2022.  She is not a diabetic and does not smoke tobacco products.  Allergies  Allergen Reactions   Sulfa Antibiotics Hives    Outpatient Encounter Medications as of 07/30/2021  Medication Sig   Acetaminophen (TYLENOL) 325 MG CAPS Take by mouth.   albuterol (VENTOLIN HFA) 108 (90 Base) MCG/ACT inhaler Inhale 2 puffs into the lungs every 6 (six) hours as needed for wheezing or shortness of breath.   fluticasone furoate-vilanterol (BREO ELLIPTA) 100-25 MCG/INH AEPB Inhale 1 puff into the lungs daily.   gabapentin (NEURONTIN) 300 MG capsule Take 1 capsule (300 mg total) by mouth at bedtime.   ibuprofen (ADVIL) 800 MG tablet Take 1 tablet (800 mg total) by mouth every 8 (eight) hours as needed.   omeprazole (PRILOSEC) 40 MG capsule Take 1 capsule (40 mg total) by mouth at bedtime.   promethazine-codeine (PHENERGAN WITH CODEINE) 6.25-10 MG/5ML syrup Take 5 mLs by mouth every 8 (eight) hours as needed for cough.   valACYclovir (VALTREX) 500 MG tablet Take 500 mg by mouth 2 (two) times daily.   venlafaxine XR (EFFEXOR-XR) 75 MG 24 hr capsule Take 1 capsule (75 mg total) by mouth daily with breakfast.   No facility-administered encounter medications on file as of 07/30/2021.     Past Medical History:  Diagnosis Date   Breast cancer ([place])    Family history of ovarian cancer     Family history of prostate cancer    GERD (gastroesophageal reflux disease)    History of asthma    has albuterol, uses PRN   History of heart murmur in childhood    History of multiple allergies     Past Surgical History:  Procedure Laterality Date   BREAST LUMPECTOMY WITH RADIOACTIVE SEED AND SENTINEL LYMPH NODE BIOPSY Left 11/19/2020   Procedure: LEFT BREAST LUMPECTOMY X 2 WITH RADIOACTIVE SEED AND SENTINEL LYMPH NODE MAPPING;  Surgeon: Erroll Luna, MD;  Location: [place];  Service: General;  Laterality: Left;  PECTORAL BLOCK   PORTACATH PLACEMENT Right 06/18/2020   Procedure: INSERTION PORT-A-CATH WITH ULTRASOUND GUIDANCE;  Surgeon: Erroll Luna, MD;  Location: [place];  Service: General;  Laterality: Right;   RE-EXCISION OF BREAST LUMPECTOMY Left 12/09/2020   Procedure: RE-EXCISION OF LEFT BREAST LUMPECTOMY;  Surgeon: Erroll Luna, MD;  Location: [place];  Service: General;  Laterality: Left;    Family History  Problem Relation Age of Onset   Lung cancer Maternal Uncle        dx late 74s   Prostate cancer Paternal Uncle        dx late 41s   Ovarian cancer Paternal Grandmother        dx 10s   Prostate cancer Paternal Grandfather  dx 3s   Prostate cancer Paternal Uncle        dx early 40s    Social History   Social History Narrative   Not on file     Review of Systems General: Denies fevers, chills, weight loss CV: Denies chest pain, shortness of breath, palpitations  Physical Exam Vitals with BMI 07/30/2021 07/07/2021 07/01/2021  Height '5\' 3"'$  5' 2.5" '5\' 2"'$   Weight 185 lbs 187 lbs 3 oz 181 lbs 13 oz  BMI 32.78 [id number] 0000000  Systolic A999333 [id number] 0000000  Diastolic 86 85 80  Pulse 71 68 83    General:  No acute distress,  Alert and oriented, Non-Toxic, Normal speech and affect Breast: She has an upper outer quadrant scar on the left side from her lumpectomy.  There is some depression in this area and  tethering from the scarring.  The left nipple is 2 to 3 cm higher than the right side.  She has mild to moderate radiation changes on the left side.  She is fuller on the left side superiorly compared to the right but not by much.  No obvious scars on the right side.  Assessment/Plan Patient is interested in correction of breast asymmetry after treatment of her left-sided breast cancer.  I believe she would be a good candidate for a small reduction on the right side followed by fat grafting to bilateral breasts.  That would make her fuller and more symmetric which seems to be in line with her goals.  We did discuss implants if she wanted to be significantly fuller but that is not something she is interested in at this point.  I do believe that we could get a satisfactory correction with fat alone without the down the line issues that may come from implants.  We discussed the risks of the procedure that include bleeding, infection, damage to surrounding structures need for additional procedures.  All of her questions were answered and plan to move forward.  [name] 07/30/2021, 3:04 PM

## 2021-07-31 ENCOUNTER — Encounter: Payer: Self-pay | Admitting: Primary Care

## 2021-07-31 ENCOUNTER — Encounter: Payer: Self-pay | Admitting: *Deleted

## 2021-07-31 ENCOUNTER — Ambulatory Visit (INDEPENDENT_AMBULATORY_CARE_PROVIDER_SITE_OTHER): Payer: 59 | Admitting: Internal Medicine

## 2021-07-31 ENCOUNTER — Ambulatory Visit (INDEPENDENT_AMBULATORY_CARE_PROVIDER_SITE_OTHER): Payer: 59 | Admitting: Primary Care

## 2021-07-31 DIAGNOSIS — R053 Chronic cough: Secondary | ICD-10-CM

## 2021-07-31 DIAGNOSIS — J4520 Mild intermittent asthma, uncomplicated: Secondary | ICD-10-CM | POA: Diagnosis not present

## 2021-07-31 DIAGNOSIS — J45909 Unspecified asthma, uncomplicated: Secondary | ICD-10-CM | POA: Insufficient documentation

## 2021-07-31 HISTORY — DX: Unspecified asthma, uncomplicated: J45.909

## 2021-07-31 LAB — PULMONARY FUNCTION TEST
DL/VA % pred: 106 %
DL/VA: 4.82 ml/min/mmHg/L
DLCO cor % pred: 93 %
DLCO cor: 20.09 ml/min/mmHg
DLCO unc % pred: 91 %
DLCO unc: 19.57 ml/min/mmHg
FEF 25-75 Post: 2.15 L/sec
FEF 25-75 Pre: 1.23 L/sec
FEF2575-%Change-Post: 75 %
FEF2575-%Pred-Post: 73 %
FEF2575-%Pred-Pre: 42 %
FEV1-%Change-Post: 16 %
FEV1-%Pred-Post: 81 %
FEV1-%Pred-Pre: 69 %
FEV1-Post: 2.06 L
FEV1-Pre: 1.76 L
FEV1FVC-%Change-Post: 5 %
FEV1FVC-%Pred-Pre: 86 %
FEV6-%Change-Post: 10 %
FEV6-%Pred-Post: 89 %
FEV6-%Pred-Pre: 80 %
FEV6-Post: 2.66 L
FEV6-Pre: 2.41 L
FEV6FVC-%Pred-Post: 101 %
FEV6FVC-%Pred-Pre: 101 %
FVC-%Change-Post: 10 %
FVC-%Pred-Post: 87 %
FVC-%Pred-Pre: 79 %
FVC-Post: 2.66 L
FVC-Pre: 2.41 L
Post FEV1/FVC ratio: 77 %
Post FEV6/FVC ratio: 100 %
Pre FEV1/FVC ratio: 73 %
Pre FEV6/FVC Ratio: 100 %
RV % pred: 117 %
RV: 1.72 L
TLC % pred: 88 %
TLC: 4.35 L

## 2021-07-31 MED ORDER — MONTELUKAST SODIUM 10 MG PO TABS
10.0000 mg | ORAL_TABLET | Freq: Every day | ORAL | 11 refills | Status: DC
Start: 2021-07-31 — End: 2022-01-06

## 2021-07-31 NOTE — Progress Notes (Signed)
PFT done today. 

## 2021-07-31 NOTE — Progress Notes (Signed)
Virtual Visit via Telephone Note  I connected with Jeanne Evans on 07/31/21 at 12:00 PM EDT by telephone and verified that I am speaking with the correct person using two identifiers.  Location: Patient: Home Provider: Office   I discussed the limitations, risks, security and privacy concerns of performing an evaluation and management service by telephone and the availability of in person appointments. I also discussed with the patient that there may be a patient responsible charge related to this service. The patient expressed understanding and agreed to proceed.   History of Present Illness: 35 year old female, never smoked. PMH significant for breast cancer. Patient of Dr. Chase Caller, seen for initial consult on 07/01/21 for chronic cough.  Previous [facility] pulmonary encounter: 07/01/2021 -  Dr. Chase Caller  Chief Complaint  Patient presents with   Consult    Pt states concerns of a cough from November last year came after chemo.    HPI Jeanne Evans 35 y.o. - African Ameriican female. Referred by Dr  Jana Hakim for chronic cough. Used to live In [place], [place] and relocated to [place] 6 years ago. Mom has asthma NOS.   She is non-smoker. Denies drug use.  Reports insidious onset of cough since Nov-Dec 2021 but this was after dx of breast cancer and start of chemo. Denies ace inhibitor. Denies herceptin use. PReceded Keytruda. She got 2 doses of keytruda recently (as above) but [place] did not make her cough worse or better. Is currently on hold. Reports cough as severe and associatd with clear mucus. Loses sleep. Worse at night. Associated with gaggin and chest hurt. Does lose sleep. Is on PPI/H2 blockae and cough some better iwh this. HAs had 2 rounds of oral steroids in summer 2022 but this helped. No steroids x 3 weeks. Cough associated with wheezing.   No personal hx of sarcoid, asthma,  Did have frequent bronchitis > 6 years ago when living in [place] Did have heart burn with  XRT Did have seasonal allergies in [place] but not in [place]   She had CT chest July 2022 - personally visualized. Sarcoid is beig raised but only thing I see is a reudced LUL nodular density compared to a year ago. Mild mediastinal adenopathy + and stable.   FeNO done today is elvated 0 94ppb    07/31/2021- Interim hx  Patient presents today for telvisit/review PFTs. During her last visit with Dr. Chase Caller her cough was felt to be related to asthma with sarcoid in the differential diagnosis. She was started on BREO 100 one puff daily. She was ordered for CBC with diff, RAST allergy panel.   Her symptoms have improved significantly. She still has mild cough and intermittent wheezing with exertion. PFTs today showed mild obstruction with positive bronchodilator response. Eosinophil absolute 300 and IgE 134. She has allergies to cedar tree and timothy grass. Denies chest pain or discomfort.     Observations/Objective:  - Able to speak in full sentences; no overt shortness of breath or wheezing   - CT chest in July 2022 showed improvement in previously demonstrated widespread ill define pulmonary nodularity. 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 No progression of findings suspicious for metastatic disease.   07/01/21 >> FENO 94  07/07/21 >> Eos 300, IgE 134 , RAST pos cedar tree and timothy grass  Assessment and Plan:  Allergic asthma: - Cough significantly improved with low dose ICS/LABA. She is still having mild intermittent cough and wheezing with exertion  -  PFTs showed mild obstruction  with positive BD response. Normal diffusion capacity. She has allergies to cedar tree and timothy grass. Eosinophils were 300, IgE 134. FENO 94 - Continue Breo 119mg one puff daily  - Start Montelukast '10mg'$  at bedtime  - If patient is still having breakthrough asthma symptoms can increase BREO to 2075m daily    Follow Up Instructions:  - 3 months with Dr. RaAurelio Brash I discussed the  assessment and treatment plan with the patient. The patient was provided an opportunity to ask questions and all were answered. The patient agreed with the plan and demonstrated an understanding of the instructions.   The patient was advised to call back or seek an in-person evaluation if the symptoms worsen or if the condition fails to improve as anticipated.  I provided 30 minutes of non-face-to-face time during this encounter.   ElMartyn EhrichNP

## 2021-07-31 NOTE — Patient Instructions (Addendum)
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 RaAurelio Brash Montelukast Tablets What is this medication? MONTELUKAST (mon te LOO kast) prevents and treats the symptoms of asthma and allergies. It works by decreasing inflammation in the airways, making it easier to breathe. Do not use this medication to treat a sudden asthma attack. This medicine may be used for other purposes; ask your health care provider or pharmacist if you have questions. COMMON BRAND NAME(S): Singulair What should I tell my care team before I take this medication? They need to know if you have any of these conditions: Liver disease An unusual or allergic reaction to montelukast, other medications, foods, dyes, or preservatives Pregnant or trying to get pregnant Breast-feeding How should I use this medication? Take this medication by mouth with water. Take it as directed on the prescription label at the same time every day. You can take this medication with or without food. If it upsets your stomach, take it with food. Keep taking it unless your care team tells you to stop. A special MedGuide will be given to you by the pharmacist with each prescription and refill. Be sure to read this information carefully each time. Talk to your care team about the use of this medication in children. While this medication may be prescribed for children as young as 15 years for selected conditions, precautions do apply. Overdosage: If you think you have taken too much of this medicine contact a poison control center or emergency room at once. NOTE: This medicine is only for you. Do not share this medicine with others. What if I miss a  dose? If you miss a dose, skip it. Take your next dose at the normal time. Do not take extra or 2 doses at the same time to make up for the missed dose. What may interact with this medication? Medications for seizures like phenytoin, phenobarbital, and carbamazepine Rifabutin Rifampin This list may not describe all possible interactions. Give your health care provider a list of all the medicines, herbs, non-prescription drugs, or dietary supplements you use. Also tell them if you smoke, drink alcohol, or use illegal drugs. Some items may interact with your medicine. What should I watch for while using this medication? Visit your health care provider for regular checks on your progress. Tell your health care provider if your allergy or asthma symptoms do not improve. Take your medication even when you do not have symptoms. If you have asthma, talk to your health care provider about what to do in an acute asthma attack. Always have your rescue medication for asthma attacks with you. Patients and their families should watch for new or worsening thoughts of suicide or depression. Also watch for sudden changes in feelings such as feeling anxious, agitated, panicky, irritable, hostile, aggressive, impulsive, severely restless, overly excited and hyperactive, or not being able to sleep. Any worsening of mood or thoughts of suicide or dying should be reported to your health care provider right away. What side effects may I notice from receiving this medication? Side effects that you should report to your care team as soon as possible: Allergic reactions-skin rash, itching, hives, swelling of the face,  lips, tongue, or throat Flu-like symptoms-fever, chills, muscle pain, cough, headache, fatigue Mood and behavior changes such as anxiety, nervousness, confusion, hallucinations, irritability, hostility, thoughts of suicide or self-harm, worsening mood, feelings of depression Pain, tingling, or numbness in the  hands or feet Sinus pain or pressure around the face or forehead Trouble sleeping Vivid dreams or nightmares Side effects that usually do not require medical attention (report to your care team if they continue or are bothersome): Cough Diarrhea Headache Runny or stuffy nose Sore throat Stomach pain This list may not describe all possible side effects. Call your doctor for medical advice about side effects. You may report side effects to FDA at 1-800-FDA-1088. Where should I keep my medication? Keep out of the reach of children and pets. Store at room temperature between 15 and 30 degrees C (59 and 86 degrees F). Protect from light and moisture. Protect from light and moisture. Keep the container tightly closed. Get rid of any unused medication after the expiration date. To get rid of medications that are no longer needed or expired: Take the medication to a medication take-back program. Check with your pharmacy or law enforcement to find a location. If you cannot return the medication, check the label or package insert to see if the medication should be thrown out in the garbage or flushed down the toilet. If you are not sure, ask your care team. If it is safe to put in the trash, empty the medication out of the container. Mix the medication with cat litter, dirt, coffee grounds, or other unwanted substance. Seal the mixture in a bag or container. Put it in the trash. NOTE: This sheet is a summary. It may not cover all possible information. If you have questions about this medicine, talk to your doctor, pharmacist, or health care provider.  2022 [employer] Standard (2020-11-28 11:01:47)

## 2021-08-05 ENCOUNTER — Other Ambulatory Visit: Payer: Self-pay

## 2021-08-05 DIAGNOSIS — C50412 Malignant neoplasm of upper-outer quadrant of left female breast: Secondary | ICD-10-CM

## 2021-08-05 DIAGNOSIS — Z171 Estrogen receptor negative status [ER-]: Secondary | ICD-10-CM

## 2021-08-06 ENCOUNTER — Inpatient Hospital Stay: Payer: 59 | Attending: Oncology

## 2021-08-06 ENCOUNTER — Other Ambulatory Visit: Payer: Self-pay

## 2021-08-06 ENCOUNTER — Inpatient Hospital Stay (HOSPITAL_BASED_OUTPATIENT_CLINIC_OR_DEPARTMENT_OTHER): Payer: 59 | Admitting: Adult Health

## 2021-08-06 ENCOUNTER — Inpatient Hospital Stay: Payer: 59

## 2021-08-06 ENCOUNTER — Other Ambulatory Visit: Payer: 59

## 2021-08-06 VITALS — BP 135/93 | HR 67 | Temp 97.5°F | Resp 18 | Ht 63.0 in | Wt 185.4 lb

## 2021-08-06 DIAGNOSIS — Z95828 Presence of other vascular implants and grafts: Secondary | ICD-10-CM

## 2021-08-06 DIAGNOSIS — Z5112 Encounter for antineoplastic immunotherapy: Secondary | ICD-10-CM | POA: Insufficient documentation

## 2021-08-06 DIAGNOSIS — C50412 Malignant neoplasm of upper-outer quadrant of left female breast: Secondary | ICD-10-CM | POA: Diagnosis present

## 2021-08-06 DIAGNOSIS — Z171 Estrogen receptor negative status [ER-]: Secondary | ICD-10-CM

## 2021-08-06 LAB — CBC WITH DIFFERENTIAL (CANCER CENTER ONLY)
Abs Immature Granulocytes: 0.01 10*3/uL (ref 0.00–0.07)
Basophils Absolute: 0 10*3/uL (ref 0.0–0.1)
Basophils Relative: 1 %
Eosinophils Absolute: 0.4 10*3/uL (ref 0.0–0.5)
Eosinophils Relative: 10 %
HCT: 37.4 % (ref 36.0–46.0)
Hemoglobin: 12.7 g/dL (ref 12.0–15.0)
Immature Granulocytes: 0 %
Lymphocytes Relative: 36 %
Lymphs Abs: 1.5 10*3/uL (ref 0.7–4.0)
MCH: 29.2 pg (ref 26.0–34.0)
MCHC: 34 g/dL (ref 30.0–36.0)
MCV: 86 fL (ref 80.0–100.0)
Monocytes Absolute: 0.4 10*3/uL (ref 0.1–1.0)
Monocytes Relative: 11 %
Neutro Abs: 1.7 10*3/uL (ref 1.7–7.7)
Neutrophils Relative %: 42 %
Platelet Count: 258 10*3/uL (ref 150–400)
RBC: 4.35 MIL/uL (ref 3.87–5.11)
RDW: 14.5 % (ref 11.5–15.5)
WBC Count: 4.1 10*3/uL (ref 4.0–10.5)
nRBC: 0 % (ref 0.0–0.2)

## 2021-08-06 LAB — CMP (CANCER CENTER ONLY)
ALT: 23 U/L (ref 0–44)
AST: 18 U/L (ref 15–41)
Albumin: 4.3 g/dL (ref 3.5–5.0)
Alkaline Phosphatase: 86 U/L (ref 38–126)
Anion gap: 8 (ref 5–15)
BUN: 10 mg/dL (ref 6–20)
CO2: 25 mmol/L (ref 22–32)
Calcium: 9.7 mg/dL (ref 8.9–10.3)
Chloride: 106 mmol/L (ref 98–111)
Creatinine: 0.84 mg/dL (ref 0.44–1.00)
GFR, Estimated: >60 mL/min (ref >60)
Glucose, Bld: 91 mg/dL (ref 70–99)
Potassium: 4 mmol/L (ref 3.5–5.1)
Sodium: 139 mmol/L (ref 135–145)
Total Bilirubin: 0.7 mg/dL (ref 0.3–1.2)
Total Protein: 7.9 g/dL (ref 6.5–8.1)

## 2021-08-06 MED ORDER — SODIUM CHLORIDE 0.9% FLUSH
10.0000 mL | INTRAVENOUS | Status: DC | PRN
  Administered 2021-08-06: 10 mL

## 2021-08-06 MED ORDER — SODIUM CHLORIDE 0.9 % IV SOLN
200.0000 mg | Freq: Once | INTRAVENOUS | Status: AC
  Administered 2021-08-06: 200 mg via INTRAVENOUS
  Filled 2021-08-06: qty 8

## 2021-08-06 MED ORDER — HEPARIN SOD (PORK) LOCK FLUSH 100 UNIT/ML IV SOLN
500.0000 [IU] | Freq: Once | INTRAVENOUS | Status: AC | PRN
  Administered 2021-08-06: 500 [IU]

## 2021-08-06 MED ORDER — SODIUM CHLORIDE 0.9% FLUSH: 10.0000 mL | Freq: Once | INTRAVENOUS | Status: DC

## 2021-08-06 MED ORDER — SODIUM CHLORIDE 0.9 % IV SOLN: Freq: Once | INTRAVENOUS | Status: AC

## 2021-08-06 NOTE — Patient Instructions (Signed)
[facility]  Discharge Instructions: ?Thank you for choosing [facility] to provide your oncology and hematology care.  ? ?If you have a lab appointment with the Cancer Center, please go directly to the Cancer Center and check in at the registration area. ?  ?Wear comfortable clothing and clothing appropriate for easy access to any Portacath or PICC line.  ? ?We strive to give you quality time with your provider. You may need to reschedule your appointment if you arrive late (15 or more minutes).  Arriving late affects you and other patients whose appointments are after yours.  Also, if you miss three or more appointments without notifying the office, you may be dismissed from the clinic at the provider?s discretion.    ?  ?For prescription refill requests, have your pharmacy contact our office and allow 72 hours for refills to be completed.   ? ?Today you received the following chemotherapy and/or immunotherapy agents: Keytruda ?  ?To help prevent nausea and vomiting after your treatment, we encourage you to take your nausea medication as directed. ? ?BELOW ARE SYMPTOMS THAT SHOULD BE REPORTED IMMEDIATELY: ?*FEVER GREATER THAN 100.4 F (38 ?C) OR HIGHER ?*CHILLS OR SWEATING ?*NAUSEA AND VOMITING THAT IS NOT CONTROLLED WITH YOUR NAUSEA MEDICATION ?*UNUSUAL SHORTNESS OF BREATH ?*UNUSUAL BRUISING OR BLEEDING ?*URINARY PROBLEMS (pain or burning when urinating, or frequent urination) ?*BOWEL PROBLEMS (unusual diarrhea, constipation, pain near the anus) ?TENDERNESS IN MOUTH AND THROAT WITH OR WITHOUT PRESENCE OF ULCERS (sore throat, sores in mouth, or a toothache) ?UNUSUAL RASH, SWELLING OR PAIN  ?UNUSUAL VAGINAL DISCHARGE OR ITCHING  ? ?Items with * indicate a potential emergency and should be followed up as soon as possible or go to the Emergency Department if any problems should occur. ? ?Please show the CHEMOTHERAPY ALERT CARD or IMMUNOTHERAPY ALERT CARD at check-in to the  Emergency Department and triage nurse. ? ?Should you have questions after your visit or need to cancel or reschedule your appointment, please contact [facility]  Dept: [phone]  and follow the prompts.  Office hours are 8:00 a.m. to 4:30 p.m. Monday - Friday. Please note that voicemails left after 4:00 p.m. may not be returned until the following business day.  We are closed weekends and major holidays. You have access to a nurse at all times for urgent questions. Please call the main number to the clinic Dept: [phone] and follow the prompts. ? ? ?For any non-urgent questions, you may also contact your provider using MyChart. We now offer e-Visits for anyone 18 and older to request care online for non-urgent symptoms. For details visit [email or web address]. ?  ?Also download the MyChart app! Go to the app store, search "MyChart", open the app, select [facility], and log in with your MyChart username and password. ? ?Due to Covid, a mask is required upon entering the hospital/clinic. If you do not have a mask, one will be given to you upon arrival. For doctor visits, patients may have 1 support person aged 18 or older with them. For treatment visits, patients cannot have anyone with them due to current Covid guidelines and our immunocompromised population.  ? ?

## 2021-08-06 NOTE — Progress Notes (Signed)
[place]  [phone] [phone]     ID: Jeanne Evans DOB: 13-Aug-1986  MR#: [id number]  IDP#:[id number]  Patient Care Team: [name], MD as PCP - General (Obstetrics and Gynecology) Rockwell Germany, RN as Oncology Nurse Navigator Mauro Kaufmann, RN as Oncology Nurse Navigator Erroll Luna, MD as Consulting Physician (General Surgery) Magrinat, Virgie Dad, MD as Consulting Physician (Oncology) Kyung Rudd, MD as Consulting Physician (Radiation Oncology) Servando Salina, MD as Consulting Physician (Obstetrics and Gynecology) Scot Dock, NP OTHER MD:  CHIEF COMPLAINT: 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   Since her last visit, she underwent chest CT on 05/29/2021 to further evaluate her persistent cough. This showed: improvement to previously-demonstrated widespread ill-defined pulmonary nodularity; no significant change to mediastinal and hilar adenopathy; improvement/resolution to previously-demonstrated hepatic lesions, though these are not well seen; findings remain most compatible with sarcoidosis; no progression of findings suspicious for metastatic disease.  She received two doses of Pembrolizumab on 04/16/2021 and 05/09/2021.  This has been on hold since due to cough.  She was evaluated by Dr. Chase Caller and her cough was attributed to asthma, not pembrolizumab.  She began treatment with inhalers and singulair and has improved tremendously.  She was cleared to restart Pembrolizumab.  She says she is feeling very well today and has no concerns.     REVIEW OF SYSTEMS: Review of Systems  Constitutional:  Negative for appetite change, chills, fatigue, fever and unexpected weight change.  HENT:   Negative for hearing loss, lump/mass and trouble swallowing.   Eyes:  Negative for eye  problems and icterus.  Respiratory:  Negative for chest tightness, cough and shortness of breath.   Cardiovascular:  Negative for chest pain, leg swelling and palpitations.  Gastrointestinal:  Negative for abdominal distention, abdominal pain, constipation, diarrhea, nausea and vomiting.  Endocrine: Negative for hot flashes.  Genitourinary:  Negative for difficulty urinating.   Musculoskeletal:  Negative for arthralgias.  Skin:  Negative for itching and rash.  Neurological:  Negative for dizziness, extremity weakness, headaches and numbness.  Hematological:  Negative for adenopathy. Does not bruise/bleed easily.  Psychiatric/Behavioral:  Negative for depression. The patient is not nervous/anxious.     COVID 19 VACCINATION STATUS: Jeanne Evans x2, most recently 01/2021   HISTORY OF CURRENT ILLNESS: From the original intake note:  Jeanne Evans herself palpated a painful left breast lump. She underwent bilateral diagnostic mammography with tomography and left breast ultrasonography at [facility] on 05/12/2020 showing: breast density category B; palpable 2.7 cm oval hypoechoic mass in left breast at 2-3 o'clock; 2.2 cm left axillary lymph node/grouping of nodes.  Accordingly on 05/29/2020 she proceeded to biopsy of the left breast area in question. The pathology from this procedure ([id number]) showed: invasive ductal carcinoma, grade 3. Prognostic indicators significant for: estrogen receptor, 10% positive with weak staining intensity and progesterone receptor, 0% negative. Proliferation marker Ki67 at 95%. HER2 equivocal by immunohistochemistry (2+), but negative by fluorescent in situ hybridization with a signals ratio 1.77 and number per cell 2.65.  Biopsy of the left axillary lymph node in question was negative and concordant  The patient's subsequent history is as detailed below.   PAST MEDICAL HISTORY: Past Medical History:  Diagnosis Date   Asthma 07/31/2021   Breast  cancer [facility])    Family history of ovarian cancer    Family  history of prostate cancer    GERD (gastroesophageal reflux disease)    History of asthma    has albuterol, uses PRN   History of heart murmur in childhood    History of multiple allergies   History of allergy related headaches   PAST SURGICAL HISTORY: Past Surgical History:  Procedure Laterality Date   BREAST LUMPECTOMY WITH RADIOACTIVE SEED AND SENTINEL LYMPH NODE BIOPSY Left 11/19/2020   Procedure: LEFT BREAST LUMPECTOMY X 2 WITH RADIOACTIVE SEED AND SENTINEL LYMPH NODE MAPPING;  Surgeon: Erroll Luna, MD;  Location: [place];  Service: General;  Laterality: Left;  PECTORAL BLOCK   PORTACATH PLACEMENT Right 06/18/2020   Procedure: INSERTION PORT-A-CATH WITH ULTRASOUND GUIDANCE;  Surgeon: Erroll Luna, MD;  Location: [place];  Service: General;  Laterality: Right;   RE-EXCISION OF BREAST LUMPECTOMY Left 12/09/2020   Procedure: RE-EXCISION OF LEFT BREAST LUMPECTOMY;  Surgeon: Erroll Luna, MD;  Location: [place];  Service: General;  Laterality: Left;  She reports having a boil lanced from her perineal area   FAMILY HISTORY: Family History  Problem Relation Age of Onset   Lung cancer Maternal Uncle        dx late 3s   Prostate cancer Paternal Uncle        dx late 34s   Ovarian cancer Paternal Grandmother        dx 85s   Prostate cancer Paternal Grandfather        dx 20s   Prostate cancer Paternal Uncle        dx early 30s  The patient's father is 21 and her mother 55, as of 05/2020.  The patient has two brothers and one sister. She reports ovarian cancer in her paternal grandmother, prostate cancer in her paternal grandfather and a paternal uncle, and lung cancer in a maternal uncle.   GYNECOLOGIC HISTORY:  No LMP recorded. (Menstrual status: IUD). Menarche: 35 years old Age at first live birth: 35 years old [place] P 2 LMP current, experiences  spotting Contraceptive: Mirena IUD in place HRT n/a  Hysterectomy? no BSO? 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 She describes herself as single. She lives at home with her children, Jeanne Evans (age 87) and Jeanne Evans (age 49).  She is originally from [place] her home.    ADVANCED DIRECTIVES: Not in place. She intends to name her mother, Jeanne Evans, as her HCPOA.  Jeanne Evans lives in [place] and can be reached at [phone].  The patient was given the appropriate documents to complete and notarized at her discretion at the time of her visit 06/04/2020   HEALTH MAINTENANCE: Social History   Tobacco Use   Smoking status: Never   Smokeless tobacco: Never  Substance Use Topics   Alcohol use: Yes    Comment: occas   Drug use: Yes    Types: Marijuana     Colonoscopy: n/a (age)  PAP: 04/2020  Bone density: n/a (age)   Allergies  Allergen Reactions   Sulfa Antibiotics Hives    Current Outpatient Medications  Medication Sig Dispense Refill   Acetaminophen (TYLENOL) 325 MG CAPS Take by mouth. (Patient not taking: Reported on 07/31/2021)     albuterol (VENTOLIN HFA) 108 (90 Base) MCG/ACT inhaler Inhale 2 puffs into the lungs every 6 (six) hours as needed for wheezing or shortness of breath. 8 g 6  fluticasone furoate-vilanterol (BREO ELLIPTA) 100-25 MCG/INH AEPB Inhale 1 puff into the lungs daily. 60 each 5   gabapentin (NEURONTIN) 300 MG capsule Take 1 capsule (300 mg total) by mouth at bedtime. 90 capsule 4   ibuprofen (ADVIL) 800 MG tablet Take 1 tablet (800 mg total) by mouth every 8 (eight) hours as needed. (Patient not taking: Reported on 07/31/2021) 30 tablet 0   montelukast (SINGULAIR) 10 MG tablet Take 1 tablet (10 mg total) by mouth at bedtime. 30 tablet 11   omeprazole (PRILOSEC) 40 MG capsule Take 1 capsule (40 mg total) by mouth at  bedtime. 30 capsule 3   promethazine-codeine (PHENERGAN WITH CODEINE) 6.25-10 MG/5ML syrup Take 5 mLs by mouth every 8 (eight) hours as needed for cough. (Patient not taking: Reported on 07/31/2021) 120 mL 0   valACYclovir (VALTREX) 500 MG tablet Take 500 mg by mouth 2 (two) times daily.     venlafaxine XR (EFFEXOR-XR) 75 MG 24 hr capsule Take 1 capsule (75 mg total) by mouth daily with breakfast. 90 capsule 4   No current facility-administered medications for this visit.   Facility-Administered Medications Ordered in Other Visits  Medication Dose Route Frequency Provider Last Rate Last Admin   sodium chloride flush (NS) 0.9 % injection 10 mL  10 mL Intravenous Once Magrinat, Virgie Dad, MD        OBJECTIVE: African-American woman who appears stated age Vitals:   08/06/21 0951  BP: (!) 135/93  Pulse: 67  Resp: 18  Temp: (!) 97.5 F (36.4 C)  SpO2: 100%     Body mass index is 32.84 kg/m.   Wt Readings from Last 3 Encounters:  08/06/21 185 lb 6.4 oz (84.1 kg)  07/30/21 185 lb (83.9 kg)  07/07/21 187 lb 3.2 oz (84.9 kg)  ECOG FS:1 - Symptomatic but completely ambulatory GENERAL: Patient is a well appearing female in no acute distress HEENT:  Sclerae anicteric.  Oropharynx clear and moist. No ulcerations or evidence of oropharyngeal candidiasis. Neck is supple.  NODES:  No cervical, supraclavicular, or axillary lymphadenopathy palpated.  BREAST EXAM:  Deferred. LUNGS:  Clear to auscultation bilaterally.  No wheezes or rhonchi. HEART:  Regular rate and rhythm. No murmur appreciated. ABDOMEN:  Soft, nontender.  Positive, normoactive bowel sounds. No organomegaly palpated. MSK:  No focal spinal tenderness to palpation. Full range of motion bilaterally in the upper extremities. EXTREMITIES:  No peripheral edema.   SKIN:  Clear with no obvious rashes or skin changes. No nail dyscrasia. NEURO:  Nonfocal. Well oriented.  Appropriate affect.    LAB RESULTS:  CMP     Component Value  Date/Time   NA 140 06/23/2021 1210   K 3.8 06/23/2021 1210   CL 104 06/23/2021 1210   CO2 28 06/23/2021 1210   GLUCOSE 104 (H) 06/23/2021 1210   BUN 8 06/23/2021 1210   CREATININE 1.04 (H) 06/23/2021 1210   CALCIUM 9.5 06/23/2021 1210   PROT 7.5 06/23/2021 1210   ALBUMIN 4.0 06/23/2021 1210   AST 17 06/23/2021 1210   ALT 19 06/23/2021 1210   ALKPHOS 84 06/23/2021 1210   BILITOT 0.9 06/23/2021 1210   GFRNONAA >60 06/23/2021 1210   GFRAA >60 08/05/2020 0955   GFRAA >60 06/04/2020 0830    No results found for: TOTALPROTELP, ALBUMINELP, A1GS, A2GS, BETS, BETA2SER, GAMS, MSPIKE, SPEI  Lab Results  Component Value Date   WBC 4.1 08/06/2021   NEUTROABS 1.7 08/06/2021   HGB 12.7 08/06/2021   HCT 37.4 08/06/2021  MCV 86.0 08/06/2021   PLT 258 08/06/2021    No results found for: LABCA2  No components found for: [id number]  No results for input(s): INR in the last 168 hours.  No results found for: LABCA2  No results found for: KZL935  No results found for: TSV779  No results found for: TJQ300  No results found for: CA2729  No components found for: HGQUANT  No results found for: CEA1 / No results found for: CEA1   No results found for: AFPTUMOR  No results found for: CHROMOGRNA  No results found for: KPAFRELGTCHN, LAMBDASER, KAPLAMBRATIO (kappa/lambda light chains)  No results found for: HGBA, HGBA2QUANT, HGBFQUANT, HGBSQUAN (Hemoglobinopathy evaluation)   No results found for: LDH  No results found for: IRON, TIBC, IRONPCTSAT (Iron and TIBC)  No results found for: FERRITIN  Urinalysis No results found for: COLORURINE, APPEARANCEUR, LABSPEC, PHURINE, GLUCOSEU, HGBUR, BILIRUBINUR, KETONESUR, PROTEINUR, UROBILINOGEN, NITRITE, LEUKOCYTESUR   STUDIES: ECHOCARDIOGRAM COMPLETE  Result Date: 07/28/2021    ECHOCARDIOGRAM REPORT   Patient Name:   LORIEANN ARGUETA Date of Exam: 07/28/2021 Medical Rec #:  [id number]          Height:       62.5 in Accession #:     [id number]         Weight:       187.2 lb Date of Birth:  20-Dec-1985         BSA:          1.870 m Patient Age:    34 years           BP:           129/85 mmHg Patient Gender: F                  HR:           82 bpm. Exam Location:  [place] Procedure: 2D Echo, Cardiac Doppler, Color Doppler, 3D Echo and Strain Analysis Indications:    R06.02 Shortness of breath  History:        Patient has prior history of Echocardiogram examinations, most                 recent 06/13/2020. Breast cancer.  Sonographer:    Basilia Jumbo BS, RDCS Referring Phys: [phone] [place]  1. Left ventricular ejection fraction, by estimation, is 65 to 70%. Left ventricular ejection fraction by 3D volume is 64 %. The left ventricle has normal function. The left ventricle has no regional wall motion abnormalities. Left ventricular diastolic  parameters were normal. The average left ventricular global longitudinal strain is -27.2 %. The global longitudinal strain is normal.  2. Right ventricular systolic function is normal. The right ventricular size is normal. Tricuspid regurgitation signal is inadequate for assessing PA pressure.  3. The mitral valve is normal in structure. No evidence of mitral valve regurgitation. No evidence of mitral stenosis.  4. The aortic valve is tricuspid. Aortic valve regurgitation is not visualized. No aortic stenosis is present.  5. The inferior vena cava is normal in size with greater than 50% respiratory variability, suggesting right atrial pressure of 3 mmHg. Comparison(s): A prior study was performed on 06/13/20. 06/13/20 EF 60-65%. GLS -21.5%. FINDINGS  Left Ventricle: Left ventricular ejection fraction, by estimation, is 65 to 70%. Left ventricular ejection fraction by 3D volume is 64 %. The left ventricle has normal function. The left ventricle has no regional wall motion abnormalities. The average left ventricular global longitudinal  strain is -27.2 %. The global longitudinal  strain is normal. The left ventricular internal cavity size was normal in size. There is no left ventricular hypertrophy. Left ventricular diastolic parameters were normal. Right Ventricle: The right ventricular size is normal. No increase in right ventricular wall thickness. Right ventricular systolic function is normal. Tricuspid regurgitation signal is inadequate for assessing PA pressure. Left Atrium: Left atrial size was normal in size. Right Atrium: Right atrial size was normal in size. Pericardium: There is no evidence of pericardial effusion. Mitral Valve: The mitral valve is normal in structure. No evidence of mitral valve regurgitation. No evidence of mitral valve stenosis. Tricuspid Valve: The tricuspid valve is normal in structure. Tricuspid valve regurgitation is not demonstrated. No evidence of tricuspid stenosis. Aortic Valve: The aortic valve is tricuspid. Aortic valve regurgitation is not visualized. No aortic stenosis is present. Pulmonic Valve: The pulmonic valve was normal in structure. Pulmonic valve regurgitation is not visualized. No evidence of pulmonic stenosis. Aorta: The aortic root and ascending aorta are structurally normal, with no evidence of dilitation. Venous: The inferior vena cava is normal in size with greater than 50% respiratory variability, suggesting right atrial pressure of 3 mmHg. IAS/Shunts: The atrial septum is grossly normal.  LEFT VENTRICLE PLAX 2D LVIDd:         4.40 cm         Diastology LVIDs:         2.90 cm         LV e' medial:    14.10 cm/s LV PW:         0.80 cm         LV E/e' medial:  6.3 LV IVS:        0.80 cm         LV e' lateral:   17.40 cm/s LVOT diam:     1.90 cm         LV E/e' lateral: 5.1 LV SV:         70 LV SV Index:   38              2D LVOT Area:     2.84 cm        Longitudinal                                Strain                                2D Strain GLS  -31.5 %                                (A2C):                                2D Strain  GLS  -23.4 %                                (A3C):                                2D Strain GLS  -26.7 %                                (  A4C):                                2D Strain GLS  -27.2 %                                Avg:                                 3D Volume EF                                LV 3D EF:    Left                                             ventricul                                             ar                                             ejection                                             fraction                                             by 3D                                             volume is                                             64 %.                                 3D Volume EF:                                3D EF:        64 %                                LV EDV:       105 ml  LV ESV:       38 ml                                LV SV:        67 ml RIGHT VENTRICLE RV Basal diam:  3.10 cm RV S prime:     15.50 cm/s TAPSE (M-mode): 2.5 cm LEFT ATRIUM             Index       RIGHT ATRIUM           Index LA diam:        3.20 cm 1.71 cm/m  RA Pressure: 3.00 mmHg LA Vol (A2C):   37.3 ml 19.95 ml/m RA Area:     14.00 cm LA Vol (A4C):   28.5 ml 15.24 ml/m RA Volume:   34.10 ml  18.24 ml/m LA Biplane Vol: 35.9 ml 19.20 ml/m  AORTIC VALVE LVOT Vmax:   139.00 cm/s LVOT Vmean:  96.900 cm/s LVOT VTI:    0.248 m  AORTA Ao Root diam: 2.50 cm Ao Asc diam:  2.50 cm MV E velocity: 88.60 cm/s  TRICUSPID VALVE MV A velocity: 71.30 cm/s  Estimated RAP:  3.00 mmHg MV E/A ratio:  1.24                            SHUNTS                            Systemic VTI:  0.25 m                            Systemic Diam: 1.90 cm Rudean Haskell MD Electronically signed by Rudean Haskell MD Signature Date/Time: 07/28/2021/3:46:32 PM    Final      ELIGIBLE FOR AVAILABLE RESEARCH PROTOCOL: no  ASSESSMENT: 35 y.o. Jeanne woman status post left breast upper  outer quadrant biopsy 05/29/2020 for a clinical T2 N0, stage IIB invasive ductal carcinoma, functionally triple negative, with an MIB-1 of 95%.  (1) genetics testing 06/23/2020 through the [facility] Multi-Cancer Panel found no deleterious mutations in AIP, ALK, APC, ATM, AXIN2,BAP1,  BARD1, BLM, BMPR1A, BRCA1, BRCA2, BRIP1, CASR, CDC73, CDH1, CDK4, CDKN1B, CDKN1C, CDKN2A (p14ARF), CDKN2A (p16INK4a), CEBPA, CHEK2, CTNNA1, DICER1, DIS3L2, EGFR (c.2369C>T, p.Thr790Met variant only), EPCAM (Deletion/duplication testing only), FH, FLCN, GATA2, GPC3, GREM1 (Promoter region deletion/duplication testing only), HOXB13 (c.251G>A, p.Gly84Glu), HRAS, KIT, MAX, MEN1, MET, MITF (c.952G>A, p.Glu318Lys variant only), MLH1, MSH2, MSH3, MSH6, MUTYH, NBN, NF1, NF2, NTHL1, PALB2, PDGFRA, PHOX2B, PMS2, POLD1, POLE, POT1, PRKAR1A, PTCH1, PTEN, RAD50, RAD51C, RAD51D, RB1, RECQL4, RET, RNF43, RUNX1, SDHAF2, SDHA (sequence changes only), SDHB, SDHC, SDHD, SMAD4, SMARCA4, SMARCB1, SMARCE1, STK11, SUFU, TERC, TERT, TMEM127, TP53, TSC1, TSC2, VHL, WRN and WT1.   (2) neoadjuvant chemotherapy consisting of cyclophosphamide and doxorubicin in dose dense fashion x4 started 06/19/2020, completed 08/05/2020, followed by paclitaxel and carboplatin weekly x12 starting 08/26/2020, completing 4 doses 09/23/2020  (a) echocardiogram on 06/13/2020 showed an EF of 60-65%  (b) paclitaxel and carboplatin discontinued after 4 doses because of neuropathy and cytopenias  (3) status post left lumpectomy and sentinel lymph node sampling 11/19/2020 for a residual yp T1c ypN0 invasive ductal carcinoma involving the superior margin  (a) additional surgery 12/09/2020 cleared the margin in question  (b) repeat prognostic panel confirms  triple negative disease  (4) adjuvant radiation : 01/22/2021 through 03/10/2021 Site Technique Total Dose (Gy) Dose per Fx (Gy) Completed Fx Beam Energies  Breast, Left: Breast_Lt 3D 50.4/50.4 1.8 28/28 6X  Breast, Left:  Breast_Lt_Bst 3D 10/10 2 5/5 6X   (a) received capecitabine sensitization, started 01/26/2021  (5) pembrolizumab/Keytruda began on 04/16/2021, discontinued 05/11/2021 (after 2 doses) with persistent cough  (6) molecular pathology from the 11/19/2020 sample requested 06/24/2021   PLAN: Jeanne Evans is here for f/u of her triple negative breast cancer.  She has no clinical or radiographic sign of breast cancer recurrence.  Her cough has improved greatly and she continues to take the medication prescribed by Dr. Chase Caller.  She is cleared to restart Pembrolizumab and we will resume this today.  We will restart checking her TSH.    Jeanne Evans will return every 3 weeks for pembrolizumab and we will see her with every other treatment.  She knows to call for any questions that may arise between now and her next appointment.  We are happy to see her sooner if needed.   Total encounter time 20 minutes.Wilber Bihari, NP 08/06/21 10:22 AM Medical Oncology and Hematology [facility] [place], [place] [address] Tel. [phone]    Fax. [phone]    *Total Encounter Time as defined by the Centers for Medicare and Medicaid Services includes, in addition to the face-to-face time of a patient visit (documented in the note above) non-face-to-face time: obtaining and reviewing outside history, ordering and reviewing medications, tests or procedures, care coordination (communications with other health care professionals or caregivers) and documentation in the medical record.

## 2021-08-06 NOTE — Patient Instructions (Signed)
Implanted Port Home Guide An implanted port is a device that is placed under the skin. It is usually placed in the chest. The device can be used to give IV medicine, to take blood, or for dialysis. You may have an implanted port if: You need IV medicine that would be irritating to the small veins in your hands or arms. You need IV medicines, such as antibiotics, for a long period of time. You need IV nutrition for a long period of time. You need dialysis. When you have a port, your health care provider can choose to use the port instead of veins in your arms for these procedures. You may have fewer limitations when using a port than you would if you used other types of long-term IVs, and you will likely be able to return to normal activities after your incision heals. An implanted port has two main parts: Reservoir. The reservoir is the part where a needle is inserted to give medicines or draw blood. The reservoir is round. After it is placed, it appears as a small, raised area under your skin. Catheter. The catheter is a thin, flexible tube that connects the reservoir to a vein. Medicine that is inserted into the reservoir goes into the catheter and then into the vein. How is my port accessed? To access your port: A numbing cream may be placed on the skin over the port site. Your health care provider will put on a mask and sterile gloves. The skin over your port will be cleaned carefully with a germ-killing soap and allowed to dry. Your health care provider will gently pinch the port and insert a needle into it. Your health care provider will check for a blood return to make sure the port is in the vein and is not clogged. If your port needs to remain accessed to get medicine continuously (constant infusion), your health care provider will place a clear bandage (dressing) over the needle site. The dressing and needle will need to be changed every week, or as told by your health care provider. What  is flushing? Flushing helps keep the port from getting clogged. Follow instructions from your health care provider about how and when to flush the port. Ports are usually flushed with saline solution or a medicine called heparin. The need for flushing will depend on how the port is used: If the port is only used from time to time to give medicines or draw blood, the port may need to be flushed: Before and after medicines have been given. Before and after blood has been drawn. As part of routine maintenance. Flushing may be recommended every 4-6 weeks. If a constant infusion is running, the port may not need to be flushed. Throw away any syringes in a disposal container that is meant for sharp items (sharps container). You can buy a sharps container from a pharmacy, or you can make one by using an empty hard plastic bottle with a cover. How long will my port stay implanted? The port can stay in for as long as your health care provider thinks it is needed. When it is time for the port to come out, a surgery will be done to remove it. The surgery will be similar to the procedure that was done to put the port in. Follow these instructions at home:  Flush your port as told by your health care provider. If you need an infusion over several days, follow instructions from your health care provider about how   to take care of your port site. Make sure you: Wash your hands with soap and water before you change your dressing. If soap and water are not available, use alcohol-based hand sanitizer. Change your dressing as told by your health care provider. Place any used dressings or infusion bags into a plastic bag. Throw that bag in the trash. Keep the dressing that covers the needle clean and dry. Do not get it wet. Do not use scissors or sharp objects near the tube. Keep the tube clamped, unless it is being used. Check your port site every day for signs of infection. Check for: Redness, swelling, or  pain. Fluid or blood. Pus or a bad smell. Protect the skin around the port site. Avoid wearing bra straps that rub or irritate the site. Protect the skin around your port from seat belts. Place a soft pad over your chest if needed. Bathe or shower as told by your health care provider. The site may get wet as long as you are not actively receiving an infusion. Return to your normal activities as told by your health care provider. Ask your health care provider what activities are safe for you. Carry a medical alert card or wear a medical alert bracelet at all times. This will let health care providers know that you have an implanted port in case of an emergency. Get help right away if: You have redness, swelling, or pain at the port site. You have fluid or blood coming from your port site. You have pus or a bad smell coming from the port site. You have a fever. Summary Implanted ports are usually placed in the chest for long-term IV access. Follow instructions from your health care provider about flushing the port and changing bandages (dressings). Take care of the area around your port by avoiding clothing that puts pressure on the area, and by watching for signs of infection. Protect the skin around your port from seat belts. Place a soft pad over your chest if needed. Get help right away if you have a fever or you have redness, swelling, pain, drainage, or a bad smell at the port site. This information is not intended to replace advice given to you by your health care provider. Make sure you discuss any questions you have with your health care provider. Document Revised: 01/21/2021 Document Reviewed: 03/17/2020 Elsevier Patient Education  2022 [employer]vier Inc.  

## 2021-08-08 ENCOUNTER — Encounter: Payer: Self-pay | Admitting: Oncology

## 2021-08-10 ENCOUNTER — Other Ambulatory Visit: Payer: Self-pay

## 2021-08-10 ENCOUNTER — Ambulatory Visit: Payer: 59

## 2021-08-10 DIAGNOSIS — Z483 Aftercare following surgery for neoplasm: Secondary | ICD-10-CM | POA: Diagnosis not present

## 2021-08-10 DIAGNOSIS — R293 Abnormal posture: Secondary | ICD-10-CM

## 2021-08-10 DIAGNOSIS — C50412 Malignant neoplasm of upper-outer quadrant of left female breast: Secondary | ICD-10-CM

## 2021-08-10 DIAGNOSIS — Z170 Estrogen receptor positive status [ER+]: Secondary | ICD-10-CM

## 2021-08-10 DIAGNOSIS — M25612 Stiffness of left shoulder, not elsewhere classified: Secondary | ICD-10-CM

## 2021-08-10 DIAGNOSIS — L599 Disorder of the skin and subcutaneous tissue related to radiation, unspecified: Secondary | ICD-10-CM

## 2021-08-10 DIAGNOSIS — R600 Localized edema: Secondary | ICD-10-CM

## 2021-08-10 NOTE — Therapy (Signed)
Jeanne Evans, [place], [address] Phone: [phone]   Fax:  [phone]  Physical Therapy Treatment  Patient Details  Name: Jeanne Evans MRN: [id number] Date of Birth: 13-Feb-1986 Referring Provider (PT): Dr. Erroll Luna   Encounter Date: 08/10/2021   PT End of Session - 08/10/21 1503     Visit Number 2    Number of Visits 13    Date for PT Re-Evaluation 09/03/21    PT Start Time 1402    PT Stop Time 1500    PT Time Calculation (min) 58 min    Activity Tolerance Patient tolerated treatment well    Behavior During Therapy [facility] for tasks assessed/performed             Past Medical History:  Diagnosis Date   Asthma 07/31/2021   Breast cancer ([place])    Family history of ovarian cancer    Family history of prostate cancer    GERD (gastroesophageal reflux disease)    History of asthma    has albuterol, uses PRN   History of heart murmur in childhood    History of multiple allergies     Past Surgical History:  Procedure Laterality Date   BREAST LUMPECTOMY WITH RADIOACTIVE SEED AND SENTINEL LYMPH NODE BIOPSY Left 11/19/2020   Procedure: LEFT BREAST LUMPECTOMY X 2 WITH RADIOACTIVE SEED AND SENTINEL LYMPH NODE MAPPING;  Surgeon: Erroll Luna, MD;  Location: [facility];  Service: General;  Laterality: Left;  PECTORAL BLOCK   PORTACATH PLACEMENT Right 06/18/2020   Procedure: INSERTION PORT-A-CATH WITH ULTRASOUND GUIDANCE;  Surgeon: Erroll Luna, MD;  Location: [facility];  Service: General;  Laterality: Right;   RE-EXCISION OF BREAST LUMPECTOMY Left 12/09/2020   Procedure: RE-EXCISION OF LEFT BREAST LUMPECTOMY;  Surgeon: Erroll Luna, MD;  Location: [facility];  Service: General;  Laterality: Left;    There were no vitals filed for this visit.   Subjective Assessment - 08/10/21 1357     Subjective The sleeve hurts my arm more now for some  reason. It feels too tight on my arm, but before it felt good. I didn't wear it at all in the last week.  I have been wearing the gauntlet without the sleeve and doing the MLD, I have been doing stretches too.  My arm always feels to heavy and tired.   Pertinent History Patient was diagnosed on 05/12/2020 with left breast cancer. She underwent neoadjuvant chemotherapy 06/19/2020-09/23/2020 and had a left lumpectomy and sentinel node biopsy (6 negative nodes) on 11/19/2020.  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 Numbness in posterior arm is a little better.    Patient Stated Goals Decrease pain and improve shoulder ROM, decrease swelling    Currently in Pain? No/denies    Pain Score 0-No pain    Pain Orientation Left    Pain Descriptors / Indicators Aching    Multiple Pain Sites No                   LYMPHEDEMA/ONCOLOGY QUESTIONNAIRE - 08/10/21 0001       Right Upper Extremity Lymphedema   At Axilla  37.4 cm (P)     15 cm Proximal to Olecranon Process 36.8 cm    10 cm Proximal to Olecranon Process 35.9 cm    Olecranon Process 28.4 cm    15 cm Proximal to Ulnar Styloid Process 27.5 cm    10 cm Proximal to  Ulnar Styloid Process 23 cm    Just Proximal to Ulnar Styloid Process 16.9 cm    Across Hand at [employer] 19.9 cm    At [place] of 2nd Digit 6.5 cm    At [facility] of Thumb 6.5 cm      Left Upper Extremity Lymphedema   At Axilla  35.8 cm (P)     15 cm Proximal to Olecranon Process 35.8 cm    10 cm Proximal to Olecranon Process 35.9 cm    Olecranon Process 27.6 cm    15 cm Proximal to Ulnar Styloid Process 26.2 cm    10 cm Proximal to Ulnar Styloid Process 22.6 cm    Just Proximal to Ulnar Styloid Process 16.9 cm    Across Hand at [employer] 19.9 cm    At [place] of 2nd Digit 6.7 cm             L-DEX FLOWSHEETS - 08/10/21 1400       L-DEX LYMPHEDEMA SCREENING   Measurement Type Unilateral    L-DEX MEASUREMENT EXTREMITY Upper Extremity    POSITION   Standing    DOMINANT SIDE Right    At Risk Side Left    BASELINE SCORE (UNILATERAL) -1.2    L-DEX SCORE (UNILATERAL) 0.7    VALUE CHANGE (UNILAT) 1.9                       [facility] Adult PT Treatment/Exercise - 08/10/21 0001       Manual Therapy   Edema Management Pt remeasured.  Left arm measurements increased slightly but still smaller than dominant right side. Pt was fit with a size 4 Black sleeve which was much more comfortable for her. Performed SOZO screen    Passive ROM PROM left shoulder flex, scaption, abd, ER                          PT Long Term Goals - 07/23/21 1527       PT LONG TERM GOAL #1   Title Patient will demonstrate independence in HEP   and that she has regained full function post operatively compared to baselines.    Time 6    Period Weeks    Status New    Target Date 09/03/21      PT LONG TERM GOAL #2   Title Patient will increase left shoulder active flexion to >/= 165 degrees for increased ease reaching.    Baseline 155 today    Time 6    Period Weeks    Status New    Target Date 09/03/21      PT LONG TERM GOAL #3   Title Patient will increase left shoulder active abduction to >/= 165 degrees for increased ease reaching.    Baseline 140 today    Time 6    Period Weeks    Status New    Target Date 09/03/21      PT LONG TERM GOAL #4   Title Pt will be independent in self management of left UE lymphedema    Time 6    Period Weeks    Status New    Target Date 09/03/21      PT LONG TERM GOAL #5   Title Patient will report >/= 50% decrease in pain/tightness for increased tolerance of daily tasks.    Time 6    Period Weeks    Status New  Target Date 09/03/21                   Plan - 08/10/21 1504     Clinical Impression Statement Pt came in complaining of increased pain in left UE when she wears her sleeve over the last week so she has only been wearing her gauntlet.  Pt was remeasured and left UE was  slightly larger, but still smaller than right side.  Had pt try on a size 4 sleeve which was much more comfortable for her. We redid her SOZO and she had an excellent score only 1.9 over the baseline . She still has a small axillary cord and despite doing more stretching at home her shoulder and axillary region is tight and tender.  Will keep an eye on arm for swelling but focus on cording , STM, and ROM    Personal Factors and Comorbidities Comorbidity 3+    Comorbidities Left breast CA functionally triple neg, chemo, radiation    Stability/Clinical Decision Making Stable/Uncomplicated    Rehab Potential Excellent    PT Frequency 2x / week    PT Duration 6 weeks    PT Treatment/Interventions ADLs/Self Care Home Management;Therapeutic exercise;Patient/family education;Passive range of motion;Manual lymph drainage;Manual techniques;Scar mobilization    PT Next Visit Plan STM to pecs and upper quarter, MFR to axillary cord, PROM, continue to monitor for swelling (SOZO today 1.9 over baseline so great right now)    PT Home Exercise Plan sleeve and gauntlet daily unless fingers swell, MLD daily, resume shoulder wand and wall slides for shoulder ROM    Consulted and Agree with Plan of Care Patient             Patient will benefit from skilled therapeutic intervention in order to improve the following deficits and impairments:  Postural dysfunction, Decreased range of motion, Impaired UE functional use, Pain, Decreased knowledge of precautions, Decreased scar mobility, Increased fascial restricitons, Increased edema, Impaired flexibility  Visit Diagnosis: Aftercare following surgery for neoplasm  Stiffness of left shoulder, not elsewhere classified  Abnormal posture  Disorder of the skin and subcutaneous tissue related to radiation, unspecified  Malignant neoplasm of upper-outer quadrant of left breast in female, estrogen receptor positive ([place])  Localized edema     Problem  List Patient Active Problem List   Diagnosis Date Noted   Asthma 07/31/2021   Lymphedema of left arm 02/02/2021   Drug-induced neutropenia ([place]) 09/30/2020   Genetic testing 06/26/2020   Family history of ovarian cancer    Family history of prostate cancer    Malignant neoplasm of upper-outer quadrant of left breast in female, estrogen receptor negative ([place]) 06/03/2020    Jeanne Evans, PT 08/10/2021, 4:17 PM  [facility] [place], [place], [address] Phone: [phone]   Fax:  [phone]  Name: Jeanne Evans MRN: [id number] Date of Birth: 02-05-1986

## 2021-08-13 ENCOUNTER — Encounter: Payer: 59 | Admitting: Physical Therapy

## 2021-08-19 ENCOUNTER — Other Ambulatory Visit: Payer: Self-pay

## 2021-08-19 ENCOUNTER — Ambulatory Visit: Payer: 59 | Attending: Surgery

## 2021-08-19 DIAGNOSIS — L599 Disorder of the skin and subcutaneous tissue related to radiation, unspecified: Secondary | ICD-10-CM | POA: Insufficient documentation

## 2021-08-19 DIAGNOSIS — Z483 Aftercare following surgery for neoplasm: Secondary | ICD-10-CM | POA: Insufficient documentation

## 2021-08-19 DIAGNOSIS — R600 Localized edema: Secondary | ICD-10-CM | POA: Diagnosis present

## 2021-08-19 DIAGNOSIS — R293 Abnormal posture: Secondary | ICD-10-CM | POA: Insufficient documentation

## 2021-08-19 DIAGNOSIS — C50412 Malignant neoplasm of upper-outer quadrant of left female breast: Secondary | ICD-10-CM | POA: Diagnosis present

## 2021-08-19 DIAGNOSIS — Z170 Estrogen receptor positive status [ER+]: Secondary | ICD-10-CM | POA: Insufficient documentation

## 2021-08-19 DIAGNOSIS — M25612 Stiffness of left shoulder, not elsewhere classified: Secondary | ICD-10-CM | POA: Diagnosis present

## 2021-08-19 NOTE — Therapy (Signed)
[facility] [address] [place], [place], [address] Phone:     Fax:     Physical Therapy Treatment  Patient Details  Name: Jeanne Evans MRN: [id number] Date of Birth: 08/25/1986 Referring Provider (PT): Dr. Thomas Cornett   Encounter Date: 08/19/2021   PT End of Session - 08/19/21 1429     Visit Number 3   21 min late   Number of Visits 13    Date for PT Re-Evaluation 09/03/21    PT Start Time 1421    PT Stop Time 1502    PT Time Calculation (min) 41 min    Activity Tolerance Patient tolerated treatment well    Behavior During Therapy WFL for tasks assessed/performed             Past Medical History:  Diagnosis Date   Asthma 07/31/2021   Breast cancer ([facility])    Family history of ovarian cancer    Family history of prostate cancer    GERD (gastroesophageal reflux disease)    History of asthma    has albuterol, uses PRN   History of heart murmur in childhood    History of multiple allergies     Past Surgical History:  Procedure Laterality Date   BREAST LUMPECTOMY WITH RADIOACTIVE SEED AND SENTINEL LYMPH NODE BIOPSY Left 11/19/2020   Procedure: LEFT BREAST LUMPECTOMY X 2 WITH RADIOACTIVE SEED AND SENTINEL LYMPH NODE MAPPING;  Surgeon: Cornett, Thomas, MD;  Location: [facility];  Service: General;  Laterality: Left;  PECTORAL BLOCK   PORTACATH PLACEMENT Right 06/18/2020   Procedure: INSERTION PORT-A-CATH WITH ULTRASOUND GUIDANCE;  Surgeon: Cornett, Thomas, MD;  Location: [facility];  Service: General;  Laterality: Right;   RE-EXCISION OF BREAST LUMPECTOMY Left 12/09/2020   Procedure: RE-EXCISION OF LEFT BREAST LUMPECTOMY;  Surgeon: Cornett, Thomas, MD;  Location: [facility];  Service: General;  Laterality: Left;    There were no vitals filed for this visit.   Subjective Assessment - 08/19/21 1424     Subjective I have been able to wear the other sleeve without  pain but I still have that fatigued feeling. Still have pain and soreness in pectorals/ axillary region on left    Pertinent History Patient was diagnosed on 05/12/2020 with left breast cancer. She underwent neoadjuvant chemotherapy 06/19/2020-09/23/2020 and had a left lumpectomy and sentinel node biopsy (6 negative nodes) on 11/19/2020.  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 Numbness in posterior arm is a little better.    Patient Stated Goals Decrease pain and improve shoulder ROM, decrease swelling    Currently in Pain? No/denies   Discomfort not pain.   Pain Score 0-No pain                               [facility] Adult PT Treatment/Exercise - 08/19/21 0001       Manual Therapy   Soft tissue mobilization to left pectorals, lats in supine with stargazer position and left medial and lateral upper arm    Myofascial Release MFR to left axillary region, arm and forearm area of cording    Passive ROM PROM left shoulder flex, scaption, abd, ER with MFR during stretches                         PT Long Term Goals - 07/23/21   [place] #1   Title Patient will demonstrate independence in HEP   and that she has regained full function post operatively compared to baselines.    Time 6    Period Weeks    Status New    Target Date 09/03/21      PT LONG TERM GOAL #2   Title Patient will increase left shoulder active flexion to >/= 165 degrees for increased ease reaching.    Baseline 155 today    Time 6    Period Weeks    Status New    Target Date 09/03/21      PT LONG TERM GOAL #3   Title Patient will increase left shoulder active abduction to >/= 165 degrees for increased ease reaching.    Baseline 140 today    Time 6    Period Weeks    Status New    Target Date 09/03/21      PT LONG TERM GOAL #4   Title Pt will be independent in self management of left UE lymphedema    Time 6    Period Weeks    Status New    Target Date  09/03/21      PT LONG TERM GOAL #5   Title Patient will report >/= 50% decrease in pain/tightness for increased tolerance of daily tasks.    Time 6    Period Weeks    Status New    Target Date 09/03/21                   Plan - 08/19/21 1634     Clinical Impression Statement Pt is tolerating the larger size sleeve better without increased pain, but her arm still feels fatigued after she wears it for a period of time.  Sometimes she wears it at work and then removes for several hours.  She continues to be bothered by cording and the pectoralis muscle is very tight today.  Pt felt much looser after treatment today    Personal Factors and Comorbidities Comorbidity 3+    Comorbidities Left breast CA functionally triple neg, chemo, radiation    Stability/Clinical Decision Making Stable/Uncomplicated    Rehab Potential Excellent    PT Frequency 2x / week    PT Duration 6 weeks    PT Treatment/Interventions ADLs/Self Care Home Management;Therapeutic exercise;Patient/family education;Passive range of motion;Manual lymph drainage;Manual techniques;Scar mobilization    PT Next Visit Plan STM to pecs and upper quarter, MFR to axillary cord, PROM, continue to monitor for swelling (SOZO today 1.9 over baseline so great right now), MLD prn, pwer plate stretch for pecs    PT Home Exercise Plan sleeve and gauntlet daily unless fingers swell, MLD daily, resume shoulder wand and wall slides for shoulder ROM    Consulted and Agree with Plan of Care Patient             Patient will benefit from skilled therapeutic intervention in order to improve the following deficits and impairments:  Postural dysfunction, Decreased range of motion, Impaired UE functional use, Pain, Decreased knowledge of precautions, Decreased scar mobility, Increased fascial restricitons, Increased edema, Impaired flexibility  Visit Diagnosis: Aftercare following surgery for neoplasm  Stiffness of left shoulder, not  elsewhere classified  Abnormal posture  Disorder of the skin and subcutaneous tissue related to radiation, unspecified  Malignant neoplasm of upper-outer quadrant of left breast in female, estrogen receptor positive ([facility])  Localized edema  Problem List Patient Active Problem List   Diagnosis Date Noted   Asthma 07/31/2021   Lymphedema of left arm 02/02/2021   Drug-induced neutropenia ([facility]) 09/30/2020   Genetic testing 06/26/2020   Family history of ovarian cancer    Family history of prostate cancer    Malignant neoplasm of upper-outer quadrant of left breast in female, estrogen receptor negative ([place]) 06/03/2020    Claris Pong, PT 08/19/2021, 4:39 PM  [facility] [place], [place], [address] Phone:     Fax:     Name: VASSIE KUGEL MRN: [id number] Date of Birth: [birth year]

## 2021-08-24 ENCOUNTER — Ambulatory Visit: Payer: 59

## 2021-08-25 ENCOUNTER — Other Ambulatory Visit: Payer: Self-pay | Admitting: *Deleted

## 2021-08-25 DIAGNOSIS — Z171 Estrogen receptor negative status [ER-]: Secondary | ICD-10-CM

## 2021-08-25 DIAGNOSIS — C50412 Malignant neoplasm of upper-outer quadrant of left female breast: Secondary | ICD-10-CM

## 2021-08-26 ENCOUNTER — Other Ambulatory Visit: Payer: Self-pay

## 2021-08-26 ENCOUNTER — Ambulatory Visit: Payer: 59

## 2021-08-26 DIAGNOSIS — M25612 Stiffness of left shoulder, not elsewhere classified: Secondary | ICD-10-CM

## 2021-08-26 DIAGNOSIS — R293 Abnormal posture: Secondary | ICD-10-CM

## 2021-08-26 DIAGNOSIS — L599 Disorder of the skin and subcutaneous tissue related to radiation, unspecified: Secondary | ICD-10-CM

## 2021-08-26 DIAGNOSIS — R600 Localized edema: Secondary | ICD-10-CM

## 2021-08-26 DIAGNOSIS — Z483 Aftercare following surgery for neoplasm: Secondary | ICD-10-CM | POA: Diagnosis not present

## 2021-08-26 NOTE — Therapy (Signed)
[facility] @ [place], [place], [address] Phone: [phone]   Fax:  [phone]  Physical Therapy Treatment  Patient Details  Name: Jeanne Evans MRN: [id number] Date of Birth: [birth year] Referring Provider (PT): Dr. Erroll Luna   Encounter Date: 08/26/2021   PT End of Session - 08/26/21 1122     Visit Number 4    Number of Visits 13    Date for PT Re-Evaluation 09/03/21    PT Start Time 1119    PT Stop Time 1155    PT Time Calculation (min) 36 min    Activity Tolerance Patient tolerated treatment well    Behavior During Therapy [facility] for tasks assessed/performed             Past Medical History:  Diagnosis Date   Asthma 07/31/2021   Breast cancer [facility])    Family history of ovarian cancer    Family history of prostate cancer    GERD (gastroesophageal reflux disease)    History of asthma    has albuterol, uses PRN   History of heart murmur in childhood    History of multiple allergies     Past Surgical History:  Procedure Laterality Date   BREAST LUMPECTOMY WITH RADIOACTIVE SEED AND SENTINEL LYMPH NODE BIOPSY Left 11/19/2020   Procedure: LEFT BREAST LUMPECTOMY X 2 WITH RADIOACTIVE SEED AND SENTINEL LYMPH NODE MAPPING;  Surgeon: Erroll Luna, MD;  Location: [place];  Service: General;  Laterality: Left;  PECTORAL BLOCK   PORTACATH PLACEMENT Right 06/18/2020   Procedure: INSERTION PORT-A-CATH WITH ULTRASOUND GUIDANCE;  Surgeon: Erroll Luna, MD;  Location: [place];  Service: General;  Laterality: Right;   RE-EXCISION OF BREAST LUMPECTOMY Left 12/09/2020   Procedure: RE-EXCISION OF LEFT BREAST LUMPECTOMY;  Surgeon: Erroll Luna, MD;  Location: [place];  Service: General;  Laterality: Left;    There were no vitals filed for this visit.   Subjective Assessment - 08/26/21 1119     Subjective 17 min late then had to use the  restroom. Very tired today. Scheduled for a right breast reduction in December.I am going on a cruise at the end of October so I have to cancel a few appts from Oct 23-29.    Pertinent History Patient was diagnosed on 05/12/2020 with left breast cancer. She underwent neoadjuvant chemotherapy 06/19/2020-09/23/2020 and had a left lumpectomy and sentinel node biopsy (6 negative nodes) on 11/19/2020.  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 Numbness in posterior arm is a little better.    Patient Stated Goals Decrease pain and improve shoulder ROM, decrease swelling    Currently in Pain? No/denies    Pain Score 0-No pain    Multiple Pain Sites No                               [facility] Adult PT Treatment/Exercise - 08/26/21 0001       Shoulder Exercises: Supine   Other Supine Exercises Supine AROM flexion, scaption, horizontal abd 2 x5 reps      Shoulder Exercises: Standing   Other Standing Exercises Elbow flexion bilaterally 3 # x 10      Manual Therapy   Soft tissue mobilization to left pectorals, lats in supine with stargazer position    Myofascial Release MFR to left axillary region, arm and forearm area of cording  Passive ROM PROM left shoulder flex, scaption, abd, ER                          PT Long Term Goals - 07/23/21 1527       PT LONG TERM GOAL #1   Title Patient will demonstrate independence in HEP   and that she has regained full function post operatively compared to baselines.    Time 6    Period Weeks    Status New    Target Date 09/03/21      PT LONG TERM GOAL #2   Title Patient will increase left shoulder active flexion to >/= 165 degrees for increased ease reaching.    Baseline 155 today    Time 6    Period Weeks    Status New    Target Date 09/03/21      PT LONG TERM GOAL #3   Title Patient will increase left shoulder active abduction to >/= 165 degrees for increased ease reaching.    Baseline 140 today    Time 6     Period Weeks    Status New    Target Date 09/03/21      PT LONG TERM GOAL #4   Title Pt will be independent in self management of left UE lymphedema    Time 6    Period Weeks    Status New    Target Date 09/03/21      PT LONG TERM GOAL #5   Title Patient will report >/= 50% decrease in pain/tightness for increased tolerance of daily tasks.    Time 6    Period Weeks    Status New    Target Date 09/03/21                   Plan - 08/26/21 1141     Clinical Impression Statement Continued soft tissue mobilization to pecs and lats, MFR to cording which is greatly improved, and PROM to left shoulder. Pt continues with tightness in axillary border of pecs.  She fatigued easily with 3 # wt for elbow flexion.  Overall pain is better and she is tolerating the compression sleeve now without increased pain. Her arm continues to fatigue easily with work and home activities    Personal Factors and Comorbidities Comorbidity 3+    Comorbidities Left breast CA functionally triple neg, chemo, radiation    Stability/Clinical Decision Making Stable/Uncomplicated    Rehab Potential Excellent    PT Frequency 2x / week    PT Duration 6 weeks    PT Treatment/Interventions ADLs/Self Care Home Management;Therapeutic exercise;Patient/family education;Passive range of motion;Manual lymph drainage;Manual techniques;Scar mobilization    PT Next Visit Plan Check goals,STM to pecs and upper quarter, MFR to axillary cord, PROM, continue to monitor for swelling (SOZO today 1.9 over baseline so great right now), MLD prn, pwer plate stretch for pecs    PT Home Exercise Plan sleeve and gauntlet daily unless fingers swell, MLD daily, resume shoulder wand and wall slides for shoulder ROM    Consulted and Agree with Plan of Care Patient             Patient will benefit from skilled therapeutic intervention in order to improve the following deficits and impairments:  Postural dysfunction, Decreased range  of motion, Impaired UE functional use, Pain, Decreased knowledge of precautions, Decreased scar mobility, Increased fascial restricitons, Increased edema, Impaired flexibility  Visit Diagnosis: Aftercare following surgery  for neoplasm  Stiffness of left shoulder, not elsewhere classified  Abnormal posture  Disorder of the skin and subcutaneous tissue related to radiation, unspecified  Localized edema     Problem List Patient Active Problem List   Diagnosis Date Noted   Asthma 07/31/2021   Lymphedema of left arm 02/02/2021   Drug-induced neutropenia (HCC) 09/30/2020   Genetic testing 06/26/2020   Family history of ovarian cancer    Family history of prostate cancer    Malignant neoplasm of upper-outer quadrant of left breast in female, estrogen receptor negative ([place]) 06/03/2020    Claris Pong, PT 08/26/2021, 12:00 PM  [place] [facility] @ [place], [place], [address] Phone: [phone]   Fax:  [phone]  Name: Jeanne Evans MRN: [id number] Date of Birth: 01/08/1986

## 2021-08-26 NOTE — Progress Notes (Signed)
[place]  Telephone:([phone] Fax:(336) [phone]     ID: Jeanne Evans DOB: 22-Jan-1986  MR#: [id number]  QBV#:[id number]  Patient Care Team: [name], MD as PCP - General (Obstetrics and Gynecology) Rockwell Germany, RN as Oncology Nurse Navigator Mauro Kaufmann, RN as Oncology Nurse Navigator Erroll Luna, MD as Consulting Physician (General Surgery) Kingstyn Deruiter, Virgie Dad, MD as Consulting Physician (Oncology) Kyung Rudd, MD as Consulting Physician (Radiation Oncology) Servando Salina, MD as Consulting Physician (Obstetrics and Gynecology) Brand Males, MD as Consulting Physician (Pulmonary Disease) Chauncey Cruel, MD OTHER MD:  CHIEF COMPLAINT: 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   She received two doses of Pembrolizumab on 04/16/2021 and 05/09/2021.  This was on hold since due to cough.  She was evaluated by Dr. Chase Caller and her cough was attributed to asthma, not pembrolizumab.  She began treatment with inhalers and singulair and her cough has significantly improved.  We have resumed the pembrolizumab.  We requested PD-L1 testing on her 11/19/2020 pathology sample. Results showed a combined positive score of 10 (results in "Media" tab).  This is very favorable.  She restarted Pembrolizumab on 08/06/2021.  Molecular studies on her tumor have not shown any actionable mutations  Most recent mammography was 05/05/2021 with a breast density category B and no evidence of malignancy.   REVIEW OF SYSTEMS: Jeanne Evans continues to have pain in the left breast and also left axilla and left chest wall.  Sometimes she has shooting pains as well as soreness and discomfort.  She is getting physical therapy for this.  She is working 6 to 8 hours now and finds it very difficult.  She is also  continuing to have problems with hot flashes.  She is already on gabapentin and venlafaxine for this.  She is planning to go on a cruise 09/06/2021 and 11/172022.  The latter 1 conflicts with her treatment dates and we are making those changes today.  COVID 19 VACCINATION STATUS: [place] x2, most recently 01/2021   HISTORY OF CURRENT ILLNESS: From the original intake note:  Jeanne Evans herself palpated a painful left breast lump. She underwent bilateral diagnostic mammography with tomography and left breast ultrasonography at [facility] on 05/12/2020 showing: breast density category B; palpable 2.7 cm oval hypoechoic mass in left breast at 2-3 o'clock; 2.2 cm left axillary lymph node/grouping of nodes.  Accordingly on 05/29/2020 she proceeded to biopsy of the left breast area in question. The pathology from this procedure ([id number]) showed: invasive ductal carcinoma, grade 3. Prognostic indicators significant for: estrogen receptor, 10% positive with weak staining intensity and progesterone receptor, 0% negative. Proliferation marker Ki67 at 95%. HER2 equivocal by immunohistochemistry (2+), but negative by fluorescent in situ hybridization with a signals ratio 1.77 and number per cell 2.65.  Biopsy of the left axillary lymph node in question was negative and concordant  The patient's subsequent history is as detailed below.   PAST MEDICAL HISTORY: Past Medical History:  Diagnosis Date   Asthma 07/31/2021   Breast cancer ([place])    Family history of ovarian cancer    Family history of prostate cancer    GERD (gastroesophageal reflux disease)    History of asthma    has albuterol, uses PRN   History of heart murmur in childhood    History of multiple allergies   History of  allergy related headaches   PAST SURGICAL HISTORY: Past Surgical History:  Procedure Laterality Date   BREAST LUMPECTOMY WITH RADIOACTIVE SEED AND SENTINEL LYMPH NODE BIOPSY Left 11/19/2020    Procedure: LEFT BREAST LUMPECTOMY X 2 WITH RADIOACTIVE SEED AND SENTINEL LYMPH NODE MAPPING;  Surgeon: Erroll Luna, MD;  Location: [facility];  Service: General;  Laterality: Left;  PECTORAL BLOCK   PORTACATH PLACEMENT Right 06/18/2020   Procedure: INSERTION PORT-A-CATH WITH ULTRASOUND GUIDANCE;  Surgeon: Erroll Luna, MD;  Location: [facility];  Service: General;  Laterality: Right;   RE-EXCISION OF BREAST LUMPECTOMY Left 12/09/2020   Procedure: RE-EXCISION OF LEFT BREAST LUMPECTOMY;  Surgeon: Erroll Luna, MD;  Location: [facility];  Service: General;  Laterality: Left;  She reports having a boil lanced from her perineal area   FAMILY HISTORY: Family History  Problem Relation Age of Onset   Lung cancer Maternal Uncle        dx late 40s   Prostate cancer Paternal Uncle        dx late 48s   Ovarian cancer Paternal Grandmother        dx 48s   Prostate cancer Paternal Grandfather        dx 25s   Prostate cancer Paternal Uncle        dx early 83s  The patient's father is 32 and her mother 35, as of 05/2020.  The patient has two brothers and one sister. She reports ovarian cancer in her paternal grandmother, prostate cancer in her paternal grandfather and a paternal uncle, and lung cancer in a maternal uncle.   GYNECOLOGIC HISTORY:  No LMP recorded. (Menstrual status: IUD). Menarche: 35 years old Age at first live birth: 35 years old [place] P 2 LMP current, experiences spotting Contraceptive: Mirena IUD in place HRT n/a  Hysterectomy? no BSO? 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 She describes herself as single. She lives at home with her children, Jeanne Evans (age 70) and Jeanne Evans (age 34).  She is originally from [place] her home.    ADVANCED DIRECTIVES: Not in place. She intends to name her mother,  Jeanne Evans, as her HCPOA.  Jeanne Evans lives in [place] and can be reached at [phone].  The patient was given the appropriate documents to complete and notarized at her discretion at the time of her visit 06/04/2020   HEALTH MAINTENANCE: Social History   Tobacco Use   Smoking status: Never   Smokeless tobacco: Never  Substance Use Topics   Alcohol use: Yes    Comment: occas   Drug use: Yes    Types: Marijuana     Colonoscopy: n/a (age)  PAP: 04/2020  Bone density: n/a (age)   Allergies  Allergen Reactions   Sulfa Antibiotics Hives    Current Outpatient Medications  Medication Sig Dispense Refill   Acetaminophen (TYLENOL) 325 MG CAPS Take by mouth. (Patient not taking: Reported on 07/31/2021)     albuterol (VENTOLIN HFA) 108 (90 Base) MCG/ACT inhaler Inhale 2 puffs into the lungs every 6 (six) hours as needed for wheezing or shortness of breath. 8 g 6   fluticasone furoate-vilanterol (BREO ELLIPTA) 100-25 MCG/INH AEPB Inhale 1 puff into the lungs daily. 60 each 5   gabapentin (NEURONTIN) 300 MG capsule Take 1 capsule (300 mg total) by mouth at bedtime. 90 capsule 4   ibuprofen (ADVIL) 800  MG tablet Take 1 tablet (800 mg total) by mouth every 8 (eight) hours as needed. (Patient not taking: Reported on 07/31/2021) 30 tablet 0   montelukast (SINGULAIR) 10 MG tablet Take 1 tablet (10 mg total) by mouth at bedtime. 30 tablet 11   omeprazole (PRILOSEC) 40 MG capsule Take 1 capsule (40 mg total) by mouth at bedtime. 30 capsule 3   promethazine-codeine (PHENERGAN WITH CODEINE) 6.25-10 MG/5ML syrup Take 5 mLs by mouth every 8 (eight) hours as needed for cough. (Patient not taking: Reported on 07/31/2021) 120 mL 0   valACYclovir (VALTREX) 500 MG tablet Take 500 mg by mouth 2 (two) times daily.     venlafaxine XR (EFFEXOR-XR) 75 MG 24 hr capsule Take 1 capsule (75 mg total) by mouth daily with breakfast. 90 capsule 4   No current facility-administered medications for this visit.     OBJECTIVE: African-American woman who appears stated age 1:   08/27/21 1022  BP: (!) 143/89  Pulse: 67  Resp: 16  Temp: 97.6 F (36.4 C)  SpO2: 100%      Body mass index is 32.77 kg/m.   Wt Readings from Last 3 Encounters:  08/27/21 185 lb (83.9 kg)  08/06/21 185 lb 6.4 oz (84.1 kg)  07/30/21 185 lb (83.9 kg)  ECOG FS:1 - Symptomatic but completely ambulatory  Sclerae unicteric, EOMs intact Wearing a mask No cervical or supraclavicular adenopathy Lungs no rales or rhonchi Heart regular rate and rhythm Abd soft, nontender, positive bowel sounds MSK no focal spinal tenderness, no upper extremity lymphedema Neuro: nonfocal, well oriented, appropriate affect Breasts: The right breast is benign per the left breast is status postlumpectomy and radiation.  There are the expected treatment changes but no evidence of local recurrence.  Both axillae are benign.   LAB RESULTS:  CMP     Component Value Date/Time   NA 139 08/06/2021 0902   K 4.0 08/06/2021 0902   CL 106 08/06/2021 0902   CO2 25 08/06/2021 0902   GLUCOSE 91 08/06/2021 0902   BUN 10 08/06/2021 0902   CREATININE 0.84 08/06/2021 0902   CALCIUM 9.7 08/06/2021 0902   PROT 7.9 08/06/2021 0902   ALBUMIN 4.3 08/06/2021 0902   AST 18 08/06/2021 0902   ALT 23 08/06/2021 0902   ALKPHOS 86 08/06/2021 0902   BILITOT 0.7 08/06/2021 0902   GFRNONAA >60 08/06/2021 0902   GFRAA >60 08/05/2020 0955   GFRAA >60 06/04/2020 0830    No results found for: TOTALPROTELP, ALBUMINELP, A1GS, A2GS, BETS, BETA2SER, GAMS, MSPIKE, SPEI  Lab Results  Component Value Date   WBC 4.2 08/27/2021   NEUTROABS 1.6 (L) 08/27/2021   HGB 12.6 08/27/2021   HCT 37.0 08/27/2021   MCV 85.8 08/27/2021   PLT 250 08/27/2021    No results found for: LABCA2  No components found for: QPYPPJ093  No results for input(s): INR in the last 168 hours.  No results found for: LABCA2  No results found for: OIZ124  No results found for:  PYK998  No results found for: PJA250  No results found for: CA2729  No components found for: HGQUANT  No results found for: CEA1 / No results found for: CEA1   No results found for: AFPTUMOR  No results found for: CHROMOGRNA  No results found for: KPAFRELGTCHN, LAMBDASER, KAPLAMBRATIO (kappa/lambda light chains)  No results found for: HGBA, HGBA2QUANT, HGBFQUANT, HGBSQUAN (Hemoglobinopathy evaluation)   No results found for: LDH  No results found for: IRON, TIBC, IRONPCTSAT (Iron  and TIBC)  No results found for: FERRITIN  Urinalysis No results found for: COLORURINE, APPEARANCEUR, LABSPEC, PHURINE, GLUCOSEU, HGBUR, BILIRUBINUR, KETONESUR, PROTEINUR, UROBILINOGEN, NITRITE, LEUKOCYTESUR   STUDIES: ECHOCARDIOGRAM COMPLETE  Result Date: 07/28/2021    ECHOCARDIOGRAM REPORT   Patient Name:   NGOZI ALVIDREZ Date of Exam: 07/28/2021 Medical Rec #:  [id number]          Height:       62.5 in Accession #:    [id number]         Weight:       187.2 lb Date of Birth:  10-19-1986         BSA:          1.870 m Patient Age:    34 years           BP:           129/85 mmHg Patient Gender: F                  HR:           82 bpm. Exam Location:  [place] [place] [facility] Procedure: 2D Echo, Cardiac Doppler, Color Doppler, 3D Echo and Strain Analysis Indications:    R06.02 Shortness of breath  History:        Patient has prior history of Echocardiogram examinations, most                 recent 06/13/2020. Breast cancer.  Sonographer:    Basilia Jumbo BS, RDCS Referring Phys: [phone] [place]  1. Left ventricular ejection fraction, by estimation, is 65 to 70%. Left ventricular ejection fraction by 3D volume is 64 %. The left ventricle has normal function. The left ventricle has no regional wall motion abnormalities. Left ventricular diastolic  parameters were normal. The average left ventricular global longitudinal strain is -27.2 %. The global longitudinal strain is normal.  2. Right  ventricular systolic function is normal. The right ventricular size is normal. Tricuspid regurgitation signal is inadequate for assessing PA pressure.  3. The mitral valve is normal in structure. No evidence of mitral valve regurgitation. No evidence of mitral stenosis.  4. The aortic valve is tricuspid. Aortic valve regurgitation is not visualized. No aortic stenosis is present.  5. The inferior vena cava is normal in size with greater than 50% respiratory variability, suggesting right atrial pressure of 3 mmHg. Comparison(s): A prior study was performed on 06/13/20. 06/13/20 EF 60-65%. GLS -21.5%. FINDINGS  Left Ventricle: Left ventricular ejection fraction, by estimation, is 65 to 70%. Left ventricular ejection fraction by 3D volume is 64 %. The left ventricle has normal function. The left ventricle has no regional wall motion abnormalities. The average left ventricular global longitudinal strain is -27.2 %. The global longitudinal strain is normal. The left ventricular internal cavity size was normal in size. There is no left ventricular hypertrophy. Left ventricular diastolic parameters were normal. Right Ventricle: The right ventricular size is normal. No increase in right ventricular wall thickness. Right ventricular systolic function is normal. Tricuspid regurgitation signal is inadequate for assessing PA pressure. Left Atrium: Left atrial size was normal in size. Right Atrium: Right atrial size was normal in size. Pericardium: There is no evidence of pericardial effusion. Mitral Valve: The mitral valve is normal in structure. No evidence of mitral valve regurgitation. No evidence of mitral valve stenosis. Tricuspid Valve: The tricuspid valve is normal in structure. Tricuspid valve regurgitation is not demonstrated. No evidence of tricuspid stenosis. Aortic Valve: The  aortic valve is tricuspid. Aortic valve regurgitation is not visualized. No aortic stenosis is present. Pulmonic Valve: The pulmonic valve was  normal in structure. Pulmonic valve regurgitation is not visualized. No evidence of pulmonic stenosis. Aorta: The aortic root and ascending aorta are structurally normal, with no evidence of dilitation. Venous: The inferior vena cava is normal in size with greater than 50% respiratory variability, suggesting right atrial pressure of 3 mmHg. IAS/Shunts: The atrial septum is grossly normal.  LEFT VENTRICLE PLAX 2D LVIDd:         4.40 cm         Diastology LVIDs:         2.90 cm         LV e' medial:    14.10 cm/s LV PW:         0.80 cm         LV E/e' medial:  6.3 LV IVS:        0.80 cm         LV e' lateral:   17.40 cm/s LVOT diam:     1.90 cm         LV E/e' lateral: 5.1 LV SV:         70 LV SV Index:   38              2D LVOT Area:     2.84 cm        Longitudinal                                Strain                                2D Strain GLS  -31.5 %                                (A2C):                                2D Strain GLS  -23.4 %                                (A3C):                                2D Strain GLS  -26.7 %                                (A4C):                                2D Strain GLS  -27.2 %                                Avg:                                 3D Volume EF  LV 3D EF:    Left                                             ventricul                                             ar                                             ejection                                             fraction                                             by 3D                                             volume is                                             64 %.                                 3D Volume EF:                                3D EF:        64 %                                LV EDV:       105 ml                                LV ESV:       38 ml                                LV SV:        67 ml RIGHT VENTRICLE RV Basal diam:  3.10 cm RV S prime:     15.50  cm/s TAPSE (M-mode): 2.5 cm LEFT ATRIUM             Index       RIGHT ATRIUM           Index LA diam:        3.20 cm 1.71 cm/m  RA Pressure: 3.00 mmHg  LA Vol (A2C):   37.3 ml 19.95 ml/m RA Area:     14.00 cm LA Vol (A4C):   28.5 ml 15.24 ml/m RA Volume:   34.10 ml  18.24 ml/m LA Biplane Vol: 35.9 ml 19.20 ml/m  AORTIC VALVE LVOT Vmax:   139.00 cm/s LVOT Vmean:  96.900 cm/s LVOT VTI:    0.248 m  AORTA Ao Root diam: 2.50 cm Ao Asc diam:  2.50 cm MV E velocity: 88.60 cm/s  TRICUSPID VALVE MV A velocity: 71.30 cm/s  Estimated RAP:  3.00 mmHg MV E/A ratio:  1.24                            SHUNTS                            Systemic VTI:  0.25 m                            Systemic Diam: 1.90 cm Rudean Haskell MD Electronically signed by Rudean Haskell MD Signature Date/Time: 07/28/2021/3:46:32 PM    Final      ELIGIBLE FOR AVAILABLE RESEARCH PROTOCOL: no  ASSESSMENT: 35 y.o. [place] woman status post left breast upper outer quadrant biopsy 05/29/2020 for a clinical T2 N0, stage IIB invasive ductal carcinoma, functionally triple negative, with an MIB-1 of 95%.  (1) genetics testing 06/23/2020 through the [facility] Multi-Cancer Panel found no deleterious mutations in AIP, ALK, APC, ATM, AXIN2,BAP1,  BARD1, BLM, BMPR1A, BRCA1, BRCA2, BRIP1, CASR, CDC73, CDH1, CDK4, CDKN1B, CDKN1C, CDKN2A (p14ARF), CDKN2A (p16INK4a), CEBPA, CHEK2, CTNNA1, DICER1, DIS3L2, EGFR (c.2369C>T, p.Thr790Met variant only), EPCAM (Deletion/duplication testing only), FH, FLCN, GATA2, GPC3, GREM1 (Promoter region deletion/duplication testing only), HOXB13 (c.251G>A, p.Gly84Glu), HRAS, KIT, MAX, MEN1, MET, MITF (c.952G>A, p.Glu318Lys variant only), MLH1, MSH2, MSH3, MSH6, MUTYH, NBN, NF1, NF2, NTHL1, PALB2, PDGFRA, PHOX2B, PMS2, POLD1, POLE, POT1, PRKAR1A, PTCH1, PTEN, RAD50, RAD51C, RAD51D, RB1, RECQL4, RET, RNF43, RUNX1, SDHAF2, SDHA (sequence changes only), SDHB, SDHC, SDHD, SMAD4, SMARCA4, SMARCB1, SMARCE1, STK11, SUFU, TERC,  TERT, TMEM127, TP53, TSC1, TSC2, VHL, WRN and WT1.   (2) neoadjuvant chemotherapy consisting of cyclophosphamide and doxorubicin in dose dense fashion x4 started 06/19/2020, completed 08/05/2020, followed by paclitaxel and carboplatin weekly x12 starting 08/26/2020, completing 4 doses 09/23/2020  (a) echocardiogram on 06/13/2020 showed an EF of 60-65%  (b) paclitaxel and carboplatin discontinued after 4 doses because of neuropathy and cytopenias  (3) status post left lumpectomy and sentinel lymph node sampling 11/19/2020 for a residual yp T1c ypN0 invasive ductal carcinoma involving the superior margin  (a) additional surgery 12/09/2020 cleared the margin in question  (b) repeat prognostic panel confirms triple negative disease  (4) adjuvant radiation : 01/22/2021 through 03/10/2021 Site Technique Total Dose (Gy) Dose per Fx (Gy) Completed Fx Beam Energies  Breast, Left: Breast_Lt 3D 50.4/50.4 1.8 28/28 6X  Breast, Left: Breast_Lt_Bst 3D 10/10 2 5/5 6X   (a) received capecitabine sensitization, started 01/26/2021  (5) pembrolizumab/Keytruda began on 04/16/2021, held 05/11/2021 (after 2 doses) with persistent cough, later attributed to asthma (A) pembrolizumab resumed 08/06/2021 (B) PD-L1 combined score is 10  (6) molecular pathology from the 11/19/2020 sample showed a mutation in [id number].  There were no mutations in BRCA1 or 2, ER BB 2 or PIK 3 CA.  The microsatellite status was equivocal and the tumor mutational burden could not be determined on the sample.  PLAN: Jeanne Evans is tolerating pembrolizumab well.  I reviewed the results of the PD-L1 testing for her and these do predict a very good response.  The plan is to continue that to a total of 1 year.  She is having difficulty reentering normalcy.  I am hopeful with continuing physical therapy she will obtain a comfortable new normal for her.  Because of the cruises she is going to be going on we are moving the treatment date from November  25 to 12/ 01.  I will see her with her later December treatment  Total encounter time 25 minutes.Sarajane Jews C. Demaree Liberto, MD 08/27/21 10:41 AM Medical Oncology and Hematology [facility] [place], [place] [address] Tel. [phone]    Fax. [phone]   I, Wilburn Mylar, am acting as scribe for Dr. Virgie Dad. Shayanna Thatch.  I, Lurline Del MD, have reviewed the above documentation for accuracy and completeness, and I agree with the above.    *Total Encounter Time as defined by the Centers for Medicare and Medicaid Services includes, in addition to the face-to-face time of a patient visit (documented in the note above) non-face-to-face time: obtaining and reviewing outside history, ordering and reviewing medications, tests or procedures, care coordination (communications with other health care professionals or caregivers) and documentation in the medical record.

## 2021-08-27 ENCOUNTER — Inpatient Hospital Stay: Payer: 59

## 2021-08-27 ENCOUNTER — Inpatient Hospital Stay: Payer: 59 | Attending: Oncology | Admitting: Oncology

## 2021-08-27 VITALS — BP 143/89 | HR 67 | Temp 97.6°F | Resp 16 | Ht 63.0 in | Wt 185.0 lb

## 2021-08-27 DIAGNOSIS — Z5112 Encounter for antineoplastic immunotherapy: Secondary | ICD-10-CM | POA: Diagnosis not present

## 2021-08-27 DIAGNOSIS — C50412 Malignant neoplasm of upper-outer quadrant of left female breast: Secondary | ICD-10-CM | POA: Diagnosis not present

## 2021-08-27 DIAGNOSIS — Z171 Estrogen receptor negative status [ER-]: Secondary | ICD-10-CM

## 2021-08-27 DIAGNOSIS — Z95828 Presence of other vascular implants and grafts: Secondary | ICD-10-CM

## 2021-08-27 LAB — CMP (CANCER CENTER ONLY)
ALT: 18 U/L (ref 0–44)
AST: 17 U/L (ref 15–41)
Albumin: 4.3 g/dL (ref 3.5–5.0)
Alkaline Phosphatase: 75 U/L (ref 38–126)
Anion gap: 5 (ref 5–15)
BUN: 11 mg/dL (ref 6–20)
CO2: 28 mmol/L (ref 22–32)
Calcium: 9.3 mg/dL (ref 8.9–10.3)
Chloride: 103 mmol/L (ref 98–111)
Creatinine: 0.76 mg/dL (ref 0.44–1.00)
GFR, Estimated: >60 mL/min (ref >60)
Glucose, Bld: 94 mg/dL (ref 70–99)
Potassium: 3.8 mmol/L (ref 3.5–5.1)
Sodium: 136 mmol/L (ref 135–145)
Total Bilirubin: 1.1 mg/dL (ref 0.3–1.2)
Total Protein: 7.9 g/dL (ref 6.5–8.1)

## 2021-08-27 LAB — TSH: TSH: 0.638 u[IU]/mL (ref 0.308–3.960)

## 2021-08-27 LAB — CBC WITH DIFFERENTIAL (CANCER CENTER ONLY)
Abs Immature Granulocytes: 0 10*3/uL (ref 0.00–0.07)
Basophils Absolute: 0 10*3/uL (ref 0.0–0.1)
Basophils Relative: 1 %
Eosinophils Absolute: 0.4 10*3/uL (ref 0.0–0.5)
Eosinophils Relative: 9 %
HCT: 37 % (ref 36.0–46.0)
Hemoglobin: 12.6 g/dL (ref 12.0–15.0)
Immature Granulocytes: 0 %
Lymphocytes Relative: 43 %
Lymphs Abs: 1.9 10*3/uL (ref 0.7–4.0)
MCH: 29.2 pg (ref 26.0–34.0)
MCHC: 34.1 g/dL (ref 30.0–36.0)
MCV: 85.8 fL (ref 80.0–100.0)
Monocytes Absolute: 0.4 10*3/uL (ref 0.1–1.0)
Monocytes Relative: 10 %
Neutro Abs: 1.6 10*3/uL — ABNORMAL LOW (ref 1.7–7.7)
Neutrophils Relative %: 37 %
Platelet Count: 250 10*3/uL (ref 150–400)
RBC: 4.31 MIL/uL (ref 3.87–5.11)
RDW: 14.3 % (ref 11.5–15.5)
WBC Count: 4.2 10*3/uL (ref 4.0–10.5)
nRBC: 0 % (ref 0.0–0.2)

## 2021-08-27 MED ORDER — SODIUM CHLORIDE 0.9 % IV SOLN: Freq: Once | INTRAVENOUS | Status: AC

## 2021-08-27 MED ORDER — SODIUM CHLORIDE 0.9% FLUSH
10.0000 mL | INTRAVENOUS | Status: DC | PRN
  Administered 2021-08-27: 10 mL

## 2021-08-27 MED ORDER — VENLAFAXINE HCL ER 37.5 MG PO CP24: 37.5000 mg | ORAL_CAPSULE | Freq: Every day | ORAL | 4 refills | Status: DC

## 2021-08-27 MED ORDER — SODIUM CHLORIDE 0.9% FLUSH
10.0000 mL | INTRAVENOUS | Status: DC | PRN
  Administered 2021-08-27: 10 mL via INTRAVENOUS

## 2021-08-27 MED ORDER — SODIUM CHLORIDE 0.9 % IV SOLN
200.0000 mg | Freq: Once | INTRAVENOUS | Status: AC
  Administered 2021-08-27: 200 mg via INTRAVENOUS
  Filled 2021-08-27: qty 8

## 2021-08-27 MED ORDER — HEPARIN SOD (PORK) LOCK FLUSH 100 UNIT/ML IV SOLN
500.0000 [IU] | Freq: Once | INTRAVENOUS | Status: AC | PRN
  Administered 2021-08-27: 500 [IU]

## 2021-08-27 NOTE — Patient Instructions (Signed)
[facility] MEDICAL ONCOLOGY  Discharge Instructions: Thank you for choosing [facility] to provide your oncology and hematology care.   If you have a lab appointment with the Cancer Center, please go directly to the Cancer Center and check in at the registration area.   Wear comfortable clothing and clothing appropriate for easy access to any Portacath or PICC line.   We strive to give you quality time with your provider. You may need to reschedule your appointment if you arrive late (15 or more minutes).  Arriving late affects you and other patients whose appointments are after yours.  Also, if you miss three or more appointments without notifying the office, you may be dismissed from the clinic at the provider's discretion.      For prescription refill requests, have your pharmacy contact our office and allow 72 hours for refills to be completed.    Today you received the following chemotherapy and/or immunotherapy agents: keytruda      To help prevent nausea and vomiting after your treatment, we encourage you to take your nausea medication as directed.  BELOW ARE SYMPTOMS THAT SHOULD BE REPORTED IMMEDIATELY: *FEVER GREATER THAN 100.4 F (38 C) OR HIGHER *CHILLS OR SWEATING *NAUSEA AND VOMITING THAT IS NOT CONTROLLED WITH YOUR NAUSEA MEDICATION *UNUSUAL SHORTNESS OF BREATH *UNUSUAL BRUISING OR BLEEDING *URINARY PROBLEMS (pain or burning when urinating, or frequent urination) *BOWEL PROBLEMS (unusual diarrhea, constipation, pain near the anus) TENDERNESS IN MOUTH AND THROAT WITH OR WITHOUT PRESENCE OF ULCERS (sore throat, sores in mouth, or a toothache) UNUSUAL RASH, SWELLING OR PAIN  UNUSUAL VAGINAL DISCHARGE OR ITCHING   Items with * indicate a potential emergency and should be followed up as soon as possible or go to the Emergency Department if any problems should occur.  Please show the CHEMOTHERAPY ALERT CARD or IMMUNOTHERAPY ALERT CARD at check-in to  the Emergency Department and triage nurse.  Should you have questions after your visit or need to cancel or reschedule your appointment, please contact [facility] MEDICAL ONCOLOG[facility]  Dept: [phone]  and follow the prompts.  Office hours are 8:00 a.m. to 4:30 p.m. Monday - Friday. Please note that voicemails left after 4:00 p.m. may not be returned until the following business day.  We are closed weekends and major holidays. You have access to a nurse at all times for urgent questions. Please call the main number to the clinic Dept: [phone] and follow the prompts.   For any non-urgent questions, you may also contact your provider using MyChart. We now offer e-Visits for anyone 18 and older to request care online for non-urgent symptoms. For details visit [email or web address].   Also download the MyChart app! Go to the app store, search "MyChart", open the app, select [facility], and log in with your MyChart username and password.  Due to Covid, a mask is required upon entering the hospital/clinic. If you do not have a mask, one will be given to you upon arrival. For doctor visits, patients may have 1 support person aged 18 or older with them. For treatment visits, patients cannot have anyone with them due to current Covid guidelines and our immunocompromised population.   

## 2021-09-01 ENCOUNTER — Ambulatory Visit: Payer: 59

## 2021-09-01 ENCOUNTER — Other Ambulatory Visit: Payer: Self-pay

## 2021-09-01 DIAGNOSIS — R293 Abnormal posture: Secondary | ICD-10-CM

## 2021-09-01 DIAGNOSIS — R600 Localized edema: Secondary | ICD-10-CM

## 2021-09-01 DIAGNOSIS — M25612 Stiffness of left shoulder, not elsewhere classified: Secondary | ICD-10-CM

## 2021-09-01 DIAGNOSIS — Z483 Aftercare following surgery for neoplasm: Secondary | ICD-10-CM | POA: Diagnosis not present

## 2021-09-01 DIAGNOSIS — L599 Disorder of the skin and subcutaneous tissue related to radiation, unspecified: Secondary | ICD-10-CM

## 2021-09-01 DIAGNOSIS — C50412 Malignant neoplasm of upper-outer quadrant of left female breast: Secondary | ICD-10-CM

## 2021-09-01 DIAGNOSIS — Z170 Estrogen receptor positive status [ER+]: Secondary | ICD-10-CM

## 2021-09-01 NOTE — Therapy (Signed)
[facility] @ [place], [place], [address] Phone: [phone]   Fax:  [phone]  Physical Therapy Treatment  Patient Details  Name: Jeanne Evans MRN: [id number] Date of Birth: February 06, 1986 Referring Provider (PT): Dr. Erroll Luna   Encounter Date: 09/01/2021   PT End of Session - 09/01/21 1114     Visit Number 5    Number of Visits 13    Date for PT Re-Evaluation 10/13/21    PT Start Time 1105    PT Stop Time 1155    PT Time Calculation (min) 50 min    Activity Tolerance Patient tolerated treatment well    Behavior During Therapy [facility] for tasks assessed/performed             Past Medical History:  Diagnosis Date   Asthma 07/31/2021   Breast cancer [facility])    Family history of ovarian cancer    Family history of prostate cancer    GERD (gastroesophageal reflux disease)    History of asthma    has albuterol, uses PRN   History of heart murmur in childhood    History of multiple allergies     Past Surgical History:  Procedure Laterality Date   BREAST LUMPECTOMY WITH RADIOACTIVE SEED AND SENTINEL LYMPH NODE BIOPSY Left 11/19/2020   Procedure: LEFT BREAST LUMPECTOMY X 2 WITH RADIOACTIVE SEED AND SENTINEL LYMPH NODE MAPPING;  Surgeon: Erroll Luna, MD;  Location: [facility];  Service: General;  Laterality: Left;  PECTORAL BLOCK   PORTACATH PLACEMENT Right 06/18/2020   Procedure: INSERTION PORT-A-CATH WITH ULTRASOUND GUIDANCE;  Surgeon: Erroll Luna, MD;  Location: [facility];  Service: General;  Laterality: Right;   RE-EXCISION OF BREAST LUMPECTOMY Left 12/09/2020   Procedure: RE-EXCISION OF LEFT BREAST LUMPECTOMY;  Surgeon: Erroll Luna, MD;  Location: [facility];  Service: General;  Laterality: Left;    There were no vitals filed for this visit.   Subjective Assessment - 09/01/21 1108     Subjective Chest and arm is a little better.  If I  wear the sleeve too long it sometimes makes my arm hurt so I take it off for a little while to let it relax. I always wear it to work for 6 hours. I have surgery again on December 13th. I have been getting some sharp pain in my left chest about 2x's per week.    Pertinent History Patient was diagnosed on 05/12/2020 with left breast cancer. She underwent neoadjuvant chemotherapy 06/19/2020-09/23/2020 and had a left lumpectomy and sentinel node biopsy (6 negative nodes) on 11/19/2020.  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 Numbness in posterior arm is a little better.    Patient Stated Goals Decrease pain and improve shoulder ROM, decrease swelling    Currently in Pain? No/denies    Pain Score 0-No pain    Pain Orientation Left    Pain Descriptors / Indicators Sore                [facility] PT Assessment - 09/01/21 0001       Assessment   Medical Diagnosis s/p left lumpectomy and sentinel node biopsy    Referring Provider (PT) Dr. [name] [place]    Onset Date/Surgical Date 11/19/20      Precautions   Precaution Comments lymphedema risk      Prior Function   Level of Independence Independent      AROM  Left Shoulder Flexion 165 Degrees    Left Shoulder ABduction 172 Degrees    Left Shoulder External Rotation 85 Degrees      Palpation   Palpation comment Tender axillary border of left pectorals, left forearm                           [facility] Adult PT Treatment/Exercise - 09/01/21 0001       Shoulder Exercises: Supine   Other Supine Exercises Supine AROM flexion, scaption, horizontal abd 2 x5 reps      Shoulder Exercises: Standing   Shoulder Elevation Limitations Elbow ext left 3 # 2 x 10    Other Standing Exercises Jobes flexion and scaption 1# x 10    Other Standing Exercises Elbow flexion bilaterally 3 # 2 x 10      Shoulder Exercises: Pulleys   Flexion 2 minutes    ABduction 2 minutes      Manual Therapy   Soft tissue mobilization to left  pectorals, lats in supine with stargazer position    Myofascial Release MFR to left axillary region,    Passive ROM PROM left shoulder flex, scaption, abd, ER                          PT Long Term Goals - 09/01/21 1159       PT LONG TERM GOAL #1   Title Patient will demonstrate independence in HEP   and that she has regained full function post operatively compared to baselines.    Time 6    Period Weeks    Status On-going    Target Date 10/13/21      PT LONG TERM GOAL #2   Title Patient will increase left shoulder active flexion to >/= 165 degrees for increased ease reaching.    Baseline 165 today    Time 6    Period Weeks    Status Achieved    Target Date 09/01/21      PT LONG TERM GOAL #3   Title Patient will increase left shoulder active abduction to >/= 165 degrees for increased ease reaching.    Baseline 172    Time 6    Period Weeks    Status Achieved    Target Date 09/01/21      PT LONG TERM GOAL #4   Title Pt will be independent in self management of left UE lymphedema    Baseline SOZO down to 1.9 from 7.6 in early Sept.    Time 6    Period Weeks    Status Achieved    Target Date 09/01/21      PT LONG TERM GOAL #5   Title Patient will report >/= 50% decrease in pain/tightness for increased tolerance of daily tasks.    Baseline 65% better    Time 6    Period Weeks    Status On-going    Target Date 10/13/21                   Plan - 09/01/21 1145     Clinical Impression Statement Pts shoulder ROM assessed today with excellent improvement for flexion and abduction.  ER still a little limited by tightness the pecs in standing but with good PROM lying down after manual work to release pecs.  Pt still fatigues easily with strengthening exercises but had no pain. Making good progress toward goals, but deficits remain  and pt will continue to benefit from skilled PT.   Personal Factors and Comorbidities Comorbidity 3+    Comorbidities Left  breast CA functionally triple neg, chemo, radiation    Stability/Clinical Decision Making Stable/Uncomplicated    Rehab Potential Excellent    PT Frequency 2x / week    PT Duration 6 weeks    PT Treatment/Interventions ADLs/Self Care Home Management;Therapeutic exercise;Patient/family education;Passive range of motion;Manual lymph drainage;Manual techniques;Scar mobilization    PT Next Visit Plan STM to pecs and upper quarter, MFR to axillary cord, PROM, continue to monitor for swelling (SOZO today 1.9 over baseline so great right now), MLD prn, pwer plate stretch for pecs    PT Home Exercise Plan sleeve and gauntlet daily unless fingers swell, MLD daily, resume shoulder wand and wall slides for shoulder ROM    Consulted and Agree with Plan of Care Patient             Patient will benefit from skilled therapeutic intervention in order to improve the following deficits and impairments:  Postural dysfunction, Decreased range of motion, Impaired UE functional use, Pain, Decreased knowledge of precautions, Decreased scar mobility, Increased fascial restricitons, Increased edema, Impaired flexibility  Visit Diagnosis: Aftercare following surgery for neoplasm  Stiffness of left shoulder, not elsewhere classified  Abnormal posture  Disorder of the skin and subcutaneous tissue related to radiation, unspecified  Localized edema  Malignant neoplasm of upper-outer quadrant of left breast in female, estrogen receptor positive ([place])     Problem List Patient Active Problem List   Diagnosis Date Noted   Asthma 07/31/2021   Lymphedema of left arm 02/02/2021   Drug-induced neutropenia ([place]) 09/30/2020   Genetic testing 06/26/2020   Family history of ovarian cancer    Family history of prostate cancer    Malignant neoplasm of upper-outer quadrant of left breast in female, estrogen receptor negative ([place]) 06/03/2020    Claris Pong, PT 09/01/2021, 12:04 PM  [facility] @ [place], [place], [address] Phone: [phone]   Fax:  [phone]  Name: Jeanne Evans MRN: [id number] Date of Birth: [birth year]

## 2021-09-03 ENCOUNTER — Ambulatory Visit: Payer: 59

## 2021-09-03 ENCOUNTER — Other Ambulatory Visit: Payer: Self-pay

## 2021-09-03 DIAGNOSIS — Z483 Aftercare following surgery for neoplasm: Secondary | ICD-10-CM

## 2021-09-03 DIAGNOSIS — M25612 Stiffness of left shoulder, not elsewhere classified: Secondary | ICD-10-CM

## 2021-09-03 DIAGNOSIS — C50412 Malignant neoplasm of upper-outer quadrant of left female breast: Secondary | ICD-10-CM

## 2021-09-03 DIAGNOSIS — L599 Disorder of the skin and subcutaneous tissue related to radiation, unspecified: Secondary | ICD-10-CM

## 2021-09-03 DIAGNOSIS — Z170 Estrogen receptor positive status [ER+]: Secondary | ICD-10-CM

## 2021-09-03 DIAGNOSIS — R600 Localized edema: Secondary | ICD-10-CM

## 2021-09-03 DIAGNOSIS — R293 Abnormal posture: Secondary | ICD-10-CM

## 2021-09-03 NOTE — Therapy (Signed)
[facility] @ [place], [place], [address] Phone: [phone]   Fax:  [phone]  Physical Therapy Treatment  Patient Details  Name: Jeanne Evans MRN: [id number] Date of Birth: Jul 30, 1986 Referring Provider (PT): Dr. Erroll Luna   Encounter Date: 09/03/2021   PT End of Session - 09/03/21 1109     Visit Number 6    Number of Visits 13    Date for PT Re-Evaluation 10/13/21    PT Start Time 1105    PT Stop Time 9509    PT Time Calculation (min) 49 min    Activity Tolerance Patient tolerated treatment well    Behavior During Therapy [facility] for tasks assessed/performed             Past Medical History:  Diagnosis Date   Asthma 07/31/2021   Breast cancer [facility])    Family history of ovarian cancer    Family history of prostate cancer    GERD (gastroesophageal reflux disease)    History of asthma    has albuterol, uses PRN   History of heart murmur in childhood    History of multiple allergies     Past Surgical History:  Procedure Laterality Date   BREAST LUMPECTOMY WITH RADIOACTIVE SEED AND SENTINEL LYMPH NODE BIOPSY Left 11/19/2020   Procedure: LEFT BREAST LUMPECTOMY X 2 WITH RADIOACTIVE SEED AND SENTINEL LYMPH NODE MAPPING;  Surgeon: Erroll Luna, MD;  Location: [facility];  Service: General;  Laterality: Left;  PECTORAL BLOCK   PORTACATH PLACEMENT Right 06/18/2020   Procedure: INSERTION PORT-A-CATH WITH ULTRASOUND GUIDANCE;  Surgeon: Erroll Luna, MD;  Location: [facility];  Service: General;  Laterality: Right;   RE-EXCISION OF BREAST LUMPECTOMY Left 12/09/2020   Procedure: RE-EXCISION OF LEFT BREAST LUMPECTOMY;  Surgeon: Erroll Luna, MD;  Location: [facility];  Service: General;  Laterality: Left;    There were no vitals filed for this visit.   Subjective Assessment - 09/03/21 1104     Subjective The coughing is coming again at night  and I can't sleep well. Chest and shoulder is feeling some better.  Still getting intermittent sharp pain in left breast    Pertinent History Patient was diagnosed on 05/12/2020 with left breast cancer. She underwent neoadjuvant chemotherapy 06/19/2020-09/23/2020 and had a left lumpectomy and sentinel node biopsy (6 negative nodes) on 11/19/2020.  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 Numbness in posterior arm is a little better.    Patient Stated Goals Decrease pain and improve shoulder ROM, decrease swelling    Currently in Pain? No/denies    Pain Score 0-No pain                               [facility] Adult PT Treatment/Exercise - 09/03/21 0001       Shoulder Exercises: Standing   Row Strengthening;Right;Left;10 reps    Row Weight (lbs) 3    Shoulder Elevation --   purple ball stabs x 20 ea direction   Shoulder Elevation Limitations Elbow ext left 3 # 2 x 10    Other Standing Exercises Jobes flexion and scaption 1# x 10    Other Standing Exercises Elbow flexion bilaterally 3 # 2 x 10      Manual Therapy   Soft tissue mobilization to left pectorals, lats in supine with stargazer position    Myofascial Release  MFR to left axillary region,    Passive ROM PROM left shoulder flex, scaption, abd, ER                          PT Long Term Goals - 09/01/21 1159       PT LONG TERM GOAL #1   Title Patient will demonstrate independence in HEP   and that she has regained full function post operatively compared to baselines.    Time 6    Period Weeks    Status On-going    Target Date 10/13/21      PT LONG TERM GOAL #2   Title Patient will increase left shoulder active flexion to >/= 165 degrees for increased ease reaching.    Baseline 165 today    Time 6    Period Weeks    Status Achieved    Target Date 09/01/21      PT LONG TERM GOAL #3   Title Patient will increase left shoulder active abduction to >/= 165 degrees for increased ease  reaching.    Baseline 172    Time 6    Period Weeks    Status Achieved    Target Date 09/01/21      PT LONG TERM GOAL #4   Title Pt will be independent in self management of left UE lymphedema    Baseline SOZO down to 1.9 from 7.6 in early Sept.    Time 6    Period Weeks    Status Achieved    Target Date 09/01/21      PT LONG TERM GOAL #5   Title Patient will report >/= 50% decrease in pain/tightness for increased tolerance of daily tasks.    Baseline 65% better    Time 6    Period Weeks    Status Achieved   Target Date 10/13/21                   Plan - 09/03/21 1110     Clinical Impression Statement Continued soft tissue mobilization to left pectorals and lats in supine, with MFR to axillary region and medial upper arm.  Ended with strengthening activities for UE's. Pt fatigued very quickly with shoulder stabs on wall done first, and continued to fatigue more in left UE by end of session.  Pt encouraged to do more at home with strengthening and was reissued the [employer] strength handout and advised to focus on UE exercises to get stronger before her next surgery    Personal Factors and Comorbidities Comorbidity 3+    Comorbidities Left breast CA functionally triple neg, chemo, radiation    Stability/Clinical Decision Making Stable/Uncomplicated    Rehab Potential Excellent    PT Frequency 2x / week    PT Duration 6 weeks    PT Treatment/Interventions ADLs/Self Care Home Management;Therapeutic exercise;Patient/family education;Passive range of motion;Manual lymph drainage;Manual techniques;Scar mobilization    PT Next Visit Plan STM to pecs and upper quarter, MFR to axillary cord, PROM, continue to monitor for swelling (SOZO today 1.9 over baseline so great right now), MLD prn, pwer plate stretch for pecs    PT Home Exercise Plan sleeve and gauntlet daily unless fingers swell, MLD daily, resume shoulder wand and wall slides for shoulder ROM    Consulted and Agree with Plan of  Care Patient             Patient will benefit from skilled therapeutic intervention in order to  improve the following deficits and impairments:  Postural dysfunction, Decreased range of motion, Impaired UE functional use, Pain, Decreased knowledge of precautions, Decreased scar mobility, Increased fascial restricitons, Increased edema, Impaired flexibility  Visit Diagnosis: Aftercare following surgery for neoplasm  Stiffness of left shoulder, not elsewhere classified  Abnormal posture  Disorder of the skin and subcutaneous tissue related to radiation, unspecified  Localized edema  Malignant neoplasm of upper-outer quadrant of left breast in female, estrogen receptor positive ([place])     Problem List Patient Active Problem List   Diagnosis Date Noted   Asthma 07/31/2021   Lymphedema of left arm 02/02/2021   Drug-induced neutropenia ([facility]) 09/30/2020   Genetic testing 06/26/2020   Family history of ovarian cancer    Family history of prostate cancer    Malignant neoplasm of upper-outer quadrant of left breast in female, estrogen receptor negative ([place]) 06/03/2020    Claris Pong, PT 09/03/2021, 12:01 PM  [facility] @ [place], [place], [address] Phone: [phone]   Fax:  [phone]  Name: Jeanne Evans MRN: [id number] Date of Birth: 1986/04/08

## 2021-09-08 ENCOUNTER — Encounter: Payer: Self-pay | Admitting: Oncology

## 2021-09-08 NOTE — Telephone Encounter (Signed)
No entry 

## 2021-09-16 ENCOUNTER — Other Ambulatory Visit: Payer: Self-pay | Admitting: *Deleted

## 2021-09-16 ENCOUNTER — Other Ambulatory Visit: Payer: Self-pay

## 2021-09-16 DIAGNOSIS — C50412 Malignant neoplasm of upper-outer quadrant of left female breast: Secondary | ICD-10-CM

## 2021-09-16 DIAGNOSIS — Z171 Estrogen receptor negative status [ER-]: Secondary | ICD-10-CM

## 2021-09-17 ENCOUNTER — Inpatient Hospital Stay: Payer: 59 | Attending: Oncology

## 2021-09-17 ENCOUNTER — Encounter: Payer: Self-pay | Admitting: Adult Health

## 2021-09-17 ENCOUNTER — Inpatient Hospital Stay (HOSPITAL_BASED_OUTPATIENT_CLINIC_OR_DEPARTMENT_OTHER): Payer: 59 | Admitting: Adult Health

## 2021-09-17 ENCOUNTER — Inpatient Hospital Stay: Payer: 59

## 2021-09-17 ENCOUNTER — Ambulatory Visit: Payer: 59

## 2021-09-17 ENCOUNTER — Other Ambulatory Visit: Payer: Self-pay

## 2021-09-17 VITALS — BP 135/80 | HR 84 | Temp 97.5°F | Resp 18 | Ht 63.0 in | Wt 183.4 lb

## 2021-09-17 DIAGNOSIS — M25612 Stiffness of left shoulder, not elsewhere classified: Secondary | ICD-10-CM | POA: Insufficient documentation

## 2021-09-17 DIAGNOSIS — C50412 Malignant neoplasm of upper-outer quadrant of left female breast: Secondary | ICD-10-CM | POA: Insufficient documentation

## 2021-09-17 DIAGNOSIS — Z171 Estrogen receptor negative status [ER-]: Secondary | ICD-10-CM | POA: Insufficient documentation

## 2021-09-17 DIAGNOSIS — Z170 Estrogen receptor positive status [ER+]: Secondary | ICD-10-CM | POA: Insufficient documentation

## 2021-09-17 DIAGNOSIS — Z483 Aftercare following surgery for neoplasm: Secondary | ICD-10-CM | POA: Insufficient documentation

## 2021-09-17 DIAGNOSIS — R293 Abnormal posture: Secondary | ICD-10-CM | POA: Insufficient documentation

## 2021-09-17 DIAGNOSIS — Z95828 Presence of other vascular implants and grafts: Secondary | ICD-10-CM

## 2021-09-17 DIAGNOSIS — Z5112 Encounter for antineoplastic immunotherapy: Secondary | ICD-10-CM | POA: Diagnosis present

## 2021-09-17 DIAGNOSIS — R600 Localized edema: Secondary | ICD-10-CM

## 2021-09-17 DIAGNOSIS — L599 Disorder of the skin and subcutaneous tissue related to radiation, unspecified: Secondary | ICD-10-CM

## 2021-09-17 LAB — CMP (CANCER CENTER ONLY)
ALT: 25 U/L (ref 0–44)
AST: 21 U/L (ref 15–41)
Albumin: 3.9 g/dL (ref 3.5–5.0)
Alkaline Phosphatase: 74 U/L (ref 38–126)
Anion gap: 8 (ref 5–15)
BUN: 10 mg/dL (ref 6–20)
CO2: 25 mmol/L (ref 22–32)
Calcium: 8.7 mg/dL — ABNORMAL LOW (ref 8.9–10.3)
Chloride: 108 mmol/L (ref 98–111)
Creatinine: 0.83 mg/dL (ref 0.44–1.00)
GFR, Estimated: >60 mL/min (ref >60)
Glucose, Bld: 122 mg/dL — ABNORMAL HIGH (ref 70–99)
Potassium: 4 mmol/L (ref 3.5–5.1)
Sodium: 141 mmol/L (ref 135–145)
Total Bilirubin: 0.6 mg/dL (ref 0.3–1.2)
Total Protein: 7.4 g/dL (ref 6.5–8.1)

## 2021-09-17 LAB — CBC WITH DIFFERENTIAL (CANCER CENTER ONLY)
Abs Immature Granulocytes: 0.01 10*3/uL (ref 0.00–0.07)
Basophils Absolute: 0 10*3/uL (ref 0.0–0.1)
Basophils Relative: 1 %
Eosinophils Absolute: 0.2 10*3/uL (ref 0.0–0.5)
Eosinophils Relative: 6 %
HCT: 36.9 % (ref 36.0–46.0)
Hemoglobin: 12.4 g/dL (ref 12.0–15.0)
Immature Granulocytes: 0 %
Lymphocytes Relative: 43 %
Lymphs Abs: 1.3 10*3/uL (ref 0.7–4.0)
MCH: 29.1 pg (ref 26.0–34.0)
MCHC: 33.6 g/dL (ref 30.0–36.0)
MCV: 86.6 fL (ref 80.0–100.0)
Monocytes Absolute: 0.4 10*3/uL (ref 0.1–1.0)
Monocytes Relative: 11 %
Neutro Abs: 1.2 10*3/uL — ABNORMAL LOW (ref 1.7–7.7)
Neutrophils Relative %: 39 %
Platelet Count: 229 10*3/uL (ref 150–400)
RBC: 4.26 MIL/uL (ref 3.87–5.11)
RDW: 14.2 % (ref 11.5–15.5)
WBC Count: 3.1 10*3/uL — ABNORMAL LOW (ref 4.0–10.5)
nRBC: 0 % (ref 0.0–0.2)

## 2021-09-17 LAB — TSH: TSH: 0.403 u[IU]/mL (ref 0.308–3.960)

## 2021-09-17 MED ORDER — SODIUM CHLORIDE 0.9% FLUSH
10.0000 mL | INTRAVENOUS | Status: DC | PRN
  Administered 2021-09-17: 10 mL

## 2021-09-17 MED ORDER — SODIUM CHLORIDE 0.9 % IV SOLN: Freq: Once | INTRAVENOUS | Status: AC

## 2021-09-17 MED ORDER — SODIUM CHLORIDE 0.9% FLUSH
10.0000 mL | INTRAVENOUS | Status: AC | PRN
  Administered 2021-09-17: 10 mL

## 2021-09-17 MED ORDER — SODIUM CHLORIDE 0.9 % IV SOLN
200.0000 mg | Freq: Once | INTRAVENOUS | Status: AC
  Administered 2021-09-17: 200 mg via INTRAVENOUS
  Filled 2021-09-17: qty 8

## 2021-09-17 MED ORDER — HEPARIN SOD (PORK) LOCK FLUSH 100 UNIT/ML IV SOLN
500.0000 [IU] | Freq: Once | INTRAVENOUS | Status: AC | PRN
Start: 2021-09-17 — End: 2021-09-17
  Administered 2021-09-17: 500 [IU]

## 2021-09-17 NOTE — Progress Notes (Signed)
Per MD okay to treat w/ keytruda with ANC 1.2

## 2021-09-17 NOTE — Patient Instructions (Signed)
[facility]COL[facility]  Discharge Instructions: ?Thank you for choosing [facility] to provide your oncology and hematology care.  ? ?If you have a lab appointment with the Cancer Center, please go directly to the Cancer Center and check in at the registration area. ?  ?Wear comfortable clothing and clothing appropriate for easy access to any Portacath or PICC line.  ? ?We strive to give you quality time with your provider. You may need to reschedule your appointment if you arrive late (15 or more minutes).  Arriving late affects you and other patients whose appointments are after yours.  Also, if you miss three or more appointments without notifying the office, you may be dismissed from the clinic at the provider?s discretion.    ?  ?For prescription refill requests, have your pharmacy contact our office and allow 72 hours for refills to be completed.   ? ?Today you received the following chemotherapy and/or immunotherapy agents: Keytruda ?  ?To help prevent nausea and vomiting after your treatment, we encourage you to take your nausea medication as directed. ? ?BELOW ARE SYMPTOMS THAT SHOULD BE REPORTED IMMEDIATELY: ?*FEVER GREATER THAN 100.4 F (38 ?C) OR HIGHER ?*CHILLS OR SWEATING ?*NAUSEA AND VOMITING THAT IS NOT CONTROLLED WITH YOUR NAUSEA MEDICATION ?*UNUSUAL SHORTNESS OF BREATH ?*UNUSUAL BRUISING OR BLEEDING ?*URINARY PROBLEMS (pain or burning when urinating, or frequent urination) ?*BOWEL PROBLEMS (unusual diarrhea, constipation, pain near the anus) ?TENDERNESS IN MOUTH AND THROAT WITH OR WITHOUT PRESENCE OF ULCERS (sore throat, sores in mouth, or a toothache) ?UNUSUAL RASH, SWELLING OR PAIN  ?UNUSUAL VAGINAL DISCHARGE OR ITCHING  ? ?Items with * indicate a potential emergency and should be followed up as soon as possible or go to the Emergency Department if any problems should occur. ? ?Please show the CHEMOTHERAPY ALERT CARD or IMMUNOTHERAPY ALERT CARD at check-in to the  Emergency Department and triage nurse. ? ?Should you have questions after your visit or need to cancel or reschedule your appointment, please contact [facility]  Dept: [phone]  and follow the prompts.  Office hours are 8:00 a.m. to 4:30 p.m. Monday - Friday. Please note that voicemails left after 4:00 p.m. may not be returned until the following business day.  We are closed weekends and major holidays. You have access to a nurse at all times for urgent questions. Please call the main number to the clinic Dept: [phone] and follow the prompts. ? ? ?For any non-urgent questions, you may also contact your provider using MyChart. We now offer e-Visits for anyone 18 and older to request care online for non-urgent symptoms. For details visit [email or web address]. ?  ?Also download the MyChart app! Go to the app store, search "MyChart", open the app, select [facility], and log in with your MyChart username and password. ? ?Due to Covid, a mask is required upon entering the hospital/clinic. If you do not have a mask, one will be given to you upon arrival. For doctor visits, patients may have 1 support person aged 18 or older with them. For treatment visits, patients cannot have anyone with them due to current Covid guidelines and our immunocompromised population.  ? ?

## 2021-09-17 NOTE — Progress Notes (Signed)
[place]  [phone] [phone]     ID: Jeanne Evans DOB: 1986/01/03  MR#: [id number]  VQQ#:[id number]  Patient Care Team: [name], MD as PCP - General (Obstetrics and Gynecology) Rockwell Germany, RN as Oncology Nurse Navigator Mauro Kaufmann, RN as Oncology Nurse Navigator Erroll Luna, MD as Consulting Physician (General Surgery) Magrinat, Virgie Dad, MD as Consulting Physician (Oncology) Kyung Rudd, MD as Consulting Physician (Radiation Oncology) Servando Salina, MD as Consulting Physician (Obstetrics and Gynecology) Brand Males, MD as Consulting Physician (Pulmonary Disease) Scot Dock, NP OTHER MD:  CHIEF COMPLAINT: 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   Most recent mammography was 05/05/2021 with a breast density category B and no evidence of malignancy.  Jeanne Evans notes that her body has been acting differently.  She was previously on a cruise and returned about 5 days ago.  She has been increasingly tired, and had some body aches.  She is now stressed out because she had to move out.    She has nowhere to live.  She is working with our social work team to try to find a place to live.  She continues to have left arm numbness.  She is going to PT.  She has difficulty lifting over 15 pounds.  She says that her left arm hurts with lifting.  She has had some left breast pain from time to time.    She is requesting another work note since her left arm is still having difficulty with lifting.  She has previously attempted to lift more than 15 pounds but that caused intense pain..  She is working 6 hours a day.  She has not been able to increase to 8 hours due to continued fatigued.     REVIEW OF SYSTEMS: Review of Systems  Constitutional:  Negative  for appetite change, chills, fatigue, fever and unexpected weight change.  HENT:   Negative for hearing loss, lump/mass and trouble swallowing.   Eyes:  Negative for eye problems and icterus.  Respiratory:  Negative for chest tightness, cough and shortness of breath.   Cardiovascular:  Negative for chest pain, leg swelling and palpitations.  Gastrointestinal:  Negative for abdominal distention, abdominal pain, constipation, diarrhea, nausea and vomiting.  Endocrine: Negative for hot flashes.  Genitourinary:  Negative for difficulty urinating.   Musculoskeletal:  Negative for arthralgias.  Skin:  Negative for itching and rash.  Neurological:  Negative for dizziness, extremity weakness, headaches and numbness.  Hematological:  Negative for adenopathy. Does not bruise/bleed easily.  Psychiatric/Behavioral:  Negative for depression. The patient is not nervous/anxious.     COVID 19 VACCINATION STATUS: [place] x2, most recently 01/2021   HISTORY OF CURRENT ILLNESS: From the original intake note:  Jeanne Evans herself palpated a painful left breast lump. She underwent bilateral diagnostic mammography with tomography and left breast ultrason[facility] at [facility] on 05/12/2020 showing: breast density category B; palpable 2.7 cm oval hypoechoic mass in left breast at 2-3 o'clock; 2.2 cm left axillary lymph node/grouping of nodes.  Accordingly on 05/29/2020 she proceeded to biopsy of the left breast area in question. The pathology from this procedure ([id number]) showed: invasive ductal carcinoma, grade 3. Prognostic indicators significant for: estrogen receptor, 10% positive with weak staining intensity and progesterone receptor, 0% negative. Proliferation marker Ki67 at 95%. HER2 equivocal by immunohistochemistry (2+), but  negative by fluorescent in situ hybridization with a signals ratio 1.77 and number per cell 2.65.  Biopsy of the left axillary lymph node in question was negative and  concordant  The patient's subsequent history is as detailed below.   PAST MEDICAL HISTORY: Past Medical History:  Diagnosis Date   Asthma 07/31/2021   Breast cancer ([place])    Family history of ovarian cancer    Family history of prostate cancer    GERD (gastroesophageal reflux disease)    History of asthma    has albuterol, uses PRN   History of heart murmur in childhood    History of multiple allergies   History of allergy related headaches   PAST SURGICAL HISTORY: Past Surgical History:  Procedure Laterality Date   BREAST LUMPECTOMY WITH RADIOACTIVE SEED AND SENTINEL LYMPH NODE BIOPSY Left 11/19/2020   Procedure: LEFT BREAST LUMPECTOMY X 2 WITH RADIOACTIVE SEED AND SENTINEL LYMPH NODE MAPPING;  Surgeon: Erroll Luna, MD;  Location: [place];  Service: General;  Laterality: Left;  PECTORAL BLOCK   PORTACATH PLACEMENT Right 06/18/2020   Procedure: INSERTION PORT-A-CATH WITH ULTRASOUND GUIDANCE;  Surgeon: Erroll Luna, MD;  Location: [place];  Service: General;  Laterality: Right;   RE-EXCISION OF BREAST LUMPECTOMY Left 12/09/2020   Procedure: RE-EXCISION OF LEFT BREAST LUMPECTOMY;  Surgeon: Erroll Luna, MD;  Location: [place];  Service: General;  Laterality: Left;  She reports having a boil lanced from her perineal area   FAMILY HISTORY: Family History  Problem Relation Age of Onset   Lung cancer Maternal Uncle        dx late 32s   Prostate cancer Paternal Uncle        dx late 34s   Ovarian cancer Paternal Grandmother        dx 19s   Prostate cancer Paternal Grandfather        dx 43s   Prostate cancer Paternal Uncle        dx early 28s  The patient's father is 28 and her mother 71, as of 05/2020.  The patient has two brothers and one sister. She reports ovarian cancer in her paternal grandmother, prostate cancer in her paternal grandfather and a paternal uncle, and lung cancer in a maternal uncle.   GYNECOLOGIC  HISTORY:  No LMP recorded. (Menstrual status: IUD). Menarche: 35 years old Age at first live birth: 35 years old [place] P 2 LMP current, experiences spotting Contraceptive: Mirena IUD in place HRT n/a  Hysterectomy? no BSO? 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 She describes herself as single. She lives at home with her children, [name] (age 60) and [name] (age 13).  She is originally from [place] her home.    ADVANCED DIRECTIVES: Not in place. She intends to name her mother, Jeanne Evans, as her HCPOA.  Jeanne Evans lives in [place] and can be reached at [phone].  The patient was given the appropriate documents to complete and notarized at her discretion at the time of her visit 06/04/2020   HEALTH MAINTENANCE: Social History   Tobacco Use   Smoking status: Never   Smokeless tobacco: Never  Substance Use Topics   Alcohol use: Yes    Comment: occas   Drug use: Yes    Types: Marijuana     Colonoscopy: n/a (age)  PAP: 04/2020  Bone density: n/a (age)  Allergies  Allergen Reactions   Sulfa Antibiotics Hives    Current Outpatient Medications  Medication Sig Dispense Refill   Acetaminophen (TYLENOL) 325 MG CAPS Take by mouth. (Patient not taking: Reported on 07/31/2021)     albuterol (VENTOLIN HFA) 108 (90 Base) MCG/ACT inhaler Inhale 2 puffs into the lungs every 6 (six) hours as needed for wheezing or shortness of breath. 8 g 6   fluticasone furoate-vilanterol (BREO ELLIPTA) 100-25 MCG/INH AEPB Inhale 1 puff into the lungs daily. 60 each 5   gabapentin (NEURONTIN) 300 MG capsule Take 1 capsule (300 mg total) by mouth at bedtime. 90 capsule 4   ibuprofen (ADVIL) 800 MG tablet Take 1 tablet (800 mg total) by mouth every 8 (eight) hours as needed. (Patient not taking: Reported on 07/31/2021) 30 tablet 0   montelukast (SINGULAIR) 10 MG  tablet Take 1 tablet (10 mg total) by mouth at bedtime. 30 tablet 11   omeprazole (PRILOSEC) 40 MG capsule Take 1 capsule (40 mg total) by mouth at bedtime. 30 capsule 3   promethazine-codeine (PHENERGAN WITH CODEINE) 6.25-10 MG/5ML syrup Take 5 mLs by mouth every 8 (eight) hours as needed for cough. (Patient not taking: Reported on 07/31/2021) 120 mL 0   valACYclovir (VALTREX) 500 MG tablet Take 500 mg by mouth 2 (two) times daily.     venlafaxine XR (EFFEXOR-XR) 75 MG 24 hr capsule Take 1 capsule (75 mg total) by mouth daily with breakfast. 90 capsule 4   No current facility-administered medications for this visit.    OBJECTIVE:  Vitals:   09/17/21 1324  BP: 135/80  Pulse: 84  Resp: 18  Temp: (!) 97.5 F (36.4 C)  SpO2: 100%      Body mass index is 32.49 kg/m.   Wt Readings from Last 3 Encounters:  09/17/21 183 lb 6.4 oz (83.2 kg)  08/27/21 185 lb (83.9 kg)  08/06/21 185 lb 6.4 oz (84.1 kg)  ECOG FS:1 - Symptomatic but completely ambulatory GENERAL: Patient is a well appearing female in no acute distress HEENT:  Sclerae anicteric.  Oropharynx clear and moist. No ulcerations or evidence of oropharyngeal candidiasis. Neck is supple.  NODES:  No cervical, supraclavicular, or axillary lymphadenopathy palpated.  BREAST EXAM:  Deferred. LUNGS:  Clear to auscultation bilaterally.  No wheezes or rhonchi. HEART:  Regular rate and rhythm. No murmur appreciated. ABDOMEN:  Soft, nontender.  Positive, normoactive bowel sounds. No organomegaly palpated. MSK:  No focal spinal tenderness to palpation. Full range of motion bilaterally in the upper extremities. EXTREMITIES:  No peripheral edema.   SKIN:  Clear with no obvious rashes or skin changes. No nail dyscrasia. NEURO:  Nonfocal. Well oriented.  Appropriate affect.    LAB RESULTS:  CMP     Component Value Date/Time   NA 136 08/27/2021 1009   K 3.8 08/27/2021 1009   CL 103 08/27/2021 1009   CO2 28 08/27/2021 1009   GLUCOSE 94  08/27/2021 1009   BUN 11 08/27/2021 1009   CREATININE 0.76 08/27/2021 1009   CALCIUM 9.3 08/27/2021 1009   PROT 7.9 08/27/2021 1009   ALBUMIN 4.3 08/27/2021 1009   AST 17 08/27/2021 1009   ALT 18 08/27/2021 1009   ALKPHOS 75 08/27/2021 1009   BILITOT 1.1 08/27/2021 1009   GFRNONAA >60 08/27/2021 1009   GFRAA >60 08/05/2020 0955   GFRAA >60 06/04/2020 0830    No results found for: TOTALPROTELP, ALBUMINELP, A1GS, A2GS, BETS, BETA2SER, GAMS, MSPIKE, SPEI  Lab Results  Component Value  Date   WBC 3.1 (L) 09/17/2021   NEUTROABS 1.2 (L) 09/17/2021   HGB 12.4 09/17/2021   HCT 36.9 09/17/2021   MCV 86.6 09/17/2021   PLT 229 09/17/2021    No results found for: LABCA2  No components found for: YQMVHQ469  No results for input(s): INR in the last 168 hours.  No results found for: LABCA2  No results found for: GEX528  No results found for: UXL244  No results found for: WNU272  No results found for: CA2729  No components found for: HGQUANT  No results found for: CEA1 / No results found for: CEA1   No results found for: AFPTUMOR  No results found for: CHROMOGRNA  No results found for: KPAFRELGTCHN, LAMBDASER, KAPLAMBRATIO (kappa/lambda light chains)  No results found for: HGBA, HGBA2QUANT, HGBFQUANT, HGBSQUAN (Hemoglobinopathy evaluation)   No results found for: LDH  No results found for: IRON, TIBC, IRONPCTSAT (Iron and TIBC)  No results found for: FERRITIN  Urinalysis No results found for: COLORURINE, APPEARANCEUR, LABSPEC, PHURINE, GLUCOSEU, HGBUR, BILIRUBINUR, KETONESUR, PROTEINUR, UROBILINOGEN, NITRITE, LEUKOCYTESUR   STUDIES: No results found.   ELIGIBLE FOR AVAILABLE RESEARCH PROTOCOL: no  ASSESSMENT: 35 y.o. [place] woman status post left breast upper outer quadrant biopsy 05/29/2020 for a clinical T2 N0, stage IIB invasive ductal carcinoma, functionally triple negative, with an MIB-1 of 95%.  (1) genetics testing 06/23/2020 through [facility]  Multi-Cancer Panel found no deleterious mutations in AIP, ALK, APC, ATM, AXIN2,BAP1,  BARD1, BLM, BMPR1A, BRCA1, BRCA2, BRIP1, CASR, CDC73, CDH1, CDK4, CDKN1B, CDKN1C, CDKN2A (p14ARF), CDKN2A (p16INK4a), CEBPA, CHEK2, CTNNA1, DICER1, DIS3L2, EGFR (c.2369C>T, p.Thr790Met variant only), EPCAM (Deletion/duplication testing only), FH, FLCN, GATA2, GPC3, GREM1 (Promoter region deletion/duplication testing only), HOXB13 (c.251G>A, p.Gly84Glu), HRAS, KIT, MAX, MEN1, MET, MITF (c.952G>A, p.Glu318Lys variant only), MLH1, MSH2, MSH3, MSH6, MUTYH, NBN, NF1, NF2, NTHL1, PALB2, PDGFRA, PHOX2B, PMS2, POLD1, POLE, POT1, PRKAR1A, PTCH1, PTEN, RAD50, RAD51C, RAD51D, RB1, RECQL4, RET, RNF43, RUNX1, SDHAF2, SDHA (sequence changes only), SDHB, SDHC, SDHD, SMAD4, SMARCA4, SMARCB1, SMARCE1, STK11, SUFU, TERC, TERT, TMEM127, TP53, TSC1, TSC2, VHL, WRN and WT1.   (2) neoadjuvant chemotherapy consisting of cyclophosphamide and doxorubicin in dose dense fashion x4 started 06/19/2020, completed 08/05/2020, followed by paclitaxel and carboplatin weekly x12 starting 08/26/2020, completing 4 doses 09/23/2020  (a) echocardiogram on 06/13/2020 showed an EF of 60-65%  (b) paclitaxel and carboplatin discontinued after 4 doses because of neuropathy and cytopenias  (3) status post left lumpectomy and sentinel lymph node sampling 11/19/2020 for a residual yp T1c ypN0 invasive ductal carcinoma involving the superior margin  (a) additional surgery 12/09/2020 cleared the margin in question  (b) repeat prognostic panel confirms triple negative disease  (4) adjuvant radiation : 01/22/2021 through 03/10/2021 Site Technique Total Dose (Gy) Dose per Fx (Gy) Completed Fx Beam Energies  Breast, Left: Breast_Lt 3D 50.4/50.4 1.8 28/28 6X  Breast, Left: Breast_Lt_Bst 3D 10/10 2 5/5 6X   (a) received capecitabine sensitization, started 01/26/2021  (5) pembrolizumab/Keytruda began on 04/16/2021, held 05/11/2021 (after 2 doses) with persistent cough, later  attributed to asthma (A) pembrolizumab resumed 08/06/2021 (B) PD-L1 combined score is 10  (6) molecular pathology from the 11/19/2020 sample showed a mutation in [id number].  There were no mutations in BRCA1 or 2, ER BB 2 or PIK 3 CA.  The microsatellite status was equivocal and the tumor mutational burden could not be determined on the sample.   PLAN: Jeanne Evans is here today for follow-up of her triple negative breast cancer.  She has no clinical signs  of recurrence.  She continues on pembrolizumab with good tolerance.  She receives this every 3 weeks and will continue to do so.  She plans on reaching out to our social work team to see if they were able to help her with any housing options.  She continues to work with physical therapy for her left arm lymphedema.  We did write a letter for her to extend her work accommodations for another 8 weeks.  This recommendation includes her working 6 to 8 hours a day 5 days a week and not lifting more than 15 pounds.  She Ronning will return every 3 weeks for her treatment.  She does have follow-up with Dr. Jana Hakim on December 22.  She knows to call us in the meantime if she has any questions or concerns because we can always see her sooner if needed.   Total encounter time 25 minutes in face-to-face visit time, chart review, lab review, order entry, care coordination, documentation of the encounter.  Jeanne Bihari, NP 09/17/21 1:43 PM Medical Oncology and Hematology [facility] [place], [place] [address] Tel. [phone]    Fax. [phone]    *Total Encounter Time as defined by the Centers for Medicare and Medicaid Services includes, in addition to the face-to-face time of a patient visit (documented in the note above) non-face-to-face time: obtaining and reviewing outside history, ordering and reviewing medications, tests or procedures, care coordination (communications with other health care professionals or  caregivers) and documentation in the medical record.

## 2021-09-17 NOTE — Therapy (Signed)
[facility] @ [address] [place], [place], [address] Phone: [phone]   Fax:  [phone]  Physical Therapy Treatment  Patient Details  Name: Jeanne Evans MRN: [id number] Date of Birth: 1986/09/03 Referring Provider (PT): Dr. Erroll Luna   Encounter Date: 09/17/2021   PT End of Session - 09/17/21 1206     Visit Number 7    Number of Visits 13    Date for PT Re-Evaluation 10/13/21    PT Start Time 1115    PT Stop Time 1203    PT Time Calculation (min) 48 min    Activity Tolerance Patient tolerated treatment well    Behavior During Therapy [facility] for tasks assessed/performed             Past Medical History:  Diagnosis Date   Asthma 07/31/2021   Breast cancer [facility])    Family history of ovarian cancer    Family history of prostate cancer    GERD (gastroesophageal reflux disease)    History of asthma    has albuterol, uses PRN   History of heart murmur in childhood    History of multiple allergies     Past Surgical History:  Procedure Laterality Date   BREAST LUMPECTOMY WITH RADIOACTIVE SEED AND SENTINEL LYMPH NODE BIOPSY Left 11/19/2020   Procedure: LEFT BREAST LUMPECTOMY X 2 WITH RADIOACTIVE SEED AND SENTINEL LYMPH NODE MAPPING;  Surgeon: Erroll Luna, MD;  Location: [place];  Service: General;  Laterality: Left;  PECTORAL BLOCK   PORTACATH PLACEMENT Right 06/18/2020   Procedure: INSERTION PORT-A-CATH WITH ULTRASOUND GUIDANCE;  Surgeon: Erroll Luna, MD;  Location: [place];  Service: General;  Laterality: Right;   RE-EXCISION OF BREAST LUMPECTOMY Left 12/09/2020   Procedure: RE-EXCISION OF LEFT BREAST LUMPECTOMY;  Surgeon: Erroll Luna, MD;  Location: [place];  Service: General;  Laterality: Left;    There were no vitals filed for this visit.   Subjective Assessment - 09/17/21 1117     Subjective Both arms are really sore from moving  everything by the 31st of October. Its more muscular I think. I have been wearing the sleeve and that helps. My arm did feel a little swollen the other day.    Pertinent History Patient was diagnosed on 05/12/2020 with left breast cancer. She underwent neoadjuvant chemotherapy 06/19/2020-09/23/2020 and had a left lumpectomy and sentinel node biopsy (6 negative nodes) on 11/19/2020.  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 Numbness in posterior arm is a little better.    Patient Stated Goals Decrease pain and improve shoulder ROM, decrease swelling    Currently in Pain? Yes    Pain Score 6     Pain Orientation Left    Pain Descriptors / Indicators Sore    Pain Type Chronic pain    Pain Onset More than a month ago    Pain Frequency Intermittent    Multiple Pain Sites No                    L-DEX FLOWSHEETS - 09/17/21 1200       L-DEX LYMPHEDEMA SCREENING   Measurement Type Unilateral    L-DEX MEASUREMENT EXTREMITY Upper Extremity    POSITION  Standing    DOMINANT SIDE Right    At Risk Side Left    BASELINE SCORE (UNILATERAL) -1.2    L-DEX SCORE (UNILATERAL) 3.4    VALUE CHANGE (UNILAT) 4.6  [place] Adult PT Treatment/Exercise - 09/17/21 0001       Manual Therapy   Soft tissue mobilization to left pectorals, lats in supine with stargazer position, and UT and left UE     Myofascial Release MFR to left axillary region, (P)     Passive ROM PROM left shoulder flex, scaption, abd, ER (P)                           PT Long Term Goals - 09/01/21 1159       PT LONG TERM GOAL #1   Title Patient will demonstrate independence in HEP   and that she has regained full function post operatively compared to baselines.    Time 6    Period Weeks    Status On-going    Target Date 10/13/21      PT LONG TERM GOAL #2   Title Patient will increase left shoulder active flexion to >/= 165 degrees for increased ease reaching.     Baseline 165 today    Time 6    Period Weeks    Status Achieved    Target Date 09/01/21      PT LONG TERM GOAL #3   Title Patient will increase left shoulder active abduction to >/= 165 degrees for increased ease reaching.    Baseline 172    Time 6    Period Weeks    Status Achieved    Target Date 09/01/21      PT LONG TERM GOAL #4   Title Pt will be independent in self management of left UE lymphedema    Baseline SOZO down to 1.9 from 7.6 in early Sept.    Time 6    Period Weeks    Status Achieved    Target Date 09/01/21      PT LONG TERM GOAL #5   Title Patient will report >/= 50% decrease in pain/tightness for increased tolerance of daily tasks.    Baseline 65% better    Time 6    Period Weeks    Status On-going    Target Date 10/13/21                   Plan - 09/17/21 1207     Clinical Impression Statement Pts arms very sore after moving this week. Performed Soft tissue mobilization to pectoralis, lats and UT, and left UE.  Pt tight and tender throughout and under alot of stress presently from having difficulty finding a place for to live for herself and her children. PROM performed to left UE, and MFR to left axillary region.  Did another SOZO secondary to pt concerns of increased swelling after moving.  Score has increased from last one performed in September but is not in the danger zone.    Personal Factors and Comorbidities Comorbidity 3+    Comorbidities Left breast CA functionally triple neg, chemo, radiation    Stability/Clinical Decision Making Stable/Uncomplicated    Rehab Potential Excellent    PT Frequency 2x / week    PT Duration 6 weeks    PT Treatment/Interventions ADLs/Self Care Home Management;Therapeutic exercise;Patient/family education;Passive range of motion;Manual lymph drainage;Manual techniques;Scar mobilization    PT Next Visit Plan STM to pecs and upper quarter, MFR to axillary cord, PROM, continue to monitor for swelling (SOZO today  4.6 over baseline ), MLD prn, pwer plate stretch for pecs    Consulted and Agree with Plan of Care  Patient             Patient will benefit from skilled therapeutic intervention in order to improve the following deficits and impairments:  Postural dysfunction, Decreased range of motion, Impaired UE functional use, Pain, Decreased knowledge of precautions, Decreased scar mobility, Increased fascial restricitons, Increased edema, Impaired flexibility  Visit Diagnosis: Aftercare following surgery for neoplasm  Stiffness of left shoulder, not elsewhere classified  Abnormal posture  Disorder of the skin and subcutaneous tissue related to radiation, unspecified  Localized edema  Malignant neoplasm of upper-outer quadrant of left breast in female, estrogen receptor positive ([place])     Problem List Patient Active Problem List   Diagnosis Date Noted   Asthma 07/31/2021   Lymphedema of left arm 02/02/2021   Drug-induced neutropenia ([facility]) 09/30/2020   Genetic testing 06/26/2020   Family history of ovarian cancer    Family history of prostate cancer    Malignant neoplasm of upper-outer quadrant of left breast in female, estrogen receptor negative ([place]) 06/03/2020    Claris Pong, PT 09/17/2021, 12:13 PM  [facility] @ [address] [place], [place], [address] Phone: [phone]   Fax:  [phone]  Name: Jeanne Evans MRN: [id number] Date of Birth: [birth year]

## 2021-09-18 ENCOUNTER — Encounter: Payer: Self-pay | Admitting: General Practice

## 2021-09-18 NOTE — Progress Notes (Signed)
[facility]  Initial Assessment   Saltillo D Chauncey is a 35 y.o. year old female contacted by phone. Clinical Social Work was referred by medical oncology for assessment of psychosocial needs.   SDOH (Social Determinants of Health) assessments performed: Yes SDOH Interventions    Flowsheet Row Most Recent Value  SDOH Interventions   Food Insecurity Interventions --  [place] Salvage has Physicist, medical, cooks in hotel room at this point]  Housing Interventions Intervention Not Indicated  Stress Interventions --  [issues are housing related]  Transportation Interventions Intervention Not Indicated       Distress Screen completed: Yes ONCBCN DISTRESS SCREENING 01/06/2021  Screening Type Initial Screening  Distress experienced in past week (1-10) 3  Emotional problem type Nervousness/Anxiety  Information Concerns Type Lack of info about treatment  Physical Problem type -  Other Contact via phone.    Family/Social Information:  Housing Arrangement: patient lives with children, she is currently living in the [place] [address] with her children.  She had paid rent until Tuesday 09/22/21.  She was in Section 8 housing, landlord sold the building and requested that she leave the home by 09/14/21.  She was notified by landlord in mid summer that he was no longer willing to rent the home under Section 8.   Her Section 8 caseworker is involved with situation.  She has been looking for alternative housing without success since summer 2022. She has tried to relocate her family to homeless shelter, however all are full at this time.  Section 8 caseworker ([place] [phone])  gave her a list of apartments to call, she has called all of them, all have waiting lists of 4 - 12 months.   Family members/support persons in your life? She is from [place], no family or friends in town.  Lives with children ages 53 and 54.  Transportation concerns: yes, has car  Employment: Working full time.  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   Type of concern: Utilities and Rent/ mortgage Food access concerns: yes, has Physicist, medical, "we can only eat snacks or fast food, cannot cook in the room" Religious or spiritual practice: no Medication Concerns: no  Services Currently in place:  none  Coping/ Adjustment to diagnosis: Patient understands treatment plan and what happens next? 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  She has had a lumpectomy, radiation therapy and is in neoadjuvant chemotherapy.   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  Diagnosis and treatment for patient has been difficult on both her and her two daughters (35 and 78).  They have been impacted by observable physical changes in mother, worries associated w her treatment/disease.  Now the family is facing housing instability due to loss of Section 8 housing due to landlord selling property.  As of this week, they are staying in a hotel, will not be able to continue past next Tuesday (11/8) and will be essentially homeless as of 11/9.  Children are continuing in school.  Patient has tried multiple avenues to find alternative housing, without any success.  She needs a 3 bedroom home as one of her daughters has ADHD and  needs a separate bedroom per her treating physician. This same daughter has struggled with suicidal ideation this year and is under physician care.   Concerns about diagnosis and/or treatment: How I will pay for the services I need and cannot return to work as before treatment Patient reported stressors: Housing and Physical issues Hopes and priorities: stable housing Patient  enjoys  none at this time, she is fully engaged in helping stabilize her family Current coping skills/ strengths: Capable of independent living     SUMMARY: Current SDOH Barriers:  Financial constraints related to elevated expenses of living in hotel and no access to kitchen, Limited social support, and Housing barriers  Interventions: Discussed common feeling and emotions when being diagnosed with cancer, and the importance of support during treatment Informed patient of the support team roles and support services at [facility] Provided CSW contact information and encouraged patient to call with any questions or concerns Referred patient to [facility], submitted [place] paperwork to agency w client consent.  CSW left VM for Section 8 caseworker.  Per patient, she has called all local family shelters and transitional housing options - all are full at this time w significant wait lists.  She does not want to return to [place] where she has family support, but may have to do this if she cannot find stable affordable housing for her family in [place].    Follow Up Plan: CSW will follow-up with patient by phone , will refer to [place] to continue to follow patient.   Patient verbalizes understanding of plan: Yes    Beverely Pace , [place], LCSW Clinical Social Worker Phone:  [phone]

## 2021-09-22 ENCOUNTER — Encounter: Payer: Self-pay | Admitting: General Practice

## 2021-09-22 ENCOUNTER — Encounter: Payer: Self-pay | Admitting: Oncology

## 2021-09-22 NOTE — Progress Notes (Signed)
Jeanne Evans CSW Progress Notes  Call to patient - she has not found a new place yet.  She is planning to call several housing options suggested by her caseworker.  She will continue to work on resolving her situation.  Edwyna Shell, LCSW Clinical Social Worker Phone:  [phone]

## 2021-09-22 NOTE — Progress Notes (Signed)
Spoke w/ pt that stated she needs assistance paying for her security deposit for new housing.  I gave her the current balance on her [employer] grant and told her what's needed in order to process the check to assist w/ her security deposit and the length of time it will take for the merchant to receive the check.

## 2021-09-22 NOTE — Progress Notes (Signed)
[place] CSW Progress Notes  Call from patient's Section 8 worker, worker is aware of patient's situation and trying to help her locate suitable housing within parameters of her voucher.  She is actively sending patient suggestions/  Given the process of getting a property approved to take the Section 8 voucher, worker states this can be a 3 - 4 week process from identifying property to moving in.  Patient will need help continuing to pay for hotel where she is currently residing w daughters.  Message sent to Financial Advocates to determine if she has applied for/used the [employer].  Edwyna Shell, LCSW Clinical Social Worker Phone:  [phone]

## 2021-09-24 ENCOUNTER — Ambulatory Visit: Payer: 59

## 2021-09-24 ENCOUNTER — Other Ambulatory Visit: Payer: Self-pay

## 2021-09-24 ENCOUNTER — Encounter: Payer: Self-pay | Admitting: General Practice

## 2021-09-24 DIAGNOSIS — M25612 Stiffness of left shoulder, not elsewhere classified: Secondary | ICD-10-CM

## 2021-09-24 DIAGNOSIS — Z483 Aftercare following surgery for neoplasm: Secondary | ICD-10-CM

## 2021-09-24 DIAGNOSIS — Z170 Estrogen receptor positive status [ER+]: Secondary | ICD-10-CM

## 2021-09-24 DIAGNOSIS — R600 Localized edema: Secondary | ICD-10-CM

## 2021-09-24 DIAGNOSIS — Z5112 Encounter for antineoplastic immunotherapy: Secondary | ICD-10-CM | POA: Diagnosis not present

## 2021-09-24 DIAGNOSIS — L599 Disorder of the skin and subcutaneous tissue related to radiation, unspecified: Secondary | ICD-10-CM

## 2021-09-24 DIAGNOSIS — R293 Abnormal posture: Secondary | ICD-10-CM

## 2021-09-24 DIAGNOSIS — C50412 Malignant neoplasm of upper-outer quadrant of left female breast: Secondary | ICD-10-CM

## 2021-09-24 NOTE — Progress Notes (Signed)
[place] CSW Progress Notes  Call to patient - she has located a property accessible under her Section 8 voucher limit.  She will work w her caseworker to determine if this is a property she should move forward with.  Her employer has paid the last two nights at the hotel - she will need to pay again tomorrow for her lodging.  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  She needs help w security deposit of $2100, she has $550 available on her Alight grant per her financial advocate.  Reached out for ideas to [employer], [employer] and [employer].  Provided her with first disbursement of [place] for pick up at front desk.  Provided her with copies of applications for [employer] and [employer] - however, neither of these agencies provide immediate emergency assistance nor do they assist with security deposits or hotel stays.  She will keep in touch with [place].  Edwyna Shell, LCSW Clinical Social Worker Phone:  [phone]

## 2021-09-24 NOTE — Progress Notes (Signed)
[place] CSW Progress Notes  Email from [employer] - they can pay one week hotel stay, with limit of $1000 for patient.  She needs to meet Gilberto Better at [facility] 09/25/2021 at 10 AM.  Patient aware and will go to agency to request help w required documents.   Edwyna Shell, LCSW Clinical Social Worker Phone:  [phone]

## 2021-09-24 NOTE — Therapy (Signed)
[facility] @ [place], [place], [address] Phone: [phone]   Fax:  [phone]  Physical Therapy Treatment  Patient Details  Name: Jeanne Evans MRN: [id number] Date of Birth: [birth year] Referring Provider (PT): Dr. Erroll Luna   Encounter Date: 09/24/2021   PT End of Session - 09/24/21 1156     Visit Number 8    Number of Visits 13    Date for PT Re-Evaluation 10/13/21    PT Start Time 1104    PT Stop Time 1156    PT Time Calculation (min) 52 min    Activity Tolerance Patient tolerated treatment well    Behavior During Therapy [facility] for tasks assessed/performed             Past Medical History:  Diagnosis Date   Asthma 07/31/2021   Breast cancer [facility])    Family history of ovarian cancer    Family history of prostate cancer    GERD (gastroesophageal reflux disease)    History of asthma    has albuterol, uses PRN   History of heart murmur in childhood    History of multiple allergies     Past Surgical History:  Procedure Laterality Date   BREAST LUMPECTOMY WITH RADIOACTIVE SEED AND SENTINEL LYMPH NODE BIOPSY Left 11/19/2020   Procedure: LEFT BREAST LUMPECTOMY X 2 WITH RADIOACTIVE SEED AND SENTINEL LYMPH NODE MAPPING;  Surgeon: Erroll Luna, MD;  Location: [facility];  Service: General;  Laterality: Left;  PECTORAL BLOCK   PORTACATH PLACEMENT Right 06/18/2020   Procedure: INSERTION PORT-A-CATH WITH ULTRASOUND GUIDANCE;  Surgeon: Erroll Luna, MD;  Location: [facility];  Service: General;  Laterality: Right;   RE-EXCISION OF BREAST LUMPECTOMY Left 12/09/2020   Procedure: RE-EXCISION OF LEFT BREAST LUMPECTOMY;  Surgeon: Erroll Luna, MD;  Location: [facility];  Service: General;  Laterality: Left;    There were no vitals filed for this visit.   Subjective Assessment - 09/24/21 1107     Subjective I am still living in the hotel with my  children and still trying to find a place to live. still have some soreness and tingling behind my arm.  I think its from stress.  I still don't do alot of lifting at work over 15 lbs if I don't have to.  If I use the vacuume on the left it does bother me.    Pertinent History Patient was diagnosed on 05/12/2020 with left breast cancer. She underwent neoadjuvant chemotherapy 06/19/2020-09/23/2020 and had a left lumpectomy and sentinel node biopsy (6 negative nodes) on 11/19/2020.  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 Numbness in posterior arm is a little better.    Patient Stated Goals Decrease pain and improve shoulder ROM, decrease swelling    Currently in Pain? Yes    Pain Score 7     Pain Location Arm    Pain Orientation Left    Pain Descriptors / Indicators Heaviness;Pins and needles;Tiring    Pain Type Chronic pain    Pain Onset More than a month ago    Pain Frequency Intermittent    Multiple Pain Sites No                               [facility] Adult PT Treatment/Exercise - 09/24/21 0001       Shoulder Exercises: Standing   Shoulder Elevation Limitations  Elbow ext left 3 # 2 x 10    Other Standing Exercises Jobes flexion and scaption 1#  2x 10    Other Standing Exercises Elbow flexion bilaterally 3 # 2 x 10      Manual Therapy   Soft tissue mobilization to left pectorals, lats in supine with stargazer position and left arm    Myofascial Release MFR to left axillary region,    Passive ROM PROM left shoulder flex, scaption, abd, ER                          PT Long Term Goals - 09/01/21 1159       PT LONG TERM GOAL #1   Title Patient will demonstrate independence in HEP   and that she has regained full function post operatively compared to baselines.    Time 6    Period Weeks    Status On-going    Target Date 10/13/21      PT LONG TERM GOAL #2   Title Patient will increase left shoulder active flexion to >/= 165 degrees for  increased ease reaching.    Baseline 165 today    Time 6    Period Weeks    Status Achieved    Target Date 09/01/21      PT LONG TERM GOAL #3   Title Patient will increase left shoulder active abduction to >/= 165 degrees for increased ease reaching.    Baseline 172    Time 6    Period Weeks    Status Achieved    Target Date 09/01/21      PT LONG TERM GOAL #4   Title Pt will be independent in self management of left UE lymphedema    Baseline SOZO down to 1.9 from 7.6 in early Sept.    Time 6    Period Weeks    Status Achieved    Target Date 09/01/21      PT LONG TERM GOAL #5   Title Patient will report >/= 50% decrease in pain/tightness for increased tolerance of daily tasks.    Baseline 65% better    Time 6    Period Weeks    Status On-going    Target Date 10/13/21                   Plan - 09/24/21 1124     Clinical Impression Statement Pt is under alot of stress trying to find a home for she and her family to move into. She continues with tightness/tenderness at left pecs and lats. Shoulder ROM continues to do well. She continues to fatigue easily with strengthening exercises left greater than right especially with Jobes flexion and scaption.    Personal Factors and Comorbidities Comorbidity 3+    Comorbidities Left breast CA functionally triple neg, chemo, radiation    Stability/Clinical Decision Making Stable/Uncomplicated    Rehab Potential Excellent    PT Frequency 2x / week    PT Duration 6 weeks    PT Treatment/Interventions ADLs/Self Care Home Management;Therapeutic exercise;Patient/family education;Passive range of motion;Manual lymph drainage;Manual techniques;Scar mobilization    PT Next Visit Plan STM to pecs and upper quarter, MFR to axillary cord, PROM, continue to monitor for swelling (SOZO today 4.6 over baseline ), MLD prn, pwer plate stretch for pecs, strength    PT Home Exercise Plan sleeve and gauntlet daily unless fingers swell, MLD daily,  resume shoulder wand and wall slides for  shoulder ROM    Consulted and Agree with Plan of Care Patient             Patient will benefit from skilled therapeutic intervention in order to improve the following deficits and impairments:  Postural dysfunction, Decreased range of motion, Impaired UE functional use, Pain, Decreased knowledge of precautions, Decreased scar mobility, Increased fascial restricitons, Increased edema, Impaired flexibility  Visit Diagnosis: Aftercare following surgery for neoplasm  Stiffness of left shoulder, not elsewhere classified  Abnormal posture  Disorder of the skin and subcutaneous tissue related to radiation, unspecified  Localized edema  Malignant neoplasm of upper-outer quadrant of left breast in female, estrogen receptor positive ([place])     Problem List Patient Active Problem List   Diagnosis Date Noted   Asthma 07/31/2021   Lymphedema of left arm 02/02/2021   Drug-induced neutropenia ([place]) 09/30/2020   Genetic testing 06/26/2020   Family history of ovarian cancer    Family history of prostate cancer    Malignant neoplasm of upper-outer quadrant of left breast in female, estrogen receptor negative ([place]) 06/03/2020    Claris Pong, PT 09/24/2021, 11:57 AM  [facility] @ [place], [place], [address] Phone: [phone]   Fax:  [phone]  Name: Jeanne Evans MRN: [id number] Date of Birth: June 24, 1986

## 2021-09-25 ENCOUNTER — Encounter: Payer: Self-pay | Admitting: General Practice

## 2021-09-28 ENCOUNTER — Ambulatory Visit: Payer: 59 | Admitting: Oncology

## 2021-09-28 ENCOUNTER — Other Ambulatory Visit: Payer: 59

## 2021-09-29 ENCOUNTER — Other Ambulatory Visit: Payer: Self-pay

## 2021-09-29 ENCOUNTER — Ambulatory Visit: Payer: 59

## 2021-09-29 DIAGNOSIS — R293 Abnormal posture: Secondary | ICD-10-CM

## 2021-09-29 DIAGNOSIS — L599 Disorder of the skin and subcutaneous tissue related to radiation, unspecified: Secondary | ICD-10-CM

## 2021-09-29 DIAGNOSIS — Z483 Aftercare following surgery for neoplasm: Secondary | ICD-10-CM

## 2021-09-29 DIAGNOSIS — M25612 Stiffness of left shoulder, not elsewhere classified: Secondary | ICD-10-CM

## 2021-09-29 DIAGNOSIS — R600 Localized edema: Secondary | ICD-10-CM

## 2021-09-29 DIAGNOSIS — Z5112 Encounter for antineoplastic immunotherapy: Secondary | ICD-10-CM | POA: Diagnosis not present

## 2021-09-29 DIAGNOSIS — C50412 Malignant neoplasm of upper-outer quadrant of left female breast: Secondary | ICD-10-CM

## 2021-09-29 DIAGNOSIS — Z170 Estrogen receptor positive status [ER+]: Secondary | ICD-10-CM

## 2021-09-29 NOTE — Therapy (Signed)
[facility] @ [place], [place], [address] Phone: [phone]   Fax:  [phone]  Physical Therapy Treatment  Patient Details  Name: Jeanne Evans MRN: [id number] Date of Birth: 08/18/1986 Referring Provider (PT): Dr. Erroll Luna   Encounter Date: 09/29/2021   PT End of Session - 09/29/21 1148     Visit Number 9    Number of Visits 13    Date for PT Re-Evaluation 10/13/21    PT Start Time 1105    PT Stop Time 1200    PT Time Calculation (min) 55 min    Activity Tolerance Patient tolerated treatment well    Behavior During Therapy [facility] for tasks assessed/performed             Past Medical History:  Diagnosis Date   Asthma 07/31/2021   Breast cancer [facility])    Family history of ovarian cancer    Family history of prostate cancer    GERD (gastroesophageal reflux disease)    History of asthma    has albuterol, uses PRN   History of heart murmur in childhood    History of multiple allergies     Past Surgical History:  Procedure Laterality Date   BREAST LUMPECTOMY WITH RADIOACTIVE SEED AND SENTINEL LYMPH NODE BIOPSY Left 11/19/2020   Procedure: LEFT BREAST LUMPECTOMY X 2 WITH RADIOACTIVE SEED AND SENTINEL LYMPH NODE MAPPING;  Surgeon: Erroll Luna, MD;  Location: [facility];  Service: General;  Laterality: Left;  PECTORAL BLOCK   PORTACATH PLACEMENT Right 06/18/2020   Procedure: INSERTION PORT-A-CATH WITH ULTRASOUND GUIDANCE;  Surgeon: Erroll Luna, MD;  Location: [facility];  Service: General;  Laterality: Right;   RE-EXCISION OF BREAST LUMPECTOMY Left 12/09/2020   Procedure: RE-EXCISION OF LEFT BREAST LUMPECTOMY;  Surgeon: Erroll Luna, MD;  Location: [facility];  Service: General;  Laterality: Left;    There were no vitals filed for this visit.   Subjective Assessment - 09/29/21 1105     Subjective I am sore today in my armpit and to the  lateral breast and just under the breast.   It is tender and achy. My shoulder isn't really hurting. The tingling is in the back of my arm again.    Pertinent History Patient was diagnosed on 05/12/2020 with left breast cancer. She underwent neoadjuvant chemotherapy 06/19/2020-09/23/2020 and had a left lumpectomy and sentinel node biopsy (6 negative nodes) on 11/19/2020.  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 Numbness in posterior arm is a little better.    Patient Stated Goals Decrease pain and improve shoulder ROM, decrease swelling                [facility] PT Assessment - 09/29/21 0001       AROM   Left Shoulder Flexion 162 Degrees    Left Shoulder ABduction 164 Degrees                           [facility] Adult PT Treatment/Exercise - 09/29/21 0001       Manual Therapy   Soft tissue mobilization to left pectorals, lats in supine with stargazer position and left arm    Myofascial Release MFR to left axillary region,    Manual Lymphatic Drainage (MLD) Mld to supraclavicular, right axillary and left axillary LN,Anterior interaxillary pathway, left axillo-inguinal pathway and left breast , retracing all steps and ending with LN/s  Passive ROM PROM left shoulder flex, scaption, abd, ER                          PT Long Term Goals - 09/29/21 1151       PT LONG TERM GOAL #1   Title Patient will demonstrate independence in HEP   and that she has regained full function post operatively compared to baselines.    Time 6    Period Weeks    Status On-going      PT LONG TERM GOAL #2   Title Patient will increase left shoulder active flexion to >/= 165 degrees for increased ease reaching.    Baseline 162 today, regressed    Time 6    Period Weeks    Status On-going      PT LONG TERM GOAL #3   Title Patient will increase left shoulder active abduction to >/= 165 degrees for increased ease reaching.    Baseline 164 today    Time 6    Status  On-going    Target Date 10/13/21      PT LONG TERM GOAL #4   Title Pt will be independent in self management of left UE lymphedema    Time 6    Period Weeks    Status Achieved      PT LONG TERM GOAL #5   Title Patient will report >/= 50% decrease in pain/tightness for increased tolerance of daily tasks.    Baseline 55%    Time 6    Period Weeks    Status Achieved      PT LONG TERM GOAL #6   Title Pt will increase sit to stand in 30 seconds to 12 to indicate an improvement in functional strength    Baseline 8    Time 6    Period Weeks    Status On-going                   Plan - 09/29/21 1203     Clinical Impression Statement Haven't found a place to live yet, but hopeful about a trailer she has found.  Pt continues with tightness and tenderness at left pecs and lats, but also has some lateral breast tenderness also. continued soft tissue mobilization, MFR and PROM. In addition, performed MLD to pts. left breast to decrease soreness.  Checked goals with shoulder ROM more limited today and goals previously achieved are now ongoing.    Personal Factors and Comorbidities Comorbidity 3+    Comorbidities Left breast CA functionally triple neg, chemo, radiation    Stability/Clinical Decision Making Stable/Uncomplicated    Rehab Potential Excellent    PT Frequency 2x / week    PT Duration 6 weeks    PT Treatment/Interventions ADLs/Self Care Home Management;Therapeutic exercise;Patient/family education;Passive range of motion;Manual lymph drainage;Manual techniques;Scar mobilization    PT Next Visit Plan STM to pecs and upper quarter, MFR to axillary cord, PROM, continue to monitor for swelling (SOZO today 4.6 over baseline ), MLD prn, pwer plate stretch for pecs, strength    PT Home Exercise Plan sleeve and gauntlet daily unless fingers swell, MLD daily, resume shoulder wand and wall slides for shoulder ROM    Recommended Other Services put on compression bra to decrease soreness  at lateral breast    Consulted and Agree with Plan of Care Patient             Patient will benefit from skilled  therapeutic intervention in order to improve the following deficits and impairments:  Postural dysfunction, Decreased range of motion, Impaired UE functional use, Pain, Decreased knowledge of precautions, Decreased scar mobility, Increased fascial restricitons, Increased edema, Impaired flexibility  Visit Diagnosis: Aftercare following surgery for neoplasm  Stiffness of left shoulder, not elsewhere classified  Abnormal posture  Disorder of the skin and subcutaneous tissue related to radiation, unspecified  Localized edema  Malignant neoplasm of upper-outer quadrant of left breast in female, estrogen receptor positive ([place])     Problem List Patient Active Problem List   Diagnosis Date Noted   Asthma 07/31/2021   Lymphedema of left arm 02/02/2021   Drug-induced neutropenia ([facility]) 09/30/2020   Genetic testing 06/26/2020   Family history of ovarian cancer    Family history of prostate cancer    Malignant neoplasm of upper-outer quadrant of left breast in female, estrogen receptor negative ([place]) 06/03/2020    Claris Pong, PT 09/29/2021, 12:07 PM  [facility] @ [place], [place], [address] Phone: [phone]   Fax:  [phone]  Name: MARGIT BATTE MRN: [id number] Date of Birth: [birth year]

## 2021-10-09 ENCOUNTER — Other Ambulatory Visit: Payer: 59

## 2021-10-09 ENCOUNTER — Ambulatory Visit: Payer: 59

## 2021-10-13 ENCOUNTER — Other Ambulatory Visit: Payer: Self-pay

## 2021-10-13 ENCOUNTER — Ambulatory Visit: Payer: 59

## 2021-10-13 DIAGNOSIS — M25612 Stiffness of left shoulder, not elsewhere classified: Secondary | ICD-10-CM

## 2021-10-13 DIAGNOSIS — C50412 Malignant neoplasm of upper-outer quadrant of left female breast: Secondary | ICD-10-CM

## 2021-10-13 DIAGNOSIS — Z5112 Encounter for antineoplastic immunotherapy: Secondary | ICD-10-CM | POA: Diagnosis not present

## 2021-10-13 DIAGNOSIS — L599 Disorder of the skin and subcutaneous tissue related to radiation, unspecified: Secondary | ICD-10-CM

## 2021-10-13 DIAGNOSIS — R600 Localized edema: Secondary | ICD-10-CM

## 2021-10-13 DIAGNOSIS — R293 Abnormal posture: Secondary | ICD-10-CM

## 2021-10-13 DIAGNOSIS — Z170 Estrogen receptor positive status [ER+]: Secondary | ICD-10-CM

## 2021-10-13 DIAGNOSIS — Z483 Aftercare following surgery for neoplasm: Secondary | ICD-10-CM

## 2021-10-13 NOTE — Therapy (Signed)
[facility] @ [address] [place], [place], [address] Phone: [phone]   Fax:  [phone]  Physical Therapy Treatment  Patient Details  Name: Jeanne Evans MRN: [id number] Date of Birth: [birth year] Referring Provider (PT): Dr. Erroll Luna   Encounter Date: 10/13/2021   PT End of Session - 10/13/21 1157     Visit Number 10    Number of Visits 22    Date for PT Re-Evaluation 11/24/21    PT Start Time 1122    PT Stop Time 1155    PT Time Calculation (min) 33 min    Activity Tolerance Patient tolerated treatment well    Behavior During Therapy [facility] for tasks assessed/performed             Past Medical History:  Diagnosis Date   Asthma 07/31/2021   Breast cancer [facility])    Family history of ovarian cancer    Family history of prostate cancer    GERD (gastroesophageal reflux disease)    History of asthma    has albuterol, uses PRN   History of heart murmur in childhood    History of multiple allergies     Past Surgical History:  Procedure Laterality Date   BREAST LUMPECTOMY WITH RADIOACTIVE SEED AND SENTINEL LYMPH NODE BIOPSY Left 11/19/2020   Procedure: LEFT BREAST LUMPECTOMY X 2 WITH RADIOACTIVE SEED AND SENTINEL LYMPH NODE MAPPING;  Surgeon: Erroll Luna, MD;  Location: [place];  Service: General;  Laterality: Left;  PECTORAL BLOCK   PORTACATH PLACEMENT Right 06/18/2020   Procedure: INSERTION PORT-A-CATH WITH ULTRASOUND GUIDANCE;  Surgeon: Erroll Luna, MD;  Location: [place];  Service: General;  Laterality: Right;   RE-EXCISION OF BREAST LUMPECTOMY Left 12/09/2020   Procedure: RE-EXCISION OF LEFT BREAST LUMPECTOMY;  Surgeon: Erroll Luna, MD;  Location: [place];  Service: General;  Laterality: Left;    There were no vitals filed for this visit.   Subjective Assessment - 10/13/21 1125     Subjective The coldness still bothers my arm. I had to  wear my sleeve on the cruise ship. I haven't had any swelling in my hand, but the pain still runs from under my arm to my left breast. I am trying to use my arm alot more with work and home activities, but I still can't open a jar. If I try to use the scrub machine at work it bothers me.    Pertinent History Patient was diagnosed on 05/12/2020 with left breast cancer. She underwent neoadjuvant chemotherapy 06/19/2020-09/23/2020 and had a left lumpectomy and sentinel node biopsy (6 negative nodes) on 11/19/2020.  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 Numbness in posterior arm is a little better.    Patient Stated Goals Decrease pain and improve shoulder ROM, decrease swelling                [facility] PT Assessment - 10/13/21 0001       Assessment   Medical Diagnosis s/p left lumpectomy and sentinel node biopsy    Referring Provider (PT) Dr. [name] [place]    Onset Date/Surgical Date 11/19/20      Precautions   Precaution Comments lymphedema risk      Prior Function   Level of Independence Independent      Posture/Postural Control   Posture/Postural Control Postural limitations    Postural Limitations Rounded Shoulders;Forward head      AROM   Left Shoulder  Flexion 166 Degrees    Left Shoulder ABduction 158 Degrees    Left Shoulder External Rotation 87 Degrees      Palpation   Palpation comment tender left lateral breast/axillary region                           [facility] Adult PT Treatment/Exercise - 10/13/21 0001       Shoulder Exercises: Pulleys   Flexion 2 minutes    ABduction 2 minutes                          PT Long Term Goals - 10/13/21 1136       PT LONG TERM GOAL #1   Title Patient will demonstrate independence in HEP   and that she has regained full function post operatively compared to baselines.    Baseline still not doing full work hours or duties    Time 6    Status On-going      PT LONG TERM GOAL #2   Title  Patient will increase left shoulder active flexion to >/= 165 degrees for increased ease reaching.    Time 6    Period Weeks    Status Achieved    Target Date 10/13/21      PT LONG TERM GOAL #3   Title Patient will increase left shoulder active abduction to >/= 165 degrees for increased ease reaching.    Time 6    Period Weeks    Status On-going      PT LONG TERM GOAL #4   Title Pt will be independent in self management of left UE lymphedema    Time 6    Period Weeks    Status Achieved      PT LONG TERM GOAL #5   Title Patient will report >/= 50% decrease in pain/tightness for increased tolerance of daily tasks.    Baseline still has tightness and pain under arm and lateral breast    Time 6    Period Weeks    Status On-going      PT LONG TERM GOAL #6   Title Pt will increase sit to stand in 30 seconds to 12 to indicate an improvement in functional strength    Baseline 8, 9 on 10/13/2021    Time 6    Period Weeks    Status On-going                   Plan - 10/13/21 1200     Clinical Impression Statement Pt continues to struggle with finding a place to live . She has continued tightness in left pecs and lats and some lateral breast tightness/tenderness as well.  She is scheduled for another surgery on December 15.  She has been encouraged to continue with her stretches as often as she can.  Shoulder FLexion ROM is WNL, but abduction is more limited today and is probably contributing to tightness in axilla.  She will benefit from continued PT to progress ROMand strength. Pt was 22 min late today.    Personal Factors and Comorbidities Comorbidity 3+    Comorbidities Left breast CA functionally triple neg, chemo, radiation    Stability/Clinical Decision Making Stable/Uncomplicated    Rehab Potential Good    PT Frequency 2x / week    PT Duration 6 weeks    PT Treatment/Interventions ADLs/Self Care Home Management;Therapeutic exercise;Patient/family education;Passive  range of motion;Manual  lymph drainage;Manual techniques;Scar mobilization    PT Next Visit Plan STM to pecs and upper quarter, MFR to axillary cord, PROM, continue to monitor for swelling (SOZO today 4.6 over baseline ), MLD prn, pwer plate stretch for pecs, strength    PT Home Exercise Plan sleeve and gauntlet daily unless fingers swell, MLD daily, resume shoulder wand and wall slides for shoulder ROM    Consulted and Agree with Plan of Care Patient             Patient will benefit from skilled therapeutic intervention in order to improve the following deficits and impairments:  Postural dysfunction, Decreased range of motion, Impaired UE functional use, Pain, Decreased knowledge of precautions, Decreased scar mobility, Increased fascial restricitons, Increased edema, Impaired flexibility  Visit Diagnosis: Stiffness of left shoulder, not elsewhere classified  Abnormal posture  Localized edema  Aftercare following surgery for neoplasm  Malignant neoplasm of upper-outer quadrant of left breast in female, estrogen receptor positive ([place])  Disorder of the skin and subcutaneous tissue related to radiation, unspecified     Problem List Patient Active Problem List   Diagnosis Date Noted   Asthma 07/31/2021   Lymphedema of left arm 02/02/2021   Drug-induced neutropenia ([place]) 09/30/2020   Genetic testing 06/26/2020   Family history of ovarian cancer    Family history of prostate cancer    Malignant neoplasm of upper-outer quadrant of left breast in female, estrogen receptor negative ([place]) 06/03/2020    Claris Pong, PT 10/13/2021, 12:05 PM  [facility] @ [address] [place], [place], [address] Phone: [phone]   Fax:  [phone]  Name: SUDIE BANDEL MRN: [id number] Date of Birth: 1986/02/23

## 2021-10-14 ENCOUNTER — Other Ambulatory Visit: Payer: Self-pay

## 2021-10-14 ENCOUNTER — Inpatient Hospital Stay: Payer: 59

## 2021-10-14 ENCOUNTER — Encounter: Payer: 59 | Admitting: Surgical

## 2021-10-14 DIAGNOSIS — C50412 Malignant neoplasm of upper-outer quadrant of left female breast: Secondary | ICD-10-CM

## 2021-10-14 DIAGNOSIS — Z5112 Encounter for antineoplastic immunotherapy: Secondary | ICD-10-CM | POA: Diagnosis not present

## 2021-10-14 LAB — CMP (CANCER CENTER ONLY)
ALT: 21 U/L (ref 0–44)
AST: 18 U/L (ref 15–41)
Albumin: 4.6 g/dL (ref 3.5–5.0)
Alkaline Phosphatase: 88 U/L (ref 38–126)
Anion gap: 10 (ref 5–15)
BUN: 14 mg/dL (ref 6–20)
CO2: 25 mmol/L (ref 22–32)
Calcium: 9.9 mg/dL (ref 8.9–10.3)
Chloride: 105 mmol/L (ref 98–111)
Creatinine: 1.03 mg/dL — ABNORMAL HIGH (ref 0.44–1.00)
GFR, Estimated: >60 mL/min (ref >60)
Glucose, Bld: 107 mg/dL — ABNORMAL HIGH (ref 70–99)
Potassium: 4.3 mmol/L (ref 3.5–5.1)
Sodium: 140 mmol/L (ref 135–145)
Total Bilirubin: 0.8 mg/dL (ref 0.3–1.2)
Total Protein: 8.4 g/dL — ABNORMAL HIGH (ref 6.5–8.1)

## 2021-10-14 LAB — CBC WITH DIFFERENTIAL (CANCER CENTER ONLY)
Abs Immature Granulocytes: 0.01 10*3/uL (ref 0.00–0.07)
Basophils Absolute: 0 10*3/uL (ref 0.0–0.1)
Basophils Relative: 1 %
Eosinophils Absolute: 0.4 10*3/uL (ref 0.0–0.5)
Eosinophils Relative: 8 %
HCT: 40.1 % (ref 36.0–46.0)
Hemoglobin: 13.3 g/dL (ref 12.0–15.0)
Immature Granulocytes: 0 %
Lymphocytes Relative: 37 %
Lymphs Abs: 2 10*3/uL (ref 0.7–4.0)
MCH: 28.6 pg (ref 26.0–34.0)
MCHC: 33.2 g/dL (ref 30.0–36.0)
MCV: 86.2 fL (ref 80.0–100.0)
Monocytes Absolute: 0.4 10*3/uL (ref 0.1–1.0)
Monocytes Relative: 7 %
Neutro Abs: 2.7 10*3/uL (ref 1.7–7.7)
Neutrophils Relative %: 47 %
Platelet Count: 297 10*3/uL (ref 150–400)
RBC: 4.65 MIL/uL (ref 3.87–5.11)
RDW: 14.3 % (ref 11.5–15.5)
WBC Count: 5.6 10*3/uL (ref 4.0–10.5)
nRBC: 0 % (ref 0.0–0.2)

## 2021-10-14 LAB — TSH: TSH: 0.707 u[IU]/mL (ref 0.308–3.960)

## 2021-10-14 NOTE — Progress Notes (Incomplete)
Patient ID: Jeanne Evans, female    DOB: [birth year], 35 y.o.   MRN: [id number]  No chief complaint on file.   No diagnosis found.   History of Present Illness: Jeanne Evans is a 35 y.o.  female  with a history of macromastia.  She presents for preoperative evaluation for upcoming procedure, Bilateral Breast Reduction ***, scheduled for *** with Dr.  {EYCXK:[id number]::"UDJS","HFWYOVZCHY"}  The patient {HAS HAS IFO:[id number]} had problems with anesthesia. ***  Summary of Previous Visit: ***  Estimated excess breast tissue to be removed at time of surgery: *** grams  Job: ***  PMH Significant for: ***   Past Medical History: Allergies: Allergies  Allergen Reactions   Sulfa Antibiotics Hives    Current Medications:  Current Outpatient Medications:    Acetaminophen (TYLENOL) 325 MG CAPS, Take by mouth. (Patient not taking: Reported on 07/31/2021), Disp: , Rfl:    albuterol (VENTOLIN HFA) 108 (90 Base) MCG/ACT inhaler, Inhale 2 puffs into the lungs every 6 (six) hours as needed for wheezing or shortness of breath., Disp: 8 g, Rfl: 6   fluticasone furoate-vilanterol (BREO ELLIPTA) 100-25 MCG/INH AEPB, Inhale 1 puff into the lungs daily., Disp: 60 each, Rfl: 5   gabapentin (NEURONTIN) 300 MG capsule, Take 1 capsule (300 mg total) by mouth at bedtime., Disp: 90 capsule, Rfl: 4   ibuprofen (ADVIL) 800 MG tablet, Take 1 tablet (800 mg total) by mouth every 8 (eight) hours as needed. (Patient not taking: Reported on 07/31/2021), Disp: 30 tablet, Rfl: 0   montelukast (SINGULAIR) 10 MG tablet, Take 1 tablet (10 mg total) by mouth at bedtime., Disp: 30 tablet, Rfl: 11   omeprazole (PRILOSEC) 40 MG capsule, Take 1 capsule (40 mg total) by mouth at bedtime., Disp: 30 capsule, Rfl: 3   promethazine-codeine (PHENERGAN WITH CODEINE) 6.25-10 MG/5ML syrup, Take 5 mLs by mouth every 8 (eight) hours as needed for cough. (Patient not taking: Reported on 07/31/2021), Disp: 120 mL, Rfl:  0   valACYclovir (VALTREX) 500 MG tablet, Take 500 mg by mouth 2 (two) times daily., Disp: , Rfl:    venlafaxine XR (EFFEXOR-XR) 75 MG 24 hr capsule, Take 1 capsule (75 mg total) by mouth daily with breakfast., Disp: 90 capsule, Rfl: 4  Past Medical Problems: Past Medical History:  Diagnosis Date   Asthma 07/31/2021   Breast cancer ([place])    Family history of ovarian cancer    Family history of prostate cancer    GERD (gastroesophageal reflux disease)    History of asthma    has albuterol, uses PRN   History of heart murmur in childhood    History of multiple allergies     Past Surgical History: Past Surgical History:  Procedure Laterality Date   BREAST LUMPECTOMY WITH RADIOACTIVE SEED AND SENTINEL LYMPH NODE BIOPSY Left 11/19/2020   Procedure: LEFT BREAST LUMPECTOMY X 2 WITH RADIOACTIVE SEED AND SENTINEL LYMPH NODE MAPPING;  Surgeon: Erroll Luna, MD;  Location: [place];  Service: General;  Laterality: Left;  PECTORAL BLOCK   PORTACATH PLACEMENT Right 06/18/2020   Procedure: INSERTION PORT-A-CATH WITH ULTRASOUND GUIDANCE;  Surgeon: Erroll Luna, MD;  Location: [place];  Service: General;  Laterality: Right;   RE-EXCISION OF BREAST LUMPECTOMY Left 12/09/2020   Procedure: RE-EXCISION OF LEFT BREAST LUMPECTOMY;  Surgeon: Erroll Luna, MD;  Location: [place];  Service: General;  Laterality: Left;    Social History: Social History   Socioeconomic History   Marital  status: Single    Spouse name: Not on file   Number of children: Not on file   Years of education: Not on file   Highest education level: Not on file  Occupational History   Not on file  Tobacco Use   Smoking status: Never   Smokeless tobacco: Never  Substance and Sexual Activity   Alcohol use: Yes    Comment: occas   Drug use: Yes    Types: Marijuana   Sexual activity: Not on file  Other Topics Concern   Not on file  Social  History Narrative   Not on file   Social Determinants of Health   Financial Resource Strain: High Risk   Difficulty of Paying Living Expenses: Very hard  Food Insecurity: Food Insecurity Present   Worried About Running Out of Food in the Last Year: Sometimes true   Ran Out of Food in the Last Year: Sometimes true  Transportation Needs: No Transportation Needs   Lack of Transportation (Medical): No   Lack of Transportation (Non-Medical): No  Physical Activity: Not on file  Stress: Stress Concern Present   Feeling of Stress : Very much  Social Connections: Socially Isolated   Frequency of Communication with Friends and Family: More than three times a week   Frequency of Social Gatherings with Friends and Family: Once a week   Attends Religious Services: Never   Marine scientist or Organizations: No   Attends Music therapist: Never   Marital Status: Never married  Human resources officer Violence: Not on file    Family History: Family History  Problem Relation Age of Onset   Lung cancer Maternal Uncle        dx late 6s   Prostate cancer Paternal Uncle        dx late 57s   Ovarian cancer Paternal Grandmother        dx 8s   Prostate cancer Paternal Grandfather        dx 79s   Prostate cancer Paternal Uncle        dx early 6s    Review of Systems: ROS  Physical Exam: Vital Signs There were no vitals taken for this visit.  Physical Exam *** Constitutional:      General: Not in acute distress.    Appearance: Normal appearance. Not ill-appearing.  HENT:     Head: Normocephalic and atraumatic.  Eyes:     Pupils: Pupils are equal, round Neck:     Musculoskeletal: Normal range of motion.  Cardiovascular:     Rate and Rhythm: Normal rate    Pulses: Normal pulses.  Pulmonary:     Effort: Pulmonary effort is normal. No respiratory distress.  Musculoskeletal: Normal range of motion.  Skin:    General: Skin is warm and dry.      Findings: No erythema or rash.  Neurological:     General: No focal deficit present.     Mental Status: Alert and oriented to person, place, and time. Mental status is at baseline.     Motor: No weakness.  Psychiatric:        Mood and Affect: Mood normal.        Behavior: Behavior normal.    Assessment/Plan: The patient is scheduled for bilateral breast reduction with Dr. Donne Hazel.  Risks, benefits, and alternatives of procedure discussed, questions answered and consent obtained.    Smoking Status: ***; Counseling Given? *** Last Mammogram: ***; Results: ***  Caprini Score: ***; Risk Factors  include: ***, BMI *** 25, and length of planned surgery. Recommendation for mechanical *** pharmacological prophylaxis. Encourage early ambulation.   Pictures obtained: @consult ***  Post-op Rx sent to pharmacy: {Blank:[id number]::"Norco, Zofran, Keflex","Norco, Zofran"}  Patient was provided with the breast reduction and General Surgical Risk consent document and Pain Medication Agreement prior to their appointment.  They had adequate time to read through the risk consent documents and Pain Medication Agreement. We also discussed them in person together during this preop appointment. All of their questions were answered to their satisfaction.  Recommended calling if they have any further questions.  Risk consent form and Pain Medication Agreement to be scanned into patient's chart.  The risk that can be encountered with breast reduction were discussed and include the following but not limited to these:  Breast asymmetry, fluid accumulation, firmness of the breast, inability to breast feed, loss of nipple or areola, skin loss, decrease or no nipple sensation, fat necrosis of the breast tissue, bleeding, infection, healing delay.  There are risks of anesthesia, changes to skin sensation and injury to nerves or blood vessels.  The muscle can be temporarily or permanently  injured.  You may have an allergic reaction to tape, suture, glue, blood products which can result in skin discoloration, swelling, pain, skin lesions, poor healing.  Any of these can lead to the need for revisonal surgery or stage procedures.  A reduction has potential to interfere with diagnostic procedures.  Nipple or breast piercing can increase risks of infection.  This procedure is best done when the breast is fully developed.  Changes in the breast will continue to occur over time.  Pregnancy can alter the outcomes of previous breast reduction surgery, weight gain and weigh loss can also effect the long term appearance.   Lipo?***  Electronically signed by: Carola Rhine Anderia Lorenzo, PA-C 10/14/2021 10:33 AM

## 2021-10-15 ENCOUNTER — Ambulatory Visit: Payer: 59

## 2021-10-15 ENCOUNTER — Inpatient Hospital Stay: Payer: 59 | Attending: Oncology

## 2021-10-15 ENCOUNTER — Inpatient Hospital Stay: Payer: 59

## 2021-10-15 ENCOUNTER — Other Ambulatory Visit: Payer: Self-pay

## 2021-10-15 VITALS — BP 124/81 | HR 75 | Temp 98.7°F | Resp 17 | Wt 184.5 lb

## 2021-10-15 DIAGNOSIS — C50412 Malignant neoplasm of upper-outer quadrant of left female breast: Secondary | ICD-10-CM | POA: Diagnosis present

## 2021-10-15 DIAGNOSIS — Z170 Estrogen receptor positive status [ER+]: Secondary | ICD-10-CM | POA: Diagnosis not present

## 2021-10-15 DIAGNOSIS — Z23 Encounter for immunization: Secondary | ICD-10-CM | POA: Diagnosis not present

## 2021-10-15 DIAGNOSIS — Z5112 Encounter for antineoplastic immunotherapy: Secondary | ICD-10-CM | POA: Insufficient documentation

## 2021-10-15 DIAGNOSIS — Z79899 Other long term (current) drug therapy: Secondary | ICD-10-CM | POA: Diagnosis not present

## 2021-10-15 MED ORDER — INFLUENZA VAC SPLIT QUAD 0.5 ML IM SUSY: 0.5000 mL | PREFILLED_SYRINGE | Freq: Once | INTRAMUSCULAR | Status: DC

## 2021-10-15 MED ORDER — SODIUM CHLORIDE 0.9% FLUSH
10.0000 mL | INTRAVENOUS | Status: DC | PRN
Start: 2021-10-15 — End: 2021-10-15
  Administered 2021-10-15: 10 mL

## 2021-10-15 MED ORDER — SODIUM CHLORIDE 0.9 % IV SOLN: Freq: Once | INTRAVENOUS | Status: AC

## 2021-10-15 MED ORDER — SODIUM CHLORIDE 0.9 % IV SOLN
200.0000 mg | Freq: Once | INTRAVENOUS | Status: AC
  Administered 2021-10-15: 200 mg via INTRAVENOUS
  Filled 2021-10-15: qty 8

## 2021-10-15 MED ORDER — INFLUENZA VAC SPLIT QUAD 0.5 ML IM SUSY
0.5000 mL | PREFILLED_SYRINGE | Freq: Once | INTRAMUSCULAR | Status: AC
  Administered 2021-10-15: 0.5 mL via INTRAMUSCULAR
  Filled 2021-10-15: qty 0.5

## 2021-10-15 MED ORDER — HEPARIN SOD (PORK) LOCK FLUSH 100 UNIT/ML IV SOLN
500.0000 [IU] | Freq: Once | INTRAVENOUS | Status: AC | PRN
  Administered 2021-10-15: 500 [IU]

## 2021-10-15 NOTE — Patient Instructions (Signed)
[facility]  Discharge Instructions: Thank you for choosing [facility] to provide your oncology and hematology care.   If you have a lab appointment with the [facility], please go directly to the [facility] and check in at the registration area.   Wear comfortable clothing and clothing appropriate for easy access to any Portacath or PICC line.   We strive to give you quality time with your provider. You may need to reschedule your appointment if you arrive late (15 or more minutes).  Arriving late affects you and other patients whose appointments are after yours.  Also, if you miss three or more appointments without notifying the office, you may be dismissed from the clinic at the provider's discretion.      For prescription refill requests, have your pharmacy contact our office and allow 72 hours for refills to be completed.    Today you received the following chemotherapy and/or immunotherapy agents: Keytruda      To help prevent nausea and vomiting after your treatment, we encourage you to take your nausea medication as directed.  BELOW ARE SYMPTOMS THAT SHOULD BE REPORTED IMMEDIATELY: *FEVER GREATER THAN 100.4 F (38 C) OR HIGHER *CHILLS OR SWEATING *NAUSEA AND VOMITING THAT IS NOT CONTROLLED WITH YOUR NAUSEA MEDICATION *UNUSUAL SHORTNESS OF BREATH *UNUSUAL BRUISING OR BLEEDING *URINARY PROBLEMS (pain or burning when urinating, or frequent urination) *BOWEL PROBLEMS (unusual diarrhea, constipation, pain near the anus) TENDERNESS IN MOUTH AND THROAT WITH OR WITHOUT PRESENCE OF ULCERS (sore throat, sores in mouth, or a toothache) UNUSUAL RASH, SWELLING OR PAIN  UNUSUAL VAGINAL DISCHARGE OR ITCHING   Items with * indicate a potential emergency and should be followed up as soon as possible or go to the Emergency Department if any problems should occur.  Please show the CHEMOTHERAPY ALERT CARD or IMMUNOTHERAPY ALERT CARD at check-in to  the Emergency Department and triage nurse.  Should you have questions after your visit or need to cancel or reschedule your appointment, please contact [facility]  Dept: [phone]  and follow the prompts.  Office hours are 8:00 a.m. to 4:30 p.m. Monday - Friday. Please note that voicemails left after 4:00 p.m. may not be returned until the following business day.  We are closed weekends and major holidays. You have access to a nurse at all times for urgent questions. Please call the main number to the clinic Dept: [phone] and follow the prompts.   For any non-urgent questions, you may also contact your provider using MyChart. We now offer e-Visits for anyone 28 and older to request care online for non-urgent symptoms. For details visit [email or web address].   Also download the MyChart app! Go to the app store, search "MyChart", open the app, select [facility], and log in with your MyChart username and password.  Due to Covid, a mask is required upon entering the hospital/clinic. If you do not have a mask, one will be given to you upon arrival. For doctor visits, patients may have 1 support person aged 55 or older with them. For treatment visits, patients cannot have anyone with them due to current Covid guidelines and our immunocompromised population.   Influenza (Flu) Vaccine (Inactivated or Recombinant): What You Need to Know 1. Why get vaccinated? Influenza vaccine can prevent influenza (flu). Flu is a contagious disease that spreads around the [place] every year, usually between October and May. Anyone can get the flu, but it is more dangerous  for some people. Infants and young children, people 76 years and older, pregnant people, and people with certain health conditions or a weakened immune system are at greatest risk of flu complications. Pneumonia, bronchitis, sinus infections, and ear infections are examples of flu-related complications. If you  have a medical condition, such as heart disease, cancer, or diabetes, flu can make it worse. Flu can cause fever and chills, sore throat, muscle aches, fatigue, cough, headache, and runny or stuffy nose. Some people may have vomiting and diarrhea, though this is more common in children than adults. In an average year, thousands of people in the [place] die from flu, and many more are hospitalized. Flu vaccine prevents millions of illnesses and flu-related visits to the doctor each year. 2. Influenza vaccines CDC recommends everyone 6 months and older get vaccinated every flu season. Children 6 months through 51 years of age may need 2 doses during a single flu season. Everyone else needs only 1 dose each flu season. It takes about 2 weeks for protection to develop after vaccination. There are many flu viruses, and they are always changing. Each year a new flu vaccine is made to protect against the influenza viruses believed to be likely to cause disease in the upcoming flu season. Even when the vaccine doesn't exactly match these viruses, it may still provide some protection. Influenza vaccine does not cause flu. Influenza vaccine may be given at the same time as other vaccines. 3. Talk with your health care provider Tell your vaccination provider if the person getting the vaccine: Has had an allergic reaction after a previous dose of influenza vaccine, or has any severe, life-threatening allergies Has ever had Guillain-Barr Syndrome (also called "GBS") In some cases, your health care provider may decide to postpone influenza vaccination until a future visit. Influenza vaccine can be administered at any time during pregnancy. People who are or will be pregnant during influenza season should receive inactivated influenza vaccine. People with minor illnesses, such as a cold, may be vaccinated. People who are moderately or severely ill should usually wait until they recover before getting influenza  vaccine. Your health care provider can give you more information. 4. Risks of a vaccine reaction Soreness, redness, and swelling where the shot is given, fever, muscle aches, and headache can happen after influenza vaccination. There may be a very small increased risk of Guillain-Barr Syndrome (GBS) after inactivated influenza vaccine (the flu shot). Young children who get the flu shot along with pneumococcal vaccine (PCV13) and/or DTaP vaccine at the same time might be slightly more likely to have a seizure caused by fever. Tell your health care provider if a child who is getting flu vaccine has ever had a seizure. People sometimes faint after medical procedures, including vaccination. Tell your provider if you feel dizzy or have vision changes or ringing in the ears. As with any medicine, there is a very remote chance of a vaccine causing a severe allergic reaction, other serious injury, or death. 5. What if there is a serious problem? An allergic reaction could occur after the vaccinated person leaves the clinic. If you see signs of a severe allergic reaction (hives, swelling of the face and throat, difficulty breathing, a fast heartbeat, dizziness, or weakness), call 9-1-1 and get the person to the nearest hospital. For other signs that concern you, call your health care provider. Adverse reactions should be reported to the Vaccine Adverse Event Reporting System (VAERS). Your health care provider will usually file this  report, or you can do it yourself. Visit the VAERS website at www.vaers.SamedayNews.es or call (607)100-0418. VAERS is only for reporting reactions, and VAERS staff members do not give medical advice. 6. The National Vaccine Injury Compensation Program The [employer] Vaccine Injury Compensation Program (VICP) is a federal program that was created to compensate people who may have been injured by certain vaccines. Claims regarding alleged injury or death due to vaccination have a time limit  for filing, which may be as short as two years. Visit the VICP website at GoldCloset.com.ee or call 252 484 3105 to learn about the program and about filing a claim. 7. How can I learn more? Ask your health care provider. Call your local or state health department. Visit the website of the Food and Drug Administration (FDA) for vaccine package inserts and additional information at TraderRating.uy. Contact the Centers for Disease Control and Prevention (CDC): Call 651-877-3813 (1-800-CDC-INFO) or Visit CDC's website at https://gibson.com/. Vaccine Information Statement Inactivated Influenza Vaccine (06/20/2020) This information is not intended to replace advice given to you by your health care provider. Make sure you discuss any questions you have with your health care provider. Document Revised: 07/23/2021 Document Reviewed: 07/23/2021 Elsevier Patient Education  2022 [employer].

## 2021-10-15 NOTE — Progress Notes (Signed)
Labs collected yesterday- PAC accessed

## 2021-10-19 NOTE — H&P (View-Only) (Signed)
Patient ID: Jeanne Evans, female    DOB: Apr 07, 1986, 35 y.o.   MRN: [id number]  Chief Complaint  Patient presents with   Pre-op Exam       ICD-10-CM   1. Malignant neoplasm of upper-outer quadrant of left breast in female, estrogen receptor negative ([facility])  C50.412    Z17.1        History of Present Illness: Jeanne Evans is a 35 y.o.  female  with a history of left-sided breast conservation surgery with postoperative breast asymmetry.  She presents for preoperative evaluation for upcoming procedure, right-sided breast reduction with bilateral fat grafting, scheduled for 10/27/2021 with Dr. Claudia Desanctis.  The patient has not had problems with anesthesia.  She has the Mirena IUD, no estrogen.  She denies any personal or family history of blood clots or clotting disorder.  She has an allergy to sulfa medications, but no other known drug allergies.  Patient smokes marijuana, but denies any nicotine use.  She will make an effort to mitigate her marijuana and any alcohol use perioperatively.  She denies any recent illness or hospitalization.  Patient was seen by cardiology a few months ago, but reports that she was cardiac cleared and with reassuring echocardiogram.  Preserved EF.  Summary of Previous Visit: Patient was seen for consult 07/30/2021.  She had left-sided lumpectomy 11/2020 and radiation that concluded 02/2021.  No diabetes or tobacco use.  Left nipple is 2 to 3 cm higher than the right side.  She was also noted to be fuller on the left superiorly compared to the right.  After discussion, plan is for right-sided reduction followed by fat grafting to bilateral breasts.  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  Physically demanding.  PMH Significant for: Left-sided breast cancer.   Past Medical History: Allergies: Allergies  Allergen Reactions   Sulfa Antibiotics Hives    Current Medications:  Current Outpatient Medications:    Acetaminophen (TYLENOL)  325 MG CAPS, Take by mouth., Disp: , Rfl:    albuterol (VENTOLIN HFA) 108 (90 Base) MCG/ACT inhaler, Inhale 2 puffs into the lungs every 6 (six) hours as needed for wheezing or shortness of breath., Disp: 8 g, Rfl: 6   fluticasone furoate-vilanterol (BREO ELLIPTA) 100-25 MCG/INH AEPB, Inhale 1 puff into the lungs daily., Disp: 60 each, Rfl: 5   gabapentin (NEURONTIN) 300 MG capsule, Take 1 capsule (300 mg total) by mouth at bedtime., Disp: 90 capsule, Rfl: 4   ibuprofen (ADVIL) 800 MG tablet, Take 1 tablet (800 mg total) by mouth every 8 (eight) hours as needed., Disp: 30 tablet, Rfl: 0   montelukast (SINGULAIR) 10 MG tablet, Take 1 tablet (10 mg total) by mouth at bedtime., Disp: 30 tablet, Rfl: 11   omeprazole (PRILOSEC) 40 MG capsule, Take 1 capsule (40 mg total) by mouth at bedtime., Disp: 30 capsule, Rfl: 3   promethazine-codeine (PHENERGAN WITH CODEINE) 6.25-10 MG/5ML syrup, Take 5 mLs by mouth every 8 (eight) hours as needed for cough., Disp: 120 mL, Rfl: 0   valACYclovir (VALTREX) 500 MG tablet, Take 500 mg by mouth 2 (two) times daily., Disp: , Rfl:    venlafaxine XR (EFFEXOR-XR) 75 MG 24 hr capsule, Take 1 capsule (75 mg total) by mouth daily with breakfast., Disp: 90 capsule, Rfl: 4  Past Medical Problems: Past Medical History:  Diagnosis Date   Asthma 07/31/2021   Breast cancer ([facility])    Family history of ovarian cancer    Family history  of prostate cancer    GERD (gastroesophageal reflux disease)    History of asthma    has albuterol, uses PRN   History of heart murmur in childhood    History of multiple allergies     Past Surgical History: Past Surgical History:  Procedure Laterality Date   BREAST LUMPECTOMY WITH RADIOACTIVE SEED AND SENTINEL LYMPH NODE BIOPSY Left 11/19/2020   Procedure: LEFT BREAST LUMPECTOMY X 2 WITH RADIOACTIVE SEED AND SENTINEL LYMPH NODE MAPPING;  Surgeon: Erroll Luna, MD;  Location: [facility];  Service: General;  Laterality: Left;   PECTORAL BLOCK   PORTACATH PLACEMENT Right 06/18/2020   Procedure: INSERTION PORT-A-CATH WITH ULTRASOUND GUIDANCE;  Surgeon: Erroll Luna, MD;  Location: [facility];  Service: General;  Laterality: Right;   RE-EXCISION OF BREAST LUMPECTOMY Left 12/09/2020   Procedure: RE-EXCISION OF LEFT BREAST LUMPECTOMY;  Surgeon: Erroll Luna, MD;  Location: [facility];  Service: General;  Laterality: Left;    Social History: Social History   Socioeconomic History   Marital status: Single    Spouse name: Not on file   Number of children: Not on file   Years of education: Not on file   Highest education level: Not on file  Occupational History   Not on file  Tobacco Use   Smoking status: Never   Smokeless tobacco: Never  Substance and Sexual Activity   Alcohol use: Yes    Comment: occas   Drug use: Yes    Types: Marijuana   Sexual activity: Not on file  Other Topics Concern   Not on file  Social History Narrative   Not on file   Social Determinants of Health   Financial Resource Strain: High Risk   Difficulty of Paying Living Expenses: Very hard  Food Insecurity: Food Insecurity Present   Worried About Running Out of Food in the Last Year: Sometimes true   Ran Out of Food in the Last Year: Sometimes true  Transportation Needs: No Transportation Needs   Lack of Transportation (Medical): No   Lack of Transportation (Non-Medical): No  Physical Activity: Not on file  Stress: Stress Concern Present   Feeling of Stress : Very much  Social Connections: Socially Isolated   Frequency of Communication with Friends and Family: More than three times a week   Frequency of Social Gatherings with Friends and Family: Once a week   Attends Religious Services: Never   Marine scientist or Organizations: No   Attends Music therapist: Never   Marital Status: Never married  Human resources officer Violence: Not on file    Family History: Family History   Problem Relation Age of Onset   Lung cancer Maternal Uncle        dx late 16s   Prostate cancer Paternal Uncle        dx late 71s   Ovarian cancer Paternal Grandmother        dx 70s   Prostate cancer Paternal Grandfather        dx 90s   Prostate cancer Paternal Uncle        dx early 66s    Review of Systems: ROS Denies recent illness, infection, or rash.  Physical Exam: Vital Signs BP 137/87 (BP Location: Right Arm, Patient Position: Sitting, Cuff Size: Large)   Pulse 77   Ht 5\' 3"  (1.6 m)   Wt 185 lb 3.2 oz (84 kg)   SpO2 100%   BMI 32.81 kg/m  Physical Exam Constitutional:      General: Not in acute distress.    Appearance: Normal appearance. Not ill-appearing.  HENT:     Head: Normocephalic and atraumatic.  Eyes:     Pupils: Pupils are equal, round Neck:     Musculoskeletal: Normal range of motion.  Cardiovascular:     Rate and Rhythm: Normal rate    Pulses: Normal pulses.  Pulmonary:     Effort: Pulmonary effort is normal. No respiratory distress.  Abdominal:     General: Abdomen is flat. There is no distension.  Musculoskeletal: Normal range of motion.  No lower extremity swelling or edema appreciated on exam.  No varicosities noted.  Compression stockings.  Ambulatory. Skin:    General: Skin is warm and dry.     Findings: No erythema or rash.  Neurological:     General: No focal deficit present.     Mental Status: Alert and oriented to person, place, and time. Mental status is at baseline.     Motor: No weakness.  Psychiatric:        Mood and Affect: Mood normal.        Behavior: Behavior normal.    Assessment/Plan: The patient is scheduled for right-sided breast reduction with bilateral fat grafting 10/27/2021 with Dr. Claudia Desanctis.  Risks, benefits, and alternatives of procedure discussed, questions answered and consent obtained.    Smoking Status: Daily marijuana use, denies any nicotine. Last Mammogram: 04/2021; Results: BI-RADS Category 2,  benign.  Caprini Score: 5; Risk Factors include: History of malignancy, BMI greater than 25, and length of planned surgery. Recommendation for mechanical prophylaxis. Encourage early ambulation.   Pictures obtained: 07/30/2021  Post-op Rx sent to pharmacy: Norco, Zofran.  Patient was provided with the General Surgical Risk consent document and Pain Medication Agreement prior to their appointment.  They had adequate time to read through the risk consent documents and Pain Medication Agreement. We also discussed them in person together during this preop appointment. All of their questions were answered to their satisfaction.  Recommended calling if they have any further questions.  Risk consent form and Pain Medication Agreement to be scanned into patient's chart.  The risk that can be encountered with breast reduction were discussed and include the following but not limited to these:  Breast asymmetry, fluid accumulation, firmness of the breast, inability to breast feed, loss of nipple or areola, skin loss, decrease or no nipple sensation, fat necrosis of the breast tissue, bleeding, infection, healing delay.  There are risks of anesthesia, changes to skin sensation and injury to nerves or blood vessels.  The muscle can be temporarily or permanently injured.  You may have an allergic reaction to tape, suture, glue, blood products which can result in skin discoloration, swelling, pain, skin lesions, poor healing.  Any of these can lead to the need for revisonal surgery or stage procedures.  A reduction has potential to interfere with diagnostic procedures.  Nipple or breast piercing can increase risks of infection.  This procedure is best done when the breast is fully developed.  Changes in the breast will continue to occur over time.  Pregnancy can alter the outcomes of previous breast reduction surgery, weight gain and weigh loss can also effect the long term appearance.   The risks that can be  encountered with and after liposuction were discussed and include the following but no limited to these: Asymmetry, fluid accumulation, firmness of the area, fat necrosis with death of fat tissue, bleeding, infection,  delayed healing, anesthesia risks, skin sensation changes, injury to structures including nerves, blood vessels, and muscles which may be temporary or permanent, allergies to tape, suture materials and glues, blood products, topical preparations or injected agents, skin and contour irregularities, skin discoloration and swelling, deep vein thrombosis, cardiac and pulmonary complications, pain, which may persist, persistent pain, recurrence of the lesion, poor healing of the incision, possible need for revisional surgery or staged procedures. Thiere can also be persistent swelling, poor wound healing, rippling or loose skin, worsening of cellulite, swelling, and thermal burn or heat injury from ultrasound with the ultrasound-assisted lipoplasty technique. Any change in weight fluctuations can alter the outcome.   Electronically signed by: Krista Blue, PA-C 10/21/2021 9:38 AM

## 2021-10-19 NOTE — Progress Notes (Signed)
Patient ID: Jeanne Evans, female    DOB: July 24, 1986, 35 y.o.   MRN: [id number]  Chief Complaint  Patient presents with   Pre-op Exam       ICD-10-CM   1. Malignant neoplasm of upper-outer quadrant of left breast in female, estrogen receptor negative ([place])  C50.412    Z17.1        History of Present Illness: Jeanne Evans is a 35 y.o.  female  with a history of left-sided breast conservation surgery with postoperative breast asymmetry.  She presents for preoperative evaluation for upcoming procedure, right-sided breast reduction with bilateral fat grafting, scheduled for 10/27/2021 with Dr. Claudia Desanctis.  The patient has not had problems with anesthesia.  She has the Mirena IUD, no estrogen.  She denies any personal or family history of blood clots or clotting disorder.  She has an allergy to sulfa medications, but no other known drug allergies.  Patient smokes marijuana, but denies any nicotine use.  She will make an effort to mitigate her marijuana and any alcohol use perioperatively.  She denies any recent illness or hospitalization.  Patient was seen by cardiology a few months ago, but reports that she was cardiac cleared and with reassuring echocardiogram.  Preserved EF.  Summary of Previous Visit: Patient was seen for consult 07/30/2021.  She had left-sided lumpectomy 11/2020 and radiation that concluded 02/2021.  No diabetes or tobacco use.  Left nipple is 2 to 3 cm higher than the right side.  She was also noted to be fuller on the left superiorly compared to the right.  After discussion, plan is for right-sided reduction followed by fat grafting to bilateral breasts.  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  Physically demanding.  PMH Significant for: Left-sided breast cancer.   Past Medical History: Allergies: Allergies  Allergen Reactions   Sulfa Antibiotics Hives    Current Medications:  Current Outpatient Medications:    Acetaminophen (TYLENOL)  325 MG CAPS, Take by mouth., Disp: , Rfl:    albuterol (VENTOLIN HFA) 108 (90 Base) MCG/ACT inhaler, Inhale 2 puffs into the lungs every 6 (six) hours as needed for wheezing or shortness of breath., Disp: 8 g, Rfl: 6   fluticasone furoate-vilanterol (BREO ELLIPTA) 100-25 MCG/INH AEPB, Inhale 1 puff into the lungs daily., Disp: 60 each, Rfl: 5   gabapentin (NEURONTIN) 300 MG capsule, Take 1 capsule (300 mg total) by mouth at bedtime., Disp: 90 capsule, Rfl: 4   ibuprofen (ADVIL) 800 MG tablet, Take 1 tablet (800 mg total) by mouth every 8 (eight) hours as needed., Disp: 30 tablet, Rfl: 0   montelukast (SINGULAIR) 10 MG tablet, Take 1 tablet (10 mg total) by mouth at bedtime., Disp: 30 tablet, Rfl: 11   omeprazole (PRILOSEC) 40 MG capsule, Take 1 capsule (40 mg total) by mouth at bedtime., Disp: 30 capsule, Rfl: 3   promethazine-codeine (PHENERGAN WITH CODEINE) 6.25-10 MG/5ML syrup, Take 5 mLs by mouth every 8 (eight) hours as needed for cough., Disp: 120 mL, Rfl: 0   valACYclovir (VALTREX) 500 MG tablet, Take 500 mg by mouth 2 (two) times daily., Disp: , Rfl:    venlafaxine XR (EFFEXOR-XR) 75 MG 24 hr capsule, Take 1 capsule (75 mg total) by mouth daily with breakfast., Disp: 90 capsule, Rfl: 4  Past Medical Problems: Past Medical History:  Diagnosis Date   Asthma 07/31/2021   Breast cancer ([place])    Family history of ovarian cancer    Family history  of prostate cancer    GERD (gastroesophageal reflux disease)    History of asthma    has albuterol, uses PRN   History of heart murmur in childhood    History of multiple allergies     Past Surgical History: Past Surgical History:  Procedure Laterality Date   BREAST LUMPECTOMY WITH RADIOACTIVE SEED AND SENTINEL LYMPH NODE BIOPSY Left 11/19/2020   Procedure: LEFT BREAST LUMPECTOMY X 2 WITH RADIOACTIVE SEED AND SENTINEL LYMPH NODE MAPPING;  Surgeon: Erroll Luna, MD;  Location: [facility];  Service: General;  Laterality: Left;   PECTORAL BLOCK   PORTACATH PLACEMENT Right 06/18/2020   Procedure: INSERTION PORT-A-CATH WITH ULTRASOUND GUIDANCE;  Surgeon: Erroll Luna, MD;  Location: [facility];  Service: General;  Laterality: Right;   RE-EXCISION OF BREAST LUMPECTOMY Left 12/09/2020   Procedure: RE-EXCISION OF LEFT BREAST LUMPECTOMY;  Surgeon: Erroll Luna, MD;  Location: [facility];  Service: General;  Laterality: Left;    Social History: Social History   Socioeconomic History   Marital status: Single    Spouse name: Not on file   Number of children: Not on file   Years of education: Not on file   Highest education level: Not on file  Occupational History   Not on file  Tobacco Use   Smoking status: Never   Smokeless tobacco: Never  Substance and Sexual Activity   Alcohol use: Yes    Comment: occas   Drug use: Yes    Types: Marijuana   Sexual activity: Not on file  Other Topics Concern   Not on file  Social History Narrative   Not on file   Social Determinants of Health   Financial Resource Strain: High Risk   Difficulty of Paying Living Expenses: Very hard  Food Insecurity: Food Insecurity Present   Worried About Running Out of Food in the Last Year: Sometimes true   Ran Out of Food in the Last Year: Sometimes true  Transportation Needs: No Transportation Needs   Lack of Transportation (Medical): No   Lack of Transportation (Non-Medical): No  Physical Activity: Not on file  Stress: Stress Concern Present   Feeling of Stress : Very much  Social Connections: Socially Isolated   Frequency of Communication with Friends and Family: More than three times a week   Frequency of Social Gatherings with Friends and Family: Once a week   Attends Religious Services: Never   Marine scientist or Organizations: No   Attends Music therapist: Never   Marital Status: Never married  Human resources officer Violence: Not on file    Family History: Family History   Problem Relation Age of Onset   Lung cancer Maternal Uncle        dx late 29s   Prostate cancer Paternal Uncle        dx late 56s   Ovarian cancer Paternal Grandmother        dx 54s   Prostate cancer Paternal Grandfather        dx 5s   Prostate cancer Paternal Uncle        dx early 60s    Review of Systems: ROS Denies recent illness, infection, or rash.  Physical Exam: Vital Signs BP 137/87 (BP Location: Right Arm, Patient Position: Sitting, Cuff Size: Large)   Pulse 77   Ht 5\' 3"  (1.6 m)   Wt 185 lb 3.2 oz (84 kg)   SpO2 100%   BMI 32.81 kg/m  Physical Exam Constitutional:      General: Not in acute distress.    Appearance: Normal appearance. Not ill-appearing.  HENT:     Head: Normocephalic and atraumatic.  Eyes:     Pupils: Pupils are equal, round Neck:     Musculoskeletal: Normal range of motion.  Cardiovascular:     Rate and Rhythm: Normal rate    Pulses: Normal pulses.  Pulmonary:     Effort: Pulmonary effort is normal. No respiratory distress.  Abdominal:     General: Abdomen is flat. There is no distension.  Musculoskeletal: Normal range of motion.  No lower extremity swelling or edema appreciated on exam.  No varicosities noted.  Compression stockings.  Ambulatory. Skin:    General: Skin is warm and dry.     Findings: No erythema or rash.  Neurological:     General: No focal deficit present.     Mental Status: Alert and oriented to person, place, and time. Mental status is at baseline.     Motor: No weakness.  Psychiatric:        Mood and Affect: Mood normal.        Behavior: Behavior normal.    Assessment/Plan: The patient is scheduled for right-sided breast reduction with bilateral fat grafting 10/27/2021 with Dr. Claudia Desanctis.  Risks, benefits, and alternatives of procedure discussed, questions answered and consent obtained.    Smoking Status: Daily marijuana use, denies any nicotine. Last Mammogram: 04/2021; Results: BI-RADS Category 2,  benign.  Caprini Score: 5; Risk Factors include: History of malignancy, BMI greater than 25, and length of planned surgery. Recommendation for mechanical prophylaxis. Encourage early ambulation.   Pictures obtained: 07/30/2021  Post-op Rx sent to pharmacy: Norco, Zofran.  Patient was provided with the General Surgical Risk consent document and Pain Medication Agreement prior to their appointment.  They had adequate time to read through the risk consent documents and Pain Medication Agreement. We also discussed them in person together during this preop appointment. All of their questions were answered to their satisfaction.  Recommended calling if they have any further questions.  Risk consent form and Pain Medication Agreement to be scanned into patient's chart.  The risk that can be encountered with breast reduction were discussed and include the following but not limited to these:  Breast asymmetry, fluid accumulation, firmness of the breast, inability to breast feed, loss of nipple or areola, skin loss, decrease or no nipple sensation, fat necrosis of the breast tissue, bleeding, infection, healing delay.  There are risks of anesthesia, changes to skin sensation and injury to nerves or blood vessels.  The muscle can be temporarily or permanently injured.  You may have an allergic reaction to tape, suture, glue, blood products which can result in skin discoloration, swelling, pain, skin lesions, poor healing.  Any of these can lead to the need for revisonal surgery or stage procedures.  A reduction has potential to interfere with diagnostic procedures.  Nipple or breast piercing can increase risks of infection.  This procedure is best done when the breast is fully developed.  Changes in the breast will continue to occur over time.  Pregnancy can alter the outcomes of previous breast reduction surgery, weight gain and weigh loss can also effect the long term appearance.   The risks that can be  encountered with and after liposuction were discussed and include the following but no limited to these: Asymmetry, fluid accumulation, firmness of the area, fat necrosis with death of fat tissue, bleeding, infection,  delayed healing, anesthesia risks, skin sensation changes, injury to structures including nerves, blood vessels, and muscles which may be temporary or permanent, allergies to tape, suture materials and glues, blood products, topical preparations or injected agents, skin and contour irregularities, skin discoloration and swelling, deep vein thrombosis, cardiac and pulmonary complications, pain, which may persist, persistent pain, recurrence of the lesion, poor healing of the incision, possible need for revisional surgery or staged procedures. Thiere can also be persistent swelling, poor wound healing, rippling or loose skin, worsening of cellulite, swelling, and thermal burn or heat injury from ultrasound with the ultrasound-assisted lipoplasty technique. Any change in weight fluctuations can alter the outcome.   Electronically signed by: Krista Blue, PA-C 10/21/2021 9:38 AM

## 2021-10-20 ENCOUNTER — Other Ambulatory Visit: Payer: Self-pay

## 2021-10-20 ENCOUNTER — Ambulatory Visit: Payer: 59 | Attending: Surgery

## 2021-10-20 DIAGNOSIS — M25612 Stiffness of left shoulder, not elsewhere classified: Secondary | ICD-10-CM | POA: Diagnosis present

## 2021-10-20 DIAGNOSIS — Z170 Estrogen receptor positive status [ER+]: Secondary | ICD-10-CM

## 2021-10-20 DIAGNOSIS — C50412 Malignant neoplasm of upper-outer quadrant of left female breast: Secondary | ICD-10-CM | POA: Insufficient documentation

## 2021-10-20 DIAGNOSIS — Z483 Aftercare following surgery for neoplasm: Secondary | ICD-10-CM

## 2021-10-20 DIAGNOSIS — L599 Disorder of the skin and subcutaneous tissue related to radiation, unspecified: Secondary | ICD-10-CM

## 2021-10-20 DIAGNOSIS — R293 Abnormal posture: Secondary | ICD-10-CM | POA: Diagnosis present

## 2021-10-20 DIAGNOSIS — R600 Localized edema: Secondary | ICD-10-CM

## 2021-10-20 NOTE — Therapy (Addendum)
[facility] @ [place] [address] [place], [place], [address] Phone: [phone]   Fax:  [phone]  Physical Therapy Treatment  Patient Details  Name: Jeanne Evans MRN: [id number] Date of Birth: 1986/11/14 Referring Provider (PT): Dr. Erroll Luna   Encounter Date: 10/20/2021   PT End of Session - 10/20/21 1022     Visit Number 11    Number of Visits 22    Date for PT Re-Evaluation 11/24/21    PT Start Time 1008    Activity Tolerance Patient tolerated treatment well    Behavior During Therapy [facility] for tasks assessed/performed             Past Medical History:  Diagnosis Date   Asthma 07/31/2021   Breast cancer [facility])    Family history of ovarian cancer    Family history of prostate cancer    GERD (gastroesophageal reflux disease)    History of asthma    has albuterol, uses PRN   History of heart murmur in childhood    History of multiple allergies     Past Surgical History:  Procedure Laterality Date   BREAST LUMPECTOMY WITH RADIOACTIVE SEED AND SENTINEL LYMPH NODE BIOPSY Left 11/19/2020   Procedure: LEFT BREAST LUMPECTOMY X 2 WITH RADIOACTIVE SEED AND SENTINEL LYMPH NODE MAPPING;  Surgeon: Erroll Luna, MD;  Location: [facility];  Service: General;  Laterality: Left;  PECTORAL BLOCK   PORTACATH PLACEMENT Right 06/18/2020   Procedure: INSERTION PORT-A-CATH WITH ULTRASOUND GUIDANCE;  Surgeon: Erroll Luna, MD;  Location: [facility];  Service: General;  Laterality: Right;   RE-EXCISION OF BREAST LUMPECTOMY Left 12/09/2020   Procedure: RE-EXCISION OF LEFT BREAST LUMPECTOMY;  Surgeon: Erroll Luna, MD;  Location: [facility];  Service: General;  Laterality: Left;    There were no vitals filed for this visit.   Subjective Assessment - 10/20/21 1008     Subjective The nerves in my arm have calmed down a little. Hand has been doing well and not swelling.  The breast  pain comes and goes.  I did some MLD because it was feeling a little swollen.  The firm area in my incision seems softer.  After I did the MLD the pain felt better too.    Pertinent History Patient was diagnosed on 05/12/2020 with left breast cancer. She underwent neoadjuvant chemotherapy 06/19/2020-09/23/2020 and had a left lumpectomy and sentinel node biopsy (6 negative nodes) on 11/19/2020.  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 Numbness in posterior arm is a little better.    Patient Stated Goals Decrease pain and improve shoulder ROM, decrease swelling    Currently in Pain? No/denies    Pain Score 0-No pain    Pain Orientation Left    Pain Descriptors / Indicators Heaviness    Pain Onset More than a month ago    Multiple Pain Sites No                    L-DEX FLOWSHEETS - 10/20/21 1000       L-DEX LYMPHEDEMA SCREENING   Measurement Type Unilateral    L-DEX MEASUREMENT EXTREMITY Upper Extremity    POSITION  Standing    DOMINANT SIDE Right    At Risk Side Left    BASELINE SCORE (UNILATERAL) -1.2    L-DEX SCORE (UNILATERAL) 2.8    VALUE CHANGE (UNILAT) 4  [facility] Adult PT Treatment/Exercise - 10/20/21 0001       Manual Therapy   Edema Management performed SOZO screen    Manual Lymphatic Drainage (MLD) Mld to supraclavicular, right axillary and left axillary LN,Anterior interaxillary pathway, left axillo-inguinal pathway and left breast , retracing all steps and ending with LN/s                          PT Long Term Goals - 10/13/21 1136       PT LONG TERM GOAL #1   Title Patient will demonstrate independence in HEP   and that she has regained full function post operatively compared to baselines.    Baseline still not doing full work hours or duties    Time 6    Status On-going      PT LONG TERM GOAL #2   Title Patient will increase left shoulder active flexion to >/= 165 degrees for increased ease  reaching.    Time 6    Period Weeks    Status Achieved    Target Date 10/13/21      PT LONG TERM GOAL #3   Title Patient will increase left shoulder active abduction to >/= 165 degrees for increased ease reaching.    Time 6    Period Weeks    Status On-going      PT LONG TERM GOAL #4   Title Pt will be independent in self management of left UE lymphedema    Time 6    Period Weeks    Status Achieved      PT LONG TERM GOAL #5   Title Patient will report >/= 50% decrease in pain/tightness for increased tolerance of daily tasks.    Baseline still has tightness and pain under arm and lateral breast    Time 6    Period Weeks    Status On-going      PT LONG TERM GOAL #6   Title Pt will increase sit to stand in 30 seconds to 12 to indicate an improvement in functional strength    Baseline 8, 9 on 10/13/2021    Time 6    Period Weeks    Status On-going                   Plan - 10/20/21 1022     Clinical Impression Statement Performed Sozo screen today and her number did come down slightly from previous SOZO that was up but is still higher than her baseline.  She has been compliant with wearing her sleeve. Performed Manual lymph drainage today to left breast.  Area of fibrosis is much smaller and is much softer.  She will have another breast surgery next week.    Personal Factors and Comorbidities Comorbidity 3+    Comorbidities Left breast CA functionally triple neg, chemo, radiation    Stability/Clinical Decision Making Stable/Uncomplicated    Rehab Potential Good    PT Frequency 2x / week    PT Duration 6 weeks    PT Treatment/Interventions ADLs/Self Care Home Management;Therapeutic exercise;Patient/family education;Passive range of motion;Manual lymph drainage;Manual techniques;Scar mobilization    PT Next Visit Plan STM to pecs and upper quarter, MFR to axillary cord, PROM, continue to monitor for swelling (SOZO today 4.6 over baseline ), MLD prn, pwer plate stretch  for pecs, strength    PT Home Exercise Plan sleeve and gauntlet daily unless fingers swell, MLD daily, resume shoulder wand and wall  slides for shoulder ROM    Consulted and Agree with Plan of Care Patient             Patient will benefit from skilled therapeutic intervention in order to improve the following deficits and impairments:  Postural dysfunction, Decreased range of motion, Impaired UE functional use, Pain, Decreased knowledge of precautions, Decreased scar mobility, Increased fascial restricitons, Increased edema, Impaired flexibility  Visit Diagnosis: Stiffness of left shoulder, not elsewhere classified  Abnormal posture  Localized edema  Aftercare following surgery for neoplasm  Malignant neoplasm of upper-outer quadrant of left breast in female, estrogen receptor positive ([place])  Disorder of the skin and subcutaneous tissue related to radiation, unspecified     Problem List Patient Active Problem List   Diagnosis Date Noted   Asthma 07/31/2021   Lymphedema of left arm 02/02/2021   Drug-induced neutropenia ([facility]) 09/30/2020   Genetic testing 06/26/2020   Family history of ovarian cancer    Family history of prostate cancer    Malignant neoplasm of upper-outer quadrant of left breast in female, estrogen receptor negative ([place] [facility]) 06/03/2020  PHYSICAL THERAPY DISCHARGE SUMMARY  Visits from Start of Care: 11  Current functional level related to goals / functional outcomes: Present function is unknown. She did not return. She had partially achieved established goals   Remaining deficits: Unknown. Not seen recently   Education / Equipment: Compression sleeve, gauntlet, bandages   Patient agrees to discharge. Patient goals were partially met. Patient is being discharged due to not returning since the last visit.   Claris Pong, PT 10/20/2021, 10:56 AM  [facility] @ [address] [place], [place],  [address] Phone: [phone]   Fax:  [phone]  Name: ANSHU WEHNER MRN: [id number] Date of Birth: June 21, 1986

## 2021-10-21 ENCOUNTER — Encounter: Payer: Self-pay | Admitting: Physician Assistant

## 2021-10-21 ENCOUNTER — Encounter (HOSPITAL_BASED_OUTPATIENT_CLINIC_OR_DEPARTMENT_OTHER): Payer: Self-pay | Admitting: Plastic Surgery

## 2021-10-21 ENCOUNTER — Ambulatory Visit (INDEPENDENT_AMBULATORY_CARE_PROVIDER_SITE_OTHER): Payer: 59 | Admitting: Physician Assistant

## 2021-10-21 VITALS — BP 137/87 | HR 77 | Ht 63.0 in | Wt 185.2 lb

## 2021-10-21 DIAGNOSIS — C50412 Malignant neoplasm of upper-outer quadrant of left female breast: Secondary | ICD-10-CM

## 2021-10-21 DIAGNOSIS — Z171 Estrogen receptor negative status [ER-]: Secondary | ICD-10-CM

## 2021-10-21 MED ORDER — HYDROCODONE-ACETAMINOPHEN 5-325 MG PO TABS: 1.0000 | ORAL_TABLET | Freq: Four times a day (QID) | ORAL | 0 refills | Status: AC | PRN

## 2021-10-21 MED ORDER — ONDANSETRON 4 MG PO TBDP: 4.0000 mg | ORAL_TABLET | Freq: Three times a day (TID) | ORAL | 0 refills | Status: DC | PRN

## 2021-10-22 ENCOUNTER — Ambulatory Visit: Payer: 59

## 2021-10-26 NOTE — Progress Notes (Signed)
Sent text reminding pt to come pick up CHG soap.

## 2021-10-26 NOTE — Anesthesia Preprocedure Evaluation (Addendum)
Anesthesia Evaluation  Patient identified by MRN, date of birth, ID band Patient awake    Reviewed: Allergy & Precautions, NPO status , Patient's Chart, lab work & pertinent test results  History of Anesthesia Complications Negative for: history of anesthetic complications  Airway Mallampati: II  TM Distance: >3 FB Neck ROM: Full    Dental no notable dental hx.    Pulmonary asthma ,    Pulmonary exam normal        Cardiovascular negative cardio ROS Normal cardiovascular exam     Neuro/Psych negative neurological ROS  negative psych ROS   GI/Hepatic GERD  Medicated and Controlled,(+)     substance abuse  marijuana use,   Endo/Other  negative endocrine ROS  Renal/GU negative Renal ROS  negative genitourinary   Musculoskeletal negative musculoskeletal ROS (+)   Abdominal   Peds  Hematology negative hematology ROS (+)   Anesthesia Other Findings Breast ca  Reproductive/Obstetrics negative OB ROS                           Anesthesia Physical Anesthesia Plan  ASA: 2  Anesthesia Plan: General   Post-op Pain Management: Tylenol PO (pre-op)   Induction: Intravenous  PONV Risk Score and Plan: 3 and Treatment may vary due to age or medical condition, Midazolam, Scopolamine patch - Pre-op, Ondansetron and Dexamethasone  Airway Management Planned: Oral ETT  Additional Equipment: None  Intra-op Plan:   Post-operative Plan: Extubation in OR  Informed Consent: I have reviewed the patients History and Physical, chart, labs and discussed the procedure including the risks, benefits and alternatives for the proposed anesthesia with the patient or authorized representative who has indicated his/her understanding and acceptance.     Dental advisory given  Plan Discussed with: CRNA  Anesthesia Plan Comments:       Anesthesia Quick Evaluation

## 2021-10-26 NOTE — Progress Notes (Signed)
Surgical soap given with instructions, pt verbalized understanding.  

## 2021-10-27 ENCOUNTER — Encounter (HOSPITAL_BASED_OUTPATIENT_CLINIC_OR_DEPARTMENT_OTHER): Payer: Self-pay | Admitting: Plastic Surgery

## 2021-10-27 ENCOUNTER — Ambulatory Visit (HOSPITAL_BASED_OUTPATIENT_CLINIC_OR_DEPARTMENT_OTHER)
Admission: RE | Admit: 2021-10-27 | Discharge: 2021-10-27 | Disposition: A | Discharge status: Home / Self Care | Payer: 59 | Attending: Plastic Surgery | Admitting: Plastic Surgery

## 2021-10-27 ENCOUNTER — Ambulatory Visit (HOSPITAL_BASED_OUTPATIENT_CLINIC_OR_DEPARTMENT_OTHER): Payer: 59 | Admitting: Anesthesiology

## 2021-10-27 ENCOUNTER — Other Ambulatory Visit: Payer: Self-pay

## 2021-10-27 ENCOUNTER — Encounter (HOSPITAL_BASED_OUTPATIENT_CLINIC_OR_DEPARTMENT_OTHER)
Admission: RE | Disposition: A | Discharge status: Home / Self Care | Payer: Self-pay | Source: Home / Self Care | Attending: Plastic Surgery

## 2021-10-27 DIAGNOSIS — Z882 Allergy status to sulfonamides status: Secondary | ICD-10-CM | POA: Diagnosis not present

## 2021-10-27 DIAGNOSIS — J45909 Unspecified asthma, uncomplicated: Secondary | ICD-10-CM | POA: Insufficient documentation

## 2021-10-27 DIAGNOSIS — N6489 Other specified disorders of breast: Secondary | ICD-10-CM | POA: Insufficient documentation

## 2021-10-27 DIAGNOSIS — C50412 Malignant neoplasm of upper-outer quadrant of left female breast: Secondary | ICD-10-CM | POA: Insufficient documentation

## 2021-10-27 DIAGNOSIS — Z171 Estrogen receptor negative status [ER-]: Secondary | ICD-10-CM

## 2021-10-27 DIAGNOSIS — N651 Disproportion of reconstructed breast: Secondary | ICD-10-CM

## 2021-10-27 DIAGNOSIS — K219 Gastro-esophageal reflux disease without esophagitis: Secondary | ICD-10-CM | POA: Insufficient documentation

## 2021-10-27 HISTORY — PX: BREAST REDUCTION SURGERY: SHX8

## 2021-10-27 HISTORY — PX: LIPOSUCTION WITH LIPOFILLING: SHX6436

## 2021-10-27 LAB — POCT PREGNANCY, URINE: Preg Test, Ur: NEGATIVE

## 2021-10-27 SURGERY — MAMMOPLASTY, REDUCTION
Anesthesia: General | Site: Breast | Laterality: Right

## 2021-10-27 MED ORDER — FENTANYL CITRATE (PF) 100 MCG/2ML IJ SOLN
INTRAMUSCULAR | Status: DC | PRN
  Administered 2021-10-27 (×2): 100 ug via INTRAVENOUS
  Administered 2021-10-27 (×2): 50 ug via INTRAVENOUS

## 2021-10-27 MED ORDER — FENTANYL CITRATE (PF) 100 MCG/2ML IJ SOLN
INTRAMUSCULAR | Status: AC
  Filled 2021-10-27: qty 2

## 2021-10-27 MED ORDER — MIDAZOLAM HCL 2 MG/2ML IJ SOLN
INTRAMUSCULAR | Status: AC
  Filled 2021-10-27: qty 2

## 2021-10-27 MED ORDER — CEFAZOLIN SODIUM-DEXTROSE 2-4 GM/100ML-% IV SOLN
INTRAVENOUS | Status: AC
  Filled 2021-10-27: qty 100

## 2021-10-27 MED ORDER — ACETAMINOPHEN 500 MG PO TABS
ORAL_TABLET | ORAL | Status: AC
  Filled 2021-10-27: qty 2

## 2021-10-27 MED ORDER — DEXAMETHASONE SODIUM PHOSPHATE 10 MG/ML IJ SOLN
INTRAMUSCULAR | Status: AC
  Filled 2021-10-27: qty 1

## 2021-10-27 MED ORDER — EPINEPHRINE PF 1 MG/ML IJ SOLN
INTRAMUSCULAR | Status: AC
  Filled 2021-10-27: qty 1

## 2021-10-27 MED ORDER — BUPIVACAINE HCL (PF) 0.25 % IJ SOLN
INTRAMUSCULAR | Status: AC
  Filled 2021-10-27: qty 30

## 2021-10-27 MED ORDER — SCOPOLAMINE 1 MG/3DAYS TD PT72
1.0000 | MEDICATED_PATCH | Freq: Once | TRANSDERMAL | Status: DC
  Administered 2021-10-27: 1.5 mg via TRANSDERMAL

## 2021-10-27 MED ORDER — KETOROLAC TROMETHAMINE 30 MG/ML IJ SOLN
INTRAMUSCULAR | Status: AC
  Filled 2021-10-27: qty 1

## 2021-10-27 MED ORDER — SUGAMMADEX SODIUM 200 MG/2ML IV SOLN
INTRAVENOUS | Status: DC | PRN
  Administered 2021-10-27: 200 mg via INTRAVENOUS

## 2021-10-27 MED ORDER — LIDOCAINE HCL (CARDIAC) PF 100 MG/5ML IV SOSY
PREFILLED_SYRINGE | INTRAVENOUS | Status: DC | PRN
Start: 2021-10-27 — End: 2021-10-27
  Administered 2021-10-27: 100 mg via INTRAVENOUS

## 2021-10-27 MED ORDER — DEXAMETHASONE SODIUM PHOSPHATE 4 MG/ML IJ SOLN
INTRAMUSCULAR | Status: DC | PRN
  Administered 2021-10-27: 10 mg via INTRAVENOUS

## 2021-10-27 MED ORDER — ACETAMINOPHEN 500 MG PO TABS
1000.0000 mg | ORAL_TABLET | Freq: Once | ORAL | Status: AC
  Administered 2021-10-27: 1000 mg via ORAL

## 2021-10-27 MED ORDER — PROPOFOL 500 MG/50ML IV EMUL
INTRAVENOUS | Status: AC
  Filled 2021-10-27: qty 50

## 2021-10-27 MED ORDER — CHLORHEXIDINE GLUCONATE CLOTH 2 % EX PADS: 6.0000 | MEDICATED_PAD | Freq: Once | CUTANEOUS | Status: DC

## 2021-10-27 MED ORDER — PROPOFOL 10 MG/ML IV BOLUS
INTRAVENOUS | Status: DC | PRN
  Administered 2021-10-27: 200 mg via INTRAVENOUS

## 2021-10-27 MED ORDER — LACTATED RINGERS IV SOLN
INTRAVENOUS | Status: DC | PRN
  Administered 2021-10-27: 1550 mL

## 2021-10-27 MED ORDER — KETOROLAC TROMETHAMINE 30 MG/ML IJ SOLN
30.0000 mg | Freq: Once | INTRAMUSCULAR | Status: AC
  Administered 2021-10-27: 30 mg via INTRAVENOUS

## 2021-10-27 MED ORDER — ROCURONIUM BROMIDE 100 MG/10ML IV SOLN
INTRAVENOUS | Status: DC | PRN
Start: 2021-10-27 — End: 2021-10-27
  Administered 2021-10-27: 60 mg via INTRAVENOUS

## 2021-10-27 MED ORDER — MIDAZOLAM HCL 5 MG/5ML IJ SOLN
INTRAMUSCULAR | Status: DC | PRN
  Administered 2021-10-27: 2 mg via INTRAVENOUS

## 2021-10-27 MED ORDER — CEFAZOLIN SODIUM-DEXTROSE 2-4 GM/100ML-% IV SOLN
2.0000 g | INTRAVENOUS | Status: AC
  Administered 2021-10-27: 2 g via INTRAVENOUS

## 2021-10-27 MED ORDER — OXYCODONE HCL 5 MG/5ML PO SOLN: 5.0000 mg | Freq: Once | ORAL | Status: AC | PRN

## 2021-10-27 MED ORDER — OXYCODONE HCL 5 MG PO TABS
ORAL_TABLET | ORAL | Status: AC
  Filled 2021-10-27: qty 1

## 2021-10-27 MED ORDER — BUPIVACAINE-EPINEPHRINE (PF) 0.25% -1:200000 IJ SOLN
INTRAMUSCULAR | Status: AC
  Filled 2021-10-27: qty 30

## 2021-10-27 MED ORDER — ONDANSETRON HCL 4 MG/2ML IJ SOLN
INTRAMUSCULAR | Status: AC
  Filled 2021-10-27: qty 2

## 2021-10-27 MED ORDER — FENTANYL CITRATE (PF) 100 MCG/2ML IJ SOLN
25.0000 ug | INTRAMUSCULAR | Status: DC | PRN
  Administered 2021-10-27 (×3): 50 ug via INTRAVENOUS

## 2021-10-27 MED ORDER — SCOPOLAMINE 1 MG/3DAYS TD PT72
MEDICATED_PATCH | TRANSDERMAL | Status: AC
  Filled 2021-10-27: qty 1

## 2021-10-27 MED ORDER — ONDANSETRON HCL 4 MG/2ML IJ SOLN
INTRAMUSCULAR | Status: DC | PRN
  Administered 2021-10-27: 4 mg via INTRAVENOUS

## 2021-10-27 MED ORDER — LACTATED RINGERS IV SOLN: INTRAVENOUS | Status: DC

## 2021-10-27 MED ORDER — PROMETHAZINE HCL 25 MG/ML IJ SOLN: 6.2500 mg | INTRAMUSCULAR | Status: DC | PRN

## 2021-10-27 MED ORDER — PHENYLEPHRINE 40 MCG/ML (10ML) SYRINGE FOR IV PUSH (FOR BLOOD PRESSURE SUPPORT)
PREFILLED_SYRINGE | INTRAVENOUS | Status: AC
  Filled 2021-10-27: qty 10

## 2021-10-27 MED ORDER — OXYCODONE HCL 5 MG PO TABS
5.0000 mg | ORAL_TABLET | Freq: Once | ORAL | Status: AC | PRN
  Administered 2021-10-27: 5 mg via ORAL

## 2021-10-27 SURGICAL SUPPLY — 61 items
APL PRP STRL LF DISP 70% ISPRP (MISCELLANEOUS) ×4
APL SKNCLS STERI-STRIP NONHPOA (GAUZE/BANDAGES/DRESSINGS) ×4
BENZOIN TINCTURE PRP APPL 2/3 (GAUZE/BANDAGES/DRESSINGS) ×6 IMPLANT
BINDER ABDOMINAL 10 UNV 27-48 (MISCELLANEOUS) ×1 IMPLANT
BLADE SURG 10 STRL SS (BLADE) ×6 IMPLANT
BLADE SURG 11 STRL SS (BLADE) ×3 IMPLANT
BNDG CMPR MED 10X6 ELC LF (GAUZE/BANDAGES/DRESSINGS) ×2
BNDG ELASTIC 6X10 VLCR STRL LF (GAUZE/BANDAGES/DRESSINGS) ×3 IMPLANT
CANISTER SUCT 1200ML W/VALVE (MISCELLANEOUS) ×3 IMPLANT
CHLORAPREP W/TINT 26 (MISCELLANEOUS) ×6 IMPLANT
COVER BACK TABLE 60X90IN (DRAPES) ×3 IMPLANT
COVER MAYO STAND STRL (DRAPES) ×5 IMPLANT
DRAPE LAPAROSCOPIC ABDOMINAL (DRAPES) ×3 IMPLANT
DRAPE TOP ARMCOVERS (MISCELLANEOUS) ×3 IMPLANT
DRAPE U-SHAPE 76X120 STRL (DRAPES) ×3 IMPLANT
DRAPE UTILITY XL STRL (DRAPES) ×3 IMPLANT
DRSG PAD ABDOMINAL 8X10 ST (GAUZE/BANDAGES/DRESSINGS) ×12 IMPLANT
ELECT REM PT RETURN 9FT ADLT (ELECTROSURGICAL) ×3
ELECTRODE REM PT RTRN 9FT ADLT (ELECTROSURGICAL) ×2 IMPLANT
EXTRACTOR CANIST REVOLVE STRL (CANNISTER) ×3 IMPLANT
GAUZE SPONGE 4X4 12PLY STRL (GAUZE/BANDAGES/DRESSINGS) ×6 IMPLANT
GLOVE SRG 8 PF TXTR STRL LF DI (GLOVE) ×2 IMPLANT
GLOVE SURG ENC MOIS LTX SZ7.5 (GLOVE) ×3 IMPLANT
GLOVE SURG ENC TEXT LTX SZ7.5 (GLOVE) ×3 IMPLANT
GLOVE SURG UNDER POLY LF SZ8 (GLOVE) ×3
GOWN STRL REUS W/ TWL LRG LVL3 (GOWN DISPOSABLE) ×4 IMPLANT
GOWN STRL REUS W/ TWL XL LVL3 (GOWN DISPOSABLE) ×4 IMPLANT
GOWN STRL REUS W/TWL LRG LVL3 (GOWN DISPOSABLE) ×6
GOWN STRL REUS W/TWL XL LVL3 (GOWN DISPOSABLE) ×6
LINER CANISTER 1000CC FLEX (MISCELLANEOUS) ×3 IMPLANT
NDL FILTER BLUNT 18X1 1/2 (NEEDLE) ×2 IMPLANT
NDL SAFETY ECLIPSE 18X1.5 (NEEDLE) ×2 IMPLANT
NDL SPNL 18GX3.5 QUINCKE PK (NEEDLE) ×2 IMPLANT
NEEDLE FILTER BLUNT 18X 1/2SAF (NEEDLE) ×1
NEEDLE FILTER BLUNT 18X1 1/2 (NEEDLE) ×2 IMPLANT
NEEDLE HYPO 18GX1.5 SHARP (NEEDLE) ×3
NEEDLE SPNL 18GX3.5 QUINCKE PK (NEEDLE) ×3 IMPLANT
NS IRRIG 1000ML POUR BTL (IV SOLUTION) ×3 IMPLANT
PACK BASIN DAY SURGERY FS (CUSTOM PROCEDURE TRAY) ×3 IMPLANT
PAD ALCOHOL SWAB (MISCELLANEOUS) ×3 IMPLANT
PENCIL SMOKE EVACUATOR (MISCELLANEOUS) ×3 IMPLANT
SHEET MEDIUM DRAPE 40X70 STRL (DRAPES) ×6 IMPLANT
SLEEVE SCD COMPRESS KNEE MED (STOCKING) ×3 IMPLANT
SPONGE T-LAP 18X18 ~~LOC~~+RFID (SPONGE) ×9 IMPLANT
STAPLER INSORB 30 2030 C-SECTI (MISCELLANEOUS) ×3 IMPLANT
STAPLER VISISTAT 35W (STAPLE) ×3 IMPLANT
STRIP SUTURE WOUND CLOSURE 1/2 (MISCELLANEOUS) ×9 IMPLANT
SUT CHROMIC 5 0 RB 1 27 (SUTURE) ×3 IMPLANT
SUT MNCRL AB 4-0 PS2 18 (SUTURE) ×12 IMPLANT
SUT PDS 3-0 CT2 (SUTURE) ×9
SUT PDS II 3-0 CT2 27 ABS (SUTURE) ×6 IMPLANT
SUT VLOC 180 P-14 24 (SUTURE) ×6 IMPLANT
SYR 10ML LL (SYRINGE) ×11 IMPLANT
SYR 50ML LL SCALE MARK (SYRINGE) ×1 IMPLANT
SYR BULB IRRIG 60ML STRL (SYRINGE) ×3 IMPLANT
TOWEL GREEN STERILE FF (TOWEL DISPOSABLE) ×6 IMPLANT
TUBE CONNECTING 20X1/4 (TUBING) ×3 IMPLANT
TUBING INFILTRATION IT-10001 (TUBING) ×3 IMPLANT
TUBING SET GRADUATE ASPIR 12FT (MISCELLANEOUS) ×3 IMPLANT
UNDERPAD 30X36 HEAVY ABSORB (UNDERPADS AND DIAPERS) ×6 IMPLANT
YANKAUER SUCT BULB TIP NO VENT (SUCTIONS) ×3 IMPLANT

## 2021-10-27 NOTE — Anesthesia Procedure Notes (Signed)
Procedure Name: Intubation Date/Time: 10/27/2021 8:06 AM Performed by: Tawni Millers, CRNA Pre-anesthesia Checklist: Patient identified, Emergency Drugs available, Suction available and Patient being monitored Patient Re-evaluated:Patient Re-evaluated prior to induction Oxygen Delivery Method: Circle system utilized Preoxygenation: Pre-oxygenation with 100% oxygen Induction Type: IV induction Ventilation: Mask ventilation without difficulty Laryngoscope Size: Mac and 3 Grade View: Grade I Tube type: Oral Tube size: 7.0 mm Number of attempts: 1 Airway Equipment and Method: Stylet and Oral airway Placement Confirmation: ETT inserted through vocal cords under direct vision, positive ETCO2 and breath sounds checked- equal and bilateral Tube secured with: Tape Dental Injury: Teeth and Oropharynx as per pre-operative assessment

## 2021-10-27 NOTE — Anesthesia Postprocedure Evaluation (Signed)
Anesthesia Post Note  Patient: Jeanne Evans  Procedure(s) Performed: MAMMARY REDUCTION  TO RIGHT BREAST (Right: Breast) BILATERAL FAT GRAFTING TO BREAST (Bilateral: Breast)     Patient location during evaluation: PACU Anesthesia Type: General Level of consciousness: awake and alert and oriented Pain management: pain level controlled Vital Signs Assessment: post-procedure vital signs reviewed and stable Respiratory status: spontaneous breathing, nonlabored ventilation and respiratory function stable Cardiovascular status: blood pressure returned to baseline Postop Assessment: no apparent nausea or vomiting Anesthetic complications: no   No notable events documented.  Last Vitals:  Vitals:   10/27/21 1130 10/27/21 1145  BP: (!) 136/98 (!) 152/98  Pulse: 76 64  Resp: 13 16  Temp:    SpO2: 93% 95%    Last Pain:  Vitals:   10/27/21 1145  TempSrc:   PainSc: [place]

## 2021-10-27 NOTE — Op Note (Signed)
Operative Note   DATE OF OPERATION: 10/27/2021  SURGICAL DEPARTMENT: Plastic Surgery  PREOPERATIVE DIAGNOSES: Breast asymmetry after treatment of left breast cancer  POSTOPERATIVE DIAGNOSES:  same  PROCEDURE: 1.  Right breast reduction 2.  Left breast mastopexy 3.  Fat grafting to bilateral breast 90 cc to the right and 50 cc to the left  SURGEON: Talmadge Coventry, MD  ASSISTANT: Verdie Shire, PA The advanced practice practitioner (APP) assisted throughout the case.  The APP was essential in retraction and counter traction when needed to make the case progress smoothly.  This retraction and assistance made it possible to see the tissue plans for the procedure.  The assistance was needed for blood control, tissue re-approximation and assisted with closure of the incision site.  ANESTHESIA:  General.   COMPLICATIONS: None.   INDICATIONS FOR PROCEDURE:  The patient, Jeanne Evans is a 35 y.o. female born on 1986/09/16, is here for treatment of breast asymmetry after treatment of left breast cancer MRN: [id number]  CONSENT:  Informed consent was obtained directly from the patient. Risks, benefits and alternatives were fully discussed. Specific risks including but not limited to bleeding, infection, hematoma, seroma, scarring, pain, contracture, asymmetry, wound healing problems, and need for further surgery were all discussed. The patient did have an ample opportunity to have questions answered to satisfaction.   DESCRIPTION OF PROCEDURE:  The patient was taken to the operating room. SCDs were placed and antibiotics were given.  General anesthesia was administered.  The patient's operative site was prepped and draped in a sterile fashion. A time out was performed and all information was confirmed to be correct.  I marked her preoperatively in the standing position for a right breast reduction.  These marks were reinforced in the operating room and the nipple was marked with a 45 mm  cookie cutter.  I then infiltrated a small amount of tumescent solution.  This was given time to work.  The upper portion of the excision was then de-epithelialized with a 10 blade.  The lower wedge was excised with a combination of knife and cautery.  The incisions were stapled closed.  After assessing her symmetry it appeared that she would require a mastopexy on the left side for appropriate symmetry.  I marked the left nipple with a 45 mm cookie cutter.  A conservative mastopexy was then drawn out and a Wise pattern excision.  A small amount of tumescent solution was infiltrated.  The upper portion was de-epithelialized and the lower wedge was excised with a combination of knife and cautery.  Hemostasis was ensured and the incisions were stapled closed.  This gave much better symmetry.  We then moved onto the fat grafting portion of the case.  Tumescent solution was infiltrated in the anterior abdominal wall which consisted of about a liter and a half of solution.  This was given time to work.  Power assisted liposuction was then used to harvest the fat using the revolve system according to manufacturer's instructions.  Once the fat had been processed 90 cc was infiltrated throughout the right side and 50 cc to the left side focusing on the lateral lumpectomy defect.  This gave her great shape symmetry and contour.  Both sides were then closed with interrupted buried 4-0 Monocryl for the periareolar limb, 3-0 PDS for the vertical limb, and buried in sorb staples for the inframammary limb.  A 3 oh V-Loc was then run throughout.  Port holes for liposuction were closed with 5-0 chromic.  Benzoin and Steri-Strips were applied followed by soft compressive dressing.    The patient tolerated the procedure well.  There were no complications. The patient was allowed to wake from anesthesia, extubated and taken to the recovery room in satisfactory condition.

## 2021-10-27 NOTE — Discharge Instructions (Addendum)
Activity As tolerated: NO showers for 3 days. Keep ACE wrap on breasts until then. After showering, put ACE wrap back on, this is important for compression. NO driving while in pain, taking pain medication or if you are unable to safely react to traffic. Wear abdominal binder 24/7 for first week. This will help with pain and swelling. Bruising can be expected. Drainage from the abdominal incisions is normal, the drainage will appear red/pink.  No heavy activities Take Pain medication (Norco) as needed for severe pain. Otherwise, you can use ibuprofen or tylenol as needed. Avoid more than 3,000 mg of tylenol in 24 hours. Norco has 325mg  of tylenol per dose.  Diet: Regular. Drink plenty of fluids and eat healthy, high protein, low carbs.  Wound Care: Keep dressing clean & dry. You may change bandages after showering if you continue to notice some drainage. You can reuse bandages if they are not dirty/soiled.  Special Instructions: Call Doctor if any unusual problems occur such as pain, excessive Bleeding, unrelieved Nausea/vomiting, Fever &/or chills  Follow-up appointment: Scheduled for next week.   Post Anesthesia Home Care Instructions  Activity: Get plenty of rest for the remainder of the day. A responsible individual must stay with you for 24 hours following the procedure.  For the next 24 hours, DO NOT: -Drive a car -Paediatric nurse -Drink alcoholic beverages -Take any medication unless instructed by your physician -Make any legal decisions or sign important papers.  Meals: Start with liquid foods such as gelatin or soup. Progress to regular foods as tolerated. Avoid greasy, spicy, heavy foods. If nausea and/or vomiting occur, drink only clear liquids until the nausea and/or vomiting subsides. Call your physician if vomiting continues.  Special Instructions/Symptoms: Your throat may feel dry or sore from the anesthesia or the breathing tube placed in your throat during surgery.  If this causes discomfort, gargle with warm salt water. The discomfort should disappear within 24 hours.  If you had a scopolamine patch placed behind your ear for the management of post- operative nausea and/or vomiting:  1. The medication in the patch is effective for 72 hours, after which it should be removed.  Wrap patch in a tissue and discard in the trash. Wash hands thoroughly with soap and water. 2. You may remove the patch earlier than 72 hours if you experience unpleasant side effects which may include dry mouth, dizziness or visual disturbances. 3. Avoid touching the patch. Wash your hands with soap and water after contact with the patch.  *May have Tylenol at 1pm today 10/27/21.

## 2021-10-27 NOTE — Transfer of Care (Signed)
Immediate Anesthesia Transfer of Care Note  Patient: Jeanne Evans  Procedure(s) Performed: MAMMARY REDUCTION  TO RIGHT BREAST (Right: Breast) BILATERAL FAT GRAFTING TO BREAST (Bilateral: Breast)  Patient Location: PACU  Anesthesia Type:General  Level of Consciousness: awake  Airway & Oxygen Therapy: Patient Spontanous Breathing and Patient connected to face mask oxygen  Post-op Assessment: Report given to RN and Post -op Vital signs reviewed and stable  Post vital signs: Reviewed and stable  Last Vitals:  Vitals Value Taken Time  BP    Temp    Pulse    Resp 16 10/27/21 1011  SpO2    Vitals shown include unvalidated device data.  Last Pain:  Vitals:   10/27/21 0647  TempSrc: Oral  PainSc: 0-No pain         Complications: No notable events documented.

## 2021-10-27 NOTE — Interval H&P Note (Signed)
Patient seen and examined. Risk and benefits discussed. Proceed with surgery.

## 2021-10-28 LAB — SURGICAL PATHOLOGY

## 2021-10-29 ENCOUNTER — Other Ambulatory Visit: Payer: 59

## 2021-10-29 ENCOUNTER — Ambulatory Visit: Payer: 59

## 2021-10-29 ENCOUNTER — Ambulatory Visit: Payer: 59 | Admitting: Oncology

## 2021-10-29 ENCOUNTER — Encounter (HOSPITAL_BASED_OUTPATIENT_CLINIC_OR_DEPARTMENT_OTHER): Payer: Self-pay | Admitting: Plastic Surgery

## 2021-10-31 ENCOUNTER — Other Ambulatory Visit: Payer: Self-pay

## 2021-11-04 ENCOUNTER — Ambulatory Visit (INDEPENDENT_AMBULATORY_CARE_PROVIDER_SITE_OTHER): Payer: 59 | Admitting: Plastic Surgery

## 2021-11-04 ENCOUNTER — Other Ambulatory Visit: Payer: Self-pay

## 2021-11-04 DIAGNOSIS — Z171 Estrogen receptor negative status [ER-]: Secondary | ICD-10-CM

## 2021-11-04 DIAGNOSIS — C50412 Malignant neoplasm of upper-outer quadrant of left female breast: Secondary | ICD-10-CM

## 2021-11-04 DIAGNOSIS — Z719 Counseling, unspecified: Secondary | ICD-10-CM

## 2021-11-04 NOTE — Progress Notes (Signed)
[place]  [phone] [phone]    ID: Jeanne Evans DOB: [birth year]  MR#: [id number]  XVE#:[id number]  Patient Care Team: [name], MD as PCP - General (Obstetrics and Gynecology) Rockwell Germany, RN as Oncology Nurse Navigator Mauro Kaufmann, RN as Oncology Nurse Navigator Erroll Luna, MD as Consulting Physician (General Surgery) Caelynn Marshman, Virgie Dad, MD as Consulting Physician (Oncology) Kyung Rudd, MD as Consulting Physician (Radiation Oncology) Servando Salina, MD as Consulting Physician (Obstetrics and Gynecology) Brand Males, MD as Consulting Physician (Pulmonary Disease) Claudia Desanctis Steffanie Dunn, MD as Consulting Physician (Plastic Surgery) Chauncey Cruel, MD OTHER MD:  CHIEF COMPLAINT: 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   She underwent right breast reduction and left mastopexy with some fat grafting bilaterally 10/27/2021, with normal breast tissue on the pathology from both samples ([id number]).  She is delighted with the results.  She continues on pembrolizumab, which was started 04/16/2021 and held 05/11/2021 until 08/06/2021, for pulmonary evaluation, which confirmed history of asthma and possible sarcoidosis. She receives pembrolizumab every 3 weeks, with a dose due today.  We are monitoring her TSH:  Latest Reference Range & Units 07/07/21 11:55 08/27/21 10:09 09/17/21 13:20 10/14/21 13:55  TSH 0.308 - 3.960 uIU/mL 1.040 0.638 0.403 0.707   REVIEW OF SYSTEMS: Jeanne Evans has some pain in the abdomen where she had some fat harvested and of course some soreness in the reconstructed chest.  She had some bad dreams with narcotics so she is using Aleve plus Tylenol and that is working well for her.  She is very pleased with the results and says she "cannot  believe they look this good".  She is limited as to what she can do in terms of upper body exercises but of course she can walk and she is taking short walks at present when the weather permits.  She is tolerating the pembrolizumab with no obvious complications and currently the asthma is not active.  A detailed review of systems was otherwise stable.  COVID 19 VACCINATION STATUS: [place] x2, most recently 01/2021   HISTORY OF CURRENT ILLNESS: From the original intake note:  Jeanne Evans herself palpated a painful left breast lump. She underwent bilateral diagnostic mammography with tomography and left breast ultrasonography at [facility] on 05/12/2020 showing: breast density category B; palpable 2.7 cm oval hypoechoic mass in left breast at 2-3 o'clock; 2.2 cm left axillary lymph node/grouping of nodes.  Accordingly on 05/29/2020 she proceeded to biopsy of the left breast area in question. The pathology from this procedure ([id number]) showed: invasive ductal carcinoma, grade 3. Prognostic indicators significant for: estrogen receptor, 10% positive with weak staining intensity and progesterone receptor, 0% negative. Proliferation marker Ki67 at 95%. HER2 equivocal by immunohistochemistry (2+), but negative by fluorescent in situ hybridization with a signals ratio 1.77 and number per cell 2.65.  Biopsy of the left axillary lymph node in question was negative and concordant  The patient's subsequent history is as detailed below.   PAST MEDICAL HISTORY: Past Medical History:  Diagnosis Date   Asthma 07/31/2021   Breast cancer ([place])    Family history of ovarian cancer    Family history of prostate cancer    GERD (gastroesophageal reflux disease)    History of asthma    has albuterol, uses PRN   History of heart murmur in childhood  History of multiple allergies   History of allergy related headaches   PAST SURGICAL HISTORY: Past Surgical History:  Procedure Laterality  Date   BREAST LUMPECTOMY WITH RADIOACTIVE SEED AND SENTINEL LYMPH NODE BIOPSY Left 11/19/2020   Procedure: LEFT BREAST LUMPECTOMY X 2 WITH RADIOACTIVE SEED AND SENTINEL LYMPH NODE MAPPING;  Surgeon: Erroll Luna, MD;  Location: [place];  Service: General;  Laterality: Left;  PECTORAL BLOCK   BREAST REDUCTION SURGERY Right 10/27/2021   Procedure: MAMMARY REDUCTION  TO RIGHT BREAST;  Surgeon: [name], MD;  Location: [facility];  Service: Plastics;  Laterality: Right;   LIPOSUCTION WITH LIPOFILLING Bilateral 10/27/2021   Procedure: BILATERAL FAT GRAFTING TO BREAST;  Surgeon: [name], MD;  Location: [facility];  Service: Plastics;  Laterality: Bilateral;   PORTACATH PLACEMENT Right 06/18/2020   Procedure: INSERTION PORT-A-CATH WITH ULTRASOUND GUIDANCE;  Surgeon: Erroll Luna, MD;  Location: [facility];  Service: General;  Laterality: Right;   RE-EXCISION OF BREAST LUMPECTOMY Left 12/09/2020   Procedure: RE-EXCISION OF LEFT BREAST LUMPECTOMY;  Surgeon: Erroll Luna, MD;  Location: [facility];  Service: General;  Laterality: Left;  She reports having a boil lanced from her perineal area   FAMILY HISTORY: Family History  Problem Relation Age of Onset   Lung cancer Maternal Uncle        dx late 46s   Prostate cancer Paternal Uncle        dx late 62s   Ovarian cancer Paternal Grandmother        dx 70s   Prostate cancer Paternal Grandfather        dx 31s   Prostate cancer Paternal Uncle        dx early 56s  The patient's father is 69 and her mother 66, as of 05/2020.  The patient has two brothers and one sister. She reports ovarian cancer in her paternal grandmother, prostate cancer in her paternal grandfather and a paternal uncle, and lung cancer in a maternal uncle.   GYNECOLOGIC HISTORY:  No LMP recorded. (Menstrual status: IUD). Menarche: 35 years old Age at first live birth: 35 years old [place] P  2 LMP current, experiences spotting Contraceptive: Mirena IUD in place HRT n/a  Hysterectomy? no BSO? 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 She describes herself as single. She lives at home with her children, [name] (age 54) and [name] (age 49).  She is originally from [place] her home.    ADVANCED DIRECTIVES: Not in place. She intends to name her mother, Jeanne Evans, as her HCPOA.  [name] lives in [place] and can be reached at [phone].  The patient was given the appropriate documents to complete and notarized at her discretion at the time of her visit 06/04/2020   HEALTH MAINTENANCE: Social History   Tobacco Use   Smoking status: Never   Smokeless tobacco: Never  Substance Use Topics   Alcohol use: Yes    Comment: occas   Drug use: Yes    Types: Marijuana    Comment: daily     Colonoscopy: n/a (age)  PAP: 04/2020  Bone density: n/a (age)   Allergies  Allergen Reactions   Sulfa Antibiotics Hives    Current Outpatient Medications  Medication Sig Dispense Refill   Acetaminophen (TYLENOL) 325 MG CAPS Take by mouth.  albuterol (VENTOLIN HFA) 108 (90 Base) MCG/ACT inhaler Inhale 2 puffs into the lungs every 6 (six) hours as needed for wheezing or shortness of breath. 8 g 6   fluticasone furoate-vilanterol (BREO ELLIPTA) 100-25 MCG/INH AEPB Inhale 1 puff into the lungs daily. 60 each 5   gabapentin (NEURONTIN) 300 MG capsule Take 1 capsule (300 mg total) by mouth at bedtime. 90 capsule 4   ibuprofen (ADVIL) 800 MG tablet Take 1 tablet (800 mg total) by mouth every 8 (eight) hours as needed. 30 tablet 0   montelukast (SINGULAIR) 10 MG tablet Take 1 tablet (10 mg total) by mouth at bedtime. 30 tablet 11   omeprazole (PRILOSEC) 40 MG capsule Take 1 capsule (40 mg total) by mouth at bedtime. 30 capsule 3   ondansetron  (ZOFRAN-ODT) 4 MG disintegrating tablet Take 1 tablet (4 mg total) by mouth every 8 (eight) hours as needed for nausea or vomiting. 20 tablet 0   valACYclovir (VALTREX) 500 MG tablet Take 500 mg by mouth 2 (two) times daily.     venlafaxine XR (EFFEXOR-XR) 75 MG 24 hr capsule Take 1 capsule (75 mg total) by mouth daily with breakfast. 90 capsule 4   No current facility-administered medications for this visit.   Facility-Administered Medications Ordered in Other Visits  Medication Dose Route Frequency Provider Last Rate Last Admin   heparin lock flush 100 unit/mL  500 Units Intracatheter Once PRN Leara Rawl, Virgie Dad, MD       pembrolizumab [facility] 200 mg in sodium chloride 0.9 % 50 mL chemo infusion  200 mg Intravenous Once Anaja Monts, Virgie Dad, MD 116 mL/hr at 11/05/21 1242 200 mg at 11/05/21 1242   sodium chloride flush (NS) 0.9 % injection 10 mL  10 mL Intracatheter PRN Eason Housman, Virgie Dad, MD        OBJECTIVE: African-American woman who appears stated age Vitals:   11/05/21 1139  BP: (!) 139/96  Pulse: 78  Resp: 18  Temp: 98.1 F (36.7 C)  SpO2: 100%       Body mass index is 32.36 kg/m.   Wt Readings from Last 3 Encounters:  11/05/21 182 lb 11.2 oz (82.9 kg)  10/27/21 185 lb 10 oz (84.2 kg)  10/21/21 185 lb 3.2 oz (84 kg)  ECOG FS:1 - Symptomatic but completely ambulatory  Sclerae unicteric, EOMs intact Wearing a mask No cervical or supraclavicular adenopathy Lungs no rales or rhonchi Heart regular rate and rhythm Abd soft, nontender, positive bowel sounds MSK no focal spinal tenderness, no upper extremity lymphedema Neuro: nonfocal, well oriented, appropriate affect Breasts: Status post right breast reduction and left lumpectomy and radiation with subsequent mastopexy.  The cosmetic result is excellent.  There is no evidence of disease recurrence.  Both axillae are benign.   LAB RESULTS:  CMP     Component Value Date/Time   NA 139 11/05/2021 1117   K 3.9 11/05/2021  1117   CL 106 11/05/2021 1117   CO2 27 11/05/2021 1117   GLUCOSE 97 11/05/2021 1117   BUN 15 11/05/2021 1117   CREATININE 0.73 11/05/2021 1117   CREATININE 1.03 (H) 10/14/2021 1356   CALCIUM 9.1 11/05/2021 1117   PROT 7.0 11/05/2021 1117   ALBUMIN 4.0 11/05/2021 1117   AST 13 (L) 11/05/2021 1117   AST 18 10/14/2021 1356   ALT 12 11/05/2021 1117   ALT 21 10/14/2021 1356   ALKPHOS 75 11/05/2021 1117   BILITOT 0.6 11/05/2021 1117   BILITOT 0.8 10/14/2021 1356   GFRNONAA >  60 11/05/2021 1117   GFRNONAA >60 10/14/2021 1356   GFRAA >60 08/05/2020 0955   GFRAA >60 06/04/2020 0830    No results found for: TOTALPROTELP, ALBUMINELP, A1GS, A2GS, BETS, BETA2SER, GAMS, MSPIKE, SPEI  Lab Results  Component Value Date   WBC 4.3 11/05/2021   NEUTROABS 1.9 11/05/2021   HGB 11.2 (L) 11/05/2021   HCT 33.6 (L) 11/05/2021   MCV 85.9 11/05/2021   PLT 285 11/05/2021    No results found for: LABCA2  No components found for: ZOXWRU045  No results for input(s): INR in the last 168 hours.  No results found for: LABCA2  No results found for: WUJ811  No results found for: BJY782  No results found for: NFA213  No results found for: CA2729  No components found for: HGQUANT  No results found for: CEA1 / No results found for: CEA1   No results found for: AFPTUMOR  No results found for: CHROMOGRNA  No results found for: KPAFRELGTCHN, LAMBDASER, KAPLAMBRATIO (kappa/lambda light chains)  No results found for: HGBA, HGBA2QUANT, HGBFQUANT, HGBSQUAN (Hemoglobinopathy evaluation)   No results found for: LDH  No results found for: IRON, TIBC, IRONPCTSAT (Iron and TIBC)  No results found for: FERRITIN  Urinalysis No results found for: COLORURINE, APPEARANCEUR, LABSPEC, PHURINE, GLUCOSEU, HGBUR, BILIRUBINUR, KETONESUR, PROTEINUR, UROBILINOGEN, NITRITE, LEUKOCYTESUR   STUDIES: No results found.   ELIGIBLE FOR AVAILABLE RESEARCH PROTOCOL: no  ASSESSMENT: 34 y.o. [place] woman  status post left breast upper outer quadrant biopsy 05/29/2020 for a clinical T2 N0, stage IIB invasive ductal carcinoma, functionally triple negative, with an MIB-1 of 95%.  (1) genetics testing 06/23/2020 through the [facility] Multi-Cancer Panel found no deleterious mutations in AIP, ALK, APC, ATM, AXIN2,BAP1,  BARD1, BLM, BMPR1A, BRCA1, BRCA2, BRIP1, CASR, CDC73, CDH1, CDK4, CDKN1B, CDKN1C, CDKN2A (p14ARF), CDKN2A (p16INK4a), CEBPA, CHEK2, CTNNA1, DICER1, DIS3L2, EGFR (c.2369C>T, p.Thr790Met variant only), EPCAM (Deletion/duplication testing only), FH, FLCN, GATA2, GPC3, GREM1 (Promoter region deletion/duplication testing only), HOXB13 (c.251G>A, p.Gly84Glu), HRAS, KIT, MAX, MEN1, MET, MITF (c.952G>A, p.Glu318Lys variant only), MLH1, MSH2, MSH3, MSH6, MUTYH, NBN, NF1, NF2, NTHL1, PALB2, PDGFRA, PHOX2B, PMS2, POLD1, POLE, POT1, PRKAR1A, PTCH1, PTEN, RAD50, RAD51C, RAD51D, RB1, RECQL4, RET, RNF43, RUNX1, SDHAF2, SDHA (sequence changes only), SDHB, SDHC, SDHD, SMAD4, SMARCA4, SMARCB1, SMARCE1, STK11, SUFU, TERC, TERT, TMEM127, TP53, TSC1, TSC2, VHL, WRN and WT1.   (2) neoadjuvant chemotherapy consisting of cyclophosphamide and doxorubicin in dose dense fashion x4 started 06/19/2020, completed 08/05/2020, followed by paclitaxel and carboplatin weekly x12 starting 08/26/2020, completing 4 doses 09/23/2020  (a) echocardiogram on 06/13/2020 showed an EF of 60-65%  (b) paclitaxel and carboplatin discontinued after 4 doses because of neuropathy and cytopenias  (3) status post left lumpectomy and sentinel lymph node sampling 11/19/2020 for a residual yp T1c ypN0 invasive ductal carcinoma involving the superior margin  (a) additional surgery 12/09/2020 cleared the margin in question  (b) repeat prognostic panel confirms triple negative disease  (c) status post left mastopexy and right breast reduction on 10/27/2021 with fat grafting bilaterally  (4) adjuvant radiation : 01/22/2021 through 03/10/2021 Site Technique  Total Dose (Gy) Dose per Fx (Gy) Completed Fx Beam Energies  Breast, Left: Breast_Lt 3D 50.4/50.4 1.8 28/28 6X  Breast, Left: Breast_Lt_Bst 3D 10/10 2 5/5 6X   (a) received capecitabine sensitization, started 01/26/2021  (5) pembrolizumab/Keytruda began on 04/16/2021, held 05/11/2021 (after 2 doses) with persistent cough, later attributed to asthma (A) pembrolizumab resumed 08/06/2021 (B) PD-L1 combined score is 10  (6) molecular pathology from the 11/19/2020 sample showed  a mutation in [place].  There were no mutations in BRCA1 or 2, ERBB2 or PIK3CA.  The microsatellite status was equivocal and the tumor mutational burden could not be determined on the sample.  (7) sarcoidosis? (followed by pulmonary: Ramaswamy)  (A) chest CT 06/13/2020 shows in addition to the left breast mass multiple ill-defined pulmonary nodules, pleural nodularity and symmetrical by hilar and mediastinal adenopathy consistent with sarcoidosis. (B) chest CT 05/29/2021 shows stable mediastinal and hilar adenopathy as well as improved liver lesions.  No evidence of metastatic disease.  PLAN: Lakeshia is coming up on a year from definitive surgery for her breast cancer with no evidence of disease recurrence.  This is very favorable.  She is tolerating the pembrolizumab well, and the plan is to continue that through June 2023.  She is very pleased with her recent plastic surgery, and is recovering uneventfully.    She will return in 3 and 6 weeks for Keytruda and then in 9 weeks for the same plus a visit.  She knows to call us for any other issue that may develop before then  Total encounter time 20 minutes.Sarajane Jews C. Kasi Lasky, MD 11/05/21 12:56 PM Medical Oncology and Hematology [facility] [place] [address] Tel. [phone]    Fax. [phone]   I, Wilburn Mylar, am acting as scribe for Dr. Virgie Dad. Rahel Carlton.   *Total Encounter Time as defined by the Centers  for Medicare and Medicaid Services includes, in addition to the face-to-face time of a patient visit (documented in the note above) non-face-to-face time: obtaining and reviewing outside history, ordering and reviewing medications, tests or procedures, care coordination (communications with other health care professionals or caregivers) and documentation in the medical record.

## 2021-11-04 NOTE — Progress Notes (Signed)
Patient presents 1 week postop from bilateral mastopexy and fat grafting to bilateral breast.  She feels great.  Overall she is very happy.  She has some residual pain and tenderness in the abdomen.  On exam all of her incisions look great.  Nipple areolar complexes are viable.  Minimal swelling and no signs of subcutaneous fluid.  Minimal bruising in her abdomen.  Overall everything looks great we will see her again in a few weeks.  All of her questions were answered.

## 2021-11-05 ENCOUNTER — Inpatient Hospital Stay: Payer: 59

## 2021-11-05 ENCOUNTER — Inpatient Hospital Stay (HOSPITAL_BASED_OUTPATIENT_CLINIC_OR_DEPARTMENT_OTHER): Payer: 59 | Admitting: Oncology

## 2021-11-05 VITALS — BP 139/96 | HR 78 | Temp 98.1°F | Resp 18 | Ht 63.0 in | Wt 182.7 lb

## 2021-11-05 DIAGNOSIS — Z171 Estrogen receptor negative status [ER-]: Secondary | ICD-10-CM

## 2021-11-05 DIAGNOSIS — C50412 Malignant neoplasm of upper-outer quadrant of left female breast: Secondary | ICD-10-CM

## 2021-11-05 DIAGNOSIS — Z95828 Presence of other vascular implants and grafts: Secondary | ICD-10-CM

## 2021-11-05 DIAGNOSIS — Z5112 Encounter for antineoplastic immunotherapy: Secondary | ICD-10-CM | POA: Diagnosis not present

## 2021-11-05 LAB — TSH: TSH: 0.515 u[IU]/mL (ref 0.308–3.960)

## 2021-11-05 LAB — CBC WITH DIFFERENTIAL/PLATELET
Abs Immature Granulocytes: 0.01 10*3/uL (ref 0.00–0.07)
Basophils Absolute: 0 10*3/uL (ref 0.0–0.1)
Basophils Relative: 1 %
Eosinophils Absolute: 0.4 10*3/uL (ref 0.0–0.5)
Eosinophils Relative: 8 %
HCT: 33.6 % — ABNORMAL LOW (ref 36.0–46.0)
Hemoglobin: 11.2 g/dL — ABNORMAL LOW (ref 12.0–15.0)
Immature Granulocytes: 0 %
Lymphocytes Relative: 38 %
Lymphs Abs: 1.6 10*3/uL (ref 0.7–4.0)
MCH: 28.6 pg (ref 26.0–34.0)
MCHC: 33.3 g/dL (ref 30.0–36.0)
MCV: 85.9 fL (ref 80.0–100.0)
Monocytes Absolute: 0.4 10*3/uL (ref 0.1–1.0)
Monocytes Relative: 10 %
Neutro Abs: 1.9 10*3/uL (ref 1.7–7.7)
Neutrophils Relative %: 43 %
Platelets: 285 10*3/uL (ref 150–400)
RBC: 3.91 MIL/uL (ref 3.87–5.11)
RDW: 14.7 % (ref 11.5–15.5)
WBC: 4.3 10*3/uL (ref 4.0–10.5)
nRBC: 0 % (ref 0.0–0.2)

## 2021-11-05 LAB — COMPREHENSIVE METABOLIC PANEL
ALT: 12 U/L (ref 0–44)
AST: 13 U/L — ABNORMAL LOW (ref 15–41)
Albumin: 4 g/dL (ref 3.5–5.0)
Alkaline Phosphatase: 75 U/L (ref 38–126)
Anion gap: 6 (ref 5–15)
BUN: 15 mg/dL (ref 6–20)
CO2: 27 mmol/L (ref 22–32)
Calcium: 9.1 mg/dL (ref 8.9–10.3)
Chloride: 106 mmol/L (ref 98–111)
Creatinine, Ser: 0.73 mg/dL (ref 0.44–1.00)
GFR, Estimated: >60 mL/min (ref >60)
Glucose, Bld: 97 mg/dL (ref 70–99)
Potassium: 3.9 mmol/L (ref 3.5–5.1)
Sodium: 139 mmol/L (ref 135–145)
Total Bilirubin: 0.6 mg/dL (ref 0.3–1.2)
Total Protein: 7 g/dL (ref 6.5–8.1)

## 2021-11-05 MED ORDER — SODIUM CHLORIDE 0.9% FLUSH
10.0000 mL | INTRAVENOUS | Status: DC | PRN
  Administered 2021-11-05: 13:00:00 10 mL

## 2021-11-05 MED ORDER — HEPARIN SOD (PORK) LOCK FLUSH 100 UNIT/ML IV SOLN
500.0000 [IU] | Freq: Once | INTRAVENOUS | Status: AC | PRN
  Administered 2021-11-05: 13:00:00 500 [IU]

## 2021-11-05 MED ORDER — SODIUM CHLORIDE 0.9 % IV SOLN
200.0000 mg | Freq: Once | INTRAVENOUS | Status: AC
  Administered 2021-11-05: 13:00:00 200 mg via INTRAVENOUS
  Filled 2021-11-05: qty 8

## 2021-11-05 MED ORDER — SODIUM CHLORIDE 0.9 % IV SOLN: Freq: Once | INTRAVENOUS | Status: AC

## 2021-11-05 MED ORDER — HEPARIN SOD (PORK) LOCK FLUSH 100 UNIT/ML IV SOLN: 500.0000 [IU] | INTRAVENOUS | Status: DC | PRN

## 2021-11-05 MED ORDER — SODIUM CHLORIDE 0.9% FLUSH
10.0000 mL | INTRAVENOUS | Status: AC | PRN
  Administered 2021-11-05: 11:00:00 10 mL

## 2021-11-05 NOTE — Patient Instructions (Signed)
[facility]  Discharge Instructions: Thank you for choosing [facility] to provide your oncology and hematology care.   If you have a lab appointment with the [facility], please go directly to the [facility] and check in at the registration area.   Wear comfortable clothing and clothing appropriate for easy access to any Portacath or PICC line.   We strive to give you quality time with your provider. You may need to reschedule your appointment if you arrive late (15 or more minutes).  Arriving late affects you and other patients whose appointments are after yours.  Also, if you miss three or more appointments without notifying the office, you may be dismissed from the clinic at the providers discretion.      For prescription refill requests, have your pharmacy contact our office and allow 72 hours for refills to be completed.    Today you received the following chemotherapy and/or immunotherapy agents: Keytruda      To help prevent nausea and vomiting after your treatment, we encourage you to take your nausea medication as directed.  BELOW ARE SYMPTOMS THAT SHOULD BE REPORTED IMMEDIATELY: *FEVER GREATER THAN 100.4 F (38 C) OR HIGHER *CHILLS OR SWEATING *NAUSEA AND VOMITING THAT IS NOT CONTROLLED WITH YOUR NAUSEA MEDICATION *UNUSUAL SHORTNESS OF BREATH *UNUSUAL BRUISING OR BLEEDING *URINARY PROBLEMS (pain or burning when urinating, or frequent urination) *BOWEL PROBLEMS (unusual diarrhea, constipation, pain near the anus) TENDERNESS IN MOUTH AND THROAT WITH OR WITHOUT PRESENCE OF ULCERS (sore throat, sores in mouth, or a toothache) UNUSUAL RASH, SWELLING OR PAIN  UNUSUAL VAGINAL DISCHARGE OR ITCHING   Items with * indicate a potential emergency and should be followed up as soon as possible or go to the Emergency Department if any problems should occur.  Please show the CHEMOTHERAPY ALERT CARD or IMMUNOTHERAPY ALERT CARD at check-in to  the Emergency Department and triage nurse.  Should you have questions after your visit or need to cancel or reschedule your appointment, please contact [facility]  Dept: [phone]  and follow the prompts.  Office hours are 8:00 a.m. to 4:30 p.m. Monday - Friday. Please note that voicemails left after 4:00 p.m. may not be returned until the following business day.  We are closed weekends and major holidays. You have access to a nurse at all times for urgent questions. Please call the main number to the clinic Dept: [phone] and follow the prompts.   For any non-urgent questions, you may also contact your provider using MyChart. We now offer e-Visits for anyone 17 and older to request care online for non-urgent symptoms. For details visit [email or web address].   Also download the MyChart app! Go to the app store, search "MyChart", open the app, select [facility], and log in with your MyChart username and password.  Due to Covid, a mask is required upon entering the hospital/clinic. If you do not have a mask, one will be given to you upon arrival. For doctor visits, patients may have 1 support person aged 33 or older with them. For treatment visits, patients cannot have anyone with them due to current Covid guidelines and our immunocompromised population.   Influenza (Flu) Vaccine (Inactivated or Recombinant): What You Need to Know 1. Why get vaccinated? Influenza vaccine can prevent influenza (flu). Flu is a contagious disease that spreads around the [place] every year, usually between October and May. Anyone can get the flu, but it is more dangerous  for some people. Infants and young children, people 6 years and older, pregnant people, and people with certain health conditions or a weakened immune system are at greatest risk of flu complications. Pneumonia, bronchitis, sinus infections, and ear infections are examples of flu-related complications. If you  have a medical condition, such as heart disease, cancer, or diabetes, flu can make it worse. Flu can cause fever and chills, sore throat, muscle aches, fatigue, cough, headache, and runny or stuffy nose. Some people may have vomiting and diarrhea, though this is more common in children than adults. In an average year, thousands of people in the [place] die from flu, and many more are hospitalized. Flu vaccine prevents millions of illnesses and flu-related visits to the doctor each year. 2. Influenza vaccines CDC recommends everyone 6 months and older get vaccinated every flu season. Children 6 months through 59 years of age may need 2 doses during a single flu season. Everyone else needs only 1 dose each flu season. It takes about 2 weeks for protection to develop after vaccination. There are many flu viruses, and they are always changing. Each year a new flu vaccine is made to protect against the influenza viruses believed to be likely to cause disease in the upcoming flu season. Even when the vaccine doesn't exactly match these viruses, it may still provide some protection. Influenza vaccine does not cause flu. Influenza vaccine may be given at the same time as other vaccines. 3. Talk with your health care provider Tell your vaccination provider if the person getting the vaccine: Has had an allergic reaction after a previous dose of influenza vaccine, or has any severe, life-threatening allergies Has ever had Guillain-Barr Syndrome (also called "GBS") In some cases, your health care provider may decide to postpone influenza vaccination until a future visit. Influenza vaccine can be administered at any time during pregnancy. People who are or will be pregnant during influenza season should receive inactivated influenza vaccine. People with minor illnesses, such as a cold, may be vaccinated. People who are moderately or severely ill should usually wait until they recover before getting influenza  vaccine. Your health care provider can give you more information. 4. Risks of a vaccine reaction Soreness, redness, and swelling where the shot is given, fever, muscle aches, and headache can happen after influenza vaccination. There may be a very small increased risk of Guillain-Barr Syndrome (GBS) after inactivated influenza vaccine (the flu shot). Young children who get the flu shot along with pneumococcal vaccine (PCV13) and/or DTaP vaccine at the same time might be slightly more likely to have a seizure caused by fever. Tell your health care provider if a child who is getting flu vaccine has ever had a seizure. People sometimes faint after medical procedures, including vaccination. Tell your provider if you feel dizzy or have vision changes or ringing in the ears. As with any medicine, there is a very remote chance of a vaccine causing a severe allergic reaction, other serious injury, or death. 5. What if there is a serious problem? An allergic reaction could occur after the vaccinated person leaves the clinic. If you see signs of a severe allergic reaction (hives, swelling of the face and throat, difficulty breathing, a fast heartbeat, dizziness, or weakness), call 9-1-1 and get the person to the nearest hospital. For other signs that concern you, call your health care provider. Adverse reactions should be reported to the Vaccine Adverse Event Reporting System (VAERS). Your health care provider will usually file this  report, or you can do it yourself. Visit the VAERS website at www.vaers.SamedayNews.es or call 910-604-9207. VAERS is only for reporting reactions, and VAERS staff members do not give medical advice. 6. The National Vaccine Injury Compensation Program The [employer] Vaccine Injury Compensation Program (VICP) is a federal program that was created to compensate people who may have been injured by certain vaccines. Claims regarding alleged injury or death due to vaccination have a time limit  for filing, which may be as short as two years. Visit the VICP website at GoldCloset.com.ee or call 639-125-4835 to learn about the program and about filing a claim. 7. How can I learn more? Ask your health care provider. Call your local or state health department. Visit the website of the Food and Drug Administration (FDA) for vaccine package inserts and additional information at TraderRating.uy. Contact the Centers for Disease Control and Prevention (CDC): Call 304-427-9943 (1-800-CDC-INFO) or Visit CDC's website at https://gibson.com/. Vaccine Information Statement Inactivated Influenza Vaccine (06/20/2020) This information is not intended to replace advice given to you by your health care provider. Make sure you discuss any questions you have with your health care provider. Document Revised: 07/23/2021 Document Reviewed: 07/23/2021 Elsevier Patient Education  2022 [employer].

## 2021-11-11 ENCOUNTER — Encounter: Payer: 59 | Admitting: Surgical

## 2021-11-11 ENCOUNTER — Telehealth: Payer: Self-pay | Admitting: Oncology

## 2021-11-11 NOTE — Telephone Encounter (Signed)
Scheduled appointment per 12/22 los. Patient is aware. 

## 2021-11-17 ENCOUNTER — Encounter: Payer: Self-pay | Admitting: Licensed Clinical Social Worker

## 2021-11-17 ENCOUNTER — Ambulatory Visit: Payer: 59

## 2021-11-17 ENCOUNTER — Encounter: Payer: Self-pay | Admitting: Oncology

## 2021-11-17 NOTE — Progress Notes (Signed)
[facility] CSW Progress Note  Clinical Education officer, museum contacted patient by phone to follow-up on pt request re: resources to help with backpay on a light bill. CSW shared information on crisis intervention program through DSS and how to apply online or in person at [facility]. Pt expressed understanding and will work on applying. She will contact this CSW if this avenue does not provide assistance.    Christeen Douglas , LCSW

## 2021-11-21 ENCOUNTER — Other Ambulatory Visit: Payer: Self-pay | Admitting: Oncology

## 2021-11-23 ENCOUNTER — Encounter: Payer: Self-pay | Admitting: Oncology

## 2021-11-24 ENCOUNTER — Other Ambulatory Visit: Payer: Self-pay

## 2021-11-24 DIAGNOSIS — Z171 Estrogen receptor negative status [ER-]: Secondary | ICD-10-CM

## 2021-11-25 ENCOUNTER — Other Ambulatory Visit: Payer: Self-pay

## 2021-11-25 ENCOUNTER — Encounter: Payer: Self-pay | Admitting: Oncology

## 2021-11-25 ENCOUNTER — Inpatient Hospital Stay: Payer: 59 | Attending: Oncology

## 2021-11-25 ENCOUNTER — Ambulatory Visit: Payer: 59

## 2021-11-25 ENCOUNTER — Inpatient Hospital Stay: Payer: 59

## 2021-11-25 VITALS — BP 142/95 | HR 79 | Temp 98.8°F | Resp 18 | Wt 185.5 lb

## 2021-11-25 DIAGNOSIS — Z171 Estrogen receptor negative status [ER-]: Secondary | ICD-10-CM

## 2021-11-25 DIAGNOSIS — Z5112 Encounter for antineoplastic immunotherapy: Secondary | ICD-10-CM | POA: Insufficient documentation

## 2021-11-25 DIAGNOSIS — Z79899 Other long term (current) drug therapy: Secondary | ICD-10-CM | POA: Insufficient documentation

## 2021-11-25 DIAGNOSIS — C50412 Malignant neoplasm of upper-outer quadrant of left female breast: Secondary | ICD-10-CM | POA: Diagnosis present

## 2021-11-25 DIAGNOSIS — Z95828 Presence of other vascular implants and grafts: Secondary | ICD-10-CM

## 2021-11-25 LAB — COMPREHENSIVE METABOLIC PANEL
ALT: 14 U/L (ref 0–44)
AST: 15 U/L (ref 15–41)
Albumin: 4.3 g/dL (ref 3.5–5.0)
Alkaline Phosphatase: 73 U/L (ref 38–126)
Anion gap: 6 (ref 5–15)
BUN: 11 mg/dL (ref 6–20)
CO2: 26 mmol/L (ref 22–32)
Calcium: 9.1 mg/dL (ref 8.9–10.3)
Chloride: 105 mmol/L (ref 98–111)
Creatinine, Ser: 0.83 mg/dL (ref 0.44–1.00)
GFR, Estimated: >60 mL/min (ref >60)
Glucose, Bld: 98 mg/dL (ref 70–99)
Potassium: 3.7 mmol/L (ref 3.5–5.1)
Sodium: 137 mmol/L (ref 135–145)
Total Bilirubin: 1 mg/dL (ref 0.3–1.2)
Total Protein: 7.3 g/dL (ref 6.5–8.1)

## 2021-11-25 LAB — CBC WITH DIFFERENTIAL (CANCER CENTER ONLY)
Abs Immature Granulocytes: 0.01 10*3/uL (ref 0.00–0.07)
Basophils Absolute: 0 10*3/uL (ref 0.0–0.1)
Basophils Relative: 1 %
Eosinophils Absolute: 0.3 10*3/uL (ref 0.0–0.5)
Eosinophils Relative: 7 %
HCT: 33.2 % — ABNORMAL LOW (ref 36.0–46.0)
Hemoglobin: 11.2 g/dL — ABNORMAL LOW (ref 12.0–15.0)
Immature Granulocytes: 0 %
Lymphocytes Relative: 42 %
Lymphs Abs: 1.8 10*3/uL (ref 0.7–4.0)
MCH: 28.7 pg (ref 26.0–34.0)
MCHC: 33.7 g/dL (ref 30.0–36.0)
MCV: 85.1 fL (ref 80.0–100.0)
Monocytes Absolute: 0.5 10*3/uL (ref 0.1–1.0)
Monocytes Relative: 12 %
Neutro Abs: 1.6 10*3/uL — ABNORMAL LOW (ref 1.7–7.7)
Neutrophils Relative %: 38 %
Platelet Count: 251 10*3/uL (ref 150–400)
RBC: 3.9 MIL/uL (ref 3.87–5.11)
RDW: 14.7 % (ref 11.5–15.5)
WBC Count: 4.2 10*3/uL (ref 4.0–10.5)
nRBC: 0 % (ref 0.0–0.2)

## 2021-11-25 LAB — TSH: TSH: 0.707 u[IU]/mL (ref 0.308–3.960)

## 2021-11-25 MED ORDER — SODIUM CHLORIDE 0.9 % IV SOLN: Freq: Once | INTRAVENOUS | Status: AC

## 2021-11-25 MED ORDER — SODIUM CHLORIDE 0.9% FLUSH: 10.0000 mL | INTRAVENOUS | Status: DC | PRN

## 2021-11-25 MED ORDER — HEPARIN SOD (PORK) LOCK FLUSH 100 UNIT/ML IV SOLN
500.0000 [IU] | Freq: Once | INTRAVENOUS | Status: AC | PRN
  Administered 2021-11-25: 500 [IU]

## 2021-11-25 MED ORDER — SODIUM CHLORIDE 0.9% FLUSH
10.0000 mL | INTRAVENOUS | Status: DC | PRN
  Administered 2021-11-25: 10 mL

## 2021-11-25 MED ORDER — SODIUM CHLORIDE 0.9 % IV SOLN
200.0000 mg | Freq: Once | INTRAVENOUS | Status: AC
  Administered 2021-11-25: 200 mg via INTRAVENOUS
  Filled 2021-11-25: qty 8

## 2021-11-25 MED ORDER — SODIUM CHLORIDE 0.9% FLUSH
10.0000 mL | INTRAVENOUS | Status: AC | PRN
  Administered 2021-11-25: 10 mL

## 2021-11-25 NOTE — Patient Instructions (Signed)
[facility]  Discharge Instructions: ?Thank you for choosing [facility] to provide your oncology and hematology care.  ? ?If you have a lab appointment with the Cancer Center, please go directly to the Cancer Center and check in at the registration area. ?  ?Wear comfortable clothing and clothing appropriate for easy access to any Portacath or PICC line.  ? ?We strive to give you quality time with your provider. You may need to reschedule your appointment if you arrive late (15 or more minutes).  Arriving late affects you and other patients whose appointments are after yours.  Also, if you miss three or more appointments without notifying the office, you may be dismissed from the clinic at the provider?s discretion.    ?  ?For prescription refill requests, have your pharmacy contact our office and allow 72 hours for refills to be completed.   ? ?Today you received the following chemotherapy and/or immunotherapy agents: Keytruda ?  ?To help prevent nausea and vomiting after your treatment, we encourage you to take your nausea medication as directed. ? ?BELOW ARE SYMPTOMS THAT SHOULD BE REPORTED IMMEDIATELY: ?*FEVER GREATER THAN 100.4 F (38 ?C) OR HIGHER ?*CHILLS OR SWEATING ?*NAUSEA AND VOMITING THAT IS NOT CONTROLLED WITH YOUR NAUSEA MEDICATION ?*UNUSUAL SHORTNESS OF BREATH ?*UNUSUAL BRUISING OR BLEEDING ?*URINARY PROBLEMS (pain or burning when urinating, or frequent urination) ?*BOWEL PROBLEMS (unusual diarrhea, constipation, pain near the anus) ?TENDERNESS IN MOUTH AND THROAT WITH OR WITHOUT PRESENCE OF ULCERS (sore throat, sores in mouth, or a toothache) ?UNUSUAL RASH, SWELLING OR PAIN  ?UNUSUAL VAGINAL DISCHARGE OR ITCHING  ? ?Items with * indicate a potential emergency and should be followed up as soon as possible or go to the Emergency Department if any problems should occur. ? ?Please show the CHEMOTHERAPY ALERT CARD or IMMUNOTHERAPY ALERT CARD at check-in to the  Emergency Department and triage nurse. ? ?Should you have questions after your visit or need to cancel or reschedule your appointment, please contact [facility]  Dept: [phone]  and follow the prompts.  Office hours are 8:00 a.m. to 4:30 p.m. Monday - Friday. Please note that voicemails left after 4:00 p.m. may not be returned until the following business day.  We are closed weekends and major holidays. You have access to a nurse at all times for urgent questions. Please call the main number to the clinic Dept: [phone] and follow the prompts. ? ? ?For any non-urgent questions, you may also contact your provider using MyChart. We now offer e-Visits for anyone 18 and older to request care online for non-urgent symptoms. For details visit [email or web address]. ?  ?Also download the MyChart app! Go to the app store, search "MyChart", open the app, select [facility], and log in with your MyChart username and password. ? ?Due to Covid, a mask is required upon entering the hospital/clinic. If you do not have a mask, one will be given to you upon arrival. For doctor visits, patients may have 1 support person aged 18 or older with them. For treatment visits, patients cannot have anyone with them due to current Covid guidelines and our immunocompromised population.  ? ?

## 2021-11-27 ENCOUNTER — Encounter: Payer: Self-pay | Admitting: Surgical

## 2021-12-01 ENCOUNTER — Ambulatory Visit: Payer: 59

## 2021-12-01 ENCOUNTER — Encounter: Payer: Self-pay | Admitting: *Deleted

## 2021-12-02 NOTE — Progress Notes (Signed)
Patient is a 36 year old female here for follow-up after right breast reduction, left breast mastopexy and fat grafting to bilateral breasts for breast asymmetry after treatment of left breast cancer.  Surgery was on 10/27/2021.  She is approximately 5 and half weeks postop.  She reports that overall she is doing really well, she is very pleased with her outcome.  She reports some itching as well as some tenderness on the inferior portion of the right breast at the T-junction.  She is not having any infectious symptoms.  Chaperone present on exam On exam bilateral breast incisions are intact, bilateral NAC's are viable.  No wounds are noted.  He has a small suture protruding through the T-junction of the right breast.  No subcutaneous fluid collection noted with palpation.    There is no signs of infection on exam.  Recommend continue to avoid strenuous activities, continue with compression garments.  We discussed that over the next few weeks she may notice some changes related to swelling as well as some changes to the areas of fat grafting due to blood supply. We will plan to reevaluate and 4 to 6 weeks.  Patient knows to call with any questions or concerns.  Overall she is doing really well.  Pictures were obtained of the patient and placed in the chart with the patient's or guardian's permission.

## 2021-12-04 ENCOUNTER — Other Ambulatory Visit: Payer: Self-pay

## 2021-12-04 ENCOUNTER — Ambulatory Visit (INDEPENDENT_AMBULATORY_CARE_PROVIDER_SITE_OTHER): Payer: 59 | Admitting: Surgical

## 2021-12-04 DIAGNOSIS — Z171 Estrogen receptor negative status [ER-]: Secondary | ICD-10-CM

## 2021-12-04 DIAGNOSIS — C50412 Malignant neoplasm of upper-outer quadrant of left female breast: Secondary | ICD-10-CM

## 2021-12-17 ENCOUNTER — Other Ambulatory Visit: Payer: Self-pay

## 2021-12-17 ENCOUNTER — Other Ambulatory Visit: Payer: Self-pay | Admitting: Hematology and Oncology

## 2021-12-17 ENCOUNTER — Inpatient Hospital Stay: Payer: 59

## 2021-12-17 ENCOUNTER — Inpatient Hospital Stay: Payer: 59 | Attending: Oncology

## 2021-12-17 VITALS — BP 122/80 | HR 72 | Temp 98.6°F | Resp 18 | Wt 186.5 lb

## 2021-12-17 DIAGNOSIS — Z95828 Presence of other vascular implants and grafts: Secondary | ICD-10-CM

## 2021-12-17 DIAGNOSIS — Z79899 Other long term (current) drug therapy: Secondary | ICD-10-CM | POA: Diagnosis not present

## 2021-12-17 DIAGNOSIS — C50412 Malignant neoplasm of upper-outer quadrant of left female breast: Secondary | ICD-10-CM | POA: Insufficient documentation

## 2021-12-17 DIAGNOSIS — Z5112 Encounter for antineoplastic immunotherapy: Secondary | ICD-10-CM | POA: Diagnosis not present

## 2021-12-17 DIAGNOSIS — Z171 Estrogen receptor negative status [ER-]: Secondary | ICD-10-CM

## 2021-12-17 LAB — CBC WITH DIFFERENTIAL (CANCER CENTER ONLY)
Abs Immature Granulocytes: 0 10*3/uL (ref 0.00–0.07)
Basophils Absolute: 0 10*3/uL (ref 0.0–0.1)
Basophils Relative: 1 %
Eosinophils Absolute: 0.5 10*3/uL (ref 0.0–0.5)
Eosinophils Relative: 11 %
HCT: 35.7 % — ABNORMAL LOW (ref 36.0–46.0)
Hemoglobin: 11.9 g/dL — ABNORMAL LOW (ref 12.0–15.0)
Immature Granulocytes: 0 %
Lymphocytes Relative: 39 %
Lymphs Abs: 1.8 10*3/uL (ref 0.7–4.0)
MCH: 28.7 pg (ref 26.0–34.0)
MCHC: 33.3 g/dL (ref 30.0–36.0)
MCV: 86.2 fL (ref 80.0–100.0)
Monocytes Absolute: 0.4 10*3/uL (ref 0.1–1.0)
Monocytes Relative: 9 %
Neutro Abs: 1.8 10*3/uL (ref 1.7–7.7)
Neutrophils Relative %: 40 %
Platelet Count: 249 10*3/uL (ref 150–400)
RBC: 4.14 MIL/uL (ref 3.87–5.11)
RDW: 14.9 % (ref 11.5–15.5)
WBC Count: 4.5 10*3/uL (ref 4.0–10.5)
nRBC: 0 % (ref 0.0–0.2)

## 2021-12-17 LAB — COMPREHENSIVE METABOLIC PANEL
ALT: 14 U/L (ref 0–44)
AST: 15 U/L (ref 15–41)
Albumin: 4.3 g/dL (ref 3.5–5.0)
Alkaline Phosphatase: 88 U/L (ref 38–126)
Anion gap: 5 (ref 5–15)
BUN: 9 mg/dL (ref 6–20)
CO2: 29 mmol/L (ref 22–32)
Calcium: 9.1 mg/dL (ref 8.9–10.3)
Chloride: 106 mmol/L (ref 98–111)
Creatinine, Ser: 0.92 mg/dL (ref 0.44–1.00)
GFR, Estimated: >60 mL/min (ref >60)
Glucose, Bld: 96 mg/dL (ref 70–99)
Potassium: 3.9 mmol/L (ref 3.5–5.1)
Sodium: 140 mmol/L (ref 135–145)
Total Bilirubin: 0.7 mg/dL (ref 0.3–1.2)
Total Protein: 7.3 g/dL (ref 6.5–8.1)

## 2021-12-17 LAB — TSH: TSH: 0.612 u[IU]/mL (ref 0.308–3.960)

## 2021-12-17 MED ORDER — SODIUM CHLORIDE 0.9 % IV SOLN
200.0000 mg | Freq: Once | INTRAVENOUS | Status: AC
  Administered 2021-12-17: 200 mg via INTRAVENOUS
  Filled 2021-12-17: qty 8

## 2021-12-17 MED ORDER — HEPARIN SOD (PORK) LOCK FLUSH 100 UNIT/ML IV SOLN
500.0000 [IU] | Freq: Once | INTRAVENOUS | Status: AC | PRN
  Administered 2021-12-17: 500 [IU]

## 2021-12-17 MED ORDER — SODIUM CHLORIDE 0.9 % IV SOLN: Freq: Once | INTRAVENOUS | Status: AC

## 2021-12-17 MED ORDER — SODIUM CHLORIDE 0.9% FLUSH
10.0000 mL | INTRAVENOUS | Status: DC | PRN
  Administered 2021-12-17: 10 mL

## 2021-12-17 MED ORDER — SODIUM CHLORIDE 0.9% FLUSH
10.0000 mL | INTRAVENOUS | Status: AC | PRN
  Administered 2021-12-17: 10 mL

## 2021-12-17 NOTE — Progress Notes (Signed)
Keytruda orders added

## 2021-12-17 NOTE — Patient Instructions (Signed)
[facility]  Discharge Instructions: Thank you for choosing [facility] to provide your oncology and hematology care.   If you have a lab appointment with the Cancer Center, please go directly to the Cancer Center and check in at the registration area.   Wear comfortable clothing and clothing appropriate for easy access to any Portacath or PICC line.   We strive to give you quality time with your provider. You may need to reschedule your appointment if you arrive late (15 or more minutes).  Arriving late affects you and other patients whose appointments are after yours.  Also, if you miss three or more appointments without notifying the office, you may be dismissed from the clinic at the provider's discretion.      For prescription refill requests, have your pharmacy contact our office and allow 72 hours for refills to be completed.    Today you received the following chemotherapy and/or immunotherapy agents: keytruda      To help prevent nausea and vomiting after your treatment, we encourage you to take your nausea medication as directed.  BELOW ARE SYMPTOMS THAT SHOULD BE REPORTED IMMEDIATELY: *FEVER GREATER THAN 100.4 F (38 C) OR HIGHER *CHILLS OR SWEATING *NAUSEA AND VOMITING THAT IS NOT CONTROLLED WITH YOUR NAUSEA MEDICATION *UNUSUAL SHORTNESS OF BREATH *UNUSUAL BRUISING OR BLEEDING *URINARY PROBLEMS (pain or burning when urinating, or frequent urination) *BOWEL PROBLEMS (unusual diarrhea, constipation, pain near the anus) TENDERNESS IN MOUTH AND THROAT WITH OR WITHOUT PRESENCE OF ULCERS (sore throat, sores in mouth, or a toothache) UNUSUAL RASH, SWELLING OR PAIN  UNUSUAL VAGINAL DISCHARGE OR ITCHING   Items with * indicate a potential emergency and should be followed up as soon as possible or go to the Emergency Department if any problems should occur.  Please show the CHEMOTHERAPY ALERT CARD or IMMUNOTHERAPY ALERT CARD at check-in to  the Emergency Department and triage nurse.  Should you have questions after your visit or need to cancel or reschedule your appointment, please contact [facility]  Dept: [phone]  and follow the prompts.  Office hours are 8:00 a.m. to 4:30 p.m. Monday - Friday. Please note that voicemails left after 4:00 p.m. may not be returned until the following business day.  We are closed weekends and major holidays. You have access to a nurse at all times for urgent questions. Please call the main number to the clinic Dept: [phone] and follow the prompts.   For any non-urgent questions, you may also contact your provider using MyChart. We now offer e-Visits for anyone 18 and older to request care online for non-urgent symptoms. For details visit [email or web address].   Also download the MyChart app! Go to the app store, search "MyChart", open the app, select [facility], and log in with your MyChart username and password.  Due to Covid, a mask is required upon entering the hospital/clinic. If you do not have a mask, one will be given to you upon arrival. For doctor visits, patients may have 1 support person aged 18 or older with them. For treatment visits, patients cannot have anyone with them due to current Covid guidelines and our immunocompromised population.   

## 2021-12-31 ENCOUNTER — Other Ambulatory Visit: Payer: Self-pay | Admitting: Oncology

## 2022-01-04 NOTE — Progress Notes (Unsigned)
Cardiology Office Note:    Date:  01/04/2022   ID:  [name], DOB Oct 22, 1986, MRN [id number]  PCP:  [name], MD  Cardiologist:  None  Electrophysiologist:  None   Referring MD: [name], MD   No chief complaint on file.   History of Present Illness:    Jeanne Evans is a 36 y.o. female with a hx of breast cancer who presents for follow-up.  She was referred by Dr. Alwyn Pea for evaluation of dyspnea, initially seen on 07/07/2021.  She reports she has been having issues with coughing since she was started on chemotherapy for breast cancer.  She underwent surgery/radiation/chemo.  She saw pulmonology, thought to have asthma versus possible sarcoid.  She was started on inhalers, reports has been helping.  Reports she gets short of breath when she is having coughing spells but also notes that sometimes occurs with exertion.  She denies any chest pain except when having severe coughing spells.  Denies any lightheadedness or syncope.  Does report some ankle swelling.  Denies any palpitations.  Denies any cigarette use.  Smokes marijuana daily.  Works as a Sports coach for [employer].  No history of heart disease in her immediate family.  Echocardiogram 07/28/2021 showed normal biventricular function, no significant valvular disease.  Since last clinic visit,   Past Medical History:  Diagnosis Date   Asthma 07/31/2021   Breast cancer ([facility])    Family history of ovarian cancer    Family history of prostate cancer    GERD (gastroesophageal reflux disease)    History of asthma    has albuterol, uses PRN   History of heart murmur in childhood    History of multiple allergies     Past Surgical History:  Procedure Laterality Date   BREAST LUMPECTOMY WITH RADIOACTIVE SEED AND SENTINEL LYMPH NODE BIOPSY Left 11/19/2020   Procedure: LEFT BREAST LUMPECTOMY X 2 WITH RADIOACTIVE SEED AND SENTINEL LYMPH NODE MAPPING;  Surgeon: Erroll Luna, MD;  Location: [facility];  Service: General;  Laterality: Left;  PECTORAL BLOCK   BREAST REDUCTION SURGERY Right 10/27/2021   Procedure: MAMMARY REDUCTION  TO RIGHT BREAST;  Surgeon: [name], MD;  Location: [facility];  Service: Plastics;  Laterality: Right;   LIPOSUCTION WITH LIPOFILLING Bilateral 10/27/2021   Procedure: BILATERAL FAT GRAFTING TO BREAST;  Surgeon: [name], MD;  Location: [facility];  Service: Plastics;  Laterality: Bilateral;   PORTACATH PLACEMENT Right 06/18/2020   Procedure: INSERTION PORT-A-CATH WITH ULTRASOUND GUIDANCE;  Surgeon: Erroll Luna, MD;  Location: [facility];  Service: General;  Laterality: Right;   RE-EXCISION OF BREAST LUMPECTOMY Left 12/09/2020   Procedure: RE-EXCISION OF LEFT BREAST LUMPECTOMY;  Surgeon: Erroll Luna, MD;  Location: [facility];  Service: General;  Laterality: Left;    Current Medications: No outpatient medications have been marked as taking for the 01/07/22 encounter (Appointment) with Donato Heinz, MD.     Allergies:   Sulfa antibiotics   Social History   Socioeconomic History   Marital status: Single    Spouse name: Not on file   Number of children: Not on file   Years of education: Not on file   Highest education level: Not on file  Occupational History   Not on file  Tobacco Use   Smoking status: Never   Smokeless tobacco: Never  Substance and Sexual Activity   Alcohol use: Yes    Comment:  occas   Drug use: Yes    Types: Marijuana    Comment: daily   Sexual activity: Not on file  Other Topics Concern   Not on file  Social History Narrative   Not on file   Social Determinants of Health   Financial Resource Strain: High Risk   Difficulty of Paying Living Expenses: Very hard  Food Insecurity: Food Insecurity Present   Worried About Benjamin Perez in the Last Year: Sometimes true   Ran Out of Food in the Last Year: Sometimes true   Transportation Needs: No Transportation Needs   Lack of Transportation (Medical): No   Lack of Transportation (Non-Medical): No  Physical Activity: Not on file  Stress: Stress Concern Present   Feeling of Stress : Very much  Social Connections: Socially Isolated   Frequency of Communication with Friends and Family: More than three times a week   Frequency of Social Gatherings with Friends and Family: Once a week   Attends Religious Services: Never   Marine scientist or Organizations: No   Attends Music therapist: Never   Marital Status: Never married     Family History: The patient's family history includes Lung cancer in her maternal uncle; Ovarian cancer in her paternal grandmother; Prostate cancer in her paternal grandfather, paternal uncle, and paternal uncle.  ROS:   Please see the history of present illness.     All other systems reviewed and are negative.  EKGs/Labs/Other Studies Reviewed:    The following studies were reviewed today:   EKG:  EKG is  ordered today.  The ekg ordered today demonstrates normal sinus rhythm, rate 68, no ST abnormalities  Recent Labs: 07/07/2021: BNP 17.4 12/17/2021: ALT 14; BUN 9; Creatinine, Ser 0.92; Hemoglobin 11.9; Platelet Count 249; Potassium 3.9; Sodium 140; TSH 0.612  Recent Lipid Panel No results found for: CHOL, TRIG, HDL, CHOLHDL, VLDL, LDLCALC, LDLDIRECT  Physical Exam:    VS:  There were no vitals taken for this visit.    Wt Readings from Last 3 Encounters:  12/17/21 186 lb 8 oz (84.6 kg)  11/25/21 185 lb 8 oz (84.1 kg)  11/05/21 182 lb 11.2 oz (82.9 kg)     GEN:  Well nourished, well developed in no acute distress HEENT: Normal NECK: No JVD; No carotid bruits LYMPHATICS: No lymphadenopathy CARDIAC: RRR, no murmurs, rubs, gallops RESPIRATORY:  Clear to auscultation without rales, wheezing or rhonchi  ABDOMEN: Soft, non-tender, non-distended MUSCULOSKELETAL:  No edema; No deformity  SKIN: Warm and  dry NEUROLOGIC:  Alert and oriented x 3 PSYCHIATRIC:  Normal affect   ASSESSMENT:    No diagnosis found.  PLAN:    Dyspnea/edema: Reports issues with cough/dyspnea and occasional ankle swelling.  BNP normal 06/2021.  Echocardiogram 07/28/2021 showed normal biventricular function, no significant valvular disease.  Has been seen by pulmonology, suspect asthma versus sarcoid.  RTC in***   Medication Adjustments/Labs and Tests Ordered: Current medicines are reviewed at length with the patient today.  Concerns regarding medicines are outlined above.  No orders of the defined types were placed in this encounter.  No orders of the defined types were placed in this encounter.    There are no Patient Instructions on file for this visit.   Signed, Donato Heinz, MD  01/04/2022 8:30 PM    [facility]

## 2022-01-05 NOTE — Progress Notes (Signed)
[facility]  [phone] Fax:(336) [phone]    ID: Jeanne Evans DOB: 1986/09/22  MR#: [id number]  CSN#:[id number]  Patient Care Team: [name], MD as PCP - General (Obstetrics and Gynecology) Rockwell Germany, RN as Oncology Nurse Navigator Mauro Kaufmann, RN as Oncology Nurse Navigator Erroll Luna, MD as Consulting Physician (General Surgery) Magrinat, Virgie Dad, MD (Inactive) as Consulting Physician (Oncology) Kyung Rudd, MD as Consulting Physician (Radiation Oncology) Servando Salina, MD as Consulting Physician (Obstetrics and Gynecology) Brand Males, MD as Consulting Physician (Pulmonary Disease) Claudia Desanctis Steffanie Dunn, MD as Consulting Physician (Plastic Surgery) Benay Pike, MD OTHER MD:  CHIEF COMPLAINT: 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  She is here by herself. She says at night, she has been coughing at night, she coughs so much that she will end up vomiting and that's the only way she can sleep. She was wondering if this is because she stopped taking singulair. She is exhausted she says. She noticed that this cough started after her last immunotherapy No cough in the mornings. No change in bowel habits No change in urinary habits.  We are monitoring her TSH:  Latest Reference Range & Units 07/07/21 11:55 08/27/21 10:09 09/17/21 13:20 10/14/21 13:55  TSH 0.308 - 3.960 uIU/mL 1.040 0.638 0.403 0.707   REVIEW OF SYSTEMS: Jeanne Evans has some pain in the abdomen where she had some fat harvested and of course some soreness in the reconstructed chest.  She had some bad dreams with narcotics so she is using Aleve plus Tylenol and that is working well for her.  She is very pleased with the results and says she "cannot believe they look this good".  She is limited as  to what she can do in terms of upper body exercises but of course she can walk and she is taking short walks at present when the weather permits.  She is tolerating the pembrolizumab with no obvious complications and currently the asthma is not active.  A detailed review of systems was otherwise stable.  COVID 19 VACCINATION STATUS: [place] x2, most recently 01/2021   HISTORY OF CURRENT ILLNESS: From the original intake note:  Jeanne Evans herself palpated a painful left breast lump. She underwent bilateral diagnostic mammography with tomography and left breast ultrasonography at [facility] on 05/12/2020 showing: breast density category B; palpable 2.7 cm oval hypoechoic mass in left breast at 2-3 o'clock; 2.2 cm left axillary lymph node/grouping of nodes.  Accordingly on 05/29/2020 she proceeded to biopsy of the left breast area in question. The pathology from this procedure ([id number]) showed: invasive ductal carcinoma, grade 3. Prognostic indicators significant for: estrogen receptor, 10% positive with weak staining intensity and progesterone receptor, 0% negative. Proliferation marker Ki67 at 95%. HER2 equivocal by immunohistochemistry (2+), but negative by fluorescent in situ hybridization with a signals ratio 1.77 and number per cell 2.65.  Biopsy of the left axillary lymph node in question was negative and concordant  The patient's subsequent history is as detailed below.   PAST MEDICAL HISTORY: Past Medical History:  Diagnosis Date   Asthma 07/31/2021   Breast cancer ([place])    Family history of ovarian cancer    Family history of prostate cancer    GERD (gastroesophageal reflux disease)    History of asthma    has albuterol, uses PRN   History  of heart murmur in childhood    History of multiple allergies   History of allergy related headaches   PAST SURGICAL HISTORY: Past Surgical History:  Procedure Laterality Date   BREAST LUMPECTOMY WITH RADIOACTIVE SEED AND  SENTINEL LYMPH NODE BIOPSY Left 11/19/2020   Procedure: LEFT BREAST LUMPECTOMY X 2 WITH RADIOACTIVE SEED AND SENTINEL LYMPH NODE MAPPING;  Surgeon: Erroll Luna, MD;  Location: [facility] [place];  Service: General;  Laterality: Left;  PECTORAL BLOCK   BREAST REDUCTION SURGERY Right 10/27/2021   Procedure: MAMMARY REDUCTION  TO RIGHT BREAST;  Surgeon: [name], MD;  Location: [facility];  Service: Plastics;  Laterality: Right;   LIPOSUCTION WITH LIPOFILLING Bilateral 10/27/2021   Procedure: BILATERAL FAT GRAFTING TO BREAST;  Surgeon: [name], MD;  Location: [facility] [place];  Service: Plastics;  Laterality: Bilateral;   PORTACATH PLACEMENT Right 06/18/2020   Procedure: INSERTION PORT-A-CATH WITH ULTRASOUND GUIDANCE;  Surgeon: Erroll Luna, MD;  Location: [facility];  Service: General;  Laterality: Right;   RE-EXCISION OF BREAST LUMPECTOMY Left 12/09/2020   Procedure: RE-EXCISION OF LEFT BREAST LUMPECTOMY;  Surgeon: Erroll Luna, MD;  Location: [facility];  Service: General;  Laterality: Left;  She reports having a boil lanced from her perineal area   FAMILY HISTORY: Family History  Problem Relation Age of Onset   Lung cancer Maternal Uncle        dx late 40s   Prostate cancer Paternal Uncle        dx late 42s   Ovarian cancer Paternal Grandmother        dx 41s   Prostate cancer Paternal Grandfather        dx 11s   Prostate cancer Paternal Uncle        dx early 14s  The patient's father is 3 and her mother 21, as of 05/2020.  The patient has two brothers and one sister. She reports ovarian cancer in her paternal grandmother, prostate cancer in her paternal grandfather and a paternal uncle, and lung cancer in a maternal uncle.   GYNECOLOGIC HISTORY:  No LMP recorded. (Menstrual status: IUD). Menarche: 36 years old Age at first live birth: 36 years old [place] P 2 LMP current, experiences  spotting Contraceptive: Mirena IUD in place HRT n/a  Hysterectomy? no BSO? 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 She describes herself as single. She lives at home with her children, Jeanne Evans (age 60) and Jeanne Evans Evans (age 24).  She is originally from [place] her home.    ADVANCED DIRECTIVES: Not in place. She intends to name her mother, Bianney Rockwood, as her HCPOA.  [name] lives in [place] and can be reached at [phone].  The patient was given the appropriate documents to complete and notarized at her discretion at the time of her visit 06/04/2020   HEALTH MAINTENANCE: Social History   Tobacco Use   Smoking status: Never   Smokeless tobacco: Never  Substance Use Topics   Alcohol use: Yes    Comment: occas   Drug use: Yes    Types: Marijuana    Comment: daily     Colonoscopy: n/a (age)  PAP: 04/2020  Bone density: n/a (age)   Allergies  Allergen Reactions   Sulfa Antibiotics Hives    Current Outpatient Medications  Medication Sig Dispense Refill   Acetaminophen (TYLENOL) 325  MG CAPS Take by mouth.     albuterol (VENTOLIN HFA) 108 (90 Base) MCG/ACT inhaler Inhale 2 puffs into the lungs every 6 (six) hours as needed for wheezing or shortness of breath. 8 g 6   fluticasone furoate-vilanterol (BREO ELLIPTA) 100-25 MCG/INH AEPB Inhale 1 puff into the lungs daily. 60 each 5   gabapentin (NEURONTIN) 300 MG capsule Take 1 capsule (300 mg total) by mouth at bedtime. 90 capsule 4   ibuprofen (ADVIL) 800 MG tablet Take 1 tablet (800 mg total) by mouth every 8 (eight) hours as needed. 30 tablet 0   montelukast (SINGULAIR) 10 MG tablet Take 1 tablet (10 mg total) by mouth at bedtime. 30 tablet 11   omeprazole (PRILOSEC) 40 MG capsule TAKE 1 CAPSULE(40 MG) BY MOUTH AT BEDTIME 30 capsule 3   ondansetron (ZOFRAN-ODT) 4 MG disintegrating tablet Take  1 tablet (4 mg total) by mouth every 8 (eight) hours as needed for nausea or vomiting. 20 tablet 0   valACYclovir (VALTREX) 500 MG tablet Take 500 mg by mouth 2 (two) times daily.     venlafaxine XR (EFFEXOR-XR) 75 MG 24 hr capsule Take 1 capsule (75 mg total) by mouth daily with breakfast. 90 capsule 4   No current facility-administered medications for this visit.    OBJECTIVE: African-American woman who appears stated age There were no vitals filed for this visit.      There is no height or weight on file to calculate BMI.   Wt Readings from Last 3 Encounters:  12/17/21 186 lb 8 oz (84.6 kg)  11/25/21 185 lb 8 oz (84.1 kg)  11/05/21 182 lb 11.2 oz (82.9 kg)  ECOG FS:1 - Symptomatic but completely ambulatory  Sclerae unicteric, EOMs intact Wearing a mask No cervical or supraclavicular adenopathy Lungs no rales or rhonchi, coughs on deep inspiration Heart regular rate and rhythm Abd soft, nontender, positive bowel sounds MSK no focal spinal tenderness, no upper extremity lymphedema Neuro: nonfocal, well oriented, appropriate affect Breasts: Deferred   LAB RESULTS:  CMP     Component Value Date/Time   NA 140 12/17/2021 1122   K 3.9 12/17/2021 1122   CL 106 12/17/2021 1122   CO2 29 12/17/2021 1122   GLUCOSE 96 12/17/2021 1122   BUN 9 12/17/2021 1122   CREATININE 0.92 12/17/2021 1122   CREATININE 1.03 (H) 10/14/2021 1356   CALCIUM 9.1 12/17/2021 1122   PROT 7.3 12/17/2021 1122   ALBUMIN 4.3 12/17/2021 1122   AST 15 12/17/2021 1122   AST 18 10/14/2021 1356   ALT 14 12/17/2021 1122   ALT 21 10/14/2021 1356   ALKPHOS 88 12/17/2021 1122   BILITOT 0.7 12/17/2021 1122   BILITOT 0.8 10/14/2021 1356   GFRNONAA >60 12/17/2021 1122   GFRNONAA >60 10/14/2021 1356   GFRAA >60 08/05/2020 0955   GFRAA >60 06/04/2020 0830    No results found for: TOTALPROTELP, ALBUMINELP, A1GS, A2GS, BETS, BETA2SER, GAMS, MSPIKE, SPEI  Lab Results  Component Value Date   WBC 4.5 12/17/2021    NEUTROABS 1.8 12/17/2021   HGB 11.9 (L) 12/17/2021   HCT 35.7 (L) 12/17/2021   MCV 86.2 12/17/2021   PLT 249 12/17/2021    No results found for: LABCA2  No components found for: [id number]  No results for input(s): INR in the last 168 hours.  No results found for: LABCA2  No results found for: WUJ811  No results found for: BJY782  No results found for: NFA213  No results found for:  TE7076  No components found for: HGQUANT  No results found for: CEA1 / No results found for: CEA1   No results found for: AFPTUMOR  No results found for: CHROMOGRNA  No results found for: KPAFRELGTCHN, LAMBDASER, KAPLAMBRATIO (kappa/lambda light chains)  No results found for: HGBA, HGBA2QUANT, HGBFQUANT, HGBSQUAN (Hemoglobinopathy evaluation)   No results found for: LDH  No results found for: IRON, TIBC, IRONPCTSAT (Iron and TIBC)  No results found for: FERRITIN  Urinalysis No results found for: COLORURINE, APPEARANCEUR, LABSPEC, PHURINE, GLUCOSEU, HGBUR, BILIRUBINUR, KETONESUR, PROTEINUR, UROBILINOGEN, NITRITE, LEUKOCYTESUR   STUDIES: No results found.   ELIGIBLE FOR AVAILABLE RESEARCH PROTOCOL: no  ASSESSMENT: 36 y.o. [place] woman status post left breast upper outer quadrant biopsy 05/29/2020 for a clinical T2 N0, stage IIB invasive ductal carcinoma, functionally triple negative, with an MIB-1 of 95%.  (1) genetics testing 06/23/2020 through the [facility] Multi[facility]Cancer Panel found no deleterious mutations in AIP, ALK, APC, ATM, AXIN2,BAP1,  BARD1, BLM, BMPR1A, BRCA1, BRCA2, BRIP1, CASR, CDC73, CDH1, CDK4, CDKN1B, CDKN1C, CDKN2A (p14ARF), CDKN2A (p16INK4a), CEBPA, CHEK2, CTNNA1, DICER1, DIS3L2, EGFR (c.2369C>T, p.Thr790Met variant only), EPCAM (Deletion/duplication testing only), FH, FLCN, GATA2, GPC3, GREM1 (Promoter region deletion/duplication testing only), HOXB13 (c.251G>A, p.Gly84Glu), HRAS, KIT, MAX, MEN1, MET, MITF (c.952G>A, p.Glu318Lys variant only), MLH1, MSH2, MSH3,  MSH6, MUTYH, NBN, NF1, NF2, NTHL1, PALB2, PDGFRA, PHOX2B, PMS2, POLD1, POLE, POT1, PRKAR1A, PTCH1, PTEN, RAD50, RAD51C, RAD51D, RB1, RECQL4, RET, RNF43, RUNX1, SDHAF2, SDHA (sequence changes only), SDHB, SDHC, SDHD, SMAD4, SMARCA4, SMARCB1, SMARCE1, STK11, SUFU, TERC, TERT, TMEM127, TP53, TSC1, TSC2, VHL, WRN and WT1.   (2) neoadjuvant chemotherapy consisting of cyclophosphamide and doxorubicin in dose dense fashion x4 started 06/19/2020, completed 08/05/2020, followed by paclitaxel and carboplatin weekly x12 starting 08/26/2020, completing 4 doses 09/23/2020  (a) echocardiogram on 06/13/2020 showed an EF of 60-65%  (b) paclitaxel and carboplatin discontinued after 4 doses because of neuropathy and cytopenias  (3) status post left lumpectomy and sentinel lymph node sampling 11/19/2020 for a residual yp T1c ypN0 invasive ductal carcinoma involving the superior margin  (a) additional surgery 12/09/2020 cleared the margin in question  (b) repeat prognostic panel confirms triple negative disease  (c) status post left mastopexy and right breast reduction on 10/27/2021 with fat grafting bilaterally  (4) adjuvant radiation : 01/22/2021 through 03/10/2021 Site Technique Total Dose (Gy) Dose per Fx (Gy) Completed Fx Beam Energies  Breast, Left: Breast_Lt 3D 50.4/50.4 1.8 28/28 6X  Breast, Left: Breast_Lt_Bst 3D 10/10 2 5/5 6X   (a) received capecitabine sensitization, started 01/26/2021  (5) pembrolizumab/Keytruda began on 04/16/2021, held 05/11/2021 (after 2 doses) with persistent cough, later attributed to asthma (A) pembrolizumab resumed 08/06/2021 (B) PD-L1 combined score is 10  (6) molecular pathology from the 11/19/2020 sample 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  There were no mutations in BRCA1 or 2, ERBB2 or PIK3CA.  The microsatellite status was equivocal and the tumor mutational burden could not be determined on the sample.  (7) sarcoidosis? (followed by pulmonary: Ramaswamy)  (A) chest CT 06/13/2020  shows in addition to the left breast mass multiple ill-defined pulmonary nodules, pleural nodularity and symmetrical by hilar and mediastinal adenopathy consistent with sarcoidosis. (B) chest CT 05/29/2021 shows stable mediastinal and hilar adenopathy as well as improved liver lesions.  No evidence of metastatic disease.  PLAN:  She complains of cough today mostly nocturnal.  She wonders if this could be because she is stopped taking Singulair and if the spring allergies are triggering her cough.  She denies any cough  during the daytime.  No worsening shortness of breath, fevers or chills. No other toxicity reported from immunotherapy. Given no evidence of hypoxia, adventitious sounds on exam and since this could very well be related to her asthma and allergies, we will proceed with immunotherapy today.  We have restarted her back on Singulair per her request.  If she continues to experience this severe cough despite restarting Singulair, she was asked to notify us immediately.  We will order a CT chest to evaluate for pneumonitis again.  She understands that immunotherapy can cause inflammation in the lungs and pneumonitis in a small percentage of patients which can be severe. She also wanted a letter stating that she should be allowed to work about 6 to 8 hours a day, this will be left in her MyChart.  Total time spent: 30 minutes  *Total Encounter Time as defined by the Centers for Medicare and Medicaid Services includes, in addition to the face-to-face time of a patient visit (documented in the note above) non-face-to-face time: obtaining and reviewing outside history, ordering and reviewing medications, tests or procedures, care coordination (communications with other health care professionals or caregivers) and documentation in the medical record.

## 2022-01-06 ENCOUNTER — Inpatient Hospital Stay: Payer: 59

## 2022-01-06 ENCOUNTER — Other Ambulatory Visit: Payer: Self-pay

## 2022-01-06 ENCOUNTER — Encounter: Payer: Self-pay | Admitting: Hematology and Oncology

## 2022-01-06 ENCOUNTER — Inpatient Hospital Stay (HOSPITAL_BASED_OUTPATIENT_CLINIC_OR_DEPARTMENT_OTHER): Payer: 59 | Admitting: Hematology and Oncology

## 2022-01-06 ENCOUNTER — Encounter: Payer: Self-pay | Admitting: *Deleted

## 2022-01-06 VITALS — BP 152/82 | HR 82 | Temp 98.1°F | Resp 16 | Ht 63.0 in | Wt 185.7 lb

## 2022-01-06 DIAGNOSIS — R053 Chronic cough: Secondary | ICD-10-CM | POA: Diagnosis not present

## 2022-01-06 DIAGNOSIS — Z171 Estrogen receptor negative status [ER-]: Secondary | ICD-10-CM

## 2022-01-06 DIAGNOSIS — Z95828 Presence of other vascular implants and grafts: Secondary | ICD-10-CM

## 2022-01-06 DIAGNOSIS — C50412 Malignant neoplasm of upper-outer quadrant of left female breast: Secondary | ICD-10-CM

## 2022-01-06 DIAGNOSIS — Z5112 Encounter for antineoplastic immunotherapy: Secondary | ICD-10-CM | POA: Diagnosis not present

## 2022-01-06 DIAGNOSIS — Z23 Encounter for immunization: Secondary | ICD-10-CM | POA: Diagnosis not present

## 2022-01-06 LAB — CBC WITH DIFFERENTIAL (CANCER CENTER ONLY)
Abs Immature Granulocytes: 0.01 10*3/uL (ref 0.00–0.07)
Basophils Absolute: 0 10*3/uL (ref 0.0–0.1)
Basophils Relative: 1 %
Eosinophils Absolute: 0.5 10*3/uL (ref 0.0–0.5)
Eosinophils Relative: 12 %
HCT: 36 % (ref 36.0–46.0)
Hemoglobin: 12 g/dL (ref 12.0–15.0)
Immature Granulocytes: 0 %
Lymphocytes Relative: 45 %
Lymphs Abs: 2 10*3/uL (ref 0.7–4.0)
MCH: 28.6 pg (ref 26.0–34.0)
MCHC: 33.3 g/dL (ref 30.0–36.0)
MCV: 85.9 fL (ref 80.0–100.0)
Monocytes Absolute: 0.3 10*3/uL (ref 0.1–1.0)
Monocytes Relative: 7 %
Neutro Abs: 1.5 10*3/uL — ABNORMAL LOW (ref 1.7–7.7)
Neutrophils Relative %: 35 %
Platelet Count: 241 10*3/uL (ref 150–400)
RBC: 4.19 MIL/uL (ref 3.87–5.11)
RDW: 14.4 % (ref 11.5–15.5)
WBC Count: 4.4 10*3/uL (ref 4.0–10.5)
nRBC: 0 % (ref 0.0–0.2)

## 2022-01-06 LAB — COMPREHENSIVE METABOLIC PANEL
ALT: 14 U/L (ref 0–44)
AST: 14 U/L — ABNORMAL LOW (ref 15–41)
Albumin: 4.3 g/dL (ref 3.5–5.0)
Alkaline Phosphatase: 87 U/L (ref 38–126)
Anion gap: 7 (ref 5–15)
BUN: 11 mg/dL (ref 6–20)
CO2: 27 mmol/L (ref 22–32)
Calcium: 9 mg/dL (ref 8.9–10.3)
Chloride: 103 mmol/L (ref 98–111)
Creatinine, Ser: 0.88 mg/dL (ref 0.44–1.00)
GFR, Estimated: >60 mL/min (ref >60)
Glucose, Bld: 136 mg/dL — ABNORMAL HIGH (ref 70–99)
Potassium: 3.5 mmol/L (ref 3.5–5.1)
Sodium: 137 mmol/L (ref 135–145)
Total Bilirubin: 0.8 mg/dL (ref 0.3–1.2)
Total Protein: 7.3 g/dL (ref 6.5–8.1)

## 2022-01-06 LAB — TSH: TSH: 0.609 u[IU]/mL (ref 0.308–3.960)

## 2022-01-06 MED ORDER — SODIUM CHLORIDE 0.9% FLUSH
10.0000 mL | INTRAVENOUS | Status: DC | PRN
  Administered 2022-01-06: 10 mL

## 2022-01-06 MED ORDER — SODIUM CHLORIDE 0.9% FLUSH
10.0000 mL | INTRAVENOUS | Status: AC | PRN
  Administered 2022-01-06: 10 mL

## 2022-01-06 MED ORDER — MONTELUKAST SODIUM 10 MG PO TABS: 10.0000 mg | ORAL_TABLET | Freq: Every day | ORAL | 1 refills | Status: DC

## 2022-01-06 MED ORDER — SODIUM CHLORIDE 0.9 % IV SOLN
200.0000 mg | Freq: Once | INTRAVENOUS | Status: AC
  Administered 2022-01-06: 200 mg via INTRAVENOUS
  Filled 2022-01-06: qty 200

## 2022-01-06 MED ORDER — HEPARIN SOD (PORK) LOCK FLUSH 100 UNIT/ML IV SOLN
500.0000 [IU] | Freq: Once | INTRAVENOUS | Status: AC | PRN
  Administered 2022-01-06: 500 [IU]

## 2022-01-06 MED ORDER — SODIUM CHLORIDE 0.9 % IV SOLN: Freq: Once | INTRAVENOUS | Status: AC

## 2022-01-06 NOTE — Patient Instructions (Signed)
[facility]  Discharge Instructions: Thank you for choosing [facility] to provide your oncology and hematology care.   If you have a lab appointment with the [facility], please go directly to the [facility] and check in at the registration area.   Wear comfortable clothing and clothing appropriate for easy access to any Portacath or PICC line.   We strive to give you quality time with your provider. You may need to reschedule your appointment if you arrive late (15 or more minutes).  Arriving late affects you and other patients whose appointments are after yours.  Also, if you miss three or more appointments without notifying the office, you may be dismissed from the clinic at the providers discretion.      For prescription refill requests, have your pharmacy contact our office and allow 72 hours for refills to be completed.    Today you received the following chemotherapy and/or immunotherapy agents: Keytruda      To help prevent nausea and vomiting after your treatment, we encourage you to take your nausea medication as directed.  BELOW ARE SYMPTOMS THAT SHOULD BE REPORTED IMMEDIATELY: *FEVER GREATER THAN 100.4 F (38 C) OR HIGHER *CHILLS OR SWEATING *NAUSEA AND VOMITING THAT IS NOT CONTROLLED WITH YOUR NAUSEA MEDICATION *UNUSUAL SHORTNESS OF BREATH *UNUSUAL BRUISING OR BLEEDING *URINARY PROBLEMS (pain or burning when urinating, or frequent urination) *BOWEL PROBLEMS (unusual diarrhea, constipation, pain near the anus) TENDERNESS IN MOUTH AND THROAT WITH OR WITHOUT PRESENCE OF ULCERS (sore throat, sores in mouth, or a toothache) UNUSUAL RASH, SWELLING OR PAIN  UNUSUAL VAGINAL DISCHARGE OR ITCHING   Items with * indicate a potential emergency and should be followed up as soon as possible or go to the Emergency Department if any problems should occur.  Please show the CHEMOTHERAPY ALERT CARD or IMMUNOTHERAPY ALERT CARD at check-in to  the Emergency Department and triage nurse.  Should you have questions after your visit or need to cancel or reschedule your appointment, please contact [facility]  Dept: [phone]  and follow the prompts.  Office hours are 8:00 a.m. to 4:30 p.m. Monday - Friday. Please note that voicemails left after 4:00 p.m. may not be returned until the following business day.  We are closed weekends and major holidays. You have access to a nurse at all times for urgent questions. Please call the main number to the clinic Dept: [phone] and follow the prompts.   For any non-urgent questions, you may also contact your provider using MyChart. We now offer e-Visits for anyone 41 and older to request care online for non-urgent symptoms. For details visit [email or web address].   Also download the MyChart app! Go to the app store, search "MyChart", open the app, select [facility], and log in with your MyChart username and password.  Due to Covid, a mask is required upon entering the hospital/clinic. If you do not have a mask, one will be given to you upon arrival. For doctor visits, patients may have 1 support person aged 31 or older with them. For treatment visits, patients cannot have anyone with them due to current Covid guidelines and our immunocompromised population.   Influenza (Flu) Vaccine (Inactivated or Recombinant): What You Need to Know 1. Why get vaccinated? Influenza vaccine can prevent influenza (flu). Flu is a contagious disease that spreads around the [place] every year, usually between October and May. Anyone can get the flu, but it is more dangerous  for some people. Infants and young children, people 50 years and older, pregnant people, and people with certain health conditions or a weakened immune system are at greatest risk of flu complications. Pneumonia, bronchitis, sinus infections, and ear infections are examples of flu-related complications. If you  have a medical condition, such as heart disease, cancer, or diabetes, flu can make it worse. Flu can cause fever and chills, sore throat, muscle aches, fatigue, cough, headache, and runny or stuffy nose. Some people may have vomiting and diarrhea, though this is more common in children than adults. In an average year, thousands of people in the [place] die from flu, and many more are hospitalized. Flu vaccine prevents millions of illnesses and flu-related visits to the doctor each year. 2. Influenza vaccines CDC recommends everyone 6 months and older get vaccinated every flu season. Children 6 months through 39 years of age may need 2 doses during a single flu season. Everyone else needs only 1 dose each flu season. It takes about 2 weeks for protection to develop after vaccination. There are many flu viruses, and they are always changing. Each year a new flu vaccine is made to protect against the influenza viruses believed to be likely to cause disease in the upcoming flu season. Even when the vaccine doesn't exactly match these viruses, it may still provide some protection. Influenza vaccine does not cause flu. Influenza vaccine may be given at the same time as other vaccines. 3. Talk with your health care provider Tell your vaccination provider if the person getting the vaccine: Has had an allergic reaction after a previous dose of influenza vaccine, or has any severe, life-threatening allergies Has ever had Guillain-Barr Syndrome (also called "GBS") In some cases, your health care provider may decide to postpone influenza vaccination until a future visit. Influenza vaccine can be administered at any time during pregnancy. People who are or will be pregnant during influenza season should receive inactivated influenza vaccine. People with minor illnesses, such as a cold, may be vaccinated. People who are moderately or severely ill should usually wait until they recover before getting influenza  vaccine. Your health care provider can give you more information. 4. Risks of a vaccine reaction Soreness, redness, and swelling where the shot is given, fever, muscle aches, and headache can happen after influenza vaccination. There may be a very small increased risk of Guillain-Barr Syndrome (GBS) after inactivated influenza vaccine (the flu shot). Young children who get the flu shot along with pneumococcal vaccine (PCV13) and/or DTaP vaccine at the same time might be slightly more likely to have a seizure caused by fever. Tell your health care provider if a child who is getting flu vaccine has ever had a seizure. People sometimes faint after medical procedures, including vaccination. Tell your provider if you feel dizzy or have vision changes or ringing in the ears. As with any medicine, there is a very remote chance of a vaccine causing a severe allergic reaction, other serious injury, or death. 5. What if there is a serious problem? An allergic reaction could occur after the vaccinated person leaves the clinic. If you see signs of a severe allergic reaction (hives, swelling of the face and throat, difficulty breathing, a fast heartbeat, dizziness, or weakness), call 9-1-1 and get the person to the nearest hospital. For other signs that concern you, call your health care provider. Adverse reactions should be reported to the Vaccine Adverse Event Reporting System (VAERS). Your health care provider will usually file this  report, or you can do it yourself. Visit the VAERS website at www.vaers.SamedayNews.es or call 661-851-8141. VAERS is only for reporting reactions, and VAERS staff members do not give medical advice. 6. The National Vaccine Injury Compensation Program The [employer] Vaccine Injury Compensation Program (VICP) is a federal program that was created to compensate people who may have been injured by certain vaccines. Claims regarding alleged injury or death due to vaccination have a time limit  for filing, which may be as short as two years. Visit the VICP website at GoldCloset.com.ee or call (506)716-5983 to learn about the program and about filing a claim. 7. How can I learn more? Ask your health care provider. Call your local or state health department. Visit the website of the Food and Drug Administration (FDA) for vaccine package inserts and additional information at TraderRating.uy. Contact the Centers for Disease Control and Prevention (CDC): Call 442-055-9385 (1-800-CDC-INFO) or Visit CDC's website at https://gibson.com/. Vaccine Information Statement Inactivated Influenza Vaccine (06/20/2020) This information is not intended to replace advice given to you by your health care provider. Make sure you discuss any questions you have with your health care provider. Document Revised: 07/23/2021 Document Reviewed: 07/23/2021 Elsevier Patient Education  2022 [employer].

## 2022-01-07 ENCOUNTER — Ambulatory Visit: Payer: 59 | Admitting: Cardiology

## 2022-01-15 ENCOUNTER — Ambulatory Visit (INDEPENDENT_AMBULATORY_CARE_PROVIDER_SITE_OTHER): Payer: 59 | Admitting: Surgical

## 2022-01-15 ENCOUNTER — Other Ambulatory Visit: Payer: Self-pay

## 2022-01-15 DIAGNOSIS — C50412 Malignant neoplasm of upper-outer quadrant of left female breast: Secondary | ICD-10-CM

## 2022-01-15 DIAGNOSIS — Z171 Estrogen receptor negative status [ER-]: Secondary | ICD-10-CM

## 2022-01-15 NOTE — Progress Notes (Signed)
36 year old female here for follow-up after right breast reduction and fat grafting to bilateral breasts with Dr. Claudia Desanctis on 10/27/2021.  Prior to this she did undergo left-sided breast conservation surgery via left breast lumpectomy.  She did have radiation to the left breast. ? ?She reports that overall she is doing well after surgery on 10/27/2021.  She reports that she has noticed some itching of her right lateral breast incision.  She is overall very pleased with how things are healing otherwise.  She feels symmetric in her clothes and when wearing a bra. ? ?She reports that she has a small suture protruding through the skin on the right breast that is causing some tenderness. ? ?Chaperone present on exam ?On exam bilateral NAC's are viable, bilateral breast incisions are intact and well-healed.  She does have a small suture protruding through the T-junction on the right breast.  There is no surrounding erythema.  The right lateral breast incisions are slightly hypertrophic, they do not appear to be keloid it. ? ?Small suture protruding through the T-junction on the right breast was removed, patient tolerated this well.  Recommend Vaseline and gauze for the next few days to keep this area covered. ?Recommend following up as needed.  We discussed silicone scar patches for the right lateral thickened breast incision.  We discussed that if it continues to worsen or does not improve, we could consider steroid injections.  Recommend calling to schedule appointment if she does not notice much improvement and would like to discuss injections. ? ?Pictures were obtained of the patient and placed in the chart with the patient's or guardian's permission. ? ?

## 2022-01-22 ENCOUNTER — Encounter: Payer: Self-pay | Admitting: *Deleted

## 2022-01-28 ENCOUNTER — Inpatient Hospital Stay: Payer: 59 | Attending: Oncology

## 2022-01-28 ENCOUNTER — Other Ambulatory Visit: Payer: Self-pay

## 2022-01-28 ENCOUNTER — Other Ambulatory Visit: Payer: 59

## 2022-01-28 ENCOUNTER — Encounter: Payer: Self-pay | Admitting: Hematology and Oncology

## 2022-01-28 ENCOUNTER — Inpatient Hospital Stay: Payer: 59

## 2022-01-28 ENCOUNTER — Inpatient Hospital Stay (HOSPITAL_BASED_OUTPATIENT_CLINIC_OR_DEPARTMENT_OTHER): Payer: 59 | Admitting: Hematology and Oncology

## 2022-01-28 VITALS — BP 137/93 | HR 76 | Temp 97.9°F | Resp 16 | Ht 63.0 in | Wt 181.3 lb

## 2022-01-28 DIAGNOSIS — R053 Chronic cough: Secondary | ICD-10-CM | POA: Diagnosis not present

## 2022-01-28 DIAGNOSIS — Z23 Encounter for immunization: Secondary | ICD-10-CM

## 2022-01-28 DIAGNOSIS — Z8041 Family history of malignant neoplasm of ovary: Secondary | ICD-10-CM | POA: Insufficient documentation

## 2022-01-28 DIAGNOSIS — C50412 Malignant neoplasm of upper-outer quadrant of left female breast: Secondary | ICD-10-CM | POA: Diagnosis not present

## 2022-01-28 DIAGNOSIS — Z171 Estrogen receptor negative status [ER-]: Secondary | ICD-10-CM

## 2022-01-28 DIAGNOSIS — Z5112 Encounter for antineoplastic immunotherapy: Secondary | ICD-10-CM | POA: Diagnosis not present

## 2022-01-28 DIAGNOSIS — Z79899 Other long term (current) drug therapy: Secondary | ICD-10-CM | POA: Diagnosis not present

## 2022-01-28 DIAGNOSIS — Z801 Family history of malignant neoplasm of trachea, bronchus and lung: Secondary | ICD-10-CM | POA: Insufficient documentation

## 2022-01-28 DIAGNOSIS — Z8042 Family history of malignant neoplasm of prostate: Secondary | ICD-10-CM | POA: Insufficient documentation

## 2022-01-28 LAB — CBC WITH DIFFERENTIAL (CANCER CENTER ONLY)
Abs Immature Granulocytes: 0 10*3/uL (ref 0.00–0.07)
Basophils Absolute: 0 10*3/uL (ref 0.0–0.1)
Basophils Relative: 1 %
Eosinophils Absolute: 0.4 10*3/uL (ref 0.0–0.5)
Eosinophils Relative: 10 %
HCT: 35.8 % — ABNORMAL LOW (ref 36.0–46.0)
Hemoglobin: 12.2 g/dL (ref 12.0–15.0)
Immature Granulocytes: 0 %
Lymphocytes Relative: 42 %
Lymphs Abs: 1.7 10*3/uL (ref 0.7–4.0)
MCH: 28.8 pg (ref 26.0–34.0)
MCHC: 34.1 g/dL (ref 30.0–36.0)
MCV: 84.6 fL (ref 80.0–100.0)
Monocytes Absolute: 0.3 10*3/uL (ref 0.1–1.0)
Monocytes Relative: 8 %
Neutro Abs: 1.5 10*3/uL — ABNORMAL LOW (ref 1.7–7.7)
Neutrophils Relative %: 39 %
Platelet Count: 239 10*3/uL (ref 150–400)
RBC: 4.23 MIL/uL (ref 3.87–5.11)
RDW: 14.3 % (ref 11.5–15.5)
WBC Count: 3.9 10*3/uL — ABNORMAL LOW (ref 4.0–10.5)
nRBC: 0 % (ref 0.0–0.2)

## 2022-01-28 LAB — COMPREHENSIVE METABOLIC PANEL
ALT: 11 U/L (ref 0–44)
AST: 12 U/L — ABNORMAL LOW (ref 15–41)
Albumin: 4.2 g/dL (ref 3.5–5.0)
Alkaline Phosphatase: 82 U/L (ref 38–126)
Anion gap: 5 (ref 5–15)
BUN: 11 mg/dL (ref 6–20)
CO2: 28 mmol/L (ref 22–32)
Calcium: 9.2 mg/dL (ref 8.9–10.3)
Chloride: 104 mmol/L (ref 98–111)
Creatinine, Ser: 0.9 mg/dL (ref 0.44–1.00)
GFR, Estimated: >60 mL/min (ref >60)
Glucose, Bld: 131 mg/dL — ABNORMAL HIGH (ref 70–99)
Potassium: 3.8 mmol/L (ref 3.5–5.1)
Sodium: 137 mmol/L (ref 135–145)
Total Bilirubin: 0.6 mg/dL (ref 0.3–1.2)
Total Protein: 7.3 g/dL (ref 6.5–8.1)

## 2022-01-28 LAB — TSH: TSH: 0.481 u[IU]/mL (ref 0.308–3.960)

## 2022-01-28 MED ORDER — HEPARIN SOD (PORK) LOCK FLUSH 100 UNIT/ML IV SOLN
500.0000 [IU] | Freq: Once | INTRAVENOUS | Status: AC | PRN
  Administered 2022-01-28: 500 [IU]

## 2022-01-28 MED ORDER — SODIUM CHLORIDE 0.9 % IV SOLN
200.0000 mg | Freq: Once | INTRAVENOUS | Status: AC
  Administered 2022-01-28: 200 mg via INTRAVENOUS
  Filled 2022-01-28: qty 200

## 2022-01-28 MED ORDER — SODIUM CHLORIDE 0.9 % IV SOLN: Freq: Once | INTRAVENOUS | Status: AC

## 2022-01-28 MED ORDER — SODIUM CHLORIDE 0.9% FLUSH
10.0000 mL | INTRAVENOUS | Status: DC | PRN
  Administered 2022-01-28: 10 mL

## 2022-01-28 MED ORDER — SODIUM CHLORIDE 0.9% FLUSH
10.0000 mL | INTRAVENOUS | Status: DC | PRN
  Administered 2022-01-28: 10 mL via INTRAVENOUS

## 2022-01-28 NOTE — Patient Instructions (Signed)
[facility]  Discharge Instructions: Thank you for choosing [facility] to provide your oncology and hematology care.   If you have a lab appointment with the Cancer Center, please go directly to the Cancer Center and check in at the registration area.   Wear comfortable clothing and clothing appropriate for easy access to any Portacath or PICC line.   We strive to give you quality time with your provider. You may need to reschedule your appointment if you arrive late (15 or more minutes).  Arriving late affects you and other patients whose appointments are after yours.  Also, if you miss three or more appointments without notifying the office, you may be dismissed from the clinic at the provider's discretion.      For prescription refill requests, have your pharmacy contact our office and allow 72 hours for refills to be completed.    Today you received the following chemotherapy and/or immunotherapy agents: keytruda      To help prevent nausea and vomiting after your treatment, we encourage you to take your nausea medication as directed.  BELOW ARE SYMPTOMS THAT SHOULD BE REPORTED IMMEDIATELY: *FEVER GREATER THAN 100.4 F (38 C) OR HIGHER *CHILLS OR SWEATING *NAUSEA AND VOMITING THAT IS NOT CONTROLLED WITH YOUR NAUSEA MEDICATION *UNUSUAL SHORTNESS OF BREATH *UNUSUAL BRUISING OR BLEEDING *URINARY PROBLEMS (pain or burning when urinating, or frequent urination) *BOWEL PROBLEMS (unusual diarrhea, constipation, pain near the anus) TENDERNESS IN MOUTH AND THROAT WITH OR WITHOUT PRESENCE OF ULCERS (sore throat, sores in mouth, or a toothache) UNUSUAL RASH, SWELLING OR PAIN  UNUSUAL VAGINAL DISCHARGE OR ITCHING   Items with * indicate a potential emergency and should be followed up as soon as possible or go to the Emergency Department if any problems should occur.  Please show the CHEMOTHERAPY ALERT CARD or IMMUNOTHERAPY ALERT CARD at check-in to  the Emergency Department and triage nurse.  Should you have questions after your visit or need to cancel or reschedule your appointment, please contact [facility]  Dept: [phone]  and follow the prompts.  Office hours are 8:00 a.m. to 4:30 p.m. Monday - Friday. Please note that voicemails left after 4:00 p.m. may not be returned until the following business day.  We are closed weekends and major holidays. You have access to a nurse at all times for urgent questions. Please call the main number to the clinic Dept: [phone] and follow the prompts.   For any non-urgent questions, you may also contact your provider using MyChart. We now offer e-Visits for anyone 18 and older to request care online for non-urgent symptoms. For details visit [email or web address].   Also download the MyChart app! Go to the app store, search "MyChart", open the app, select [facility], and log in with your MyChart username and password.  Due to Covid, a mask is required upon entering the hospital/clinic. If you do not have a mask, one will be given to you upon arrival. For doctor visits, patients may have 1 support person aged 18 or older with them. For treatment visits, patients cannot have anyone with them due to current Covid guidelines and our immunocompromised population.   

## 2022-01-28 NOTE — Progress Notes (Signed)
?[facility]  ?[phone] [phone]  ? ? ?ID: Jeanne Evans DOB: 1986/10/15  MR#: [id number]  ZHY#:[id number] ? ?Patient Care Team: ?Jeanne Kava, MD as PCP - General (Obstetrics and Gynecology) ?Jeanne Germany, RN as Oncology Nurse Navigator ?Jeanne Kaufmann, RN as Oncology Nurse Navigator ?Jeanne Luna, MD as Consulting Physician (General Surgery) ?Magrinat, Virgie Dad, MD (Inactive) as Consulting Physician (Oncology) ?Jeanne Rudd, MD as Consulting Physician (Radiation Oncology) ?Jeanne Salina, MD as Consulting Physician (Obstetrics and Gynecology) ?Jeanne Males, MD as Consulting Physician (Pulmonary Disease) ?Jeanne Presume, MD as Consulting Physician (Plastic Surgery) ?Jeanne Pike, MD ?OTHER MD: ? 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 ?CURRENT TREATMENT: pembrolizumab ? ? ?INTERVAL HISTORY: ? ?[facility] for follow up and treatment of her 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  ?She is on adjuvant immunotherapy started by Dr. Jana Evans back in June 2022 however treatment was held after 2 doses because of a persistent cough which was later thought to be related to asthma and sarcoidosis.  She is now back on immunotherapy.  September 2022.  Since last visit, no major changes in her cough.  She tried Singulair which was working well for her hence started taking double the doses of Singulair every night which she said was letting her sleep throughout the night without any cough.  She was also taking Prilosec every night. ?She also noticed some arthralgias especially in the left but wonders if this is related to her work.  No other adverse effects reported.  No change in bowel habits or urinary habits.  Labs from [facility] reviewed, satisfactory to proceed ? ? ?REVIEW OF SYSTEMS: ?A detailed review of systems was otherwise stable. ? ?COVID 19 VACCINATION STATUS: [facility]ne Evans x2, most recently 01/2021 ? ? ?HISTORY OF CURRENT  ILLNESS: ?From the original intake note: ? ?Jeanne Evans herself palpated a painful left breast lump. She underwent bilateral diagnostic mammography with tomography and left breast ultrasonography at [facility] on 05/12/2020 showing: breast density category B; palpable 2.7 cm oval hypoechoic mass in left breast at 2-3 o'clock; 2.2 cm left axillary lymph node/grouping of nodes. ? ?Accordingly on 05/29/2020 she proceeded to biopsy of the left breast area in question. The pathology from this procedure ([id number]) showed: invasive ductal carcinoma, grade 3. Prognostic indicators significant for: estrogen receptor, 10% positive with weak staining intensity and progesterone receptor, 0% negative. Proliferation marker Ki67 at 95%. HER2 equivocal by immunohistochemistry (2+), but negative by fluorescent in situ hybridization with a signals ratio 1.77 and number per cell 2.65. ? ?Biopsy of the left axillary lymph node in question was negative and concordant ? ?The patient's subsequent history is as detailed below. ? ? ?PAST MEDICAL HISTORY: ?Past Medical History:  ?Diagnosis Date  ? Asthma 07/31/2021  ? Breast cancer ([place])   ? Family history of ovarian cancer   ? Family history of prostate cancer   ? GERD (gastroesophageal reflux disease)   ? History of asthma   ? has albuterol, uses PRN  ? History of heart murmur in childhood   ? History of multiple allergies   ?History of allergy related headaches ? ? ?PAST SURGICAL HISTORY: ?Past Surgical History:  ?Procedure Laterality Date  ? BREAST LUMPECTOMY WITH RADIOACTIVE SEED AND SENTINEL LYMPH NODE BIOPSY Left 11/19/2020  ? Procedure: LEFT BREAST LUMPECTOMY X 2 WITH RADIOACTIVE SEED AND SENTINEL LYMPH NODE MAPPING;  Surgeon: Jeanne Luna, MD;  Location: [facility];  Service: General;  Laterality: Left;  PECTORAL BLOCK  ? BREAST REDUCTION SURGERY Right 10/27/2021  ? Procedure: MAMMARY REDUCTION  TO RIGHT BREAST;  Surgeon: Jeanne Presume, MD;   Location: [facility];  Service: Plastics;  Laterality: Right;  ? LIPOSUCTION WITH LIPOFILLING Bilateral 10/27/2021  ? Procedure: BILATERAL FAT GRAFTING TO BREAST;  Surgeon: Jeanne Presume, MD;  Location: [facility];  Service: Plastics;  Laterality: Bilateral;  ? PORTACATH PLACEMENT Right 06/18/2020  ? Procedure: INSERTION PORT-A-CATH WITH ULTRASOUND GUIDANCE;  Surgeon: Jeanne Luna, MD;  Location: [facility];  Service: General;  Laterality: Right;  ? RE-EXCISION OF BREAST LUMPECTOMY Left 12/09/2020  ? Procedure: RE-EXCISION OF LEFT BREAST LUMPECTOMY;  Surgeon: Jeanne Luna, MD;  Location: [facility];  Service: General;  Laterality: Left;  ?She reports having a boil lanced from her perineal area ? ? ?FAMILY HISTORY: ?Family History  ?Problem Relation Age of Onset  ? Lung cancer Maternal Uncle   ?     dx late 77s  ? Prostate cancer Paternal Uncle   ?     dx late 71s  ? Ovarian cancer Paternal Grandmother   ?     dx 47s  ? Prostate cancer Paternal Grandfather   ?     dx 43s  ? Prostate cancer Paternal Uncle   ?     dx early 20s  ?The patient's father is 94 and her mother 60, as of 05/2020.  The patient has two brothers and one sister. She reports ovarian cancer in her paternal grandmother, prostate cancer in her paternal grandfather and a paternal uncle, and lung cancer in a maternal uncle. ? ? ?GYNECOLOGIC HISTORY:  ?No LMP recorded. (Menstrual status: IUD). ?Menarche: 36 years old ?Age at first live birth: 36 years old ?GX P 2 ?LMP current, experiences spotting ?Contraceptive: Mirena IUD in place ?HRT n/a  ?Hysterectomy? no ?BSO? no ? ? 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 She describes herself as single. She lives at home with her children, Jeanne Evans (age 57) and Jeanne Evans (age 67).  She is originally from [place] her  home. ?  ? ADVANCED DIRECTIVES: Not in place. She intends to name her mother, Jeanne Evans, as her HCPOA.  Jeanne Evans lives in [place] and can be reached at [phone].  The patient was given the appropriate documents to complete and notarized at her discretion at the time of her visit 06/04/2020 ? ? ?HEALTH MAINTENANCE: ?Social History  ? ?Tobacco Use  ? Smoking status: Never  ? Smokeless tobacco: Never  ?Substance Use Topics  ? Alcohol use: Yes  ?  Comment: occas  ? Drug use: Yes  ?  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 ? ? Colonoscopy: n/a (age) ? PAP: 04/2020 ? Bone density: n/a (age) ?  ?Allergies  ?Allergen Reactions  ? Sulfa Antibiotics Hives  ? ? ?Current Outpatient Medications  ?Medication Sig Dispense Refill  ? Acetaminophen (TYLENOL) 325 MG CAPS Take by mouth.    ? albuterol (VENTOLIN HFA) 108 (90 Base) MCG/ACT inhaler Inhale 2 puffs into the lungs every 6 (six) hours as needed for wheezing or shortness of breath. 8 g 6  ? fluticasone furoate-vilanterol (BREO ELLIPTA) 100-25 MCG/INH AEPB Inhale 1 puff into the lungs daily. 60 each 5  ? gabapentin (NEURONTIN) 300 MG capsule Take 1 capsule (300 mg total) by mouth at bedtime. 90 capsule 4  ?  ibuprofen (ADVIL) 800 MG tablet Take 1 tablet (800 mg total) by mouth every 8 (eight) hours as needed. 30 tablet 0  ? montelukast (SINGULAIR) 10 MG tablet Take 1 tablet (10 mg total) by mouth at bedtime. 30 tablet 1  ? omeprazole (PRILOSEC) 40 MG capsule TAKE 1 CAPSULE(40 MG) BY MOUTH AT BEDTIME 30 capsule 3  ? ondansetron (ZOFRAN-ODT) 4 MG disintegrating tablet Take 1 tablet (4 mg total) by mouth every 8 (eight) hours as needed for nausea or vomiting. 20 tablet 0  ? valACYclovir (VALTREX) 500 MG tablet Take 500 mg by mouth 2 (two) times daily.    ? venlafaxine XR (EFFEXOR-XR) 75 MG 24 hr capsule Take 1 capsule (75 mg total) by mouth daily with breakfast. 90 capsule 4  ? ?No current facility-administered medications for this visit.  ? ? ?OBJECTIVE: African-American  woman who appears stated age ?Vitals:  ? 01/28/22 0945  ?BP: (!) 137/93  ?Pulse: 76  ?Resp: 16  ?Temp: 97.9 ?F (36.6 ?C)  ?SpO2: 100%  ? ? ? ?   Body mass index is 32.12 kg/m?.   ?Wt Readings from Last 3 Encounters:

## 2022-01-29 ENCOUNTER — Telehealth: Payer: Self-pay | Admitting: Hematology and Oncology

## 2022-01-29 NOTE — Telephone Encounter (Signed)
Scheduled appointment per 03/16 los. Patient aware of changes.  ?

## 2022-02-01 ENCOUNTER — Telehealth: Payer: Self-pay | Admitting: *Deleted

## 2022-02-01 NOTE — Telephone Encounter (Signed)
This RN spoke with pt per her call requesting a referral to an ENT due to unresolved coughing. She is scheduled for follow up with her pulmonary in April " but I do not think this is asthma because the inhalers are not helping- the only thing that seems to help is the Singular but only for approximately 12 hours- and I can only use it 1 time a day." ? ?Note cough is dry-non productive. Talking exacerbates cough which can become deep and cause gagging with vomiting. ? ?She states the cough is interfering with her intake. ? ?She is wondering if the cough could be more related to her tonsils or structure issues. ? ?This RN informed her referral would be made per above. ?

## 2022-02-02 ENCOUNTER — Observation Stay (HOSPITAL_COMMUNITY)
Admission: EM | Admit: 2022-02-02 | Discharge: 2022-02-06 | Disposition: A | Discharge status: Home / Self Care | Payer: 59 | Attending: Internal Medicine | Admitting: Internal Medicine

## 2022-02-02 ENCOUNTER — Emergency Department (HOSPITAL_COMMUNITY): Payer: 59

## 2022-02-02 ENCOUNTER — Encounter: Payer: Self-pay | Admitting: Hematology and Oncology

## 2022-02-02 ENCOUNTER — Ambulatory Visit
Admission: EM | Admit: 2022-02-02 | Discharge: 2022-02-02 | Disposition: A | Discharge status: Other Acute Inpatient Hospital | Payer: 59

## 2022-02-02 ENCOUNTER — Encounter (HOSPITAL_COMMUNITY): Payer: Self-pay

## 2022-02-02 ENCOUNTER — Encounter: Payer: Self-pay | Admitting: Emergency Medicine

## 2022-02-02 ENCOUNTER — Other Ambulatory Visit: Payer: Self-pay

## 2022-02-02 DIAGNOSIS — Z20822 Contact with and (suspected) exposure to covid-19: Secondary | ICD-10-CM | POA: Insufficient documentation

## 2022-02-02 DIAGNOSIS — R0602 Shortness of breath: Secondary | ICD-10-CM

## 2022-02-02 DIAGNOSIS — K219 Gastro-esophageal reflux disease without esophagitis: Secondary | ICD-10-CM | POA: Insufficient documentation

## 2022-02-02 DIAGNOSIS — R918 Other nonspecific abnormal finding of lung field: Secondary | ICD-10-CM | POA: Insufficient documentation

## 2022-02-02 DIAGNOSIS — Z79899 Other long term (current) drug therapy: Secondary | ICD-10-CM | POA: Insufficient documentation

## 2022-02-02 DIAGNOSIS — R590 Localized enlarged lymph nodes: Secondary | ICD-10-CM | POA: Insufficient documentation

## 2022-02-02 DIAGNOSIS — C50412 Malignant neoplasm of upper-outer quadrant of left female breast: Secondary | ICD-10-CM

## 2022-02-02 DIAGNOSIS — R0902 Hypoxemia: Principal | ICD-10-CM

## 2022-02-02 DIAGNOSIS — Z171 Estrogen receptor negative status [ER-]: Secondary | ICD-10-CM

## 2022-02-02 DIAGNOSIS — Z882 Allergy status to sulfonamides status: Secondary | ICD-10-CM

## 2022-02-02 DIAGNOSIS — Z7951 Long term (current) use of inhaled steroids: Secondary | ICD-10-CM | POA: Insufficient documentation

## 2022-02-02 DIAGNOSIS — R000 Tachycardia, unspecified: Secondary | ICD-10-CM | POA: Diagnosis not present

## 2022-02-02 DIAGNOSIS — R051 Acute cough: Secondary | ICD-10-CM

## 2022-02-02 DIAGNOSIS — I10 Essential (primary) hypertension: Secondary | ICD-10-CM | POA: Insufficient documentation

## 2022-02-02 DIAGNOSIS — D869 Sarcoidosis, unspecified: Secondary | ICD-10-CM | POA: Insufficient documentation

## 2022-02-02 DIAGNOSIS — Z853 Personal history of malignant neoplasm of breast: Secondary | ICD-10-CM | POA: Insufficient documentation

## 2022-02-02 DIAGNOSIS — J45901 Unspecified asthma with (acute) exacerbation: Secondary | ICD-10-CM | POA: Diagnosis present

## 2022-02-02 DIAGNOSIS — J9601 Acute respiratory failure with hypoxia: Secondary | ICD-10-CM | POA: Diagnosis present

## 2022-02-02 LAB — CBC
HCT: 41.2 % (ref 36.0–46.0)
Hemoglobin: 14.1 g/dL (ref 12.0–15.0)
MCH: 29.3 pg (ref 26.0–34.0)
MCHC: 34.2 g/dL (ref 30.0–36.0)
MCV: 85.7 fL (ref 80.0–100.0)
Platelets: 275 10*3/uL (ref 150–400)
RBC: 4.81 MIL/uL (ref 3.87–5.11)
RDW: 14.2 % (ref 11.5–15.5)
WBC: 8.8 10*3/uL (ref 4.0–10.5)
nRBC: 0 % (ref 0.0–0.2)

## 2022-02-02 LAB — DIFFERENTIAL
Abs Immature Granulocytes: 0.02 10*3/uL (ref 0.00–0.07)
Basophils Absolute: 0.1 10*3/uL (ref 0.0–0.1)
Basophils Relative: 1 %
Eosinophils Absolute: 0.2 10*3/uL (ref 0.0–0.5)
Eosinophils Relative: 2 %
Immature Granulocytes: 0 %
Lymphocytes Relative: 8 %
Lymphs Abs: 0.7 10*3/uL (ref 0.7–4.0)
Monocytes Absolute: 0.3 10*3/uL (ref 0.1–1.0)
Monocytes Relative: 4 %
Neutro Abs: 7.5 10*3/uL (ref 1.7–7.7)
Neutrophils Relative %: 85 %

## 2022-02-02 LAB — BASIC METABOLIC PANEL
Anion gap: 10 (ref 5–15)
BUN: 6 mg/dL (ref 6–20)
CO2: 22 mmol/L (ref 22–32)
Calcium: 9 mg/dL (ref 8.9–10.3)
Chloride: 103 mmol/L (ref 98–111)
Creatinine, Ser: 0.73 mg/dL (ref 0.44–1.00)
GFR, Estimated: >60 mL/min (ref >60)
Glucose, Bld: 104 mg/dL — ABNORMAL HIGH (ref 70–99)
Potassium: 3.8 mmol/L (ref 3.5–5.1)
Sodium: 135 mmol/L (ref 135–145)

## 2022-02-02 LAB — TROPONIN I (HIGH SENSITIVITY): Troponin I (High Sensitivity): 25 ng/L — ABNORMAL HIGH (ref <18)

## 2022-02-02 LAB — BRAIN NATRIURETIC PEPTIDE: B Natriuretic Peptide: 33.4 pg/mL (ref 0.0–100.0)

## 2022-02-02 MED ORDER — DEXAMETHASONE SODIUM PHOSPHATE 10 MG/ML IJ SOLN
10.0000 mg | Freq: Once | INTRAMUSCULAR | Status: AC
Start: 2022-02-02 — End: 2022-02-02
  Administered 2022-02-02: 10 mg via INTRAVENOUS
  Filled 2022-02-02: qty 1

## 2022-02-02 MED ORDER — LACTATED RINGERS IV BOLUS (SEPSIS)
500.0000 mL | Freq: Once | INTRAVENOUS | Status: AC
  Administered 2022-02-02: 500 mL via INTRAVENOUS

## 2022-02-02 MED ORDER — IOHEXOL 350 MG/ML SOLN
100.0000 mL | Freq: Once | INTRAVENOUS | Status: AC | PRN
  Administered 2022-02-02: 100 mL via INTRAVENOUS

## 2022-02-02 MED ORDER — IPRATROPIUM-ALBUTEROL 0.5-2.5 (3) MG/3ML IN SOLN
3.0000 mL | Freq: Once | RESPIRATORY_TRACT | Status: AC
  Administered 2022-02-02: 3 mL via RESPIRATORY_TRACT
  Filled 2022-02-02: qty 3

## 2022-02-02 NOTE — Discharge Instructions (Addendum)
Sent to hospital via EMS. ?

## 2022-02-02 NOTE — ED Triage Notes (Signed)
Pt here for cough and congestion x 4 days; pt is SOB and currently getting treatment for breast CA; pt noted tachy 120bpm ?

## 2022-02-02 NOTE — ED Provider Notes (Signed)
I assumed care of patient at shift change from previous team, please see their note for full H and P.  Briefly patient is here for cough and shortness of breath.  ?She has been using her inhaler frequently at home.  She is getting immunotherapy for breast cancer.  ? ?I spoke with patient, she tells me that she has had her cough for over a year now, however over the past few days she has been having large amounts of posttussive emesis limiting her ability to tolerate p.o.'s.  She denies any fevers at home.  She denies any abdominal pain or nausea. ? ? ? ?Physical Exam  ?BP (!) 163/94   Pulse (!) 116   Temp 99.9 ?F (37.7 ?C) (Oral)   Resp (!) 25   SpO2 96%  ? ?Physical Exam ?Constitutional:   ?   General: She is not in acute distress. ?HENT:  ?   Head: Normocephalic.  ?Cardiovascular:  ?   Rate and Rhythm: Tachycardia present.  ?   Heart sounds: No murmur heard. ?Pulmonary:  ?   Effort: Tachypnea present.  ?   Breath sounds: Examination of the right-lower field reveals wheezing and rhonchi. Examination of the left-lower field reveals wheezing and rhonchi. Wheezing and rhonchi present.  ?Abdominal:  ?   Tenderness: There is no abdominal tenderness.  ?Skin: ?   General: Skin is warm and dry.  ?Neurological:  ?   General: No focal deficit present.  ?   Mental Status: She is alert.  ? ? ?Procedures  ?Procedures ? ?ED Course / MDM  ? ?Clinical Course as of 02/02/22 2302  ?Tue Feb 02, 2022  ?2132 Troponin I (High Sensitivity)(!): 25 [SB]  ?  ?Clinical Course User Index ?[SB] Blue, Soijett A, PA-C  ? ?Medical Decision Making ?Amount and/or Complexity of Data Reviewed ?Labs: ordered. Decision-making details documented in ED Course. ?Radiology: ordered. ? ?Risk ?Prescription drug management. ?Decision regarding hospitalization. ? ? ? ?Patient is currently getting immunotherapy for breast cancer.  She has had a longstanding cough however now has posttussive emesis limiting her ability to tolerate p.o.'s. ? ?Patient's  troponin is slightly elevated but flat at 25. ? ?Her CBC shows no acute abnormalities.  Blood cultures were obtained given that she is tachycardic and tachypneic.  She does not have a leukocytosis and is technically afebrile with a temp of 99.9.  Therefore she has not started on antibiotics per sepsis protocol. ? ?I spoke with hospitalist who will see patient for admission. ? ?Labs Reviewed  ?BASIC METABOLIC PANEL - Abnormal; Notable for the following components:  ?    Result Value  ? Glucose, Bld 104 (*)   ? All other components within normal limits  ?URINALYSIS, ROUTINE W REFLEX MICROSCOPIC - Abnormal; Notable for the following components:  ? Hgb urine dipstick TRACE (*)   ? All other components within normal limits  ?HEPATIC FUNCTION PANEL - Abnormal; Notable for the following components:  ? Total Protein 8.7 (*)   ? All other components within normal limits  ?URINALYSIS, MICROSCOPIC (REFLEX) - Abnormal; Notable for the following components:  ? Bacteria, UA RARE (*)   ? All other components within normal limits  ?TROPONIN I (HIGH SENSITIVITY) - Abnormal; Notable for the following components:  ? Troponin I (High Sensitivity) 25 (*)   ? All other components within normal limits  ?TROPONIN I (HIGH SENSITIVITY) - Abnormal; Notable for the following components:  ? Troponin I (High Sensitivity) 25 (*)   ? All other  components within normal limits  ?RESP PANEL BY RT-PCR (FLU A&B, COVID) [id number]  ?CULTURE, BLOOD (ROUTINE X 2)  ?CULTURE, BLOOD (ROUTINE X 2)  ?BRAIN NATRIURETIC PEPTIDE  ?LACTIC ACID, PLASMA  ?PROTIME-INR  ?APTT  ?HCG, SERUM, QUALITATIVE  ?CBC  ?DIFFERENTIAL  ?CBC  ?CBC WITH DIFFERENTIAL/PLATELET  ?LACTIC ACID, PLASMA  ?HIV ANTIBODY (ROUTINE TESTING W REFLEX)  ?CREATININE, SERUM  ?TSH  ? ? ?DG Chest 2 View ? ?Result Date: 02/02/2022 ?CLINICAL DATA:  Shortness of breath and cough. EXAM: CHEST - 2 VIEW COMPARISON:  Radiograph 04/30/2021, CT 05/29/2021 FINDINGS: Right chest port tip in the SVC.The  cardiomediastinal contours are normal. Bronchial thickening. Pulmonary vasculature is normal. No consolidation, pleural effusion, or pneumothorax. No acute osseous abnormalities are seen. Left breast and axillary surgical clips. IMPRESSION: Bronchial thickening suggesting bronchitis or asthma. Electronically Signed   By: Keith Rake M.D.   On: 02/02/2022 20:58  ? ?CT Angio Chest PE W and/or Wo Contrast ? ?Result Date: 02/02/2022 ?CLINICAL DATA:  Pulmonary embolism (PE) suspected, unknown D-dimer Cough and congestion.  Active treatment for breast cancer. EXAM: CT ANGIOGRAPHY CHEST WITH CONTRAST TECHNIQUE: Multidetector CT imaging of the chest was performed using the standard protocol during bolus administration of intravenous contrast. Multiplanar CT image reconstructions and MIPs were obtained to evaluate the vascular anatomy. RADIATION DOSE REDUCTION: This exam was performed according to the departmental dose-optimization program which includes automated exposure control, adjustment of the mA and/or kV according to patient size and/or use of iterative reconstruction technique. CONTRAST:  148m OMNIPAQUE IOHEXOL 350 MG/ML SOLN COMPARISON:  Radiograph earlier today.  Chest CT 05/29/2021 FINDINGS: Cardiovascular: There is mild motion artifact through the mid and lower lung zones that limits detailed assessment. Allowing for this, there are no filling defects within the pulmonary arteries to suggest pulmonary embolus. The thoracic aorta is normal in caliber. No aortic dissection. The heart is normal in size. No pericardial effusion. Right chest port with tip in the SVC. Mediastinum/Nodes: Enlarged right hilar lymph nodes, including a 14 mm node series 4 image 57, unchanged from prior exam. Additional low right infrahilar nodes are also not significantly changed. Shotty left hilar lymph nodes are not significantly changed. Small mediastinal lymph nodes are not enlarged by size criteria, also unchanged. There is no  new or progressive adenopathy. No internal mammary adenopathy. No axillary adenopathy. Surgical clips in the left axilla. No thyroid nodule. No esophageal wall thickening. Lungs/Pleura: Improvement in the upper lobe predominant nodularity from prior exam with tiny residual micro nodules. The right perihilar perifissural smoothly marginated 2.1 x 0.7 cm nodule series 10, image 75, is smaller than on prior exam. No new or progressive nodularity. There is no acute or focal airspace disease. Bronchial wall thickening most prominent in the lower lobes. No pleural fluid. No findings of pulmonary edema. No pneumothorax. Upper Abdomen: No acute upper abdominal findings. Musculoskeletal: No focal bone lesion or acute osseous findings. There are surgical clips in the left lateral breast/chest wall. Review of the MIP images confirms the above findings. IMPRESSION: 1. No pulmonary embolus. 2. 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 The right perihilar perifissural nodule is smaller than on prior exam. No new or progressive nodularity. 3. Unchanged hilar adenopathy, right greater than left. 4. Bronchial wall thickening most prominent in the lower lobes, can be seen with bronchitis or reactive airways disease. Electronically Signed   By: MKeith RakeM.D.   On: 02/02/2022 21:45   ? ? ? ? ? ? ? ?  ?  Lorin Glass, [place] ?02/03/22 3685 ? ?  ?Palumbo, April, MD ?02/03/22 0254 ? ?

## 2022-02-02 NOTE — ED Notes (Signed)
Ice chips given per Tyrone Nine MD  ?

## 2022-02-02 NOTE — ED Provider Notes (Signed)
[facility]    CSN: [id number] Arrival date & time: 02/02/22  1751      History   Chief Complaint Chief Complaint  Patient presents with   Cough    HPI Jeanne Evans is a 36 y.o. female.   Patient presents with cough and nasal congestion with associated shortness of breath that has been present for approximately 4 days.  Patient reports that she has a chronic cough but has worsened over the past few days and she developed shortness of breath.  Denies any known fevers or sick contacts.  Denies any associated chest pain.  Patient has used albuterol inhaler with minimal improvement.  She reports that she is followed by pulmonology for asthma.  Patient does have history of breast cancer and is receiving immunotherapy at this time.   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   Genetic testing 06/26/2020   Family history of ovarian cancer    Family history of prostate cancer    Malignant neoplasm of upper-outer quadrant of left breast in female, estrogen receptor negative (HCC) 06/03/2020    Past Surgical History:  Procedure Laterality Date   BREAST LUMPECTOMY WITH RADIOACTIVE SEED AND SENTINEL LYMPH NODE BIOPSY Left 11/19/2020   Procedure: LEFT BREAST LUMPECTOMY X 2 WITH RADIOACTIVE SEED AND SENTINEL LYMPH NODE MAPPING;  Surgeon: Harriette Bouillon, MD;  Location: [facility];  Service: General;  Laterality: Left;  PECTORAL BLOCK   BREAST REDUCTION SURGERY Right 10/27/2021   Procedure: MAMMARY REDUCTION  TO RIGHT  BREAST;  Surgeon: Allena Napoleon, MD;  Location: [facility];  Service: Plastics;  Laterality: Right;   LIPOSUCTION WITH LIPOFILLING Bilateral 10/27/2021   Procedure: BILATERAL FAT GRAFTING TO BREAST;  Surgeon: Allena Napoleon, MD;  Location: [facility];  Service: Plastics;  Laterality: Bilateral;   PORTACATH PLACEMENT Right 06/18/2020   Procedure: INSERTION PORT-A-CATH WITH ULTRASOUND GUIDANCE;  Surgeon: Harriette Bouillon, MD;  Location: [facility];  Service: General;  Laterality: Right;   RE-EXCISION OF BREAST LUMPECTOMY Left 12/09/2020   Procedure: RE-EXCISION OF LEFT BREAST LUMPECTOMY;  Surgeon: Harriette Bouillon, MD;  Location: [facility];  Service: General;  Laterality: Left;    OB History   No obstetric history on file.      Home Medications    Prior to Admission medications   Medication Sig Start Date End Date Taking? Authorizing Provider  Acetaminophen (TYLENOL) 325 MG CAPS Take by mouth.    [provider]  albuterol (VENTOLIN HFA) 108 (90 Base) MCG/ACT inhaler Inhale 2 puffs into the lungs every 6 (six) hours as needed for wheezing or shortness of breath. 07/01/21   Kalman Shan, MD  fluticasone furoate-vilanterol (BREO ELLIPTA) 100-25 MCG/INH AEPB Inhale 1 puff into the lungs daily. 07/01/21   Kalman Shan, MD  gabapentin (NEURONTIN) 300 MG capsule Take 1 capsule (300 mg total) by mouth at bedtime. 02/12/21   Loa Socks, NP  ibuprofen (ADVIL) 800 MG tablet Take 1 tablet (800 mg total) by mouth every 8 (eight) hours as needed. 11/19/20   Cornett, Maisie Fus, MD  montelukast (SINGULAIR) 10 MG tablet Take 1 tablet (10 mg total) by mouth at bedtime. 01/06/22   [name], MD  omeprazole (PRILOSEC) 40 MG capsule TAKE 1 CAPSULE(40 MG) BY MOUTH AT BEDTIME 11/23/21   Iruku, Burnice Logan, MD  ondansetron (ZOFRAN-ODT) 4 MG disintegrating tablet Take 1 tablet (4 mg total) by mouth every 8 (eight) hours as needed for  nausea or vomiting. 10/21/21   Lorelee New, PA-C  valACYclovir (VALTREX) 500 MG tablet Take 500 mg by mouth 2 (two) times daily. 05/04/21   [provider]  venlafaxine XR (EFFEXOR-XR) 75 MG 24 hr capsule Take 1 capsule (75 mg total) by mouth daily with breakfast. 02/12/21   Causey, Larna Daughters, NP    Family History Family History  Problem Relation Age of Onset   Lung cancer Maternal Uncle        dx late 73s   Prostate cancer Paternal Uncle        dx late 8s   Ovarian cancer Paternal Grandmother        dx 87s   Prostate cancer Paternal Grandfather        dx 36s   Prostate cancer Paternal Uncle        dx early 5s    Social History Social History   Tobacco Use   Smoking status: Never   Smokeless tobacco: Never  Substance Use Topics   Alcohol use: Yes    Comment: occas   Drug use: Yes    Types: Marijuana    Comment: daily     Allergies   Sulfa antibiotics   Review of Systems Review of Systems Per HPI  Physical Exam Triage Vital Signs ED Triage Vitals  Enc Vitals Group     BP 02/02/22 1808 (!) 157/105     Pulse Rate 02/02/22 1808 (!) 130     Resp 02/02/22 1808 (!) 22     Temp 02/02/22 1808 98.8 F (37.1 C)     Temp Source 02/02/22 1808 Oral     SpO2 02/02/22 1808 95 %     Weight --      Height --      Head Circumference --      Peak Flow --      Pain Score 02/02/22 1820 5     Pain Loc --      Pain Edu? --      Excl. in GC? --    No data found.  Updated Vital Signs BP (!) 157/105 (BP Location: Right Arm)   Pulse (!) 130   Temp 98.8 F (37.1 C) (Oral)   Resp (!) 22   SpO2 95%   Visual Acuity Right Eye Distance:   Left Eye Distance:   Bilateral Distance:    Right Eye Near:   Left Eye Near:    Bilateral Near:     Physical Exam Constitutional:      General: She is not in acute distress.    Appearance: Normal appearance. She is ill-appearing and toxic-appearing. She is not diaphoretic.  HENT:     Head: Normocephalic and  atraumatic.     Right Ear: Tympanic membrane and ear canal normal.     Left Ear: Tympanic membrane and ear canal normal.     Nose: Congestion present.     Mouth/Throat:     Mouth: Mucous membranes  are moist.     Pharynx: No posterior oropharyngeal erythema.  Eyes:     Extraocular Movements: Extraocular movements intact.     Conjunctiva/sclera: Conjunctivae normal.     Pupils: Pupils are equal, round, and reactive to light.  Cardiovascular:     Rate and Rhythm: Regular rhythm. Tachycardia present.     Pulses: Normal pulses.     Heart sounds: Normal heart sounds.  Pulmonary:     Effort: Pulmonary effort is normal. No respiratory distress.     Breath sounds: Normal breath sounds. No stridor. No wheezing, rhonchi or rales.     Comments: Tachypnea present Abdominal:     General: Abdomen is flat. Bowel sounds are normal.     Palpations: Abdomen is soft.  Musculoskeletal:        General: Normal range of motion.     Cervical back: Normal range of motion.  Skin:    General: Skin is warm and dry.  Neurological:     General: No focal deficit present.     Mental Status: She is alert and oriented to person, place, and time. Mental status is at baseline.  Psychiatric:        Mood and Affect: Mood normal.        Behavior: Behavior normal.     UC Treatments / Results  Labs (all labs ordered are listed, but only abnormal results are displayed) Labs Reviewed - No data to display  EKG   Radiology No results found.  Procedures Procedures (including critical care time)  Medications Ordered in UC Medications - No data to display  Initial Impression / Assessment and Plan / UC Course  I have reviewed the triage vital signs and the nursing notes.  Pertinent labs & imaging results that were available during my care of the patient were reviewed by me and considered in my medical decision making (see chart for details).     After further review of patient's chart, oncology notes a  chronic cough and suspicion of possible pneumonitis and was going to perform a CT scan if cough was persistent.  Patient is tachycardic, has high blood pressure, and oxygen saturation is in the low 90s on initial triage and physical exam.  Patient also appears to be tachypneic.  Unsure etiology of patient's symptoms but I do think that patient needs to be further evaluated and managed at the hospital given symptoms.  2 L of oxygen were applied to patient.  Patient was advised that she will need to go the hospital via EMS.  Patient was agreeable with plan and left via EMS. Final Clinical Impressions(s) / UC Diagnoses   Final diagnoses:  Shortness of breath  Acute cough  Tachycardia     Discharge Instructions      Sent to hospital via EMS.    ED Prescriptions   None    PDMP not reviewed this encounter.   Gustavus Bryant, [place] 02/02/22 [phone]

## 2022-02-02 NOTE — ED Provider Notes (Signed)
?[facility] ?Provider Note ? ? ?CSN: [id number] ?Arrival date & time: 02/02/22  8921 ? ?  ? ?History ? ?Chief Complaint  ?Patient presents with  ? Cough  ? Shortness of Breath  ? Chest Pain  ? ? ?Jeanne Evans is a 36 y.o. female with a past medical history of breast cancer who presents to the emergency department complaining of productive cough cough onset 4 days.  Denies sick contacts.  Patient has associated chest pain (onset 4 days acutely worsening today), chest tightness, palpitations (resolved at this time), nonbloody emesis, shortness of breath.  She has not tried any medications for her symptoms.  Patient has a history of asthma and has used her albuterol inhaler approximately 7 times a day with minimal improvement of her symptoms.  She is followed by pulmonology for her asthma.  Patient is currently receiving immunotherapy for her breast cancer.  Denies past medical history of COPD or heart failure.  Patient notes that she has been able to keep down broth and chicken noodle soup.  Patient does not wear oxygen at home at baseline.  Denies fever, chills, leg swelling, hematuria, dysuria, abdominal pain, nausea.  ? ? ?The history is provided by the patient. No language interpreter was used.  ? ?  ? ?Home Medications ?Prior to Admission medications   ?Medication Sig Start Date End Date Taking? Authorizing Provider  ?Acetaminophen (TYLENOL) 325 MG CAPS Take by mouth.    [provider]  ?albuterol (VENTOLIN HFA) 108 (90 Base) MCG/ACT inhaler Inhale 2 puffs into the lungs every 6 (six) hours as needed for wheezing or shortness of breath. 07/01/21   Brand Males, MD  ?fluticasone furoate-vilanterol (BREO ELLIPTA) 100-25 MCG/INH AEPB Inhale 1 puff into the lungs daily. 07/01/21   Brand Males, MD  ?gabapentin (NEURONTIN) 300 MG capsule Take 1 capsule (300 mg total) by mouth at bedtime. 02/12/21   Gardenia Phlegm, NP  ?ibuprofen (ADVIL) 800 MG tablet  Take 1 tablet (800 mg total) by mouth every 8 (eight) hours as needed. 11/19/20   Cornett, [name], MD  ?montelukast (SINGULAIR) 10 MG tablet Take 1 tablet (10 mg total) by mouth at bedtime. 01/06/22   Benay Pike, MD  ?omeprazole (PRILOSEC) 40 MG capsule TAKE 1 CAPSULE(40 MG) BY MOUTH AT BEDTIME 11/23/21   Benay Pike, MD  ?ondansetron (ZOFRAN-ODT) 4 MG disintegrating tablet Take 1 tablet (4 mg total) by mouth every 8 (eight) hours as needed for nausea or vomiting. 10/21/21   Corena Herter, PA-C  ?valACYclovir (VALTREX) 500 MG tablet Take 500 mg by mouth 2 (two) times daily. 05/04/21   [provider]  ?venlafaxine XR (EFFEXOR-XR) 75 MG 24 hr capsule Take 1 capsule (75 mg total) by mouth daily with breakfast. 02/12/21   Causey, Charlestine Massed, NP  ?   ? ?Allergies    ?Sulfa antibiotics   ? ?Review of Systems   ?Review of Systems  ?Constitutional:  Negative for chills and fever.  ?Respiratory:  Positive for cough (productive), chest tightness and shortness of breath.   ?Cardiovascular:  Positive for chest pain and palpitations (resolved). Negative for leg swelling.  ?Gastrointestinal:  Positive for vomiting (nonbloody). Negative for abdominal pain and nausea.  ?Genitourinary:  Negative for dysuria and hematuria.  ?All other systems reviewed and are negative. ? ?Physical Exam ?Updated Vital Signs ?BP (!) 153/99 (BP Location: Right Arm)   Pulse (!) 108   Temp 98.9 ?F (37.2 ?C) (Oral)   Resp 16  SpO2 97%  ?Physical Exam ?Vitals and nursing note reviewed.  ?Constitutional:   ?   General: She is not in acute distress. ?   Appearance: She is not diaphoretic.  ?   Comments: Uncomfortable appearing.  ?HENT:  ?   Head: Normocephalic and atraumatic.  ?   Mouth/Throat:  ?   Pharynx: No oropharyngeal exudate.  ?Eyes:  ?   General: No scleral icterus. ?   Conjunctiva/sclera: Conjunctivae normal.  ?Cardiovascular:  ?   Rate and Rhythm: Normal rate and regular rhythm.  ?   Pulses: Normal pulses.  ?   Heart  sounds: Normal heart sounds.  ?Pulmonary:  ?   Effort: Pulmonary effort is normal. No respiratory distress.  ?   Breath sounds: Normal breath sounds. Decreased air movement present. No wheezing.  ?   Comments: Diminished breath sounds noted throughout ?Chest:  ?   Comments: No chest wall tenderness to palpation. ?Abdominal:  ?   General: Bowel sounds are normal.  ?   Palpations: Abdomen is soft. There is no mass.  ?   Tenderness: There is no abdominal tenderness. There is no guarding or rebound.  ?Musculoskeletal:     ?   General: Normal range of motion.  ?   Cervical back: Normal range of motion and neck supple.  ?Skin: ?   General: Skin is warm and dry.  ?Neurological:  ?   Mental Status: She is alert.  ?Psychiatric:     ?   Behavior: Behavior normal.  ? ? ?ED Results / Procedures / Treatments   ?Labs ?(all labs ordered are listed, but only abnormal results are displayed) ?Labs Reviewed - No data to display ? ?EKG ?None ? ?Radiology ?No results found. ? ?Procedures ?Procedures  ? ? ?Medications Ordered in ED ?Medications - No data to display ? ?ED Course/ Medical Decision Making/ A&P ?Clinical Course as of 02/02/22 2224  ?Tue Feb 02, 2022  ?2132 Troponin I (High Sensitivity)(!): 25 [SB]  ?  ?Clinical Course User Index ?[SB] Jaquisha Frech A, PA-C  ? ?                        ?Medical Decision Making ?Amount and/or Complexity of Data Reviewed ?Labs: ordered. Decision-making details documented in ED Course. ?Radiology: ordered. ? ?Risk ?Prescription drug management. ? ? ?Pt presents with productive cough, shortness of breath, chest pain onset 4 days.  Patient is currently receiving immunotherapy for breast cancer.  Denies sick contacts.  Vital signs, patient tachycardic, afebrile at this time.  Patient is on 3 L of oxygen via nasal cannula with oxygen saturation at 96-97%.  On exam patient with diminished breath sounds noted throughout all lung fields.  No chest wall tenderness to palpation.  No acute cardiovascular  or abdominal exam findings.  Differential diagnosis includes asthma exacerbation, pulmonary embolism, pneumonia, malignancy.  ? ? ?Additional history obtained:  ?External records from outside source obtained and reviewed including: Patient was evaluated in urgent care today for similar concerns.  Patient was instructed to come into the emergency department for further evaluation of her symptoms. ? ?Labs:  ?I ordered, and personally interpreted labs.  The pertinent results include:   ?BNP negative at 33.4. ?BMP overall unremarkable.   ?CBC obtained with results pending at time of signout. ?Initial troponin elevated at 25, repeat troponin pending at time of signout. ? ?Imaging: ?I ordered imaging studies including chest x-ray, CTA PE ?I independently visualized and interpreted imaging which showed:  Chest x-ray notable for bronchial thickening. ?CTA PE:  ?1. No pulmonary embolus.  ?2. Improvement in upper lobe predominant nodularity from prior exam,  ?possibly related to sarcoidosis. The right perihilar perifissural  ?nodule is smaller than on prior exam. No new or progressive  ?nodularity.  ?3. Unchanged hilar adenopathy, right greater than left.  ?4. Bronchial wall thickening most prominent in the lower lobes, can  ?be seen with bronchitis or reactive airways disease.  ? ?I agree with the radiologist interpretation ? ?Medications:  ?I ordered medication including Decadron, DuoNeb for symptom management ? ?Patient case discussed with Wyn Quaker, PA-C at sign-out. Plan at sign-out is pending labs for likely admission due to new oxygen requirement. Patient care transferred at sign out.  ? ?This chart was dictated using voice recognition software, Dragon. Despite the best efforts of this provider to proofread and correct errors, errors may still occur which can change documentation meaning. ? ?Final Clinical Impression(s) / ED Diagnoses ?Final diagnoses:  ?None  ? ? ?Rx / DC Orders ?ED Discharge Orders   ? ? None   ? ?  ? ? ?  ?Markiya Keefe A, PA-C ?02/02/22 2224 ? ?  ?Deno Etienne, DO ?02/02/22 2248 ? ?

## 2022-02-02 NOTE — ED Notes (Signed)
Pt ambulatory to bathroom with steady gait. Pt on 2L portable O2. Specimen cup provided.  ?

## 2022-02-02 NOTE — ED Triage Notes (Signed)
Pt BIB EMS with complaints of cough, congestion, and chest pain x 4 days; pt is having SHOB and is currently getting treatment for breast CA.  ?

## 2022-02-02 NOTE — ED Notes (Signed)
Provider at bedside. Will draw labs and start fluids when patient available. ?

## 2022-02-03 ENCOUNTER — Encounter (HOSPITAL_COMMUNITY): Payer: Self-pay | Admitting: Internal Medicine

## 2022-02-03 DIAGNOSIS — Z79899 Other long term (current) drug therapy: Secondary | ICD-10-CM | POA: Diagnosis not present

## 2022-02-03 DIAGNOSIS — J45901 Unspecified asthma with (acute) exacerbation: Secondary | ICD-10-CM | POA: Diagnosis present

## 2022-02-03 DIAGNOSIS — Z7951 Long term (current) use of inhaled steroids: Secondary | ICD-10-CM | POA: Diagnosis not present

## 2022-02-03 DIAGNOSIS — J9601 Acute respiratory failure with hypoxia: Secondary | ICD-10-CM | POA: Diagnosis present

## 2022-02-03 DIAGNOSIS — R918 Other nonspecific abnormal finding of lung field: Secondary | ICD-10-CM | POA: Diagnosis present

## 2022-02-03 DIAGNOSIS — K219 Gastro-esophageal reflux disease without esophagitis: Secondary | ICD-10-CM | POA: Diagnosis present

## 2022-02-03 DIAGNOSIS — R590 Localized enlarged lymph nodes: Secondary | ICD-10-CM | POA: Diagnosis present

## 2022-02-03 DIAGNOSIS — I10 Essential (primary) hypertension: Secondary | ICD-10-CM | POA: Diagnosis present

## 2022-02-03 DIAGNOSIS — D869 Sarcoidosis, unspecified: Secondary | ICD-10-CM | POA: Diagnosis present

## 2022-02-03 DIAGNOSIS — Z853 Personal history of malignant neoplasm of breast: Secondary | ICD-10-CM | POA: Diagnosis not present

## 2022-02-03 DIAGNOSIS — Z20822 Contact with and (suspected) exposure to covid-19: Secondary | ICD-10-CM | POA: Diagnosis present

## 2022-02-03 DIAGNOSIS — R000 Tachycardia, unspecified: Secondary | ICD-10-CM | POA: Diagnosis present

## 2022-02-03 DIAGNOSIS — R0602 Shortness of breath: Secondary | ICD-10-CM | POA: Diagnosis present

## 2022-02-03 DIAGNOSIS — Z882 Allergy status to sulfonamides status: Secondary | ICD-10-CM | POA: Diagnosis not present

## 2022-02-03 LAB — HEPATIC FUNCTION PANEL
ALT: 18 U/L (ref 0–44)
AST: 20 U/L (ref 15–41)
Albumin: 4.6 g/dL (ref 3.5–5.0)
Alkaline Phosphatase: 90 U/L (ref 38–126)
Bilirubin, Direct: 0.1 mg/dL (ref 0.0–0.2)
Indirect Bilirubin: 0.9 mg/dL (ref 0.3–0.9)
Total Bilirubin: 1 mg/dL (ref 0.3–1.2)
Total Protein: 8.7 g/dL — ABNORMAL HIGH (ref 6.5–8.1)

## 2022-02-03 LAB — URINALYSIS, MICROSCOPIC (REFLEX)

## 2022-02-03 LAB — CBC
HCT: 41.3 % (ref 36.0–46.0)
Hemoglobin: 13.7 g/dL (ref 12.0–15.0)
MCH: 28.6 pg (ref 26.0–34.0)
MCHC: 33.2 g/dL (ref 30.0–36.0)
MCV: 86.2 fL (ref 80.0–100.0)
Platelets: 277 10*3/uL (ref 150–400)
RBC: 4.79 MIL/uL (ref 3.87–5.11)
RDW: 14.4 % (ref 11.5–15.5)
WBC: 8.7 10*3/uL (ref 4.0–10.5)
nRBC: 0 % (ref 0.0–0.2)

## 2022-02-03 LAB — URINALYSIS, ROUTINE W REFLEX MICROSCOPIC
Bilirubin Urine: NEGATIVE
Glucose, UA: NEGATIVE mg/dL
Ketones, ur: NEGATIVE mg/dL
Leukocytes,Ua: NEGATIVE
Nitrite: NEGATIVE
Protein, ur: NEGATIVE mg/dL
Specific Gravity, Urine: 1.02 (ref 1.005–1.030)
pH: 6.5 (ref 5.0–8.0)

## 2022-02-03 LAB — TROPONIN I (HIGH SENSITIVITY): Troponin I (High Sensitivity): 25 ng/L — ABNORMAL HIGH (ref <18)

## 2022-02-03 LAB — LACTIC ACID, PLASMA
Lactic Acid, Venous: 1.1 mmol/L (ref 0.5–1.9)
Lactic Acid, Venous: 1.2 mmol/L (ref 0.5–1.9)

## 2022-02-03 LAB — PROTIME-INR
INR: 1 (ref 0.8–1.2)
Prothrombin Time: 13.5 seconds (ref 11.4–15.2)

## 2022-02-03 LAB — HCG, SERUM, QUALITATIVE: Preg, Serum: NEGATIVE

## 2022-02-03 LAB — CREATININE, SERUM
Creatinine, Ser: 0.8 mg/dL (ref 0.44–1.00)
GFR, Estimated: >60 mL/min (ref >60)

## 2022-02-03 LAB — RESP PANEL BY RT-PCR (FLU A&B, COVID) ARPGX2
Influenza A by PCR: NEGATIVE
Influenza B by PCR: NEGATIVE
SARS Coronavirus 2 by RT PCR: NEGATIVE

## 2022-02-03 LAB — HIV ANTIBODY (ROUTINE TESTING W REFLEX): HIV Screen 4th Generation wRfx: NONREACTIVE

## 2022-02-03 LAB — TSH: TSH: 0.258 u[IU]/mL — ABNORMAL LOW (ref 0.350–4.500)

## 2022-02-03 LAB — APTT: aPTT: 27 seconds (ref 24–36)

## 2022-02-03 MED ORDER — HYDRALAZINE HCL 20 MG/ML IJ SOLN
5.0000 mg | INTRAMUSCULAR | Status: DC | PRN
Start: 2022-02-03 — End: 2022-02-06

## 2022-02-03 MED ORDER — ALBUTEROL SULFATE (2.5 MG/3ML) 0.083% IN NEBU: 2.5000 mg | INHALATION_SOLUTION | Freq: Four times a day (QID) | RESPIRATORY_TRACT | Status: DC | PRN

## 2022-02-03 MED ORDER — ACETAMINOPHEN 650 MG RE SUPP
650.0000 mg | Freq: Four times a day (QID) | RECTAL | Status: DC | PRN
Start: 2022-02-03 — End: 2022-02-06

## 2022-02-03 MED ORDER — ACETAMINOPHEN 325 MG PO TABS
650.0000 mg | ORAL_TABLET | Freq: Four times a day (QID) | ORAL | Status: DC | PRN
  Administered 2022-02-03: 650 mg via ORAL
  Filled 2022-02-03: qty 2

## 2022-02-03 MED ORDER — METHYLPREDNISOLONE SODIUM SUCC 40 MG IJ SOLR
40.0000 mg | INTRAMUSCULAR | Status: DC
  Administered 2022-02-04: 40 mg via INTRAVENOUS
  Filled 2022-02-03: qty 1

## 2022-02-03 MED ORDER — ALBUTEROL SULFATE (2.5 MG/3ML) 0.083% IN NEBU
2.5000 mg | INHALATION_SOLUTION | Freq: Three times a day (TID) | RESPIRATORY_TRACT | Status: DC
  Administered 2022-02-04 – 2022-02-06 (×7): 2.5 mg via RESPIRATORY_TRACT
  Filled 2022-02-03 (×7): qty 3

## 2022-02-03 MED ORDER — ENOXAPARIN SODIUM 40 MG/0.4ML IJ SOSY
40.0000 mg | PREFILLED_SYRINGE | INTRAMUSCULAR | Status: DC
  Administered 2022-02-03 – 2022-02-06 (×4): 40 mg via SUBCUTANEOUS
  Filled 2022-02-03 (×4): qty 0.4

## 2022-02-03 MED ORDER — MONTELUKAST SODIUM 10 MG PO TABS
10.0000 mg | ORAL_TABLET | Freq: Every day | ORAL | Status: DC
  Administered 2022-02-03 – 2022-02-05 (×3): 10 mg via ORAL
  Filled 2022-02-03 (×3): qty 1

## 2022-02-03 MED ORDER — BUDESONIDE 0.25 MG/2ML IN SUSP
0.2500 mg | Freq: Two times a day (BID) | RESPIRATORY_TRACT | Status: DC
  Administered 2022-02-03 – 2022-02-06 (×7): 0.25 mg via RESPIRATORY_TRACT
  Filled 2022-02-03 (×7): qty 2

## 2022-02-03 MED ORDER — LEVALBUTEROL HCL 0.63 MG/3ML IN NEBU: 0.6300 mg | INHALATION_SOLUTION | Freq: Four times a day (QID) | RESPIRATORY_TRACT | Status: DC | PRN

## 2022-02-03 MED ORDER — LEVALBUTEROL HCL 0.63 MG/3ML IN NEBU
0.6300 mg | INHALATION_SOLUTION | Freq: Four times a day (QID) | RESPIRATORY_TRACT | Status: DC
  Administered 2022-02-03 (×3): 0.63 mg via RESPIRATORY_TRACT
  Filled 2022-02-03 (×3): qty 3

## 2022-02-03 MED ORDER — GABAPENTIN 300 MG PO CAPS: 300.0000 mg | ORAL_CAPSULE | Freq: Every day | ORAL | Status: DC | PRN

## 2022-02-03 MED ORDER — METHYLPREDNISOLONE SODIUM SUCC 40 MG IJ SOLR
40.0000 mg | Freq: Two times a day (BID) | INTRAMUSCULAR | Status: DC
  Administered 2022-02-03: 40 mg via INTRAVENOUS
  Filled 2022-02-03 (×2): qty 1

## 2022-02-03 NOTE — Progress Notes (Signed)
36 year old female with history of asthma and sarcoidosis admitted with shortness of breath and increased wheezing.  She is being treated with IV steroids and nebulizer treatments.  She is tachycardic tachypneic and is on 2 L of oxygen with saturation 94%. ?Patient seen and examined today. ?She reports feeling better but not yet back to her baseline. ?We will get her out of bed ambulate and check pulse ox with ambulation. ?Encourage incentive spirometr ?

## 2022-02-03 NOTE — H&P (Signed)
?History and Physical  ? ? ?Jeanne Evans BJS:[id number] DOB: May 30, 1986 DOA: 02/02/2022 ? ?PCP: [name], MD  ?Patient coming from: Home. ? ?Chief Complaint: Shortness of breath. ? ?HPI: Jeanne Evans is a 36 y.o. female with history of breast cancer being treated with adjuvant immunotherapy by patient's oncologist Dr. Chryl Heck presents to the ER because of worsening shortness of breath and wheezing.  Patient has been followed up by pulmonologist Dr. Chase Caller and was diagnosed with possible sarcoidosis and asthma.  Patient states she has been in chronic cough.  But last few days her cough and wheezing is worsened.  Denies any chest pain fever chills or productive cough. ? ?ED Course: In the ER patient was tachycardic and wheezing.  CT angiogram of the chest was negative for PE but did show features concerning for reactive airway disease and bronchitis.  Nodules in the lung and hilar adenopathy which is known with possible history of sarcoidosis.  Given the patient having persistent wheezing admitted for further management of asthma exacerbation.  COVID test was negative.  EKG shows sinus tachycardia. ? ?Review of Systems: As per HPI, rest all negative. ? ? ?Past Medical History:  ?Diagnosis Date  ? Asthma 07/31/2021  ? Breast cancer ([place])   ? Family history of ovarian cancer   ? Family history of prostate cancer   ? GERD (gastroesophageal reflux disease)   ? History of asthma   ? has albuterol, uses PRN  ? History of heart murmur in childhood   ? History of multiple allergies   ? ? ?Past Surgical History:  ?Procedure Laterality Date  ? BREAST LUMPECTOMY WITH RADIOACTIVE SEED AND SENTINEL LYMPH NODE BIOPSY Left 11/19/2020  ? Procedure: LEFT BREAST LUMPECTOMY X 2 WITH RADIOACTIVE SEED AND SENTINEL LYMPH NODE MAPPING;  Surgeon: Erroll Luna, MD;  Location: [facility];  Service: General;  Laterality: Left;  PECTORAL BLOCK  ? BREAST REDUCTION SURGERY Right 10/27/2021  ? Procedure: MAMMARY  REDUCTION  TO RIGHT BREAST;  Surgeon: [name], MD;  Location: [facility];  Service: Plastics;  Laterality: Right;  ? LIPOSUCTION WITH LIPOFILLING Bilateral 10/27/2021  ? Procedure: BILATERAL FAT GRAFTING TO BREAST;  Surgeon: [name], MD;  Location: [facility];  Service: Plastics;  Laterality: Bilateral;  ? PORTACATH PLACEMENT Right 06/18/2020  ? Procedure: INSERTION PORT-A-CATH WITH ULTRASOUND GUIDANCE;  Surgeon: Erroll Luna, MD;  Location: [facility];  Service: General;  Laterality: Right;  ? RE-EXCISION OF BREAST LUMPECTOMY Left 12/09/2020  ? Procedure: RE-EXCISION OF LEFT BREAST LUMPECTOMY;  Surgeon: Erroll Luna, MD;  Location: [facility];  Service: General;  Laterality: Left;  ? ? ? reports that she has never smoked. She has never used smokeless tobacco. She reports current alcohol use. She reports current drug use. Drug: Marijuana. ? ?Allergies  ?Allergen Reactions  ? Sulfa Antibiotics Hives  ? ? ?Family History  ?Problem Relation Age of Onset  ? Lung cancer Maternal Uncle   ?     dx late 29s  ? Prostate cancer Paternal Uncle   ?     dx late 36s  ? Ovarian cancer Paternal Grandmother   ?     dx 47s  ? Prostate cancer Paternal Grandfather   ?     dx 50s  ? Prostate cancer Paternal Uncle   ?     dx early 36s  ? ? ?Prior to Admission medications   ?Medication Sig Start Date End Date  Taking? Authorizing Provider  ?albuterol (VENTOLIN HFA) 108 (90 Base) MCG/ACT inhaler Inhale 2 puffs into the lungs every 6 (six) hours as needed for wheezing or shortness of breath. 07/01/21  Yes Brand Males, MD  ?fluticasone furoate-vilanterol (BREO ELLIPTA) 100-25 MCG/INH AEPB Inhale 1 puff into the lungs daily. 07/01/21  Yes Brand Males, MD  ?gabapentin (NEURONTIN) 300 MG capsule Take 1 capsule (300 mg total) by mouth at bedtime. ?Patient taking differently: Take 300 mg by mouth daily as needed (for nerve pain). 02/12/21  Yes Causey, Charlestine Massed, NP  ?lidocaine-prilocaine (EMLA) cream Apply 1 application. topically as needed (for port access). 09/17/21  Yes [provider]  ?montelukast (SINGULAIR) 10 MG tablet Take 1 tablet (10 mg total) by mouth at bedtime. 01/06/22  Yes Benay Pike, MD  ?omeprazole (PRILOSEC) 40 MG capsule TAKE 1 CAPSULE(40 MG) BY MOUTH AT BEDTIME ?Patient taking differently: Take 40 mg by mouth at bedtime. 11/23/21  Yes Benay Pike, MD  ?ondansetron (ZOFRAN-ODT) 4 MG disintegrating tablet Take 1 tablet (4 mg total) by mouth every 8 (eight) hours as needed for nausea or vomiting. 10/21/21  Yes Corena Herter, PA-C  ?Acetaminophen (TYLENOL PO) Take by mouth. ?Patient not taking: Reported on 02/03/2022    [provider]  ?ibuprofen (ADVIL) 800 MG tablet Take 1 tablet (800 mg total) by mouth every 8 (eight) hours as needed. 11/19/20   Cornett, [name], MD  ?valACYclovir (VALTREX) 500 MG tablet Take 500 mg by mouth 2 (two) times daily. ?Patient not taking: Reported on 02/03/2022 05/04/21   [provider]  ?venlafaxine XR (EFFEXOR-XR) 75 MG 24 hr capsule Take 1 capsule (75 mg total) by mouth daily with breakfast. ?Patient not taking: Reported on 02/03/2022 02/12/21   Gardenia Phlegm, NP  ? ? ?Physical Exam: ?Constitutional: Moderately built and nourished. ?Vitals:  ? 02/02/22 2015 02/02/22 2147 02/02/22 2200 02/02/22 2220  ?BP: (!) 167/112 (!) 159/109 (!) 163/94   ?Pulse: (!) 121 (!) 111 (!) 116   ?Resp: 16 (!) 27 (!) 25   ?Temp:    99.9 ?F (37.7 ?C)  ?TempSrc:    Oral  ?SpO2: 96% 94% 96%   ? ?Eyes: Anicteric no pallor. ?ENMT: No discharge from the ears eyes nose and mouth. ?Neck: No JVD appreciated no mass felt. ?Respiratory: Bilateral expiratory wheeze and no crepitations. ?Cardiovascular: S1-S2 heard. ?Abdomen: Soft nontender bowel sound present. ?Musculoskeletal: No edema. ?Skin: No rash. ?Neurologic: Alert awake oriented time place and person.  Moves all extremities. ?Psychiatric: Appears  normal.  Normal affect. ? ? ?Labs on Admission: I have personally reviewed following labs and imaging studies ? ?CBC: ?Recent Labs  ?Lab 01/28/22 ?0913 02/02/22 ?2252  ?WBC 3.9* 8.8  ?NEUTROABS 1.5* 7.5  ?HGB 12.2 14.1  ?HCT 35.8* 41.2  ?MCV 84.6 85.7  ?PLT 239 275  ? ?Basic Metabolic Panel: ?Recent Labs  ?Lab 01/28/22 ?0913 02/02/22 ?2019  ?NA 137 135  ?K 3.8 3.8  ?CL 104 103  ?CO2 28 22  ?GLUCOSE 131* 104*  ?BUN 11 6  ?CREATININE 0.90 0.73  ?CALCIUM 9.2 9.0  ? ?GFR: ?Estimated Creatinine Clearance: 97.7 mL/min (by C-G formula based on SCr of 0.73 mg/dL). ?Liver Function Tests: ?Recent Labs  ?Lab 01/28/22 ?0913 02/02/22 ?2252  ?AST 12* 20  ?ALT 11 18  ?ALKPHOS 82 90  ?BILITOT 0.6 1.0  ?PROT 7.3 8.7*  ?ALBUMIN 4.2 4.6  ? ?No results for input(s): LIPASE, AMYLASE in the last 168 hours. ?No results for input(s): AMMONIA in the  last 168 hours. ?Coagulation Profile: ?Recent Labs  ?Lab 02/03/22 ?0005  ?INR 1.0  ? ?Cardiac Enzymes: ?No results for input(s): CKTOTAL, CKMB, CKMBINDEX, TROPONINI in the last 168 hours. ?BNP (last 3 results) ?No results for input(s): PROBNP in the last 8760 hours. ?HbA1C: ?No results for input(s): HGBA1C in the last 72 hours. ?CBG: ?No results for input(s): GLUCAP in the last 168 hours. ?Lipid Profile: ?No results for input(s): CHOL, HDL, LDLCALC, TRIG, CHOLHDL, LDLDIRECT in the last 72 hours. ?Thyroid Function Tests: ?No results for input(s): TSH, T4TOTAL, FREET4, T3FREE, THYROIDAB in the last 72 hours. ?Anemia Panel: ?No results for input(s): VITAMINB12, FOLATE, FERRITIN, TIBC, IRON, RETICCTPCT in the last 72 hours. ?Urine analysis: ?   ?Component Value Date/Time  ? [place] YELLOW 02/02/2022 2309  ? APPEARANCEUR CLEAR 02/02/2022 2309  ? LABSPEC 1.020 02/02/2022 2309  ? PHURINE 6.5 02/02/2022 2309  ? GLUCOSEU NEGATIVE 02/02/2022 2309  ? HGBUR TRACE (A) 02/02/2022 2309  ? [place] NEGATIVE 02/02/2022 2309  ? [place] NEGATIVE 02/02/2022 2309  ? [place] NEGATIVE 02/02/2022 2309  ?  NITRITE NEGATIVE 02/02/2022 2309  ? LEUKOCYTESUR NEGATIVE 02/02/2022 2309  ? ?Sepsis Labs: ?'@LABRCNTIP'$ (procalcitonin:4,lacticidven:4) ?) ?Recent Results (from the past 240 hour(s))  ?Resp Panel by RT-PCR (Flu A&B, Co

## 2022-02-04 DIAGNOSIS — J9601 Acute respiratory failure with hypoxia: Secondary | ICD-10-CM | POA: Diagnosis not present

## 2022-02-04 MED ORDER — BENZONATATE 100 MG PO CAPS
200.0000 mg | ORAL_CAPSULE | Freq: Three times a day (TID) | ORAL | Status: DC | PRN
  Administered 2022-02-04 – 2022-02-05 (×3): 200 mg via ORAL
  Filled 2022-02-04 (×3): qty 2

## 2022-02-04 MED ORDER — ONDANSETRON HCL 4 MG/2ML IJ SOLN
4.0000 mg | Freq: Four times a day (QID) | INTRAMUSCULAR | Status: DC | PRN
  Administered 2022-02-04: 4 mg via INTRAVENOUS
  Filled 2022-02-04: qty 2

## 2022-02-04 MED ORDER — SODIUM CHLORIDE 0.9 % IV SOLN
500.0000 mg | Freq: Every day | INTRAVENOUS | Status: DC
  Administered 2022-02-04: 500 mg via INTRAVENOUS
  Filled 2022-02-04: qty 5

## 2022-02-04 MED ORDER — GUAIFENESIN-DM 100-10 MG/5ML PO SYRP
5.0000 mL | ORAL_SOLUTION | ORAL | Status: DC | PRN
  Administered 2022-02-04 – 2022-02-05 (×3): 5 mL via ORAL
  Filled 2022-02-04 (×3): qty 10

## 2022-02-04 MED ORDER — AZITHROMYCIN 250 MG PO TABS
500.0000 mg | ORAL_TABLET | Freq: Every day | ORAL | Status: DC
  Administered 2022-02-05 – 2022-02-06 (×2): 500 mg via ORAL
  Filled 2022-02-04 (×2): qty 2

## 2022-02-04 MED ORDER — METHYLPREDNISOLONE SODIUM SUCC 40 MG IJ SOLR
40.0000 mg | Freq: Two times a day (BID) | INTRAMUSCULAR | Status: DC
  Administered 2022-02-04 – 2022-02-06 (×4): 40 mg via INTRAVENOUS
  Filled 2022-02-04 (×4): qty 1

## 2022-02-04 NOTE — Progress Notes (Signed)
?  Transition of Care (TOC) Screening Note ? ? ?Patient Details  ?Name: Jeanne Evans ?Date of Birth: 1985/12/29 ? ? ?Transition of Care (TOC) CM/SW Contact:    ?Ellsie Violette, Marjie Skiff, RN ?Phone Number: ?02/04/2022, 12:30 PM ? ? ? ?Transition of Care Department [facility]) has reviewed patient and no TOC needs have been identified at this time. We will continue to monitor patient advancement through interdisciplinary progression rounds. If new patient transition needs arise, please place a TOC consult. ?  ?

## 2022-02-04 NOTE — Progress Notes (Signed)
?PROGRESS NOTE ? ? ? ?Jeanne Evans  EZM:[id number] DOB: Jun 24, 1986 DOA: 02/02/2022 ?PCP: [name], MD  ?Brief Narrative: 36 year old female with history of sarcoidosis and asthma and breast cancer on immunotherapy admitted with dyspnea on exertion and shortness of breath.  She reports her cough is worse and she is unable to ambulate even to the restroom without feeling short of breath which is unusual for her.  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  CT angiogram of the chest negative for PE but concerning for reactive airway disease and bronchitis.  Lung nodules and hilar adenopathy possibly secondary to sarcoidosis.  COVID-negative. ? ?Assessment & Plan: ?  ?Principal Problem: ?  Acute respiratory failure with hypoxemia ([place]) ?Active Problems: ?  Malignant neoplasm of upper-outer quadrant of left breast in female, estrogen receptor negative ([place]) ?  Acute respiratory failure with hypoxia ([place]) ? ? ?#1 asthma exacerbation in the setting of sarcoidosis and history of breast cancer on immunotherapy.  CT chest shows no evidence of pneumonitis or pulmonary embolism.  But it is concerning for reactive airway disease and bronchitis. ?She has been intermittently tachypneic and tachycardic and is not back to her baseline.  On 2 L of oxygen saturating 94%. ?She has been treated with steroids nebulizers Pulmicort, albuterol and Singulair. ?Will discuss with PCCM. ?She reports she is bringing up thick yellow phlegm.  We will add Z-Pak to possibly cover bronchitis. ?Echocardiogram September 2022 with ejection fraction 65 to 70%.  Normal left ventricular function.  No regional wall motion abnormality. ? ?#2 history of sarcoidosis she has appointment to follow-up with Dr. Chase Caller this month. ? ?#3 history of breast cancer followed by Dr. Charlena Cross ? discussed with her. ? ?#4 hypertension is stable ? ? ? ?Estimated body mass index is 30.89 kg/m? as calculated from the  following: ?  Height as of this encounter: '5\' 3"'$  (1.6 m). ?  Weight as of this encounter: 79.1 kg. ? ?DVT prophylaxis: Lovenox ?Code Status: full code ?Family Communication: None at bedside ?Disposition Plan:  Status is: Inpatient ?Remains inpatient appropriate because: Short of breath with minimal exertion ?  ?Consultants:  ?None ? ?Procedures: None ?Antimicrobials: Z-Pak ? ?Subjective: ?Patient reports breathing has not gotten better remains intermittently tachycardic and tachypneic ?Reports she is short of breath walking to the restroom and have to stop to catch breath ?Complains of cough with thick yellow phlegm ? ?Objective: ?Vitals:  ? 02/03/22 2024 02/03/22 2117 02/04/22 0501 02/04/22 0746  ?BP:  128/79 134/82   ?Pulse: (!) 103 (!) 103 96   ?Resp: '20 20 20   '$ ?Temp:  99.3 ?F (37.4 ?C) 97.8 ?F (36.6 ?C)   ?TempSrc:  Oral Oral   ?SpO2: 97% 100% 100% 94%  ?Weight:      ?Height:      ? ? ?Intake/Output Summary (Last 24 hours) at 02/04/2022 1037 ?Last data filed at 02/03/2022 2123 ?Gross per 24 hour  ?Intake 600 ml  ?Output 300 ml  ?Net 300 ml  ? ?Filed Weights  ? 02/03/22 0405  ?Weight: 79.1 kg  ? ? ?Examination: ? ?General exam: Appears in mild distress due to sob ?Respiratory system: Coarse and wheezing b/l to auscultation. Respiratory effort normal. ?Cardiovascular system: S1 & S2 heard, RRR. No JVD, murmurs, rubs, gallops or clicks. No pedal edema. ?Gastrointestinal system: Abdomen is nondistended, soft and nontender. No organomegaly or masses felt. Normal bowel sounds heard. ?Central nervous system: Alert and oriented. No  focal neurological deficits. ?Extremities:  no edema  ?Skin: No rashes, lesions or ulcers ?Psychiatry: Judgement and insight appear normal. Mood & affect appropriate.  ? ? ? ?Data Reviewed: I have personally reviewed following labs and imaging studies ? ?CBC: ?Recent Labs  ?Lab 02/02/22 ?2252 02/03/22 ?0110  ?WBC 8.8 8.7  ?NEUTROABS 7.5  --   ?HGB 14.1 13.7  ?HCT 41.2 41.3  ?MCV 85.7 86.2   ?PLT 275 277  ? ?Basic Metabolic Panel: ?Recent Labs  ?Lab 02/02/22 ?2019 02/03/22 ?0110  ?NA 135  --   ?K 3.8  --   ?CL 103  --   ?CO2 22  --   ?GLUCOSE 104*  --   ?BUN 6  --   ?CREATININE 0.73 0.80  ?CALCIUM 9.0  --   ? ?GFR: ?Estimated Creatinine Clearance: 95.9 mL/min (by C-G formula based on SCr of 0.8 mg/dL). ?Liver Function Tests: ?Recent Labs  ?Lab 02/02/22 ?2252  ?AST 20  ?ALT 18  ?ALKPHOS 90  ?BILITOT 1.0  ?PROT 8.7*  ?ALBUMIN 4.6  ? ?No results for input(s): LIPASE, AMYLASE in the last 168 hours. ?No results for input(s): AMMONIA in the last 168 hours. ?Coagulation Profile: ?Recent Labs  ?Lab 02/03/22 ?0005  ?INR 1.0  ? ?Cardiac Enzymes: ?No results for input(s): CKTOTAL, CKMB, CKMBINDEX, TROPONINI in the last 168 hours. ?BNP (last 3 results) ?No results for input(s): PROBNP in the last 8760 hours. ?HbA1C: ?No results for input(s): HGBA1C in the last 72 hours. ?CBG: ?No results for input(s): GLUCAP in the last 168 hours. ?Lipid Profile: ?No results for input(s): CHOL, HDL, LDLCALC, TRIG, CHOLHDL, LDLDIRECT in the last 72 hours. ?Thyroid Function Tests: ?Recent Labs  ?  02/03/22 ?0112  ?TSH 0.258*  ? ?Anemia Panel: ?No results for input(s): VITAMINB12, FOLATE, FERRITIN, TIBC, IRON, RETICCTPCT in the last 72 hours. ?Sepsis Labs: ?Recent Labs  ?Lab 02/02/22 ?2252 02/03/22 ?8099  ?LATICACIDVEN 1.1 1.2  ? ? ?Recent Results (from the past 240 hour(s))  ?Resp Panel by RT-PCR (Flu A&B, Covid) Nasopharyngeal Swab     Status: None  ? Collection Time: 02/02/22 11:09 PM  ? Specimen: Nasopharyngeal Swab; Nasopharyngeal(NP) swabs in vial transport medium  ?Result Value Ref Range Status  ? SARS Coronavirus 2 by RT PCR NEGATIVE NEGATIVE Final  ?  Comment: (NOTE) ?SARS-CoV-2 target nucleic acids are NOT DETECTED. ? ?The SARS-CoV-2 RNA is generally detectable in upper respiratory ?specimens during the acute phase of infection. The lowest ?concentration of SARS-CoV-2 viral copies this assay can detect is ?138 copies/mL. A  negative result does not preclude SARS-Cov-2 ?infection and should not be used as the sole basis for treatment or ?other patient management decisions. A negative result may occur with  ?improper specimen collection/handling, submission of specimen other ?than nasopharyngeal swab, presence of viral mutation(s) within the ?areas targeted by this assay, and inadequate number of viral ?copies(<138 copies/mL). A negative result must be combined with ?clinical observations, patient history, and epidemiological ?information. The expected result is Negative. ? ?Fact Sheet for Patients:  ?EntrepreneurPulse.com.au ? ?Fact Sheet for Healthcare Providers:  ?IncredibleEmployment.be ? ?This test is no t yet approved or cleared by the [place] FDA and  ?has been authorized for detection and/or diagnosis of SARS-CoV-2 by ?FDA under an Emergency Use Authorization (EUA). This EUA will remain  ?in effect (meaning this test can be used) for the duration of the ?COVID-19 declaration under Section 564(b)(1) of the Act, 21 ?U.S.C.section 360bbb-3(b)(1), unless the authorization is terminated  ?or revoked sooner.  ? ? ?  ?  Influenza A by PCR NEGATIVE NEGATIVE Final  ? Influenza B by PCR NEGATIVE NEGATIVE Final  ?  Comment: (NOTE) ?The Xpert Xpress SARS-CoV-2/FLU/RSV plus assay is intended as an aid ?in the diagnosis of influenza from Nasopharyngeal swab specimens and ?should not be used as a sole basis for treatment. Nasal washings and ?aspirates are unacceptable for Xpert Xpress SARS-CoV-2/FLU/RSV ?testing. ? ?Fact Sheet for Patients: ?EntrepreneurPulse.com.au ? ?Fact Sheet for Healthcare Providers: ?IncredibleEmployment.be ? ?This test is not yet approved or cleared by the [place] FDA and ?has been authorized for detection and/or diagnosis of SARS-CoV-2 by ?FDA under an Emergency Use Authorization (EUA). This EUA will remain ?in effect (meaning this test can  be used) for the duration of the ?COVID-19 declaration under Section 564(b)(1) of the Act, 21 U.S.C. ?section 360bbb-3(b)(1), unless the authorization is terminated or ?revoked. ? ?Performed at [facility]

## 2022-02-05 DIAGNOSIS — J9601 Acute respiratory failure with hypoxia: Secondary | ICD-10-CM | POA: Diagnosis not present

## 2022-02-05 LAB — CBC
HCT: 42 % (ref 36.0–46.0)
Hemoglobin: 14 g/dL (ref 12.0–15.0)
MCH: 28.9 pg (ref 26.0–34.0)
MCHC: 33.3 g/dL (ref 30.0–36.0)
MCV: 86.8 fL (ref 80.0–100.0)
Platelets: 285 10*3/uL (ref 150–400)
RBC: 4.84 MIL/uL (ref 3.87–5.11)
RDW: 14.6 % (ref 11.5–15.5)
WBC: 11.9 10*3/uL — ABNORMAL HIGH (ref 4.0–10.5)
nRBC: 0 % (ref 0.0–0.2)

## 2022-02-05 LAB — COMPREHENSIVE METABOLIC PANEL
ALT: 18 U/L (ref 0–44)
AST: 13 U/L — ABNORMAL LOW (ref 15–41)
Albumin: 4 g/dL (ref 3.5–5.0)
Alkaline Phosphatase: 71 U/L (ref 38–126)
Anion gap: 8 (ref 5–15)
BUN: 15 mg/dL (ref 6–20)
CO2: 24 mmol/L (ref 22–32)
Calcium: 8.9 mg/dL (ref 8.9–10.3)
Chloride: 104 mmol/L (ref 98–111)
Creatinine, Ser: 0.75 mg/dL (ref 0.44–1.00)
GFR, Estimated: >60 mL/min (ref >60)
Glucose, Bld: 131 mg/dL — ABNORMAL HIGH (ref 70–99)
Potassium: 4.3 mmol/L (ref 3.5–5.1)
Sodium: 136 mmol/L (ref 135–145)
Total Bilirubin: 0.4 mg/dL (ref 0.3–1.2)
Total Protein: 7.7 g/dL (ref 6.5–8.1)

## 2022-02-05 MED ORDER — GUAIFENESIN ER 600 MG PO TB12
1200.0000 mg | ORAL_TABLET | Freq: Two times a day (BID) | ORAL | Status: DC
  Administered 2022-02-05 – 2022-02-06 (×2): 1200 mg via ORAL
  Filled 2022-02-05 (×2): qty 2

## 2022-02-05 NOTE — Progress Notes (Signed)
?PROGRESS NOTE ? ? ? ?Jeanne Evans  NTZ:[id number] DOB: 12/28/1985 DOA: 02/02/2022 ?PCP: [name], MD  ?Brief Narrative: 36 year old female with history of sarcoidosis and asthma and breast cancer on immunotherapy admitted with dyspnea on exertion and shortness of breath.  She reports her cough is worse and she is unable to ambulate even to the restroom without feeling short of breath which is unusual for her.  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  CT angiogram of the chest negative for PE but concerning for reactive airway disease and bronchitis.  Lung nodules and hilar adenopathy possibly secondary to sarcoidosis.  COVID-negative. ? ?Assessment & Plan: ?  ?Principal Problem: ?  Acute respiratory failure with hypoxemia ([place]) ?Active Problems: ?  Malignant neoplasm of upper-outer quadrant of left breast in female, estrogen receptor negative ([place]) ?  Acute respiratory failure with hypoxia ([place]) ? ? ?#1 asthma exacerbation in the setting of sarcoidosis and history of breast cancer on immunotherapy.  CT chest shows no evidence of pneumonitis or pulmonary embolism.  But it is concerning for reactive airway disease and bronchitis. ?She has been intermittently tachypneic and tachycardic and is not back to her baseline.  On 2 L of oxygen saturating 94%. ?She has been treated with steroids nebulizers Pulmicort, albuterol and Singulair. ? discussed with PCCM. ?We will continue IV steroids for now increase the dose to twice a day Solu-Medrol 40 mg IV. ?She reports she is bringing up thick yellow phlegm.  We will add Z-Pak to possibly cover bronchitis. ?Echocardiogram September 2022 with ejection fraction 65 to 70%.  Normal left ventricular function.  No regional wall motion abnormality. ? ?#2 history of sarcoidosis she has appointment to follow-up with Dr. Chase Caller this month. ? ?#3 history of breast cancer followed by Dr. Charlena Cross ? discussed with her. ? ?#4  hypertension is stable ? ? ? ?Estimated body mass index is 30.89 kg/m? as calculated from the following: ?  Height as of this encounter: '5\' 3"'$  (1.6 m). ?  Weight as of this encounter: 79.1 kg. ? ?DVT prophylaxis: Lovenox ?Code Status: full code ?Family Communication: None at bedside ?Disposition Plan:  Status is: Inpatient hopefully home 02/06/2022 if remains stable ?Remains inpatient appropriate because: Short of breath with minimal exertion ?  ?Consultants:  ?None ? ?Procedures: None ?Antimicrobials: Z-Pak ? ?Subjective: ?Feels a little better than yesterday however still does not feel back to her baseline still sounds wheezy bringing up phlegm and cough and dyspnea on exertion ?Objective: ?Vitals:  ? 02/04/22 1420 02/04/22 2052 02/05/22 0514 02/05/22 0735  ?BP:  140/89 (!) 140/91   ?Pulse:  95 66   ?Resp: '17 17 16   '$ ?Temp:  98.6 ?F (37 ?C) 97.8 ?F (36.6 ?C)   ?TempSrc:  Oral Oral   ?SpO2: 96% 97% 93% 95%  ?Weight:      ?Height:      ? ? ?Intake/Output Summary (Last 24 hours) at 02/05/2022 0942 ?Last data filed at 02/04/2022 1500 ?Gross per 24 hour  ?Intake 250 ml  ?Output --  ?Net 250 ml  ? ? ?Filed Weights  ? 02/03/22 0405  ?Weight: 79.1 kg  ? ? ?Examination: ? ?General exam: Appears in mild distress due to sob ?Respiratory system: Coarse and wheezing b/l to auscultation. Respiratory effort normal. ?Cardiovascular system: S1 & S2 heard, RRR. No JVD, murmurs, rubs, gallops or clicks. No pedal edema. ?Gastrointestinal system: Abdomen is nondistended, soft and nontender. No organomegaly or  masses felt. Normal bowel sounds heard. ?Central nervous system: Alert and oriented. No focal neurological deficits. ?Extremities:  no edema  ?Skin: No rashes, lesions or ulcers ?Psychiatry: Judgement and insight appear normal. Mood & affect appropriate.  ? ? ? ?Data Reviewed: I have personally reviewed following labs and imaging studies ? ?CBC: ?Recent Labs  ?Lab 02/02/22 ?2252 02/03/22 ?0110 02/05/22 ?8921  ?WBC 8.8 8.7 11.9*   ?NEUTROABS 7.5  --   --   ?HGB 14.1 13.7 14.0  ?HCT 41.2 41.3 42.0  ?MCV 85.7 86.2 86.8  ?PLT 275 277 285  ? ? ?Basic Metabolic Panel: ?Recent Labs  ?Lab 02/02/22 ?2019 02/03/22 ?0110 02/05/22 ?0734  ?NA 135  --  136  ?K 3.8  --  4.3  ?CL 103  --  104  ?CO2 22  --  24  ?GLUCOSE 104*  --  131*  ?BUN 6  --  15  ?CREATININE 0.73 0.80 0.75  ?CALCIUM 9.0  --  8.9  ? ? ?GFR: ?Estimated Creatinine Clearance: 95.9 mL/min (by C-G formula based on SCr of 0.75 mg/dL). ?Liver Function Tests: ?Recent Labs  ?Lab 02/02/22 ?2252 02/05/22 ?1941  ?AST 20 13*  ?ALT 18 18  ?ALKPHOS 90 71  ?BILITOT 1.0 0.4  ?PROT 8.7* 7.7  ?ALBUMIN 4.6 4.0  ? ? ?No results for input(s): LIPASE, AMYLASE in the last 168 hours. ?No results for input(s): AMMONIA in the last 168 hours. ?Coagulation Profile: ?Recent Labs  ?Lab 02/03/22 ?0005  ?INR 1.0  ? ? ?Cardiac Enzymes: ?No results for input(s): CKTOTAL, CKMB, CKMBINDEX, TROPONINI in the last 168 hours. ?BNP (last 3 results) ?No results for input(s): PROBNP in the last 8760 hours. ?HbA1C: ?No results for input(s): HGBA1C in the last 72 hours. ?CBG: ?No results for input(s): GLUCAP in the last 168 hours. ?Lipid Profile: ?No results for input(s): CHOL, HDL, LDLCALC, TRIG, CHOLHDL, LDLDIRECT in the last 72 hours. ?Thyroid Function Tests: ?Recent Labs  ?  02/03/22 ?0112  ?TSH 0.258*  ? ? ?Anemia Panel: ?No results for input(s): VITAMINB12, FOLATE, FERRITIN, TIBC, IRON, RETICCTPCT in the last 72 hours. ?Sepsis Labs: ?Recent Labs  ?Lab 02/02/22 ?2252 02/03/22 ?7408  ?LATICACIDVEN 1.1 1.2  ? ? ? ?Recent Results (from the past 240 hour(s))  ?Resp Panel by RT-PCR (Flu A&B, Covid) Nasopharyngeal Swab     Status: None  ? Collection Time: 02/02/22 11:09 PM  ? Specimen: Nasopharyngeal Swab; Nasopharyngeal(NP) swabs in vial transport medium  ?Result Value Ref Range Status  ? SARS Coronavirus 2 by RT PCR NEGATIVE NEGATIVE Final  ?  Comment: (NOTE) ?SARS-CoV-2 target nucleic acids are NOT DETECTED. ? ?The SARS-CoV-2 RNA  is generally detectable in upper respiratory ?specimens during the acute phase of infection. The lowest ?concentration of SARS-CoV-2 viral copies this assay can detect is ?138 copies/mL. A negative result does not preclude SARS-Cov-2 ?infection and should not be used as the sole basis for treatment or ?other patient management decisions. A negative result may occur with  ?improper specimen collection/handling, submission of specimen other ?than nasopharyngeal swab, presence of viral mutation(s) within the ?areas targeted by this assay, and inadequate number of viral ?copies(<138 copies/mL). A negative result must be combined with ?clinical observations, patient history, and epidemiological ?information. The expected result is Negative. ? ?Fact Sheet for Patients:  ?EntrepreneurPulse.com.au ? ?Fact Sheet for Healthcare Providers:  ?IncredibleEmployment.be ? ?This test is no t yet approved or cleared by the [place] FDA and  ?has been authorized for detection and/or diagnosis of SARS-CoV-2  by ?FDA under an Emergency Use Authorization (EUA). This EUA will remain  ?in effect (meaning this test can be used) for the duration of the ?COVID-19 declaration under Section 564(b)(1) of the Act, 21 ?U.S.C.section 360bbb-3(b)(1), unless the authorization is terminated  ?or revoked sooner.  ? ? ?  ? Influenza A by PCR NEGATIVE NEGATIVE Final  ? Influenza B by PCR NEGATIVE NEGATIVE Final  ?  Comment: (NOTE) ?The Xpert Xpress SARS-CoV-2/FLU/RSV plus assay is intended as an aid ?in the diagnosis of influenza from Nasopharyngeal swab specimens and ?should not be used as a sole basis for treatment. Nasal washings and ?aspirates are unacceptable for Xpert Xpress SARS-CoV-2/FLU/RSV ?testing. ? ?Fact Sheet for Patients: ?EntrepreneurPulse.com.au ? ?Fact Sheet for Healthcare Providers: ?IncredibleEmployment.be ? ?This test is not yet approved or cleared by the  [place] FDA and ?has been authorized for detection and/or diagnosis of SARS-CoV-2 by ?FDA under an Emergency Use Authorization (EUA). This EUA will remain ?in effect (meaning this test can be used) for th

## 2022-02-06 DIAGNOSIS — J9601 Acute respiratory failure with hypoxia: Secondary | ICD-10-CM | POA: Diagnosis not present

## 2022-02-06 MED ORDER — ALBUTEROL SULFATE (2.5 MG/3ML) 0.083% IN NEBU: 2.5000 mg | INHALATION_SOLUTION | Freq: Two times a day (BID) | RESPIRATORY_TRACT | Status: DC

## 2022-02-06 MED ORDER — BENZONATATE 200 MG PO CAPS: 200.0000 mg | ORAL_CAPSULE | Freq: Three times a day (TID) | ORAL | 0 refills | Status: DC | PRN

## 2022-02-06 MED ORDER — AZITHROMYCIN 250 MG PO TABS: ORAL_TABLET | ORAL | 0 refills | Status: DC

## 2022-02-06 MED ORDER — PREDNISONE 10 MG PO TABS: ORAL_TABLET | ORAL | 0 refills | Status: DC

## 2022-02-06 NOTE — Progress Notes (Signed)
Pt discharged home with all belongings. Discharge education reviewed. No questions or concerns at this time. Rx and work excuse note sent with pt at time of discharge. Educated pt on importance of wearing appropriate PPE while working with harmful chemicals at work.  ?

## 2022-02-06 NOTE — Discharge Summary (Signed)
Physician Discharge Summary  ?Jeanne Evans TKZ:[id number] DOB: 1985/12/13 DOA: 02/02/2022 ? ?PCP: [name], MD ? ?Admit date: 02/02/2022 ?Discharge date: 02/06/2022 ? ?Admitted From: Home ?Disposition: Home ? ?Recommendations for Outpatient Follow-up:  ?Follow up with PCP in 1-2 weeks ?Please obtain BMP/CBC in one week ?Please follow up with Dr. Chase Caller as scheduled on April 17 ? ?Home Health: None ?Equipment/Devices: None ?Discharge Condition: Stable ?CODE STATUS: Full code ?Diet recommendation: Cardiac ? ?Brief/Interim Summary: ? 36 year old female with history of sarcoidosis and asthma and breast cancer on immunotherapy admitted with dyspnea on exertion and shortness of breath.  She reports her cough is worse and she is unable to ambulate even to the restroom without feeling short of breath which is unusual for her.  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  CT angiogram of the chest negative for PE but concerning for reactive airway disease and bronchitis.  Lung nodules and hilar adenopathy possibly secondary to sarcoidosis.  COVID-negative. ? ?Discharge Diagnoses:  ?Principal Problem: ?  Acute respiratory failure with hypoxemia ([facility]) ?Active Problems: ?  Malignant neoplasm of upper-outer quadrant of left breast in female, estrogen receptor negative ([place]) ?  Acute respiratory failure with hypoxia ([place]) ? ? ? #1 asthma exacerbation in the setting of sarcoidosis and history of breast cancer on immunotherapy.  CT chest shows no evidence of pneumonitis or pulmonary embolism.  But it is concerning for reactive airway disease and bronchitis. ?She was treated with IV steroids azithromycin and nebulizers.  She was discharged on 2 weeks tapered dose of prednisone.  Discussed with PCCM.  She has follow-up with Dr. Chase Caller on April 17.  She was able to maintain her oxygen saturation on room air prior to discharge. 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  Discussed with her to wear proper PPE while at work. ?She also reported that she does vaping which was discouraged. ?Echocardiogram September 2022 with ejection fraction 65 to 70%.  Normal left ventricular function.  No regional wall motion abnormality. ?  ?#2 history of sarcoidosis she has appointment to follow-up with Dr. Chase Caller this month. ?  ?#3 history of breast cancer followed by Dr. Charlena Cross ? discussed with her. ?  ?#4 hypertension is stable ? ?Estimated body mass index is 30.89 kg/m? as calculated from the following: ?  Height as of this encounter: '5\' 3"'$  (1.6 m). ?  Weight as of this encounter: 79.1 kg. ? ?Discharge Instructions ? ?Discharge Instructions   ? ? Diet - low sodium heart healthy   Complete by: As directed ?  ? Increase activity slowly   Complete by: As directed ?  ? ?  ? ?Allergies as of 02/06/2022   ? ?   Reactions  ? Sulfa Antibiotics Hives  ? ?  ? ?  ?Medication List  ?  ? ?STOP taking these medications   ? ?TYLENOL PO ?  ?valACYclovir 500 MG tablet ?Commonly known as: VALTREX ?  ?venlafaxine XR 75 MG 24 hr capsule ?Commonly known as: EFFEXOR-XR ?  ? ?  ? ?TAKE these medications   ? ?albuterol 108 (90 Base) MCG/ACT inhaler ?Commonly known as: VENTOLIN HFA ?Inhale 2 puffs into the lungs every 6 (six) hours as needed for wheezing or shortness of breath. ?  ?azithromycin 250 MG tablet ?Commonly known as: ZITHROMAX ?Take 1 tablet daily for 3 days ?  ?benzonatate 200 MG capsule ?Commonly known as: TESSALON ?  Take 1 capsule (200 mg total) by mouth 3 (three) times daily as needed for cough. ?  ?fluticasone furoate-vilanterol 100-25 MCG/INH Aepb ?Commonly known as: Breo Ellipta ?Inhale 1 puff into the lungs daily. ?  ?gabapentin 300 MG capsule ?Commonly known as: NEURONTIN ?Take 1 capsule (300 mg total) by mouth at bedtime. ?What changed:  ?when to take this ?reasons to take this ?  ?ibuprofen 800 MG tablet ?Commonly known as: ADVIL ?Take 1 tablet  (800 mg total) by mouth every 8 (eight) hours as needed. ?  ?lidocaine-prilocaine cream ?Commonly known as: EMLA ?Apply 1 application. topically as needed (for port access). ?  ?montelukast 10 MG tablet ?Commonly known as: SINGULAIR ?Take 1 tablet (10 mg total) by mouth at bedtime. ?  ?omeprazole 40 MG capsule ?Commonly known as: PRILOSEC ?TAKE 1 CAPSULE(40 MG) BY MOUTH AT BEDTIME ?What changed: See the new instructions. ?  ?ondansetron 4 MG disintegrating tablet ?Commonly known as: ZOFRAN-ODT ?Take 1 tablet (4 mg total) by mouth every 8 (eight) hours as needed for nausea or vomiting. ?  ?predniSONE 10 MG tablet ?Commonly known as: DELTASONE ?Take 4 tablets daily for 3 days ?Then 3 tablets daily for 3 days ?Then 2 tablets daily for 3 days ?Then 1 tablet daily till all tablets are done. ?  ? ?  ? ? Follow-up Information   ? ? [name], MD Follow up.   ?Specialty: Obstetrics and Gynecology ?Contact information: ?[address] ?[address] ?[place] [address] ?[phone] ? ? ?  ?  ? ? Brand Males, MD Follow up.   ?Specialty: Pulmonary Disease ?Contact information: ?[address] ?[address] ?[place] [address] ?[phone] ? ? ?  ?  ? ?  ?  ? ?  ? ?Allergies  ?Allergen Reactions  ? Sulfa Antibiotics Hives  ? ? ?Consultations: ?Discussed with PCCM on the phone ?Discussed with oncologist on the phone ? ? ?Procedures/Studies: ?DG Chest 2 View ? ?Result Date: 02/02/2022 ?CLINICAL DATA:  Shortness of breath and cough. EXAM: CHEST - 2 VIEW COMPARISON:  Radiograph 04/30/2021, CT 05/29/2021 FINDINGS: Right chest port tip in the SVC.The cardiomediastinal contours are normal. Bronchial thickening. Pulmonary vasculature is normal. No consolidation, pleural effusion, or pneumothorax. No acute osseous abnormalities are seen. Left breast and axillary surgical clips. IMPRESSION: Bronchial thickening suggesting bronchitis or asthma. Electronically Signed   By: Keith Rake M.D.   On: 02/02/2022 20:58  ? ?CT Angio  Chest PE W and/or Wo Contrast ? ?Result Date: 02/02/2022 ?CLINICAL DATA:  Pulmonary embolism (PE) suspected, unknown D-dimer Cough and congestion.  Active treatment for breast cancer. EXAM: CT ANGIOGRAPHY CHEST WITH CONTRAST TECHNIQUE: Multidetector CT imaging of the chest was performed using the standard protocol during bolus administration of intravenous contrast. Multiplanar CT image reconstructions and MIPs were obtained to evaluate the vascular anatomy. RADIATION DOSE REDUCTION: This exam was performed according to the departmental dose-optimization program which includes automated exposure control, adjustment of the mA and/or kV according to patient size and/or use of iterative reconstruction technique. CONTRAST:  17m OMNIPAQUE IOHEXOL 350 MG/ML SOLN COMPARISON:  Radiograph earlier today.  Chest CT 05/29/2021 FINDINGS: Cardiovascular: There is mild motion artifact through the mid and lower lung zones that limits detailed assessment. Allowing for this, there are no filling defects within the pulmonary arteries to suggest pulmonary embolus. The thoracic aorta is normal in caliber. No aortic dissection. The heart is normal in size. No pericardial effusion. Right chest port with tip in the SVC. Mediastinum/Nodes: Enlarged right hilar  lymph nodes, including a 14 mm node series 4 image 57, unchanged from prior exam. Additional low right infrahilar nodes are also not significantly changed. Shotty left hilar lymph nodes are not significantly changed. Small mediastinal lymph nodes are not enlarged by size criteria, also unchanged. There is no new or progressive adenopathy. No internal mammary adenopathy. No axillary adenopathy. Surgical clips in the left axilla. No thyroid nodule. No esophageal wall thickening. Lungs/Pleura: Improvement in the upper lobe predominant nodularity from prior exam with tiny residual micro nodules. The right perihilar perifissural smoothly marginated 2.1 x 0.7 cm nodule series 10, image 75,  is smaller than on prior exam. No new or progressive nodularity. There is no acute or focal airspace disease. Bronchial wall thickening most prominent in the lower lobes. No pleural fluid. No findings of pulmo

## 2022-02-08 LAB — CULTURE, BLOOD (ROUTINE X 2)
Culture: NO GROWTH
Culture: NO GROWTH

## 2022-02-17 ENCOUNTER — Ambulatory Visit: Payer: 59

## 2022-02-17 ENCOUNTER — Other Ambulatory Visit: Payer: 59

## 2022-02-17 ENCOUNTER — Other Ambulatory Visit: Payer: Self-pay | Admitting: *Deleted

## 2022-02-17 DIAGNOSIS — R053 Chronic cough: Secondary | ICD-10-CM

## 2022-02-18 ENCOUNTER — Encounter: Payer: Self-pay | Admitting: Hematology and Oncology

## 2022-02-18 ENCOUNTER — Other Ambulatory Visit: Payer: Self-pay

## 2022-02-18 ENCOUNTER — Inpatient Hospital Stay: Payer: 59

## 2022-02-18 ENCOUNTER — Inpatient Hospital Stay: Payer: 59 | Attending: Oncology | Admitting: Adult Health

## 2022-02-18 ENCOUNTER — Encounter: Payer: Self-pay | Admitting: Adult Health

## 2022-02-18 VITALS — BP 138/88 | HR 75 | Temp 97.5°F | Resp 16 | Wt 186.0 lb

## 2022-02-18 DIAGNOSIS — Z95828 Presence of other vascular implants and grafts: Secondary | ICD-10-CM

## 2022-02-18 DIAGNOSIS — K2100 Gastro-esophageal reflux disease with esophagitis, without bleeding: Secondary | ICD-10-CM

## 2022-02-18 DIAGNOSIS — Z171 Estrogen receptor negative status [ER-]: Secondary | ICD-10-CM

## 2022-02-18 DIAGNOSIS — D869 Sarcoidosis, unspecified: Secondary | ICD-10-CM | POA: Insufficient documentation

## 2022-02-18 DIAGNOSIS — Z8042 Family history of malignant neoplasm of prostate: Secondary | ICD-10-CM | POA: Diagnosis not present

## 2022-02-18 DIAGNOSIS — Z801 Family history of malignant neoplasm of trachea, bronchus and lung: Secondary | ICD-10-CM | POA: Diagnosis not present

## 2022-02-18 DIAGNOSIS — Z5112 Encounter for antineoplastic immunotherapy: Secondary | ICD-10-CM | POA: Diagnosis not present

## 2022-02-18 DIAGNOSIS — Z79899 Other long term (current) drug therapy: Secondary | ICD-10-CM | POA: Diagnosis not present

## 2022-02-18 DIAGNOSIS — R059 Cough, unspecified: Secondary | ICD-10-CM | POA: Diagnosis not present

## 2022-02-18 DIAGNOSIS — B370 Candidal stomatitis: Secondary | ICD-10-CM | POA: Insufficient documentation

## 2022-02-18 DIAGNOSIS — Z8041 Family history of malignant neoplasm of ovary: Secondary | ICD-10-CM | POA: Insufficient documentation

## 2022-02-18 DIAGNOSIS — C50412 Malignant neoplasm of upper-outer quadrant of left female breast: Secondary | ICD-10-CM | POA: Insufficient documentation

## 2022-02-18 LAB — CBC WITH DIFFERENTIAL (CANCER CENTER ONLY)
Abs Immature Granulocytes: 0.01 10*3/uL (ref 0.00–0.07)
Basophils Absolute: 0 10*3/uL (ref 0.0–0.1)
Basophils Relative: 0 %
Eosinophils Absolute: 0 10*3/uL (ref 0.0–0.5)
Eosinophils Relative: 0 %
HCT: 33.5 % — ABNORMAL LOW (ref 36.0–46.0)
Hemoglobin: 11.2 g/dL — ABNORMAL LOW (ref 12.0–15.0)
Immature Granulocytes: 0 %
Lymphocytes Relative: 25 %
Lymphs Abs: 1.8 10*3/uL (ref 0.7–4.0)
MCH: 28.4 pg (ref 26.0–34.0)
MCHC: 33.4 g/dL (ref 30.0–36.0)
MCV: 84.8 fL (ref 80.0–100.0)
Monocytes Absolute: 0.7 10*3/uL (ref 0.1–1.0)
Monocytes Relative: 9 %
Neutro Abs: 4.7 10*3/uL (ref 1.7–7.7)
Neutrophils Relative %: 66 %
Platelet Count: 247 10*3/uL (ref 150–400)
RBC: 3.95 MIL/uL (ref 3.87–5.11)
RDW: 15.9 % — ABNORMAL HIGH (ref 11.5–15.5)
WBC Count: 7.2 10*3/uL (ref 4.0–10.5)
nRBC: 0 % (ref 0.0–0.2)

## 2022-02-18 LAB — COMPREHENSIVE METABOLIC PANEL
ALT: 24 U/L (ref 0–44)
AST: 11 U/L — ABNORMAL LOW (ref 15–41)
Albumin: 3.8 g/dL (ref 3.5–5.0)
Alkaline Phosphatase: 68 U/L (ref 38–126)
Anion gap: 6 (ref 5–15)
BUN: 20 mg/dL (ref 6–20)
CO2: 28 mmol/L (ref 22–32)
Calcium: 8.8 mg/dL — ABNORMAL LOW (ref 8.9–10.3)
Chloride: 105 mmol/L (ref 98–111)
Creatinine, Ser: 0.76 mg/dL (ref 0.44–1.00)
GFR, Estimated: >60 mL/min (ref >60)
Glucose, Bld: 91 mg/dL (ref 70–99)
Potassium: 4.2 mmol/L (ref 3.5–5.1)
Sodium: 139 mmol/L (ref 135–145)
Total Bilirubin: 0.4 mg/dL (ref 0.3–1.2)
Total Protein: 6.7 g/dL (ref 6.5–8.1)

## 2022-02-18 LAB — TSH: TSH: 0.455 u[IU]/mL (ref 0.308–3.960)

## 2022-02-18 MED ORDER — SODIUM CHLORIDE 0.9% FLUSH
10.0000 mL | INTRAVENOUS | Status: DC | PRN
  Administered 2022-02-18: 10 mL

## 2022-02-18 MED ORDER — OMEPRAZOLE 40 MG PO CPDR: 40.0000 mg | DELAYED_RELEASE_CAPSULE | Freq: Every day | ORAL | 3 refills | Status: DC

## 2022-02-18 MED ORDER — SODIUM CHLORIDE 0.9 % IV SOLN: Freq: Once | INTRAVENOUS | Status: AC

## 2022-02-18 MED ORDER — SODIUM CHLORIDE 0.9% FLUSH
10.0000 mL | INTRAVENOUS | Status: DC | PRN
  Administered 2022-02-18: 10 mL via INTRAVENOUS

## 2022-02-18 MED ORDER — HEPARIN SOD (PORK) LOCK FLUSH 100 UNIT/ML IV SOLN
500.0000 [IU] | Freq: Once | INTRAVENOUS | Status: AC | PRN
  Administered 2022-02-18: 500 [IU]

## 2022-02-18 MED ORDER — SODIUM CHLORIDE 0.9 % IV SOLN
200.0000 mg | Freq: Once | INTRAVENOUS | Status: AC
  Administered 2022-02-18: 200 mg via INTRAVENOUS
  Filled 2022-02-18: qty 200

## 2022-02-18 MED ORDER — FLUCONAZOLE 150 MG PO TABS: 150.0000 mg | ORAL_TABLET | Freq: Every day | ORAL | 4 refills | Status: DC

## 2022-02-18 NOTE — Progress Notes (Signed)
[facility]: ?  ? ?Jeanne Kava, MD ?[address] ?[place] [address] ? ? ?DIAGNOSIS:  Cancer Staging  ?Malignant neoplasm of upper-outer quadrant of left breast in female, estrogen receptor negative ([place]) ?Staging form: Breast, AJCC 8th Edition ?- Clinical stage from 06/04/2020: Stage IIB (cT2, cN0(f), cM0, G3, ER-, PR-, HER2-) - Unsigned ?Stage prefix: Initial diagnosis ?Method of lymph node assessment: Core biopsy ?Histologic grading system: 3 grade system ? ? ?SUMMARY OF ONCOLOGIC HISTORY: ?36 y.o. [place] woman status post left breast upper outer quadrant biopsy 05/29/2020 for a clinical T2 N0, stage IIB invasive ductal carcinoma, functionally triple negative, with an MIB-1 of 95%. ?  ?(1) genetics testing 06/23/2020 through the [facility] Multi-Cancer Panel found no deleterious mutations in AIP, ALK, APC, ATM, AXIN2,BAP1,  BARD1, BLM, BMPR1A, BRCA1, BRCA2, BRIP1, CASR, CDC73, CDH1, CDK4, CDKN1B, CDKN1C, CDKN2A (p14ARF), CDKN2A (p16INK4a), CEBPA, CHEK2, CTNNA1, DICER1, DIS3L2, EGFR (c.2369C>T, p.Thr790Met variant only), EPCAM (Deletion/duplication testing only), FH, FLCN, GATA2, GPC3, GREM1 (Promoter region deletion/duplication testing only), HOXB13 (c.251G>A, p.Gly84Glu), HRAS, KIT, MAX, MEN1, MET, MITF (c.952G>A, p.Glu318Lys variant only), MLH1, MSH2, MSH3, MSH6, MUTYH, NBN, NF1, NF2, NTHL1, PALB2, PDGFRA, PHOX2B, PMS2, POLD1, POLE, POT1, PRKAR1A, PTCH1, PTEN, RAD50, RAD51C, RAD51D, RB1, RECQL4, RET, RNF43, RUNX1, SDHAF2, SDHA (sequence changes only), SDHB, SDHC, SDHD, SMAD4, SMARCA4, SMARCB1, SMARCE1, STK11, SUFU, TERC, TERT, TMEM127, TP53, TSC1, TSC2, VHL, WRN and WT1.  ?  ?(2) neoadjuvant chemotherapy consisting of cyclophosphamide and doxorubicin in dose dense fashion x4 started 06/19/2020, completed 08/05/2020, followed by paclitaxel and carboplatin weekly x12 starting 08/26/2020, completing 4 doses 09/23/2020 ?            (a) echocardiogram on 06/13/2020 showed  an EF of 60-65% ?            (b) paclitaxel and carboplatin discontinued after 4 doses because of neuropathy and cytopenias ?  ?(3) status post left lumpectomy and sentinel lymph node sampling 11/19/2020 for a residual yp T1c ypN0 invasive ductal carcinoma involving the superior margin ?            (a) additional surgery 12/09/2020 cleared the margin in question ?            (b) repeat prognostic panel confirms triple negative disease ?            (c) status post left mastopexy and right breast reduction on 10/27/2021 with fat grafting bilaterally ?  ?(4) adjuvant radiation : 01/22/2021 through 03/10/2021 ?Site Technique Total Dose (Gy) Dose per Fx (Gy) Completed Fx Beam Energies  ?Breast, Left: Breast_Lt 3D 50.4/50.4 1.8 28/28 6X  ?Breast, Left: Breast_Lt_Bst 3D 10/10 2 5/5 6X  ?            (a) received capecitabine sensitization, started 01/26/2021 ?  ?(5) pembrolizumab/Keytruda began on 04/16/2021, held 05/11/2021 (after 2 doses) with persistent cough, later attributed to asthma ?(A) pembrolizumab resumed 08/06/2021 ?(B) PD-L1 combined score is 10 ?  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  There were no mutations in BRCA1 or 2, ERBB2 or PIK3CA.  The microsatellite status was equivocal and the tumor mutational burden could not be determined on the sample. ?  ?(7) sarcoidosis? (followed by pulmonary: Ramaswamy) ?            (A) chest CT 06/13/2020 shows in addition to the left breast mass multiple ill-defined pulmonary nodules, pleural nodularity and symmetrical by hilar and mediastinal adenopathy consistent with sarcoidosis. ?(B)  chest CT 05/29/2021 shows stable mediastinal and hilar adenopathy as well as improved liver lesions.  No evidence of metastatic disease. ?(C) CT angiogram of the chest on February 02, 2022 shows no evidence of pulmonary embolus and improvement in possible sarcoidosis.  No progressive or new nodularity.  Bronchial wall thickening in the lower lobes  which can be seen with bronchitis or reactive airway disease. ? ?  ? ? ?CURRENT THERAPY: Pembrolizumab ? ?INTERVAL HISTORY: ?Jeanne Evans 36 y.o. female returns for evaluation prior to receiving her pembrolizumab.  She receives this every 3 weeks.  Please note that this was held between July and September due to questionable cough related to a side effect of the pembrolizumab, however she has been worked up fully by Dr. Chase Caller who has ruled this out. ? ?She was recently admitted to the hospital with asthma exacerbation in the setting of sarcoidosis.  I spoke with Dr. Chase Caller who does not think this is related to the pembrolizumab and states it is due to the asthma and sarcoid.  She is set to see him April 27.  When she sees him she would like to ask for an allergy visit. ? ?Shuvon he would like to have a work note sent for 8-hour days with limited lifting pulling pushing.  She also notes that she has a carpet like taste in her mouth and in her throat.  She also says that she wonders if there is a ENT related issue going on related to her cough.  She notes the Singulair has been much help since taking that in the morning.  She also requests a refill on her omeprazole. ? ?Her most recent mammogram was in June 2022 and showed no evidence of malignancy and breast density category B. ? ?Patient Active Problem List  ? Diagnosis Date Noted  ? Acute respiratory failure with hypoxemia ([place]) 02/03/2022  ? Acute respiratory failure with hypoxia ([place]) 02/03/2022  ? Asthma 07/31/2021  ? Lymphedema of left arm 02/02/2021  ? Drug-induced neutropenia ([place]) 09/30/2020  ? Genetic testing 06/26/2020  ? Family history of ovarian cancer   ? Family history of prostate cancer   ? Malignant neoplasm of upper-outer quadrant of left breast in female, estrogen receptor negative ([place]) 06/03/2020  ? ? ?is allergic to sulfa antibiotics. ? ?MEDICAL HISTORY: ?Past Medical History:  ?Diagnosis Date  ? Asthma 07/31/2021  ? Breast cancer  ([place])   ? Family history of ovarian cancer   ? Family history of prostate cancer   ? GERD (gastroesophageal reflux disease)   ? History of asthma   ? has albuterol, uses PRN  ? History of heart murmur in childhood   ? History of multiple allergies   ? ? ?SURGICAL HISTORY: ?Past Surgical History:  ?Procedure Laterality Date  ? BREAST LUMPECTOMY WITH RADIOACTIVE SEED AND SENTINEL LYMPH NODE BIOPSY Left 11/19/2020  ? Procedure: LEFT BREAST LUMPECTOMY X 2 WITH RADIOACTIVE SEED AND SENTINEL LYMPH NODE MAPPING;  Surgeon: Erroll Luna, MD;  Location: [facility];  Service: General;  Laterality: Left;  PECTORAL BLOCK  ? BREAST REDUCTION SURGERY Right 10/27/2021  ? Procedure: MAMMARY REDUCTION  TO RIGHT BREAST;  Surgeon: [name], MD;  Location: [facility];  Service: Plastics;  Laterality: Right;  ? LIPOSUCTION WITH LIPOFILLING Bilateral 10/27/2021  ? Procedure: BILATERAL FAT GRAFTING TO BREAST;  Surgeon: [name], MD;  Location: [facility];  Service: Plastics;  Laterality: Bilateral;  ? PORTACATH PLACEMENT Right 06/18/2020  ?  Procedure: INSERTION PORT-A-CATH WITH ULTRASOUND GUIDANCE;  Surgeon: Erroll Luna, MD;  Location: [facility];  Service: General;  Laterality: Right;  ? RE-EXCISION OF BREAST LUMPECTOMY Left 12/09/2020  ? Procedure: RE-EXCISION OF LEFT BREAST LUMPECTOMY;  Surgeon: Erroll Luna, MD;  Location: [facility];  Service: General;  Laterality: Left;  ? ? ?SOCIAL HISTORY: ?Social History  ? ?Socioeconomic History  ? Marital status: Single  ?  Spouse name: Not on file  ? Number of children: Not on file  ? Years of education: Not on file  ? Highest education level: Not on file  ?Occupational History  ? Not on file  ?Tobacco Use  ? Smoking status: Never  ? Smokeless tobacco: Never  ?Substance and Sexual Activity  ? Alcohol use: Yes  ?  Comment: occas  ? Drug use: Yes  ?  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 Sexual  activity: Not on file  ?Other Topics Concern  ? Not on file  ?Social History Narrative  ? Not on file  ? ?Social Determinants of Health  ? ?Financial Resource Strain: High Risk  ? Difficulty of Paying Living Expe

## 2022-02-18 NOTE — Assessment & Plan Note (Signed)
Sure Jeanne Evans is a 36 year old woman with 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 ? ?1.  Invasive ductal carcinoma: She will proceed with pembrolizumab today.  Her cough has been attributed to sarcoidosis and asthma.  This is consistent with previous findings.  Of note we have held pembrolizumab in the past and the cough and shortness of breath did not improve.  I ordered her mammogram to be completed in June, 2023 when it is due. ? ?2.  Sarcoidosis and cough: She is going to follow-up with Dr. Chase Caller on March 11, 2022.  She will also go and see ear nose and throat and my nurse is going to help get that appointment set up today.  I did recommend that she reduce her reflux related foods that are acidic.  I also refilled her omeprazole for her to take first thing in the morning on an empty stomach which will neutralize the acid in her stomach before she begins to eat throughout the day. ? ?3.  Thrush: I sent in [name] for her to take. ? ?4.  Work limitations: We adjusted a work note which can be located in the EMR to discuss her weight pushing pulling lifting restrictions in addition to work hours. ? ?We will see her back in 3 weeks for labs, follow-up, her next dose of Keytruda. ?

## 2022-03-04 ENCOUNTER — Encounter (HOSPITAL_COMMUNITY): Payer: Self-pay

## 2022-03-11 ENCOUNTER — Ambulatory Visit (INDEPENDENT_AMBULATORY_CARE_PROVIDER_SITE_OTHER): Payer: 59 | Admitting: Internal Medicine

## 2022-03-11 ENCOUNTER — Encounter: Payer: Self-pay | Admitting: Internal Medicine

## 2022-03-11 ENCOUNTER — Encounter: Payer: Self-pay | Admitting: Hematology and Oncology

## 2022-03-11 VITALS — BP 124/80 | HR 81 | Temp 98.1°F | Ht 63.0 in | Wt 190.4 lb

## 2022-03-11 DIAGNOSIS — D860 Sarcoidosis of lung: Secondary | ICD-10-CM | POA: Diagnosis not present

## 2022-03-11 DIAGNOSIS — Z95828 Presence of other vascular implants and grafts: Secondary | ICD-10-CM | POA: Insufficient documentation

## 2022-03-11 DIAGNOSIS — J4540 Moderate persistent asthma, uncomplicated: Secondary | ICD-10-CM

## 2022-03-11 DIAGNOSIS — F1211 Cannabis abuse, in remission: Secondary | ICD-10-CM

## 2022-03-11 DIAGNOSIS — D721 Eosinophilia, unspecified: Secondary | ICD-10-CM | POA: Diagnosis not present

## 2022-03-11 DIAGNOSIS — R768 Other specified abnormal immunological findings in serum: Secondary | ICD-10-CM

## 2022-03-11 MED ORDER — FLUTICASONE FUROATE-VILANTEROL 100-25 MCG/ACT IN AEPB: 1.0000 | INHALATION_SPRAY | Freq: Every day | RESPIRATORY_TRACT | 5 refills | Status: DC

## 2022-03-11 MED ORDER — PREDNISONE 10 MG PO TABS
ORAL_TABLET | ORAL | 0 refills | Status: AC
Start: 2022-03-11 — End: 2022-03-16

## 2022-03-11 MED ORDER — MONTELUKAST SODIUM 10 MG PO TABS: 10.0000 mg | ORAL_TABLET | Freq: Every day | ORAL | 5 refills | Status: DC

## 2022-03-11 NOTE — Patient Instructions (Addendum)
ICD-10-CM   ?1. Moderate persistent extrinsic asthma without complication  O67.12   ?  ?2. Eosinophilia, unspecified type  D72.10   ?  ?3. Elevated IgE level  R76.8   ?  ?4. Pulmonary sarcoidosis ([place])  D86.0   ?  ?5. Marijuana abuse in remission  F12.11   ?  ? ? ?Most recent asthma attack in March 2023 was probably related to weather change, running out of inhalers and possibly marijuana use ? ?Glad you are better all the last few days you you are in the early stages of developing an asthma flare ? ?Glad marijuana use is in remission ? ?You do have evidence of burned-out pulmonary sarcoidosis on CT scan of the chest  ? =-Your current primary problem is allergic asthma ? ? ? ?Plan ?- Please take prednisone 40 mg x1 day, then 30 mg x1 day, then 20 mg x1 day, then 10 mg x1 day, and then 5 mg x1 day and stop ? - tale this to ward off any flare up incipient ?-Refill Breo and Singulair and take it scheduled basis ?- Continue to use albuterol as needed ?- Stay away from marijuana ?-Refer to allergist for discussion and further evaluation of allergies and biologic therapy ? ?Follow-up ?- Return in 6 months or sooner if needed ?- -Do Fino test and also asthma control test ?

## 2022-03-11 NOTE — Addendum Note (Signed)
Addended by: Lorretta Harp on: 03/11/2022 03:23 PM ? ? Modules accepted: Orders ? ?

## 2022-03-11 NOTE — Progress Notes (Signed)
? ? ?36 y.o. [place] woman status post left breast upper outer quadrant biopsy 05/29/2020 for a clinical T2 N0, stage IIB invasive ductal carcinoma, functionally triple negative, with an MIB-1 of 95%. ?  ?(1) genetics testing 06/23/2020 through the [facility] Multi-Cancer Panel found no deleterious mutations in AIP, ALK, APC, ATM, AXIN2,BAP1,  BARD1, BLM, BMPR1A, BRCA1, BRCA2, BRIP1, CASR, CDC73, CDH1, CDK4, CDKN1B, CDKN1C, CDKN2A (p14ARF), CDKN2A (p16INK4a), CEBPA, CHEK2, CTNNA1, DICER1, DIS3L2, EGFR (c.2369C>T, p.Thr790Met variant only), EPCAM (Deletion/duplication testing only), FH, FLCN, GATA2, GPC3, GREM1 (Promoter region deletion/duplication testing only), HOXB13 (c.251G>A, p.Gly84Glu), HRAS, KIT, MAX, MEN1, MET, MITF (c.952G>A, p.Glu318Lys variant only), MLH1, MSH2, MSH3, MSH6, MUTYH, NBN, NF1, NF2, NTHL1, PALB2, PDGFRA, PHOX2B, PMS2, POLD1, POLE, POT1, PRKAR1A, PTCH1, PTEN, RAD50, RAD51C, RAD51D, RB1, RECQL4, RET, RNF43, RUNX1, SDHAF2, SDHA (sequence changes only), SDHB, SDHC, SDHD, SMAD4, SMARCA4, SMARCB1, SMARCE1, STK11, SUFU, TERC, TERT, TMEM127, TP53, TSC1, TSC2, VHL, WRN and WT1.  ?  ?(2) neoadjuvant chemotherapy consisting of cyclophosphamide and doxorubicin in dose dense fashion x4 started 06/19/2020, completed 08/05/2020, followed by paclitaxel and carboplatin weekly x12 starting 08/26/2020, completing 4 doses 09/23/2020 ?            (a) echocardiogram on 06/13/2020 showed an EF of 60-65% ?            (b) paclitaxel and carboplatin discontinued after 4 doses because of neuropathy and cytopenias ?  ?(3) status post left lumpectomy and sentinel lymph node sampling 11/19/2020 for a residual yp T1c ypN0 invasive ductal carcinoma involving the superior margin ?            (a) additional surgery 12/09/2020 cleared the margin in question ?            (b) repeat prognostic panel confirms triple negative disease ?  ?(4) adjuvant radiation : 01/22/2021 through 03/10/2021 ?Site Technique Total Dose (Gy) Dose per  Fx (Gy) Completed Fx Beam Energies  ?Breast, Left: Breast_Lt 3D 50.4/50.4 1.8 28/28 6X  ?Breast, Left: Breast_Lt_Bst 3D 10/10 2 5/5 6X  ?            (a) received capecitabine sensitization, started 01/26/2021 ?  ?(5) pembrolizumab/Keytruda began on 04/16/2021, discontinued 05/11/2021 (after 2 doses) with persistent cough ?  ?(6) molecular pathology from the 11/19/2020 sample requested 06/24/2021  ? ? ?OV 07/01/2021 - NEW CONSULT  from Dr Jana Hakim ? ?Subjective:  ?Patient ID: Jeanne Evans, female , DOB: January 16, 1986 , age 24 y.o. , MRN: [id number] , ADDRESS: [place] ?[place] [id number] ?PCP [name], MD ?Patient Care Team: ?[name], MD as PCP - General (Obstetrics and Gynecology) ?Rockwell Germany, RN as Oncology Nurse Navigator ?Mauro Kaufmann, RN as Oncology Nurse Navigator ?Erroll Luna, MD as Consulting Physician (General Surgery) ?Magrinat, Virgie Dad, MD as Consulting Physician (Oncology) ?Kyung Rudd, MD as Consulting Physician (Radiation Oncology) ?Servando Salina, MD as Consulting Physician (Obstetrics and Gynecology) ? ?This Provider for this visit: Treatment Team:  ?Attending Provider: Brand Males, MD ? ? ? ?07/01/2021 -   ?Chief Complaint  ?Patient presents with  ? Consult  ?  Pt states concerns of a cough from November last year came after chemo.   ? ? ? ?HPI ?Jeanne Evans 36 y.o. - African Ameriican female. Referred by Dr  Jana Hakim for chronic cough. Used to live In [place], [place] and relocated to [place] 6 years ago. Mom has asthma NOS.   She is non-smoker. Denies drug use.  Reports insidious onset of cough since Nov-Dec 2021 but  this was after dx of breast cancer and start of chemo. Denies ace inhibitor. Denies herceptin use. PReceded Keytruda. She got 2 doses of keytruda recently (as above) but [place] did not make her cough worse or better. Is currently on hold. Reports cough as severe and associatd with clear mucus. Loses sleep. Worse at night. Associated with  gaggin and chest hurt. Does lose sleep. Is on PPI/H2 blockae and cough some better iwh this. HAs had 2 rounds of oral steroids in summer 2022 but this helped. No steroids x 3 weeks. Cough associated with wheezing.  ? ?No personal hx of sarcoid, asthma,  ?Did have frequent bronchitis > 6 years ago when living in [place] ?Did have heart burn with XRT ?Did have seasonal allergies in [place] but not in [place] ? ? ?She had CT chest July 2022 - personally visualized. Sarcoid is beig raised but only thing I see is a reudced LUL nodular density compared to a year ago. Mild mediastinal adenopathy + and stable.  ? ?FeNO done today is elvated 0 94ppb ? ? ? ? ?CT Chest data 05/29/21 ? ?CLINICAL DATA:  Persistent cough for 8 months. Breast cancer ?staging. Chemotherapy and radiation therapy completed. ?  ?EXAM: ?CT CHEST WITH CONTRAST ?  ?TECHNIQUE: ?Multidetector CT imaging of the chest was performed during ?intravenous contrast administration. ?  ?CONTRAST:  4m OMNIPAQUE IOHEXOL 350 MG/ML SOLN ?  ?COMPARISON:  CT 06/13/2020 ?  ?FINDINGS: ?Cardiovascular: There are no significant vascular findings. Right IJ ?Port-A-Cath extends to the superior cavoatrial junction. The heart ?size is normal. There is no pericardial effusion. ?  ?Mediastinum/Nodes: Interval left breast surgery and left axillary ?node dissection. There are no enlarged axillary or internal mammary ?lymph nodes. There are stable mildly enlarged mediastinal and hilar ?lymph nodes, including a right hilar node measuring 1.4 cm on image ?52/2 and a subcarinal node measuring 1.1 cm on image 60/2. No ?progressive adenopathy identified.Small amount of residual thymic ?tissue in the anterior mediastinum. The thyroid gland, trachea and ?esophagus demonstrate no significant findings. ?  ?Lungs/Pleura: No pleural effusion or pneumothorax. The previously ?demonstrated widespread upper lobe-predominant pulmonary nodularity ?has significantly improved, and no enlarging  pulmonary nodules are ?identified. Unchanged well-circumscribed right ?infrahilar/perifissural nodule measuring 2.2 x 1.4 cm on image 67/7, ?probably a lymph node. ?  ?Upper abdomen: The liver is currently image prior to opacification ?of the hepatic veins, limiting its evaluation. However, the ?previously demonstrated low-density nodules throughout the liver are ?no longer seen. The spleen appears unremarkable. No adrenal mass. ?  ?Musculoskeletal/Chest wall: As above, interval postsurgical changes ?in the right breast with nonspecific skin thickening. No recurrent ?breast or axillary mass. No suspicious osseous findings. ?  ?IMPRESSION: ?1. Interval left breast surgery and axillary node dissection. ?2. The previously demonstrated widespread ill-defined pulmonary ?nodularity has improved. The mediastinal and hilar adenopathy has ?not significantly changed. The previously demonstrated hepatic ?lesions are not as well seen, but also appear improved/resolved. ?These findings remain most compatible with sarcoidosis; correlate ?clinically. ?3. No progression of findings suspicious for metastatic disease. ?  ?  ?Electronically Signed ?  By: WRichardean SaleM.D. ?  On: 06/01/2021 08:31 ?  ? ?No results found. ? ? ? ?PFT ? ?No flowsheet data found. ? ? ? ?07/31/2021- Interim hx  ?Patient presents today for telvisit/review PFTs. During her last visit with Dr. RChase Callerher cough was felt to be related to asthma with sarcoid in the differential diagnosis. She was started on BREO 100 one puff daily. She  was ordered for CBC with diff, RAST allergy panel.  ? ?Her symptoms have improved significantly. She still has mild cough and intermittent wheezing with exertion. PFTs today showed mild obstruction with positive bronchodilator response. Eosinophil absolute 300 and IgE 134. She has allergies to cedar tree and timothy grass. Denies chest pain or discomfort.  ? ?  ?Observations/Objective: ? ?- Able to speak in full sentences; no  overt shortness of breath or wheezing  ? ?- CT chest in July 2022 showed improvement in previously demonstrated widespread ill define pulmonary nodularity. 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 No progres

## 2022-03-12 ENCOUNTER — Inpatient Hospital Stay (HOSPITAL_BASED_OUTPATIENT_CLINIC_OR_DEPARTMENT_OTHER): Payer: 59 | Admitting: Hematology and Oncology

## 2022-03-12 ENCOUNTER — Inpatient Hospital Stay: Payer: 59

## 2022-03-12 ENCOUNTER — Other Ambulatory Visit: Payer: Self-pay

## 2022-03-12 ENCOUNTER — Encounter: Payer: Self-pay | Admitting: Hematology and Oncology

## 2022-03-12 ENCOUNTER — Other Ambulatory Visit: Payer: Self-pay | Admitting: Hematology and Oncology

## 2022-03-12 DIAGNOSIS — Z5112 Encounter for antineoplastic immunotherapy: Secondary | ICD-10-CM | POA: Diagnosis not present

## 2022-03-12 DIAGNOSIS — C50412 Malignant neoplasm of upper-outer quadrant of left female breast: Secondary | ICD-10-CM

## 2022-03-12 DIAGNOSIS — Z171 Estrogen receptor negative status [ER-]: Secondary | ICD-10-CM

## 2022-03-12 DIAGNOSIS — Z95828 Presence of other vascular implants and grafts: Secondary | ICD-10-CM

## 2022-03-12 LAB — COMPREHENSIVE METABOLIC PANEL
ALT: 15 U/L (ref 0–44)
AST: 14 U/L — ABNORMAL LOW (ref 15–41)
Albumin: 4.3 g/dL (ref 3.5–5.0)
Alkaline Phosphatase: 88 U/L (ref 38–126)
Anion gap: 5 (ref 5–15)
BUN: 15 mg/dL (ref 6–20)
CO2: 29 mmol/L (ref 22–32)
Calcium: 9.2 mg/dL (ref 8.9–10.3)
Chloride: 103 mmol/L (ref 98–111)
Creatinine, Ser: 0.9 mg/dL (ref 0.44–1.00)
GFR, Estimated: >60 mL/min (ref >60)
Glucose, Bld: 112 mg/dL — ABNORMAL HIGH (ref 70–99)
Potassium: 3.8 mmol/L (ref 3.5–5.1)
Sodium: 137 mmol/L (ref 135–145)
Total Bilirubin: 0.5 mg/dL (ref 0.3–1.2)
Total Protein: 7.6 g/dL (ref 6.5–8.1)

## 2022-03-12 LAB — CBC WITH DIFFERENTIAL (CANCER CENTER ONLY)
Abs Immature Granulocytes: 0.01 10*3/uL (ref 0.00–0.07)
Basophils Absolute: 0 10*3/uL (ref 0.0–0.1)
Basophils Relative: 1 %
Eosinophils Absolute: 0.2 10*3/uL (ref 0.0–0.5)
Eosinophils Relative: 3 %
HCT: 36.2 % (ref 36.0–46.0)
Hemoglobin: 11.9 g/dL — ABNORMAL LOW (ref 12.0–15.0)
Immature Granulocytes: 0 %
Lymphocytes Relative: 39 %
Lymphs Abs: 1.8 10*3/uL (ref 0.7–4.0)
MCH: 28.3 pg (ref 26.0–34.0)
MCHC: 32.9 g/dL (ref 30.0–36.0)
MCV: 86.2 fL (ref 80.0–100.0)
Monocytes Absolute: 0.4 10*3/uL (ref 0.1–1.0)
Monocytes Relative: 9 %
Neutro Abs: 2.3 10*3/uL (ref 1.7–7.7)
Neutrophils Relative %: 48 %
Platelet Count: 294 10*3/uL (ref 150–400)
RBC: 4.2 MIL/uL (ref 3.87–5.11)
RDW: 14.7 % (ref 11.5–15.5)
WBC Count: 4.8 10*3/uL (ref 4.0–10.5)
nRBC: 0 % (ref 0.0–0.2)

## 2022-03-12 LAB — TSH: TSH: 0.475 u[IU]/mL (ref 0.350–4.500)

## 2022-03-12 MED ORDER — HEPARIN SOD (PORK) LOCK FLUSH 100 UNIT/ML IV SOLN
500.0000 [IU] | Freq: Once | INTRAVENOUS | Status: AC | PRN
  Administered 2022-03-12: 500 [IU]

## 2022-03-12 MED ORDER — SODIUM CHLORIDE 0.9% FLUSH
10.0000 mL | INTRAVENOUS | Status: DC | PRN
  Administered 2022-03-12: 10 mL

## 2022-03-12 MED ORDER — SODIUM CHLORIDE 0.9% FLUSH
10.0000 mL | Freq: Once | INTRAVENOUS | Status: AC
  Administered 2022-03-12: 10 mL

## 2022-03-12 MED ORDER — ALTEPLASE 2 MG IJ SOLR
2.0000 mg | Freq: Once | INTRAMUSCULAR | Status: AC
  Administered 2022-03-12: 2 mg
  Filled 2022-03-12: qty 2

## 2022-03-12 MED ORDER — SODIUM CHLORIDE 0.9 % IV SOLN
200.0000 mg | Freq: Once | INTRAVENOUS | Status: AC
  Administered 2022-03-12: 200 mg via INTRAVENOUS
  Filled 2022-03-12: qty 200

## 2022-03-12 MED ORDER — SODIUM CHLORIDE 0.9 % IV SOLN: Freq: Once | INTRAVENOUS | Status: AC

## 2022-03-12 NOTE — Assessment & Plan Note (Signed)
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 ?She is tolerating Keytruda very well.  She was complaining of some ongoing cough and shortness of breath and hence was recommended to take 5-day course of prednisone.  She is not wheezing today.  She mentions to me that she will try and not take prednisone if possible and will use albuterol and Breo Ellipta.  She was however encouraged to pick up the prescription for prednisone since this is a weekend and if she starts feeling worse, she can certainly use it.  She understands that there is no good data to support using high-dose prednisone while on concomitant immunotherapy. ?She however understands that if there is an emergency then we would certainly be okay with that.  She will otherwise proceed with immunotherapy as scheduled today. ?Return to clinic in 3 weeks as scheduled ? ?2.  Patient reports episodic hypertension.  I encouraged her to make a log of her blood pressure every morning before she drinks her coffee and bring it back.  She was understandably nervous today given the difficult Port-A-Cath access which could have also raised her blood pressure. ?

## 2022-03-12 NOTE — Patient Instructions (Signed)
[facility] MEDICAL ONCOLOGY  Discharge Instructions: °Thank you for choosing [facility] to provide your oncology and hematology care.  ° °If you have a lab appointment with the Cancer Center, please go directly to the Cancer Center and check in at the registration area. °  °Wear comfortable clothing and clothing appropriate for easy access to any Portacath or PICC line.  ° °We strive to give you quality time with your provider. You may need to reschedule your appointment if you arrive late (15 or more minutes).  Arriving late affects you and other patients whose appointments are after yours.  Also, if you miss three or more appointments without notifying the office, you may be dismissed from the clinic at the provider’s discretion.    °  °For prescription refill requests, have your pharmacy contact our office and allow 72 hours for refills to be completed.   ° °Today you received the following chemotherapy and/or immunotherapy agent: Pembrolizumab (Keytruda) °  °To help prevent nausea and vomiting after your treatment, we encourage you to take your nausea medication as directed. ° °BELOW ARE SYMPTOMS THAT SHOULD BE REPORTED IMMEDIATELY: °*FEVER GREATER THAN 100.4 F (38 °C) OR HIGHER °*CHILLS OR SWEATING °*NAUSEA AND VOMITING THAT IS NOT CONTROLLED WITH YOUR NAUSEA MEDICATION °*UNUSUAL SHORTNESS OF BREATH °*UNUSUAL BRUISING OR BLEEDING °*URINARY PROBLEMS (pain or burning when urinating, or frequent urination) °*BOWEL PROBLEMS (unusual diarrhea, constipation, pain near the anus) °TENDERNESS IN MOUTH AND THROAT WITH OR WITHOUT PRESENCE OF ULCERS (sore throat, sores in mouth, or a toothache) °UNUSUAL RASH, SWELLING OR PAIN  °UNUSUAL VAGINAL DISCHARGE OR ITCHING  ° °Items with * indicate a potential emergency and should be followed up as soon as possible or go to the Emergency Department if any problems should occur. ° °Please show the CHEMOTHERAPY ALERT CARD or IMMUNOTHERAPY ALERT CARD at  check-in to the Emergency Department and triage nurse. ° °Should you have questions after your visit or need to cancel or reschedule your appointment, please contact [facility]ICAL [facility]COL[facility]  Dept: [phone]  and follow the prompts.  Office hours are 8:00 a.m. to 4:30 p.m. Monday - Friday. Please note that voicemails left after 4:00 p.m. may not be returned until the following business day.  We are closed weekends and major holidays. You have access to a nurse at all times for urgent questions. Please call the main number to the clinic Dept: [phone] and follow the prompts. ° ° °For any non-urgent questions, you may also contact your provider using MyChart. We now offer e-Visits for anyone 18 and older to request care online for non-urgent symptoms. For details visit [email or web address]. °  °Also download the MyChart app! Go to the app store, search "MyChart", open the app, select , and log in with your MyChart username and password. ° °Due to Covid, a mask is required upon entering the hospital/clinic. If you do not have a mask, one will be given to you upon arrival. For doctor visits, patients may have 1 support person aged 18 or older with them. For treatment visits, patients cannot have anyone with them due to current Covid guidelines and our immunocompromised population.  ° °

## 2022-03-12 NOTE — Progress Notes (Signed)
[facility]: ?  ? ?Jeanne Kava, MD ?[address] ?[place] [address] ? ? ?DIAGNOSIS:  Cancer Staging  ?Malignant neoplasm of upper-outer quadrant of left breast in female, estrogen receptor negative ([place]) ?Staging form: Breast, AJCC 8th Edition ?- Clinical stage from 06/04/2020: Stage IIB (cT2, cN0(f), cM0, G3, ER-, PR-, HER2-) - Unsigned ?Stage prefix: Initial diagnosis ?Method of lymph node assessment: Core biopsy ?Histologic grading system: 3 grade system ? ? ?SUMMARY OF ONCOLOGIC HISTORY: ?36 y.o. [place] woman status post left breast upper outer quadrant biopsy 05/29/2020 for a clinical T2 N0, stage IIB invasive ductal carcinoma, functionally triple negative, with an MIB-1 of 95%. ?  ?(1) genetics testing 06/23/2020 through the [facility] Multi-Cancer Panel found no deleterious mutations in AIP, ALK, APC, ATM, AXIN2,BAP1,  BARD1, BLM, BMPR1A, BRCA1, BRCA2, BRIP1, CASR, CDC73, CDH1, CDK4, CDKN1B, CDKN1C, CDKN2A (p14ARF), CDKN2A (p16INK4a), CEBPA, CHEK2, CTNNA1, DICER1, DIS3L2, EGFR (c.2369C>T, p.Thr790Met variant only), EPCAM (Deletion/duplication testing only), FH, FLCN, GATA2, GPC3, GREM1 (Promoter region deletion/duplication testing only), HOXB13 (c.251G>A, p.Gly84Glu), HRAS, KIT, MAX, MEN1, MET, MITF (c.952G>A, p.Glu318Lys variant only), MLH1, MSH2, MSH3, MSH6, MUTYH, NBN, NF1, NF2, NTHL1, PALB2, PDGFRA, PHOX2B, PMS2, POLD1, POLE, POT1, PRKAR1A, PTCH1, PTEN, RAD50, RAD51C, RAD51D, RB1, RECQL4, RET, RNF43, RUNX1, SDHAF2, SDHA (sequence changes only), SDHB, SDHC, SDHD, SMAD4, SMARCA4, SMARCB1, SMARCE1, STK11, SUFU, TERC, TERT, TMEM127, TP53, TSC1, TSC2, VHL, WRN and WT1.  ?  ?(2) neoadjuvant chemotherapy consisting of cyclophosphamide and doxorubicin in dose dense fashion x4 started 06/19/2020, completed 08/05/2020, followed by paclitaxel and carboplatin weekly x12 starting 08/26/2020, completing 4 doses 09/23/2020 ?            (a) echocardiogram on 06/13/2020 showed  an EF of 60-65% ?            (b) paclitaxel and carboplatin discontinued after 4 doses because of neuropathy and cytopenias ?  ?(3) status post left lumpectomy and sentinel lymph node sampling 11/19/2020 for a residual yp T1c ypN0 invasive ductal carcinoma involving the superior margin ?            (a) additional surgery 12/09/2020 cleared the margin in question ?            (b) repeat prognostic panel confirms triple negative disease ?            (c) status post left mastopexy and right breast reduction on 10/27/2021 with fat grafting bilaterally ?  ?(4) adjuvant radiation : 01/22/2021 through 03/10/2021 ?Site Technique Total Dose (Gy) Dose per Fx (Gy) Completed Fx Beam Energies  ?Breast, Left: Breast_Lt 3D 50.4/50.4 1.8 28/28 6X  ?Breast, Left: Breast_Lt_Bst 3D 10/10 2 5/5 6X  ?            (a) received capecitabine sensitization, started 01/26/2021 ?  ?(5) pembrolizumab/Keytruda began on 04/16/2021, held 05/11/2021 (after 2 doses) with persistent cough, later attributed to asthma ?(A) pembrolizumab resumed 08/06/2021 ?(B) PD-L1 combined score is 10 ?  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  There were no mutations in BRCA1 or 2, ERBB2 or PIK3CA.  The microsatellite status was equivocal and the tumor mutational burden could not be determined on the sample. ?  ?(7) sarcoidosis? (followed by pulmonary: Ramaswamy) ?            (A) chest CT 06/13/2020 shows in addition to the left breast mass multiple ill-defined pulmonary nodules, pleural nodularity and symmetrical by hilar and mediastinal adenopathy consistent with sarcoidosis. ?(B)  chest CT 05/29/2021 shows stable mediastinal and hilar adenopathy as well as improved liver lesions.  No evidence of metastatic disease. ?(C) CT angiogram of the chest on February 02, 2022 shows no evidence of pulmonary embolus and improvement in possible sarcoidosis.  No progressive or new nodularity.  Bronchial wall thickening in the lower lobes  which can be seen with bronchitis or reactive airway disease. ? ? ?CURRENT THERAPY: Pembrolizumab ? ?INTERVAL HISTORY: ?Jeanne Evans 36 y.o. female returns for evaluation prior to receiving her pembrolizumab.  She receives this every 3 weeks.  Please note that this was held between July and September due to questionable cough related to a side effect of the pembrolizumab, however she has been worked up fully by Dr. Chase Caller who has ruled this out. ? ?She was recently admitted to the hospital with asthma exacerbation in the setting of sarcoidosis.  I spoke with Dr. Chase Caller who does not think this is related to the pembrolizumab and states it is due to the asthma and sarcoid.   ? ?She was seen by Dr. Chase Caller yesterday and recommendation was to consider short course of prednisone since she was clinically symptomatic.  She today continues to complain of intermittent cough and shortness of breath.  She has been using her inhalers regularly.  She denies any other side effects from [place]. ?Rest of the pertinent 10 point ROS reviewed and negative ? ?Patient Active Problem List  ? Diagnosis Date Noted  ? Port-A-Cath in place 03/11/2022  ? Acute respiratory failure with hypoxemia ([place]) 02/03/2022  ? Acute respiratory failure with hypoxia ([place]) 02/03/2022  ? Asthma 07/31/2021  ? Lymphedema of left arm 02/02/2021  ? Drug-induced neutropenia ([place]) 09/30/2020  ? Genetic testing 06/26/2020  ? Family history of ovarian cancer   ? Family history of prostate cancer   ? Malignant neoplasm of upper-outer quadrant of left breast in female, estrogen receptor negative ([place]) 06/03/2020  ? ? ?is allergic to sulfa antibiotics. ? ?MEDICAL HISTORY: ?Past Medical History:  ?Diagnosis Date  ? Asthma 07/31/2021  ? Breast cancer ([place])   ? Family history of ovarian cancer   ? Family history of prostate cancer   ? GERD (gastroesophageal reflux disease)   ? History of asthma   ? has albuterol, uses PRN  ? History of heart murmur in  childhood   ? History of multiple allergies   ? ? ?SURGICAL HISTORY: ?Past Surgical History:  ?Procedure Laterality Date  ? BREAST LUMPECTOMY WITH RADIOACTIVE SEED AND SENTINEL LYMPH NODE BIOPSY Left 11/19/2020  ? Procedure: LEFT BREAST LUMPECTOMY X 2 WITH RADIOACTIVE SEED AND SENTINEL LYMPH NODE MAPPING;  Surgeon: Erroll Luna, MD;  Location: [facility];  Service: General;  Laterality: Left;  PECTORAL BLOCK  ? BREAST REDUCTION SURGERY Right 10/27/2021  ? Procedure: MAMMARY REDUCTION  TO RIGHT BREAST;  Surgeon: [name], MD;  Location: [facility];  Service: Plastics;  Laterality: Right;  ? LIPOSUCTION WITH LIPOFILLING Bilateral 10/27/2021  ? Procedure: BILATERAL FAT GRAFTING TO BREAST;  Surgeon: [name], MD;  Location: [facility];  Service: Plastics;  Laterality: Bilateral;  ? PORTACATH PLACEMENT Right 06/18/2020  ? Procedure: INSERTION PORT-A-CATH WITH ULTRASOUND GUIDANCE;  Surgeon: Erroll Luna, MD;  Location: [facility];  Service: General;  Laterality: Right;  ? RE-EXCISION OF BREAST LUMPECTOMY Left 12/09/2020  ? Procedure: RE-EXCISION OF LEFT BREAST LUMPECTOMY;  Surgeon: Erroll Luna, MD;  Location: [facility];  Service: General;  Laterality:  Left;  ? ? ?SOCIAL HISTORY: ?Social History  ? ?Socioeconomic History  ? Marital status: Single  ?  Spouse name: Not on file  ? Number of children: Not on file  ? Years of education: Not on file  ? Highest education level: Not on file  ?Occupational History  ? Not on file  ?Tobacco Use  ? Smoking status: Never  ? Smokeless tobacco: Never  ?Substance and Sexual Activity  ? Alcohol use: Yes  ?  Comment: occas  ? Drug use: Yes  ?  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 Sexual activity: Not on file  ?Other Topics Concern  ? Not on file  ?Social History Narrative  ? Not on file  ? ?Social Determinants of Health  ? ?Financial Resource Strain: High Risk  ? Difficulty of Paying Living  Expenses: Very hard  ?Food Insecurity: Food Insecurity Present  ? Worried About Charity fundraiser in the Last Year: Sometimes true  ? Ran Out of Food in the Last Year: Sometimes true  ?Transportation Needs: No

## 2022-04-02 ENCOUNTER — Inpatient Hospital Stay (HOSPITAL_BASED_OUTPATIENT_CLINIC_OR_DEPARTMENT_OTHER): Payer: 59 | Admitting: Hematology and Oncology

## 2022-04-02 ENCOUNTER — Inpatient Hospital Stay: Payer: 59 | Attending: Oncology

## 2022-04-02 ENCOUNTER — Encounter: Payer: Self-pay | Admitting: Hematology and Oncology

## 2022-04-02 ENCOUNTER — Inpatient Hospital Stay: Payer: 59

## 2022-04-02 ENCOUNTER — Other Ambulatory Visit: Payer: Self-pay

## 2022-04-02 DIAGNOSIS — Z171 Estrogen receptor negative status [ER-]: Secondary | ICD-10-CM | POA: Diagnosis not present

## 2022-04-02 DIAGNOSIS — Z95828 Presence of other vascular implants and grafts: Secondary | ICD-10-CM

## 2022-04-02 DIAGNOSIS — C50412 Malignant neoplasm of upper-outer quadrant of left female breast: Secondary | ICD-10-CM

## 2022-04-02 DIAGNOSIS — Z5112 Encounter for antineoplastic immunotherapy: Secondary | ICD-10-CM | POA: Diagnosis not present

## 2022-04-02 DIAGNOSIS — Z79899 Other long term (current) drug therapy: Secondary | ICD-10-CM | POA: Diagnosis not present

## 2022-04-02 LAB — COMPREHENSIVE METABOLIC PANEL
ALT: 21 U/L (ref 0–44)
AST: 18 U/L (ref 15–41)
Albumin: 4 g/dL (ref 3.5–5.0)
Alkaline Phosphatase: 86 U/L (ref 38–126)
Anion gap: 5 (ref 5–15)
BUN: 13 mg/dL (ref 6–20)
CO2: 27 mmol/L (ref 22–32)
Calcium: 8.9 mg/dL (ref 8.9–10.3)
Chloride: 108 mmol/L (ref 98–111)
Creatinine, Ser: 0.81 mg/dL (ref 0.44–1.00)
GFR, Estimated: >60 mL/min (ref >60)
Glucose, Bld: 110 mg/dL — ABNORMAL HIGH (ref 70–99)
Potassium: 3.9 mmol/L (ref 3.5–5.1)
Sodium: 140 mmol/L (ref 135–145)
Total Bilirubin: 0.5 mg/dL (ref 0.3–1.2)
Total Protein: 7.2 g/dL (ref 6.5–8.1)

## 2022-04-02 LAB — CBC WITH DIFFERENTIAL (CANCER CENTER ONLY)
Abs Immature Granulocytes: 0 10*3/uL (ref 0.00–0.07)
Basophils Absolute: 0 10*3/uL (ref 0.0–0.1)
Basophils Relative: 1 %
Eosinophils Absolute: 0.4 10*3/uL (ref 0.0–0.5)
Eosinophils Relative: 9 %
HCT: 34.7 % — ABNORMAL LOW (ref 36.0–46.0)
Hemoglobin: 11.7 g/dL — ABNORMAL LOW (ref 12.0–15.0)
Immature Granulocytes: 0 %
Lymphocytes Relative: 40 %
Lymphs Abs: 1.9 10*3/uL (ref 0.7–4.0)
MCH: 28.5 pg (ref 26.0–34.0)
MCHC: 33.7 g/dL (ref 30.0–36.0)
MCV: 84.4 fL (ref 80.0–100.0)
Monocytes Absolute: 0.4 10*3/uL (ref 0.1–1.0)
Monocytes Relative: 10 %
Neutro Abs: 1.9 10*3/uL (ref 1.7–7.7)
Neutrophils Relative %: 40 %
Platelet Count: 262 10*3/uL (ref 150–400)
RBC: 4.11 MIL/uL (ref 3.87–5.11)
RDW: 15.2 % (ref 11.5–15.5)
WBC Count: 4.6 10*3/uL (ref 4.0–10.5)
nRBC: 0 % (ref 0.0–0.2)

## 2022-04-02 LAB — TSH: TSH: 0.396 u[IU]/mL (ref 0.350–4.500)

## 2022-04-02 MED ORDER — HEPARIN SOD (PORK) LOCK FLUSH 100 UNIT/ML IV SOLN
500.0000 [IU] | Freq: Once | INTRAVENOUS | Status: AC | PRN
  Administered 2022-04-02: 500 [IU]

## 2022-04-02 MED ORDER — SODIUM CHLORIDE 0.9% FLUSH
10.0000 mL | INTRAVENOUS | Status: DC | PRN
  Administered 2022-04-02: 10 mL

## 2022-04-02 MED ORDER — SODIUM CHLORIDE 0.9 % IV SOLN: Freq: Once | INTRAVENOUS | Status: AC

## 2022-04-02 MED ORDER — SODIUM CHLORIDE 0.9 % IV SOLN
200.0000 mg | Freq: Once | INTRAVENOUS | Status: AC
  Administered 2022-04-02: 200 mg via INTRAVENOUS
  Filled 2022-04-02: qty 200

## 2022-04-02 MED ORDER — SODIUM CHLORIDE 0.9% FLUSH
10.0000 mL | Freq: Once | INTRAVENOUS | Status: AC
  Administered 2022-04-02: 10 mL

## 2022-04-02 NOTE — Patient Instructions (Signed)
[facility]COL[facility]  Discharge Instructions: Thank you for choosing [facility] to provide your oncology and hematology care.   If you have a lab appointment with the Cancer Center, please go directly to the Cancer Center and check in at the registration area.   Wear comfortable clothing and clothing appropriate for easy access to any Portacath or PICC line.   We strive to give you quality time with your provider. You may need to reschedule your appointment if you arrive late (15 or more minutes).  Arriving late affects you and other patients whose appointments are after yours.  Also, if you miss three or more appointments without notifying the office, you may be dismissed from the clinic at the provider's discretion.      For prescription refill requests, have your pharmacy contact our office and allow 72 hours for refills to be completed.    Today you received the following chemotherapy and/or immunotherapy agents: keytruda      To help prevent nausea and vomiting after your treatment, we encourage you to take your nausea medication as directed.  BELOW ARE SYMPTOMS THAT SHOULD BE REPORTED IMMEDIATELY: *FEVER GREATER THAN 100.4 F (38 C) OR HIGHER *CHILLS OR SWEATING *NAUSEA AND VOMITING THAT IS NOT CONTROLLED WITH YOUR NAUSEA MEDICATION *UNUSUAL SHORTNESS OF BREATH *UNUSUAL BRUISING OR BLEEDING *URINARY PROBLEMS (pain or burning when urinating, or frequent urination) *BOWEL PROBLEMS (unusual diarrhea, constipation, pain near the anus) TENDERNESS IN MOUTH AND THROAT WITH OR WITHOUT PRESENCE OF ULCERS (sore throat, sores in mouth, or a toothache) UNUSUAL RASH, SWELLING OR PAIN  UNUSUAL VAGINAL DISCHARGE OR ITCHING   Items with * indicate a potential emergency and should be followed up as soon as possible or go to the Emergency Department if any problems should occur.  Please show the CHEMOTHERAPY ALERT CARD or IMMUNOTHERAPY ALERT CARD at check-in to  the Emergency Department and triage nurse.  Should you have questions after your visit or need to cancel or reschedule your appointment, please contact  [facility]  Dept: [phone]  and follow the prompts.  Office hours are 8:00 a.m. to 4:30 p.m. Monday - Friday. Please note that voicemails left after 4:00 p.m. may not be returned until the following business day.  We are closed weekends and major holidays. You have access to a nurse at all times for urgent questions. Please call the main number to the clinic Dept: [phone] and follow the prompts.   For any non-urgent questions, you may also contact your provider using MyChart. We now offer e-Visits for anyone 18 and older to request care online for non-urgent symptoms. For details visit [email or web address].   Also download the MyChart app! Go to the app store, search "MyChart", open the app, select , and log in with your MyChart username and password.  Due to Covid, a mask is required upon entering the hospital/clinic. If you do not have a mask, one will be given to you upon arrival. For doctor visits, patients may have 1 support person aged 18 or older with them. For treatment visits, patients cannot have anyone with them due to current Covid guidelines and our immunocompromised population.   

## 2022-04-02 NOTE — Progress Notes (Signed)
Jeanne Kava, MD [address] [place] [address]   DIAGNOSIS:  Cancer Staging  Malignant neoplasm of upper-outer quadrant of left breast in female, estrogen receptor negative ([place]) Staging form: Breast, AJCC 8th Edition - Clinical stage from 06/04/2020: Stage IIB (cT2, cN0(f), cM0, G3, ER-, PR-, HER2-) - Unsigned Stage prefix: Initial diagnosis Method of lymph node assessment: Core biopsy Histologic grading system: 3 grade system   SUMMARY OF ONCOLOGIC HISTORY: 36 y.o. [name] woman status post left breast upper outer quadrant biopsy 05/29/2020 for a clinical T2 N0, stage IIB invasive ductal carcinoma, functionally triple negative, with an MIB-1 of 95%.   (1) genetics testing 06/23/2020 through the [facility] Multi-Cancer Panel found no deleterious mutations in AIP, ALK, APC, ATM, AXIN2,BAP1,  BARD1, BLM, BMPR1A, BRCA1, BRCA2, BRIP1, CASR, CDC73, CDH1, CDK4, CDKN1B, CDKN1C, CDKN2A (p14ARF), CDKN2A (p16INK4a), CEBPA, CHEK2, CTNNA1, DICER1, DIS3L2, EGFR (c.2369C>T, p.Thr790Met variant only), EPCAM (Deletion/duplication testing only), FH, FLCN, GATA2, GPC3, GREM1 (Promoter region deletion/duplication testing only), HOXB13 (c.251G>A, p.Gly84Glu), HRAS, KIT, MAX, MEN1, MET, MITF (c.952G>A, p.Glu318Lys variant only), MLH1, MSH2, MSH3, MSH6, MUTYH, NBN, NF1, NF2, NTHL1, PALB2, PDGFRA, PHOX2B, PMS2, POLD1, POLE, POT1, PRKAR1A, PTCH1, PTEN, RAD50, RAD51C, RAD51D, RB1, RECQL4, RET, RNF43, RUNX1, SDHAF2, SDHA (sequence changes only), SDHB, SDHC, SDHD, SMAD4, SMARCA4, SMARCB1, SMARCE1, STK11, SUFU, TERC, TERT, TMEM127, TP53, TSC1, TSC2, VHL, WRN and WT1.    (2) neoadjuvant chemotherapy consisting of cyclophosphamide and doxorubicin in dose dense fashion x4 started 06/19/2020, completed 08/05/2020, followed by paclitaxel and carboplatin weekly x12 starting 08/26/2020, completing 4 doses 09/23/2020             (a) echocardiogram on 06/13/2020 showed an EF of 60-65%             (b) paclitaxel  and carboplatin discontinued after 4 doses because of neuropathy and cytopenias   (3) status post left lumpectomy and sentinel lymph node sampling 11/19/2020 for a residual yp T1c ypN0 invasive ductal carcinoma involving the superior margin             (a) additional surgery 12/09/2020 cleared the margin in question             (b) repeat prognostic panel confirms triple negative disease             (c) status post left mastopexy and right breast reduction on 10/27/2021 with fat grafting bilaterally   (4) adjuvant radiation : 01/22/2021 through 03/10/2021 Site Technique Total Dose (Gy) Dose per Fx (Gy) Completed Fx Beam Energies  Breast, Left: Breast_Lt 3D 50.4/50.4 1.8 28/28 6X  Breast, Left: Breast_Lt_Bst 3D 10/10 2 5/5 6X              (a) received capecitabine sensitization, started 01/26/2021   (5) pembrolizumab/Keytruda began on 04/16/2021, held 05/11/2021 (after 2 doses) with persistent cough, later attributed to asthma (A) pembrolizumab resumed 08/06/2021 (B) PD-L1 combined score is 10   (6) molecular pathology from the 11/19/2020 sample 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  There were no mutations in BRCA1 or 2, ERBB2 or PIK3CA.  The microsatellite status was equivocal and the tumor mutational burden could not be determined on the sample.   (7) sarcoidosis? (followed by pulmonary: Ramaswamy)             (A) chest CT 06/13/2020 shows in addition to the left breast mass multiple ill-defined pulmonary nodules, pleural nodularity and symmetrical by hilar and mediastinal adenopathy consistent with sarcoidosis. (B) chest CT 05/29/2021 shows stable mediastinal and hilar adenopathy  as well as improved liver lesions.  No evidence of metastatic disease. (C) CT angiogram of the chest on February 02, 2022 shows no evidence of pulmonary embolus and improvement in possible sarcoidosis.  No progressive or new nodularity.  Bronchial wall thickening in the lower lobes which can be seen with bronchitis or reactive  airway disease.   CURRENT THERAPY: Pembrolizumab  INTERVAL HISTORY:  Jeanne Evans 36 y.o. female returns for evaluation prior to receiving her pembrolizumab.  She tells me just she feels exhausted from working too many days, hopes to take a couple days off next week.  Her shortness of breath has resolved otherwise.  No worsening cough or chest pain.  No other adverse effects from immunotherapy.  She did not end up taking the prednisone  Rest of the pertinent 10 point ROS reviewed and negative  Patient Active Problem List   Diagnosis Date Noted   Port-A-Cath in place 03/11/2022   Acute respiratory failure with hypoxemia ([place]) 02/03/2022   Acute respiratory failure with hypoxia ([place]) 02/03/2022   Asthma 07/31/2021   Lymphedema of left arm 02/02/2021   Drug-induced neutropenia ([place]) 09/30/2020   Genetic testing 06/26/2020   Family history of ovarian cancer    Family history of prostate cancer    Malignant neoplasm of upper-outer quadrant of left breast in female, estrogen receptor negative ([place]) 06/03/2020    is allergic to sulfa antibiotics.  MEDICAL HISTORY: Past Medical History:  Diagnosis Date   Asthma 07/31/2021   Breast cancer ([place])    Family history of ovarian cancer    Family history of prostate cancer    GERD (gastroesophageal reflux disease)    History of asthma    has albuterol, uses PRN   History of heart murmur in childhood    History of multiple allergies     SURGICAL HISTORY: Past Surgical History:  Procedure Laterality Date   BREAST LUMPECTOMY WITH RADIOACTIVE SEED AND SENTINEL LYMPH NODE BIOPSY Left 11/19/2020   Procedure: LEFT BREAST LUMPECTOMY X 2 WITH RADIOACTIVE SEED AND SENTINEL LYMPH NODE MAPPING;  Surgeon: Erroll Luna, MD;  Location: [facility];  Service: General;  Laterality: Left;  PECTORAL BLOCK   BREAST REDUCTION SURGERY Right 10/27/2021   Procedure: MAMMARY REDUCTION  TO RIGHT BREAST;  Surgeon: [name], MD;   Location: [facility];  Service: Plastics;  Laterality: Right;   LIPOSUCTION WITH LIPOFILLING Bilateral 10/27/2021   Procedure: BILATERAL FAT GRAFTING TO BREAST;  Surgeon: [name], MD;  Location: [place];  Service: Plastics;  Laterality: Bilateral;   PORTACATH PLACEMENT Right 06/18/2020   Procedure: INSERTION PORT-A-CATH WITH ULTRASOUND GUIDANCE;  Surgeon: Erroll Luna, MD;  Location: [facility] [facility];  Service: General;  Laterality: Right;   RE-EXCISION OF BREAST LUMPECTOMY Left 12/09/2020   Procedure: RE-EXCISION OF LEFT BREAST LUMPECTOMY;  Surgeon: Erroll Luna, MD;  Location: [facility];  Service: General;  Laterality: Left;    SOCIAL HISTORY: Social History   Socioeconomic History   Marital status: Single    Spouse name: Not on file   Number of children: Not on file   Years of education: Not on file   Highest education level: Not on file  Occupational History   Not on file  Tobacco Use   Smoking status: Never   Smokeless tobacco: Never  Substance and Sexual Activity   Alcohol use: Yes    Comment: occas   Drug use: Yes    Types: Marijuana  Comment: daily   Sexual activity: Not on file  Other Topics Concern   Not on file  Social History Narrative   Not on file   Social Determinants of Health   Financial Resource Strain: High Risk   Difficulty of Paying Living Expenses: Very hard  Food Insecurity: Food Insecurity Present   Worried About [place] in the Last Year: Sometimes true   Ran Out of Food in the Last Year: Sometimes true  Transportation Needs: No Transportation Needs   Lack of Transportation (Medical): No   Lack of Transportation (Non-Medical): No  Physical Activity: Not on file  Stress: Stress Concern Present   Feeling of Stress : Very much  Social Connections: Socially Isolated   Frequency of Communication with Friends and Family: More than three times a week   Frequency of  Social Gatherings with Friends and Family: Once a week   Attends Religious Services: Never   Marine scientist or Organizations: No   Attends Music therapist: Never   Marital Status: Never married  Human resources officer Violence: Not on file    FAMILY HISTORY: Family History  Problem Relation Age of Onset   Lung cancer Maternal Uncle        dx late 10s   Prostate cancer Paternal Uncle        dx late 46s   Ovarian cancer Paternal Grandmother        dx 34s   Prostate cancer Paternal Grandfather        dx 19s   Prostate cancer Paternal Uncle        dx early 16s    Review of Systems  Constitutional:  Positive for fatigue. Negative for appetite change, chills, fever and unexpected weight change.  HENT:   Negative for hearing loss, lump/mass, mouth sores, sore throat and trouble swallowing.   Eyes:  Negative for eye problems and icterus.  Respiratory:  Negative for chest tightness, cough and shortness of breath.   Cardiovascular:  Negative for chest pain, leg swelling and palpitations.  Gastrointestinal:  Negative for abdominal distention, abdominal pain, constipation, diarrhea, nausea and vomiting.  Endocrine: Negative for hot flashes.  Genitourinary:  Negative for difficulty urinating.   Musculoskeletal:  Negative for arthralgias.  Skin:  Negative for itching and rash.  Neurological:  Negative for dizziness, extremity weakness, headaches and numbness.  Hematological:  Negative for adenopathy. Does not bruise/bleed easily.  Psychiatric/Behavioral:  Negative for depression. The patient is not nervous/anxious.      PHYSICAL EXAMINATION  ECOG PERFORMANCE STATUS: 1 - Symptomatic but completely ambulatory  Vitals:   04/02/22 1138  BP: (!) 146/90  Pulse: 83  Resp: 16  Temp: 97.7 F (36.5 C)  SpO2: 100%    Physical Exam Constitutional:      General: She is not in acute distress.    Appearance: Normal appearance. She is not toxic-appearing.  HENT:     Head:  Normocephalic and atraumatic.     Mouth/Throat:     Mouth: Mucous membranes are moist.  Eyes:     General: No scleral icterus. Cardiovascular:     Rate and Rhythm: Normal rate and regular rhythm.     Pulses: Normal pulses.     Heart sounds: Normal heart sounds.  Pulmonary:     Effort: Pulmonary effort is normal.     Breath sounds: Normal breath sounds.  Abdominal:     General: Abdomen is flat. Bowel sounds are normal. There is no distension.  Palpations: Abdomen is soft.     Tenderness: There is no abdominal tenderness.  Musculoskeletal:        General: No swelling.     Cervical back: Neck supple.  Lymphadenopathy:     Cervical: No cervical adenopathy.  Skin:    General: Skin is warm and dry.     Findings: No rash.  Neurological:     General: No focal deficit present.     Mental Status: She is alert.  Psychiatric:        Mood and Affect: Mood normal.        Behavior: Behavior normal.    LABORATORY DATA:  CBC    Component Value Date/Time   WBC 4.6 04/02/2022 1132   WBC 11.9 (H) 02/05/2022 0734   RBC 4.11 04/02/2022 1132   HGB 11.7 (L) 04/02/2022 1132   HCT 34.7 (L) 04/02/2022 1132   PLT 262 04/02/2022 1132   MCV 84.4 04/02/2022 1132   MCH 28.5 04/02/2022 1132   MCHC 33.7 04/02/2022 1132   RDW 15.2 04/02/2022 1132   LYMPHSABS 1.9 04/02/2022 1132   MONOABS 0.4 04/02/2022 1132   EOSABS 0.4 04/02/2022 1132   BASOSABS 0.0 04/02/2022 1132    CMP     Component Value Date/Time   NA 140 04/02/2022 1132   K 3.9 04/02/2022 1132   CL 108 04/02/2022 1132   CO2 27 04/02/2022 1132   GLUCOSE 110 (H) 04/02/2022 1132   BUN 13 04/02/2022 1132   CREATININE 0.81 04/02/2022 1132   CREATININE 1.03 (H) 10/14/2021 1356   CALCIUM 8.9 04/02/2022 1132   PROT 7.2 04/02/2022 1132   ALBUMIN 4.0 04/02/2022 1132   AST 18 04/02/2022 1132   AST 18 10/14/2021 1356   ALT 21 04/02/2022 1132   ALT 21 10/14/2021 1356   ALKPHOS 86 04/02/2022 1132   BILITOT 0.5 04/02/2022 1132    BILITOT 0.8 10/14/2021 1356   GFRNONAA >60 04/02/2022 1132   GFRNONAA >60 10/14/2021 1356   GFRAA >60 08/05/2020 0955   GFRAA >60 06/04/2020 0830        ASSESSMENT and THERAPY PLAN:   Malignant neoplasm of upper-outer quadrant of left breast in female, estrogen receptor negative ([place]) Ms. 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 She is tolerating Keytruda very well.  Cough, shortness of breath have resolved since last visit. Physical examination today without any concerns.  No adverse effects from [place].  She will proceed with immunotherapy as planned today.  She reports some pain in her soles especially when she stands up after sitting for prolonged periods or waking up after sleep.  She was hoping to see podiatry for evaluation.  Encouraged her to do this, this could likely be plantar fasciitis but may benefit from podiatry. She will otherwise return to clinic before her next planned cycle of immunotherapy   All questions were answered. The patient knows to call the clinic with any problems, questions or concerns. We can certainly see the patient much sooner if necessary.  Total encounter time:30 minutes*in face-to-face visit time, chart review, lab review, care coordination, order entry, and documentation of the encounter time.  Benay Pike MD   *Total Encounter Time as defined by the Centers for Medicare and Medicaid Services includes, in addition to the face-to-face time of a patient visit (documented in the note above) non-face-to-face time: obtaining and reviewing outside history, ordering and reviewing  medications, tests or procedures, care coordination (communications with other health care professionals or caregivers) and documentation in the medical record.  

## 2022-04-02 NOTE — Assessment & Plan Note (Addendum)
Ms. Kepple is a 36 year old woman with 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 She is tolerating Keytruda very well.  Cough, shortness of breath have resolved since last visit. Physical examination today without any concerns.  No adverse effects from [place].  She will proceed with immunotherapy as planned today.  She reports some pain in her soles especially when she stands up after sitting for prolonged periods or waking up after sleep.  She was hoping to see podiatry for evaluation.  Encouraged her to do this, this could likely be plantar fasciitis but may benefit from podiatry. She will otherwise return to clinic before her next planned cycle of immunotherapy

## 2022-04-14 ENCOUNTER — Telehealth: Payer: Self-pay | Admitting: Internal Medicine

## 2022-04-14 DIAGNOSIS — R768 Other specified abnormal immunological findings in serum: Secondary | ICD-10-CM

## 2022-04-14 DIAGNOSIS — J4540 Moderate persistent asthma, uncomplicated: Secondary | ICD-10-CM

## 2022-04-14 DIAGNOSIS — D721 Eosinophilia, unspecified: Secondary | ICD-10-CM

## 2022-04-14 NOTE — Telephone Encounter (Signed)
A new referral to Allergy and Asthma has been placed. Called and spoke with pt letting her know this had been done and also provided her with phone number for her to call to see if she could get an appt scheduled. Nothing further needed.

## 2022-04-23 ENCOUNTER — Other Ambulatory Visit: Payer: Self-pay

## 2022-04-23 ENCOUNTER — Inpatient Hospital Stay (HOSPITAL_BASED_OUTPATIENT_CLINIC_OR_DEPARTMENT_OTHER): Payer: 59 | Admitting: Hematology and Oncology

## 2022-04-23 ENCOUNTER — Telehealth: Payer: Self-pay | Admitting: *Deleted

## 2022-04-23 ENCOUNTER — Encounter: Payer: Self-pay | Admitting: Hematology and Oncology

## 2022-04-23 ENCOUNTER — Inpatient Hospital Stay: Payer: 59

## 2022-04-23 ENCOUNTER — Inpatient Hospital Stay: Payer: 59 | Attending: Oncology

## 2022-04-23 DIAGNOSIS — Z5112 Encounter for antineoplastic immunotherapy: Secondary | ICD-10-CM | POA: Insufficient documentation

## 2022-04-23 DIAGNOSIS — C50412 Malignant neoplasm of upper-outer quadrant of left female breast: Secondary | ICD-10-CM | POA: Diagnosis present

## 2022-04-23 DIAGNOSIS — Z171 Estrogen receptor negative status [ER-]: Secondary | ICD-10-CM | POA: Diagnosis not present

## 2022-04-23 DIAGNOSIS — Z79899 Other long term (current) drug therapy: Secondary | ICD-10-CM | POA: Insufficient documentation

## 2022-04-23 LAB — CBC WITH DIFFERENTIAL (CANCER CENTER ONLY)
Abs Immature Granulocytes: 0.01 10*3/uL (ref 0.00–0.07)
Basophils Absolute: 0 10*3/uL (ref 0.0–0.1)
Basophils Relative: 1 %
Eosinophils Absolute: 0.6 10*3/uL — ABNORMAL HIGH (ref 0.0–0.5)
Eosinophils Relative: 12 %
HCT: 35.5 % — ABNORMAL LOW (ref 36.0–46.0)
Hemoglobin: 11.8 g/dL — ABNORMAL LOW (ref 12.0–15.0)
Immature Granulocytes: 0 %
Lymphocytes Relative: 43 %
Lymphs Abs: 2 10*3/uL (ref 0.7–4.0)
MCH: 28.4 pg (ref 26.0–34.0)
MCHC: 33.2 g/dL (ref 30.0–36.0)
MCV: 85.5 fL (ref 80.0–100.0)
Monocytes Absolute: 0.4 10*3/uL (ref 0.1–1.0)
Monocytes Relative: 9 %
Neutro Abs: 1.6 10*3/uL — ABNORMAL LOW (ref 1.7–7.7)
Neutrophils Relative %: 35 %
Platelet Count: 254 10*3/uL (ref 150–400)
RBC: 4.15 MIL/uL (ref 3.87–5.11)
RDW: 15.1 % (ref 11.5–15.5)
WBC Count: 4.6 10*3/uL (ref 4.0–10.5)
nRBC: 0 % (ref 0.0–0.2)

## 2022-04-23 LAB — COMPREHENSIVE METABOLIC PANEL
ALT: 21 U/L (ref 0–44)
AST: 17 U/L (ref 15–41)
Albumin: 4.2 g/dL (ref 3.5–5.0)
Alkaline Phosphatase: 81 U/L (ref 38–126)
Anion gap: 7 (ref 5–15)
BUN: 16 mg/dL (ref 6–20)
CO2: 24 mmol/L (ref 22–32)
Calcium: 9.1 mg/dL (ref 8.9–10.3)
Chloride: 108 mmol/L (ref 98–111)
Creatinine, Ser: 0.79 mg/dL (ref 0.44–1.00)
GFR, Estimated: >60 mL/min (ref >60)
Glucose, Bld: 139 mg/dL — ABNORMAL HIGH (ref 70–99)
Potassium: 4 mmol/L (ref 3.5–5.1)
Sodium: 139 mmol/L (ref 135–145)
Total Bilirubin: 0.5 mg/dL (ref 0.3–1.2)
Total Protein: 7 g/dL (ref 6.5–8.1)

## 2022-04-23 LAB — TSH: TSH: 0.634 u[IU]/mL (ref 0.350–4.500)

## 2022-04-23 MED ORDER — HEPARIN SOD (PORK) LOCK FLUSH 100 UNIT/ML IV SOLN
500.0000 [IU] | Freq: Once | INTRAVENOUS | Status: AC | PRN
  Administered 2022-04-23: 500 [IU]

## 2022-04-23 MED ORDER — SODIUM CHLORIDE 0.9 % IV SOLN: Freq: Once | INTRAVENOUS | Status: AC

## 2022-04-23 MED ORDER — SODIUM CHLORIDE 0.9% FLUSH
10.0000 mL | INTRAVENOUS | Status: DC | PRN
  Administered 2022-04-23: 10 mL

## 2022-04-23 MED ORDER — SODIUM CHLORIDE 0.9 % IV SOLN
200.0000 mg | Freq: Once | INTRAVENOUS | Status: AC
  Administered 2022-04-23: 200 mg via INTRAVENOUS
  Filled 2022-04-23: qty 200

## 2022-04-23 NOTE — Telephone Encounter (Signed)
04/16/2022 [name] [phone] (home) left a message Papers being dropped off.  Call me when they are done."   04/20/2022 Advised collaborative no forms yet received.    04/23/2022 Received "[place] paperwork in completed form pick up folder.  Completed by collaborative nurse with note reading "Report given to patient".  This nurse provided form to [facility] to prep for EMR scan department.   Processing status complete with no further form activity instructions to perform or required.

## 2022-04-23 NOTE — Assessment & Plan Note (Signed)
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 She is tolerating Keytruda very well.  Cough, shortness of breath have been stable to improved since last visit. Physical examination today without any concerns.  No adverse effects from [facility].  She will proceed with immunotherapy as planned today.   Today is her last cycle of immunotherapy.

## 2022-04-23 NOTE — Progress Notes (Signed)
Jeanne Kava, MD [address] [place] [address]   DIAGNOSIS:  Cancer Staging  Malignant neoplasm of upper-outer quadrant of left breast in female, estrogen receptor negative ([place]) Staging form: Breast, AJCC 8th Edition - Clinical stage from 06/04/2020: Stage IIB (cT2, cN0(f), cM0, G3, ER-, PR-, HER2-) - Unsigned Stage prefix: Initial diagnosis Method of lymph node assessment: Core biopsy Histologic grading system: 3 grade system   SUMMARY OF ONCOLOGIC HISTORY: 36 y.o. [name] woman status post left breast upper outer quadrant biopsy 05/29/2020 for a clinical T2 N0, stage IIB invasive ductal carcinoma, functionally triple negative, with an MIB-1 of 95%.   (1) genetics testing 06/23/2020 through the [facility] Multi-Cancer Panel found no deleterious mutations in AIP, ALK, APC, ATM, AXIN2,BAP1,  BARD1, BLM, BMPR1A, BRCA1, BRCA2, BRIP1, CASR, CDC73, CDH1, CDK4, CDKN1B, CDKN1C, CDKN2A (p14ARF), CDKN2A (p16INK4a), CEBPA, CHEK2, CTNNA1, DICER1, DIS3L2, EGFR (c.2369C>T, p.Thr790Met variant only), EPCAM (Deletion/duplication testing only), FH, FLCN, GATA2, GPC3, GREM1 (Promoter region deletion/duplication testing only), HOXB13 (c.251G>A, p.Gly84Glu), HRAS, KIT, MAX, MEN1, MET, MITF (c.952G>A, p.Glu318Lys variant only), MLH1, MSH2, MSH3, MSH6, MUTYH, NBN, NF1, NF2, NTHL1, PALB2, PDGFRA, PHOX2B, PMS2, POLD1, POLE, POT1, PRKAR1A, PTCH1, PTEN, RAD50, RAD51C, RAD51D, RB1, RECQL4, RET, RNF43, RUNX1, SDHAF2, SDHA (sequence changes only), SDHB, SDHC, SDHD, SMAD4, SMARCA4, SMARCB1, SMARCE1, STK11, SUFU, TERC, TERT, TMEM127, TP53, TSC1, TSC2, VHL, WRN and WT1.    (2) neoadjuvant chemotherapy consisting of cyclophosphamide and doxorubicin in dose dense fashion x4 started 06/19/2020, completed 08/05/2020, followed by paclitaxel and carboplatin weekly x12 starting 08/26/2020, completing 4 doses 09/23/2020             (a) echocardiogram on 06/13/2020 showed an EF of 60-65%             (b) paclitaxel  and carboplatin discontinued after 4 doses because of neuropathy and cytopenias   (3) status post left lumpectomy and sentinel lymph node sampling 11/19/2020 for a residual yp T1c ypN0 invasive ductal carcinoma involving the superior margin             (a) additional surgery 12/09/2020 cleared the margin in question             (b) repeat prognostic panel confirms triple negative disease             (c) status post left mastopexy and right breast reduction on 10/27/2021 with fat grafting bilaterally   (4) adjuvant radiation : 01/22/2021 through 03/10/2021 Site Technique Total Dose (Gy) Dose per Fx (Gy) Completed Fx Beam Energies  Breast, Left: Breast_Lt 3D 50.4/50.4 1.8 28/28 6X  Breast, Left: Breast_Lt_Bst 3D 10/10 2 5/5 6X              (a) received capecitabine sensitization, started 01/26/2021   (5) pembrolizumab/Keytruda began on 04/16/2021, held 05/11/2021 (after 2 doses) with persistent cough, later attributed to asthma (A) pembrolizumab resumed 08/06/2021 (B) PD-L1 combined score is 10   (6) molecular pathology from the 11/19/2020 sample 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  There were no mutations in BRCA1 or 2, ERBB2 or PIK3CA.  The microsatellite status was equivocal and the tumor mutational burden could not be determined on the sample.   (7) sarcoidosis? (followed by pulmonary: Ramaswamy)             (A) chest CT 06/13/2020 shows in addition to the left breast mass multiple ill-defined pulmonary nodules, pleural nodularity and symmetrical by hilar and mediastinal adenopathy consistent with sarcoidosis. (B) chest CT 05/29/2021 shows stable mediastinal and hilar adenopathy  as well as improved liver lesions.  No evidence of metastatic disease. (C) CT angiogram of the chest on February 02, 2022 shows no evidence of pulmonary embolus and improvement in possible sarcoidosis.  No progressive or new nodularity.  Bronchial wall thickening in the lower lobes which can be seen with bronchitis or reactive  airway disease.   CURRENT THERAPY: Pembrolizumab  INTERVAL HISTORY:  Jeanne Evans 36 y.o. female returns for evaluation prior to receiving her pembrolizumab.  She is here for a follow up. She says cough is less than before. She has an allergist appointment in July. No change in SOB. No skin rash or diarrhea.  Rest of the pertinent 10 point ROS reviewed and negative  Patient Active Problem List   Diagnosis Date Noted   Port-A-Cath in place 03/11/2022   Acute respiratory failure with hypoxemia ([place]) 02/03/2022   Acute respiratory failure with hypoxia ([place]) 02/03/2022   Asthma 07/31/2021   Lymphedema of left arm 02/02/2021   Drug-induced neutropenia ([place]) 09/30/2020   Genetic testing 06/26/2020   Family history of ovarian cancer    Family history of prostate cancer    Malignant neoplasm of upper-outer quadrant of left breast in female, estrogen receptor negative ([place]) 06/03/2020    is allergic to sulfa antibiotics.  MEDICAL HISTORY: Past Medical History:  Diagnosis Date   Asthma 07/31/2021   Breast cancer ([place])    Family history of ovarian cancer    Family history of prostate cancer    GERD (gastroesophageal reflux disease)    History of asthma    has albuterol, uses PRN   History of heart murmur in childhood    History of multiple allergies     SURGICAL HISTORY: Past Surgical History:  Procedure Laterality Date   BREAST LUMPECTOMY WITH RADIOACTIVE SEED AND SENTINEL LYMPH NODE BIOPSY Left 11/19/2020   Procedure: LEFT BREAST LUMPECTOMY X 2 WITH RADIOACTIVE SEED AND SENTINEL LYMPH NODE MAPPING;  Surgeon: Erroll Luna, MD;  Location: [facility];  Service: General;  Laterality: Left;  PECTORAL BLOCK   BREAST REDUCTION SURGERY Right 10/27/2021   Procedure: MAMMARY REDUCTION  TO RIGHT BREAST;  Surgeon: [name], MD;  Location: [facility];  Service: Plastics;  Laterality: Right;   LIPOSUCTION WITH LIPOFILLING Bilateral 10/27/2021    Procedure: BILATERAL FAT GRAFTING TO BREAST;  Surgeon: [name], MD;  Location: [facility];  Service: Plastics;  Laterality: Bilateral;   PORTACATH PLACEMENT Right 06/18/2020   Procedure: INSERTION PORT-A-CATH WITH ULTRASOUND GUIDANCE;  Surgeon: Erroll Luna, MD;  Location: [facility];  Service: General;  Laterality: Right;   RE-EXCISION OF BREAST LUMPECTOMY Left 12/09/2020   Procedure: RE-EXCISION OF LEFT BREAST LUMPECTOMY;  Surgeon: Erroll Luna, MD;  Location: [place];  Service: General;  Laterality: Left;    SOCIAL HISTORY: Social History   Socioeconomic History   Marital status: Single    Spouse name: Not on file   Number of children: Not on file   Years of education: Not on file   Highest education level: Not on file  Occupational History   Not on file  Tobacco Use   Smoking status: Never   Smokeless tobacco: Never  Substance and Sexual Activity   Alcohol use: Yes    Comment: occas   Drug use: Yes    Types: Marijuana    Comment: daily   Sexual activity: Not on file  Other Topics Concern   Not on file  Social History Narrative   Not on file   Social Determinants of Health   Financial Resource Strain: High Risk (09/18/2021)   Overall Financial Resource Strain (CARDIA)    Difficulty of Paying Living Expenses: Very hard  Food Insecurity: Food Insecurity Present (09/18/2021)   Hunger Vital Sign    Worried About Running Out of Food in the Last Year: Sometimes true    Ran Out of Food in the Last Year: Sometimes true  Transportation Needs: No Transportation Needs (09/18/2021)   PRAPARE - Administrator, Civil Service (Medical): No    Lack of Transportation (Non-Medical): No  Physical Activity: Not on file  Stress: Stress Concern Present (09/18/2021)   [employer] of Occupational Health - Occupational Stress Questionnaire    Feeling of Stress : Very much  Social Connections: Socially Isolated  (09/18/2021)   Social Connection and Isolation Panel [NHANES]    Frequency of Communication with Friends and Family: More than three times a week    Frequency of Social Gatherings with Friends and Family: Once a week    Attends Religious Services: Never    Database administrator or Organizations: No    Attends Engineer, structural: Never    Marital Status: Never married  Catering manager Violence: Not on file    FAMILY HISTORY: Family History  Problem Relation Age of Onset   Lung cancer Maternal Uncle        dx late 20s   Prostate cancer Paternal Uncle        dx late 42s   Ovarian cancer Paternal Grandmother        dx 81s   Prostate cancer Paternal Grandfather        dx 74s   Prostate cancer Paternal Uncle        dx early 14s    Review of Systems  Constitutional:  Positive for fatigue. Negative for appetite change, chills, fever and unexpected weight change.  HENT:   Negative for hearing loss, lump/mass, mouth sores, sore throat and trouble swallowing.   Eyes:  Negative for eye problems and icterus.  Respiratory:  Negative for chest tightness, cough and shortness of breath.   Cardiovascular:  Negative for chest pain, leg swelling and palpitations.  Gastrointestinal:  Negative for abdominal distention, abdominal pain, constipation, diarrhea, nausea and vomiting.  Endocrine: Negative for hot flashes.  Genitourinary:  Negative for difficulty urinating.   Musculoskeletal:  Negative for arthralgias.  Skin:  Negative for itching and rash.  Neurological:  Negative for dizziness, extremity weakness, headaches and numbness.  Hematological:  Negative for adenopathy. Does not bruise/bleed easily.  Psychiatric/Behavioral:  Negative for depression. The patient is not nervous/anxious.       PHYSICAL EXAMINATION  ECOG PERFORMANCE STATUS: 1 - Symptomatic but completely ambulatory  Vitals:   04/23/22 0911  BP: (!) 147/91  Pulse: 79  Resp: 18  Temp: 97.7 F (36.5 C)  SpO2:  98%    Physical Exam Constitutional:      General: She is not in acute distress.    Appearance: Normal appearance. She is not toxic-appearing.  HENT:     Head: Normocephalic and atraumatic.     Mouth/Throat:     Mouth: Mucous membranes are moist.  Eyes:     General: No scleral icterus. Cardiovascular:     Rate and Rhythm: Normal rate and regular rhythm.     Pulses: Normal pulses.     Heart sounds: Normal heart sounds.  Pulmonary:  Effort: Pulmonary effort is normal.     Breath sounds: Normal breath sounds.  Abdominal:     General: Abdomen is flat. Bowel sounds are normal. There is no distension.     Palpations: Abdomen is soft.     Tenderness: There is no abdominal tenderness.  Musculoskeletal:        General: No swelling.     Cervical back: Neck supple.  Lymphadenopathy:     Cervical: No cervical adenopathy.  Skin:    General: Skin is warm and dry.     Findings: No rash.  Neurological:     General: No focal deficit present.     Mental Status: She is alert.  Psychiatric:        Mood and Affect: Mood normal.        Behavior: Behavior normal.     LABORATORY DATA:  CBC    Component Value Date/Time   WBC 4.6 04/02/2022 1132   WBC 11.9 (H) 02/05/2022 0734   RBC 4.11 04/02/2022 1132   HGB 11.7 (L) 04/02/2022 1132   HCT 34.7 (L) 04/02/2022 1132   PLT 262 04/02/2022 1132   MCV 84.4 04/02/2022 1132   MCH 28.5 04/02/2022 1132   MCHC 33.7 04/02/2022 1132   RDW 15.2 04/02/2022 1132   LYMPHSABS 1.9 04/02/2022 1132   MONOABS 0.4 04/02/2022 1132   EOSABS 0.4 04/02/2022 1132   BASOSABS 0.0 04/02/2022 1132    CMP     Component Value Date/Time   NA 140 04/02/2022 1132   K 3.9 04/02/2022 1132   CL 108 04/02/2022 1132   CO2 27 04/02/2022 1132   GLUCOSE 110 (H) 04/02/2022 1132   BUN 13 04/02/2022 1132   CREATININE 0.81 04/02/2022 1132   CREATININE 1.03 (H) 10/14/2021 1356   CALCIUM 8.9 04/02/2022 1132   PROT 7.2 04/02/2022 1132   ALBUMIN 4.0 04/02/2022 1132    AST 18 04/02/2022 1132   AST 18 10/14/2021 1356   ALT 21 04/02/2022 1132   ALT 21 10/14/2021 1356   ALKPHOS 86 04/02/2022 1132   BILITOT 0.5 04/02/2022 1132   BILITOT 0.8 10/14/2021 1356   GFRNONAA >60 04/02/2022 1132   GFRNONAA >60 10/14/2021 1356   GFRAA >60 08/05/2020 0955   GFRAA >60 06/04/2020 0830    ASSESSMENT and THERAPY PLAN:   Malignant neoplasm of upper-outer quadrant of left breast in female, estrogen receptor negative ([place]) Ms. Ehler is a 36 year old woman with 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 She is tolerating Keytruda very well.  Cough, shortness of breath have been stable to improved since last visit. Physical examination today without any concerns.  No adverse effects from [place].  She will proceed with immunotherapy as planned today.   Today is her last cycle of immunotherapy.  RTC in 4 weeks for tox check. Ok to remove port after evaluation at next visit.  All questions were answered. The patient knows to call the clinic with any problems, questions or concerns. We can certainly see the patient much sooner if necessary.  Total encounter time:20 minutes*in face-to-face visit time, chart review, lab review, care coordination, order entry, and documentation of the encounter time.  Benay Pike MD   *Total Encounter Time as defined by the Centers for Medicare and Medicaid Services includes, in addition to the face-to-face time of a patient visit (documented in the note above) non-face-to-face time: obtaining and reviewing outside history, ordering and reviewing medications,  tests or procedures, care coordination (communications with other health care professionals or caregivers) and documentation in the medical record.

## 2022-04-23 NOTE — Patient Instructions (Signed)
[facility]  Discharge Instructions: ?Thank you for choosing [facility] to provide your oncology and hematology care.  ? ?If you have a lab appointment with the Cancer Center, please go directly to the Cancer Center and check in at the registration area. ?  ?Wear comfortable clothing and clothing appropriate for easy access to any Portacath or PICC line.  ? ?We strive to give you quality time with your provider. You may need to reschedule your appointment if you arrive late (15 or more minutes).  Arriving late affects you and other patients whose appointments are after yours.  Also, if you miss three or more appointments without notifying the office, you may be dismissed from the clinic at the provider?s discretion.    ?  ?For prescription refill requests, have your pharmacy contact our office and allow 72 hours for refills to be completed.   ? ?Today you received the following chemotherapy and/or immunotherapy agent: Keytruda    ?  ?To help prevent nausea and vomiting after your treatment, we encourage you to take your nausea medication as directed. ? ?BELOW ARE SYMPTOMS THAT SHOULD BE REPORTED IMMEDIATELY: ?*FEVER GREATER THAN 100.4 F (38 ?C) OR HIGHER ?*CHILLS OR SWEATING ?*NAUSEA AND VOMITING THAT IS NOT CONTROLLED WITH YOUR NAUSEA MEDICATION ?*UNUSUAL SHORTNESS OF BREATH ?*UNUSUAL BRUISING OR BLEEDING ?*URINARY PROBLEMS (pain or burning when urinating, or frequent urination) ?*BOWEL PROBLEMS (unusual diarrhea, constipation, pain near the anus) ?TENDERNESS IN MOUTH AND THROAT WITH OR WITHOUT PRESENCE OF ULCERS (sore throat, sores in mouth, or a toothache) ?UNUSUAL RASH, SWELLING OR PAIN  ?UNUSUAL VAGINAL DISCHARGE OR ITCHING  ? ?Items with * indicate a potential emergency and should be followed up as soon as possible or go to the Emergency Department if any problems should occur. ? ?Please show the CHEMOTHERAPY ALERT CARD or IMMUNOTHERAPY ALERT CARD at check-in to  the Emergency Department and triage nurse. ? ?Should you have questions after your visit or need to cancel or reschedule your appointment, please contact [facility]  Dept: [phone]  and follow the prompts.  Office hours are 8:00 a.m. to 4:30 p.m. Monday - Friday. Please note that voicemails left after 4:00 p.m. may not be returned until the following business day.  We are closed weekends and major holidays. You have access to a nurse at all times for urgent questions. Please call the main number to the clinic Dept: [phone] and follow the prompts. ? ? ?For any non-urgent questions, you may also contact your provider using MyChart. We now offer e-Visits for anyone 18 and older to request care online for non-urgent symptoms. For details visit [email or web address]. ?  ?Also download the MyChart app! Go to the app store, search "MyChart", open the app, select , and log in with your MyChart username and password. ? ?Due to Covid, a mask is required upon entering the hospital/clinic. If you do not have a mask, one will be given to you upon arrival. For doctor visits, patients may have 1 support person aged 18 or older with them. For treatment visits, patients cannot have anyone with them due to current Covid guidelines and our immunocompromised population.  ? ?

## 2022-04-26 ENCOUNTER — Telehealth: Payer: Self-pay | Admitting: Hematology and Oncology

## 2022-04-26 NOTE — Telephone Encounter (Signed)
Scheduled appointment per 06/09 los. Patient aware.

## 2022-05-06 ENCOUNTER — Ambulatory Visit
Admission: RE | Admit: 2022-05-06 | Discharge: 2022-05-06 | Disposition: A | Discharge status: Home / Self Care | Payer: 59 | Source: Ambulatory Visit | Attending: Adult Health | Admitting: Adult Health

## 2022-05-06 DIAGNOSIS — C50412 Malignant neoplasm of upper-outer quadrant of left female breast: Secondary | ICD-10-CM

## 2022-05-20 ENCOUNTER — Inpatient Hospital Stay: Payer: 59 | Attending: Oncology | Admitting: Hematology and Oncology

## 2022-05-20 ENCOUNTER — Inpatient Hospital Stay: Payer: 59

## 2022-05-20 ENCOUNTER — Other Ambulatory Visit: Payer: Self-pay

## 2022-05-20 ENCOUNTER — Encounter: Payer: Self-pay | Admitting: Hematology and Oncology

## 2022-05-20 VITALS — BP 137/90 | HR 82 | Temp 98.1°F | Resp 16 | Ht 63.0 in | Wt 191.2 lb

## 2022-05-20 DIAGNOSIS — Z171 Estrogen receptor negative status [ER-]: Secondary | ICD-10-CM | POA: Insufficient documentation

## 2022-05-20 DIAGNOSIS — C50412 Malignant neoplasm of upper-outer quadrant of left female breast: Secondary | ICD-10-CM

## 2022-05-20 DIAGNOSIS — Z79899 Other long term (current) drug therapy: Secondary | ICD-10-CM | POA: Diagnosis not present

## 2022-05-20 DIAGNOSIS — Z801 Family history of malignant neoplasm of trachea, bronchus and lung: Secondary | ICD-10-CM | POA: Diagnosis not present

## 2022-05-20 DIAGNOSIS — Z8042 Family history of malignant neoplasm of prostate: Secondary | ICD-10-CM | POA: Insufficient documentation

## 2022-05-20 DIAGNOSIS — Z8041 Family history of malignant neoplasm of ovary: Secondary | ICD-10-CM | POA: Diagnosis not present

## 2022-05-20 DIAGNOSIS — G629 Polyneuropathy, unspecified: Secondary | ICD-10-CM | POA: Diagnosis not present

## 2022-05-20 LAB — CBC WITH DIFFERENTIAL/PLATELET
Abs Immature Granulocytes: 0 10*3/uL (ref 0.00–0.07)
Basophils Absolute: 0 10*3/uL (ref 0.0–0.1)
Basophils Relative: 1 %
Eosinophils Absolute: 0.4 10*3/uL (ref 0.0–0.5)
Eosinophils Relative: 7 %
HCT: 36.5 % (ref 36.0–46.0)
Hemoglobin: 12.3 g/dL (ref 12.0–15.0)
Immature Granulocytes: 0 %
Lymphocytes Relative: 45 %
Lymphs Abs: 2.2 10*3/uL (ref 0.7–4.0)
MCH: 28.7 pg (ref 26.0–34.0)
MCHC: 33.7 g/dL (ref 30.0–36.0)
MCV: 85.3 fL (ref 80.0–100.0)
Monocytes Absolute: 0.4 10*3/uL (ref 0.1–1.0)
Monocytes Relative: 8 %
Neutro Abs: 1.9 10*3/uL (ref 1.7–7.7)
Neutrophils Relative %: 39 %
Platelets: 275 10*3/uL (ref 150–400)
RBC: 4.28 MIL/uL (ref 3.87–5.11)
RDW: 14.8 % (ref 11.5–15.5)
WBC: 4.8 10*3/uL (ref 4.0–10.5)
nRBC: 0 % (ref 0.0–0.2)

## 2022-05-20 LAB — COMPREHENSIVE METABOLIC PANEL
ALT: 21 U/L (ref 0–44)
AST: 18 U/L (ref 15–41)
Albumin: 4.5 g/dL (ref 3.5–5.0)
Alkaline Phosphatase: 82 U/L (ref 38–126)
Anion gap: 5 (ref 5–15)
BUN: 18 mg/dL (ref 6–20)
CO2: 28 mmol/L (ref 22–32)
Calcium: 9.6 mg/dL (ref 8.9–10.3)
Chloride: 104 mmol/L (ref 98–111)
Creatinine, Ser: 0.99 mg/dL (ref 0.44–1.00)
GFR, Estimated: >60 mL/min (ref >60)
Glucose, Bld: 92 mg/dL (ref 70–99)
Potassium: 4 mmol/L (ref 3.5–5.1)
Sodium: 137 mmol/L (ref 135–145)
Total Bilirubin: 0.5 mg/dL (ref 0.3–1.2)
Total Protein: 7.7 g/dL (ref 6.5–8.1)

## 2022-05-20 LAB — TSH: TSH: 0.85 u[IU]/mL (ref 0.350–4.500)

## 2022-05-20 NOTE — Progress Notes (Signed)
Jeanne Kava, MD [address] [place] [address]   DIAGNOSIS:  Cancer Staging  Malignant neoplasm of upper-outer quadrant of left breast in female, estrogen receptor negative ([place]) Staging form: Breast, AJCC 8th Edition - Clinical stage from 06/04/2020: Stage IIB (cT2, cN0(f), cM0, G3, ER-, PR-, HER2-) - Unsigned Stage prefix: Initial diagnosis Method of lymph node assessment: Core biopsy Histologic grading system: 3 grade system   SUMMARY OF ONCOLOGIC HISTORY: 36 y.o. [name] woman status post left breast upper outer quadrant biopsy 05/29/2020 for a clinical T2 N0, stage IIB invasive ductal carcinoma, functionally triple negative, with an MIB-1 of 95%.   (1) genetics testing 06/23/2020 through the [facility] Multi-Cancer Panel found no deleterious mutations in AIP, ALK, APC, ATM, AXIN2,BAP1,  BARD1, BLM, BMPR1A, BRCA1, BRCA2, BRIP1, CASR, CDC73, CDH1, CDK4, CDKN1B, CDKN1C, CDKN2A (p14ARF), CDKN2A (p16INK4a), CEBPA, CHEK2, CTNNA1, DICER1, DIS3L2, EGFR (c.2369C>T, p.Thr790Met variant only), EPCAM (Deletion/duplication testing only), FH, FLCN, GATA2, GPC3, GREM1 (Promoter region deletion/duplication testing only), HOXB13 (c.251G>A, p.Gly84Glu), HRAS, KIT, MAX, MEN1, MET, MITF (c.952G>A, p.Glu318Lys variant only), MLH1, MSH2, MSH3, MSH6, MUTYH, NBN, NF1, NF2, NTHL1, PALB2, PDGFRA, PHOX2B, PMS2, POLD1, POLE, POT1, PRKAR1A, PTCH1, PTEN, RAD50, RAD51C, RAD51D, RB1, RECQL4, RET, RNF43, RUNX1, SDHAF2, SDHA (sequence changes only), SDHB, SDHC, SDHD, SMAD4, SMARCA4, SMARCB1, SMARCE1, STK11, SUFU, TERC, TERT, TMEM127, TP53, TSC1, TSC2, VHL, WRN and WT1.    (2) neoadjuvant chemotherapy consisting of cyclophosphamide and doxorubicin in dose dense fashion x4 started 06/19/2020, completed 08/05/2020, followed by paclitaxel and carboplatin weekly x12 starting 08/26/2020, completing 4 doses 09/23/2020             (a) echocardiogram on 06/13/2020 showed an EF of 60-65%             (b) paclitaxel  and carboplatin discontinued after 4 doses because of neuropathy and cytopenias   (3) status post left lumpectomy and sentinel lymph node sampling 11/19/2020 for a residual yp T1c ypN0 invasive ductal carcinoma involving the superior margin             (a) additional surgery 12/09/2020 cleared the margin in question             (b) repeat prognostic panel confirms triple negative disease             (c) status post left mastopexy and right breast reduction on 10/27/2021 with fat grafting bilaterally   (4) adjuvant radiation : 01/22/2021 through 03/10/2021 Site Technique Total Dose (Gy) Dose per Fx (Gy) Completed Fx Beam Energies  Breast, Left: Breast_Lt 3D 50.4/50.4 1.8 28/28 6X  Breast, Left: Breast_Lt_Bst 3D 10/10 2 5/5 6X              (a) received capecitabine sensitization, started 01/26/2021   (5) pembrolizumab/Keytruda began on 04/16/2021, held 05/11/2021 (after 2 doses) with persistent cough, later attributed to asthma (A) pembrolizumab resumed 08/06/2021 (B) PD-L1 combined score is 10   (6) molecular pathology from the 11/19/2020 sample 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  There were no mutations in BRCA1 or 2, ERBB2 or PIK3CA.  The microsatellite status was equivocal and the tumor mutational burden could not be determined on the sample.   (7) sarcoidosis? (followed by pulmonary: Jeanne Evans)             (A) chest CT 06/13/2020 shows in addition to the left breast mass multiple ill-defined pulmonary nodules, pleural nodularity and symmetrical by hilar and mediastinal adenopathy consistent with sarcoidosis. (B) chest CT 05/29/2021 shows stable mediastinal and hilar adenopathy  as well as improved liver lesions.  No evidence of metastatic disease. (C) CT angiogram of the chest on February 02, 2022 shows no evidence of pulmonary embolus and improvement in possible sarcoidosis.  No progressive or new nodularity.  Bronchial wall thickening in the lower lobes which can be seen with bronchitis or reactive  airway disease.   CURRENT THERAPY: Pembrolizumab  INTERVAL HISTORY:  Jeanne Evans 36 y.o. female returns for evaluation after completing adjuvant immunotherapy. She is doing quite well since she completed chemotherapy and immunotherapy except for some ongoing fatigue.  She continues to follow-up with allergist for management of her respiratory symptoms.  She continues to complain of severe feet pain especially when she stands up after sitting for a while.  Once she starts walking, the pain eases.  Rest of the pertinent 10 point ROS reviewed and negative  Patient Active Problem List   Diagnosis Date Noted   Port-A-Cath in place 03/11/2022   Acute respiratory failure with hypoxemia ([place]) 02/03/2022   Acute respiratory failure with hypoxia ([place]) 02/03/2022   Asthma 07/31/2021   Lymphedema of left arm 02/02/2021   Drug-induced neutropenia ([place]) 09/30/2020   Genetic testing 06/26/2020   Family history of ovarian cancer    Family history of prostate cancer    Malignant neoplasm of upper-outer quadrant of left breast in female, estrogen receptor negative ([place]) 06/03/2020    is allergic to sulfa antibiotics.  MEDICAL HISTORY: Past Medical History:  Diagnosis Date   Asthma 07/31/2021   Breast cancer ([place])    Family history of ovarian cancer    Family history of prostate cancer    GERD (gastroesophageal reflux disease)    History of asthma    has albuterol, uses PRN   History of heart murmur in childhood    History of multiple allergies     SURGICAL HISTORY: Past Surgical History:  Procedure Laterality Date   BREAST LUMPECTOMY WITH RADIOACTIVE SEED AND SENTINEL LYMPH NODE BIOPSY Left 11/19/2020   Procedure: LEFT BREAST LUMPECTOMY X 2 WITH RADIOACTIVE SEED AND SENTINEL LYMPH NODE MAPPING;  Surgeon: Erroll Luna, MD;  Location: [facility];  Service: General;  Laterality: Left;  PECTORAL BLOCK   BREAST REDUCTION SURGERY Right 10/27/2021   Procedure: MAMMARY  REDUCTION  TO RIGHT BREAST;  Surgeon: [name], MD;  Location: [facility];  Service: Plastics;  Laterality: Right;   LIPOSUCTION WITH LIPOFILLING Bilateral 10/27/2021   Procedure: BILATERAL FAT GRAFTING TO BREAST;  Surgeon: [name], MD;  Location: [facility];  Service: Plastics;  Laterality: Bilateral;   PORTACATH PLACEMENT Right 06/18/2020   Procedure: INSERTION PORT-A-CATH WITH ULTRASOUND GUIDANCE;  Surgeon: Erroll Luna, MD;  Location: [facility];  Service: General;  Laterality: Right;   RE-EXCISION OF BREAST LUMPECTOMY Left 12/09/2020   Procedure: RE-EXCISION OF LEFT BREAST LUMPECTOMY;  Surgeon: Erroll Luna, MD;  Location: [facility];  Service: General;  Laterality: Left;    SOCIAL HISTORY: Social History   Socioeconomic History   Marital status: Single    Spouse name: Not on file   Number of children: Not on file   Years of education: Not on file   Highest education level: Not on file  Occupational History   Not on file  Tobacco Use   Smoking status: Never   Smokeless tobacco: Never  Substance and Sexual Activity   Alcohol use: Yes    Comment: occas   Drug use: Yes  Types: Marijuana    Comment: daily   Sexual activity: Not on file  Other Topics Concern   Not on file  Social History Narrative   Not on file   Social Determinants of Health   Financial Resource Strain: High Risk (09/18/2021)   Overall Financial Resource Strain (CARDIA)    Difficulty of Paying Living Expenses: Very hard  Food Insecurity: Food Insecurity Present (09/18/2021)   Hunger Vital Sign    Worried About Running Out of Food in the Last Year: Sometimes true    Ran Out of Food in the Last Year: Sometimes true  Transportation Needs: No Transportation Needs (09/18/2021)   PRAPARE - Hydrologist (Medical): No    Lack of Transportation (Non-Medical): No  Physical Activity: Not on file  Stress:  Stress Concern Present (09/18/2021)   [place]    Feeling of Stress : Very much  Social Connections: Socially Isolated (09/18/2021)   Social Connection and Isolation Panel [NHANES]    Frequency of Communication with Friends and Family: More than three times a week    Frequency of Social Gatherings with Friends and Family: Once a week    Attends Religious Services: Never    Marine scientist or Organizations: No    Attends Music therapist: Never    Marital Status: Never married  Human resources officer Violence: Not on file    FAMILY HISTORY: Family History  Problem Relation Age of Onset   Lung cancer Maternal Uncle        dx late 68s   Prostate cancer Paternal Uncle        dx late 41s   Ovarian cancer Paternal Grandmother        dx 39s   Prostate cancer Paternal Grandfather        dx 52s   Prostate cancer Paternal Uncle        dx early 70s    Review of Systems  Constitutional:  Positive for fatigue. Negative for appetite change, chills, fever and unexpected weight change.  HENT:   Negative for hearing loss, lump/mass, mouth sores, sore throat and trouble swallowing.   Eyes:  Negative for eye problems and icterus.  Respiratory:  Negative for chest tightness, cough and shortness of breath.   Cardiovascular:  Negative for chest pain, leg swelling and palpitations.  Gastrointestinal:  Negative for abdominal distention, abdominal pain, constipation, diarrhea, nausea and vomiting.  Endocrine: Negative for hot flashes.  Genitourinary:  Negative for difficulty urinating.   Musculoskeletal:  Negative for arthralgias.  Skin:  Negative for itching and rash.  Neurological:  Negative for dizziness, extremity weakness, headaches and numbness.  Hematological:  Negative for adenopathy. Does not bruise/bleed easily.  Psychiatric/Behavioral:  Negative for depression. The patient is not nervous/anxious.        PHYSICAL EXAMINATION  ECOG PERFORMANCE STATUS: 1 - Symptomatic but completely ambulatory  Vitals:   05/20/22 1213  BP: 137/90  Pulse: 82  Resp: 16  Temp: 98.1 F (36.7 C)  SpO2: 100%    Physical Exam Constitutional:      General: She is not in acute distress.    Appearance: Normal appearance. She is not toxic-appearing.  HENT:     Head: Normocephalic and atraumatic.     Mouth/Throat:     Mouth: Mucous membranes are moist.  Eyes:     General: No scleral icterus. Cardiovascular:     Rate and Rhythm: Normal rate  and regular rhythm.     Pulses: Normal pulses.     Heart sounds: Normal heart sounds.  Pulmonary:     Effort: Pulmonary effort is normal.     Breath sounds: Normal breath sounds.  Abdominal:     General: Abdomen is flat. Bowel sounds are normal. There is no distension.     Palpations: Abdomen is soft.     Tenderness: There is no abdominal tenderness.  Musculoskeletal:        General: No swelling.     Cervical back: Neck supple.  Lymphadenopathy:     Cervical: No cervical adenopathy.  Skin:    General: Skin is warm and dry.     Findings: No rash.  Neurological:     Mental Status: She is alert.  Psychiatric:        Mood and Affect: Mood normal.        Behavior: Behavior normal.     LABORATORY DATA:  CBC    Component Value Date/Time   WBC 4.8 05/20/2022 1252   RBC 4.28 05/20/2022 1252   HGB 12.3 05/20/2022 1252   HGB 11.8 (L) 04/23/2022 0947   HCT 36.5 05/20/2022 1252   PLT 275 05/20/2022 1252   PLT 254 04/23/2022 0947   MCV 85.3 05/20/2022 1252   MCH 28.7 05/20/2022 1252   MCHC 33.7 05/20/2022 1252   RDW 14.8 05/20/2022 1252   LYMPHSABS 2.2 05/20/2022 1252   MONOABS 0.4 05/20/2022 1252   EOSABS 0.4 05/20/2022 1252   BASOSABS 0.0 05/20/2022 1252    CMP     Component Value Date/Time   NA 137 05/20/2022 1252   K 4.0 05/20/2022 1252   CL 104 05/20/2022 1252   CO2 28 05/20/2022 1252   GLUCOSE 92 05/20/2022 1252   BUN 18 05/20/2022  1252   CREATININE 0.99 05/20/2022 1252   CREATININE 1.03 (H) 10/14/2021 1356   CALCIUM 9.6 05/20/2022 1252   PROT 7.7 05/20/2022 1252   ALBUMIN 4.5 05/20/2022 1252   AST 18 05/20/2022 1252   AST 18 10/14/2021 1356   ALT 21 05/20/2022 1252   ALT 21 10/14/2021 1356   ALKPHOS 82 05/20/2022 1252   BILITOT 0.5 05/20/2022 1252   BILITOT 0.8 10/14/2021 1356   GFRNONAA >60 05/20/2022 1252   GFRNONAA >60 10/14/2021 1356   GFRAA >60 08/05/2020 0955   GFRAA >60 06/04/2020 0830    ASSESSMENT and THERAPY PLAN:   Malignant neoplasm of upper-outer quadrant of left breast in female, estrogen receptor negative ([place]) Ms. 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 She is tolerating Keytruda very well.  Cough, shortness of breath have been stable since last visit, she is working with her allergist as well.  Physical examination today without any adventitious sounds.  She continues to have some residual fatigue, otherwise no major adverse effects reported.  She understands that she will continue mammograms alternating with MRIs for breast cancer screening as recommended by radiology.  She was instructed to do self breast exams at least monthly and report any changes.  She will return to clinic in 6 months.  CBC and CMP from today reviewed and completely normal. I have sent a message to Dr. Brantley Stage about the port removal.  With regards to the foot pain, she was instructed to keep [place] posted if this does not improve in the next few weeks.  She may benefit from referral  to podiatry.  Total encounter time:30 minutes*in face-to-face visit time, chart review, lab review, care coordination, order entry, and documentation of the encounter time.  Benay Pike MD   *Total Encounter Time as defined by the Centers for Medicare and Medicaid Services  includes, in addition to the face-to-face time of a patient visit (documented in the note above) non-face-to-face time: obtaining and reviewing outside history, ordering and reviewing medications, tests or procedures, care coordination (communications with other health care professionals or caregivers) and documentation in the medical record.

## 2022-05-20 NOTE — Assessment & Plan Note (Signed)
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 She is tolerating Keytruda very well.  Cough, shortness of breath have been stable since last visit, she is working with her allergist as well.  Physical examination today without any adventitious sounds.  She continues to have some residual fatigue, otherwise no major adverse effects reported.  She understands that she will continue mammograms alternating with MRIs for breast cancer screening as recommended by radiology.  She was instructed to do self breast exams at least monthly and report any changes.  She will return to clinic in 6 months.  CBC and CMP from today reviewed and completely normal.

## 2022-05-28 ENCOUNTER — Encounter: Payer: Self-pay | Admitting: Hematology and Oncology

## 2022-06-04 ENCOUNTER — Ambulatory Visit (INDEPENDENT_AMBULATORY_CARE_PROVIDER_SITE_OTHER): Payer: 59 | Admitting: Internal Medicine

## 2022-06-04 ENCOUNTER — Encounter: Payer: Self-pay | Admitting: Internal Medicine

## 2022-06-04 VITALS — BP 120/80 | HR 80 | Temp 97.2°F | Resp 18 | Ht 62.0 in | Wt 191.6 lb

## 2022-06-04 DIAGNOSIS — J4551 Severe persistent asthma with (acute) exacerbation: Secondary | ICD-10-CM | POA: Diagnosis not present

## 2022-06-04 DIAGNOSIS — J3089 Other allergic rhinitis: Secondary | ICD-10-CM

## 2022-06-04 DIAGNOSIS — Z923 Personal history of irradiation: Secondary | ICD-10-CM

## 2022-06-04 DIAGNOSIS — K219 Gastro-esophageal reflux disease without esophagitis: Secondary | ICD-10-CM

## 2022-06-04 DIAGNOSIS — Z853 Personal history of malignant neoplasm of breast: Secondary | ICD-10-CM

## 2022-06-04 MED ORDER — TRELEGY ELLIPTA 200-62.5-25 MCG/ACT IN AEPB: 1.0000 | INHALATION_SPRAY | Freq: Every day | RESPIRATORY_TRACT | 6 refills | Status: DC

## 2022-06-04 MED ORDER — AZELASTINE HCL 0.1 % NA SOLN
2.0000 | Freq: Two times a day (BID) | NASAL | 6 refills | Status: DC
Start: 2022-06-04 — End: 2024-06-14

## 2022-06-04 MED ORDER — FLUTICASONE PROPIONATE 50 MCG/ACT NA SUSP
2.0000 | Freq: Every day | NASAL | 2 refills | Status: DC
Start: 2022-06-04 — End: 2024-06-14

## 2022-06-04 MED ORDER — LEVOCETIRIZINE DIHYDROCHLORIDE 5 MG PO TABS: 5.0000 mg | ORAL_TABLET | Freq: Every evening | ORAL | 1 refills | Status: DC

## 2022-06-04 MED ORDER — MONTELUKAST SODIUM 10 MG PO TABS: 10.0000 mg | ORAL_TABLET | Freq: Every day | ORAL | 5 refills | Status: DC

## 2022-06-04 NOTE — Progress Notes (Signed)
New Patient Note  RE: Jeanne Evans MRN: [id number] DOB: [birth year] Date of Office Visit: 06/04/2022  Consult requested by: Brand Males, MD Primary care provider: [name], MD  Chief Complaint: Allergy Testing and Cough  History of Present Illness: I had the pleasure of seeing Jeanne Evans for initial evaluation at the [facility] on 06/04/2022. She is a 36 y.o. female, who is referred here by [name], MD for the evaluation of asthma.  History obtained from patient  and  chart review .  Asthma:  Diagnosed at age 22 after breast cancer radiation treatment.  Current symptoms include chest tightness, cough, shortness of breath, and wheezing daily daytime symptoms in past month, 1-2 nighttime awakenings in past month Using rescue inhaler daily Limitations to daily activity: some 0 ED visits, 0 UC visits and 2 oral steroids in the past year 3/23 number of lifetime hospitalizations, 0 number of lifetime intubations.  Identified Triggers: exercise Prior PFTs or spirometry:  FeNO 03/11/22: 94ppb Previously used therapies: Breo 126mg and montelukast.  Current regimen:  Maintenance: Breo 1091m  Rescue: Albuterol 2 puffs q4-6 hrs PRN, not using prior to exercise AEC 300, IgE 134  sIgE   Up-to-date with pneumonia,, Covid-19,, and Flu, vaccines. History of prior pneumonias: denies  History of prior COVID-19 infection: denies  Smoking exposure: denies  She was seen by ENT and diagnosed with LPR, she has been on omeprazole '40mg'$  daily due to GERD induced by chemotherapy. This does not fully control her symptoms.    CTPA 3/23: IMPRESSION: 1. No pulmonary embolus. 2. 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 The right perihilar perifissural nodule is smaller than on prior exam. No new or progressive nodularity. 3. Unchanged hilar adenopathy, right greater than left. 4. Bronchial wall  thickening most prominent in the lower lobes, can be seen with bronchitis or reactive airways disease.   She has a history of breast cancer and recently completed chemotherapy and radiation.   She also has a history of perennial allergic rhinitis which manifest as nasla congestion, nasal pruritus and post nasal drip.  Denies ocular symptoms.  Denies animal triggers.   Previously treatments include claritin '10mg'$  daily, mucinex  07/07/21 >> Eos 300, IgE 134 , RAST pos cedar tree and timothy grass Assessment and Plan: [place] a [age].o. female with: Severe persistent asthma with acute exacerbation - Plan: Spirometry with Graph, Allergens w/Total IgE Area 2, CBC With Diff/Platelet, CANCELED: Spirometry with Graph  Gastroesophageal reflux disease without esophagitis  Other allergic rhinitis  History of breast cancer  History of external beam radiation therapy Plan: Patient Instructions  Severe Asthma: persistent not well controlled; with acute exacerbation - Breathing test today showed: significant inflammation in your lungs which did show some reversibility - Start prednisone pack today  -Sample provided for trelegy, start this today - This replaced [place] mouth out after use  -We will get labs to evaluate for biologics for asthma   PLAN:  - Spacer not needed with current regimen. - Daily controller medication(s):  Trelegy 2001mand Singulair '10mg'$  daily - Prior to physical activity: albuterol 2 puffs 10-15 minutes before physical activity. - Rescue medications: albuterol 4 puffs every 4-6 hours as needed  - Asthma control goals:  * Full participation in all desired activities (may need albuterol before activity) * Albuterol use two time or less a week on average (not counting use with activity) *  Cough interfering with sleep two time or less a month * Oral steroids no more than once a year * No hospitalizations    GERD  - Increase omeprazole to '40mg'$  twice daily   - Continue dietary and lifestyle modifications   Allergic Rhinitis: not well controlled  - Could not skin test today due to asthma, we will get blood work instead and consider skin testing once asthma is better controlled  - Continue with: Singulair (montelukast) '10mg'$  daily - Start taking: Xyzal (levocetirizine) '5mg'$  tablet once daily, Flonase (fluticasone) two sprays per nostril daily, and Astelin (azelastine) 2 sprays per nostril 1-2 times daily as needed - You can use an extra dose of the antihistamine, if needed, for breakthrough symptoms.  - Consider nasal saline rinses 1-2 times daily to remove allergens from the nasal cavities as well as help with mucous clearance (this is especially helpful to do before the nasal sprays are given) - Consider allergy shots as a means of long-term control and can reduce lifetime use of medications  - Allergy shots "re-train" and "reset" the immune system to ignore environmental allergens and decrease the resulting immune response to those allergens (sneezing, itchy watery eyes, runny nose, nasal congestion, etc).    - Allergy shots improve symptoms in 75-85%  - Allergy shots are the only potential permanent and disease modifying option  - We can discuss more at the next appointment if the medications are not working for you.  Follow up: 4 weeks   Thank you so much for letting me partake in your care today.  Don't hesitate to reach out if you have any additional concerns!  Roney Marion, MD  [facility] [place] [place]   No follow-ups on file.  Meds ordered this encounter  Medications   Fluticasone-Umeclidin-Vilant (TRELEGY ELLIPTA) 200-62.5-25 MCG/ACT AEPB    Sig: Inhale 1 puff into the lungs daily.    Dispense:  1 each    Refill:  6   levocetirizine (XYZAL) 5 MG tablet    Sig: Take 1 tablet (5 mg total) by mouth every evening.    Dispense:  90 tablet    Refill:  1   fluticasone (FLONASE) 50 MCG/ACT nasal spray    Sig: Place  2 sprays into both nostrils daily.    Dispense:  16 g    Refill:  2   azelastine (ASTELIN) 0.1 % nasal spray    Sig: Place 2 sprays into both nostrils 2 (two) times daily. Use in each nostril as directed    Dispense:  30 mL    Refill:  6   montelukast (SINGULAIR) 10 MG tablet    Sig: Take 1 tablet (10 mg total) by mouth at bedtime.    Dispense:  30 tablet    Refill:  5   Lab Orders         Allergens w/Total IgE Area 2         CBC With Diff/Platelet      Other allergy screening: Asthma: yes Rhino conjunctivitis: yes Food allergy: no Medication allergy: no Hymenoptera allergy: no Urticaria: no Eczema:yes History of recurrent infections suggestive of immunodeficency: no  Diagnostics: Spirometry:  Tracings reviewed. Her effort: Good reproducible efforts. FVC: 1.87L FEV1: 1.21L, 49% predicted FEV1/FVC ratio: 65% Interpretation: Spirometry consistent with mixed obstructive and restrictive disease. After 4 puffs of albuterol FEV1 increased by 260 cc, 21%; FVC increased by 240 cc and 13%; this is a significant postbronchodilator response Please see scanned spirometry results for  details.  Skin Testing:  deferred due to asthma  .  Results interpreted by myself and discussed with patient/family.   Past Medical History: Patient Active Problem List   Diagnosis Date Noted   Port-A-Cath in place 03/11/2022   Acute respiratory failure with hypoxemia ([place]) 02/03/2022   Acute respiratory failure with hypoxia ([place]) 02/03/2022   Asthma 07/31/2021   Lymphedema of left arm 02/02/2021   Drug-induced neutropenia ([place]) 09/30/2020   Genetic testing 06/26/2020   Family history of ovarian cancer    Family history of prostate cancer    Malignant neoplasm of upper-outer quadrant of left breast in female, estrogen receptor negative ([place]) 06/03/2020   Past Medical History:  Diagnosis Date   Asthma 07/31/2021   Breast cancer ([place])    Family history of ovarian cancer    Family history of  prostate cancer    GERD (gastroesophageal reflux disease)    History of asthma    has albuterol, uses PRN   History of heart murmur in childhood    History of multiple allergies    Past Surgical History: Past Surgical History:  Procedure Laterality Date   BREAST LUMPECTOMY WITH RADIOACTIVE SEED AND SENTINEL LYMPH NODE BIOPSY Left 11/19/2020   Procedure: LEFT BREAST LUMPECTOMY X 2 WITH RADIOACTIVE SEED AND SENTINEL LYMPH NODE MAPPING;  Surgeon: Erroll Luna, MD;  Location: [facility];  Service: General;  Laterality: Left;  PECTORAL BLOCK   BREAST REDUCTION SURGERY Right 10/27/2021   Procedure: MAMMARY REDUCTION  TO RIGHT BREAST;  Surgeon: [name], MD;  Location: [facility];  Service: Plastics;  Laterality: Right;   LIPOSUCTION WITH LIPOFILLING Bilateral 10/27/2021   Procedure: BILATERAL FAT GRAFTING TO BREAST;  Surgeon: [name], MD;  Location: [facility];  Service: Plastics;  Laterality: Bilateral;   PORTACATH PLACEMENT Right 06/18/2020   Procedure: INSERTION PORT-A-CATH WITH ULTRASOUND GUIDANCE;  Surgeon: Erroll Luna, MD;  Location: [facility];  Service: General;  Laterality: Right;   RE-EXCISION OF BREAST LUMPECTOMY Left 12/09/2020   Procedure: RE-EXCISION OF LEFT BREAST LUMPECTOMY;  Surgeon: Erroll Luna, MD;  Location: [facility];  Service: General;  Laterality: Left;   Medication List:  Current Outpatient Medications  Medication Sig Dispense Refill   albuterol (VENTOLIN HFA) 108 (90 Base) MCG/ACT inhaler Inhale 2 puffs into the lungs every 6 (six) hours as needed for wheezing or shortness of breath. 8 g 6   azelastine (ASTELIN) 0.1 % nasal spray Place 2 sprays into both nostrils 2 (two) times daily. Use in each nostril as directed 30 mL 6   fluticasone (FLONASE) 50 MCG/ACT nasal spray Place 2 sprays into both nostrils daily. 16 g 2   fluticasone furoate-vilanterol (BREO ELLIPTA) 100-25  MCG/ACT AEPB Inhale 1 puff into the lungs daily. 60 each 5   Fluticasone-Umeclidin-Vilant (TRELEGY ELLIPTA) 200-62.5-25 MCG/ACT AEPB Inhale 1 puff into the lungs daily. 1 each 6   gabapentin (NEURONTIN) 300 MG capsule Take 1 capsule (300 mg total) by mouth at bedtime. (Patient taking differently: Take 300 mg by mouth daily as needed (for nerve pain).) 90 capsule 4   levocetirizine (XYZAL) 5 MG tablet Take 1 tablet (5 mg total) by mouth every evening. 90 tablet 1   levonorgestrel (MIRENA, 52 MG,) 20 MCG/DAY IUD by Intrauterine route.     lidocaine-prilocaine (EMLA) cream Apply 1 application. topically as needed (for port access).     montelukast (SINGULAIR) 10 MG tablet Take 1 tablet (10 mg total)  by mouth at bedtime. 30 tablet 5   montelukast (SINGULAIR) 10 MG tablet Take 1 tablet (10 mg total) by mouth at bedtime. 30 tablet 5   omeprazole (PRILOSEC) 40 MG capsule Take 1 capsule (40 mg total) by mouth daily. First thing in the morning on an empty stomach 30 capsule 3   ondansetron (ZOFRAN-ODT) 4 MG disintegrating tablet Take 1 tablet (4 mg total) by mouth every 8 (eight) hours as needed for nausea or vomiting. 20 tablet 0   No current facility-administered medications for this visit.   Allergies: Allergies  Allergen Reactions   Sulfa Antibiotics Hives   Social History: Social History   Socioeconomic History   Marital status: Single    Spouse name: Not on file   Number of children: Not on file   Years of education: Not on file   Highest education level: Not on file  Occupational History   Not on file  Tobacco Use   Smoking status: Never    Passive exposure: Never   Smokeless tobacco: Never  Vaping Use   Vaping Use: Never used  Substance and Sexual Activity   Alcohol use: Yes    Comment: occas   Drug use: Yes    Types: Marijuana    Comment: daily   Sexual activity: Not on file  Other Topics Concern   Not on file  Social History Narrative   Not on file   Social  Determinants of Health   Financial Resource Strain: High Risk (09/18/2021)   Overall Financial Resource Strain (CARDIA)    Difficulty of Paying Living Expenses: Very hard  Food Insecurity: Food Insecurity Present (09/18/2021)   Hunger Vital Sign    Worried About Running Out of Food in the Last Year: Sometimes true    Ran Out of Food in the Last Year: Sometimes true  Transportation Needs: No Transportation Needs (09/18/2021)   PRAPARE - Hydrologist (Medical): No    Lack of Transportation (Non-Medical): No  Physical Activity: Not on file  Stress: Stress Concern Present (09/18/2021)   [place]    Feeling of Stress : Very much  Social Connections: Socially Isolated (09/18/2021)   Social Connection and Isolation Panel [NHANES]    Frequency of Communication with Friends and Family: More than three times a week    Frequency of Social Gatherings with Friends and Family: Once a week    Attends Religious Services: Never    Marine scientist or Organizations: No    Attends Archivist Meetings: Never    Marital Status: Never married   Lives in a single-family home that is built in 2022.  There are no roaches in the house and bed is 2 feet off the floor.  She is dust mite precautions on bed but not pillows.  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  She does have a HEPA filter in her home and home is not near an interstate industrial area. Smoking: No exposure Occupation: Custodian  Environmental HistoryFreight forwarder in the house: no Charity fundraiser in the family room: no Carpet in the bedroom: yes Heating: electric Cooling: central Pet: no  Family History: Family History  Problem Relation Age of Onset   Asthma Mother    Asthma Brother    Lung cancer Maternal Uncle        dx late 78s   Prostate cancer Paternal Uncle  dx late 60s   Prostate  cancer Paternal Uncle        dx early 61s   Ovarian cancer Paternal Grandmother        dx 14s   Prostate cancer Paternal Grandfather        dx 73s     ROS: All others negative except as noted per HPI.   Objective: BP 120/80 (BP Location: Right Arm, Patient Position: Sitting, Cuff Size: Normal)   Pulse 80   Temp (!) 97.2 F (36.2 C) (Temporal)   Resp 18   Ht '5\' 2"'$  (1.575 m)   Wt 191 lb 9.6 oz (86.9 kg)   SpO2 98%   BMI 35.04 kg/m  Body mass index is 35.04 kg/m.  General Appearance:  Alert, cooperative, no distress, appears stated age  Head:  Normocephalic, without obvious abnormality, atraumatic  Eyes:  Conjunctiva clear, EOM's intact  Nose: Nares normal,  erythematous nasal mucosa, hypertrophic turbinates, no visible anterior polyps, and septum midline  Throat: Lips, tongue normal; teeth and gums normal, + cobblestoning  Neck: Supple, symmetrical  Lungs:   clear to auscultation bilaterally, Respirations unlabored, no coughing, port on right chest   Heart:  regular rate and rhythm and no murmur, Appears well perfused  Extremities: No edema  Skin: Skin color, texture, turgor normal, no rashes or lesions on visualized portions of skin  Neurologic: No gross deficits   The plan was reviewed with the patient/family, and all questions/concerned were addressed.  It was my pleasure to see Jeanne Evans today and participate in her care. Please feel free to contact me with any questions or concerns.  Sincerely,  Roney Marion, MD Allergy & Immunology  [facility] office: [phone] [facility] office: [phone]

## 2022-06-04 NOTE — Patient Instructions (Addendum)
Severe Asthma: persistent not well controlled; with acute exacerbation - Breathing test today showed: significant inflammation in your lungs which did show some reversibility - Start prednisone pack today  -Sample provided for trelegy, start this today - This replaced [place] your mouth out after use  -We will get labs to evaluate for biologics for asthma   PLAN:  - Spacer not needed with current regimen. - Daily controller medication(s):  Trelegy 259mg and Singulair '10mg'$  daily - Prior to physical activity: albuterol 2 puffs 10-15 minutes before physical activity. - Rescue medications: albuterol 4 puffs every 4-6 hours as needed  - Asthma control goals:  * Full participation in all desired activities (may need albuterol before activity) * Albuterol use two time or less a week on average (not counting use with activity) * Cough interfering with sleep two time or less a month * Oral steroids no more than once a year * No hospitalizations    GERD  - Increase omeprazole to '40mg'$  twice daily  - Continue dietary and lifestyle modifications   Allergic Rhinitis: not well controlled  - Could not skin test today due to asthma, we will get blood work instead and consider skin testing once asthma is better controlled  - Continue with: Singulair (montelukast) '10mg'$  daily - Start taking: Xyzal (levocetirizine) '5mg'$  tablet once daily, Flonase (fluticasone) two sprays per nostril daily, and Astelin (azelastine) 2 sprays per nostril 1-2 times daily as needed - You can use an extra dose of the antihistamine, if needed, for breakthrough symptoms.  - Consider nasal saline rinses 1-2 times daily to remove allergens from the nasal cavities as well as help with mucous clearance (this is especially helpful to do before the nasal sprays are given) - Consider allergy shots as a means of long-term control and can reduce lifetime use of medications  - Allergy shots "re-train" and "reset" the immune system to  ignore environmental allergens and decrease the resulting immune response to those allergens (sneezing, itchy watery eyes, runny nose, nasal congestion, etc).    - Allergy shots improve symptoms in 75-85%  - Allergy shots are the only potential permanent and disease modifying option  - We can discuss more at the next appointment if the medications are not working for you.  Follow up: 4 weeks   Thank you so much for letting me partake in your care today.  Don't hesitate to reach out if you have any additional concerns!  ERoney Marion MD  [facility] [place]

## 2022-06-07 ENCOUNTER — Other Ambulatory Visit: Payer: Self-pay

## 2022-06-07 LAB — CBC WITH DIFF/PLATELET
Basophils Absolute: 0 10*3/uL (ref 0.0–0.2)
Basos: 1 %
EOS (ABSOLUTE): 0.5 10*3/uL — ABNORMAL HIGH (ref 0.0–0.4)
Eos: 10 %
Hematocrit: 40.1 % (ref 34.0–46.6)
Hemoglobin: 12.6 g/dL (ref 11.1–15.9)
Immature Grans (Abs): 0 10*3/uL (ref 0.0–0.1)
Immature Granulocytes: 0 %
Lymphocytes Absolute: 2.3 10*3/uL (ref 0.7–3.1)
Lymphs: 46 %
MCH: 27.7 pg (ref 26.6–33.0)
MCHC: 31.4 g/dL — ABNORMAL LOW (ref 31.5–35.7)
MCV: 88 fL (ref 79–97)
Monocytes Absolute: 0.4 10*3/uL (ref 0.1–0.9)
Monocytes: 8 %
Neutrophils Absolute: 1.7 10*3/uL (ref 1.4–7.0)
Neutrophils: 35 %
Platelets: 290 10*3/uL (ref 150–450)
RBC: 4.55 x10E6/uL (ref 3.77–5.28)
RDW: 14.5 % (ref 11.7–15.4)
WBC: 4.8 10*3/uL (ref 3.4–10.8)

## 2022-06-07 LAB — ALLERGENS W/TOTAL IGE AREA 2
Alternaria Alternata IgE: <0.1 kU/L
Aspergillus Fumigatus IgE: <0.1 kU/L
Bermuda Grass IgE: <0.1 kU/L
Cat Dander IgE: <0.1 kU/L
Cedar, Mountain IgE: 4.82 kU/L — AB
Cladosporium Herbarum IgE: <0.1 kU/L
Cockroach, German IgE: <0.1 kU/L
Common Silver Birch IgE: <0.1 kU/L
Cottonwood IgE: <0.1 kU/L
D Farinae IgE: <0.1 kU/L
D Pteronyssinus IgE: <0.1 kU/L
Dog Dander IgE: <0.1 kU/L
Elm, American IgE: <0.1 kU/L
IgE (Immunoglobulin E), Serum: 198 IU/mL (ref 6–495)
Johnson Grass IgE: <0.1 kU/L
Maple/Box Elder IgE: <0.1 kU/L
Mouse Urine IgE: <0.1 kU/L
Oak, White IgE: <0.1 kU/L
Pecan, Hickory IgE: <0.1 kU/L
Penicillium Chrysogen IgE: <0.1 kU/L
Pigweed, Rough IgE: <0.1 kU/L
Ragweed, Short IgE: <0.1 kU/L
Sheep Sorrel IgE Qn: <0.1 kU/L
Timothy Grass IgE: 2.34 kU/L — AB
White Mulberry IgE: <0.1 kU/L

## 2022-06-08 NOTE — Progress Notes (Signed)
Allergy testing returned only positive to timothy grass and mountain cedar.  Blood work returned with elevated eosinophils which would qualify her for biologic therapy for asthma.  At this point I want her to continue the trelegy and singulair and we can discuss at her follow up appointment on the 18th.  Thanks!

## 2022-06-30 IMAGING — MR MR BREAST BILAT WO/W CM
8 of 13 series · 32 of 48 positions shown · IV contrast (gadavist)
Comparison: Previous exam(s).

CLINICAL DATA: Patient had an ultrasound-guided core biopsy of the
left breast on 05/29/2020 which showed grade 3 invasive ductal
carcinoma involving the upper-outer quadrant of the left breast.
Patient had an MR guided core biopsy of the left breast for extent
of disease on 06/23/2020 which showed benign breast parenchyma with
mild nonspecific inflammation. Assess response to neoadjuvant
chemotherapy.

LABS:  None obtained at the time of imaging.
EXAM:
BILATERAL BREAST MRI WITH AND WITHOUT CONTRAST
TECHNIQUE: Multiplanar, multisequence MR images of both breasts were obtained
prior to and following the intravenous administration of 7 ml of
Gadavist

[Series 2: t2_tirm_tra ipat (a-p) · axial · 3.0mm · 0.70mm/px · 1 of 60 slices shown]
[im 1/60]
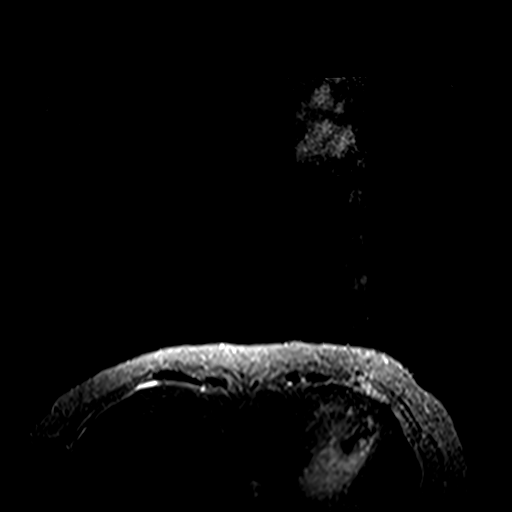

[Series 3: fl3d pre-cm no · axial · non-contrast · 1.2mm · 0.94mm/px · z∈[-84,+107]mm · 5 of 160 slices shown]
[im 1/160]
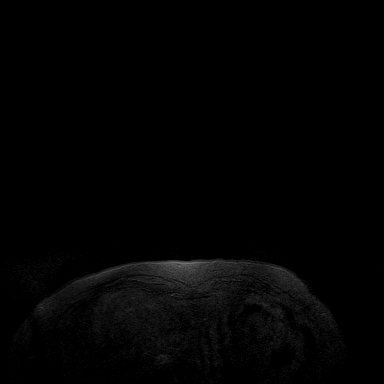
[im 40/160]
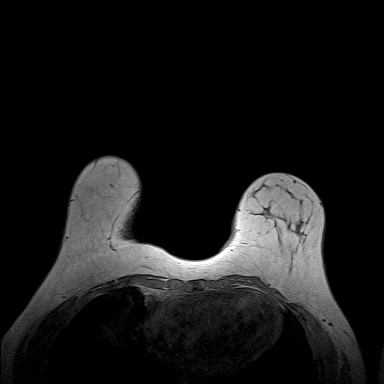
[im 80/160]
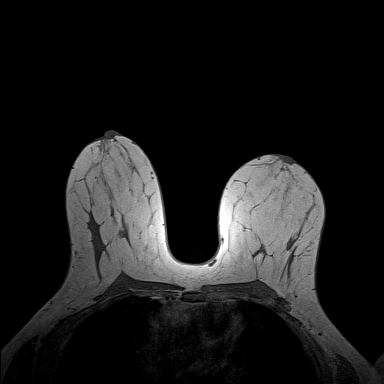
[im 120/160]
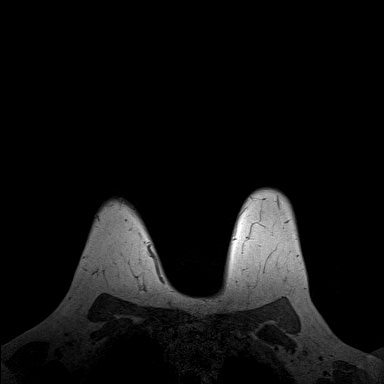
[im 160/160]
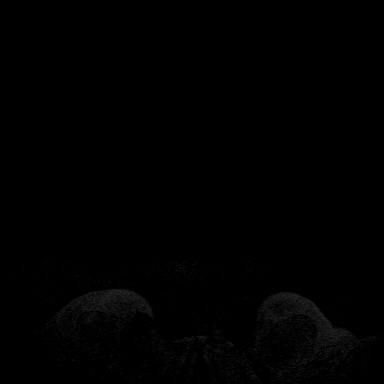

[Series 4: fl3d pre-cm · axial · non-contrast · 1.2mm · 0.94mm/px · z∈[-84,+107]mm · 5 of 160 slices shown]
[im 1/160]
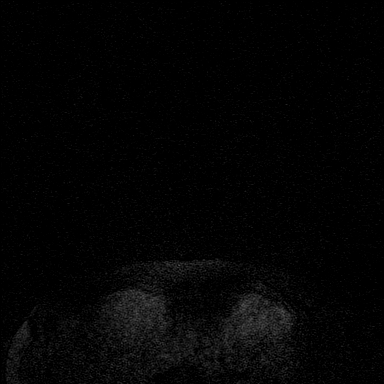
[im 40/160]
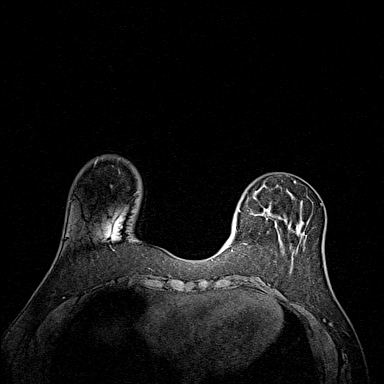
[im 80/160]
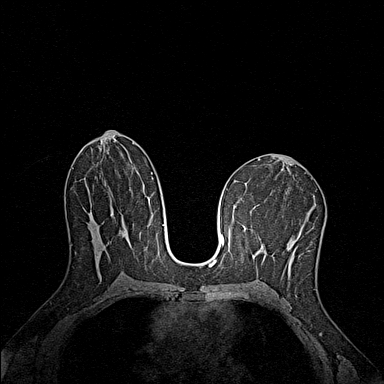
[im 120/160]
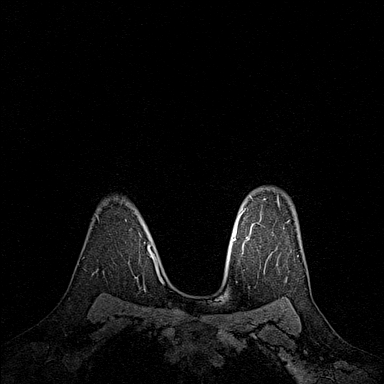
[im 160/160]
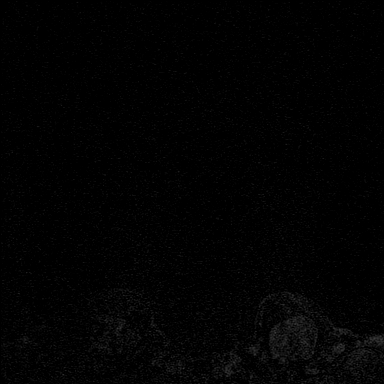

[Series 5: fl3d post-cm 20 · axial · 1.2mm · 0.94mm/px · z∈[-84,+107]mm · 5 of 160 slices shown (1 of 3)]
[im 1/160]
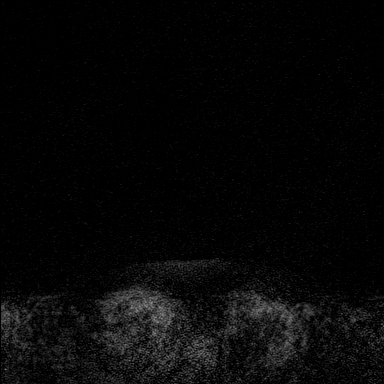
[im 40/160]
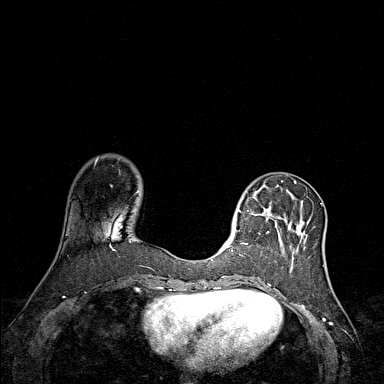
[im 80/160]
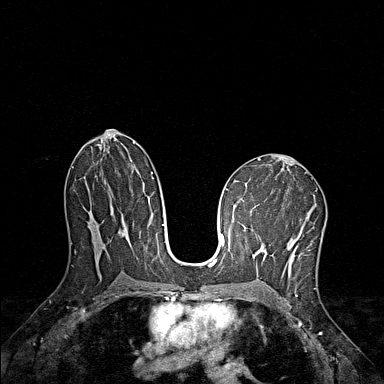
[im 120/160]
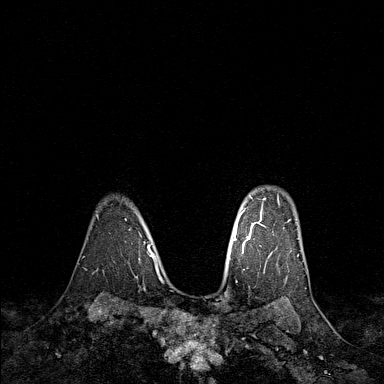
[im 160/160]
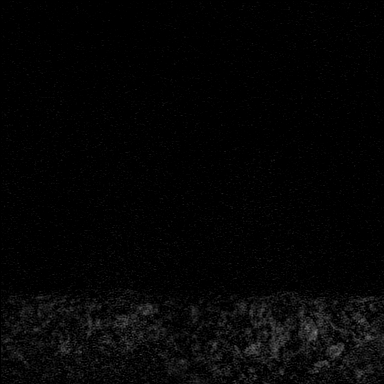

[Series 6: fl3d post-cm 20 · axial · 1.2mm · 0.94mm/px · z∈[-84,+107]mm · 5 of 160 slices shown (2 of 3)]
[im 1/160]
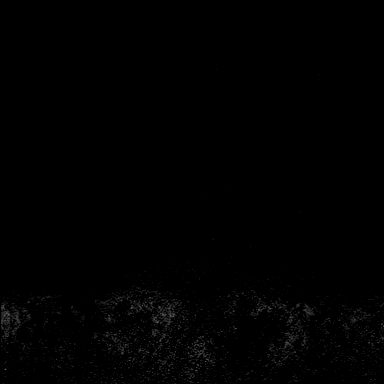
[im 40/160]
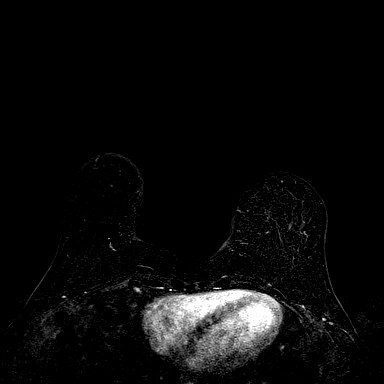
[im 80/160]
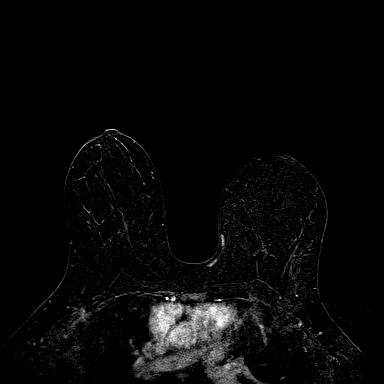
[im 120/160]
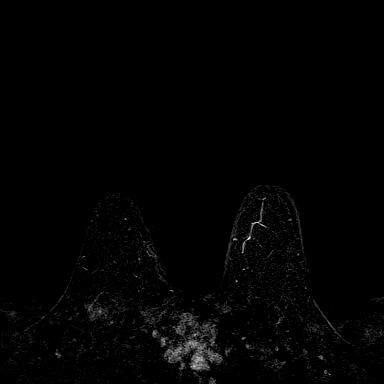
[im 160/160]
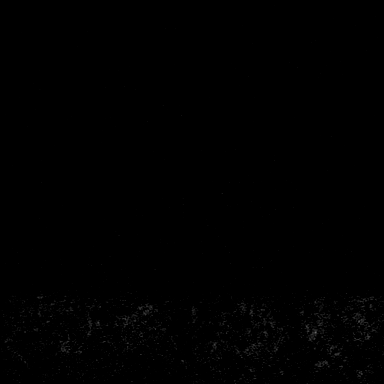

[Series 7: fl3d post-cm 20 · axial · 192.0mm · 0.94mm/px · 1 of 1 slices shown (3 of 3)]
[im 1/1]
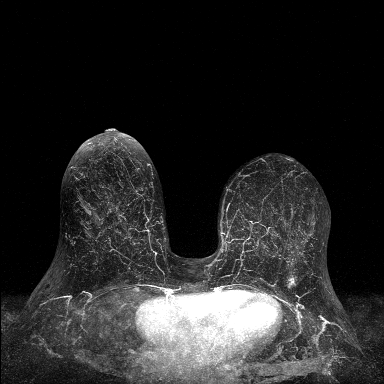

[Series 8: fl3d post-cm 3min · axial · 1.2mm · 0.94mm/px · z∈[-84,+107]mm · 5 of 160 slices shown]
[im 1/160]
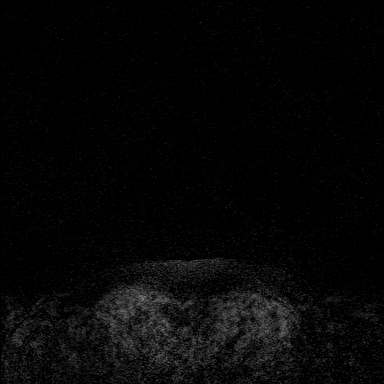
[im 40/160]
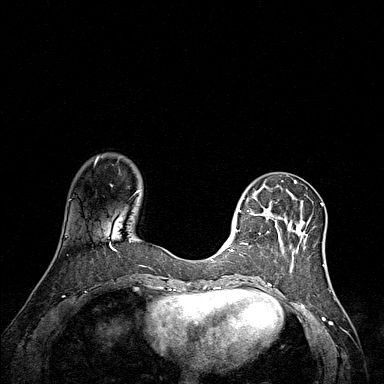
[im 80/160]
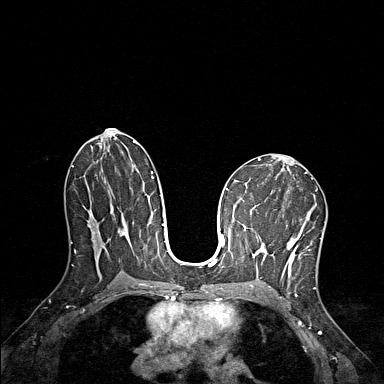
[im 120/160]
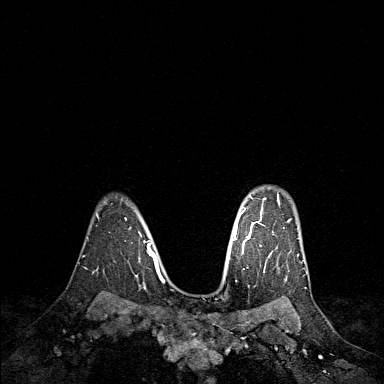
[im 160/160]
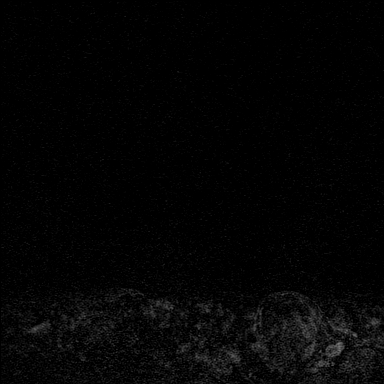

[Series 9: fl3d post-cm 3min_sub · axial · 1.2mm · 0.94mm/px · z∈[-84,+68]mm · 5 of 160 slices shown]
[im 1/160]
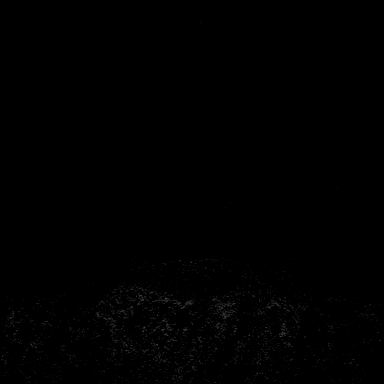
[im 32/160]
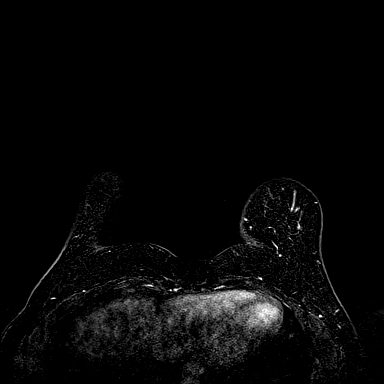
[im 64/160]
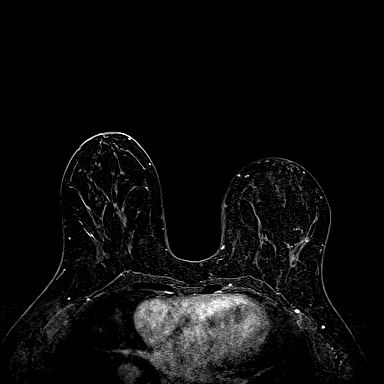
[im 96/160]
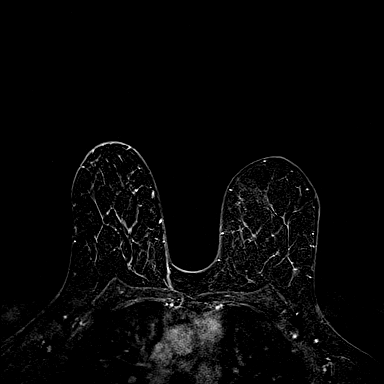
[im 128/160]
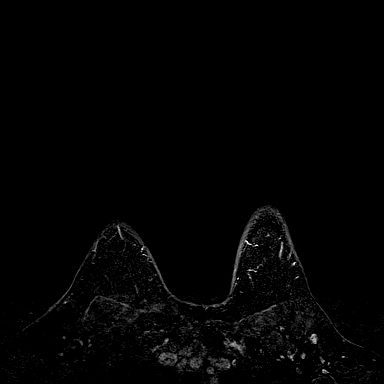

[32 of 48 positions shown; findings below may reference images not displayed]

Three-dimensional MR images were rendered by post-processing of the
original MR data on an independent workstation. The
three-dimensional MR images were interpreted, and findings are
reported in the following complete MRI report for this study. Three
dimensional images were evaluated at the independent interpreting
workstation using the DynaCAD thin client.
FINDINGS: Breast composition: c. Heterogeneous fibroglandular tissue.

Background parenchymal enhancement: Moderate.

Right breast: No mass or abnormal enhancement. Right-sided
Port-A-Cath.

Left breast: Far posteriorly in the lateral aspect of the left
breast is an enhancing mass measuring 1.1 x 0.7 x 1.0 cm with a
signal void artifact from the biopsy marker clip (series 6 image
88/160). On the prior MRI dated 06/16/2020 it measured 3.5 x 3.4 x
3.9 cm. Extending 3.8 cm anterior to the known malignancy is a
signal void artifact from the second biopsy marker clip where
nonspecific inflammation was biopsied. Non masslike enhancement is
seen extending in between the 2 clips concerning for extension of
disease (series 6, image 88/160).

Lymph nodes: No abnormal appearing lymph nodes.

Ancillary findings:  None.
IMPRESSION: Significant interval decrease in size of the known malignancy in the
left breast. There is contiguous 3.8 cm non masslike enhancement
extending between the 2 biopsy marker clips concerning for anterior
extent of disease.

RECOMMENDATION:
Continued treatment planning of the known grade 3 invasive ductal
carcinoma in the upper-outer quadrant of the left breast is
recommended. If the patient is going to undergo conservative therapy
I would recommend bracketed surgical excision of both clips in the
lateral aspect of the left breast.

BI-RADS CATEGORY  6: Known biopsy-proven malignancy.

## 2022-07-02 ENCOUNTER — Ambulatory Visit (INDEPENDENT_AMBULATORY_CARE_PROVIDER_SITE_OTHER): Payer: 59 | Admitting: Internal Medicine

## 2022-07-02 ENCOUNTER — Encounter: Payer: Self-pay | Admitting: Internal Medicine

## 2022-07-02 VITALS — BP 150/70 | HR 85 | Temp 98.4°F | Resp 16 | Wt 197.6 lb

## 2022-07-02 DIAGNOSIS — J3089 Other allergic rhinitis: Secondary | ICD-10-CM

## 2022-07-02 DIAGNOSIS — L439 Lichen planus, unspecified: Secondary | ICD-10-CM | POA: Diagnosis not present

## 2022-07-02 DIAGNOSIS — H1045 Other chronic allergic conjunctivitis: Secondary | ICD-10-CM

## 2022-07-02 DIAGNOSIS — K219 Gastro-esophageal reflux disease without esophagitis: Secondary | ICD-10-CM

## 2022-07-02 DIAGNOSIS — H1013 Acute atopic conjunctivitis, bilateral: Secondary | ICD-10-CM | POA: Diagnosis not present

## 2022-07-02 DIAGNOSIS — J4550 Severe persistent asthma, uncomplicated: Secondary | ICD-10-CM | POA: Diagnosis not present

## 2022-07-02 MED ORDER — CLOBETASOL PROPIONATE 0.05 % EX OINT
1.0000 | TOPICAL_OINTMENT | Freq: Two times a day (BID) | CUTANEOUS | 0 refills | Status: DC
Start: 2022-07-02 — End: 2022-10-22

## 2022-07-02 MED ORDER — DUPILUMAB 200 MG/1.14ML ~~LOC~~ SOSY
200.0000 mg | PREFILLED_SYRINGE | SUBCUTANEOUS | Status: DC
  Administered 2022-07-02: 200 mg via SUBCUTANEOUS

## 2022-07-02 MED ORDER — RYALTRIS 665-25 MCG/ACT NA SUSP
1.0000 | Freq: Every day | NASAL | 3 refills | Status: DC
Start: 2022-07-02 — End: 2022-07-02

## 2022-07-02 MED ORDER — RYALTRIS 665-25 MCG/ACT NA SUSP: 1.0000 | Freq: Every day | NASAL | 5 refills | Status: DC

## 2022-07-02 NOTE — Progress Notes (Signed)
Immunotherapy   Patient Details  Name: Jeanne Evans MRN: [id number] Date of Birth: January 21, 1986  07/02/2022  Jeanne Evans  received a Dupixent injection sample today. Patient received 400 mg loading dose and will be coming in every 2 weeks. Consent signed and patient instructions given.   Jeanne Evans 07/02/2022, 12:07 PM

## 2022-07-02 NOTE — Progress Notes (Signed)
Follow Up Note  RE: Jeanne Evans MRN: [id number] DOB: 10/07/1986 Date of Office Visit: 07/02/2022  Referring provider: [name], MD Primary care provider: [name], MD  Chief Complaint: Follow-up (Last week and a half been coughing up mucous (clear and thick). Can hear it in her chest. Asthma: using daily inhaler and using rescue 3 to 4 times per week. Using rescue inhaler more due to the heat.) and Other (Dry spots on legs (itchy). Started a couple of weeks before last visit but forgot to mention it at the last appointment.)  History of Present Illness: I had the pleasure of seeing Jeanne Evans for a follow up visit at the [facility] on 07/02/2022. She is a 36 y.o. female, who is being followed for severe persistent asthma, allergic rhinitis. Her previous allergy office visit was on 06/04/2022 with Dr. Edison Pace. Today is a regular follow up visit.  History obtained from patient, chart review.  ASTHMA - Medical therapy: Trelegy 220mg, singulair '10mg'$  daily  - Rescue inhaler use: 3-4 times a week  - Symptoms: initially good response to the trelegy, however over the past week she has noticed increase dyspnea and increased mucus.   - Exacerbation history: 0 ABX for respiratory illness since last visit, 3 OCS, 0ED, 3 UC visits in the past year  - ACT: 15 /25 - Adverse effects of medication: denies  - Previous FEV1: 1.21 L, 49%, with significant reversibility  - Biologic Labs total IgE 198, no perennial sensitizations, AEC 500   Allergic rhinitis: current therapy: Levocetirizine 5 mg daily, Flonase 2 sprays per nostril daily, Astelin 2 sprays per nostril 1-2 times daily as needed,  symptoms partially improved symptoms include: nasal congestion and post nasal drainage Previous allergy testing:  06/04/2022: Specific IgE positive to [name] grass and mountain cedar History of reflux/heartburn:  Yes omeprazole increased to 40 mg twice daily at last  visit Interested in Allergy Immunotherapy: no  Also reports 6-week history of persistent purple itchy patches on right knee.  Has treated with her daughters topical steroids.  Assessment and Plan: CAnnikais a 36y.o. female with: Severe persistent asthma without complication  Other allergic rhinitis  Other chronic allergic conjunctivitis of both eyes  Lichen planus  Gastroesophageal reflux disease without esophagitis Plan: Patient Instructions  Severe Asthma: persistent  - Breathing test today showed: - Biologic Labs 05/2022: AEC 500, IgE 198 without perennial sensitizations  -Will start Dupixent, 400 mg loading dose given today in clinic as a sample   - Consent signed today in clinic  - Our  biologic coordinator will reach out to you in regards to insurance coverage  PLAN:  - Spacer not needed with current regimen. - Daily controller medication(s):  Trelegy 2059m and Singulair '10mg'$  daily - Prior to physical activity: albuterol 2 puffs 10-15 minutes before physical activity. - Rescue medications: albuterol 4 puffs every 4-6 hours as needed  - Asthma control goals:  * Full participation in all desired activities (may need albuterol before activity) * Albuterol use two time or less a week on average (not counting use with activity) * Cough interfering with sleep two time or less a month * Oral steroids no more than once a year * No hospitalizations    GERD  - Continue omeprazole to '40mg'$  twice daily  - Continue dietary and lifestyle modifications   Allergic Rhinitis: slightly improved  - Previous testing:  06/04/2022: Specific IgE positive to [name] grass and mountain cedar -  Continue with: Xyzal (levocetirizine) '5mg'$  tablet once daily, Singulair (montelukast) '10mg'$  daily, Flonase (fluticasone) two sprays per nostril daily, and Astelin (azelastine) 2 sprays per nostril 1-2 times daily as needed - Start taking:  ryaltris 1 spray per nostril daily (if you like this better,  this can replace the flonase and astelin)  - You can use an extra dose of the antihistamine, if needed, for breakthrough symptoms.  - Consider nasal saline rinses 1-2 times daily to remove allergens from the nasal cavities as well as help with mucous clearance (this is especially helpful to do before the nasal sprays are given) - Consider allergy shots as a means of long-term control and can reduce lifetime use of medications  - Allergy shots "re-train" and "reset" the immune system to ignore environmental allergens and decrease the resulting immune response to those allergens (sneezing, itchy watery eyes, runny nose, nasal congestion, etc).    - Allergy shots improve symptoms in 75-85%  - Allergy shots are the only potential permanent and disease modifying option  - We can discuss more at the next appointment if the medications are not working for you.  Lichen planus  -May be related to omeprazole use, but would like to continue GERD treatment -We will focus on treatment with topical steroids -Start Clobetasol 0.5% ointment twice a day as needed to control symptoms  Follow up: 3 months   Thank you so much for letting me partake in your care today.  Don't hesitate to reach out if you have any additional concerns!  Roney Marion, MD  [facility] [place] [place]  No follow-ups on file.  Meds ordered this encounter  Medications   DISCONTD: Olopatadine-Mometasone (RYALTRIS) 665-25 MCG/ACT SUSP    Sig: Place 1 spray into the nose daily.    Dispense:  29 g    Refill:  3   clobetasol ointment (TEMOVATE) 0.05 %    Sig: Apply 1 Application topically 2 (two) times daily.    Dispense:  30 g    Refill:  0   Olopatadine-Mometasone (RYALTRIS) 665-25 MCG/ACT SUSP    Sig: Place 1 spray into the nose daily.    Dispense:  29 g    Refill:  5   dupilumab (DUPIXENT) prefilled syringe 200 mg    Lab Orders  No laboratory test(s) ordered today   Diagnostics: Spirometry:   Tracings reviewed. Her effort: Good reproducible efforts. FVC: 2.31 L FEV1: 1.67 L, 68% predicted FEV1/FVC ratio: 72% Interpretation: Spirometry consistent with moderate obstructive disease.  Please see scanned spirometry results for details.   Results interpreted by myself during this encounter and discussed with patient/family.   Medication List:  Current Outpatient Medications  Medication Sig Dispense Refill   albuterol (VENTOLIN HFA) 108 (90 Base) MCG/ACT inhaler Inhale 2 puffs into the lungs every 6 (six) hours as needed for wheezing or shortness of breath. 8 g 6   azelastine (ASTELIN) 0.1 % nasal spray Place 2 sprays into both nostrils 2 (two) times daily. Use in each nostril as directed 30 mL 6   clobetasol ointment (TEMOVATE) 4.43 % Apply 1 Application topically 2 (two) times daily. 30 g 0   fluticasone (FLONASE) 50 MCG/ACT nasal spray Place 2 sprays into both nostrils daily. 16 g 2   Fluticasone-Umeclidin-Vilant (TRELEGY ELLIPTA) 200-62.5-25 MCG/ACT AEPB Inhale 1 puff into the lungs daily. 1 each 6   gabapentin (NEURONTIN) 300 MG capsule Take 1 capsule (300 mg total) by mouth at bedtime. (Patient taking differently: Take 300  mg by mouth daily as needed (for nerve pain).) 90 capsule 4   levocetirizine (XYZAL) 5 MG tablet Take 1 tablet (5 mg total) by mouth every evening. 90 tablet 1   levonorgestrel (MIRENA, 52 MG,) 20 MCG/DAY IUD by Intrauterine route.     lidocaine-prilocaine (EMLA) cream Apply 1 application. topically as needed (for port access).     montelukast (SINGULAIR) 10 MG tablet Take 1 tablet (10 mg total) by mouth at bedtime. 30 tablet 5   montelukast (SINGULAIR) 10 MG tablet Take 1 tablet (10 mg total) by mouth at bedtime. 30 tablet 5   omeprazole (PRILOSEC) 40 MG capsule Take 1 capsule (40 mg total) by mouth daily. First thing in the morning on an empty stomach 30 capsule 3   ondansetron (ZOFRAN-ODT) 4 MG disintegrating tablet Take 1 tablet (4 mg total) by mouth  every 8 (eight) hours as needed for nausea or vomiting. 20 tablet 0   fluticasone furoate-vilanterol (BREO ELLIPTA) 100-25 MCG/ACT AEPB Inhale 1 puff into the lungs daily. (Patient not taking: Reported on 07/02/2022) 60 each 5   Olopatadine-Mometasone (RYALTRIS) 665-25 MCG/ACT SUSP Place 1 spray into the nose daily. 29 g 5   Current Facility-Administered Medications  Medication Dose Route Frequency Provider Last Rate Last Admin   dupilumab (DUPIXENT) prefilled syringe 200 mg  200 mg Subcutaneous Q14 Days Roney Marion, MD   200 mg at 07/02/22 1204   Allergies: Allergies  Allergen Reactions   Sulfa Antibiotics Hives   I reviewed her past medical history, social history, family history, and environmental history and no significant changes have been reported from her previous visit.  ROS: All others negative except as noted per HPI.   Objective: BP (!) 150/70   Pulse 85   Temp 98.4 F (36.9 C) (Temporal)   Resp 16   Wt 197 lb 9.6 oz (89.6 kg)   SpO2 96%   BMI 36.14 kg/m  Body mass index is 36.14 kg/m. General Appearance:  Alert, cooperative, no distress, appears stated age  Head:  Normocephalic, without obvious abnormality, atraumatic  Eyes:  Conjunctiva clear, EOM's intact  Nose: Nares normal, hypertrophic turbinates, no visible anterior polyps, and septum midline  Throat: Lips, tongue normal; teeth and gums normal, no tonsillar exudate and + cobblestoning  Neck: Supple, symmetrical  Lungs:   clear to auscultation bilaterally, Respirations unlabored, intermittent dry coughing  Heart:  regular rate and rhythm and no murmur, Appears well perfused  Extremities: No edema  Skin: Skin color, texture, turgor normal, no rashes or lesions on visualized portions of skin   Neurologic: No gross deficits   Previous notes and tests were reviewed. The plan was reviewed with the patient/family, and all questions/concerned were addressed.  It was my pleasure to see Jeanne Evans today and  participate in her care. Please feel free to contact me with any questions or concerns.  Sincerely,  Roney Marion, MD  Allergy & Immunology  [facility] Office: [phone]

## 2022-07-02 NOTE — Patient Instructions (Addendum)
Severe Asthma: persistent  - Breathing test today showed: - Biologic Labs 05/2022: AEC 500, IgE 198 without perennial sensitizations  -Will start Dupixent, 400 mg loading dose given today in clinic as a sample   - Consent signed today in clinic  - Our  biologic coordinator will reach out to you in regards to insurance coverage  PLAN:  - Spacer not needed with current regimen. - Daily controller medication(s):  Trelegy 273mg and Singulair '10mg'$  daily - Prior to physical activity: albuterol 2 puffs 10-15 minutes before physical activity. - Rescue medications: albuterol 4 puffs every 4-6 hours as needed  - Asthma control goals:  * Full participation in all desired activities (may need albuterol before activity) * Albuterol use two time or less a week on average (not counting use with activity) * Cough interfering with sleep two time or less a month * Oral steroids no more than once a year * No hospitalizations    GERD  - Continue omeprazole to '40mg'$  twice daily  - Continue dietary and lifestyle modifications   Allergic Rhinitis: slightly improved  - Previous testing:  06/04/2022: Specific IgE positive to [name] grass and mountain cedar - Continue with: Xyzal (levocetirizine) '5mg'$  tablet once daily, Singulair (montelukast) '10mg'$  daily, Flonase (fluticasone) two sprays per nostril daily, and Astelin (azelastine) 2 sprays per nostril 1-2 times daily as needed - Start taking:  ryaltris 1 spray per nostril daily (if you like this better, this can replace the flonase and astelin)  - You can use an extra dose of the antihistamine, if needed, for breakthrough symptoms.  - Consider nasal saline rinses 1-2 times daily to remove allergens from the nasal cavities as well as help with mucous clearance (this is especially helpful to do before the nasal sprays are given) - Consider allergy shots as a means of long-term control and can reduce lifetime use of medications  - Allergy shots "re-train" and  "reset" the immune system to ignore environmental allergens and decrease the resulting immune response to those allergens (sneezing, itchy watery eyes, runny nose, nasal congestion, etc).    - Allergy shots improve symptoms in 75-85%  - Allergy shots are the only potential permanent and disease modifying option  - We can discuss more at the next appointment if the medications are not working for you.  Lichen planus  -May be related to omeprazole use, but would like to continue GERD treatment -We will focus on treatment with topical steroids -Start Clobetasol 0.5% ointment twice a day as needed to control symptoms  Follow up: 3 months   Thank you so much for letting me partake in your care today.  Don't hesitate to reach out if you have any additional concerns!  ERoney Marion MD  [facility] [place]

## 2022-07-02 NOTE — Addendum Note (Signed)
Addended by: Eloy End D on: 07/02/2022 04:52 PM   Modules accepted: Orders

## 2022-07-07 ENCOUNTER — Telehealth: Payer: Self-pay | Admitting: *Deleted

## 2022-07-07 NOTE — Telephone Encounter (Signed)
Spoke to patient and advised approval and submit for Dupixent. Patient wants to get injections in clinic and I will let her know when delivery set to make appt for next injection

## 2022-07-07 NOTE — Telephone Encounter (Signed)
Thanks

## 2022-07-07 NOTE — Telephone Encounter (Signed)
L/m for patient to contact me to advise approval, copay card to be emailed to her and submit to [facility] for [place]

## 2022-07-09 ENCOUNTER — Telehealth: Payer: Self-pay | Admitting: *Deleted

## 2022-07-09 NOTE — Telephone Encounter (Signed)
PA was denied for Ryaltris. Preferred alternatives are fluticasone nasal spray, azelastine 0.1% nasal spray, ipratropium nasal spray and olopatadine nasal spray. Please advise change in nasal spray.

## 2022-07-09 NOTE — Telephone Encounter (Signed)
PA has been submitted through CoverMyMeds for Ryaltris and is currently pending approval/denial.  

## 2022-07-14 NOTE — Telephone Encounter (Signed)
Called and left a voicemail asking patient to return call to inform and to confirm pharmacy to send new nasal sprays to.

## 2022-07-16 NOTE — Telephone Encounter (Signed)
Called and spoke with patient and she stated that she has tried and failed the flonase and azelastine but she will use them for now. She stated that the Ryaltris was going to be $49 with her insurance. Do you recommend the ipratropium or olopatadine nasal sprays?

## 2022-07-20 ENCOUNTER — Other Ambulatory Visit: Payer: Self-pay | Admitting: *Deleted

## 2022-07-20 MED ORDER — IPRATROPIUM BROMIDE 0.06 % NA SOLN: NASAL | 5 refills | Status: AC

## 2022-07-20 NOTE — Telephone Encounter (Signed)
We can add ipatropium to flonase and astelin (1 spray per nostril three times a day as needed for runny nose).  Thanks!

## 2022-07-20 NOTE — Telephone Encounter (Signed)
Called and spoke with patient and advised of nasal spray, patient verbalized understanding. Confirmed pharmacy and sent in prescription.

## 2022-07-29 ENCOUNTER — Ambulatory Visit: Payer: 59

## 2022-07-30 ENCOUNTER — Ambulatory Visit (INDEPENDENT_AMBULATORY_CARE_PROVIDER_SITE_OTHER): Payer: 59 | Admitting: *Deleted

## 2022-07-30 DIAGNOSIS — J4550 Severe persistent asthma, uncomplicated: Secondary | ICD-10-CM | POA: Diagnosis not present

## 2022-07-30 MED ORDER — DUPILUMAB 300 MG/2ML ~~LOC~~ SOSY
300.0000 mg | PREFILLED_SYRINGE | SUBCUTANEOUS | Status: AC
  Administered 2022-07-30 – 2024-12-20 (×59): 300 mg via SUBCUTANEOUS

## 2022-08-13 ENCOUNTER — Ambulatory Visit (INDEPENDENT_AMBULATORY_CARE_PROVIDER_SITE_OTHER): Payer: 59

## 2022-08-13 DIAGNOSIS — J4550 Severe persistent asthma, uncomplicated: Secondary | ICD-10-CM

## 2022-08-27 ENCOUNTER — Ambulatory Visit (INDEPENDENT_AMBULATORY_CARE_PROVIDER_SITE_OTHER): Payer: 59 | Admitting: *Deleted

## 2022-08-27 DIAGNOSIS — J4550 Severe persistent asthma, uncomplicated: Secondary | ICD-10-CM | POA: Diagnosis not present

## 2022-09-09 ENCOUNTER — Ambulatory Visit (INDEPENDENT_AMBULATORY_CARE_PROVIDER_SITE_OTHER): Payer: 59

## 2022-09-09 DIAGNOSIS — J4550 Severe persistent asthma, uncomplicated: Secondary | ICD-10-CM

## 2022-09-27 ENCOUNTER — Ambulatory Visit (INDEPENDENT_AMBULATORY_CARE_PROVIDER_SITE_OTHER): Payer: 59

## 2022-09-27 DIAGNOSIS — J4550 Severe persistent asthma, uncomplicated: Secondary | ICD-10-CM | POA: Diagnosis not present

## 2022-10-14 ENCOUNTER — Ambulatory Visit (INDEPENDENT_AMBULATORY_CARE_PROVIDER_SITE_OTHER): Payer: 59

## 2022-10-14 DIAGNOSIS — J4550 Severe persistent asthma, uncomplicated: Secondary | ICD-10-CM

## 2022-10-15 ENCOUNTER — Ambulatory Visit: Payer: 59 | Admitting: Internal Medicine

## 2022-10-22 ENCOUNTER — Encounter: Payer: Self-pay | Admitting: Internal Medicine

## 2022-10-22 ENCOUNTER — Ambulatory Visit (INDEPENDENT_AMBULATORY_CARE_PROVIDER_SITE_OTHER): Payer: 59 | Admitting: Internal Medicine

## 2022-10-22 VITALS — BP 118/80 | HR 85 | Temp 98.6°F | Resp 16

## 2022-10-22 DIAGNOSIS — J3089 Other allergic rhinitis: Secondary | ICD-10-CM | POA: Diagnosis not present

## 2022-10-22 DIAGNOSIS — J4550 Severe persistent asthma, uncomplicated: Secondary | ICD-10-CM | POA: Diagnosis not present

## 2022-10-22 DIAGNOSIS — L439 Lichen planus, unspecified: Secondary | ICD-10-CM | POA: Diagnosis not present

## 2022-10-22 DIAGNOSIS — K219 Gastro-esophageal reflux disease without esophagitis: Secondary | ICD-10-CM

## 2022-10-22 MED ORDER — CLOBETASOL PROPIONATE 0.05 % EX OINT: 1.0000 | TOPICAL_OINTMENT | Freq: Two times a day (BID) | CUTANEOUS | 0 refills | Status: DC

## 2022-10-22 NOTE — Patient Instructions (Addendum)
Severe Asthma: persistent  - Breathing test today showed: looked so much better! - Biologic Labs 05/2022: AEC 500, IgE 198 without perennial sensitizations  - Dupixent started 06/2022   PLAN:  - Spacer not needed with current regimen. - Daily controller medication(s):  Trelegy 243mg and Singulair '10mg'$  daily - Prior to physical activity: albuterol 2 puffs 10-15 minutes before physical activity. - Rescue medications: albuterol 4 puffs every 4-6 hours as needed  - Asthma control goals:  * Full participation in all desired activities (may need albuterol before activity) * Albuterol use two time or less a week on average (not counting use with activity) * Cough interfering with sleep two time or less a month * Oral steroids no more than once a year * No hospitalizations    GERD  - Continue omeprazole to '40mg'$  twice daily  - Continue dietary and lifestyle modifications   Allergic Rhinitis:  - Previous testing:  06/04/2022: Specific IgE positive to [name] grass and mountain cedar - Hold levocetirizine and singulair due to side effects  - Continue with: Flonase (fluticasone) two sprays per nostril daily and Astelin (azelastine) 2 sprays per nostril 1-2 times daily as needed - Start taking: Allegra '180mg'$  daily (this won't make you sleepy)  - You can use an extra dose of the antihistamine, if needed, for breakthrough symptoms.  - Consider nasal saline rinses 1-2 times daily to remove allergens from the nasal cavities as well as help with mucous clearance (this is especially helpful to do before the nasal sprays are given) - Consider allergy shots as a means of long-term control and can reduce lifetime use of medications   Lichen planus  -We will refer you to dermatology for possible skin biopsy -Continue Clobetasol 0.5% ointment twice a day as needed to control symptoms  Follow up: 3 months   Thank you so much for letting me partake in your care today.  Don't hesitate to reach out if you  have any additional concerns!  ERoney Marion MD  [facility] [place]

## 2022-10-22 NOTE — Progress Notes (Signed)
Follow Up Note  RE: Jeanne Evans MRN: [id number] DOB: Apr 07, 1986 Date of Office Visit: 10/22/2022  Referring provider: [name], MD Primary care provider: [name], MD (Inactive)  Chief Complaint: Asthma  History of Present Illness: I had the pleasure of seeing Jeanne Evans for a follow up visit at the [facility] on 10/22/2022. She is a 36 y.o. female, who is being followed for severe persistent asthma,, GERD, allergic rhinitis, lichen planus. Her previous allergy office visit was on 07/02/2022 with Dr. Edison Pace. Today is a regular follow up visit.  History obtained from patient, chart review.  She reports sedation with levocetirizine and montelukast and has stopped these medications. She has not noticed any increase in rhinitis symptoms since stopping these medications. She has continued Flonase 2 sprays in each nostril daily and will occasionally use Astelin as well. She has not needed any nasal ipratropium.  She feels like her asthma is much improved since starting Dupixent.  Denies any side effects with Dupixent.  Denies any arthralgia like symptoms. She reports compliance with her Trelegy 200 mcg 1 puff daily. Using her albuterol before exercise otherwise has not needed her rescue inhaler more than 2 times per week. Denies any antibiotics or oral steroids since last visit.  Her lichen planus did improve initially with clobetasol, however now she has increased lesions going down her right shin.  She has not seen dermatology.  Lesions are associated with swelling and tenderness of the leg.  Pertinent History/Diagnostics:  - Asthma: severe persistent  -- Biologic Labs 05/2022: AEC 500, IgE 198 without perennial sensitizations  - Dupixent started 06/2022  -0 ED visits, 0 UC visits and 2 oral steroids in the past year - (3/23) 1 number of lifetime hospitalizations, 0 number of lifetime intubations.  -Obstructive/restrictive defect spirometry  (06/04/2022): ratio 65, 1.21 L, 49% FEV1 (pre), + 21% FEV1 (post)  CTPA 3/23: IMPRESSION: 1. No pulmonary embolus. 2. 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 The right perihilar perifissural nodule is smaller than on prior exam. No new or progressive nodularity. 3. Unchanged hilar adenopathy, right greater than left. 4. Bronchial wall thickening most prominent in the lower lobes, can be seen with bronchitis or reactive airways disease - Allergic Rhinitis:   - 06/04/2022: Specific IgE positive to [name] grass and mountain cedar    Assessment and Plan: Jeanne Evans is a 36 y.o. female with: Severe persistent asthma without complication - Plan: Spirometry with Graph  Other allergic rhinitis  Gastroesophageal reflux disease without esophagitis  Lichen planus - Plan: Ambulatory referral to Dermatology Plan: Patient Instructions  Severe Asthma: persistent  - Breathing test today showed: looked so much better! - Biologic Labs 05/2022: AEC 500, IgE 198 without perennial sensitizations  - Dupixent started 06/2022   PLAN:  - Spacer not needed with current regimen. - Daily controller medication(s):  Trelegy 218mg and Singulair '10mg'$  daily - Prior to physical activity: albuterol 2 puffs 10-15 minutes before physical activity. - Rescue medications: albuterol 4 puffs every 4-6 hours as needed  - Asthma control goals:  * Full participation in all desired activities (may need albuterol before activity) * Albuterol use two time or less a week on average (not counting use with activity) * Cough interfering with sleep two time or less a month * Oral steroids no more than once a year * No hospitalizations    GERD  - Continue omeprazole to '40mg'$  twice daily  - Continue  dietary and lifestyle modifications   Allergic Rhinitis:  - Previous testing:  06/04/2022: Specific IgE positive to [name] grass and mountain cedar - Hold levocetirizine  and singulair due to side effects  - Continue with: Flonase (fluticasone) two sprays per nostril daily and Astelin (azelastine) 2 sprays per nostril 1-2 times daily as needed - Start taking: Allegra '180mg'$  daily (this won't make you sleepy)  - You can use an extra dose of the antihistamine, if needed, for breakthrough symptoms.  - Consider nasal saline rinses 1-2 times daily to remove allergens from the nasal cavities as well as help with mucous clearance (this is especially helpful to do before the nasal sprays are given) - Consider allergy shots as a means of long-term control and can reduce lifetime use of medications   Lichen planus  -We will refer you to dermatology for possible skin biopsy -Continue Clobetasol 0.5% ointment twice a day as needed to control symptoms  Follow up: 3 months   Thank you so much for letting me partake in your care today.  Don't hesitate to reach out if you have any additional concerns!  Roney Marion, MD  [facility] [place] [place] No follow-ups on file.  Meds ordered this encounter  Medications   DISCONTD: clobetasol ointment (TEMOVATE) 0.05 %    Sig: Apply 1 Application topically 2 (two) times daily.    Dispense:  30 g    Refill:  0   clobetasol ointment (TEMOVATE) 0.05 %    Sig: Apply 1 Application topically 2 (two) times daily.    Dispense:  30 g    Refill:  0    Lab Orders  No laboratory test(s) ordered today   Diagnostics: Spirometry:  Tracings reviewed. Her effort: Good reproducible efforts. FVC: 2.29 L FEV1: 1.89 L, 77% predicted FEV1/FVC ratio: 83% Interpretation: Spirometry consistent with possible restrictive disease. (Much improved from prior)  Please see scanned spirometry results for details.  Results interpreted by myself during this encounter and discussed with patient/family.   Medication List:  Current Outpatient Medications  Medication Sig Dispense Refill   albuterol (VENTOLIN HFA) 108 (90 Base)  MCG/ACT inhaler Inhale 2 puffs into the lungs every 6 (six) hours as needed for wheezing or shortness of breath. 8 g 6   azelastine (ASTELIN) 0.1 % nasal spray Place 2 sprays into both nostrils 2 (two) times daily. Use in each nostril as directed 30 mL 6   fluticasone (FLONASE) 50 MCG/ACT nasal spray Place 2 sprays into both nostrils daily. 16 g 2   Fluticasone-Umeclidin-Vilant (TRELEGY ELLIPTA) 200-62.5-25 MCG/ACT AEPB Inhale 1 puff into the lungs daily. 1 each 6   gabapentin (NEURONTIN) 300 MG capsule Take 1 capsule (300 mg total) by mouth at bedtime. (Patient taking differently: Take 300 mg by mouth daily as needed (for nerve pain).) 90 capsule 4   ipratropium (ATROVENT) 0.06 % nasal spray 1 spray each nostril up to 3 times daily as needed for runny nose. 15 mL 5   levonorgestrel (MIRENA, 52 MG,) 20 MCG/DAY IUD by Intrauterine route.     lidocaine-prilocaine (EMLA) cream Apply 1 application. topically as needed (for port access).     Olopatadine-Mometasone (RYALTRIS) [id number] MCG/ACT SUSP Place 1 spray into the nose daily. 29 g 5   omeprazole (PRILOSEC) 40 MG capsule Take 1 capsule (40 mg total) by mouth daily. First thing in the morning on an empty stomach 30 capsule 3   ondansetron (ZOFRAN-ODT) 4 MG disintegrating tablet Take 1 tablet (4  mg total) by mouth every 8 (eight) hours as needed for nausea or vomiting. 20 tablet 0   clobetasol ointment (TEMOVATE) 8.58 % Apply 1 Application topically 2 (two) times daily. 30 g 0   fluticasone furoate-vilanterol (BREO ELLIPTA) 100-25 MCG/ACT AEPB Inhale 1 puff into the lungs daily. (Patient not taking: Reported on 07/02/2022) 60 each 5   Current Facility-Administered Medications  Medication Dose Route Frequency Provider Last Rate Last Admin   dupilumab (DUPIXENT) prefilled syringe 300 mg  300 mg Subcutaneous Q14 Days Kennith Gain, MD   300 mg at 10/14/22 1051   Allergies: Allergies  Allergen Reactions   Sulfa Antibiotics Hives   I reviewed  her past medical history, social history, family history, and environmental history and no significant changes have been reported from her previous visit.  ROS: All others negative except as noted per HPI.   Objective: BP 118/80   Pulse 85   Temp 98.6 F (37 C) (Temporal)   Resp 16   SpO2 98%  There is no height or weight on file to calculate BMI. General Appearance:  Alert, cooperative, no distress, appears stated age  Head:  Normocephalic, without obvious abnormality, atraumatic  Eyes:  Conjunctiva clear, EOM's intact  Nose: Nares normal, normal mucosa, no visible anterior polyps, and septum midline  Throat: Lips, tongue normal; teeth and gums normal, normal posterior oropharynx and no tonsillar exudate  Neck: Supple, symmetrical  Lungs:   clear to auscultation bilaterally, Respirations unlabored, no coughing  Heart:  regular rate and rhythm and no murmur, Appears well perfused  Extremities: No edema  Skin: Skin color, texture, turgor normal,"purple flat patches on knee in shin of right leg.  Associated with some silver scaling.  Polygonal in shape.  Neurologic: No gross deficits   Previous notes and tests were reviewed. The plan was reviewed with the patient/family, and all questions/concerned were addressed.  It was my pleasure to see Jeanne Evans today and participate in her care. Please feel free to contact me with any questions or concerns.  Sincerely,  Roney Marion, MD  Allergy & Immunology  [facility] Office: [phone]

## 2022-10-24 ENCOUNTER — Other Ambulatory Visit: Payer: Self-pay

## 2022-11-02 ENCOUNTER — Telehealth: Payer: Self-pay

## 2022-11-02 ENCOUNTER — Ambulatory Visit (INDEPENDENT_AMBULATORY_CARE_PROVIDER_SITE_OTHER): Payer: 59

## 2022-11-02 DIAGNOSIS — J4550 Severe persistent asthma, uncomplicated: Secondary | ICD-10-CM | POA: Diagnosis not present

## 2022-11-02 NOTE — Telephone Encounter (Signed)
-----   Message from Roney Marion, MD sent at 10/22/2022  1:38 PM EST ----- Please help facilitate dermatology referral for poorly controlled lichen planus not responding to high-dose topical steroids

## 2022-11-02 NOTE — Telephone Encounter (Signed)
Thanks

## 2022-11-02 NOTE — Telephone Encounter (Signed)
I was able to get the patient in on tomorrow 11/03/2022 @ 3:00 Dr Lois Huxley who specializes in Lichen Planus. Referral has been faxed to their office.   Dermatology - [facility] Address:  [place], [place] [address] [phone])  Patient wasn't too happy about going to [facility], but stated she would go this time. I told her if she has follow ups, they could be made in their [employer] location. Their next appointment isn't till August 2024 for any of their locations. I informed her with her insurance I could possibly try one other office that is located in [facility] that will take Medicaid as Secondary.

## 2022-11-11 ENCOUNTER — Ambulatory Visit (HOSPITAL_COMMUNITY)
Admission: RE | Admit: 2022-11-11 | Discharge: 2022-11-11 | Disposition: A | Discharge status: Home / Self Care | Payer: 59 | Source: Ambulatory Visit | Attending: Hematology and Oncology | Admitting: Hematology and Oncology

## 2022-11-11 DIAGNOSIS — Z171 Estrogen receptor negative status [ER-]: Secondary | ICD-10-CM | POA: Diagnosis present

## 2022-11-11 DIAGNOSIS — C50412 Malignant neoplasm of upper-outer quadrant of left female breast: Secondary | ICD-10-CM | POA: Insufficient documentation

## 2022-11-11 MED ORDER — GADOBUTROL 1 MMOL/ML IV SOLN
9.0000 mL | Freq: Once | INTRAVENOUS | Status: AC | PRN
  Administered 2022-11-11: 9 mL via INTRAVENOUS

## 2022-11-12 ENCOUNTER — Telehealth: Payer: Self-pay

## 2022-11-12 NOTE — Telephone Encounter (Signed)
-----   Message from Gardenia Phlegm, NP sent at 11/12/2022  4:17 PM EST ----- MRI negative, please call patient with the good news  ----- Message ----- From: Interface, Rad Results In Sent: 11/12/2022  10:15 AM EST To: Benay Pike, MD

## 2022-11-12 NOTE — Telephone Encounter (Signed)
Called Pt and given below message. Pt verbalized understanding and was appreciative of call.

## 2022-11-17 ENCOUNTER — Ambulatory Visit (INDEPENDENT_AMBULATORY_CARE_PROVIDER_SITE_OTHER): Payer: 59

## 2022-11-17 DIAGNOSIS — J4550 Severe persistent asthma, uncomplicated: Secondary | ICD-10-CM

## 2022-11-19 ENCOUNTER — Other Ambulatory Visit: Payer: Self-pay

## 2022-11-19 ENCOUNTER — Inpatient Hospital Stay: Payer: 59 | Attending: Hematology and Oncology | Admitting: Hematology and Oncology

## 2022-11-19 ENCOUNTER — Encounter: Payer: Self-pay | Admitting: Hematology and Oncology

## 2022-11-19 VITALS — BP 147/86 | HR 66 | Temp 97.9°F | Resp 16 | Ht 62.0 in | Wt 203.6 lb

## 2022-11-19 DIAGNOSIS — Z171 Estrogen receptor negative status [ER-]: Secondary | ICD-10-CM | POA: Diagnosis not present

## 2022-11-19 DIAGNOSIS — Z8041 Family history of malignant neoplasm of ovary: Secondary | ICD-10-CM | POA: Insufficient documentation

## 2022-11-19 DIAGNOSIS — C50412 Malignant neoplasm of upper-outer quadrant of left female breast: Secondary | ICD-10-CM | POA: Diagnosis present

## 2022-11-19 DIAGNOSIS — G629 Polyneuropathy, unspecified: Secondary | ICD-10-CM | POA: Diagnosis not present

## 2022-11-19 DIAGNOSIS — Z8042 Family history of malignant neoplasm of prostate: Secondary | ICD-10-CM | POA: Diagnosis not present

## 2022-11-19 DIAGNOSIS — Z801 Family history of malignant neoplasm of trachea, bronchus and lung: Secondary | ICD-10-CM | POA: Insufficient documentation

## 2022-11-19 NOTE — Progress Notes (Signed)
Jeanne Evans, [address] [place] [address]   DIAGNOSIS:  Cancer Staging  Malignant neoplasm of upper-outer quadrant of left breast in female, estrogen receptor negative ([place]) Staging form: Breast, AJCC 8th Edition - Clinical stage from 06/04/2020: Stage IIB (cT2, cN0(f), cM0, G3, ER-, PR-, HER2-) - Unsigned Stage prefix: Initial diagnosis Method of lymph node assessment: Core biopsy Histologic grading system: 3 grade system   SUMMARY OF ONCOLOGIC HISTORY: 37 y.o.  woman status post left breast upper outer quadrant biopsy 05/29/2020 for a clinical T2 N0, stage IIB invasive ductal carcinoma, functionally triple negative, with an MIB-1 of 95%.   (1) genetics testing 06/23/2020 through the [facility] Multi-Cancer Panel found no deleterious mutations in AIP, ALK, APC, ATM, AXIN2,BAP1,  BARD1, BLM, BMPR1A, BRCA1, BRCA2, BRIP1, CASR, CDC73, CDH1, CDK4, CDKN1B, CDKN1C, CDKN2A (p14ARF), CDKN2A (p16INK4a), CEBPA, CHEK2, CTNNA1, DICER1, DIS3L2, EGFR (c.2369C>T, p.Thr790Met variant only), EPCAM (Deletion/duplication testing only), FH, FLCN, GATA2, GPC3, GREM1 (Promoter region deletion/duplication testing only), HOXB13 (c.251G>A, p.Gly84Glu), HRAS, KIT, MAX, MEN1, MET, MITF (c.952G>A, p.Glu318Lys variant only), MLH1, MSH2, MSH3, MSH6, MUTYH, NBN, NF1, NF2, NTHL1, PALB2, PDGFRA, PHOX2B, PMS2, POLD1, POLE, POT1, PRKAR1A, PTCH1, PTEN, RAD50, RAD51C, RAD51D, RB1, RECQL4, RET, RNF43, RUNX1, SDHAF2, SDHA (sequence changes only), SDHB, SDHC, SDHD, SMAD4, SMARCA4, SMARCB1, SMARCE1, STK11, SUFU, TERC, TERT, TMEM127, TP53, TSC1, TSC2, VHL, WRN and WT1.    (2) neoadjuvant chemotherapy consisting of cyclophosphamide and doxorubicin in dose dense fashion x4 started 06/19/2020, completed 08/05/2020, followed by paclitaxel and carboplatin weekly x12 starting 08/26/2020, completing 4 doses 09/23/2020             (a) echocardiogram on 06/13/2020 showed an EF of 60-65%             (b)  paclitaxel and carboplatin discontinued after 4 doses because of neuropathy and cytopenias   (3) status post left lumpectomy and sentinel lymph node sampling 11/19/2020 for a residual yp T1c ypN0 invasive ductal carcinoma involving the superior margin             (a) additional surgery 12/09/2020 cleared the margin in question             (b) repeat prognostic panel confirms triple negative disease             (c) status post left mastopexy and right breast reduction on 10/27/2021 with fat grafting bilaterally   (4) adjuvant radiation : 01/22/2021 through 03/10/2021 Site Technique Total Dose (Gy) Dose per Fx (Gy) Completed Fx Beam Energies  Breast, Left: Breast_Lt 3D 50.4/50.4 1.8 28/28 6X  Breast, Left: Breast_Lt_Bst 3D 10/10 2 5/5 6X              (a) received capecitabine sensitization, started 01/26/2021   (5) pembrolizumab/Keytruda began on 04/16/2021, held 05/11/2021 (after 2 doses) with persistent cough, later attributed to asthma (A) pembrolizumab resumed 08/06/2021 (B) PD-L1 combined score is 10   (6) molecular pathology from the 11/19/2020 sample 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  There were no mutations in BRCA1 or 2, ERBB2 or PIK3CA.  The microsatellite status was equivocal and the tumor mutational burden could not be determined on the sample.   (7) sarcoidosis? (followed by pulmonary: Ramaswamy)             (A) chest CT 06/13/2020 shows in addition to the left breast mass multiple ill-defined pulmonary nodules, pleural nodularity and symmetrical by hilar and mediastinal adenopathy consistent with sarcoidosis. (B) chest CT 05/29/2021 shows stable mediastinal and hilar adenopathy  as well as improved liver lesions.  No evidence of metastatic disease. (C) CT angiogram of the chest on February 02, 2022 shows no evidence of pulmonary embolus and improvement in possible sarcoidosis.  No progressive or new nodularity.  Bronchial wall thickening in the lower lobes which can be seen with bronchitis or  reactive airway disease.   CURRENT THERAPY: observation  INTERVAL HISTORY:  Jeanne Evans 37 y.o. female returns for evaluation after completing adjuvant immunotherapy.  Jeanne Evans arrived for follow-up today.  Since her last visit here, her breathing is much better.  She went back to work full-time and has been busy.  She denies any changes in her breast area.  No new breathing issues.  No change in bowel habits, urinary habits.  No new neurological complaints. Rest of the pertinent 10 point ROS reviewed and negative  Patient Active Problem List   Diagnosis Date Noted   Port-A-Cath in place 03/11/2022   Acute respiratory failure with hypoxemia ([place]) 02/03/2022   Acute respiratory failure with hypoxia ([place]) 02/03/2022   Asthma 07/31/2021   Lymphedema of left arm 02/02/2021   Drug-induced neutropenia ([place]) 09/30/2020   Genetic testing 06/26/2020   Family history of ovarian cancer    Family history of prostate cancer    Malignant neoplasm of upper-outer quadrant of left breast in female, estrogen receptor negative ([place]) 06/03/2020    is allergic to sulfa antibiotics.  MEDICAL HISTORY: Past Medical History:  Diagnosis Date   Asthma 07/31/2021   Breast cancer ([place])    Family history of ovarian cancer    Family history of prostate cancer    GERD (gastroesophageal reflux disease)    History of asthma    has albuterol, uses PRN   History of heart murmur in childhood    History of multiple allergies     SURGICAL HISTORY: Past Surgical History:  Procedure Laterality Date   BREAST LUMPECTOMY WITH RADIOACTIVE SEED AND SENTINEL LYMPH NODE BIOPSY Left 11/19/2020   Procedure: LEFT BREAST LUMPECTOMY X 2 WITH RADIOACTIVE SEED AND SENTINEL LYMPH NODE MAPPING;  Surgeon: Erroll Luna, MD;  Location: [facility];  Service: General;  Laterality: Left;  PECTORAL BLOCK   BREAST REDUCTION SURGERY Right 10/27/2021   Procedure: MAMMARY REDUCTION  TO RIGHT BREAST;  Surgeon:  [name], MD;  Location: [facility];  Service: Plastics;  Laterality: Right;   LIPOSUCTION WITH LIPOFILLING Bilateral 10/27/2021   Procedure: BILATERAL FAT GRAFTING TO BREAST;  Surgeon: [name], MD;  Location: [facility];  Service: Plastics;  Laterality: Bilateral;   PORTACATH PLACEMENT Right 06/18/2020   Procedure: INSERTION PORT-A-CATH WITH ULTRASOUND GUIDANCE;  Surgeon: Erroll Luna, MD;  Location: [facility];  Service: General;  Laterality: Right;   RE-EXCISION OF BREAST LUMPECTOMY Left 12/09/2020   Procedure: RE-EXCISION OF LEFT BREAST LUMPECTOMY;  Surgeon: Erroll Luna, MD;  Location: [facility];  Service: General;  Laterality: Left;    SOCIAL HISTORY: Social History   Socioeconomic History   Marital status: Single    Spouse name: Not on file   Number of children: Not on file   Years of education: Not on file   Highest education level: Not on file  Occupational History   Not on file  Tobacco Use   Smoking status: Never    Passive exposure: Never   Smokeless tobacco: Never  Vaping Use   Vaping Use: Never used  Substance and Sexual Activity   Alcohol use:  Yes    Comment: occas   Drug use: Yes    Types: Marijuana    Comment: daily   Sexual activity: Not on file  Other Topics Concern   Not on file  Social History Narrative   Not on file   Social Determinants of Health   Financial Resource Strain: High Risk (09/18/2021)   Overall Financial Resource Strain (CARDIA)    Difficulty of Paying Living Expenses: Very hard  Food Insecurity: Food Insecurity Present (09/18/2021)   Hunger Vital Sign    Worried About Running Out of Food in the Last Year: Sometimes true    Ran Out of Food in the Last Year: Sometimes true  Transportation Needs: No Transportation Needs (09/18/2021)   PRAPARE - Hydrologist (Medical): No    Lack of Transportation (Non-Medical): No  Physical  Activity: Not on file  Stress: Stress Concern Present (09/18/2021)   [place]    Feeling of Stress : Very much  Social Connections: Socially Isolated (09/18/2021)   Social Connection and Isolation Panel [NHANES]    Frequency of Communication with Friends and Family: More than three times a week    Frequency of Social Gatherings with Friends and Family: Once a week    Attends Religious Services: Never    Marine scientist or Organizations: No    Attends Music therapist: Never    Marital Status: Never married  Human resources officer Violence: Not on file    FAMILY HISTORY: Family History  Problem Relation Age of Onset   Asthma Mother    Asthma Brother    Lung cancer Maternal Uncle        dx late 63s   Prostate cancer Paternal Uncle        dx late 73s   Prostate cancer Paternal Uncle        dx early 15s   Ovarian cancer Paternal Grandmother        dx 19s   Prostate cancer Paternal Grandfather        dx 34s    Review of Systems  Constitutional:  Positive for fatigue. Negative for appetite change, chills, fever and unexpected weight change.  HENT:   Negative for hearing loss, lump/mass, mouth sores, sore throat and trouble swallowing.   Eyes:  Negative for eye problems and icterus.  Respiratory:  Negative for chest tightness, cough and shortness of breath.   Cardiovascular:  Negative for chest pain, leg swelling and palpitations.  Gastrointestinal:  Negative for abdominal distention, abdominal pain, constipation, diarrhea, nausea and vomiting.  Endocrine: Negative for hot flashes.  Genitourinary:  Negative for difficulty urinating.   Musculoskeletal:  Negative for arthralgias.  Skin:  Negative for itching and rash.  Neurological:  Negative for dizziness, extremity weakness, headaches and numbness.  Hematological:  Negative for adenopathy. Does not bruise/bleed easily.  Psychiatric/Behavioral:   Negative for depression. The patient is not nervous/anxious.       PHYSICAL EXAMINATION  ECOG PERFORMANCE STATUS: 1 - Symptomatic but completely ambulatory  Vitals:   11/19/22 1151  BP: (!) 147/86  Pulse: 66  Resp: 16  Temp: 97.9 F (36.6 C)  SpO2: 100%    Physical Exam Constitutional:      General: She is not in acute distress.    Appearance: Normal appearance. She is not toxic-appearing.  HENT:     Head: Normocephalic and atraumatic.     Mouth/Throat:  Mouth: Mucous membranes are moist.  Eyes:     General: No scleral icterus. Cardiovascular:     Rate and Rhythm: Normal rate and regular rhythm.     Pulses: Normal pulses.     Heart sounds: Normal heart sounds.  Pulmonary:     Effort: Pulmonary effort is normal.     Breath sounds: Normal breath sounds.  Abdominal:     General: Abdomen is flat. Bowel sounds are normal. There is no distension.     Palpations: Abdomen is soft.     Tenderness: There is no abdominal tenderness.  Musculoskeletal:        General: No swelling.     Cervical back: Neck supple.  Lymphadenopathy:     Cervical: No cervical adenopathy.  Skin:    General: Skin is warm and dry.     Findings: No rash.  Neurological:     Mental Status: She is alert.  Psychiatric:        Mood and Affect: Mood normal.        Behavior: Behavior normal.     LABORATORY DATA:  CBC    Component Value Date/Time   WBC 4.8 06/04/2022 0928   WBC 4.8 05/20/2022 1252   RBC 4.55 06/04/2022 0928   RBC 4.28 05/20/2022 1252   HGB 12.6 06/04/2022 0928   HCT 40.1 06/04/2022 0928   PLT 290 06/04/2022 0928   MCV 88 06/04/2022 0928   MCH 27.7 06/04/2022 0928   MCH 28.7 05/20/2022 1252   MCHC 31.4 (L) 06/04/2022 0928   MCHC 33.7 05/20/2022 1252   RDW 14.5 06/04/2022 0928   LYMPHSABS 2.3 06/04/2022 0928   MONOABS 0.4 05/20/2022 1252   EOSABS 0.5 (H) 06/04/2022 0928   BASOSABS 0.0 06/04/2022 0928    CMP     Component Value Date/Time   NA 137 05/20/2022 1252    K 4.0 05/20/2022 1252   CL 104 05/20/2022 1252   CO2 28 05/20/2022 1252   GLUCOSE 92 05/20/2022 1252   BUN 18 05/20/2022 1252   CREATININE 0.99 05/20/2022 1252   CREATININE 1.03 (H) 10/14/2021 1356   CALCIUM 9.6 05/20/2022 1252   PROT 7.7 05/20/2022 1252   ALBUMIN 4.5 05/20/2022 1252   AST 18 05/20/2022 1252   AST 18 10/14/2021 1356   ALT 21 05/20/2022 1252   ALT 21 10/14/2021 1356   ALKPHOS 82 05/20/2022 1252   BILITOT 0.5 05/20/2022 1252   BILITOT 0.8 10/14/2021 1356   GFRNONAA >60 05/20/2022 1252   GFRNONAA >60 10/14/2021 1356   GFRAA >60 08/05/2020 0955   GFRAA >60 06/04/2020 0830    ASSESSMENT and THERAPY PLAN:   Malignant neoplasm of upper-outer quadrant of left breast in female, estrogen receptor negative ([place]) Jeanne Evans is a 37 year old woman with 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 She is now here after completing immunotherapy. She is doing quite well overall. No change in health, no new breast changes. MR breast Dec 28, negative for malignancy. She will continue annual MRIs based on recommendations from radiology.  Her mammogram is due in June 2024. No definitive concerns for breast cancer recurrence on exam today.  Review of systems also quite unremarkable.  She was of course encouraged to contact us sooner with any new questions or concerns.  She will otherwise return to clinic in 6 months. I have sent a message to Dr. Brantley Stage about the port removal.  With regards to  the foot pain, she was instructed to keep [place] posted if this does not improve in the next few weeks.  She may benefit from referral to podiatry.  Total encounter time:30 minutes*in face-to-face visit time, chart review, lab review, care coordination, order entry, and documentation of the encounter time.  Benay Pike MD   *Total Encounter Time as defined by  the Centers for Medicare and Medicaid Services includes, in addition to the face-to-face time of a patient visit (documented in the note above) non-face-to-face time: obtaining and reviewing outside history, ordering and reviewing medications, tests or procedures, care coordination (communications with other health care professionals or caregivers) and documentation in the medical record.

## 2022-11-19 NOTE — Assessment & Plan Note (Addendum)
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 She is now here after completing immunotherapy. She is doing quite well overall. No change in health, no new breast changes. MR breast Dec 28, negative for malignancy. She will continue annual MRIs based on recommendations from radiology.  Her mammogram is due in June 2024. No definitive concerns for breast cancer recurrence on exam today.  Review of systems also quite unremarkable.  She was of course encouraged to contact us sooner with any new questions or concerns.  She will otherwise return to clinic in 6 months.

## 2022-11-21 ENCOUNTER — Other Ambulatory Visit: Payer: Self-pay

## 2022-12-01 ENCOUNTER — Ambulatory Visit (INDEPENDENT_AMBULATORY_CARE_PROVIDER_SITE_OTHER): Payer: 59

## 2022-12-01 DIAGNOSIS — J4550 Severe persistent asthma, uncomplicated: Secondary | ICD-10-CM | POA: Diagnosis not present

## 2022-12-14 ENCOUNTER — Telehealth: Payer: Self-pay

## 2022-12-14 ENCOUNTER — Ambulatory Visit (HOSPITAL_COMMUNITY): Admission: RE | Admit: 2022-12-14 | Payer: 59 | Source: Ambulatory Visit

## 2022-12-14 NOTE — Telephone Encounter (Signed)
S/w pt to find out if she is still experiencing head aches. Pt reports she feels painful head aches when she wakes up, she lies back down then headache alleviates. She will go have MRI today.

## 2022-12-15 ENCOUNTER — Ambulatory Visit (INDEPENDENT_AMBULATORY_CARE_PROVIDER_SITE_OTHER): Payer: 59

## 2022-12-15 ENCOUNTER — Ambulatory Visit: Payer: 59

## 2022-12-15 DIAGNOSIS — J4550 Severe persistent asthma, uncomplicated: Secondary | ICD-10-CM

## 2022-12-16 ENCOUNTER — Ambulatory Visit: Payer: 59

## 2022-12-20 ENCOUNTER — Ambulatory Visit (HOSPITAL_COMMUNITY)
Admission: RE | Admit: 2022-12-20 | Discharge: 2022-12-20 | Disposition: A | Discharge status: Home / Self Care | Payer: 59 | Source: Ambulatory Visit | Attending: Hematology and Oncology | Admitting: Hematology and Oncology

## 2022-12-20 DIAGNOSIS — C50412 Malignant neoplasm of upper-outer quadrant of left female breast: Secondary | ICD-10-CM | POA: Insufficient documentation

## 2022-12-20 DIAGNOSIS — Z171 Estrogen receptor negative status [ER-]: Secondary | ICD-10-CM | POA: Insufficient documentation

## 2022-12-20 MED ORDER — GADOBUTROL 1 MMOL/ML IV SOLN
9.0000 mL | Freq: Once | INTRAVENOUS | Status: AC | PRN
  Administered 2022-12-20: 9 mL via INTRAVENOUS

## 2022-12-24 ENCOUNTER — Other Ambulatory Visit: Payer: Self-pay | Admitting: *Deleted

## 2022-12-24 ENCOUNTER — Telehealth: Payer: Self-pay | Admitting: *Deleted

## 2022-12-24 DIAGNOSIS — Z171 Estrogen receptor negative status [ER-]: Secondary | ICD-10-CM

## 2022-12-24 NOTE — Telephone Encounter (Signed)
Notified that MRI was clear. Referral sent to Neurology for  headache evaluation  Pt states she has a "hard knot" on her left arm about 2 inches above elbow. Has been there 2 weeks. Does not think she had an injury to that arm. Please advise

## 2022-12-27 ENCOUNTER — Telehealth: Payer: Self-pay | Admitting: Hematology and Oncology

## 2022-12-27 NOTE — Telephone Encounter (Signed)
Spoke with patient confirming 2/13 appointment

## 2022-12-28 ENCOUNTER — Encounter: Payer: Self-pay | Admitting: Hematology and Oncology

## 2022-12-28 ENCOUNTER — Other Ambulatory Visit: Payer: Self-pay

## 2022-12-28 ENCOUNTER — Inpatient Hospital Stay: Payer: 59 | Attending: Hematology and Oncology | Admitting: Hematology and Oncology

## 2022-12-28 VITALS — BP 132/85 | HR 74 | Temp 98.1°F | Resp 16 | Ht 62.0 in | Wt 199.4 lb

## 2022-12-28 DIAGNOSIS — C50412 Malignant neoplasm of upper-outer quadrant of left female breast: Secondary | ICD-10-CM

## 2022-12-28 DIAGNOSIS — Z171 Estrogen receptor negative status [ER-]: Secondary | ICD-10-CM | POA: Diagnosis not present

## 2022-12-28 DIAGNOSIS — Z853 Personal history of malignant neoplasm of breast: Secondary | ICD-10-CM | POA: Diagnosis present

## 2022-12-28 DIAGNOSIS — Z8042 Family history of malignant neoplasm of prostate: Secondary | ICD-10-CM | POA: Insufficient documentation

## 2022-12-28 DIAGNOSIS — Z8041 Family history of malignant neoplasm of ovary: Secondary | ICD-10-CM | POA: Insufficient documentation

## 2022-12-28 DIAGNOSIS — Z801 Family history of malignant neoplasm of trachea, bronchus and lung: Secondary | ICD-10-CM | POA: Insufficient documentation

## 2022-12-28 NOTE — Progress Notes (Signed)
Jeanne Evans, [address] [place] [address]   DIAGNOSIS:  Cancer Staging  Malignant neoplasm of upper-outer quadrant of left breast in female, estrogen receptor negative ([place]) Staging form: Breast, AJCC 8th Edition - Clinical stage from 06/04/2020: Stage IIB (cT2, cN0(f), cM0, G3, ER-, PR-, HER2-) - Unsigned Stage prefix: Initial diagnosis Method of lymph node assessment: Core biopsy Histologic grading system: 3 grade system   SUMMARY OF ONCOLOGIC HISTORY:  37 y.o.  woman status post left breast upper outer quadrant biopsy 05/29/2020 for a clinical T2 N0, stage IIB invasive ductal carcinoma, functionally triple negative, with an MIB-1 of 95%.   (1) genetics testing 06/23/2020 through the [facility] Multi-Cancer Panel found no deleterious mutations in AIP, ALK, APC, ATM, AXIN2,BAP1,  BARD1, BLM, BMPR1A, BRCA1, BRCA2, BRIP1, CASR, CDC73, CDH1, CDK4, CDKN1B, CDKN1C, CDKN2A (p14ARF), CDKN2A (p16INK4a), CEBPA, CHEK2, CTNNA1, DICER1, DIS3L2, EGFR (c.2369C>T, p.Thr790Met variant only), EPCAM (Deletion/duplication testing only), FH, FLCN, GATA2, GPC3, GREM1 (Promoter region deletion/duplication testing only), HOXB13 (c.251G>A, p.Gly84Glu), HRAS, KIT, MAX, MEN1, MET, MITF (c.952G>A, p.Glu318Lys variant only), MLH1, MSH2, MSH3, MSH6, MUTYH, NBN, NF1, NF2, NTHL1, PALB2, PDGFRA, PHOX2B, PMS2, POLD1, POLE, POT1, PRKAR1A, PTCH1, PTEN, RAD50, RAD51C, RAD51D, RB1, RECQL4, RET, RNF43, RUNX1, SDHAF2, SDHA (sequence changes only), SDHB, SDHC, SDHD, SMAD4, SMARCA4, SMARCB1, SMARCE1, STK11, SUFU, TERC, TERT, TMEM127, TP53, TSC1, TSC2, VHL, WRN and WT1.    (2) neoadjuvant chemotherapy consisting of cyclophosphamide and doxorubicin in dose dense fashion x4 started 06/19/2020, completed 08/05/2020, followed by paclitaxel and carboplatin weekly x12 starting 08/26/2020, completing 4 doses 09/23/2020             (a) echocardiogram on 06/13/2020 showed an EF of 60-65%             (b)  paclitaxel and carboplatin discontinued after 4 doses because of neuropathy and cytopenias   (3) status post left lumpectomy and sentinel lymph node sampling 11/19/2020 for a residual yp T1c ypN0 invasive ductal carcinoma involving the superior margin             (a) additional surgery 12/09/2020 cleared the margin in question             (b) repeat prognostic panel confirms triple negative disease             (c) status post left mastopexy and right breast reduction on 10/27/2021 with fat grafting bilaterally   (4) adjuvant radiation : 01/22/2021 through 03/10/2021 Site Technique Total Dose (Gy) Dose per Fx (Gy) Completed Fx Beam Energies  Breast, Left: Breast_Lt 3D 50.4/50.4 1.8 28/28 6X  Breast, Left: Breast_Lt_Bst 3D 10/10 2 5/5 6X              (a) received capecitabine sensitization, started 01/26/2021   (5) pembrolizumab/Keytruda began on 04/16/2021, held 05/11/2021 (after 2 doses) with persistent cough, later attributed to asthma (A) pembrolizumab resumed 08/06/2021 (B) PD-L1 combined score is 10   (6) molecular pathology from the 11/19/2020 sample 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  There were no mutations in BRCA1 or 2, ERBB2 or PIK3CA.  The microsatellite status was equivocal and the tumor mutational burden could not be determined on the sample.   (7) sarcoidosis? (followed by pulmonary: Ramaswamy)             (A) chest CT 06/13/2020 shows in addition to the left breast mass multiple ill-defined pulmonary nodules, pleural nodularity and symmetrical by hilar and mediastinal adenopathy consistent with sarcoidosis. (B) chest CT 05/29/2021 shows stable mediastinal and hilar  adenopathy as well as improved liver lesions.  No evidence of metastatic disease. (C) CT angiogram of the chest on February 02, 2022 shows no evidence of pulmonary embolus and improvement in possible sarcoidosis.  No progressive or new nodularity.  Bronchial wall thickening in the lower lobes which can be seen with bronchitis or  reactive airway disease.   CURRENT THERAPY: observation  INTERVAL HISTORY:  Jeanne Evans 37 y.o. female returns for follow up. She says left elbow had swelling and lump which has 2 weeks, swelling and the lump in the medial left forearm has improved significantly. She is also complaining of some intermittent hot flashes. She also noticed some soreness in the left axilla which once again has improved since about 2 weeks ago. Rest of the pertinent 10 point ROS reviewed and negative  Patient Active Problem List   Diagnosis Date Noted   Port-A-Cath in place 03/11/2022   Acute respiratory failure with hypoxemia ([place]) 02/03/2022   Acute respiratory failure with hypoxia ([place]) 02/03/2022   Asthma 07/31/2021   Lymphedema of left arm 02/02/2021   Drug-induced neutropenia ([place]) 09/30/2020   Genetic testing 06/26/2020   Family history of ovarian cancer    Family history of prostate cancer    Malignant neoplasm of upper-outer quadrant of left breast in female, estrogen receptor negative ([place]) 06/03/2020    is allergic to sulfa antibiotics.  MEDICAL HISTORY: Past Medical History:  Diagnosis Date   Asthma 07/31/2021   Breast cancer ([place])    Family history of ovarian cancer    Family history of prostate cancer    GERD (gastroesophageal reflux disease)    History of asthma    has albuterol, uses PRN   History of heart murmur in childhood    History of multiple allergies     SURGICAL HISTORY: Past Surgical History:  Procedure Laterality Date   BREAST LUMPECTOMY WITH RADIOACTIVE SEED AND SENTINEL LYMPH NODE BIOPSY Left 11/19/2020   Procedure: LEFT BREAST LUMPECTOMY X 2 WITH RADIOACTIVE SEED AND SENTINEL LYMPH NODE MAPPING;  Surgeon: Erroll Luna, MD;  Location: [place];  Service: General;  Laterality: Left;  PECTORAL BLOCK   BREAST REDUCTION SURGERY Right 10/27/2021   Procedure: MAMMARY REDUCTION  TO RIGHT BREAST;  Surgeon: [name], MD;  Location: [place];  Service: Plastics;  Laterality: Right;   LIPOSUCTION WITH LIPOFILLING Bilateral 10/27/2021   Procedure: BILATERAL FAT GRAFTING TO BREAST;  Surgeon: [name], MD;  Location: [place];  Service: Plastics;  Laterality: Bilateral;   PORTACATH PLACEMENT Right 06/18/2020   Procedure: INSERTION PORT-A-CATH WITH ULTRASOUND GUIDANCE;  Surgeon: Erroll Luna, MD;  Location: [place];  Service: General;  Laterality: Right;   RE-EXCISION OF BREAST LUMPECTOMY Left 12/09/2020   Procedure: RE-EXCISION OF LEFT BREAST LUMPECTOMY;  Surgeon: Erroll Luna, MD;  Location: [facility];  Service: General;  Laterality: Left;    SOCIAL HISTORY: Social History   Socioeconomic History   Marital status: Single    Spouse name: Not on file   Number of children: Not on file   Years of education: Not on file   Highest education level: Not on file  Occupational History   Not on file  Tobacco Use   Smoking status: Never    Passive exposure: Never   Smokeless tobacco: Never  Vaping Use   Vaping Use: Never used  Substance and Sexual Activity   Alcohol use: Yes    Comment: occas  Drug use: Yes    Types: Marijuana    Comment: daily   Sexual activity: Not on file  Other Topics Concern   Not on file  Social History Narrative   Not on file   Social Determinants of Health   Financial Resource Strain: High Risk (09/18/2021)   Overall Financial Resource Strain (CARDIA)    Difficulty of Paying Living Expenses: Very hard  Food Insecurity: Food Insecurity Present (09/18/2021)   Hunger Vital Sign    Worried About Running Out of Food in the Last Year: Sometimes true    Ran Out of Food in the Last Year: Sometimes true  Transportation Needs: No Transportation Needs (09/18/2021)   PRAPARE - Hydrologist (Medical): No    Lack of Transportation (Non-Medical): No  Physical Activity: Not on file  Stress: Stress  Concern Present (09/18/2021)   [place]    Feeling of Stress : Very much  Social Connections: Socially Isolated (09/18/2021)   Social Connection and Isolation Panel [NHANES]    Frequency of Communication with Friends and Family: More than three times a week    Frequency of Social Gatherings with Friends and Family: Once a week    Attends Religious Services: Never    Marine scientist or Organizations: No    Attends Music therapist: Never    Marital Status: Never married  Human resources officer Violence: Not on file    FAMILY HISTORY: Family History  Problem Relation Age of Onset   Asthma Mother    Asthma Brother    Lung cancer Maternal Uncle        dx late 32s   Prostate cancer Paternal Uncle        dx late 23s   Prostate cancer Paternal Uncle        dx early 41s   Ovarian cancer Paternal Grandmother        dx 98s   Prostate cancer Paternal Grandfather        dx 76s    Review of Systems  Constitutional:  Positive for fatigue. Negative for appetite change, chills, fever and unexpected weight change.  HENT:   Negative for hearing loss, lump/mass, mouth sores, sore throat and trouble swallowing.   Eyes:  Negative for eye problems and icterus.  Respiratory:  Negative for chest tightness, cough and shortness of breath.   Cardiovascular:  Negative for chest pain, leg swelling and palpitations.  Gastrointestinal:  Negative for abdominal distention, abdominal pain, constipation, diarrhea, nausea and vomiting.  Endocrine: Negative for hot flashes.  Genitourinary:  Negative for difficulty urinating.   Musculoskeletal:  Negative for arthralgias.  Skin:  Negative for itching and rash.  Neurological:  Negative for dizziness, extremity weakness, headaches and numbness.  Hematological:  Negative for adenopathy. Does not bruise/bleed easily.  Psychiatric/Behavioral:  Negative for depression. The patient is not  nervous/anxious.       PHYSICAL EXAMINATION  ECOG PERFORMANCE STATUS: 1 - Symptomatic but completely ambulatory  Vitals:   12/28/22 0946  BP: 132/85  Pulse: 74  Resp: 16  Temp: 98.1 F (36.7 C)  SpO2: 100%    Physical Exam Constitutional:      General: She is not in acute distress.    Appearance: Normal appearance. She is not toxic-appearing.  HENT:     Head: Normocephalic and atraumatic.     Mouth/Throat:     Mouth: Mucous membranes are moist.  Eyes:  General: No scleral icterus. Cardiovascular:     Rate and Rhythm: Normal rate and regular rhythm.     Pulses: Normal pulses.     Heart sounds: Normal heart sounds.  Pulmonary:     Effort: Pulmonary effort is normal.     Breath sounds: Normal breath sounds.  Abdominal:     General: Abdomen is flat. Bowel sounds are normal. There is no distension.     Palpations: Abdomen is soft.     Tenderness: There is no abdominal tenderness.  Musculoskeletal:        General: No swelling.     Cervical back: Neck supple.  Lymphadenopathy:     Cervical: No cervical adenopathy.  Skin:    General: Skin is warm and dry.     Findings: No rash.  Neurological:     Mental Status: She is alert.  Psychiatric:        Mood and Affect: Mood normal.        Behavior: Behavior normal.     LABORATORY DATA:  CBC    Component Value Date/Time   WBC 4.8 06/04/2022 0928   WBC 4.8 05/20/2022 1252   RBC 4.55 06/04/2022 0928   RBC 4.28 05/20/2022 1252   HGB 12.6 06/04/2022 0928   HCT 40.1 06/04/2022 0928   PLT 290 06/04/2022 0928   MCV 88 06/04/2022 0928   MCH 27.7 06/04/2022 0928   MCH 28.7 05/20/2022 1252   MCHC 31.4 (L) 06/04/2022 0928   MCHC 33.7 05/20/2022 1252   RDW 14.5 06/04/2022 0928   LYMPHSABS 2.3 06/04/2022 0928   MONOABS 0.4 05/20/2022 1252   EOSABS 0.5 (H) 06/04/2022 0928   BASOSABS 0.0 06/04/2022 0928    CMP     Component Value Date/Time   NA 137 05/20/2022 1252   K 4.0 05/20/2022 1252   CL 104 05/20/2022  1252   CO2 28 05/20/2022 1252   GLUCOSE 92 05/20/2022 1252   BUN 18 05/20/2022 1252   CREATININE 0.99 05/20/2022 1252   CREATININE 1.03 (H) 10/14/2021 1356   CALCIUM 9.6 05/20/2022 1252   PROT 7.7 05/20/2022 1252   ALBUMIN 4.5 05/20/2022 1252   AST 18 05/20/2022 1252   AST 18 10/14/2021 1356   ALT 21 05/20/2022 1252   ALT 21 10/14/2021 1356   ALKPHOS 82 05/20/2022 1252   BILITOT 0.5 05/20/2022 1252   BILITOT 0.8 10/14/2021 1356   GFRNONAA >60 05/20/2022 1252   GFRNONAA >60 10/14/2021 1356   GFRAA >60 08/05/2020 0955   GFRAA >60 06/04/2020 0830    ASSESSMENT and THERAPY PLAN:   Malignant neoplasm of upper-outer quadrant of left breast in female, estrogen receptor negative ([place]) Jeanne Evans is a 37 year old woman with 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 She is now here after completing immunotherapy. She is doing quite well overall. No change in health, no new breast changes. MR breast Dec 28, negative for malignancy. She will continue annual MRIs based on recommendations from radiology.  Her mammogram is due in June 2024. No definitive concerns for breast cancer recurrence on exam today.   Swelling of the Left forearm which she initially was worried about has also significantly improves, this feels like a resolving hematoma, she denies any known trauma. I will call her back in 2 weeks to follow up on this. I dont believe this is related to the breast cancer. Vasomotor symptoms of menopause, encouraged hydration and regular exercise.  Total encounter time:30 minutes*in face-to-face visit time, chart review, lab review, care coordination, order entry, and documentation of the encounter time.  Benay Pike MD   *Total Encounter Time as defined by the Centers for Medicare and Medicaid Services includes, in addition to the face-to-face time  of a patient visit (documented in the note above) non-face-to-face time: obtaining and reviewing outside history, ordering and reviewing medications, tests or procedures, care coordination (communications with other health care professionals or caregivers) and documentation in the medical record.

## 2022-12-28 NOTE — Assessment & Plan Note (Addendum)
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 She is now here after completing immunotherapy. She is doing quite well overall. No change in health, no new breast changes. MR breast Dec 28, negative for malignancy. She will continue annual MRIs based on recommendations from radiology.  Her mammogram is due in June 2024. No definitive concerns for breast cancer recurrence on exam today.   Swelling of the Left forearm which she initially was worried about has also significantly improves, this feels like a resolving hematoma, she denies any known trauma. I will call her back in 2 weeks to follow up on this. I dont believe this is related to the breast cancer. Vasomotor symptoms of menopause, encouraged hydration and regular exercise.

## 2022-12-29 ENCOUNTER — Ambulatory Visit (INDEPENDENT_AMBULATORY_CARE_PROVIDER_SITE_OTHER): Payer: 59

## 2022-12-29 DIAGNOSIS — J4550 Severe persistent asthma, uncomplicated: Secondary | ICD-10-CM | POA: Diagnosis not present

## 2023-01-01 ENCOUNTER — Ambulatory Visit: Payer: 59

## 2023-01-11 ENCOUNTER — Inpatient Hospital Stay (HOSPITAL_BASED_OUTPATIENT_CLINIC_OR_DEPARTMENT_OTHER): Payer: 59 | Admitting: Hematology and Oncology

## 2023-01-11 DIAGNOSIS — Z171 Estrogen receptor negative status [ER-]: Secondary | ICD-10-CM

## 2023-01-11 DIAGNOSIS — C50412 Malignant neoplasm of upper-outer quadrant of left female breast: Secondary | ICD-10-CM

## 2023-01-11 NOTE — Progress Notes (Signed)
Jeanne Evans, [address] [id number]   DIAGNOSIS:  Cancer Staging  Malignant neoplasm of upper-outer quadrant of left breast in female, estrogen receptor negative ([place]) Staging form: Breast, AJCC 8th Edition - Clinical stage from 06/04/2020: Stage IIB (cT2, cN0(f), cM0, G3, ER-, PR-, HER2-) - Unsigned Stage prefix: Initial diagnosis Method of lymph node assessment: Core biopsy Histologic grading system: 3 grade system   SUMMARY OF ONCOLOGIC HISTORY:  37 y.o. [place] woman status post left breast upper outer quadrant biopsy 05/29/2020 for a clinical T2 N0, stage IIB invasive ductal carcinoma, functionally triple negative, with an MIB-1 of 95%.   (1) genetics testing 06/23/2020 through the [facility] Multi-Cancer Panel found no deleterious mutations in AIP, ALK, APC, ATM, AXIN2,BAP1,  BARD1, BLM, BMPR1A, BRCA1, BRCA2, BRIP1, CASR, CDC73, CDH1, CDK4, CDKN1B, CDKN1C, CDKN2A (p14ARF), CDKN2A (p16INK4a), CEBPA, CHEK2, CTNNA1, DICER1, DIS3L2, EGFR (c.2369C>T, p.Thr790Met variant only), EPCAM (Deletion/duplication testing only), FH, FLCN, GATA2, GPC3, GREM1 (Promoter region deletion/duplication testing only), HOXB13 (c.251G>A, p.Gly84Glu), HRAS, KIT, MAX, MEN1, MET, MITF (c.952G>A, p.Glu318Lys variant only), MLH1, MSH2, MSH3, MSH6, MUTYH, NBN, NF1, NF2, NTHL1, PALB2, PDGFRA, PHOX2B, PMS2, POLD1, POLE, POT1, PRKAR1A, PTCH1, PTEN, RAD50, RAD51C, RAD51D, RB1, RECQL4, RET, RNF43, RUNX1, SDHAF2, SDHA (sequence changes only), SDHB, SDHC, SDHD, SMAD4, SMARCA4, SMARCB1, SMARCE1, STK11, SUFU, TERC, TERT, TMEM127, TP53, TSC1, TSC2, VHL, WRN and WT1.    (2) neoadjuvant chemotherapy consisting of cyclophosphamide and doxorubicin in dose dense fashion x4 started 06/19/2020, completed 08/05/2020, followed by paclitaxel and carboplatin weekly x12 starting 08/26/2020, completing 4 doses 09/23/2020             (a) echocardiogram on 06/13/2020 showed an EF of 60-65%             (b)  paclitaxel and carboplatin discontinued after 4 doses because of neuropathy and cytopenias   (3) status post left lumpectomy and sentinel lymph node sampling 11/19/2020 for a residual yp T1c ypN0 invasive ductal carcinoma involving the superior margin             (a) additional surgery 12/09/2020 cleared the margin in question             (b) repeat prognostic panel confirms triple negative disease             (c) status post left mastopexy and right breast reduction on 10/27/2021 with fat grafting bilaterally   (4) adjuvant radiation : 01/22/2021 through 03/10/2021 Site Technique Total Dose (Gy) Dose per Fx (Gy) Completed Fx Beam Energies  Breast, Left: Breast_Lt 3D 50.4/50.4 1.8 28/28 6X  Breast, Left: Breast_Lt_Bst 3D 10/10 2 5/5 6X              (a) received capecitabine sensitization, started 01/26/2021   (5) pembrolizumab/Keytruda began on 04/16/2021, held 05/11/2021 (after 2 doses) with persistent cough, later attributed to asthma (A) pembrolizumab resumed 08/06/2021 (B) PD-L1 combined score is 10   (6) molecular pathology from the 11/19/2020 sample 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  There were no mutations in BRCA1 or 2, ERBB2 or PIK3CA.  The microsatellite status was equivocal and the tumor mutational burden could not be determined on the sample.   (7) sarcoidosis? (followed by pulmonary: Ramaswamy)             (A) chest CT 06/13/2020 shows in addition to the left breast mass multiple ill-defined pulmonary nodules, pleural nodularity and symmetrical by hilar and mediastinal adenopathy consistent with sarcoidosis. (B) chest CT 05/29/2021 shows stable mediastinal and hilar  adenopathy as well as improved liver lesions.  No evidence of metastatic disease. (C) CT angiogram of the chest on February 02, 2022 shows no evidence of pulmonary embolus and improvement in possible sarcoidosis.  No progressive or new nodularity.  Bronchial wall thickening in the lower lobes which can be seen with bronchitis or  reactive airway disease.   CURRENT THERAPY: observation  INTERVAL HISTORY:  Jeanne Evans 37 y.o. female returns for follow up. This is a telephone call to follow-up on the left elbow swelling and lump.  According to the patient miraculously this has resolved.  She continues to have intermittent hot flashes.  Rest of the pertinent 10 point ROS reviewed and negative  Patient Active Problem List   Diagnosis Date Noted   Port-A-Cath in place 03/11/2022   Acute respiratory failure with hypoxemia ([place]) 02/03/2022   Acute respiratory failure with hypoxia ([place]) 02/03/2022   Asthma 07/31/2021   Lymphedema of left arm 02/02/2021   Drug-induced neutropenia ([place]) 09/30/2020   Genetic testing 06/26/2020   Family history of ovarian cancer    Family history of prostate cancer    Malignant neoplasm of upper-outer quadrant of left breast in female, estrogen receptor negative ([place]) 06/03/2020    is allergic to sulfa antibiotics.  MEDICAL HISTORY: Past Medical History:  Diagnosis Date   Asthma 07/31/2021   Breast cancer ([place])    Family history of ovarian cancer    Family history of prostate cancer    GERD (gastroesophageal reflux disease)    History of asthma    has albuterol, uses PRN   History of heart murmur in childhood    History of multiple allergies     SURGICAL HISTORY: Past Surgical History:  Procedure Laterality Date   BREAST LUMPECTOMY WITH RADIOACTIVE SEED AND SENTINEL LYMPH NODE BIOPSY Left 11/19/2020   Procedure: LEFT BREAST LUMPECTOMY X 2 WITH RADIOACTIVE SEED AND SENTINEL LYMPH NODE MAPPING;  Surgeon: Erroll Luna, MD;  Location: [facility];  Service: General;  Laterality: Left;  PECTORAL BLOCK   BREAST REDUCTION SURGERY Right 10/27/2021   Procedure: MAMMARY REDUCTION  TO RIGHT BREAST;  Surgeon: [name], MD;  Location: [facility];  Service: Plastics;  Laterality: Right;   LIPOSUCTION WITH LIPOFILLING Bilateral 10/27/2021    Procedure: BILATERAL FAT GRAFTING TO BREAST;  Surgeon: [name], MD;  Location: [facility];  Service: Plastics;  Laterality: Bilateral;   PORTACATH PLACEMENT Right 06/18/2020   Procedure: INSERTION PORT-A-CATH WITH ULTRASOUND GUIDANCE;  Surgeon: Erroll Luna, MD;  Location: [facility];  Service: General;  Laterality: Right;   RE-EXCISION OF BREAST LUMPECTOMY Left 12/09/2020   Procedure: RE-EXCISION OF LEFT BREAST LUMPECTOMY;  Surgeon: Erroll Luna, MD;  Location: [facility];  Service: General;  Laterality: Left;    SOCIAL HISTORY: Social History   Socioeconomic History   Marital status: Single    Spouse name: Not on file   Number of children: Not on file   Years of education: Not on file   Highest education level: Not on file  Occupational History   Not on file  Tobacco Use   Smoking status: Never    Passive exposure: Never   Smokeless tobacco: Never  Vaping Use   Vaping Use: Never used  Substance and Sexual Activity   Alcohol use: Yes    Comment: occas   Drug use: Yes    Types: Marijuana    Comment: daily   Sexual activity: Not  on file  Other Topics Concern   Not on file  Social History Narrative   Not on file   Social Determinants of Health   Financial Resource Strain: High Risk (09/18/2021)   Overall Financial Resource Strain (CARDIA)    Difficulty of Paying Living Expenses: Very hard  Food Insecurity: Food Insecurity Present (09/18/2021)   Hunger Vital Sign    Worried About Running Out of Food in the Last Year: Sometimes true    Ran Out of Food in the Last Year: Sometimes true  Transportation Needs: No Transportation Needs (09/18/2021)   PRAPARE - Hydrologist (Medical): No    Lack of Transportation (Non-Medical): No  Physical Activity: Not on file  Stress: Stress Concern Present (09/18/2021)   [place]    Feeling of  Stress : Very much  Social Connections: Socially Isolated (09/18/2021)   Social Connection and Isolation Panel [NHANES]    Frequency of Communication with Friends and Family: More than three times a week    Frequency of Social Gatherings with Friends and Family: Once a week    Attends Religious Services: Never    Marine scientist or Organizations: No    Attends Music therapist: Never    Marital Status: Never married  Human resources officer Violence: Not on file    FAMILY HISTORY: Family History  Problem Relation Age of Onset   Asthma Mother    Asthma Brother    Lung cancer Maternal Uncle        dx late 21s   Prostate cancer Paternal Uncle        dx late 21s   Prostate cancer Paternal Uncle        dx early 45s   Ovarian cancer Paternal Grandmother        dx 22s   Prostate cancer Paternal Grandfather        dx 74s    Review of Systems  Constitutional:  Positive for fatigue. Negative for appetite change, chills, fever and unexpected weight change.  HENT:   Negative for hearing loss, lump/mass, mouth sores, sore throat and trouble swallowing.   Eyes:  Negative for eye problems and icterus.  Respiratory:  Negative for chest tightness, cough and shortness of breath.   Cardiovascular:  Negative for chest pain, leg swelling and palpitations.  Gastrointestinal:  Negative for abdominal distention, abdominal pain, constipation, diarrhea, nausea and vomiting.  Endocrine: Negative for hot flashes.  Genitourinary:  Negative for difficulty urinating.   Musculoskeletal:  Negative for arthralgias.  Skin:  Negative for itching and rash.  Neurological:  Negative for dizziness, extremity weakness, headaches and numbness.  Hematological:  Negative for adenopathy. Does not bruise/bleed easily.  Psychiatric/Behavioral:  Negative for depression. The patient is not nervous/anxious.       PHYSICAL EXAMINATION  ECOG PERFORMANCE STATUS: 1 - Symptomatic but completely  ambulatory  There were no vitals filed for this visit.  Telephone visit,  LABORATORY DATA:  CBC    Component Value Date/Time   WBC 4.8 06/04/2022 0928   WBC 4.8 05/20/2022 1252   RBC 4.55 06/04/2022 0928   RBC 4.28 05/20/2022 1252   HGB 12.6 06/04/2022 0928   HCT 40.1 06/04/2022 0928   PLT 290 06/04/2022 0928   MCV 88 06/04/2022 0928   MCH 27.7 06/04/2022 0928   MCH 28.7 05/20/2022 1252   MCHC 31.4 (L) 06/04/2022 0928   MCHC 33.7 05/20/2022 1252  RDW 14.5 06/04/2022 0928   LYMPHSABS 2.3 06/04/2022 0928   MONOABS 0.4 05/20/2022 1252   EOSABS 0.5 (H) 06/04/2022 0928   BASOSABS 0.0 06/04/2022 0928    CMP     Component Value Date/Time   NA 137 05/20/2022 1252   K 4.0 05/20/2022 1252   CL 104 05/20/2022 1252   CO2 28 05/20/2022 1252   GLUCOSE 92 05/20/2022 1252   BUN 18 05/20/2022 1252   CREATININE 0.99 05/20/2022 1252   CREATININE 1.03 (H) 10/14/2021 1356   CALCIUM 9.6 05/20/2022 1252   PROT 7.7 05/20/2022 1252   ALBUMIN 4.5 05/20/2022 1252   AST 18 05/20/2022 1252   AST 18 10/14/2021 1356   ALT 21 05/20/2022 1252   ALT 21 10/14/2021 1356   ALKPHOS 82 05/20/2022 1252   BILITOT 0.5 05/20/2022 1252   BILITOT 0.8 10/14/2021 1356   GFRNONAA >60 05/20/2022 1252   GFRNONAA >60 10/14/2021 1356   GFRAA >60 08/05/2020 0955   GFRAA >60 06/04/2020 0830    ASSESSMENT and THERAPY PLAN:   Malignant neoplasm of upper-outer quadrant of left breast in female, estrogen receptor negative ([place]) Ms. Russak is a 37 year old woman with 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 This is a follow-up call to monitor the left forearm swelling which she complained.  Patient tells me that this has since resolved.  She denies any other concerns at this time.  She was encouraged to contact us if needed and she expressed understanding.   Total encounter  time:5 minutes* chart review, counseling and documentation  Benay Pike MD I connected with  Jeanne Evans on 01/11/23 by a telephone application and verified that I am speaking with the correct person using two identifiers.   I discussed the limitations of evaluation and management by telemedicine. The patient expressed understanding and agreed to proceed.   *Total Encounter Time as defined by the Centers for Medicare and Medicaid Services includes, in addition to the face-to-face time of a patient visit (documented in the note above) non-face-to-face time: obtaining and reviewing outside history, ordering and reviewing medications, tests or procedures, care coordination (communications with other health care professionals or caregivers) and documentation in the medical record.

## 2023-01-11 NOTE — Assessment & Plan Note (Signed)
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 This is a follow-up call to monitor the left forearm swelling which she complained.  Patient tells me that this has since resolved.  She denies any other concerns at this time.  She was encouraged to contact us if needed and she expressed understanding.

## 2023-01-13 ENCOUNTER — Ambulatory Visit (INDEPENDENT_AMBULATORY_CARE_PROVIDER_SITE_OTHER): Payer: 59

## 2023-01-13 DIAGNOSIS — J4550 Severe persistent asthma, uncomplicated: Secondary | ICD-10-CM | POA: Diagnosis not present

## 2023-01-21 ENCOUNTER — Ambulatory Visit (INDEPENDENT_AMBULATORY_CARE_PROVIDER_SITE_OTHER): Payer: 59 | Admitting: Internal Medicine

## 2023-01-21 ENCOUNTER — Other Ambulatory Visit: Payer: Self-pay

## 2023-01-21 ENCOUNTER — Encounter: Payer: Self-pay | Admitting: Internal Medicine

## 2023-01-21 VITALS — BP 142/80 | HR 64 | Temp 98.6°F | Ht 62.0 in | Wt 197.9 lb

## 2023-01-21 DIAGNOSIS — J3089 Other allergic rhinitis: Secondary | ICD-10-CM | POA: Diagnosis not present

## 2023-01-21 DIAGNOSIS — H44113 Panuveitis, bilateral: Secondary | ICD-10-CM

## 2023-01-21 DIAGNOSIS — K219 Gastro-esophageal reflux disease without esophagitis: Secondary | ICD-10-CM | POA: Diagnosis not present

## 2023-01-21 DIAGNOSIS — L439 Lichen planus, unspecified: Secondary | ICD-10-CM | POA: Diagnosis not present

## 2023-01-21 DIAGNOSIS — J4550 Severe persistent asthma, uncomplicated: Secondary | ICD-10-CM

## 2023-01-21 NOTE — Progress Notes (Signed)
Follow Up Note  RE: Jeanne Evans MRN: [id number] DOB: Jan 02, 1986 Date of Office Visit: 01/21/2023  Referring provider: No ref. provider found Primary care provider: [name], MD  Chief Complaint: Asthma and Follow-up  History of Present Illness: I had the pleasure of seeing Jeanne Evans for a follow up visit at the [facility] on 01/21/2023. She is a 37 y.o. female, who is being followed for severe persistent asthma,, GERD, allergic rhinitis, lichen planus. Her previous allergy office visit was on 10/22/22 with Dr. Edison Pace. Today is a regular follow up visit.  History obtained from patient, chart review.  At last visit singulair and zyrtec stopped due to side effects and she was started on allegra '180mg'$  daily.  She was referred to dermatology for lichen planus   Today she reports  Asthma has been well-controlled on Trelegy 200 mcg 1 puff daily.  Needing her albuterol 1-2 times a month.  No side effects of Dupixent.  No antibiotics or systemic steroids since last visit.  She was seen by dermatology and received intralesional steroid injections for lichen planus.  At the same time she has developed blurred vision, pain, erythema of the right greater than left eye.  She was seen by [facility] ophthalmology and diagnosed with panuveitis.  She is currently on corticosteroid eyedrops and started on azathioprine 50 mg daily.  She has not seen rheumatology yet.  GERD is well-controlled on 40 mg twice daily.  Rhinitis is well-controlled on Flonase, Astelin daily.  She has tolerated Allegra although sometimes she uses Allegra-D with better effect and nasal congestion.  She does not know the difference tween Allegra and Allegra-D.    Pertinent History/Diagnostics:  - Asthma: severe persistent  -- Biologic Labs 05/2022: AEC 500, IgE 198 without perennial sensitizations  - Dupixent started 06/2022  -0 ED visits, 0 UC visits and 2 oral steroids in the past  year - (3/23) 1 number of lifetime hospitalizations, 0 number of lifetime intubations.  -Obstructive/restrictive defect spirometry (06/04/2022): ratio 65, 1.21 L, 49% FEV1 (pre), + 21% FEV1 (post)  CTPA 3/23: IMPRESSION: 1. No pulmonary embolus. 2. 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 The right perihilar perifissural nodule is smaller than on prior exam. No new or progressive nodularity. 3. Unchanged hilar adenopathy, right greater than left. 4. Bronchial wall thickening most prominent in the lower lobes, can be seen with bronchitis or reactive airways disease - Allergic Rhinitis:   - 06/04/2022: Specific IgE positive to [name] grass and mountain cedar    Assessment and Plan: [name] is a 37 y.o. female with: Severe persistent asthma without complication  Other allergic rhinitis  Lichen planus - Plan: Ambulatory referral to Rheumatology  Gastroesophageal reflux disease without esophagitis  Panuveitis of both eyes - Plan: Ambulatory referral to Rheumatology Plan: Patient Instructions  Uveitis  - Continue all prescription eye drops - Follow up with ophthalmology as planned  - I will place a referral to Rheuamotology (autoimmune doctor)  - I do not think this is the dupixent, but ask ophthalmology their thoughts    Severe Asthma: persistent: well controlled  - Biologic Labs 05/2022: AEC 500, IgE 198 without perennial sensitizations  - Dupixent started 06/2022   PLAN:  - Spacer not needed with current regimen. - Daily controller medication(s):  Trelegy 279mg - Prior to physical activity: albuterol 2 puffs 10-15 minutes before physical activity. - Rescue medications: albuterol 4 puffs every 4-6 hours as needed  -  Asthma control goals:  * Full participation in all desired activities (may need albuterol before activity) * Albuterol use two time or less a week on average (not counting use with activity) * Cough  interfering with sleep two time or less a month * Oral steroids no more than once a year * No hospitalizations    GERD  - Continue omeprazole to '40mg'$  twice daily  - Continue dietary and lifestyle modifications   Allergic Rhinitis:  - Previous testing:  06/04/2022: Specific IgE positive to [name] grass and mountain cedar - Continue with: Flonase (fluticasone) two sprays per nostril daily and Astelin (azelastine) 2 sprays per nostril 1-2 times daily as needed, Allera '180mg'$  daily- You can use an extra dose of the antihistamine, if needed, for breakthrough symptoms.  - Consider nasal saline rinses 1-2 times daily to remove allergens from the nasal cavities as well as help with mucous clearance (this is especially helpful to do before the nasal sprays are given) - Consider allergy shots as a means of long-term control and can reduce lifetime use of medications   Lichen planus  -Continue to follow up with dermatology  -Continue Clobetasol 0.5% ointment twice a day as needed to control symptoms  Follow up: 2 months   Thank you so much for letting me partake in your care today.  Don't hesitate to reach out if you have any additional concerns!  Roney Marion, MD  [facility] [place] [place] No follow-ups on file.  No orders of the defined types were placed in this encounter.   Lab Orders  No laboratory test(s) ordered today   Diagnostics: Spirometry:  Tracings reviewed. Her effort: It was hard to get consistent efforts and there is a question as to whether this reflects a maximal maneuver. FVC: 2.48L FEV1: 1.71L,69% predicted FEV1/FVC ratio: 69% Interpretation: No overt abnormalities noted given today's efforts. (Much improved from prior)  Please see scanned spirometry results for details.  Results interpreted by myself during this encounter and discussed with patient/family.   Medication List:  Current Outpatient Medications  Medication Sig Dispense Refill    Acetaminophen 325 MG CAPS Take by mouth.     albuterol (VENTOLIN HFA) 108 (90 Base) MCG/ACT inhaler Inhale 2 puffs into the lungs every 6 (six) hours as needed for wheezing or shortness of breath. 8 g 6   azaTHIOprine (IMURAN) 50 MG tablet Take 50 mg by mouth daily.     azelastine (ASTELIN) 0.1 % nasal spray Place 2 sprays into both nostrils 2 (two) times daily. Use in each nostril as directed 30 mL 6   clobetasol ointment (TEMOVATE) [id number] % Apply 1 Application topically 2 (two) times daily. 30 g 0   dorzolamide-timolol (COSOPT) 2-0.5 % ophthalmic solution Place 1 drop into both eyes 2 (two) times daily.     fluticasone (FLONASE) 50 MCG/ACT nasal spray Place 2 sprays into both nostrils daily. 16 g 2   Fluticasone-Umeclidin-Vilant (TRELEGY ELLIPTA) 200-62.5-25 MCG/ACT AEPB Inhale 1 puff into the lungs daily. 1 each 6   ipratropium (ATROVENT) 0.06 % nasal spray 1 spray each nostril up to 3 times daily as needed for runny nose. 15 mL 5   levonorgestrel (MIRENA, 52 MG,) 20 MCG/DAY IUD by Intrauterine route.     montelukast (SINGULAIR) 10 MG tablet Take 10 mg by mouth as needed.     naphazoline-pheniramine (NAPHCON-A) 0.025-0.3 % ophthalmic solution Place 1 drop into both eyes 4 (four) times daily as needed.     ofloxacin (OCUFLOX)  0.3 % ophthalmic solution Place 1 drop into both eyes 4 (four) times daily.     Olopatadine-Mometasone (RYALTRIS) [id number] MCG/ACT SUSP Place 1 spray into the nose daily. 29 g 5   omeprazole (PRILOSEC) 40 MG capsule Take 1 capsule (40 mg total) by mouth daily. First thing in the morning on an empty stomach 30 capsule 3   prednisoLONE acetate (PRED FORTE) 1 % ophthalmic suspension Place 1 drop into both eyes every 4 (four) hours.     predniSONE (DELTASONE) 10 MG tablet Take 10 mg by mouth daily with breakfast.     tacrolimus (PROTOPIC) 0.1 % ointment Apply topically 2 (two) times daily.     fluticasone furoate-vilanterol (BREO ELLIPTA) 100-25 MCG/ACT AEPB Inhale 1 puff into the  lungs daily. (Patient not taking: Reported on 07/02/2022) 60 each 5   gabapentin (NEURONTIN) 300 MG capsule Take 1 capsule (300 mg total) by mouth at bedtime. (Patient not taking: Reported on 01/21/2023) 90 capsule 4   lidocaine-prilocaine (EMLA) cream Apply 1 application. topically as needed (for port access). (Patient not taking: Reported on 01/21/2023)     ondansetron (ZOFRAN-ODT) 4 MG disintegrating tablet Take 1 tablet (4 mg total) by mouth every 8 (eight) hours as needed for nausea or vomiting. (Patient not taking: Reported on 01/21/2023) 20 tablet 0   Current Facility-Administered Medications  Medication Dose Route Frequency Provider Last Rate Last Admin   dupilumab (DUPIXENT) prefilled syringe 300 mg  300 mg Subcutaneous Q14 Days Kennith Gain, MD   300 mg at 01/13/23 [id number]   Allergies: Allergies  Allergen Reactions   Sulfa Antibiotics Hives   I reviewed her past medical history, social history, family history, and environmental history and no significant changes have been reported from her previous visit.  ROS: All others negative except as noted per HPI.   Objective: BP (!) 142/80   Pulse 64   Temp 98.6 F (37 C) (Temporal)   Ht '5\' 2"'$  (1.575 m)   Wt 197 lb 14.4 oz (89.8 kg)   SpO2 99%   BMI 36.20 kg/m  Body mass index is 36.2 kg/m. General Appearance:  Alert, cooperative, no distress, appears stated age  Head:  Normocephalic, without obvious abnormality, atraumatic  Eyes:  Erythema of right eye , EOM's intact  Nose: Nares normal, normal mucosa, no visible anterior polyps, and septum midline  Throat: Lips, tongue normal; teeth and gums normal, normal posterior oropharynx and no tonsillar exudate  Neck: Supple, symmetrical  Lungs:   clear to auscultation bilaterally, Respirations unlabored, no coughing  Heart:  regular rate and rhythm and no murmur, Appears well perfused  Extremities: No edema  Skin: Skin color, texture, turgor normal,"purple flat patches on knee in  shin of right leg.  Associated with some silver scaling.  Polygonal in shape.  Neurologic: No gross deficits   Previous notes and tests were reviewed. The plan was reviewed with the patient/family, and all questions/concerned were addressed.  It was my pleasure to see Orletta today and participate in her care. Please feel free to contact me with any questions or concerns.  Sincerely,  Roney Marion, MD  Allergy & Immunology  [facility] Office: [phone]

## 2023-01-21 NOTE — Addendum Note (Signed)
Addended by: Alvin Critchley, Branch Pacitti on: 01/21/2023 04:17 PM   Modules accepted: Orders

## 2023-01-21 NOTE — Patient Instructions (Addendum)
Uveitis  - Continue all prescription eye drops - Follow up with ophthalmology as planned  - I will place a referral to Rheuamotology (autoimmune doctor)  - I do not think this is the dupixent, but ask ophthalmology their thoughts    Severe Asthma: persistent: well controlled  - Biologic Labs 05/2022: AEC 500, IgE 198 without perennial sensitizations  - Dupixent started 06/2022   PLAN:  - Spacer not needed with current regimen. - Daily controller medication(s):  Trelegy 222mg - Prior to physical activity: albuterol 2 puffs 10-15 minutes before physical activity. - Rescue medications: albuterol 4 puffs every 4-6 hours as needed  - Asthma control goals:  * Full participation in all desired activities (may need albuterol before activity) * Albuterol use two time or less a week on average (not counting use with activity) * Cough interfering with sleep two time or less a month * Oral steroids no more than once a year * No hospitalizations    GERD  - Continue omeprazole to '40mg'$  twice daily  - Continue dietary and lifestyle modifications   Allergic Rhinitis:  - Previous testing:  06/04/2022: Specific IgE positive to [name] grass and mountain cedar - Continue with: Flonase (fluticasone) two sprays per nostril daily and Astelin (azelastine) 2 sprays per nostril 1-2 times daily as needed, Allera '180mg'$  daily- You can use an extra dose of the antihistamine, if needed, for breakthrough symptoms.  - Consider nasal saline rinses 1-2 times daily to remove allergens from the nasal cavities as well as help with mucous clearance (this is especially helpful to do before the nasal sprays are given) - Consider allergy shots as a means of long-term control and can reduce lifetime use of medications   Lichen planus  -Continue to follow up with dermatology  -Continue Clobetasol 0.5% ointment twice a day as needed to control symptoms  Follow up: 2 months   Thank you so much for letting me partake in  your care today.  Don't hesitate to reach out if you have any additional concerns!  ERoney Marion MD  [facility]

## 2023-01-21 NOTE — Addendum Note (Signed)
Addended by: Alvin Critchley, Annell Canty on: 01/21/2023 03:29 PM   Modules accepted: Orders

## 2023-01-25 ENCOUNTER — Other Ambulatory Visit: Payer: Self-pay | Admitting: Internal Medicine

## 2023-01-27 ENCOUNTER — Ambulatory Visit (INDEPENDENT_AMBULATORY_CARE_PROVIDER_SITE_OTHER): Payer: 59

## 2023-01-27 DIAGNOSIS — J4550 Severe persistent asthma, uncomplicated: Secondary | ICD-10-CM | POA: Diagnosis not present

## 2023-02-10 ENCOUNTER — Ambulatory Visit (INDEPENDENT_AMBULATORY_CARE_PROVIDER_SITE_OTHER): Payer: 59

## 2023-02-10 DIAGNOSIS — J4550 Severe persistent asthma, uncomplicated: Secondary | ICD-10-CM

## 2023-02-15 ENCOUNTER — Encounter: Payer: Self-pay | Admitting: Psychiatry

## 2023-02-15 ENCOUNTER — Ambulatory Visit (INDEPENDENT_AMBULATORY_CARE_PROVIDER_SITE_OTHER): Payer: 59 | Admitting: Psychiatry

## 2023-02-15 VITALS — BP 136/91 | HR 70 | Ht 62.0 in | Wt 201.0 lb

## 2023-02-15 DIAGNOSIS — G43009 Migraine without aura, not intractable, without status migrainosus: Secondary | ICD-10-CM

## 2023-02-15 MED ORDER — RIZATRIPTAN BENZOATE 10 MG PO TABS: 10.0000 mg | ORAL_TABLET | ORAL | 6 refills | Status: DC | PRN

## 2023-02-15 NOTE — Progress Notes (Signed)
Referring:  Benay Pike, MD [address] [place],  [place] [address]  PCP: [name], MD  Neurology was asked to evaluate Jeanne Evans, a 37 year old female for a chief complaint of headaches.  Our recommendations of care will be communicated by shared medical record.    CC:  headaches  History provided from self  HPI:  Medical co-morbidities: asthma, breast cancer s/p chemo and lumpectomy, granulomatous uveitis, kidney stones  The patient presents for evaluation of headaches which began 5 months ago. Headaches began after she started chemo/radiation for breast cancer. They are associated with photophobia and phonophobia. Can last 2-3 hours at a time. Initially they were occurring 2-3 times per week, but they have improved in frequency since she started prednisone and anti-inflammatories for uveitis. Her last headache was 3 weeks ago.   MRI brain 12/20/22 was suggestive of sinusitis and was otherwise unremarkable.  Headache History: Onset: 5 months ago Triggers: none, woke up with them Aura: none Location: frontal, retro-orbital Quality/Description: pressure, throbbing Associated Symptoms:  Photophobia: yes  Phonophobia: yes  Nausea: no Worse with activity?: yes Duration of headaches: 2 hours  Migraine days per month: 1 Headache free days per month: 29  Current Treatment: Abortive Ibuprofen tylenol  Preventative none  Prior Therapies                                 Ibuprofen tylenol   LABS: CBC    Component Value Date/Time   WBC 4.8 06/04/2022 0928   WBC 4.8 05/20/2022 1252   RBC 4.55 06/04/2022 0928   RBC 4.28 05/20/2022 1252   HGB 12.6 06/04/2022 0928   HCT 40.1 06/04/2022 0928   PLT 290 06/04/2022 0928   MCV 88 06/04/2022 0928   MCH 27.7 06/04/2022 0928   MCH 28.7 05/20/2022 1252   MCHC 31.4 (L) 06/04/2022 0928   MCHC 33.7 05/20/2022 1252   RDW 14.5 06/04/2022 0928   LYMPHSABS 2.3 06/04/2022 0928   MONOABS 0.4 05/20/2022 1252   EOSABS  0.5 (H) 06/04/2022 0928   BASOSABS 0.0 06/04/2022 0928      Latest Ref Rng & Units 05/20/2022   12:52 PM 04/23/2022    9:47 AM 04/02/2022   11:32 AM  CMP  Glucose 70 - 99 mg/dL 92  139  110   BUN 6 - 20 mg/dL 18  16  13    Creatinine 0.44 - 1.00 mg/dL 0.99  0.79  0.81   Sodium 135 - 145 mmol/L 137  139  140   Potassium 3.5 - 5.1 mmol/L 4.0  4.0  3.9   Chloride 98 - 111 mmol/L 104  108  108   CO2 22 - 32 mmol/L 28  24  27    Calcium 8.9 - 10.3 mg/dL 9.6  9.1  8.9   Total Protein 6.5 - 8.1 g/dL 7.7  7.0  7.2   Total Bilirubin 0.3 - 1.2 mg/dL 0.5  0.5  0.5   Alkaline Phos 38 - 126 U/L 82  81  86   AST 15 - 41 U/L 18  17  18    ALT 0 - 44 U/L 21  21  21       IMAGING:  MRI brain 12/20/22: 1. No evidence of intracranial metastatic disease. 2. Very few punctate foci of T2 hyperintensity within the white matter of the cerebral hemispheres, nonspecific. Differential diagnosis include migraine, demyelinating disease and sequela of prior inflammatory/infectious  processes. 3. Bubbly secretion with fluid level within the bilateral sphenoid sinuses. Correlate clinically for acute sinusitis.  Imaging independently reviewed on February 15, 2023   Current Outpatient Medications on File Prior to Visit  Medication Sig Dispense Refill   Acetaminophen 325 MG CAPS Take by mouth.     albuterol (VENTOLIN HFA) 108 (90 Base) MCG/ACT inhaler INHALE 2 PUFFS INTO THE LUNGS EVERY 6 HOURS AS NEEDED FOR WHEEZING OR SHORTNESS OF BREATH 6.7 g 1   azaTHIOprine (IMURAN) 50 MG tablet Take 50 mg by mouth daily.     azelastine (ASTELIN) 0.1 % nasal spray Place 2 sprays into both nostrils 2 (two) times daily. Use in each nostril as directed 30 mL 6   clobetasol ointment (TEMOVATE) [id number] % Apply 1 Application topically 2 (two) times daily. 30 g 0   dorzolamide-timolol (COSOPT) 2-0.5 % ophthalmic solution Place 1 drop into both eyes 2 (two) times daily.     fluticasone (FLONASE) 50 MCG/ACT nasal spray Place 2 sprays into both  nostrils daily. 16 g 2   fluticasone furoate-vilanterol (BREO ELLIPTA) 100-25 MCG/ACT AEPB Inhale 1 puff into the lungs daily. 60 each 5   Fluticasone-Umeclidin-Vilant (TRELEGY ELLIPTA) 200-62.5-25 MCG/ACT AEPB Inhale 1 puff into the lungs daily. 1 each 6   ipratropium (ATROVENT) 0.06 % nasal spray 1 spray each nostril up to 3 times daily as needed for runny nose. 15 mL 5   levonorgestrel (MIRENA, 52 MG,) 20 MCG/DAY IUD by Intrauterine route.     lidocaine-prilocaine (EMLA) cream Apply 1 application  topically as needed (for port access).     montelukast (SINGULAIR) 10 MG tablet Take 10 mg by mouth as needed.     naphazoline-pheniramine (NAPHCON-A) 0.025-0.3 % ophthalmic solution Place 1 drop into both eyes 4 (four) times daily as needed.     ofloxacin (OCUFLOX) 0.3 % ophthalmic solution Place 1 drop into both eyes 4 (four) times daily.     Olopatadine-Mometasone (RYALTRIS) [id number] MCG/ACT SUSP Place 1 spray into the nose daily. 29 g 5   omeprazole (PRILOSEC) 40 MG capsule Take 1 capsule (40 mg total) by mouth daily. First thing in the morning on an empty stomach 30 capsule 3   prednisoLONE acetate (PRED FORTE) 1 % ophthalmic suspension Place 1 drop into both eyes every 4 (four) hours.     tacrolimus (PROTOPIC) 0.1 % ointment Apply topically 2 (two) times daily.     Current Facility-Administered Medications on File Prior to Visit  Medication Dose Route Frequency Provider Last Rate Last Admin   dupilumab (DUPIXENT) prefilled syringe 300 mg  300 mg Subcutaneous Q14 Days Kennith Gain, MD   300 mg at 02/10/23 [id number]     Allergies: Allergies  Allergen Reactions   Sulfa Antibiotics Hives    Family History: Family History  Problem Relation Age of Onset   Asthma Mother    High blood pressure Mother    High blood pressure Father    High Cholesterol Father    Asthma Brother    Ovarian cancer Paternal Grandmother        dx 59s   Prostate cancer Paternal Grandfather        dx 34s    Lung cancer Maternal Uncle        dx late 73s   Prostate cancer Paternal Uncle        dx late 60s   Prostate cancer Paternal Uncle        dx early 33s     Past Medical  History: Past Medical History:  Diagnosis Date   Asthma 07/31/2021   Breast cancer    Family history of ovarian cancer    Family history of prostate cancer    GERD (gastroesophageal reflux disease)    History of asthma    has albuterol, uses PRN   History of heart murmur in childhood    History of multiple allergies     Past Surgical History Past Surgical History:  Procedure Laterality Date   BREAST LUMPECTOMY WITH RADIOACTIVE SEED AND SENTINEL LYMPH NODE BIOPSY Left 11/19/2020   Procedure: LEFT BREAST LUMPECTOMY X 2 WITH RADIOACTIVE SEED AND SENTINEL LYMPH NODE MAPPING;  Surgeon: Erroll Luna, MD;  Location: [facility];  Service: General;  Laterality: Left;  PECTORAL BLOCK   BREAST REDUCTION SURGERY Right 10/27/2021   Procedure: MAMMARY REDUCTION  TO RIGHT BREAST;  Surgeon: [name], MD;  Location: [facility];  Service: Plastics;  Laterality: Right;   LIPOSUCTION WITH LIPOFILLING Bilateral 10/27/2021   Procedure: BILATERAL FAT GRAFTING TO BREAST;  Surgeon: [name], MD;  Location: [facility];  Service: Plastics;  Laterality: Bilateral;   PORTACATH PLACEMENT Right 06/18/2020   Procedure: INSERTION PORT-A-CATH WITH ULTRASOUND GUIDANCE;  Surgeon: Erroll Luna, MD;  Location: [facility];  Service: General;  Laterality: Right;   RE-EXCISION OF BREAST LUMPECTOMY Left 12/09/2020   Procedure: RE-EXCISION OF LEFT BREAST LUMPECTOMY;  Surgeon: Erroll Luna, MD;  Location: [facility];  Service: General;  Laterality: Left;    Social History: Social History   Tobacco Use   Smoking status: Never    Passive exposure: Never   Smokeless tobacco: Never  Vaping Use   Vaping Use: Never used  Substance Use Topics   Alcohol use: Yes     Comment: socially   Drug use: Yes    Types: Marijuana    Comment: occass    ROS: Negative for fevers, chills. Positive for headaches. All other systems reviewed and negative unless stated otherwise in HPI.   Physical Exam:   Vital Signs: BP (!) 136/91 (BP Location: Right Arm, Patient Position: Sitting, Cuff Size: Large)   Pulse 70   Ht 5\' 2"  (1.575 m)   Wt 201 lb (91.2 kg)   BMI 36.76 kg/m  GENERAL: well appearing,in no acute distress,alert SKIN:  Color, texture, turgor normal. No rashes or lesions HEAD:  Normocephalic/atraumatic. CV:  RRR RESP: Normal respiratory effort MSK: +tenderness to palpation over bilateral occiput, neck, and shoulders  NEUROLOGICAL: Mental Status: Alert, oriented to person, place and time,Follows commands Cranial Nerves: PERRL, visual fields intact to confrontation, extraocular movements intact, facial sensation intact, no facial droop or ptosis, hearing grossly intact, no dysarthria Motor: muscle strength 5/5 both upper and lower extremities,no drift, normal tone Reflexes: 2+ throughout Sensation: intact to light touch all 4 extremities Coordination: Finger-to- nose-finger intact bilaterally Gait: normal-based   IMPRESSION: 37 year old female with a history of asthma, breast cancer s/p chemo and lumpectomy, granulomatous uveitis who presents for evaluation of new headaches which began following radiation and chemotherapy 5 months ago. MRI brain was unremarkable. Her headaches have improved since she started treatment for uveitis. Will monitor to see if headaches continue to improve with uveitis treatment. Maxalt prescribed for migraine rescue if headaches do return.  PLAN: -Start Maxalt 10 mg PRN for migraine rescue if headaches return -Next steps: consider daily preventive if headaches increase in frequency   I spent a total of 28 minutes chart  reviewing and counseling the patient. Headache education was done. Discussed treatment options  including preventive and acute medications. Discussed medication side effects, adverse reactions and drug interactions. Written educational materials and patient instructions outlining all of the above were given.  Follow-up: 6 months   Genia Harold, MD 02/15/2023   3:23 PM

## 2023-02-24 ENCOUNTER — Ambulatory Visit (INDEPENDENT_AMBULATORY_CARE_PROVIDER_SITE_OTHER): Payer: 59

## 2023-02-24 DIAGNOSIS — J4550 Severe persistent asthma, uncomplicated: Secondary | ICD-10-CM | POA: Diagnosis not present

## 2023-03-04 ENCOUNTER — Other Ambulatory Visit: Payer: Self-pay

## 2023-03-04 ENCOUNTER — Ambulatory Visit
Admission: EM | Admit: 2023-03-04 | Discharge: 2023-03-04 | Disposition: A | Discharge status: Home / Self Care | Payer: 59 | Attending: Emergency Medicine | Admitting: Emergency Medicine

## 2023-03-04 DIAGNOSIS — T148XXA Other injury of unspecified body region, initial encounter: Secondary | ICD-10-CM

## 2023-03-04 DIAGNOSIS — M545 Low back pain, unspecified: Secondary | ICD-10-CM | POA: Diagnosis not present

## 2023-03-04 MED ORDER — KETOROLAC TROMETHAMINE 30 MG/ML IJ SOLN
30.0000 mg | Freq: Once | INTRAMUSCULAR | Status: AC
  Administered 2023-03-04: 30 mg via INTRAMUSCULAR

## 2023-03-04 MED ORDER — METHOCARBAMOL 500 MG PO TABS: 500.0000 mg | ORAL_TABLET | Freq: Two times a day (BID) | ORAL | 0 refills | Status: DC

## 2023-03-04 MED ORDER — PREDNISONE 10 MG PO TABS: 20.0000 mg | ORAL_TABLET | Freq: Every day | ORAL | 0 refills | Status: DC

## 2023-03-04 NOTE — Discharge Instructions (Addendum)
Continue to take ibuprofen or Tylenol as needed for pain Do not drive while taking the Robaxin this may be sleepy If symptoms persist you will need to see orthopedic [facility] also has a walk-in urgent care you can be seen there. Use warm compresses heating pad to help with discomfort

## 2023-03-04 NOTE — ED Provider Notes (Signed)
[facility]    CSN: [id number] Arrival date & time: 03/04/23  1134      History   Chief Complaint Chief Complaint  Patient presents with   Back Pain    HPI Jeanne Evans is a 37 y.o. female.   Patient presents today with left lower back flank pain after moving.  Patient denies any midline pain nor any urinary symptoms.  Denies any known injury.  States that she has been taking ibuprofen with minimal relief.    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   Lichen planus 01/21/2023   Gastroesophageal reflux disease without esophagitis 01/21/2023   Other allergic rhinitis 01/21/2023   Port-A-Cath in place 03/11/2022   Acute respiratory failure with hypoxemia 02/03/2022   Acute respiratory failure with hypoxia 02/03/2022   Asthma 07/31/2021   Lymphedema of left arm 02/02/2021   Drug-induced neutropenia 09/30/2020   Genetic testing 06/26/2020   Family history of ovarian cancer    Family history of prostate cancer    Malignant neoplasm of upper-outer quadrant of left breast in female, estrogen receptor negative 06/03/2020    Past Surgical History:  Procedure Laterality Date   BREAST LUMPECTOMY WITH RADIOACTIVE SEED AND SENTINEL LYMPH NODE BIOPSY Left 11/19/2020   Procedure: LEFT BREAST LUMPECTOMY X 2 WITH RADIOACTIVE SEED AND SENTINEL LYMPH NODE MAPPING;  Surgeon: Harriette Bouillon, MD;  Location: [facility];  Service: General;  Laterality: Left;  PECTORAL BLOCK   BREAST REDUCTION SURGERY Right 10/27/2021   Procedure: MAMMARY REDUCTION  TO RIGHT BREAST;  Surgeon: Allena Napoleon, MD;   Location: [facility];  Service: Plastics;  Laterality: Right;   LIPOSUCTION WITH LIPOFILLING Bilateral 10/27/2021   Procedure: BILATERAL FAT GRAFTING TO BREAST;  Surgeon: Allena Napoleon, MD;  Location: [facility];  Service: Plastics;  Laterality: Bilateral;   PORTACATH PLACEMENT Right 06/18/2020   Procedure: INSERTION PORT-A-CATH WITH ULTRASOUND GUIDANCE;  Surgeon: Harriette Bouillon, MD;  Location: [facility];  Service: General;  Laterality: Right;   RE-EXCISION OF BREAST LUMPECTOMY Left 12/09/2020   Procedure: RE-EXCISION OF LEFT BREAST LUMPECTOMY;  Surgeon: Harriette Bouillon, MD;  Location: [facility];  Service: General;  Laterality: Left;    OB History   No obstetric history on file.      Home Medications    Prior to Admission medications   Medication Sig Start Date End Date Taking? Authorizing Provider  methocarbamol (ROBAXIN) 500 MG tablet Take 1 tablet (500 mg total) by mouth 2 (two) times daily. 03/04/23  Yes Coralyn Mark, NP  predniSONE (DELTASONE) 10 MG tablet Take 2 tablets (20 mg total) by mouth daily. 03/04/23  Yes Coralyn Mark, NP  Acetaminophen 325 MG CAPS Take by mouth.    [provider]  albuterol (VENTOLIN HFA) 108 (90 Base) MCG/ACT inhaler INHALE 2 PUFFS INTO THE LUNGS EVERY 6 HOURS AS NEEDED FOR WHEEZING OR SHORTNESS OF BREATH 01/26/23   Kalman Shan, MD  azaTHIOprine (IMURAN) 50 MG tablet Take 50 mg by  mouth daily. 01/18/23   [provider]  azelastine (ASTELIN) 0.1 % nasal spray Place 2 sprays into both nostrils 2 (two) times daily. Use in each nostril as directed 06/04/22   Ferol Luz, MD  clobetasol ointment (TEMOVATE) 0.05 % Apply 1 Application topically 2 (two) times daily. 10/22/22   Ferol Luz, MD  dorzolamide-timolol (COSOPT) 2-0.5 % ophthalmic solution Place 1 drop into both eyes 2 (two) times daily.    [provider]  fluticasone (FLONASE) 50 MCG/ACT  nasal spray Place 2 sprays into both nostrils daily. 06/04/22   Ferol Luz, MD  fluticasone furoate-vilanterol (BREO ELLIPTA) 100-25 MCG/ACT AEPB Inhale 1 puff into the lungs daily. 03/11/22   Kalman Shan, MD  Fluticasone-Umeclidin-Vilant (TRELEGY ELLIPTA) 200-62.5-25 MCG/ACT AEPB Inhale 1 puff into the lungs daily. 06/04/22   Ferol Luz, MD  ipratropium (ATROVENT) 0.06 % nasal spray 1 spray each nostril up to 3 times daily as needed for runny nose. 07/20/22   Ferol Luz, MD  levonorgestrel (MIRENA, 52 MG,) 20 MCG/DAY IUD by Intrauterine route.    [provider]  lidocaine-prilocaine (EMLA) cream Apply 1 application  topically as needed (for port access). 09/17/21   [provider]  montelukast (SINGULAIR) 10 MG tablet Take 10 mg by mouth as needed. 03/11/22   [provider]  naphazoline-pheniramine (NAPHCON-A) 0.025-0.3 % ophthalmic solution Place 1 drop into both eyes 4 (four) times daily as needed. 12/31/22   [provider]  ofloxacin (OCUFLOX) 0.3 % ophthalmic solution Place 1 drop into both eyes 4 (four) times daily. 12/31/22   [provider]  Olopatadine-Mometasone (Cristal Generous) [phone] MCG/ACT SUSP Place 1 spray into the nose daily. 07/02/22   Ferol Luz, MD  omeprazole (PRILOSEC) 40 MG capsule Take 1 capsule (40 mg total) by mouth daily. First thing in the morning on an empty stomach 02/18/22   Causey, Larna Daughters, NP  prednisoLONE acetate (PRED FORTE) 1 % ophthalmic suspension Place 1 drop into both eyes every 4 (four) hours. 01/18/23   [provider]  rizatriptan (MAXALT) 10 MG tablet Take 1 tablet (10 mg total) by mouth as needed for migraine. May repeat in 2 hours if needed 02/15/23   Ocie Doyne, MD  tacrolimus (PROTOPIC) 0.1 % ointment Apply topically 2 (two) times daily. 11/03/22   [provider]    Family History Family History  Problem Relation Age of Onset   Asthma Mother    High blood  pressure Mother    High blood pressure Father    High Cholesterol Father    Asthma Brother    Ovarian cancer Paternal Grandmother        dx 47s   Prostate cancer Paternal Grandfather        dx 30s   Lung cancer Maternal Uncle        dx late 44s   Prostate cancer Paternal Uncle        dx late 4s   Prostate cancer Paternal Uncle        dx early 68s    Social History Social History   Tobacco Use   Smoking status: Never    Passive exposure: Never   Smokeless tobacco: Never  Vaping Use   Vaping Use: Never used  Substance Use Topics   Alcohol use: Yes    Comment: socially   Drug use: Yes    Types: Marijuana    Comment: occass     Allergies   Sulfa antibiotics   Review of Systems Review of  Systems  Constitutional: Negative.   Respiratory: Negative.    Cardiovascular: Negative.   Genitourinary: Negative.   Musculoskeletal:  Positive for back pain.       Lower back/flank area pain with movement only  Skin: Negative.   Neurological:  Negative for numbness.     Physical Exam Triage Vital Signs ED Triage Vitals  Enc Vitals Group     BP 03/04/23 1142 (!) 137/90     Pulse Rate 03/04/23 1142 89     Resp 03/04/23 1142 15     Temp 03/04/23 1142 98 F (36.7 C)     Temp src --      SpO2 03/04/23 1142 98 %     Weight --      Height --      Head Circumference --      Peak Flow --      Pain Score 03/04/23 1140 9     Pain Loc --      Pain Edu? --      Excl. in GC? --    No data found.  Updated Vital Signs BP (!) 137/90   Pulse 89   Temp 98 F (36.7 C)   Resp 15   LMP  (LMP Unknown)   SpO2 98%   Visual Acuity Right Eye Distance:   Left Eye Distance:   Bilateral Distance:    Right Eye Near:   Left Eye Near:    Bilateral Near:     Physical Exam Constitutional:      Appearance: Normal appearance.  Cardiovascular:     Rate and Rhythm: Normal rate.  Pulmonary:     Effort: Pulmonary effort is normal.  Abdominal:     General: Abdomen is flat.   Musculoskeletal:        General: Tenderness present. Normal range of motion.     Comments: Tenderness to palpation to left lower back lateral left side upon movement and palpation only.  No urinary symptoms.  Full range of motion.  Neurological:     Mental Status: She is alert.      UC Treatments / Results  Labs (all labs ordered are listed, but only abnormal results are displayed) Labs Reviewed - No data to display  EKG   Radiology No results found.  Procedures Procedures (including critical care time)  Medications Ordered in UC Medications  ketorolac (TORADOL) 30 MG/ML injection 30 mg (30 mg Intramuscular Given 03/04/23 1155)    Initial Impression / Assessment and Plan / UC Course  I have reviewed the triage vital signs and the nursing notes.  Pertinent labs & imaging results that were available during my care of the patient were reviewed by me and considered in my medical decision making (see chart for details).     Continue to take ibuprofen or Tylenol as needed for pain Do not drive while taking the Robaxin this may be sleepy If symptoms persist you will need to see orthopedic [facility] also has a walk-in urgent care you can be seen there. Use warm compresses heating pad to help with discomfort Final Clinical Impressions(s) / UC Diagnoses   Final diagnoses:  Muscle strain  Acute left-sided low back pain without sciatica     Discharge Instructions      Continue to take ibuprofen or Tylenol as needed for pain Do not drive while taking the Robaxin this may be sleepy If symptoms persist you will need to see orthopedic [facility] also has a walk-in urgent care you can be  seen there. Use warm compresses heating pad to help with discomfort     ED Prescriptions     Medication Sig Dispense Auth. Provider   methocarbamol (ROBAXIN) 500 MG tablet Take 1 tablet (500 mg total) by mouth 2 (two) times daily. 20 tablet Maple Mirza L, NP   predniSONE  (DELTASONE) 10 MG tablet Take 2 tablets (20 mg total) by mouth daily. 15 tablet Coralyn Mark, NP      PDMP not reviewed this encounter.   Coralyn Mark, NP 03/04/23 1158

## 2023-03-04 NOTE — ED Triage Notes (Addendum)
Pt presents to [facility] with co of left lower back pain since Monday. Pt reports no dysuria, has been using motrin at home for pain. Pt reports she has been moving so she has been unloading a lot of boxes but not sure if she injured herself.

## 2023-03-09 ENCOUNTER — Ambulatory Visit (INDEPENDENT_AMBULATORY_CARE_PROVIDER_SITE_OTHER): Payer: 59

## 2023-03-09 DIAGNOSIS — J4550 Severe persistent asthma, uncomplicated: Secondary | ICD-10-CM

## 2023-03-23 ENCOUNTER — Ambulatory Visit (INDEPENDENT_AMBULATORY_CARE_PROVIDER_SITE_OTHER): Payer: 59 | Admitting: *Deleted

## 2023-03-23 DIAGNOSIS — J4550 Severe persistent asthma, uncomplicated: Secondary | ICD-10-CM | POA: Diagnosis not present

## 2023-04-07 ENCOUNTER — Ambulatory Visit (INDEPENDENT_AMBULATORY_CARE_PROVIDER_SITE_OTHER): Payer: 59

## 2023-04-07 DIAGNOSIS — J4550 Severe persistent asthma, uncomplicated: Secondary | ICD-10-CM | POA: Diagnosis not present

## 2023-04-15 ENCOUNTER — Other Ambulatory Visit: Payer: Self-pay

## 2023-04-15 ENCOUNTER — Ambulatory Visit (INDEPENDENT_AMBULATORY_CARE_PROVIDER_SITE_OTHER): Payer: 59 | Admitting: Internal Medicine

## 2023-04-15 ENCOUNTER — Encounter: Payer: Self-pay | Admitting: Internal Medicine

## 2023-04-15 VITALS — BP 122/70 | HR 74 | Temp 98.2°F | Resp 16 | Ht 63.5 in | Wt 201.4 lb

## 2023-04-15 DIAGNOSIS — J4550 Severe persistent asthma, uncomplicated: Secondary | ICD-10-CM | POA: Diagnosis not present

## 2023-04-15 DIAGNOSIS — J3089 Other allergic rhinitis: Secondary | ICD-10-CM | POA: Diagnosis not present

## 2023-04-15 DIAGNOSIS — K219 Gastro-esophageal reflux disease without esophagitis: Secondary | ICD-10-CM

## 2023-04-15 DIAGNOSIS — H44113 Panuveitis, bilateral: Secondary | ICD-10-CM | POA: Diagnosis not present

## 2023-04-15 DIAGNOSIS — Z923 Personal history of irradiation: Secondary | ICD-10-CM

## 2023-04-15 DIAGNOSIS — L439 Lichen planus, unspecified: Secondary | ICD-10-CM

## 2023-04-15 DIAGNOSIS — Z853 Personal history of malignant neoplasm of breast: Secondary | ICD-10-CM

## 2023-04-15 MED ORDER — TACROLIMUS 0.1 % EX OINT: TOPICAL_OINTMENT | Freq: Two times a day (BID) | CUTANEOUS | 5 refills | Status: AC

## 2023-04-15 NOTE — Progress Notes (Addendum)
Follow Up Note  RE: Jeanne BARCA MRN: [id number] DOB: 1986-07-05 Date of Office Visit: 04/15/2023  Referring provider: [name], MD Primary care provider: [name], MD  Chief Complaint: Asthma (2 mth f/u - Okay), Gastroesophageal Reflux (2 mth f/u - SoSo), Allergic Rhinitis  (2 mth f/u - Okay), and Lichen Planus (2 mth f/u - Doing good - itching is coming back)  History of Present Illness: I had the pleasure of seeing Jeanne Evans for a [facility]  on 04/15/2023. She is a 37 y.o. female, who is being followed for severe persistent asthma,, GERD, allergic rhinitis, lichen planus. Her previous allergy office visit was on 01/21/23 with Dr. Marlynn Perking. Today is a regular follow up visit.  History obtained from patient, chart review.  Since last visit she was seen by ophthalmology and was diagnosed with panuveitis of both eyes.  Suspicious for possible sarcoid.  And azathioprine was increased to 100 mg daily.  Continue on Cosopt twice daily OU and PF 6 times a day OU  Today she reports  Asthma has been well-controlled on Trelegy 200 mcg 1 puff daily.  She has been missing doses due to difficulty paying for Trelegy.  She has not had a co-pay card given.  Needing her albuterol 1-2 times a month.  No side effects of Dupixent.  No antibiotics or systemic steroids since last visit.  Previous chest imaging did show possible sarcoid in the lungs however she saw pulmonary a year ago and they said it was not sarcoid.  Right now she does not want to go back to pulmonary due to a lot of stress and doctors appointments currently.  She is seeing rheumatology in July.  She feels like many of her symptoms are due to weight gain would like to focus on diet, exercise and weight loss first to see if that will improve her respiratory symptoms.  Lichen planus lesions are starting to return on her legs.  She feels like tacrolimus works the best and would like  refill for that  In regards to her eyes she reports intermittent compliance with azathioprine and eyedrops due to medication fatigue.  She does have some side effects of sedation with azathioprine but no she has to take it daily to prevent vision loss.  GERD is somewhat controlled on 40 mg twice daily.  Does report dietary discretions.  Rhinitis is well-controlled on Flonase, Astelin and Allegra daily.  Denies any breakthrough symptoms.    Pertinent History/Diagnostics:  - Asthma: severe persistent  -- Biologic Labs 05/2022: AEC 500, IgE 198 without perennial sensitizations  - Dupixent started 06/2022  -0 ED visits, 0 UC visits and 2 oral steroids in the past year - (3/23) 1 number of lifetime hospitalizations, 0 number of lifetime intubations.  -Obstructive/restrictive defect spirometry (06/04/2022): ratio 65, 1.21 L, 49% FEV1 (pre), + 21% FEV1 (post)  CTPA 3/23: IMPRESSION: 1. No pulmonary embolus. 2. 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 The right perihilar perifissural nodule is smaller than on prior exam. No new or progressive nodularity. 3. Unchanged hilar adenopathy, right greater than left. 4. Bronchial wall thickening most prominent in the lower lobes, can be seen with bronchitis or reactive airways disease - Allergic Rhinitis:   - 06/04/2022: Specific IgE positive to [name] grass and mountain cedar    Assessment and Plan: Jeanne Evans is a 37 y.o. female with: Severe persistent asthma without complication - Plan:  Spirometry with Graph  Other allergic rhinitis  Panuveitis of both eyes  Gastroesophageal reflux disease without esophagitis  Lichen planus  History of breast cancer  History of external beam radiation therapy  Sarcoidosis? Plan: Patient Instructions  Uveitis: concern for sarcoid  - Continue all prescription eye drops and azanthioprine as prescribed  - Follow up with ophthalmology as planned  -   Follow up with rheumatology as planned    Severe Asthma: persistent: well controlled, may have a component of pulmonary sarcoid  - Biologic Labs 05/2022: AEC 500, IgE 198 without perennial sensitizations  - Dupixent started 06/2022   PLAN:  - Spacer not needed with current regimen. - Daily controller medication(s):  Trelegy  - coupon given to help with co-pay  - Prior to physical activity: albuterol 2 puffs 10-15 minutes before physical activity. - Rescue medications: albuterol 4 puffs every 4-6 hours as needed  - Asthma control goals:  * Full participation in all desired activities (may need albuterol before activity) * Albuterol use two time or less a week on average (not counting use with activity) * Cough interfering with sleep two time or less a month * Oral steroids no more than once a year * No hospitalizations    GERD  - Continue omeprazole to 40mg  twice daily  - Continue dietary and lifestyle modifications   Allergic Rhinitis:  - Previous testing:  06/04/2022: Specific IgE positive to [name] grass and mountain cedar - Continue with: Flonase (fluticasone) two sprays per nostril daily and Astelin (azelastine) 2 sprays per nostril 1-2 times daily as needed, Allera 180mg  daily- You can use an extra dose of the antihistamine, if needed, for breakthrough symptoms.  - Consider nasal saline rinses 1-2 times daily to remove allergens from the nasal cavities as well as help with mucous clearance (this is especially helpful to do before the nasal sprays are given)   Lichen planus  -Continue to follow up with dermatology  -Continue Clobetasol 0.5% ointment twice a day as needed to control symptoms - Continue tacrolimus twice a day as needed to control symptoms  Follow up: 2 months   Thank you so much for letting me partake in your care today.  Don't hesitate to reach out if you have any additional concerns!  Ferol Luz, MD  [facility] [place] [place] No follow-ups on file.  Meds ordered this encounter  Medications   tacrolimus (PROTOPIC) 0.1 % ointment    Sig: Apply topically 2 (two) times daily.    Dispense:  100 g    Refill:  5    Lab Orders  No laboratory test(s) ordered today   Diagnostics: Spirometry:  Tracings reviewed. Her effort: Good reproducible efforts. FVC: 2.49L FEV1: 1.72L,69% predicted FEV1/FVC ratio: 69% Interpretation: Spirometry consistent with moderate obstructive disease.  Consistent from prior Please see scanned spirometry results for details.  Results interpreted by myself during this encounter and discussed with patient/family.   Medication List:  Current Outpatient Medications  Medication Sig Dispense Refill   Acetaminophen 325 MG CAPS Take by mouth.     albuterol (VENTOLIN HFA) 108 (90 Base) MCG/ACT inhaler INHALE 2 PUFFS INTO THE LUNGS EVERY 6 HOURS AS NEEDED FOR WHEEZING OR SHORTNESS OF BREATH 6.7 g 1   azaTHIOprine (IMURAN) 50 MG tablet Take 50 mg by mouth daily.     azelastine (ASTELIN) 0.1 % nasal spray Place 2 sprays into both nostrils 2 (two) times daily. Use in each nostril as directed 30 mL  6   clobetasol ointment (TEMOVATE) 0.05 % Apply 1 Application topically 2 (two) times daily. 30 g 0   dorzolamide-timolol (COSOPT) 2-0.5 % ophthalmic solution Place 1 drop into both eyes 2 (two) times daily.     DUPIXENT 300 MG/2ML prefilled syringe Inject 300 mg into the skin every 14 (fourteen) days.     fluticasone (FLONASE) 50 MCG/ACT nasal spray Place 2 sprays into both nostrils daily. 16 g 2   fluticasone furoate-vilanterol (BREO ELLIPTA) 100-25 MCG/ACT AEPB Inhale 1 puff into the lungs daily. 60 each 5   Fluticasone-Umeclidin-Vilant (TRELEGY ELLIPTA) 200-62.5-25 MCG/ACT AEPB Inhale 1 puff into the lungs daily. 1 each 6   ipratropium (ATROVENT) 0.06 % nasal spray 1 spray each nostril up to 3 times daily as needed for runny nose. 15 mL 5   levonorgestrel (MIRENA, 52 MG,) 20 MCG/DAY IUD by  Intrauterine route.     montelukast (SINGULAIR) 10 MG tablet Take 10 mg by mouth as needed.     naphazoline-pheniramine (NAPHCON-A) 0.025-0.3 % ophthalmic solution Place 1 drop into both eyes 4 (four) times daily as needed.     ofloxacin (OCUFLOX) 0.3 % ophthalmic solution Place 1 drop into both eyes 4 (four) times daily.     Olopatadine-Mometasone (RYALTRIS) [id number] MCG/ACT SUSP Place 1 spray into the nose daily. 29 g 5   omeprazole (PRILOSEC) 40 MG capsule Take 1 capsule (40 mg total) by mouth daily. First thing in the morning on an empty stomach 30 capsule 3   rizatriptan (MAXALT) 10 MG tablet Take 1 tablet (10 mg total) by mouth as needed for migraine. May repeat in 2 hours if needed 10 tablet 6   tacrolimus (PROTOPIC) 0.1 % ointment Apply topically 2 (two) times daily. 100 g 5   Current Facility-Administered Medications  Medication Dose Route Frequency Provider Last Rate Last Admin   dupilumab (DUPIXENT) prefilled syringe 300 mg  300 mg Subcutaneous Q14 Days Marcelyn Bruins, MD   300 mg at 04/07/23 4098   Allergies: Allergies  Allergen Reactions   Sulfa Antibiotics Hives   I reviewed her past medical history, social history, family history, and environmental history and no significant changes have been reported from her previous visit.  ROS: All others negative except as noted per HPI.   Objective: BP 122/70 (BP Location: Left Arm, Patient Position: Sitting, Cuff Size: Large)   Pulse 74   Temp 98.2 F (36.8 C) (Temporal)   Resp 16   Ht 5' 3.5" (1.613 m)   Wt 201 lb 6.4 oz (91.4 kg)   SpO2 98%   BMI 35.12 kg/m  Body mass index is 35.12 kg/m. General Appearance:  Alert, cooperative, no distress, appears stated age  Head:  Normocephalic, without obvious abnormality, atraumatic  Eyes:  Erythema of right eye , EOM's intact  Nose: Nares normal, normal mucosa, no visible anterior polyps, and septum midline  Throat: Lips, tongue normal; teeth and gums normal, normal  posterior oropharynx and no tonsillar exudate  Neck: Supple, symmetrical  Lungs:   clear to auscultation bilaterally, Respirations unlabored, no coughing  Heart:  regular rate and rhythm and no murmur, Appears well perfused  Extremities: No edema  Skin: Skin color, texture, turgor normal, no rashes on visible portions of skin  Neurologic: No gross deficits   Previous notes and tests were reviewed. The plan was reviewed with the patient/family, and all questions/concerned were addressed.  It was my pleasure to see Jeanne Evans today and participate in her care. Please feel free to  contact me with any questions or concerns.  Sincerely,  Ferol Luz, MD  Allergy & Immunology  [facility] Office: [phone]

## 2023-04-15 NOTE — Patient Instructions (Addendum)
Uveitis: concern for sarcoid  - Continue all prescription eye drops and azanthioprine as prescribed  - Follow up with ophthalmology as planned  -  Follow up with rheumatology as planned    Severe Asthma: persistent: well controlled, may have a component of pulmonary sarcoid  - Biologic Labs 05/2022: AEC 500, IgE 198 without perennial sensitizations  - Dupixent started 06/2022   PLAN:  - Spacer not needed with current regimen. - Daily controller medication(s):  Trelegy  - coupon given to help with co-pay  - Prior to physical activity: albuterol 2 puffs 10-15 minutes before physical activity. - Rescue medications: albuterol 4 puffs every 4-6 hours as needed  - Asthma control goals:  * Full participation in all desired activities (may need albuterol before activity) * Albuterol use two time or less a week on average (not counting use with activity) * Cough interfering with sleep two time or less a month * Oral steroids no more than once a year * No hospitalizations    GERD  - Continue omeprazole to 40mg  twice daily  - Continue dietary and lifestyle modifications   Allergic Rhinitis:  - Previous testing:  06/04/2022: Specific IgE positive to [name] grass and mountain cedar - Continue with: Flonase (fluticasone) two sprays per nostril daily and Astelin (azelastine) 2 sprays per nostril 1-2 times daily as needed, Allera 180mg  daily- You can use an extra dose of the antihistamine, if needed, for breakthrough symptoms.  - Consider nasal saline rinses 1-2 times daily to remove allergens from the nasal cavities as well as help with mucous clearance (this is especially helpful to do before the nasal sprays are given)   Lichen planus  -Continue to follow up with dermatology  -Continue Clobetasol 0.5% ointment twice a day as needed to control symptoms - Continue tacrolimus twice a day as needed to control symptoms  Follow up: 2 months   Thank you so much for letting me partake in  your care today.  Don't hesitate to reach out if you have any additional concerns!  Ferol Luz, MD  [facility] [place], [place]

## 2023-04-21 ENCOUNTER — Inpatient Hospital Stay: Payer: 59 | Attending: Hematology and Oncology | Admitting: Hematology and Oncology

## 2023-04-21 ENCOUNTER — Ambulatory Visit (INDEPENDENT_AMBULATORY_CARE_PROVIDER_SITE_OTHER): Payer: 59

## 2023-04-21 ENCOUNTER — Other Ambulatory Visit: Payer: Self-pay

## 2023-04-21 VITALS — BP 147/99 | HR 74 | Temp 97.9°F | Resp 17 | Wt 205.0 lb

## 2023-04-21 DIAGNOSIS — Z8042 Family history of malignant neoplasm of prostate: Secondary | ICD-10-CM | POA: Diagnosis not present

## 2023-04-21 DIAGNOSIS — Z8041 Family history of malignant neoplasm of ovary: Secondary | ICD-10-CM | POA: Insufficient documentation

## 2023-04-21 DIAGNOSIS — Z801 Family history of malignant neoplasm of trachea, bronchus and lung: Secondary | ICD-10-CM | POA: Diagnosis not present

## 2023-04-21 DIAGNOSIS — C50412 Malignant neoplasm of upper-outer quadrant of left female breast: Secondary | ICD-10-CM | POA: Diagnosis present

## 2023-04-21 DIAGNOSIS — Z171 Estrogen receptor negative status [ER-]: Secondary | ICD-10-CM | POA: Diagnosis not present

## 2023-04-21 DIAGNOSIS — J4550 Severe persistent asthma, uncomplicated: Secondary | ICD-10-CM

## 2023-04-21 NOTE — Assessment & Plan Note (Addendum)
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 There is no concern for disease recurrence. Most of her complaints today are related to asthma or sarcoidosis.  She is continuing to see the eye doctor and requiring some steroid drops for her eye issues.  No concerns on exam, breast exam also without any palpable masses or regional adenopathy.  I have recommended that she continue exercising as she is doing, eat healthy and return to clinic in 6 months or sooner as needed.  She has a mammogram scheduled for later this month.  She was encouraged to call us with any new questions or concerns.

## 2023-04-21 NOTE — Progress Notes (Signed)
Jeanne Graff, MD [address] [place] [address]   DIAGNOSIS:  Cancer Staging  Malignant neoplasm of upper-outer quadrant of left breast in female, estrogen receptor negative (HCC) Staging form: Breast, AJCC 8th Edition - Clinical stage from 06/04/2020: Stage IIB (cT2, cN0(f), cM0, G3, ER-, PR-, HER2-) - Unsigned Stage prefix: Initial diagnosis Method of lymph node assessment: Core biopsy Histologic grading system: 3 grade system   SUMMARY OF ONCOLOGIC HISTORY:  37 y.o. [place] woman status post left breast upper outer quadrant biopsy 05/29/2020 for a clinical T2 N0, stage IIB invasive ductal carcinoma, functionally triple negative, with an MIB-1 of 95%.   (1) genetics testing 06/23/2020 through the [facility] Multi-Cancer Panel found no deleterious mutations in AIP, ALK, APC, ATM, AXIN2,BAP1,  BARD1, BLM, BMPR1A, BRCA1, BRCA2, BRIP1, CASR, CDC73, CDH1, CDK4, CDKN1B, CDKN1C, CDKN2A (p14ARF), CDKN2A (p16INK4a), CEBPA, CHEK2, CTNNA1, DICER1, DIS3L2, EGFR (c.2369C>T, p.Thr790Met variant only), EPCAM (Deletion/duplication testing only), FH, FLCN, GATA2, GPC3, GREM1 (Promoter region deletion/duplication testing only), HOXB13 (c.251G>A, p.Gly84Glu), HRAS, KIT, MAX, MEN1, MET, MITF (c.952G>A, p.Glu318Lys variant only), MLH1, MSH2, MSH3, MSH6, MUTYH, NBN, NF1, NF2, NTHL1, PALB2, PDGFRA, PHOX2B, PMS2, POLD1, POLE, POT1, PRKAR1A, PTCH1, PTEN, RAD50, RAD51C, RAD51D, RB1, RECQL4, RET, RNF43, RUNX1, SDHAF2, SDHA (sequence changes only), SDHB, SDHC, SDHD, SMAD4, SMARCA4, SMARCB1, SMARCE1, STK11, SUFU, TERC, TERT, TMEM127, TP53, TSC1, TSC2, VHL, WRN and WT1.    (2) neoadjuvant chemotherapy consisting of cyclophosphamide and doxorubicin in dose dense fashion x4 started 06/19/2020, completed 08/05/2020, followed by paclitaxel and carboplatin weekly x12 starting 08/26/2020, completing 4 doses 09/23/2020             (a) echocardiogram on 06/13/2020 showed an EF of 60-65%             (b)  paclitaxel and carboplatin discontinued after 4 doses because of neuropathy and cytopenias   (3) status post left lumpectomy and sentinel lymph node sampling 11/19/2020 for a residual yp T1c ypN0 invasive ductal carcinoma involving the superior margin             (a) additional surgery 12/09/2020 cleared the margin in question             (b) repeat prognostic panel confirms triple negative disease             (c) status post left mastopexy and right breast reduction on 10/27/2021 with fat grafting bilaterally   (4) adjuvant radiation : 01/22/2021 through 03/10/2021 Site Technique Total Dose (Gy) Dose per Fx (Gy) Completed Fx Beam Energies  Breast, Left: Breast_Lt 3D 50.4/50.4 1.8 28/28 6X  Breast, Left: Breast_Lt_Bst 3D 10/10 2 5/5 6X              (a) received capecitabine sensitization, started 01/26/2021   (5) pembrolizumab/Keytruda began on 04/16/2021, held 05/11/2021 (after 2 doses) with persistent cough, later attributed to asthma (A) pembrolizumab resumed 08/06/2021 (B) PD-L1 combined score is 10   (6) molecular pathology from the 11/19/2020 sample 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  There were no mutations in BRCA1 or 2, ERBB2 or PIK3CA.  The microsatellite status was equivocal and the tumor mutational burden could not be determined on the sample.   (7) sarcoidosis? (followed by pulmonary: Ramaswamy)             (A) chest CT 06/13/2020 shows in addition to the left breast mass multiple ill-defined pulmonary nodules, pleural nodularity and symmetrical by hilar and mediastinal adenopathy consistent with sarcoidosis. (B) chest CT 05/29/2021 shows stable mediastinal and hilar  adenopathy as well as improved liver lesions.  No evidence of metastatic disease. (C) CT angiogram of the chest on February 02, 2022 shows no evidence of pulmonary embolus and improvement in possible sarcoidosis.  No progressive or new nodularity.  Bronchial wall thickening in the lower lobes which can be seen with bronchitis or  reactive airway disease.   CURRENT THERAPY: observation  INTERVAL HISTORY:  Jeanne Evans 37 y.o. female returns for follow up.   Since her last visit here, she has been doing well.  She is trying to lose weight.  She continues to have some eye issues and some asthma related complaints and wonders if this is from her sarcoidosis.  She denies any breast changes.  She has her mammogram scheduled for this June.  No change in bowel habits or urinary habits.  No new neurological complaints. 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   Lichen planus 01/21/2023   Gastroesophageal reflux disease without esophagitis 01/21/2023   Other allergic rhinitis 01/21/2023   Port-A-Cath in place 03/11/2022   Acute respiratory failure with hypoxemia (HCC) 02/03/2022   Acute respiratory failure with hypoxia (HCC) 02/03/2022   Asthma 07/31/2021   Lymphedema of left arm 02/02/2021   Drug-induced neutropenia (HCC) 09/30/2020   Genetic testing 06/26/2020   Family history of ovarian cancer    Family history of prostate cancer    Malignant neoplasm of upper-outer quadrant of left breast in female, estrogen receptor negative (HCC) 06/03/2020    is allergic to sulfa antibiotics.  MEDICAL HISTORY: Past Medical History:  Diagnosis Date   Asthma 07/31/2021   Breast cancer (HCC)    Family history of ovarian cancer    Family history of prostate cancer    GERD (gastroesophageal reflux disease)    History of asthma    has albuterol, uses PRN   History of heart murmur in childhood    History of multiple allergies     SURGICAL HISTORY: Past Surgical History:  Procedure Laterality Date   BREAST LUMPECTOMY WITH RADIOACTIVE SEED AND SENTINEL LYMPH NODE BIOPSY Left 11/19/2020   Procedure: LEFT BREAST LUMPECTOMY X 2 WITH RADIOACTIVE SEED AND SENTINEL LYMPH NODE MAPPING;  Surgeon: Harriette Bouillon, MD;   Location: [facility];  Service: General;  Laterality: Left;  PECTORAL BLOCK   BREAST REDUCTION SURGERY Right 10/27/2021   Procedure: MAMMARY REDUCTION  TO RIGHT BREAST;  Surgeon: Allena Napoleon, MD;  Location: [facility];  Service: Plastics;  Laterality: Right;   LIPOSUCTION WITH LIPOFILLING Bilateral 10/27/2021   Procedure: BILATERAL FAT GRAFTING TO BREAST;  Surgeon: Allena Napoleon, MD;  Location: [facility];  Service: Plastics;  Laterality: Bilateral;   PORTACATH PLACEMENT Right 06/18/2020   Procedure: INSERTION PORT-A-CATH WITH ULTRASOUND GUIDANCE;  Surgeon: Harriette Bouillon, MD;  Location: [facility];  Service: General;  Laterality: Right;   RE-EXCISION OF BREAST LUMPECTOMY Left 12/09/2020   Procedure: RE-EXCISION OF LEFT BREAST LUMPECTOMY;  Surgeon: Harriette Bouillon, MD;  Location:  SURGERY CENTER;  Service: General;  Laterality: Left;    SOCIAL HISTORY: Social History   Socioeconomic History   Marital status: Single    Spouse name: Not on file   Number of children: 2   Years of education: 12   Highest education level: Not on file  Occupational History   Not on file  Tobacco Use  Smoking status: Never    Passive exposure: Never   Smokeless tobacco: Never  Vaping Use   Vaping Use: Never used  Substance and Sexual Activity   Alcohol use: Yes    Comment: socially   Drug use: Yes    Types: Marijuana    Comment: occass   Sexual activity: Not on file  Other Topics Concern   Not on file  Social History Narrative   Right handed   Caffeine use: drinks about 1 cup coffee every morning (5 days per week)   Social Determinants of Health   Financial Resource Strain: High Risk (09/18/2021)   Overall Financial Resource Strain (CARDIA)    Difficulty of Paying Living Expenses: Very hard  Food Insecurity: Food Insecurity Present (09/18/2021)   Hunger Vital Sign    Worried About Running Out of Food in the Last Year:  Sometimes true    Ran Out of Food in the Last Year: Sometimes true  Transportation Needs: No Transportation Needs (09/18/2021)   PRAPARE - Administrator, Civil Service (Medical): No    Lack of Transportation (Non-Medical): No  Physical Activity: Not on file  Stress: Stress Concern Present (09/18/2021)   [employer] of Occupational Health - Occupational Stress Questionnaire    Feeling of Stress : Very much  Social Connections: Socially Isolated (09/18/2021)   Social Connection and Isolation Panel [NHANES]    Frequency of Communication with Friends and Family: More than three times a week    Frequency of Social Gatherings with Friends and Family: Once a week    Attends Religious Services: Never    Database administrator or Organizations: No    Attends Engineer, structural: Never    Marital Status: Never married  Catering manager Violence: Not on file    FAMILY HISTORY: Family History  Problem Relation Age of Onset   Asthma Mother    High blood pressure Mother    High blood pressure Father    High Cholesterol Father    Asthma Brother    Ovarian cancer Paternal Grandmother        dx 58s   Prostate cancer Paternal Grandfather        dx 51s   Lung cancer Maternal Uncle        dx late 21s   Prostate cancer Paternal Uncle        dx late 60s   Prostate cancer Paternal Uncle        dx early 49s    Review of Systems  Constitutional:  Positive for fatigue. Negative for appetite change, chills, fever and unexpected weight change.  HENT:   Negative for hearing loss, lump/mass, mouth sores, sore throat and trouble swallowing.   Eyes:  Negative for eye problems and icterus.  Respiratory:  Negative for chest tightness, cough and shortness of breath.   Cardiovascular:  Negative for chest pain, leg swelling and palpitations.  Gastrointestinal:  Negative for abdominal distention, abdominal pain, constipation, diarrhea, nausea and vomiting.  Endocrine: Negative for  hot flashes.  Genitourinary:  Negative for difficulty urinating.   Musculoskeletal:  Negative for arthralgias.  Skin:  Negative for itching and rash.  Neurological:  Negative for dizziness, extremity weakness, headaches and numbness.  Hematological:  Negative for adenopathy. Does not bruise/bleed easily.  Psychiatric/Behavioral:  Negative for depression. The patient is not nervous/anxious.       PHYSICAL EXAMINATION  ECOG PERFORMANCE STATUS: 1 - Symptomatic but completely ambulatory  Vitals:   04/21/23 1610  BP: (!) 147/99  Pulse: 74  Resp: 17  Temp: 97.9 F (36.6 C)  SpO2: 100%   Physical Exam Constitutional:      Appearance: Normal appearance.  HENT:     Head: Normocephalic and atraumatic.  Cardiovascular:     Rate and Rhythm: Normal rate and regular rhythm.     Pulses: Normal pulses.     Heart sounds: Normal heart sounds.  Pulmonary:     Effort: Pulmonary effort is normal.     Breath sounds: Normal breath sounds.  Chest:     Comments: Bilateral breast examined.  No palpable masses or regional adenopathy Musculoskeletal:        General: No swelling.     Cervical back: No rigidity.  Lymphadenopathy:     Cervical: No cervical adenopathy.  Skin:    General: Skin is warm and dry.  Neurological:     Mental Status: She is alert.     LABORATORY DATA:  CBC    Component Value Date/Time   WBC 4.8 06/04/2022 0928   WBC 4.8 05/20/2022 1252   RBC 4.55 06/04/2022 0928   RBC 4.28 05/20/2022 1252   HGB 12.6 06/04/2022 0928   HCT 40.1 06/04/2022 0928   PLT 290 06/04/2022 0928   MCV 88 06/04/2022 0928   MCH 27.7 06/04/2022 0928   MCH 28.7 05/20/2022 1252   MCHC 31.4 (L) 06/04/2022 0928   MCHC 33.7 05/20/2022 1252   RDW 14.5 06/04/2022 0928   LYMPHSABS 2.3 06/04/2022 0928   MONOABS 0.4 05/20/2022 1252   EOSABS 0.5 (H) 06/04/2022 0928   BASOSABS 0.0 06/04/2022 0928    CMP     Component Value Date/Time   NA 137 05/20/2022 1252   K 4.0 05/20/2022 1252   CL  104 05/20/2022 1252   CO2 28 05/20/2022 1252   GLUCOSE 92 05/20/2022 1252   BUN 18 05/20/2022 1252   CREATININE 0.99 05/20/2022 1252   CREATININE 1.03 (H) 10/14/2021 1356   CALCIUM 9.6 05/20/2022 1252   PROT 7.7 05/20/2022 1252   ALBUMIN 4.5 05/20/2022 1252   AST 18 05/20/2022 1252   AST 18 10/14/2021 1356   ALT 21 05/20/2022 1252   ALT 21 10/14/2021 1356   ALKPHOS 82 05/20/2022 1252   BILITOT 0.5 05/20/2022 1252   BILITOT 0.8 10/14/2021 1356   GFRNONAA >60 05/20/2022 1252   GFRNONAA >60 10/14/2021 1356   GFRAA >60 08/05/2020 0955   GFRAA >60 06/04/2020 0830    ASSESSMENT and THERAPY PLAN:   Malignant neoplasm of upper-outer quadrant of left breast in female, estrogen receptor negative (HCC) Ms. Lumpkins is a 37 year old woman with 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 There is no concern for disease recurrence. Most of her complaints today are related to asthma or sarcoidosis.  She is continuing to see the eye doctor and requiring some steroid drops for her eye issues.  No concerns on exam, breast exam also without any palpable masses or regional adenopathy.  I have recommended that she continue exercising as she is doing, eat healthy and return to clinic in 6 months or sooner as needed.  She has a mammogram scheduled for later this month.  She was encouraged to call us with any new questions or concerns.   Total time spent: 30 min  *Total Encounter Time as defined by the Centers for Medicare and Medicaid Services includes, in addition to the face-to-face time of a  patient visit (documented in the note above) non-face-to-face time: obtaining and reviewing outside history, ordering and reviewing medications, tests or procedures, care coordination (communications with other health care professionals or caregivers) and documentation in the medical  record.

## 2023-05-05 ENCOUNTER — Ambulatory Visit (INDEPENDENT_AMBULATORY_CARE_PROVIDER_SITE_OTHER): Payer: 59

## 2023-05-05 DIAGNOSIS — J4550 Severe persistent asthma, uncomplicated: Secondary | ICD-10-CM

## 2023-05-09 ENCOUNTER — Ambulatory Visit
Admission: RE | Admit: 2023-05-09 | Discharge: 2023-05-09 | Disposition: A | Discharge status: Home / Self Care | Payer: 59 | Source: Ambulatory Visit | Attending: Hematology and Oncology | Admitting: Hematology and Oncology

## 2023-05-09 DIAGNOSIS — C50412 Malignant neoplasm of upper-outer quadrant of left female breast: Secondary | ICD-10-CM

## 2023-05-09 DIAGNOSIS — Z171 Estrogen receptor negative status [ER-]: Secondary | ICD-10-CM

## 2023-05-11 ENCOUNTER — Telehealth: Payer: Self-pay | Admitting: Hematology and Oncology

## 2023-05-11 NOTE — Telephone Encounter (Signed)
Patient is aware of rescheduled appointment times/dates 

## 2023-05-18 ENCOUNTER — Other Ambulatory Visit: Payer: Self-pay | Admitting: *Deleted

## 2023-05-18 MED ORDER — DUPIXENT 300 MG/2ML ~~LOC~~ SOSY: 300.0000 mg | PREFILLED_SYRINGE | SUBCUTANEOUS | 11 refills | Status: DC

## 2023-05-23 ENCOUNTER — Ambulatory Visit (INDEPENDENT_AMBULATORY_CARE_PROVIDER_SITE_OTHER): Payer: 59

## 2023-05-23 DIAGNOSIS — J4550 Severe persistent asthma, uncomplicated: Secondary | ICD-10-CM

## 2023-05-24 ENCOUNTER — Ambulatory Visit: Payer: 59 | Admitting: Hematology and Oncology

## 2023-05-25 ENCOUNTER — Ambulatory Visit (INDEPENDENT_AMBULATORY_CARE_PROVIDER_SITE_OTHER): Payer: 59

## 2023-05-25 ENCOUNTER — Encounter: Payer: Self-pay | Admitting: Podiatry

## 2023-05-25 ENCOUNTER — Ambulatory Visit (INDEPENDENT_AMBULATORY_CARE_PROVIDER_SITE_OTHER): Payer: 59 | Admitting: Podiatry

## 2023-05-25 VITALS — BP 153/93 | HR 79

## 2023-05-25 DIAGNOSIS — M779 Enthesopathy, unspecified: Secondary | ICD-10-CM

## 2023-05-25 DIAGNOSIS — M778 Other enthesopathies, not elsewhere classified: Secondary | ICD-10-CM

## 2023-05-25 MED ORDER — BETAMETHASONE SOD PHOS & ACET 6 (3-3) MG/ML IJ SUSP
3.0000 mg | Freq: Once | INTRAMUSCULAR | Status: AC
Start: 2023-05-25 — End: 2023-05-25
  Administered 2023-05-25: 3 mg via INTRA_ARTICULAR

## 2023-05-25 NOTE — Progress Notes (Signed)
Chief Complaint  Patient presents with   Foot Pain    "My foot hurts on top of my foot." N - pain top of foot L - dorsal left D - 3 weeks O - suddenly C - tender, sore, throb A - walking, standing T - muscle rub, massage     HPI: 37 y.o. female presenting today as a new patient for evaluation of left foot pain ongoing for about 3 weeks.  Sudden onset.  Denies a history of injury.  She has not anything for treatment.  She says that her kids do massage her feet at the end of the day.  By the end of the day she is limping on her foot.  Past Medical History:  Diagnosis Date   Asthma 07/31/2021   Breast cancer (HCC)    Family history of ovarian cancer    Family history of prostate cancer    GERD (gastroesophageal reflux disease)    History of asthma    has albuterol, uses PRN   History of heart murmur in childhood    History of multiple allergies     Past Surgical History:  Procedure Laterality Date   BREAST LUMPECTOMY WITH RADIOACTIVE SEED AND SENTINEL LYMPH NODE BIOPSY Left 11/19/2020   Procedure: LEFT BREAST LUMPECTOMY X 2 WITH RADIOACTIVE SEED AND SENTINEL LYMPH NODE MAPPING;  Surgeon: Harriette Bouillon, MD;  Location: [facility];  Service: General;  Laterality: Left;  PECTORAL BLOCK   BREAST REDUCTION SURGERY Right 10/27/2021   Procedure: MAMMARY REDUCTION  TO RIGHT BREAST;  Surgeon: Allena Napoleon, MD;  Location: [facility];  Service: Plastics;  Laterality: Right;   LIPOSUCTION WITH LIPOFILLING Bilateral 10/27/2021   Procedure: BILATERAL FAT GRAFTING TO BREAST;  Surgeon: Allena Napoleon, MD;  Location: [facility];  Service: Plastics;  Laterality: Bilateral;   PORTACATH PLACEMENT Right 06/18/2020   Procedure: INSERTION PORT-A-CATH WITH ULTRASOUND GUIDANCE;  Surgeon: Harriette Bouillon, MD;  Location: [facility];  Service: General;  Laterality: Right;   RE-EXCISION OF BREAST LUMPECTOMY Left 12/09/2020   Procedure: RE-EXCISION  OF LEFT BREAST LUMPECTOMY;  Surgeon: Harriette Bouillon, MD;  Location: [facility];  Service: General;  Laterality: Left;    Allergies  Allergen Reactions   Sulfa Antibiotics Hives     Physical Exam: General: The patient is alert and oriented x3 in no acute distress.  Dermatology: Skin is warm, dry and supple bilateral lower extremities.   Vascular: Palpable pedal pulses bilaterally. Capillary refill within normal limits.  No appreciable edema.  No erythema.  Neurological: Grossly intact via light touch  Musculoskeletal Exam: Tenderness with palpation along the dorsal aspect of the left foot around the midtarsal joint.  Muscle strength 5/5 all compartments.  No structural deformity  Radiographic Exam LT foot 05/25/2023:  Normal osseous mineralization. Joint spaces preserved.  No fractures or osseous irregularities noted.  Impression: Negative  Assessment/Plan of Care: 1.  Capsulitis left foot  -Patient evaluated.  X-rays reviewed -Injection of 0.5 cc Celestone Soluspan injected along the dorsal aspect of the left midtarsal joint -Rx Meloxicam 15mg  daily  -Cam boot dispensed.  WBAT x 4 weeks -RICE -Return to clinic in 4 weeks for follow-up       Jeanne Evans, DPM [facility]  Dr. Felecia Evans, DPM    [address]  [place], [place] [address]                Office [phone]  Fax [phone]

## 2023-06-06 ENCOUNTER — Ambulatory Visit: Payer: 59 | Attending: Internal Medicine | Admitting: Internal Medicine

## 2023-06-06 ENCOUNTER — Ambulatory Visit (INDEPENDENT_AMBULATORY_CARE_PROVIDER_SITE_OTHER): Payer: 59

## 2023-06-06 ENCOUNTER — Encounter: Payer: Self-pay | Admitting: Internal Medicine

## 2023-06-06 VITALS — BP 127/85 | HR 70 | Resp 12 | Ht 63.0 in | Wt 203.0 lb

## 2023-06-06 DIAGNOSIS — L439 Lichen planus, unspecified: Secondary | ICD-10-CM

## 2023-06-06 DIAGNOSIS — J4520 Mild intermittent asthma, uncomplicated: Secondary | ICD-10-CM

## 2023-06-06 DIAGNOSIS — E559 Vitamin D deficiency, unspecified: Secondary | ICD-10-CM

## 2023-06-06 DIAGNOSIS — L818 Other specified disorders of pigmentation: Secondary | ICD-10-CM | POA: Insufficient documentation

## 2023-06-06 DIAGNOSIS — J4550 Severe persistent asthma, uncomplicated: Secondary | ICD-10-CM

## 2023-06-06 DIAGNOSIS — D702 Other drug-induced agranulocytosis: Secondary | ICD-10-CM | POA: Diagnosis not present

## 2023-06-06 DIAGNOSIS — H44113 Panuveitis, bilateral: Secondary | ICD-10-CM

## 2023-06-06 DIAGNOSIS — Z862 Personal history of diseases of the blood and blood-forming organs and certain disorders involving the immune mechanism: Secondary | ICD-10-CM | POA: Insufficient documentation

## 2023-06-06 DIAGNOSIS — R600 Localized edema: Secondary | ICD-10-CM

## 2023-06-06 NOTE — Progress Notes (Signed)
Office Visit Note  Patient: Jeanne Evans             Date of Birth: [birth year]           MRN: [id number]             PCP: [name], MD Referring: Ferol Luz, MD Visit Date: 06/06/2023   Subjective:  New Patient (Initial Visit) (Patient states she gets leg cramps. Patient states her fingers sometimes feel like they are stuck or jammed. Patient states her left ankle feels like it gets stuck and had happened for three weeks. Patient states she was told from the xray of her foot that nothing was seen. Patient states can only do blood pressure in right arm due to lymph nodes removed in left arm. )   History of Present Illness: Jeanne Evans is a 37 y.o. 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 She has a history of left sided breast cancer found 2021 and treated with regional lymph node resection and chemotherapy and immunotherapy including pembrolizumab. CTA chest imaging in 2023 demonstrated upper lobe nodules and hilar adenopathy, was evaluated with pulmonology and symptoms attributed to her asthma. Possible previous maybe burnt out disease changes. Previously on antihistamines and singulair but stopped due to sedation and has had good benefit with dupixent. She never had any specific treatment for sarcoidosis previously. Recent problems with pain and swelling involving feet and ankle with xray and podiatry examination unremarkable. Does have mild hyperpigmented skin rash on the legs attributed to lichen planus. Recently acute onset of eye pain used antibiotic drops in the right eye in February this helped but subsequently pain and blurry vision in left eye. She saw Dr. Sherryll Burger for this with findings of panuveitis and started treatment with azathioprine and steroid eye drops.  Activities of Daily Living:  Patient reports morning  stiffness for 0 minute.   Patient Reports nocturnal pain.  Difficulty dressing/grooming: Denies Difficulty climbing stairs: Denies Difficulty getting out of chair: Denies Difficulty using hands for taps, buttons, cutlery, and/or writing: Reports  Review of Systems  Constitutional:  Positive for fatigue.  HENT:  Positive for mouth dryness. Negative for mouth sores.   Eyes:  Negative for dryness.  Respiratory:  Negative for shortness of breath.   Cardiovascular:  Negative for chest pain and palpitations.  Gastrointestinal:  Negative for blood in stool, constipation and diarrhea.  Endocrine: Negative for increased urination.  Genitourinary:  Negative for involuntary urination.  Musculoskeletal:  Positive for joint pain and joint pain. Negative for gait problem, joint swelling, myalgias, muscle weakness, morning stiffness, muscle tenderness and myalgias.  Skin:  Negative for color change, rash, hair loss and sensitivity to sunlight.  Allergic/Immunologic: Negative for susceptible to infections.  Neurological:  Negative for dizziness and headaches.  Hematological:  Negative for swollen glands.  Psychiatric/Behavioral:  Negative for depressed mood and sleep disturbance. The patient is not nervous/anxious.     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   Lichen planus 01/21/2023   Gastroesophageal reflux disease without esophagitis 01/21/2023   Other allergic rhinitis 01/21/2023   Port-A-Cath in place 03/11/2022   Acute respiratory failure with hypoxemia (HCC) 02/03/2022   Acute respiratory failure  with hypoxia (HCC) 02/03/2022   Asthma 07/31/2021   Lymphedema of left arm 02/02/2021   Drug-induced neutropenia (HCC) 09/30/2020   Genetic testing 06/26/2020   Family history of ovarian cancer    Family history of  prostate cancer    Malignant neoplasm of upper-outer quadrant of left breast in female, estrogen receptor negative (HCC) 06/03/2020    Past Medical History:  Diagnosis Date   Asthma 07/31/2021   Breast cancer (HCC)    Family history of ovarian cancer    Family history of prostate cancer    GERD (gastroesophageal reflux disease)    History of asthma    has albuterol, uses PRN   History of heart murmur in childhood    History of multiple allergies     Family History  Problem Relation Age of Onset   Asthma Mother    High blood pressure Mother    High blood pressure Father    High Cholesterol Father    Asthma Brother    Ovarian cancer Paternal Grandmother        dx 96s   Prostate cancer Paternal Grandfather        dx 18s   Lung cancer Maternal Uncle        dx late 6s   Prostate cancer Paternal Uncle        dx late 39s   Prostate cancer Paternal Uncle        dx early 65s   Past Surgical History:  Procedure Laterality Date   BREAST LUMPECTOMY WITH RADIOACTIVE SEED AND SENTINEL LYMPH NODE BIOPSY Left 11/19/2020   Procedure: LEFT BREAST LUMPECTOMY X 2 WITH RADIOACTIVE SEED AND SENTINEL LYMPH NODE MAPPING;  Surgeon: Harriette Bouillon, MD;  Location: [facility];  Service: General;  Laterality: Left;  PECTORAL BLOCK   BREAST REDUCTION SURGERY Right 10/27/2021   Procedure: MAMMARY REDUCTION  TO RIGHT BREAST;  Surgeon: Allena Napoleon, MD;  Location: [facility];  Service: Plastics;  Laterality: Right;   LIPOSUCTION WITH LIPOFILLING Bilateral 10/27/2021   Procedure: BILATERAL FAT GRAFTING TO BREAST;  Surgeon: Allena Napoleon, MD;  Location: [facility];  Service: Plastics;  Laterality: Bilateral;   PORTACATH PLACEMENT Right 06/18/2020   Procedure: INSERTION PORT-A-CATH WITH ULTRASOUND GUIDANCE;  Surgeon: Harriette Bouillon, MD;  Location: [facility];  Service: General;  Laterality: Right;   RE-EXCISION OF BREAST LUMPECTOMY Left 12/09/2020    Procedure: RE-EXCISION OF LEFT BREAST LUMPECTOMY;  Surgeon: Harriette Bouillon, MD;  Location:  SUR[facility]ERY [facility];  Service: General;  Laterality: Left;   Social History   Social History Narrative   Right handed   Caffeine use: drinks about 1 cup coffee every morning (5 days per week)   Immunization History  Administered Date(s) Administered   Influenza,inj,Quad PF,6+ Mos 10/15/2021   PFIZER Comirnaty(Gray Top)Covid-19 Tri-Sucrose Vaccine 01/06/2021, 01/27/2021     Objective: Vital Signs: BP 127/85 (BP Location: Right Arm, Patient Position: Sitting, Cuff Size: Normal)   Pulse 70   Resp 12   Ht 5\' 3"  (1.6 m)   Wt 203 lb (92.1 kg)   BMI 35.96 kg/m    Physical Exam Constitutional:      Appearance: She is obese.  HENT:     Mouth/Throat:     Mouth: Mucous membranes are moist.     Pharynx: Oropharynx is clear.  Eyes:     Conjunctiva/sclera: Conjunctivae normal.  Cardiovascular:     Rate and Rhythm: Normal rate and regular rhythm.  Pulmonary:     Effort: Pulmonary effort is normal.     Breath sounds: Normal breath sounds.  Lymphadenopathy:     Cervical: No cervical adenopathy.  Skin:    General: Skin is warm and dry.     Comments: Scattered small hypopigmented spots on face and backs of hands Hyprepigmented skin rashes on fronts of knees and shins b/l Trace pitting edema foot and above ankle  Neurological:     Mental Status: She is alert.  Psychiatric:        Mood and Affect: Mood normal.      Musculoskeletal Exam:  Shoulders full ROM no tenderness or swelling Elbows full ROM no tenderness or swelling Wrists full ROM no tenderness or swelling Fingers full ROM no tenderness or swelling Knees full ROM no tenderness or swelling Left ankle in rigid boot   Investigation: No additional findings.  Imaging: DG Foot Complete Left  Result Date: 05/25/2023 Please see detailed radiograph report in office note.   Recent Labs: Lab Results  Component Value Date    WBC 4.8 06/04/2022   HGB 12.6 06/04/2022   PLT 290 06/04/2022   NA 137 05/20/2022   K 4.0 05/20/2022   CL 104 05/20/2022   CO2 28 05/20/2022   GLUCOSE 92 05/20/2022   BUN 18 05/20/2022   CREATININE 0.99 05/20/2022   BILITOT 0.5 05/20/2022   ALKPHOS 82 05/20/2022   AST 18 05/20/2022   ALT 21 05/20/2022   PROT 7.7 05/20/2022   ALBUMIN 4.5 05/20/2022   CALCIUM 9.6 05/20/2022   GFRAA >60 08/05/2020   QFTBGOLDPLUS NEGATIVE 07/07/2021    Speciality Comments: No specialty comments available.  Procedures:  No procedures performed Allergies: Sulfa antibiotics   Assessment / Plan:     Visit Diagnoses: Panuveitis of both eyes - Plan: Sedimentation rate, Angiotensin converting enzyme  Active disease in left eye by has exam with Dr. Sherryll Burger. Agree with the current treatment plan probably needs additional azathioprine dose titration if tolerable. Checking ESR and ACE for evidence of systemic disease activity. Karolee Ohs would be candidate for MMF or TNF inhibitors.  Drug-induced neutropenia (HCC)  WBC 4.0 very slightly low discussed can be a risk of azathioprine although TPMT was normal so not as high risk for suppression. Does titration with ophthalmology currently so no repeat today.  Lichen planus  Most likely 2/2 eczema from history, no papular rashes no EN.  Mild intermittent asthma without complication History of sarcoidosis  Hard to differentiate if there is disease activity assessing to reactive airway problems. No evidence of ILD on PFTs. If present, treatment should overlap with that for uveitis or other systemic involvement would expect a treatment response.  Vitamin D deficiency - Plan: Vitamin D 1,25 dihydroxy, VITAMIN D 25 Hydroxy (Vit-D Deficiency, Fractures)  Checking activated serum vitamin D assess sarcoidosis disease activity. Also checking baseline level.  Pedal edema - Plan: B Nat Peptide, Urinalysis, Routine w reflex microscopic  Ankle swellling looks more  consistent with edema than synovitis. Unclear mechanism for this unless there is just mild venous insufficiency. Checking BNP, urinalysis.  Idiopathic guttate hypomelanosis  Hypopigmented spots in sun exposed areas typical presentation, discussed UV protection.  Orders: Orders Placed This Encounter  Procedures   Sedimentation rate   Angiotensin converting enzyme   Vitamin D 1,25 dihydroxy   VITAMIN D 25 Hydroxy (Vit-D Deficiency, Fractures)   B Nat Peptide   Urinalysis, Routine w reflex microscopic   No orders of the defined types were placed in this  encounter.   Follow-Up Instructions: No follow-ups on file.   Fuller Plan, MD  Note - This record has been created using [employer].  Chart creation errors have been sought, but may not always  have been located. Such creation errors do not reflect on  the standard of medical care.

## 2023-06-06 NOTE — Patient Instructions (Signed)
I am checking for evidence of systemic inflammation related to sarcoidosis. If these are abnormal may need to further increase the medication dose or add on other treatment in the future.  Your leg swelling looks like mild pedal edema. I am checking for evidence of problem from the heart or kidney affecting this but it is most often a benign finding.  The light colored spots look like idiopathic guttate hypomelanosis which is a benign color change in sun exposed areas.

## 2023-06-07 LAB — URINALYSIS, ROUTINE W REFLEX MICROSCOPIC
Glucose, UA: NEGATIVE
Hgb urine dipstick: NEGATIVE
Specific Gravity, Urine: 1.011 (ref 1.001–1.035)
Squamous Epithelial / HPF: NONE SEEN /HPF (ref <=5)
pH: 7 (ref 5.0–8.0)

## 2023-06-07 LAB — BRAIN NATRIURETIC PEPTIDE: Brain Natriuretic Peptide: 28 pg/mL (ref <100)

## 2023-06-08 LAB — URINALYSIS, ROUTINE W REFLEX MICROSCOPIC
Bacteria, UA: NONE SEEN /HPF
Bilirubin Urine: NEGATIVE
Ketones, ur: NEGATIVE
Leukocytes,Ua: NEGATIVE
Nitrite: NEGATIVE
WBC, UA: NONE SEEN /HPF (ref 0–5)

## 2023-06-08 LAB — VITAMIN D 25 HYDROXY (VIT D DEFICIENCY, FRACTURES): Vit D, 25-Hydroxy: 19 ng/mL — ABNORMAL LOW (ref 30–100)

## 2023-06-09 LAB — VITAMIN D 1,25 DIHYDROXY
Vitamin D 1, 25 (OH)2 Total: 77 pg/mL — ABNORMAL HIGH (ref 18–72)
Vitamin D2 1, 25 (OH)2: <8 pg/mL
Vitamin D3 1, 25 (OH)2: 77 pg/mL

## 2023-06-09 LAB — URINALYSIS, ROUTINE W REFLEX MICROSCOPIC
Hyaline Cast: NONE SEEN /LPF
Protein, ur: NEGATIVE
RBC / HPF: NONE SEEN /HPF (ref 0–2)

## 2023-06-09 LAB — ANGIOTENSIN CONVERTING ENZYME: Angiotensin-Converting Enzyme: 53 U/L (ref 9–67)

## 2023-06-09 LAB — SEDIMENTATION RATE: Sed Rate: 31 mm/h — ABNORMAL HIGH (ref 0–20)

## 2023-06-20 ENCOUNTER — Ambulatory Visit (INDEPENDENT_AMBULATORY_CARE_PROVIDER_SITE_OTHER): Payer: 59

## 2023-06-20 DIAGNOSIS — J4550 Severe persistent asthma, uncomplicated: Secondary | ICD-10-CM | POA: Diagnosis not present

## 2023-06-22 ENCOUNTER — Ambulatory Visit (INDEPENDENT_AMBULATORY_CARE_PROVIDER_SITE_OTHER): Payer: 59 | Admitting: Podiatry

## 2023-06-22 ENCOUNTER — Encounter: Payer: Self-pay | Admitting: Podiatry

## 2023-06-22 DIAGNOSIS — M778 Other enthesopathies, not elsewhere classified: Secondary | ICD-10-CM | POA: Diagnosis not present

## 2023-06-22 MED ORDER — MELOXICAM 15 MG PO TABS: 15.0000 mg | ORAL_TABLET | Freq: Every day | ORAL | 1 refills | Status: AC

## 2023-06-22 MED ORDER — BETAMETHASONE SOD PHOS & ACET 6 (3-3) MG/ML IJ SUSP
3.0000 mg | Freq: Once | INTRAMUSCULAR | Status: AC
Start: 2023-06-22 — End: 2023-06-22
  Administered 2023-06-22: 3 mg via INTRA_ARTICULAR

## 2023-06-22 NOTE — Progress Notes (Signed)
No chief complaint on file.   HPI: 37 y.o. female presenting today for follow-up evaluation of capsulitis to the left dorsal midfoot.  Patient states that overall she feels significantly better.  She does have some slight tenderness to the dorsum of the foot but great improvement.  She has begin transitioning out of the cam boot into regular sneakers for the last few days  Past Medical History:  Diagnosis Date   Asthma 07/31/2021   Breast cancer [facility])    Family history of ovarian cancer    Family history of prostate cancer    GERD (gastroesophageal reflux disease)    History of asthma    has albuterol, uses PRN   History of heart murmur in childhood    History of multiple allergies     Past Surgical History:  Procedure Laterality Date   BREAST LUMPECTOMY WITH RADIOACTIVE SEED AND SENTINEL LYMPH NODE BIOPSY Left 11/19/2020   Procedure: LEFT BREAST LUMPECTOMY X 2 WITH RADIOACTIVE SEED AND SENTINEL LYMPH NODE MAPPING;  Surgeon: Harriette Bouillon, MD;  Location: [facility];  Service: General;  Laterality: Left;  PECTORAL BLOCK   BREAST REDUCTION SURGERY Right 10/27/2021   Procedure: MAMMARY REDUCTION  TO RIGHT BREAST;  Surgeon: Allena Napoleon, MD;  Location: [facility];  Service: Plastics;  Laterality: Right;   LIPOSUCTION WITH LIPOFILLING Bilateral 10/27/2021   Procedure: BILATERAL FAT GRAFTING TO BREAST;  Surgeon: Allena Napoleon, MD;  Location: [facility];  Service: Plastics;  Laterality: Bilateral;   PORTACATH PLACEMENT Right 06/18/2020   Procedure: INSERTION PORT-A-CATH WITH ULTRASOUND GUIDANCE;  Surgeon: Harriette Bouillon, MD;  Location: [facility];  Service: General;  Laterality: Right;   RE-EXCISION OF BREAST LUMPECTOMY Left 12/09/2020   Procedure: RE-EXCISION OF LEFT BREAST LUMPECTOMY;  Surgeon: Harriette Bouillon, MD;  Location: [facility];  Service: General;  Laterality: Left;    Allergies  Allergen Reactions    Sulfa Antibiotics Hives     Physical Exam: General: The patient is alert and oriented x3 in no acute distress.  Dermatology: Skin is warm, dry and supple bilateral lower extremities.   Vascular: Palpable pedal pulses bilaterally. Capillary refill within normal limits.  No appreciable edema.  No erythema.  Neurological: Grossly intact via light touch  Musculoskeletal Exam: There continues to be some slight tenderness with palpation along the dorsal aspect of the left foot around the midtarsal joint.  Muscle strength 5/5 all compartments.  No structural deformity  Radiographic Exam LT foot 05/25/2023:  Normal osseous mineralization. Joint spaces preserved.  No fractures or osseous irregularities noted.  Impression: Negative  Assessment/Plan of Care: 1.  Capsulitis left dorsal midfoot  -Patient evaluated.  Overall improvement but she continues to have some slight tenderness -Injection of 0.5 cc Celestone Soluspan injected along the dorsal aspect of the left midtarsal joint -New Rx Meloxicam 15mg  daily.  Patient states that she never picked up the prescription last visit -Patient may now transition out of the cam boot into good supportive tennis shoes and sneakers -Return to clinic as needed       Felecia Shelling, DPM [facility]  Dr. Felecia Shelling, DPM    [address], [place] [address]  Office [phone]  Fax [phone]

## 2023-07-01 ENCOUNTER — Ambulatory Visit (INDEPENDENT_AMBULATORY_CARE_PROVIDER_SITE_OTHER): Payer: 59 | Admitting: Internal Medicine

## 2023-07-01 ENCOUNTER — Encounter: Payer: Self-pay | Admitting: Internal Medicine

## 2023-07-01 VITALS — BP 128/70 | HR 91 | Temp 98.2°F | Resp 16

## 2023-07-01 DIAGNOSIS — J3089 Other allergic rhinitis: Secondary | ICD-10-CM | POA: Diagnosis not present

## 2023-07-01 DIAGNOSIS — J4550 Severe persistent asthma, uncomplicated: Secondary | ICD-10-CM | POA: Diagnosis not present

## 2023-07-01 DIAGNOSIS — K219 Gastro-esophageal reflux disease without esophagitis: Secondary | ICD-10-CM | POA: Diagnosis not present

## 2023-07-01 DIAGNOSIS — L439 Lichen planus, unspecified: Secondary | ICD-10-CM

## 2023-07-01 DIAGNOSIS — H44113 Panuveitis, bilateral: Secondary | ICD-10-CM | POA: Diagnosis not present

## 2023-07-01 DIAGNOSIS — Z862 Personal history of diseases of the blood and blood-forming organs and certain disorders involving the immune mechanism: Secondary | ICD-10-CM

## 2023-07-01 DIAGNOSIS — Z853 Personal history of malignant neoplasm of breast: Secondary | ICD-10-CM

## 2023-07-01 NOTE — Patient Instructions (Addendum)
Uveitis: concern for sarcoid  - Continue all prescription eye drops and azanthioprine as prescribed  - Follow up with ophthalmology as planned  -  Follow up with rheumatology as planned    Severe Asthma: persistent: well controlled, may have a component of pulmonary sarcoid  - Biologic Labs 05/2022: AEC 500, IgE 198 without perennial sensitizations  - Dupixent started 06/2022  Breathing tests: looked the best they have ever been!   PLAN:  - Spacer not needed with current regimen. - Daily controller medication(s):  Trelegy  - coupon given to help with co-pay  - Prior to physical activity: albuterol 2 puffs 10-15 minutes before physical activity. - Rescue medications: albuterol 4 puffs every 4-6 hours as needed  - Asthma control goals:  * Full participation in all desired activities (may need albuterol before activity) * Albuterol use two time or less a week on average (not counting use with activity) * Cough interfering with sleep two time or less a month * Oral steroids no more than once a year * No hospitalizations    GERD  - Continue omeprazole to 40mg  twice daily  - Continue dietary and lifestyle modifications   Allergic Rhinitis:  - Previous testing:  06/04/2022: Specific IgE positive to [name] grass and mountain cedar - Continue with: Flonase (fluticasone) two sprays per nostril daily and Astelin (azelastine) 2 sprays per nostril 1-2 times daily as needed, Allera 180mg  daily- You can use an extra dose of the antihistamine, if needed, for breakthrough symptoms.  - Consider nasal saline rinses 1-2 times daily to remove allergens from the nasal cavities as well as help with mucous clearance (this is especially helpful to do before the nasal sprays are given)   Lichen planus  -Continue to follow up with dermatology  -Continue Clobetasol 0.5% ointment twice a day as needed to control symptoms - Continue tacrolimus or pimecrolimus twice a day as needed to control  symptoms  Follow up: 6 months   Thank you so much for letting me partake in your care today.  Don't hesitate to reach out if you have any additional concerns!  Ferol Luz, MD  [facility] [place], [place]

## 2023-07-01 NOTE — Progress Notes (Signed)
Follow Up Note  RE: Jeanne Evans MRN: [id number] DOB: [birth year] Date of Office Visit: 07/01/2023  Referring provider: [name], MD Primary care provider: [name], MD  Chief Complaint: Asthma (Doing Good)  History of Present Illness: I had the pleasure of seeing Jeanne Evans for a follow up visit at the [facility] on 07/08/2023. She is a 37 y.o. female, who is being followed for severe persistent asthma,, GERD, allergic rhinitis, lichen planus. Her previous allergy office visit was on 04/15/23 with Dr. Marlynn Perking. Today is a regular follow up visit.  History obtained from patient, chart review.  At last visit FEV1: 1.72L,69% predicted  Today she reports  Asthma has been well-controlled on Trelegy 200 mcg 1 puff daily and dupixent 300mg  every 2 weeks.  Rare albuterol use.    She continues had struggle with lichen planus.  Tacrolimus worked best, but pharmacy didn't have this medication.    In regards to her eyes she has been compliant with azanthioprine. Continues to follow up with optho  and rheumatology, Overall consensus is probably sarcoid, but most of pulmonary symptoms are due to asthma.    GERD is scontrolled on 40 mg twice daily.  Does report dietary discretions.  Rhinitis is well-controlled on Flonase, Astelin and Allegra daily.  Denies any breakthrough symptoms.    Pertinent History/Diagnostics:  - Asthma: severe persistent  -- Biologic Labs 05/2022: AEC 500, IgE 198 without perennial sensitizations  - Dupixent started 06/2022  -0 ED visits, 0 UC visits and 2 oral steroids in the past year - (3/23) 1 number of lifetime hospitalizations, 0 number of lifetime intubations.  -Obstructive/restrictive defect spirometry (06/04/2022): ratio 65, 1.21 L, 49% FEV1 (pre), + 21% FEV1 (post)  CTPA 3/23: IMPRESSION: 1. No pulmonary embolus. 2. 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 The  right perihilar perifissural nodule is smaller than on prior exam. No new or progressive nodularity. 3. Unchanged hilar adenopathy, right greater than left. 4. Bronchial wall thickening most prominent in the lower lobes, can be seen with bronchitis or reactive airways disease - Allergic Rhinitis:   - 06/04/2022: Specific IgE positive to [name] grass and mountain cedar    Assessment and Plan: Jeanne Evans is a 37 y.o. female with: Severe persistent asthma without complication - Plan: Spirometry with Graph  Other allergic rhinitis  Panuveitis of both eyes  Gastroesophageal reflux disease without esophagitis  Lichen planus  History of breast cancer  History of sarcoidosis   Plan: Patient Instructions  Uveitis: concern for sarcoid  - Continue all prescription eye drops and azanthioprine as prescribed  - Follow up with ophthalmology as planned  -  Follow up with rheumatology as planned    Severe Asthma: persistent: well controlled, may have a component of pulmonary sarcoid  - Biologic Labs 05/2022: AEC 500, IgE 198 without perennial sensitizations  - Dupixent started 06/2022  Breathing tests: looked the best they have ever been!   PLAN:  - Spacer not needed with current regimen. - Daily controller medication(s):  Trelegy  - coupon given to help with co-pay  - Prior to physical activity: albuterol 2 puffs 10-15 minutes before physical activity. - Rescue medications: albuterol 4 puffs every 4-6 hours as needed  - Asthma control goals:  * Full participation in all desired activities (may need albuterol before activity) * Albuterol use two time or less a week on average (not counting use with activity) * Cough interfering with  sleep two time or less a month * Oral steroids no more than once a year * No hospitalizations    GERD  - Continue omeprazole to 40mg  twice daily  - Continue dietary and lifestyle modifications   Allergic Rhinitis:  - Previous testing:   06/04/2022: Specific IgE positive to [name] grass and mountain cedar - Continue with: Flonase (fluticasone) two sprays per nostril daily and Astelin (azelastine) 2 sprays per nostril 1-2 times daily as needed, Allera 180mg  daily- You can use an extra dose of the antihistamine, if needed, for breakthrough symptoms.  - Consider nasal saline rinses 1-2 times daily to remove allergens from the nasal cavities as well as help with mucous clearance (this is especially helpful to do before the nasal sprays are given)   Lichen planus  -Continue to follow up with dermatology  -Continue Clobetasol 0.5% ointment twice a day as needed to control symptoms - Continue tacrolimus or pimecrolimus twice a day as needed to control symptoms  Follow up: 6 months   Thank you so much for letting me partake in your care today.  Don't hesitate to reach out if you have any additional concerns!  Ferol Luz, MD  [facility] [place] [place] No follow-ups on file.  Meds ordered this encounter  Medications   pimecrolimus (ELIDEL) 1 % cream    Sig: Apply topically 2 (two) times daily.    Dispense:  100 g    Refill:  5    Lab Orders  No laboratory test(s) ordered today   Diagnostics: Spirometry:  Tracings reviewed. Her effort: Good reproducible efforts. FVC: 3.20L FEV1: 2.13L,86% predicted FEV1/FVC ratio: 67% Interpretation: Spirometry consistent with mild obstructive disease.  Improved from prior Please see scanned spirometry results for details.  Results interpreted by myself during this encounter and discussed with patient/family.   Medication List:  Current Outpatient Medications  Medication Sig Dispense Refill   Acetaminophen 325 MG CAPS Take by mouth.     albuterol (VENTOLIN HFA) 108 (90 Base) MCG/ACT inhaler INHALE 2 PUFFS INTO THE LUNGS EVERY 6 HOURS AS NEEDED FOR WHEEZING OR SHORTNESS OF BREATH 6.7 g 1   azaTHIOprine (IMURAN) 50 MG tablet Take 50 mg by mouth daily.      azelastine (ASTELIN) 0.1 % nasal spray Place 2 sprays into both nostrils 2 (two) times daily. Use in each nostril as directed 30 mL 6   clobetasol ointment (TEMOVATE) 0.05 % Apply 1 Application topically 2 (two) times daily. 30 g 0   dorzolamide-timolol (COSOPT) 2-0.5 % ophthalmic solution Place 1 drop into both eyes 2 (two) times daily.     DUPIXENT 300 MG/2ML prefilled syringe Inject 300 mg into the skin every 14 (fourteen) days. 3.92 mL 11   fluticasone (FLONASE) 50 MCG/ACT nasal spray Place 2 sprays into both nostrils daily. 16 g 2   fluticasone furoate-vilanterol (BREO ELLIPTA) 100-25 MCG/ACT AEPB Inhale 1 puff into the lungs daily. 60 each 5   Fluticasone-Umeclidin-Vilant (TRELEGY ELLIPTA) 200-62.5-25 MCG/ACT AEPB Inhale 1 puff into the lungs daily. 1 each 6   ipratropium (ATROVENT) 0.06 % nasal spray 1 spray each nostril up to 3 times daily as needed for runny nose. 15 mL 5   levonorgestrel (MIRENA, 52 MG,) 20 MCG/DAY IUD by Intrauterine route.     meloxicam (MOBIC) 15 MG tablet Take 1 tablet (15 mg total) by mouth daily. 60 tablet 1   montelukast (SINGULAIR) 10 MG tablet Take 10 mg by mouth as needed.  naphazoline-pheniramine (NAPHCON-A) 0.025-0.3 % ophthalmic solution Place 1 drop into both eyes 4 (four) times daily as needed.     ofloxacin (OCUFLOX) 0.3 % ophthalmic solution Place 1 drop into both eyes 4 (four) times daily.     Olopatadine-Mometasone (RYALTRIS) [id number] MCG/ACT SUSP Place 1 spray into the nose daily. 29 g 5   omeprazole (PRILOSEC) 40 MG capsule Take 1 capsule (40 mg total) by mouth daily. First thing in the morning on an empty stomach 30 capsule 3   pimecrolimus (ELIDEL) 1 % cream Apply topically 2 (two) times daily. 100 g 5   prednisoLONE acetate (PRED FORTE) 1 % ophthalmic suspension Apply to eye.     rizatriptan (MAXALT) 10 MG tablet Take 1 tablet (10 mg total) by mouth as needed for migraine. May repeat in 2 hours if needed 10 tablet 6   tacrolimus (PROTOPIC) 0.1 %  ointment Apply topically 2 (two) times daily. 100 g 5   Current Facility-Administered Medications  Medication Dose Route Frequency Provider Last Rate Last Admin   dupilumab (DUPIXENT) prefilled syringe 300 mg  300 mg Subcutaneous Q14 Days Marcelyn Bruins, MD   300 mg at 07/04/23 1610   Allergies: Allergies  Allergen Reactions   Sulfa Antibiotics Hives   I reviewed her past medical history, social history, family history, and environmental history and no significant changes have been reported from her previous visit.  ROS: All others negative except as noted per HPI.   Objective: BP 128/70 (BP Location: Right Arm, Patient Position: Sitting, Cuff Size: Large)   Pulse 91   Temp 98.2 F (36.8 C) (Temporal)   Resp 16   SpO2 99%  There is no height or weight on file to calculate BMI. General Appearance:  Alert, cooperative, no distress, appears stated age  Head:  Normocephalic, without obvious abnormality, atraumatic  Eyes:  Erythema of right eye , EOM's intact  Nose: Nares normal, normal mucosa, no visible anterior polyps, and septum midline  Throat: Lips, tongue normal; teeth and gums normal, normal posterior oropharynx and no tonsillar exudate  Neck: Supple, symmetrical  Lungs:   clear to auscultation bilaterally, Respirations unlabored, no coughing  Heart:  regular rate and rhythm and no murmur, Appears well perfused  Extremities: No edema  Skin: Skin color, texture, turgor normal, no rashes on visible portions of skin  Neurologic: No gross deficits   Previous notes and tests were reviewed. The plan was reviewed with the patient/family, and all questions/concerned were addressed.  It was my pleasure to see Jeanne Evans today and participate in her care. Please feel free to contact me with any questions or concerns.  Sincerely,  Ferol Luz, MD  Allergy & Immunology  [facility] Office: [phone]

## 2023-07-04 ENCOUNTER — Ambulatory Visit (INDEPENDENT_AMBULATORY_CARE_PROVIDER_SITE_OTHER): Payer: 59

## 2023-07-04 DIAGNOSIS — J4550 Severe persistent asthma, uncomplicated: Secondary | ICD-10-CM | POA: Diagnosis not present

## 2023-07-06 ENCOUNTER — Other Ambulatory Visit: Payer: Self-pay

## 2023-07-06 ENCOUNTER — Inpatient Hospital Stay: Payer: 59 | Attending: Hematology and Oncology | Admitting: Hematology and Oncology

## 2023-07-06 VITALS — BP 123/82 | HR 91 | Temp 98.1°F | Resp 18 | Ht 63.0 in | Wt 199.1 lb

## 2023-07-06 DIAGNOSIS — Z9221 Personal history of antineoplastic chemotherapy: Secondary | ICD-10-CM | POA: Insufficient documentation

## 2023-07-06 DIAGNOSIS — R252 Cramp and spasm: Secondary | ICD-10-CM | POA: Insufficient documentation

## 2023-07-06 DIAGNOSIS — Z6835 Body mass index (BMI) 35.0-35.9, adult: Secondary | ICD-10-CM

## 2023-07-06 DIAGNOSIS — Z171 Estrogen receptor negative status [ER-]: Secondary | ICD-10-CM

## 2023-07-06 DIAGNOSIS — Z853 Personal history of malignant neoplasm of breast: Secondary | ICD-10-CM | POA: Insufficient documentation

## 2023-07-06 DIAGNOSIS — Z801 Family history of malignant neoplasm of trachea, bronchus and lung: Secondary | ICD-10-CM | POA: Diagnosis not present

## 2023-07-06 DIAGNOSIS — Z8041 Family history of malignant neoplasm of ovary: Secondary | ICD-10-CM | POA: Diagnosis not present

## 2023-07-06 DIAGNOSIS — Z8042 Family history of malignant neoplasm of prostate: Secondary | ICD-10-CM | POA: Diagnosis not present

## 2023-07-06 DIAGNOSIS — H44113 Panuveitis, bilateral: Secondary | ICD-10-CM | POA: Diagnosis not present

## 2023-07-06 DIAGNOSIS — C50412 Malignant neoplasm of upper-outer quadrant of left female breast: Secondary | ICD-10-CM

## 2023-07-06 NOTE — Progress Notes (Signed)
[name], MD [address] [place] [address]   DIAGNOSIS:  Cancer Staging  Malignant neoplasm of upper-outer quadrant of left breast in female, estrogen receptor negative (HCC) Staging form: Breast, AJCC 8th Edition - Clinical stage from 06/04/2020: Stage IIB (cT2, cN0(f), cM0, G3, ER-, PR-, HER2-) - Unsigned Stage prefix: Initial diagnosis Method of lymph node assessment: Core biopsy Histologic grading system: 3 grade system   SUMMARY OF ONCOLOGIC HISTORY:  37 y.o. [place] woman status post left breast upper outer quadrant biopsy 05/29/2020 for a clinical T2 N0, stage IIB invasive ductal carcinoma, functionally triple negative, with an MIB-1 of 95%.   (1) genetics testing 06/23/2020 through the [facility]-Cancer Panel found no deleterious mutations in AIP, ALK, APC, ATM, AXIN2,BAP1,  BARD1, BLM, BMPR1A, BRCA1, BRCA2, BRIP1, CASR, CDC73, CDH1, CDK4, CDKN1B, CDKN1C, CDKN2A (p14ARF), CDKN2A (p16INK4a), CEBPA, CHEK2, CTNNA1, DICER1, DIS3L2, EGFR (c.2369C>T, p.Thr790Met variant only), EPCAM (Deletion/duplication testing only), FH, FLCN, GATA2, GPC3, GREM1 (Promoter region deletion/duplication testing only), HOXB13 (c.251G>A, p.Gly84Glu), HRAS, KIT, MAX, MEN1, MET, MITF (c.952G>A, p.Glu318Lys variant only), MLH1, MSH2, MSH3, MSH6, MUTYH, NBN, NF1, NF2, NTHL1, PALB2, PDGFRA, PHOX2B, PMS2, POLD1, POLE, POT1, PRKAR1A, PTCH1, PTEN, RAD50, RAD51C, RAD51D, RB1, RECQL4, RET, RNF43, RUNX1, SDHAF2, SDHA (sequence changes only), SDHB, SDHC, SDHD, SMAD4, SMARCA4, SMARCB1, SMARCE1, STK11, SUFU, TERC, TERT, TMEM127, TP53, TSC1, TSC2, VHL, WRN and WT1.    (2) neoadjuvant chemotherapy consisting of cyclophosphamide and doxorubicin in dose dense fashion x4 started 06/19/2020, completed 08/05/2020, followed by paclitaxel and carboplatin weekly x12 starting 08/26/2020, completing 4 doses 09/23/2020             (a) echocardiogram on 06/13/2020 showed an EF of 60-65%             (b)  paclitaxel and carboplatin discontinued after 4 doses because of neuropathy and cytopenias   (3) status post left lumpectomy and sentinel lymph node sampling 11/19/2020 for a residual yp T1c ypN0 invasive ductal carcinoma involving the superior margin             (a) additional surgery 12/09/2020 cleared the margin in question             (b) repeat prognostic panel confirms triple negative disease             (c) status post left mastopexy and right breast reduction on 10/27/2021 with fat grafting bilaterally   (4) adjuvant radiation : 01/22/2021 through 03/10/2021 Site Technique Total Dose (Gy) Dose per Fx (Gy) Completed Fx Beam Energies  Breast, Left: Breast_Lt 3D 50.4/50.4 1.8 28/28 6X  Breast, Left: Breast_Lt_Bst 3D 10/10 2 5/5 6X              (a) received capecitabine sensitization, started 01/26/2021   (5) pembrolizumab/Keytruda began on 04/16/2021, held 05/11/2021 (after 2 doses) with persistent cough, later attributed to asthma (A) pembrolizumab resumed 08/06/2021 (B) PD-L1 combined score is 10   (6) molecular pathology from the 11/19/2020 sample 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  There were no mutations in BRCA1 or 2, ERBB2 or PIK3CA.  The microsatellite status was equivocal and the tumor mutational burden could not be determined on the sample.   (7) sarcoidosis? (followed by pulmonary: Ramaswamy)             (A) chest CT 06/13/2020 shows in addition to the left breast mass multiple ill-defined pulmonary nodules, pleural nodularity and symmetrical by hilar and mediastinal adenopathy consistent with sarcoidosis. (B) chest CT 05/29/2021 shows stable mediastinal and hilar  adenopathy as well as improved liver lesions.  No evidence of metastatic disease. (C) CT angiogram of the chest on February 02, 2022 shows no evidence of pulmonary embolus and improvement in possible sarcoidosis.  No progressive or new nodularity.  Bronchial wall thickening in the lower lobes which can be seen with bronchitis or  reactive airway disease.  CURRENT THERAPY: observation  INTERVAL HISTORY:  [name] 37 y.o. female returns for follow up.   Since her last visit here, she has seen Dr Dimple Casey from rheumatology.  She is being treated for pan uveitis. She also noticed RLE cramps as well bilaterally. She is struggling with weight gain.She is interested in weight management. She has been having no issues with breathing. 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   Lichen planus 01/21/2023   Gastroesophageal reflux disease without esophagitis 01/21/2023   Other allergic rhinitis 01/21/2023   Port-A-Cath in place 03/11/2022   Acute respiratory failure with hypoxemia (HCC) 02/03/2022   Acute respiratory failure with hypoxia (HCC) 02/03/2022   Asthma 07/31/2021   Lymphedema of left arm 02/02/2021   Drug-induced neutropenia (HCC) 09/30/2020   Genetic testing 06/26/2020   Family history of ovarian cancer    Family history of prostate cancer    Malignant neoplasm of upper-outer quadrant of left breast in female, estrogen receptor negative (HCC) 06/03/2020    is allergic to sulfa antibiotics.  MEDICAL HISTORY: Past Medical History:  Diagnosis Date   Asthma 07/31/2021   Breast cancer (HCC)    Family history of ovarian cancer    Family history of prostate cancer    GERD (gastroesophageal reflux disease)    History of asthma    has albuterol, uses PRN   History of heart murmur in childhood    History of multiple allergies     SURGICAL HISTORY: Past Surgical History:  Procedure Laterality Date   BREAST LUMPECTOMY WITH RADIOACTIVE SEED AND SENTINEL LYMPH NODE BIOPSY Left 11/19/2020   Procedure: LEFT BREAST  LUMPECTOMY X 2 WITH RADIOACTIVE SEED AND SENTINEL LYMPH NODE MAPPING;  Surgeon: Harriette Bouillon, MD;  Location: [facility];  Service: General;  Laterality: Left;  PECTORAL BLOCK   BREAST REDUCTION SURGERY Right 10/27/2021   Procedure: MAMMARY REDUCTION  TO RIGHT BREAST;  Surgeon: Allena Napoleon, MD;  Location: [facility];  Service: Plastics;  Laterality: Right;   LIPOSUCTION WITH LIPOFILLING Bilateral 10/27/2021   Procedure: BILATERAL FAT GRAFTING TO BREAST;  Surgeon: Allena Napoleon, MD;  Location: [facility];  Service: Plastics;  Laterality: Bilateral;   PORTACATH PLACEMENT Right 06/18/2020   Procedure: INSERTION PORT-A-CATH WITH ULTRASOUND GUIDANCE;  Surgeon: Harriette Bouillon, MD;  Location: [facility];  Service: General;  Laterality: Right;   RE-EXCISION OF BREAST LUMPECTOMY Left 12/09/2020   Procedure: RE-EXCISION OF LEFT BREAST LUMPECTOMY;  Surgeon: Harriette Bouillon, MD;  Location: [facility];  Service: General;  Laterality: Left;    SOCIAL HISTORY: Social History   Socioeconomic History   Marital status: Single    Spouse name: Not on file   Number of children: 2   Years of education: 12   Highest education level: Not on file  Occupational History  Not on file  Tobacco Use   Smoking status: Never    Passive exposure: Never   Smokeless tobacco: Never  Vaping Use   Vaping status: Never Used  Substance and Sexual Activity   Alcohol use: Yes    Comment: socially   Drug use: Yes    Types: Marijuana    Comment: occass   Sexual activity: Not on file  Other Topics Concern   Not on file  Social History Narrative   Right handed   Caffeine use: drinks about 1 cup coffee every morning (5 days per week)   Social Determinants of Health   Financial Resource Strain: High Risk (09/18/2021)   Overall Financial Resource Strain (CARDIA)    Difficulty of Paying Living Expenses: Very hard  Food Insecurity: Food  Insecurity Present (09/18/2021)   Hunger Vital Sign    Worried About Running Out of Food in the Last Year: Sometimes true    Ran Out of Food in the Last Year: Sometimes true  Transportation Needs: No Transportation Needs (09/18/2021)   PRAPARE - Administrator, Civil Service (Medical): No    Lack of Transportation (Non-Medical): No  Physical Activity: Not on file  Stress: Stress Concern Present (09/18/2021)   [employer] of Occupational Health - Occupational Stress Questionnaire    Feeling of Stress : Very much  Social Connections: Unknown (12/31/2022)   Received from [facility]   Social Network    Social Network: Not on file  Intimate Partner Violence: Unknown (12/31/2022)   Received from [facility]   HITS    Physically Hurt: Not on file    Insult or Talk Down To: Not on file    Threaten Physical Harm: Not on file    Scream or Curse: Not on file    FAMILY HISTORY: Family History  Problem Relation Age of Onset   Asthma Mother    High blood pressure Mother    High blood pressure Father    High Cholesterol Father    Asthma Brother    Ovarian cancer Paternal Grandmother        dx 89s   Prostate cancer Paternal Grandfather        dx 66s   Lung cancer Maternal Uncle        dx late 37s   Prostate cancer Paternal Uncle        dx late 60s   Prostate cancer Paternal Uncle        dx early 61s    Review of Systems  Constitutional:  Positive for fatigue. Negative for appetite change, chills, fever and unexpected weight change.  HENT:   Negative for hearing loss, lump/mass, mouth sores, sore throat and trouble swallowing.   Eyes:  Negative for eye problems and icterus.  Respiratory:  Negative for chest tightness, cough and shortness of breath.   Cardiovascular:  Negative for chest pain, leg swelling and palpitations.  Gastrointestinal:  Negative for abdominal distention, abdominal pain, constipation, diarrhea, nausea and vomiting.  Endocrine: Negative for hot  flashes.  Genitourinary:  Negative for difficulty urinating.   Musculoskeletal:  Negative for arthralgias.  Skin:  Negative for itching and rash.  Neurological:  Negative for dizziness, extremity weakness, headaches and numbness.  Hematological:  Negative for adenopathy. Does not bruise/bleed easily.  Psychiatric/Behavioral:  Negative for depression. The patient is not nervous/anxious.       PHYSICAL EXAMINATION  ECOG PERFORMANCE STATUS: 1 - Symptomatic but completely ambulatory  Vitals:   07/06/23 1308  BP: 123/82  Pulse: 91  Resp: 18  Temp: 98.1 F (36.7 C)  SpO2: 98%   Physical Exam Constitutional:      Appearance: Normal appearance.  HENT:     Head: Normocephalic and atraumatic.  Cardiovascular:     Rate and Rhythm: Normal rate and regular rhythm.     Pulses: Normal pulses.     Heart sounds: Normal heart sounds.  Pulmonary:     Effort: Pulmonary effort is normal.     Breath sounds: Normal breath sounds.  Chest:     Comments: Bilateral breast examined.  No palpable masses or regional adenopathy Musculoskeletal:        General: No swelling.     Cervical back: No rigidity.  Lymphadenopathy:     Cervical: No cervical adenopathy.  Skin:    General: Skin is warm and dry.  Neurological:     Mental Status: She is alert.     LABORATORY DATA:  CBC    Component Value Date/Time   WBC 4.8 06/04/2022 0928   WBC 4.8 05/20/2022 1252   RBC 4.55 06/04/2022 0928   RBC 4.28 05/20/2022 1252   HGB 12.6 06/04/2022 0928   HCT 40.1 06/04/2022 0928   PLT 290 06/04/2022 0928   MCV 88 06/04/2022 0928   MCH 27.7 06/04/2022 0928   MCH 28.7 05/20/2022 1252   MCHC 31.4 (L) 06/04/2022 0928   MCHC 33.7 05/20/2022 1252   RDW 14.5 06/04/2022 0928   LYMPHSABS 2.3 06/04/2022 0928   MONOABS 0.4 05/20/2022 1252   EOSABS 0.5 (H) 06/04/2022 0928   BASOSABS 0.0 06/04/2022 0928    CMP     Component Value Date/Time   NA 137 05/20/2022 1252   K 4.0 05/20/2022 1252   CL 104  05/20/2022 1252   CO2 28 05/20/2022 1252   GLUCOSE 92 05/20/2022 1252   BUN 18 05/20/2022 1252   CREATININE 0.99 05/20/2022 1252   CREATININE 1.03 (H) 10/14/2021 1356   CALCIUM 9.6 05/20/2022 1252   PROT 7.7 05/20/2022 1252   ALBUMIN 4.5 05/20/2022 1252   AST 18 05/20/2022 1252   AST 18 10/14/2021 1356   ALT 21 05/20/2022 1252   ALT 21 10/14/2021 1356   ALKPHOS 82 05/20/2022 1252   BILITOT 0.5 05/20/2022 1252   BILITOT 0.8 10/14/2021 1356   GFRNONAA >60 05/20/2022 1252   GFRNONAA >60 10/14/2021 1356   GFRAA >60 08/05/2020 0955   GFRAA >60 06/04/2020 0830    ASSESSMENT and THERAPY PLAN:   Malignant neoplasm of upper-outer quadrant of left breast in female, estrogen receptor negative (HCC) Ms. 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 She is here for surveillance. There is no concern for disease recurrence. Her complaints today are related to ongoing panuveitis, treated by ophthalmology, rheumatology, inability to lose weight and some LE cramps. I recommended referral to weight loss clinic to help with weight management. We appreciate our rheumatology and ophthalmology team to assist with pan uveitis. With regards to LE cramps, advise hydration, adequate potassium intake and mg supplements. Mammogram June 2024 neg for malignancy. Repeat in June 2025.   Total time spent: 30 min  *Total Encounter Time as defined by the Centers for Medicare and Medicaid Services includes, in addition to the face-to-face time of a patient visit (documented in the note above) non-face-to-face time: obtaining and reviewing outside history, ordering and reviewing medications, tests or procedures,  care coordination (communications with other health care professionals or caregivers) and documentation in the medical record.

## 2023-07-06 NOTE — Assessment & Plan Note (Addendum)
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 She is here for surveillance. There is no concern for disease recurrence. Her complaints today are related to ongoing panuveitis, treated by ophthalmology, rheumatology, inability to lose weight and some LE cramps. I recommended referral to weight loss clinic to help with weight management. We appreciate our rheumatology and ophthalmology team to assist with pan uveitis. With regards to LE cramps, advise hydration, adequate potassium intake and mg supplements. Mammogram June 2024 neg for malignancy. Repeat in June 2025.

## 2023-07-08 ENCOUNTER — Other Ambulatory Visit (HOSPITAL_COMMUNITY): Payer: Self-pay

## 2023-07-08 MED ORDER — PIMECROLIMUS 1 % EX CREA: TOPICAL_CREAM | Freq: Two times a day (BID) | CUTANEOUS | 5 refills | Status: AC

## 2023-07-21 ENCOUNTER — Ambulatory Visit (INDEPENDENT_AMBULATORY_CARE_PROVIDER_SITE_OTHER): Payer: 59

## 2023-07-21 DIAGNOSIS — J4550 Severe persistent asthma, uncomplicated: Secondary | ICD-10-CM | POA: Diagnosis not present

## 2023-08-02 ENCOUNTER — Other Ambulatory Visit: Payer: Self-pay | Admitting: Internal Medicine

## 2023-08-05 ENCOUNTER — Ambulatory Visit (INDEPENDENT_AMBULATORY_CARE_PROVIDER_SITE_OTHER): Payer: 59

## 2023-08-05 DIAGNOSIS — J4550 Severe persistent asthma, uncomplicated: Secondary | ICD-10-CM | POA: Diagnosis not present

## 2023-08-19 ENCOUNTER — Ambulatory Visit: Payer: 59

## 2023-08-19 DIAGNOSIS — J4550 Severe persistent asthma, uncomplicated: Secondary | ICD-10-CM

## 2023-08-29 NOTE — Progress Notes (Signed)
Chief Complaint  Patient presents with   Room 1    Pt is here Alone. Pt states that her migraines has been controlled. Pt states that things have been going well. Pt states that she gets 1 migraine per week,2-3 migraines per month.     HISTORY OF PRESENT ILLNESS:  08/30/23 ALL:  Jeanne Evans is a 37 y.o. female here today for follow up for migraines. She was seen in consult with Dr Delena Bali 02/2023 for new onset migraines in setting of chemotherapy. She was started on rizatriptan as needed. Since, she reports doing well. She may have 2-3 migraines per month. Rizatritpan works well.    HISTORY (copied from Dr Quentin Mulling previous note)  Medical co-morbidities: asthma, breast cancer s/p chemo and lumpectomy, granulomatous uveitis, kidney stones   The patient presents for evaluation of headaches which began 5 months ago. Headaches began after she started chemo/radiation for breast cancer. They are associated with photophobia and phonophobia. Can last 2-3 hours at a time. Initially they were occurring 2-3 times per week, but they have improved in frequency since she started prednisone and anti-inflammatories for uveitis. Her last headache was 3 weeks ago.    MRI brain 12/20/22 was suggestive of sinusitis and was otherwise unremarkable.   Headache History: Onset: 5 months ago Triggers: none, woke up with them Aura: none Location: frontal, retro-orbital Quality/Description: pressure, throbbing Associated Symptoms:             Photophobia: yes             Phonophobia: yes             Nausea: no Worse with activity?: yes Duration of headaches: 2 hours   Migraine days per month: 1 Headache free days per month: 29   Current Treatment: Abortive Ibuprofen tylenol   Preventative none   Prior Therapies                                 Ibuprofen tylenol   REVIEW OF SYSTEMS: Out of a complete 14 system review of symptoms, the patient complains only of the following symptoms,  headaches and all other reviewed systems are negative.   ALLERGIES: Allergies  Allergen Reactions   Sulfa Antibiotics Hives     HOME MEDICATIONS: Outpatient Medications Prior to Visit  Medication Sig Dispense Refill   Acetaminophen 325 MG CAPS Take by mouth.     albuterol (VENTOLIN HFA) 108 (90 Base) MCG/ACT inhaler INHALE 2 PUFFS INTO THE LUNGS EVERY 6 HOURS AS NEEDED FOR WHEEZING OR SHORTNESS OF BREATH 6.7 g 1   azaTHIOprine (IMURAN) 50 MG tablet Take 50 mg by mouth daily.     azelastine (ASTELIN) 0.1 % nasal spray Place 2 sprays into both nostrils 2 (two) times daily. Use in each nostril as directed 30 mL 6   clobetasol ointment (TEMOVATE) 0.05 % Apply 1 Application topically 2 (two) times daily. 30 g 0   dorzolamide-timolol (COSOPT) 2-0.5 % ophthalmic solution Place 1 drop into both eyes 2 (two) times daily.     DUPIXENT 300 MG/2ML prefilled syringe Inject 300 mg into the skin every 14 (fourteen) days. 3.92 mL 11   fluticasone (FLONASE) 50 MCG/ACT nasal spray Place 2 sprays into both nostrils daily. 16 g 2   fluticasone furoate-vilanterol (BREO ELLIPTA) 100-25 MCG/ACT AEPB Inhale 1 puff into the lungs daily. 60 each 5   Fluticasone-Umeclidin-Vilant (TRELEGY ELLIPTA) 200-62.5-25 MCG/ACT AEPB Inhale  1 puff into the lungs daily. 1 each 6   ipratropium (ATROVENT) 0.06 % nasal spray 1 spray each nostril up to 3 times daily as needed for runny nose. 15 mL 5   levonorgestrel (MIRENA, 52 MG,) 20 MCG/DAY IUD by Intrauterine route.     meloxicam (MOBIC) 15 MG tablet Take 1 tablet (15 mg total) by mouth daily. 60 tablet 1   montelukast (SINGULAIR) 10 MG tablet Take 10 mg by mouth as needed.     naphazoline-pheniramine (NAPHCON-A) 0.025-0.3 % ophthalmic solution Place 1 drop into both eyes 4 (four) times daily as needed.     ofloxacin (OCUFLOX) 0.3 % ophthalmic solution Place 1 drop into both eyes 4 (four) times daily.     Olopatadine-Mometasone (RYALTRIS) [id number] MCG/ACT SUSP Place 1 spray into  the nose daily. 29 g 5   omeprazole (PRILOSEC) 40 MG capsule Take 1 capsule (40 mg total) by mouth daily. First thing in the morning on an empty stomach 30 capsule 3   pimecrolimus (ELIDEL) 1 % cream Apply topically 2 (two) times daily. 100 g 5   prednisoLONE acetate (PRED FORTE) 1 % ophthalmic suspension Apply to eye.     tacrolimus (PROTOPIC) 0.1 % ointment Apply topically 2 (two) times daily. 100 g 5   rizatriptan (MAXALT) 10 MG tablet Take 1 tablet (10 mg total) by mouth as needed for migraine. May repeat in 2 hours if needed 10 tablet 6   Facility-Administered Medications Prior to Visit  Medication Dose Route Frequency Provider Last Rate Last Admin   dupilumab (DUPIXENT) prefilled syringe 300 mg  300 mg Subcutaneous Q14 Days Marcelyn Bruins, MD   300 mg at 08/19/23 9562     PAST MEDICAL HISTORY: Past Medical History:  Diagnosis Date   Asthma 07/31/2021   Breast cancer [facility])    Family history of ovarian cancer    Family history of prostate cancer    GERD (gastroesophageal reflux disease)    History of asthma    has albuterol, uses PRN   History of heart murmur in childhood    History of multiple allergies      PAST SURGICAL HISTORY: Past Surgical History:  Procedure Laterality Date   BREAST LUMPECTOMY WITH RADIOACTIVE SEED AND SENTINEL LYMPH NODE BIOPSY Left 11/19/2020   Procedure: LEFT BREAST LUMPECTOMY X 2 WITH RADIOACTIVE SEED AND SENTINEL LYMPH NODE MAPPING;  Surgeon: Harriette Bouillon, MD;  Location: [facility];  Service: General;  Laterality: Left;  PECTORAL BLOCK   BREAST REDUCTION SURGERY Right 10/27/2021   Procedure: MAMMARY REDUCTION  TO RIGHT BREAST;  Surgeon: Allena Napoleon, MD;  Location: [facility];  Service: Plastics;  Laterality: Right;   LIPOSUCTION WITH LIPOFILLING Bilateral 10/27/2021   Procedure: BILATERAL FAT GRAFTING TO BREAST;  Surgeon: Allena Napoleon, MD;  Location: [facility];  Service: Plastics;   Laterality: Bilateral;   PORTACATH PLACEMENT Right 06/18/2020   Procedure: INSERTION PORT-A-CATH WITH ULTRASOUND GUIDANCE;  Surgeon: Harriette Bouillon, MD;  Location: [facility];  Service: General;  Laterality: Right;   RE-EXCISION OF BREAST LUMPECTOMY Left 12/09/2020   Procedure: RE-EXCISION OF LEFT BREAST LUMPECTOMY;  Surgeon: Harriette Bouillon, MD;  Location:  SURGERY CENTER;  Service: General;  Laterality: Left;     FAMILY HISTORY: Family History  Problem Relation Age of Onset   Asthma Mother    High blood pressure Mother    High blood pressure Father    High Cholesterol Father  Asthma Brother    Ovarian cancer Paternal Grandmother        dx 63s   Prostate cancer Paternal Grandfather        dx 79s   Lung cancer Maternal Uncle        dx late 82s   Prostate cancer Paternal Uncle        dx late 54s   Prostate cancer Paternal Uncle        dx early 72s     SOCIAL HISTORY: Social History   Socioeconomic History   Marital status: Single    Spouse name: Not on file   Number of children: 2   Years of education: 12   Highest education level: Not on file  Occupational History   Not on file  Tobacco Use   Smoking status: Never    Passive exposure: Never   Smokeless tobacco: Never  Vaping Use   Vaping status: Never Used  Substance and Sexual Activity   Alcohol use: Yes    Comment: socially   Drug use: Yes    Types: Marijuana    Comment: occass   Sexual activity: Not on file  Other Topics Concern   Not on file  Social History Narrative   Right handed   Caffeine use: drinks about 1 cup coffee every morning (5 days per week)   Social Determinants of Health   Financial Resource Strain: High Risk (09/18/2021)   Overall Financial Resource Strain (CARDIA)    Difficulty of Paying Living Expenses: Very hard  Food Insecurity: Food Insecurity Present (09/18/2021)   Hunger Vital Sign    Worried About Running Out of Food in the Last Year: Sometimes true     Ran Out of Food in the Last Year: Sometimes true  Transportation Needs: No Transportation Needs (09/18/2021)   PRAPARE - Administrator, Civil Service (Medical): No    Lack of Transportation (Non-Medical): No  Physical Activity: Not on file  Stress: Stress Concern Present (09/18/2021)   [employer] of Occupational Health - Occupational Stress Questionnaire    Feeling of Stress : Very much  Social Connections: Unknown (12/31/2022)   Received from [facility]   Social Network    Social Network: Not on file  Intimate Partner Violence: Unknown (12/31/2022)   Received from [facility], [facility]   HITS    Physically Hurt: Not on file    Insult or Talk Down To: Not on file    Threaten Physical Harm: Not on file    Scream or Curse: Not on file     PHYSICAL EXAM  Vitals:   08/30/23 1425  BP: (!) 152/95  Pulse: 68  Weight: 200 lb (90.7 kg)  Height: 5\' 2"  (1.575 m)   Body mass index is 36.58 kg/m.  Generalized: Well developed, in no acute distress  Cardiology: normal rate and rhythm, no murmur auscultated  Respiratory: clear to auscultation bilaterally    Neurological examination  Mentation: Alert oriented to time, place, history taking. Follows all commands speech and language fluent Cranial nerve II-XII: Pupils were equal round reactive to light. Extraocular movements were full, visual field were full on confrontational test. Facial sensation and strength were normal. Uvula tongue midline. Head turning and shoulder shrug  were normal and symmetric. Motor: The motor testing reveals 5 over 5 strength of all 4 extremities. Good symmetric motor tone is noted throughout.  Gait and station: Gait is normal.    DIAGNOSTIC DATA (LABS, IMAGING, TESTING) -  I reviewed patient records, labs, notes, testing and imaging myself where available.  Lab Results  Component Value Date   WBC 4.8 06/04/2022   HGB 12.6 06/04/2022   HCT 40.1 06/04/2022    MCV 88 06/04/2022   PLT 290 06/04/2022      Component Value Date/Time   NA 137 05/20/2022 1252   K 4.0 05/20/2022 1252   CL 104 05/20/2022 1252   CO2 28 05/20/2022 1252   GLUCOSE 92 05/20/2022 1252   BUN 18 05/20/2022 1252   CREATININE 0.99 05/20/2022 1252   CREATININE 1.03 (H) 10/14/2021 1356   CALCIUM 9.6 05/20/2022 1252   PROT 7.7 05/20/2022 1252   ALBUMIN 4.5 05/20/2022 1252   AST 18 05/20/2022 1252   AST 18 10/14/2021 1356   ALT 21 05/20/2022 1252   ALT 21 10/14/2021 1356   ALKPHOS 82 05/20/2022 1252   BILITOT 0.5 05/20/2022 1252   BILITOT 0.8 10/14/2021 1356   GFRNONAA >60 05/20/2022 1252   GFRNONAA >60 10/14/2021 1356   GFRAA >60 08/05/2020 0955   GFRAA >60 06/04/2020 0830   No results found for: "CHOL", "HDL", "LDLCALC", "LDLDIRECT", "TRIG", "CHOLHDL" No results found for: "HGBA1C" No results found for: "VITAMINB12" Lab Results  Component Value Date   TSH 0.850 05/20/2022        No data to display               No data to display           ASSESSMENT AND PLAN  37 y.o. year old female  has a past medical history of Asthma (07/31/2021), Breast cancer (HCC), Family history of ovarian cancer, Family history of prostate cancer, GERD (gastroesophageal reflux disease), History of asthma, History of heart murmur in childhood, and History of multiple allergies. here with    Migraine without aura and without status migrainosus, not intractable  Merla D Zicari is doing well. She will continue rizatriptan as needed. Healthy lifestyle habits encouraged. She will follow up with PCP as directed. She will return to see me as needed. She verbalizes understanding and agreement with this plan.   No orders of the defined types were placed in this encounter.    Meds ordered this encounter  Medications   rizatriptan (MAXALT) 10 MG tablet    Sig: Take 1 tablet (10 mg total) by mouth as needed for migraine. May repeat in 2 hours if needed    Dispense:  10  tablet    Refill:  11    Order Specific Question:   Supervising Provider    Answer:   Anson Fret [[id number]]     Shawnie Dapper, MSN, FNP-C 08/30/2023, 4:09 PM  [facility] [address] [place], [place] [address] [phone]

## 2023-08-30 ENCOUNTER — Encounter: Payer: Self-pay | Admitting: Family Medicine

## 2023-08-30 ENCOUNTER — Ambulatory Visit (INDEPENDENT_AMBULATORY_CARE_PROVIDER_SITE_OTHER): Payer: 59 | Admitting: Family Medicine

## 2023-08-30 VITALS — BP 152/95 | HR 68 | Ht 62.0 in | Wt 200.0 lb

## 2023-08-30 DIAGNOSIS — G43009 Migraine without aura, not intractable, without status migrainosus: Secondary | ICD-10-CM

## 2023-08-30 MED ORDER — RIZATRIPTAN BENZOATE 10 MG PO TABS: 10.0000 mg | ORAL_TABLET | ORAL | 11 refills | Status: AC | PRN

## 2023-08-30 NOTE — Patient Instructions (Signed)
Below is our plan:  We will continue rizatriptan as needed   Please make sure you are staying well hydrated. I recommend 50-60 ounces daily. Well balanced diet and regular exercise encouraged. Consistent sleep schedule with 6-8 hours recommended.   Please continue follow up with care team as directed.   Follow up with me as needed, may continue refills with PCP   You may receive a survey regarding today's visit. I encourage you to leave honest feed back as I do use this information to improve patient care. Thank you for seeing me today!   GENERAL HEADACHE INFORMATION:   Natural supplements: Magnesium Oxide or Magnesium Glycinate 500 mg at bed (up to 800 mg daily) Coenzyme Q10 300 mg in AM Vitamin B2- 200 mg twice a day   Add 1 supplement at a time since even natural supplements can have undesirable side effects. You can sometimes buy supplements cheaper (especially Coenzyme Q10) at www.WebmailGuide.co.za or at [facility].  Migraine with aura: There is increased risk for stroke in women with migraine with aura and a contraindication for the combined contraceptive pill for use by women who have migraine with aura. The risk for women with migraine without aura is lower. However other risk factors like smoking are far more likely to increase stroke risk than migraine. There is a recommendation for no smoking and for the use of OCPs without estrogen such as progestogen only pills particularly for women with migraine with aura.Marland Kitchen People who have migraine headaches with auras may be 3 times more likely to have a stroke caused by a blood clot, compared to migraine patients who don't see auras. Women who take hormone-replacement therapy may be 30 percent more likely to suffer a clot-based stroke than women not taking medication containing estrogen. Other risk factors like smoking and high blood pressure may be  much more important.    Vitamins and herbs that show potential:   Magnesium: Magnesium (250 mg twice a  day or 500 mg at bed) has a relaxant effect on smooth muscles such as blood vessels. Individuals suffering from frequent or daily headache usually have low magnesium levels which can be increase with daily supplementation of 400-750 mg. Three trials found 40-90% average headache reduction  when used as a preventative. Magnesium may help with headaches are aura, the best evidence for magnesium is for migraine with aura is its thought to stop the cortical spreading depression we believe is the pathophysiology of migraine aura.Magnesium also demonstrated the benefit in menstrually related migraine.  Magnesium is part of the messenger system in the serotonin cascade and it is a good muscle relaxant.  It is also useful for constipation which can be a side effect of other medications used to treat migraine. Good sources include nuts, whole grains, and tomatoes. Side Effects: loose stool/diarrhea  Riboflavin (vitamin B 2) 200 mg twice a day. This vitamin assists nerve cells in the production of ATP a principal energy storing molecule.  It is necessary for many chemical reactions in the body.  There have been at least 3 clinical trials of riboflavin using 400 mg per day all of which suggested that migraine frequency can be decreased.  All 3 trials showed significant improvement in over half of migraine sufferers.  The supplement is found in bread, cereal, milk, meat, and poultry.  Most Americans get more riboflavin than the recommended daily allowance, however riboflavin deficiency is not necessary for the supplements to help prevent headache. Side effects: energizing, green urine  Coenzyme Q10: This is present in almost all cells in the body and is critical component for the conversion of energy.  Recent studies have shown that a nutritional supplement of CoQ10 can reduce the frequency of migraine attacks by improving the energy production of cells as with riboflavin.  Doses of 150 mg twice a day have been shown to be  effective.   Melatonin: Increasing evidence shows correlation between melatonin secretion and headache conditions.  Melatonin supplementation has decreased headache intensity and duration.  It is widely used as a sleep aid.  Sleep is natures way of dealing with migraine.  A dose of 3 mg is recommended to start for headaches including cluster headache. Higher doses up to 15 mg has been reviewed for use in Cluster headache and have been used. The rationale behind using melatonin for cluster is that many theories regarding the cause of Cluster headache center around the disruption of the normal circadian rhythm in the brain.  This helps restore the normal circadian rhythm.   HEADACHE DIET: Foods and beverages which may trigger migraine Note that only 20% of headache patients are food sensitive. You will know if you are food sensitive if you get a headache consistently 20 minutes to 2 hours after eating a certain food. Only cut out a food if it causes headaches, otherwise you might remove foods you enjoy! What matters most for diet is to eat a well balanced healthy diet full of vegetables and low fat protein, and to not miss meals.   Chocolate, other sweets ALL cheeses except cottage and cream cheese Dairy products, yogurt, sour cream, ice cream Liver Meat extracts (Bovril, Marmite, meat tenderizers) Meats or fish which have undergone aging, fermenting, pickling or smoking. These include: Hotdogs,salami,Lox,sausage, mortadellas,smoked salmon, pepperoni, Pickled herring Pods of broad bean (English beans, Chinese pea pods, [place] (fava) beans, lima and navy beans Ripe avocado, ripe banana Yeast extracts or active yeast preparations such as Brewer's or Fleishman's (commercial bakes goods are permitted) Tomato based foods, pizza (lasagna, etc.)   MSG (monosodium glutamate) is disguised as many things; look for these common aliases: Monopotassium glutamate Autolysed yeast Hydrolysed protein Sodium  caseinate "flavorings" "all natural preservatives" Nutrasweet   Avoid all other foods that convincingly provoke headaches.   Resources: The Dizzy [place] Laundry Your Headache Diet, migrainestrong.com  https://zamora-andrews.com/   Caffeine and Migraine For patients that have migraine, caffeine intake more than 3 days per week can lead to dependency and increased migraine frequency. I would recommend cutting back on your caffeine intake as best you can. The recommended amount of caffeine is 200-300 mg daily, although migraine patients may experience dependency at even lower doses. While you may notice an increase in headache temporarily, cutting back will be helpful for headaches in the long run. For more information on caffeine and migraine, visit: https://americanmigrainefoundation.org/resource-library/caffeine-and-migraine/   Headache Prevention Strategies:   1. Maintain a headache diary; learn to identify and avoid triggers.  - This can be a simple note where you log when you had a headache, associated symptoms, and medications used - There are several smartphone apps developed to help track migraines: Migraine Buddy, Migraine Monitor, Curelator N1-Headache App   Common triggers include: Emotional triggers: Emotional/Upset family or friends Emotional/Upset occupation Business reversal/success Anticipation anxiety Crisis-serious Post-crisis periodNew job/position   Physical triggers: Vacation Day Weekend Strenuous Exercise High Altitude Location New Move Menstrual Day Physical Illness Oversleep/Not enough sleep Weather changes Light: Photophobia or light sesnitivity treatment involves a balance between desensitization and reduction  in overly strong input. Use dark polarized glasses outside, but not inside. Avoid bright or fluorescent light, but do not dim environment to the point that going into a normally lit room hurts. Consider  FL-41 tint lenses, which reduce the most irritating wavelengths without blocking too much light.  These can be obtained at axonoptics.com or theraspecs.com Foods: see list above.   2. Limit use of acute treatments (over-the-counter medications, triptans, etc.) to no more than 2 days per week or 10 days per month to prevent medication overuse headache (rebound headache).     3. Follow a regular schedule (including weekends and holidays): Don't skip meals. Eat a balanced diet. 8 hours of sleep nightly. Minimize stress. Exercise 30 minutes per day. Being overweight is associated with a 5 times increased risk of chronic migraine. Keep well hydrated and drink 6-8 glasses of water per day.   4. Initiate non-pharmacologic measures at the earliest onset of your headache. Rest and quiet environment. Relax and reduce stress. Breathe2Relax is a free app that can instruct you on    some simple relaxtion and breathing techniques. Http://Dawnbuse.com is a    free website that provides teaching videos on relaxation.  Also, there are  many apps that   can be downloaded for "mindful" relaxation.  An app called YOGA NIDRA will help walk you through mindfulness. Another app called Calm can be downloaded to give you a structured mindfulness guide with daily reminders and skill development. Headspace for guided meditation Mindfulness Based Stress Reduction Online Course: www.palousemindfulness.com Cold compresses.   5. Don't wait!! Take the maximum allowable dosage of prescribed medication at the first sign of migraine.   6. Compliance:  Take prescribed medication regularly as directed and at the first sign of a migraine.   7. Communicate:  Call your physician when problems arise, especially if your headaches change, increase in frequency/severity, or become associated with neurological symptoms (weakness, numbness, slurred speech, etc.). Proceed to emergency room if you experience new or worsening symptoms or  symptoms do not resolve, if you have new neurologic symptoms or if headache is severe, or for any concerning symptom.   8. Headache/pain management therapies: Consider various complementary methods, including medication, behavioral therapy, psychological counselling, biofeedback, massage therapy, acupuncture, dry needling, and other modalities.  Such measures may reduce the need for medications. Counseling for pain management, where patients learn to function and ignore/minimize their pain, seems to work very well.   9. Recommend changing family's attention and focus away from patient's headaches. Instead, emphasize daily activities. If first question of day is 'How are your headaches/Do you have a headache today?', then patient will constantly think about headaches, thus making them worse. Goal is to re-direct attention away from headaches, toward daily activities and other distractions.   10. Helpful Websites: www.AmericanHeadacheSociety.org PatentHood.ch www.headaches.org TightMarket.nl www.achenet.org

## 2023-08-31 ENCOUNTER — Encounter: Payer: Self-pay | Admitting: Hematology and Oncology

## 2023-08-31 NOTE — Telephone Encounter (Signed)
TC

## 2023-09-02 ENCOUNTER — Ambulatory Visit: Payer: 59

## 2023-09-02 DIAGNOSIS — J4550 Severe persistent asthma, uncomplicated: Secondary | ICD-10-CM | POA: Diagnosis not present

## 2023-09-16 ENCOUNTER — Ambulatory Visit (INDEPENDENT_AMBULATORY_CARE_PROVIDER_SITE_OTHER): Payer: 59 | Admitting: *Deleted

## 2023-09-16 DIAGNOSIS — J4550 Severe persistent asthma, uncomplicated: Secondary | ICD-10-CM

## 2023-09-30 ENCOUNTER — Ambulatory Visit (INDEPENDENT_AMBULATORY_CARE_PROVIDER_SITE_OTHER): Payer: 59 | Admitting: *Deleted

## 2023-09-30 DIAGNOSIS — J4550 Severe persistent asthma, uncomplicated: Secondary | ICD-10-CM | POA: Diagnosis not present

## 2023-10-09 ENCOUNTER — Emergency Department (HOSPITAL_COMMUNITY)
Admission: EM | Admit: 2023-10-09 | Discharge: 2023-10-09 | Disposition: A | Discharge status: Home / Self Care | Payer: 59 | Attending: Emergency Medicine | Admitting: Emergency Medicine

## 2023-10-09 ENCOUNTER — Other Ambulatory Visit: Payer: Self-pay

## 2023-10-09 ENCOUNTER — Emergency Department (HOSPITAL_COMMUNITY): Payer: 59

## 2023-10-09 ENCOUNTER — Encounter (HOSPITAL_COMMUNITY): Payer: Self-pay

## 2023-10-09 DIAGNOSIS — R079 Chest pain, unspecified: Secondary | ICD-10-CM

## 2023-10-09 DIAGNOSIS — R109 Unspecified abdominal pain: Secondary | ICD-10-CM | POA: Diagnosis not present

## 2023-10-09 DIAGNOSIS — Z853 Personal history of malignant neoplasm of breast: Secondary | ICD-10-CM | POA: Insufficient documentation

## 2023-10-09 DIAGNOSIS — R0789 Other chest pain: Secondary | ICD-10-CM | POA: Insufficient documentation

## 2023-10-09 LAB — BASIC METABOLIC PANEL
Anion gap: 9 (ref 5–15)
BUN: 11 mg/dL (ref 6–20)
CO2: 24 mmol/L (ref 22–32)
Calcium: 9.2 mg/dL (ref 8.9–10.3)
Chloride: 101 mmol/L (ref 98–111)
Creatinine, Ser: 0.8 mg/dL (ref 0.44–1.00)
GFR, Estimated: >60 mL/min (ref >60)
Glucose, Bld: 91 mg/dL (ref 70–99)
Potassium: 3.7 mmol/L (ref 3.5–5.1)
Sodium: 134 mmol/L — ABNORMAL LOW (ref 135–145)

## 2023-10-09 LAB — CBC WITH DIFFERENTIAL/PLATELET
Abs Immature Granulocytes: 0.01 10*3/uL (ref 0.00–0.07)
Basophils Absolute: 0 10*3/uL (ref 0.0–0.1)
Basophils Relative: 0 %
Eosinophils Absolute: 0.1 10*3/uL (ref 0.0–0.5)
Eosinophils Relative: 2 %
HCT: 40.1 % (ref 36.0–46.0)
Hemoglobin: 13.6 g/dL (ref 12.0–15.0)
Immature Granulocytes: 0 %
Lymphocytes Relative: 43 %
Lymphs Abs: 2 10*3/uL (ref 0.7–4.0)
MCH: 29.4 pg (ref 26.0–34.0)
MCHC: 33.9 g/dL (ref 30.0–36.0)
MCV: 86.8 fL (ref 80.0–100.0)
Monocytes Absolute: 0.4 10*3/uL (ref 0.1–1.0)
Monocytes Relative: 9 %
Neutro Abs: 2.1 10*3/uL (ref 1.7–7.7)
Neutrophils Relative %: 46 %
Platelets: 262 10*3/uL (ref 150–400)
RBC: 4.62 MIL/uL (ref 3.87–5.11)
RDW: 14.5 % (ref 11.5–15.5)
WBC: 4.5 10*3/uL (ref 4.0–10.5)
nRBC: 0 % (ref 0.0–0.2)

## 2023-10-09 LAB — TROPONIN I (HIGH SENSITIVITY): Troponin I (High Sensitivity): 5 ng/L (ref <18)

## 2023-10-09 LAB — HCG, SERUM, QUALITATIVE: Preg, Serum: NEGATIVE

## 2023-10-09 MED ORDER — IOHEXOL 350 MG/ML SOLN
75.0000 mL | Freq: Once | INTRAVENOUS | Status: AC | PRN
  Administered 2023-10-09: 75 mL via INTRAVENOUS

## 2023-10-09 MED ORDER — TRAMADOL HCL 50 MG PO TABS: 50.0000 mg | ORAL_TABLET | Freq: Four times a day (QID) | ORAL | 0 refills | Status: AC | PRN

## 2023-10-09 MED ORDER — KETOROLAC TROMETHAMINE 30 MG/ML IJ SOLN
30.0000 mg | Freq: Once | INTRAMUSCULAR | Status: AC
  Administered 2023-10-09: 30 mg via INTRAVENOUS
  Filled 2023-10-09: qty 1

## 2023-10-09 NOTE — ED Notes (Signed)
Pt given [place] sandwich, soda, and saltine/graham crackers

## 2023-10-09 NOTE — ED Provider Notes (Signed)
[place] EMERGENCY DEPARTMENT AT [facility] Provider Note   CSN: [id number] Arrival date & time: 10/09/23  1145     History  Chief Complaint  Patient presents with   Abdominal Pain    Jeanne Evans is a 37 y.o. female.  HPI   37 year old female presents emergency department with pain underneath her right breast.  Past medical history of breast cancer status post surgery/chemotherapy.  Patient states this has been going on for about 3 days.  It is worse with inhalation and certain movements.  Worse when she lays on the right side.  The pain does not radiate into her chest, abdomen or back.  It does not cause her any shortness of breath.  She denies any cough or hemoptysis.  No swelling of her lower extremities.  No history of DVT or oral contraceptive use.  Home Medications Prior to Admission medications   Medication Sig Start Date End Date Taking? Authorizing Provider  Acetaminophen 325 MG CAPS Take by mouth.    [provider]  albuterol (VENTOLIN HFA) 108 (90 Base) MCG/ACT inhaler INHALE 2 PUFFS INTO THE LUNGS EVERY 6 HOURS AS NEEDED FOR WHEEZING OR SHORTNESS OF BREATH 08/03/23   Kalman Shan, MD  azaTHIOprine (IMURAN) 50 MG tablet Take 50 mg by mouth daily. 01/18/23   [provider]  azelastine (ASTELIN) 0.1 % nasal spray Place 2 sprays into both nostrils 2 (two) times daily. Use in each nostril as directed 06/04/22   Ferol Luz, MD  clobetasol ointment (TEMOVATE) 0.05 % Apply 1 Application topically 2 (two) times daily. 10/22/22   Ferol Luz, MD  dorzolamide-timolol (COSOPT) 2-0.5 % ophthalmic solution Place 1 drop into both eyes 2 (two) times daily.    [provider]  DUPIXENT 300 MG/2ML prefilled syringe Inject 300 mg into the skin every 14 (fourteen) days. 05/18/23   Ferol Luz, MD  fluticasone (FLONASE) 50 MCG/ACT nasal spray Place 2 sprays into both nostrils daily. 06/04/22   Ferol Luz, MD  fluticasone  furoate-vilanterol (BREO ELLIPTA) 100-25 MCG/ACT AEPB Inhale 1 puff into the lungs daily. 03/11/22   Kalman Shan, MD  Fluticasone-Umeclidin-Vilant (TRELEGY ELLIPTA) 200-62.5-25 MCG/ACT AEPB Inhale 1 puff into the lungs daily. 06/04/22   Ferol Luz, MD  ipratropium (ATROVENT) 0.06 % nasal spray 1 spray each nostril up to 3 times daily as needed for runny nose. 07/20/22   Ferol Luz, MD  levonorgestrel (MIRENA, 52 MG,) 20 MCG/DAY IUD by Intrauterine route.    [provider]  meloxicam (MOBIC) 15 MG tablet Take 1 tablet (15 mg total) by mouth daily. 06/22/23 10/20/23  Felecia Shelling, DPM  montelukast (SINGULAIR) 10 MG tablet Take 10 mg by mouth as needed. 03/11/22   [provider]  naphazoline-pheniramine (NAPHCON-A) 0.025-0.3 % ophthalmic solution Place 1 drop into both eyes 4 (four) times daily as needed. 12/31/22   [provider]  ofloxacin (OCUFLOX) 0.3 % ophthalmic solution Place 1 drop into both eyes 4 (four) times daily. 12/31/22   [provider]  Olopatadine-Mometasone (Cristal Generous) [phone] MCG/ACT SUSP Place 1 spray into the nose daily. 07/02/22   Ferol Luz, MD  omeprazole (PRILOSEC) 40 MG capsule Take 1 capsule (40 mg total) by mouth daily. First thing in the morning on an empty stomach 02/18/22   Causey, Larna Daughters, NP  pimecrolimus (ELIDEL) 1 % cream Apply topically 2 (two) times daily. 07/08/23   Ferol Luz, MD  prednisoLONE acetate (PRED FORTE) 1 % ophthalmic suspension Apply to eye. 05/10/23  [provider]  rizatriptan (MAXALT) 10 MG tablet Take 1 tablet (10 mg total) by mouth as needed for migraine. May repeat in 2 hours if needed 08/30/23   Lomax, Amy, NP  tacrolimus (PROTOPIC) 0.1 % ointment Apply topically 2 (two) times daily. 04/15/23   Ferol Luz, MD      Allergies    Sulfa antibiotics    Review of Systems   Review of Systems  Constitutional:  Negative for fatigue and fever.  Respiratory:  Negative for  cough and shortness of breath.   Cardiovascular:  Positive for chest pain. Negative for palpitations and leg swelling.  Gastrointestinal:  Negative for abdominal pain, diarrhea and vomiting.  Genitourinary:  Negative for flank pain.  Musculoskeletal:  Negative for back pain.  Skin:  Negative for rash.  Neurological:  Negative for headaches.    Physical Exam Updated Vital Signs BP (!) 145/98 (BP Location: Left Arm)   Pulse 84   Temp 98 F (36.7 C) (Oral)   Resp 18   Wt 90 kg   SpO2 100%   BMI 36.29 kg/m  Physical Exam Vitals and nursing note reviewed.  Constitutional:      General: She is not in acute distress.    Appearance: Normal appearance. She is not ill-appearing.  HENT:     Head: Normocephalic.     Mouth/Throat:     Mouth: Mucous membranes are moist.  Cardiovascular:     Rate and Rhythm: Normal rate.  Pulmonary:     Effort: Pulmonary effort is normal. No respiratory distress.  Abdominal:     Palpations: Abdomen is soft.     Tenderness: There is no abdominal tenderness.  Musculoskeletal:     Comments: Minimally reproducible pain to deep palpation along the ribs underneath the right breast, no crepitus  Skin:    General: Skin is warm.     Comments: No rash underneath the right breast  Neurological:     Mental Status: She is alert and oriented to person, place, and time. Mental status is at baseline.  Psychiatric:        Mood and Affect: Mood normal.     ED Results / Procedures / Treatments   Labs (all labs ordered are listed, but only abnormal results are displayed) Labs Reviewed  CBC WITH DIFFERENTIAL/PLATELET  BASIC METABOLIC PANEL  HCG, SERUM, QUALITATIVE  PREGNANCY, URINE  TROPONIN I (HIGH SENSITIVITY)    EKG None  Radiology No results found.  Procedures Procedures    Medications Ordered in ED Medications - No data to display  ED Course/ Medical Decision Making/ A&P                                 Medical Decision Making Amount  and/or Complexity of Data Reviewed Labs: ordered. Radiology: ordered.  Risk Prescription drug management.   37 year old female presents emergency department for right sided chest pain along the ribs underneath her right breast.  Past medical history of breast cancer s/p surgery/chemo.  Vitals are normal and stable on arrival.  Pain is not reproducible, no overlying rash of the skin.  Pain is pleuritic, worse with deep inspiration.  EKG shows no ischemic changes, blood work is reassuring, troponin is negative.  Chest x-ray showing an enlarged cardiac silhouette, concern for possible pericardial effusion.  Given history of cancer and this new finding a CT PE study was pursued.  This shows no blood clot, no pericardial  effusion.  Shows stable mediastinal lymphadenopathy with no other acute findings.  On reevaluation patient feels slightly improved after dose of Toradol.  Will plan for outpatient follow-up.  Patient at this time appears safe and stable for discharge and close outpatient follow up. Discharge plan and strict return to ED precautions discussed, patient verbalizes understanding and agreement.        Final Clinical Impression(s) / ED Diagnoses Final diagnoses:  None    Rx / DC Orders ED Discharge Orders     None         Rozelle Logan, DO 10/09/23 1952

## 2023-10-09 NOTE — Discharge Instructions (Addendum)
You have been seen and discharged from the emergency department.  Your blood work was normal, your heart enzyme was normal.  The chest x-ray showed questionable findings on the right side where your pain was.  For this reason a CAT scan was done.  This CAT scan showed stable/unchanged lymph nodes, post surgical findings but no blood clot or other emergent finding.  You have been prescribed pain medicine for your chest pain.  Do not mix this medication with alcohol or other sedating medications. Do not drive or do heavy physical activity until you know how this medication affects you.  It may cause drowsiness.  Follow-up with your primary provider for further evaluation and further care. Take home medications as prescribed. If you have any worsening symptoms or further concerns for your health please return to an emergency department for further evaluation.

## 2023-10-09 NOTE — ED Notes (Signed)
Pt refused NT to take vital signs; says she's been here all day & is ready to go home

## 2023-10-09 NOTE — ED Triage Notes (Addendum)
C/o pain under right breast that worsens with inhalation, laying on right side, and palpitation x3 days Denies sob.

## 2023-10-21 ENCOUNTER — Inpatient Hospital Stay: Payer: 59 | Attending: Hematology and Oncology | Admitting: Hematology and Oncology

## 2023-10-21 ENCOUNTER — Ambulatory Visit: Payer: 59 | Admitting: *Deleted

## 2023-10-21 VITALS — BP 142/87 | HR 72 | Temp 98.2°F | Resp 18 | Wt 196.6 lb

## 2023-10-21 DIAGNOSIS — Z8042 Family history of malignant neoplasm of prostate: Secondary | ICD-10-CM | POA: Insufficient documentation

## 2023-10-21 DIAGNOSIS — C50412 Malignant neoplasm of upper-outer quadrant of left female breast: Secondary | ICD-10-CM | POA: Diagnosis not present

## 2023-10-21 DIAGNOSIS — Z8041 Family history of malignant neoplasm of ovary: Secondary | ICD-10-CM | POA: Diagnosis not present

## 2023-10-21 DIAGNOSIS — J4550 Severe persistent asthma, uncomplicated: Secondary | ICD-10-CM | POA: Diagnosis not present

## 2023-10-21 DIAGNOSIS — Z171 Estrogen receptor negative status [ER-]: Secondary | ICD-10-CM

## 2023-10-21 DIAGNOSIS — Z853 Personal history of malignant neoplasm of breast: Secondary | ICD-10-CM | POA: Insufficient documentation

## 2023-10-21 DIAGNOSIS — Z9221 Personal history of antineoplastic chemotherapy: Secondary | ICD-10-CM | POA: Insufficient documentation

## 2023-10-21 DIAGNOSIS — D869 Sarcoidosis, unspecified: Secondary | ICD-10-CM | POA: Insufficient documentation

## 2023-10-21 DIAGNOSIS — Z923 Personal history of irradiation: Secondary | ICD-10-CM | POA: Insufficient documentation

## 2023-10-21 DIAGNOSIS — R079 Chest pain, unspecified: Secondary | ICD-10-CM | POA: Insufficient documentation

## 2023-10-21 NOTE — Assessment & Plan Note (Addendum)
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 She is here for surveillance. There is no concern for disease recurrence. Last mammogram neg. Repeat mammogram in June 25, ordered.  Chest Pain Recent episode of chest pain, evaluated in the ER with chest X-ray and CT scan, both of which were unremarkable. Pain improved with ibuprofen and Tylenol. Possible costochondritis given the location and nature of the pain, and improvement with NSAIDs. -No further action required at this time as pain has resolved.  Sarcoidosis History of sarcoidosis, but no current symptoms or issues reported. -Continue current management.  Weight Management Patient had been referred for weight management, but has not received a call from the service. -Provide patient with contact information for weight management service and encourage patient to follow up.  Breast Health Mammogram results were good. -Schedule next mammogram for June 2025.  Follow-up No new medications or other issues reported. -Schedule follow-up appointment for six months.

## 2023-10-21 NOTE — Progress Notes (Signed)
Jeanne Graff, MD [address] [place] [address]   DIAGNOSIS:  Cancer Staging  Malignant neoplasm of upper-outer quadrant of left breast in female, estrogen receptor negative (HCC) Staging form: Breast, AJCC 8th Edition - Clinical stage from 06/04/2020: Stage IIB (cT2, cN0(f), cM0, G3, ER-, PR-, HER2-) - Unsigned Stage prefix: Initial diagnosis Method of lymph node assessment: Core biopsy Histologic grading system: 3 grade system   SUMMARY OF ONCOLOGIC HISTORY:  37 y.o.  woman status post left breast upper outer quadrant biopsy 05/29/2020 for a clinical T2 N0, stage IIB invasive ductal carcinoma, functionally triple negative, with an MIB-1 of 95%.   (1) genetics testing 06/23/2020 through the [facility] Multi-Cancer Panel found no deleterious mutations in AIP, ALK, APC, ATM, AXIN2,BAP1,  BARD1, BLM, BMPR1A, BRCA1, BRCA2, BRIP1, CASR, CDC73, CDH1, CDK4, CDKN1B, CDKN1C, CDKN2A (p14ARF), CDKN2A (p16INK4a), CEBPA, CHEK2, CTNNA1, DICER1, DIS3L2, EGFR (c.2369C>T, p.Thr790Met variant only), EPCAM (Deletion/duplication testing only), FH, FLCN, GATA2, GPC3, GREM1 (Promoter region deletion/duplication testing only), HOXB13 (c.251G>A, p.Gly84Glu), HRAS, KIT, MAX, MEN1, MET, MITF (c.952G>A, p.Glu318Lys variant only), MLH1, MSH2, MSH3, MSH6, MUTYH, NBN, NF1, NF2, NTHL1, PALB2, PDGFRA, PHOX2B, PMS2, POLD1, POLE, POT1, PRKAR1A, PTCH1, PTEN, RAD50, RAD51C, RAD51D, RB1, RECQL4, RET, RNF43, RUNX1, SDHAF2, SDHA (sequence changes only), SDHB, SDHC, SDHD, SMAD4, SMARCA4, SMARCB1, SMARCE1, STK11, SUFU, TERC, TERT, TMEM127, TP53, TSC1, TSC2, VHL, WRN and WT1.    (2) neoadjuvant chemotherapy consisting of cyclophosphamide and doxorubicin in dose dense fashion x4 started 06/19/2020, completed 08/05/2020, followed by paclitaxel and carboplatin weekly x12 starting 08/26/2020, completing 4 doses 09/23/2020             (a) echocardiogram on 06/13/2020 showed an EF of 60-65%             (b)  paclitaxel and carboplatin discontinued after 4 doses because of neuropathy and cytopenias   (3) status post left lumpectomy and sentinel lymph node sampling 11/19/2020 for a residual yp T1c ypN0 invasive ductal carcinoma involving the superior margin             (a) additional surgery 12/09/2020 cleared the margin in question             (b) repeat prognostic panel confirms triple negative disease             (c) status post left mastopexy and right breast reduction on 10/27/2021 with fat grafting bilaterally   (4) adjuvant radiation : 01/22/2021 through 03/10/2021 Site Technique Total Dose (Gy) Dose per Fx (Gy) Completed Fx Beam Energies  Breast, Left: Breast_Lt 3D 50.4/50.4 1.8 28/28 6X  Breast, Left: Breast_Lt_Bst 3D 10/10 2 5/5 6X              (a) received capecitabine sensitization, started 01/26/2021   (5) pembrolizumab/Keytruda began on 04/16/2021, held 05/11/2021 (after 2 doses) with persistent cough, later attributed to asthma (A) pembrolizumab resumed 08/06/2021 (B) PD-L1 combined score is 10   (6) molecular pathology from the 11/19/2020 sample 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  There were no mutations in BRCA1 or 2, ERBB2 or PIK3CA.  The microsatellite status was equivocal and the tumor mutational burden could not be determined on the sample.   (7) sarcoidosis? (followed by pulmonary: Ramaswamy)             (A) chest CT 06/13/2020 shows in addition to the left breast mass multiple ill-defined pulmonary nodules, pleural nodularity and symmetrical by hilar and mediastinal adenopathy consistent with sarcoidosis. (B) chest CT 05/29/2021 shows stable mediastinal and hilar  adenopathy as well as improved liver lesions.  No evidence of metastatic disease. (C) CT angiogram of the chest on February 02, 2022 shows no evidence of pulmonary embolus and improvement in possible sarcoidosis.  No progressive or new nodularity.  Bronchial wall thickening in the lower lobes which can be seen with bronchitis or  reactive airway disease.  CURRENT THERAPY: observation  INTERVAL HISTORY:  Jeanne Evans 37 y.o. female returns for follow up.    The patient, with a history of breast cancer, sarcoidosis and uveitis, presents with a recent episode of chest pain. The pain, located under the breasts on the bone, lasted for four days and was associated with sharp pain during deep breaths. The patient sought emergency care, where chest x-rays and a CT scan were performed. The patient was given ibuprofen, which helped to alleviate the pain, and was discharged with tramadol, which she did not take. Instead, she managed the pain with Tylenol and ibuprofen until it subsided.  The patient also reports a previous episode of uveitis, which resulted in temporary vision loss. The vision has since returned to normal. She also mentions discomfort in the arm where lymph nodes were previously removed, which she manages with a compression bra and lymphatic exercises.  Rest of the pertinent 10 point ROS reviewed and neg.  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   Lichen planus 01/21/2023   Gastroesophageal reflux disease without esophagitis 01/21/2023   Other allergic rhinitis 01/21/2023   Port-A-Cath in place 03/11/2022   Acute respiratory failure with hypoxemia (HCC) 02/03/2022   Acute respiratory failure with hypoxia (HCC) 02/03/2022   Asthma 07/31/2021   Lymphedema of left arm 02/02/2021   Drug-induced neutropenia (HCC) 09/30/2020   Genetic testing 06/26/2020   Family history of ovarian cancer    Family history of prostate cancer    Malignant neoplasm of upper-outer quadrant of left breast in female, estrogen receptor negative (HCC) 06/03/2020    is allergic to sulfa antibiotics.  MEDICAL HISTORY: Past Medical History:  Diagnosis Date    Asthma 07/31/2021   Breast cancer (HCC)    Family history of ovarian cancer    Family history of prostate cancer    GERD (gastroesophageal reflux disease)    History of asthma    has albuterol, uses PRN   History of heart murmur in childhood    History of multiple allergies     SURGICAL HISTORY: Past Surgical History:  Procedure Laterality Date   BREAST LUMPECTOMY WITH RADIOACTIVE SEED AND SENTINEL LYMPH NODE BIOPSY Left 11/19/2020   Procedure: LEFT BREAST LUMPECTOMY X 2 WITH RADIOACTIVE SEED AND SENTINEL LYMPH NODE MAPPING;  Surgeon: Harriette Bouillon, MD;  Location: [facility];  Service: General;  Laterality: Left;  PECTORAL BLOCK   BREAST REDUCTION SURGERY Right 10/27/2021   Procedure: MAMMARY REDUCTION  TO RIGHT BREAST;  Surgeon: Allena Napoleon, MD;  Location: [facility];  Service: Plastics;  Laterality: Right;   LIPOSUCTION WITH LIPOFILLING Bilateral 10/27/2021   Procedure: BILATERAL FAT GRAFTING TO BREAST;  Surgeon: Allena Napoleon, MD;  Location: [facility];  Service: Plastics;  Laterality: Bilateral;   PORTACATH PLACEMENT Right 06/18/2020   Procedure: INSERTION PORT-A-CATH WITH ULTRASOUND GUIDANCE;  Surgeon: Harriette Bouillon, MD;  Location: [facility];  Service: General;  Laterality: Right;   RE-EXCISION  OF BREAST LUMPECTOMY Left 12/09/2020   Procedure: RE-EXCISION OF LEFT BREAST LUMPECTOMY;  Surgeon: Harriette Bouillon, MD;  Location: [facility];  Service: General;  Laterality: Left;    SOCIAL HISTORY: Social History   Socioeconomic History   Marital status: Single    Spouse name: Not on file   Number of children: 2   Years of education: 12   Highest education level: Not on file  Occupational History   Not on file  Tobacco Use   Smoking status: Never    Passive exposure: Never   Smokeless tobacco: Never  Vaping Use   Vaping status: Never Used  Substance and Sexual Activity   Alcohol use: Yes     Comment: socially   Drug use: Yes    Types: Marijuana    Comment: occass   Sexual activity: Not on file  Other Topics Concern   Not on file  Social History Narrative   Right handed   Caffeine use: drinks about 1 cup coffee every morning (5 days per week)   Social Determinants of Health   Financial Resource Strain: High Risk (09/18/2021)   Overall Financial Resource Strain (CARDIA)    Difficulty of Paying Living Expenses: Very hard  Food Insecurity: Food Insecurity Present (09/18/2021)   Hunger Vital Sign    Worried About Running Out of Food in the Last Year: Sometimes true    Ran Out of Food in the Last Year: Sometimes true  Transportation Needs: No Transportation Needs (09/18/2021)   PRAPARE - Administrator, Civil Service (Medical): No    Lack of Transportation (Non-Medical): No  Physical Activity: Not on file  Stress: Stress Concern Present (09/18/2021)   [employer] of Occupational Health - Occupational Stress Questionnaire    Feeling of Stress : Very much  Social Connections: Unknown (12/31/2022)   Received from [facility], [facility]   Social Network    Social Network: Not on file  Intimate Partner Violence: Unknown (12/31/2022)   Received from [facility], [facility]   HITS    Physically Hurt: Not on file    Insult or Talk Down To: Not on file    Threaten Physical Harm: Not on file    Scream or Curse: Not on file    FAMILY HISTORY: Family History  Problem Relation Age of Onset   Asthma Mother    High blood pressure Mother    High blood pressure Father    High Cholesterol Father    Asthma Brother    Ovarian cancer Paternal Grandmother        dx 60s   Prostate cancer Paternal Grandfather        dx 11s   Lung cancer Maternal Uncle        dx late 65s   Prostate cancer Paternal Uncle        dx late 60s   Prostate cancer Paternal Uncle        dx early 42s    Review of Systems  Constitutional:  Positive for fatigue. Negative for  appetite change, chills, fever and unexpected weight change.  HENT:   Negative for hearing loss, lump/mass, mouth sores, sore throat and trouble swallowing.   Eyes:  Negative for eye problems and icterus.  Respiratory:  Negative for chest tightness, cough and shortness of breath.   Cardiovascular:  Negative for chest pain, leg swelling and palpitations.  Gastrointestinal:  Negative for abdominal distention, abdominal pain, constipation, diarrhea, nausea and vomiting.  Endocrine:  Negative for hot flashes.  Genitourinary:  Negative for difficulty urinating.   Musculoskeletal:  Negative for arthralgias.  Skin:  Negative for itching and rash.  Neurological:  Negative for dizziness, extremity weakness, headaches and numbness.  Hematological:  Negative for adenopathy. Does not bruise/bleed easily.  Psychiatric/Behavioral:  Negative for depression. The patient is not nervous/anxious.       PHYSICAL EXAMINATION  ECOG PERFORMANCE STATUS: 1 - Symptomatic but completely ambulatory  Vitals:   10/21/23 0945  BP: (!) 142/87  Pulse: 72  Resp: 18  Temp: 98.2 F (36.8 C)  SpO2: 100%   Physical Exam Constitutional:      Appearance: Normal appearance.  HENT:     Head: Normocephalic and atraumatic.  Cardiovascular:     Rate and Rhythm: Normal rate and regular rhythm.     Pulses: Normal pulses.     Heart sounds: Normal heart sounds.  Pulmonary:     Effort: Pulmonary effort is normal.     Breath sounds: Normal breath sounds.  Chest:     Comments: Bilateral breast examined.  No palpable masses or regional adenopathy Musculoskeletal:        General: No swelling.     Cervical back: No rigidity.  Lymphadenopathy:     Cervical: No cervical adenopathy.  Skin:    General: Skin is warm and dry.  Neurological:     Mental Status: She is alert.     LABORATORY DATA:  CBC    Component Value Date/Time   WBC 4.5 10/09/2023 1359   RBC 4.62 10/09/2023 1359   HGB 13.6 10/09/2023 1359   HGB  12.6 06/04/2022 0928   HCT 40.1 10/09/2023 1359   HCT 40.1 06/04/2022 0928   PLT 262 10/09/2023 1359   PLT 290 06/04/2022 0928   MCV 86.8 10/09/2023 1359   MCV 88 06/04/2022 0928   MCH 29.4 10/09/2023 1359   MCHC 33.9 10/09/2023 1359   RDW 14.5 10/09/2023 1359   RDW 14.5 06/04/2022 0928   LYMPHSABS 2.0 10/09/2023 1359   LYMPHSABS 2.3 06/04/2022 0928   MONOABS 0.4 10/09/2023 1359   EOSABS 0.1 10/09/2023 1359   EOSABS 0.5 (H) 06/04/2022 0928   BASOSABS 0.0 10/09/2023 1359   BASOSABS 0.0 06/04/2022 0928    CMP     Component Value Date/Time   NA 134 (L) 10/09/2023 1359   K 3.7 10/09/2023 1359   CL 101 10/09/2023 1359   CO2 24 10/09/2023 1359   GLUCOSE 91 10/09/2023 1359   BUN 11 10/09/2023 1359   CREATININE 0.80 10/09/2023 1359   CREATININE 1.03 (H) 10/14/2021 1356   CALCIUM 9.2 10/09/2023 1359   PROT 7.7 05/20/2022 1252   ALBUMIN 4.5 05/20/2022 1252   AST 18 05/20/2022 1252   AST 18 10/14/2021 1356   ALT 21 05/20/2022 1252   ALT 21 10/14/2021 1356   ALKPHOS 82 05/20/2022 1252   BILITOT 0.5 05/20/2022 1252   BILITOT 0.8 10/14/2021 1356   GFRNONAA >60 10/09/2023 1359   GFRNONAA >60 10/14/2021 1356   GFRAA >60 08/05/2020 0955   GFRAA >60 06/04/2020 0830    ASSESSMENT and THERAPY PLAN:   Malignant neoplasm of upper-outer quadrant of left breast in female, estrogen receptor negative (HCC) Ms. 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 She is here for surveillance. There is no concern for disease recurrence. Last mammogram neg. Repeat mammogram in June 25, ordered.  Chest Pain Recent episode of chest pain, evaluated in the ER with chest X-ray and CT scan, both of which were unremarkable. Pain improved with ibuprofen and Tylenol. Possible costochondritis given the location and nature of the pain, and  improvement with NSAIDs. -No further action required at this time as pain has resolved.  Sarcoidosis History of sarcoidosis, but no current symptoms or issues reported. -Continue current management.  Weight Management Patient had been referred for weight management, but has not received a call from the service. -Provide patient with contact information for weight management service and encourage patient to follow up.  Breast Health Mammogram results were good. -Schedule next mammogram for June 2025.  Follow-up No new medications or other issues reported. -Schedule follow-up appointment for six months.   Total time spent: 30 min  *Total Encounter Time as defined by the Centers for Medicare and Medicaid Services includes, in addition to the face-to-face time of a patient visit (documented in the note above) non-face-to-face time: obtaining and reviewing outside history, ordering and reviewing medications, tests or procedures, care coordination (communications with other health care professionals or caregivers) and documentation in the medical record.

## 2023-10-28 ENCOUNTER — Encounter: Payer: Self-pay | Admitting: *Deleted

## 2023-11-04 ENCOUNTER — Ambulatory Visit (INDEPENDENT_AMBULATORY_CARE_PROVIDER_SITE_OTHER): Payer: 59

## 2023-11-04 DIAGNOSIS — J4550 Severe persistent asthma, uncomplicated: Secondary | ICD-10-CM | POA: Diagnosis not present

## 2023-11-22 ENCOUNTER — Ambulatory Visit (INDEPENDENT_AMBULATORY_CARE_PROVIDER_SITE_OTHER): Payer: 59 | Admitting: *Deleted

## 2023-11-22 DIAGNOSIS — J4550 Severe persistent asthma, uncomplicated: Secondary | ICD-10-CM | POA: Diagnosis not present

## 2023-12-06 ENCOUNTER — Ambulatory Visit: Payer: 59 | Admitting: *Deleted

## 2023-12-06 DIAGNOSIS — J4550 Severe persistent asthma, uncomplicated: Secondary | ICD-10-CM

## 2023-12-20 ENCOUNTER — Ambulatory Visit: Payer: 59

## 2023-12-20 DIAGNOSIS — J4550 Severe persistent asthma, uncomplicated: Secondary | ICD-10-CM | POA: Diagnosis not present

## 2024-01-03 ENCOUNTER — Ambulatory Visit: Payer: 59 | Admitting: *Deleted

## 2024-01-03 DIAGNOSIS — J4550 Severe persistent asthma, uncomplicated: Secondary | ICD-10-CM

## 2024-01-05 ENCOUNTER — Telehealth: Payer: Self-pay

## 2024-01-05 NOTE — Telephone Encounter (Signed)
Spoke with patient to confirm appointment on 01/06/24

## 2024-01-06 ENCOUNTER — Inpatient Hospital Stay: Payer: 59 | Attending: Hematology and Oncology | Admitting: Hematology and Oncology

## 2024-01-06 ENCOUNTER — Encounter: Payer: Self-pay | Admitting: Hematology and Oncology

## 2024-01-06 VITALS — BP 148/93 | HR 69 | Temp 98.3°F | Resp 18 | Wt 200.6 lb

## 2024-01-06 DIAGNOSIS — H538 Other visual disturbances: Secondary | ICD-10-CM | POA: Diagnosis not present

## 2024-01-06 DIAGNOSIS — Z853 Personal history of malignant neoplasm of breast: Secondary | ICD-10-CM | POA: Insufficient documentation

## 2024-01-06 DIAGNOSIS — Z8041 Family history of malignant neoplasm of ovary: Secondary | ICD-10-CM | POA: Diagnosis not present

## 2024-01-06 DIAGNOSIS — Z9221 Personal history of antineoplastic chemotherapy: Secondary | ICD-10-CM | POA: Diagnosis not present

## 2024-01-06 DIAGNOSIS — Z171 Estrogen receptor negative status [ER-]: Secondary | ICD-10-CM | POA: Diagnosis not present

## 2024-01-06 DIAGNOSIS — Z801 Family history of malignant neoplasm of trachea, bronchus and lung: Secondary | ICD-10-CM | POA: Diagnosis not present

## 2024-01-06 DIAGNOSIS — E669 Obesity, unspecified: Secondary | ICD-10-CM | POA: Insufficient documentation

## 2024-01-06 DIAGNOSIS — Z8042 Family history of malignant neoplasm of prostate: Secondary | ICD-10-CM | POA: Insufficient documentation

## 2024-01-06 DIAGNOSIS — H209 Unspecified iridocyclitis: Secondary | ICD-10-CM | POA: Insufficient documentation

## 2024-01-06 DIAGNOSIS — Z923 Personal history of irradiation: Secondary | ICD-10-CM | POA: Insufficient documentation

## 2024-01-06 DIAGNOSIS — C50412 Malignant neoplasm of upper-outer quadrant of left female breast: Secondary | ICD-10-CM | POA: Diagnosis not present

## 2024-01-06 NOTE — Progress Notes (Signed)
[name], MD [address] [place] [address]   DIAGNOSIS:  Cancer Staging  Malignant neoplasm of upper-outer quadrant of left breast in female, estrogen receptor negative (HCC) Staging form: Breast, AJCC 8th Edition - Clinical stage from 06/04/2020: Stage IIB (cT2, cN0(f), cM0, G3, ER-, PR-, HER2-) - Signed by [name], MD on 01/06/2024 Stage prefix: Initial diagnosis Method of lymph node assessment: Core biopsy Histologic grading system: 3 grade system   SUMMARY OF ONCOLOGIC HISTORY:  38 y.o. [place] woman status post left breast upper outer quadrant biopsy 05/29/2020 for a clinical T2 N0, stage IIB invasive ductal carcinoma, functionally triple negative, with an MIB-1 of 95%.   (1) genetics testing 06/23/2020 through the [facility] Multi-Cancer Panel found no deleterious mutations in AIP, ALK, APC, ATM, AXIN2,BAP1,  BARD1, BLM, BMPR1A, BRCA1, BRCA2, BRIP1, CASR, CDC73, CDH1, CDK4, CDKN1B, CDKN1C, CDKN2A (p14ARF), CDKN2A (p16INK4a), CEBPA, CHEK2, CTNNA1, DICER1, DIS3L2, EGFR (c.2369C>T, p.Thr790Met variant only), EPCAM (Deletion/duplication testing only), FH, FLCN, GATA2, GPC3, GREM1 (Promoter region deletion/duplication testing only), HOXB13 (c.251G>A, p.Gly84Glu), HRAS, KIT, MAX, MEN1, MET, MITF (c.952G>A, p.Glu318Lys variant only), MLH1, MSH2, MSH3, MSH6, MUTYH, NBN, NF1, NF2, NTHL1, PALB2, PDGFRA, PHOX2B, PMS2, POLD1, POLE, POT1, PRKAR1A, PTCH1, PTEN, RAD50, RAD51C, RAD51D, RB1, RECQL4, RET, RNF43, RUNX1, SDHAF2, SDHA (sequence changes only), SDHB, SDHC, SDHD, SMAD4, SMARCA4, SMARCB1, SMARCE1, STK11, SUFU, TERC, TERT, TMEM127, TP53, TSC1, TSC2, VHL, WRN and WT1.    (2) neoadjuvant chemotherapy consisting of cyclophosphamide and doxorubicin in dose dense fashion x4 started 06/19/2020, completed 08/05/2020, followed by paclitaxel and carboplatin weekly x12 starting 08/26/2020, completing 4 doses 09/23/2020             (a) echocardiogram on 06/13/2020 showed an EF  of 60-65%             (b) paclitaxel and carboplatin discontinued after 4 doses because of neuropathy and cytopenias   (3) status post left lumpectomy and sentinel lymph node sampling 11/19/2020 for a residual yp T1c ypN0 invasive ductal carcinoma involving the superior margin             (a) additional surgery 12/09/2020 cleared the margin in question             (b) repeat prognostic panel confirms triple negative disease             (c) status post left mastopexy and right breast reduction on 10/27/2021 with fat grafting bilaterally   (4) adjuvant radiation : 01/22/2021 through 03/10/2021 Site Technique Total Dose (Gy) Dose per Fx (Gy) Completed Fx Beam Energies  Breast, Left: Breast_Lt 3D 50.4/50.4 1.8 28/28 6X  Breast, Left: Breast_Lt_Bst 3D 10/10 2 5/5 6X              (a) received capecitabine sensitization, started 01/26/2021   (5) pembrolizumab/Keytruda began on 04/16/2021, held 05/11/2021 (after 2 doses) with persistent cough, later attributed to asthma (A) pembrolizumab resumed 08/06/2021 (B) PD-L1 combined score is 10   (6) molecular pathology from the 11/19/2020 sample 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  There were no mutations in BRCA1 or 2, ERBB2 or PIK3CA.  The microsatellite status was equivocal and the tumor mutational burden could not be determined on the sample.   (7) sarcoidosis? (followed by pulmonary: Ramaswamy)             (A) chest CT 06/13/2020 shows in addition to the left breast mass multiple ill-defined pulmonary nodules, pleural nodularity and symmetrical by hilar and mediastinal adenopathy consistent with sarcoidosis. (B) chest CT  05/29/2021 shows stable mediastinal and hilar adenopathy as well as improved liver lesions.  No evidence of metastatic disease. (C) CT angiogram of the chest on February 02, 2022 shows no evidence of pulmonary embolus and improvement in possible sarcoidosis.  No progressive or new nodularity.  Bronchial wall thickening in the lower lobes which can  be seen with bronchitis or reactive airway disease.  CURRENT THERAPY: observation  INTERVAL HISTORY:  [name] 38 y.o. female returns for follow up.     Discussed the use of AI scribe software for clinical note transcription with the patient, who gave verbal consent to proceed.  History of Present Illness    Jeanne Evans is a 38 year old female with breast cancer, sarcoidosis and uveitis who presents with ongoing eye problems.  She has ongoing issues with uveitis, leading to significant vision problems. Her vision is described as 'real bad', with difficulty seeing things up close, such as her phone, due to blurriness. She is using steroid eye drops, which she believes contribute to the blurriness.  Her sarcoidosis is currently inactive, but she is experiencing eye problems related to the condition. She has been put back on steroids, which complicates her metabolism and weight loss efforts.  She is facing challenges with weight management, having previously lost weight but regained some. She has started a workout routine on Tuesdays and Thursdays, focusing on treadmill and bicycle exercises. She is attempting to monitor her calorie intake, eating mainly at lunchtime and avoiding food after 8 PM, though she sometimes snacks. She is trying to create a calorie deficit to aid in weight loss.  She confirms adherence to her current medication regimen and has not seen any other doctors aside from her eye specialist.  No changes in bowel habits, urination, or breathing difficulties.   Rest of the pertinent 10 point ROS reviewed and neg.  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   Lichen planus 01/21/2023   Gastroesophageal reflux disease without esophagitis 01/21/2023   Other allergic rhinitis 01/21/2023    Port-A-Cath in place 03/11/2022   Acute respiratory failure with hypoxemia (HCC) 02/03/2022   Acute respiratory failure with hypoxia (HCC) 02/03/2022   Asthma 07/31/2021   Lymphedema of left arm 02/02/2021   Drug-induced neutropenia (HCC) 09/30/2020   Genetic testing 06/26/2020   Family history of ovarian cancer    Family history of prostate cancer    Malignant neoplasm of upper-outer quadrant of left breast in female, estrogen receptor negative (HCC) 06/03/2020    is allergic to sulfa antibiotics.  MEDICAL HISTORY: Past Medical History:  Diagnosis Date   Asthma 07/31/2021   Breast cancer (HCC)    Family history of ovarian cancer    Family history of prostate cancer    GERD (gastroesophageal reflux disease)    History of asthma    has albuterol, uses PRN   History of heart murmur in childhood    History of multiple allergies     SURGICAL HISTORY: Past Surgical History:  Procedure Laterality Date   BREAST LUMPECTOMY WITH RADIOACTIVE SEED AND SENTINEL LYMPH NODE BIOPSY Left 11/19/2020   Procedure: LEFT BREAST LUMPECTOMY X 2 WITH RADIOACTIVE SEED AND SENTINEL LYMPH NODE MAPPING;  Surgeon: Harriette Bouillon, MD;  Location: [facility];  Service: General;  Laterality: Left;  PECTORAL BLOCK   BREAST REDUCTION SURGERY  Right 10/27/2021   Procedure: MAMMARY REDUCTION  TO RIGHT BREAST;  Surgeon: Allena Napoleon, MD;  Location: [facility];  Service: Plastics;  Laterality: Right;   LIPOSUCTION WITH LIPOFILLING Bilateral 10/27/2021   Procedure: BILATERAL FAT GRAFTING TO BREAST;  Surgeon: Allena Napoleon, MD;  Location: [facility];  Service: Plastics;  Laterality: Bilateral;   PORTACATH PLACEMENT Right 06/18/2020   Procedure: INSERTION PORT-A-CATH WITH ULTRASOUND GUIDANCE;  Surgeon: Harriette Bouillon, MD;  Location: [facility];  Service: General;  Laterality: Right;   RE-EXCISION OF BREAST LUMPECTOMY Left 12/09/2020   Procedure: RE-EXCISION  OF LEFT BREAST LUMPECTOMY;  Surgeon: Harriette Bouillon, MD;  Location: [facility];  Service: General;  Laterality: Left;    SOCIAL HISTORY: Social History   Socioeconomic History   Marital status: Single    Spouse name: Not on file   Number of children: 2   Years of education: 12   Highest education level: Not on file  Occupational History   Not on file  Tobacco Use   Smoking status: Never    Passive exposure: Never   Smokeless tobacco: Never  Vaping Use   Vaping status: Never Used  Substance and Sexual Activity   Alcohol use: Yes    Comment: socially   Drug use: Yes    Types: Marijuana    Comment: occass   Sexual activity: Not on file  Other Topics Concern   Not on file  Social History Narrative   Right handed   Caffeine use: drinks about 1 cup coffee every morning (5 days per week)   Social Drivers of Health   Financial Resource Strain: High Risk (09/18/2021)   Overall Financial Resource Strain (CARDIA)    Difficulty of Paying Living Expenses: Very hard  Food Insecurity: Food Insecurity Present (09/18/2021)   Hunger Vital Sign    Worried About Running Out of Food in the Last Year: Sometimes true    Ran Out of Food in the Last Year: Sometimes true  Transportation Needs: No Transportation Needs (09/18/2021)   PRAPARE - Administrator, Civil Service (Medical): No    Lack of Transportation (Non-Medical): No  Physical Activity: Not on file  Stress: Stress Concern Present (09/18/2021)   [employer] of Occupational Health - Occupational Stress Questionnaire    Feeling of Stress : Very much  Social Connections: Unknown (12/31/2022)   Received from [facility]   Social Network    Social Network: Not on file  Intimate Partner Violence: Unknown (12/31/2022)   [facility]   HITS    Physically Hurt: Not on file    Insult or Talk Down To: Not on file    Threaten Physical Harm: Not on file    Scream  or Curse: Not on file    FAMILY HISTORY: Family History  Problem Relation Age of Onset   Asthma Mother    High blood pressure Mother    High blood pressure Father    High Cholesterol Father    Asthma Brother    Ovarian cancer Paternal Grandmother        dx 40s   Prostate cancer Paternal Grandfather        dx 63s   Lung cancer Maternal Uncle        dx late 58s   Prostate cancer Paternal Uncle        dx late 60s   Prostate cancer Paternal Uncle  dx early 26s    Review of Systems  Constitutional:  Positive for fatigue. Negative for appetite change, chills, fever and unexpected weight change.  HENT:   Negative for hearing loss, lump/mass, mouth sores, sore throat and trouble swallowing.   Eyes:  Negative for eye problems and icterus.  Respiratory:  Negative for chest tightness, cough and shortness of breath.   Cardiovascular:  Negative for chest pain, leg swelling and palpitations.  Gastrointestinal:  Negative for abdominal distention, abdominal pain, constipation, diarrhea, nausea and vomiting.  Endocrine: Negative for hot flashes.  Genitourinary:  Negative for difficulty urinating.   Musculoskeletal:  Negative for arthralgias.  Skin:  Negative for itching and rash.  Neurological:  Negative for dizziness, extremity weakness, headaches and numbness.  Hematological:  Negative for adenopathy. Does not bruise/bleed easily.  Psychiatric/Behavioral:  Negative for depression. The patient is not nervous/anxious.       PHYSICAL EXAMINATION  ECOG PERFORMANCE STATUS: 1 - Symptomatic but completely ambulatory  Vitals:   01/06/24 0945  BP: (!) 148/93  Pulse: 69  Resp: 18  Temp: 98.3 F (36.8 C)  SpO2: 100%   Physical Exam Constitutional:      Appearance: Normal appearance.  HENT:     Head: Normocephalic and atraumatic.  Cardiovascular:     Rate and Rhythm: Normal rate and regular rhythm.     Pulses: Normal pulses.     Heart sounds: Normal heart sounds.  Pulmonary:      Effort: Pulmonary effort is normal.     Breath sounds: Normal breath sounds.  Chest:     Comments: Bilateral breast examined.  No palpable masses or regional adenopathy Musculoskeletal:        General: No swelling.     Cervical back: No rigidity.  Lymphadenopathy:     Cervical: No cervical adenopathy.  Skin:    General: Skin is warm and dry.  Neurological:     Mental Status: She is alert.     LABORATORY DATA:  CBC    Component Value Date/Time   WBC 4.5 10/09/2023 1359   RBC 4.62 10/09/2023 1359   HGB 13.6 10/09/2023 1359   HGB 12.6 06/04/2022 0928   HCT 40.1 10/09/2023 1359   HCT 40.1 06/04/2022 0928   PLT 262 10/09/2023 1359   PLT 290 06/04/2022 0928   MCV 86.8 10/09/2023 1359   MCV 88 06/04/2022 0928   MCH 29.4 10/09/2023 1359   MCHC 33.9 10/09/2023 1359   RDW 14.5 10/09/2023 1359   RDW 14.5 06/04/2022 0928   LYMPHSABS 2.0 10/09/2023 1359   LYMPHSABS 2.3 06/04/2022 0928   MONOABS 0.4 10/09/2023 1359   EOSABS 0.1 10/09/2023 1359   EOSABS 0.5 (H) 06/04/2022 0928   BASOSABS 0.0 10/09/2023 1359   BASOSABS 0.0 06/04/2022 0928    CMP     Component Value Date/Time   NA 134 (L) 10/09/2023 1359   K 3.7 10/09/2023 1359   CL 101 10/09/2023 1359   CO2 24 10/09/2023 1359   GLUCOSE 91 10/09/2023 1359   BUN 11 10/09/2023 1359   CREATININE 0.80 10/09/2023 1359   CREATININE 1.03 (H) 10/14/2021 1356   CALCIUM 9.2 10/09/2023 1359   PROT 7.7 05/20/2022 1252   ALBUMIN 4.5 05/20/2022 1252   AST 18 05/20/2022 1252   AST 18 10/14/2021 1356   ALT 21 05/20/2022 1252   ALT 21 10/14/2021 1356   ALKPHOS 82 05/20/2022 1252   BILITOT 0.5 05/20/2022 1252   BILITOT 0.8 10/14/2021 1356   GFRNONAA >60  10/09/2023 1359   GFRNONAA >60 10/14/2021 1356   GFRAA >60 08/05/2020 0955   GFRAA >60 06/04/2020 0830    ASSESSMENT and THERAPY PLAN:   Malignant neoplasm of upper-outer quadrant of left breast in female, estrogen receptor negative (HCC) Ms. Tuccillo is a 38 year old woman  with 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 She is here for surveillance.  Breast Cancer Surveillance No concerns for breast cancer recurrence. Discussed about Guardant reveal, not yet FDA approved but available at no cost. -Provided information for patient to consider and contact if interested. -Scheduled mammogram due in June.  Uveitis Persistent blurred vision despite steroid eye drops. -Continue current treatment as managed by ophthalmologist.  Obesity Difficulty with weight loss despite exercise and attempts at dietary changes. Discussed the importance of caloric deficit and the role of diet in weight loss. -Consider consultation with weight loss clinic when financially feasible. -Continue current exercise regimen Follow-up in 6 months or sooner if any changes occur.   Total time spent: 30 min  *Total Encounter Time as defined by the Centers for Medicare and Medicaid Services includes, in addition to the face-to-face time of a patient visit (documented in the note above) non-face-to-face time: obtaining and reviewing outside history, ordering and reviewing medications, tests or procedures, care coordination (communications with other health care professionals or caregivers) and documentation in the medical record.

## 2024-01-06 NOTE — Assessment & Plan Note (Addendum)
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 She is here for surveillance.  Breast Cancer Surveillance No concerns for breast cancer recurrence. Discussed about Guardant reveal, not yet FDA approved but available at no cost. -Provided information for patient to consider and contact if interested. -Scheduled mammogram due in June.  Uveitis Persistent blurred vision despite steroid eye drops. -Continue current treatment as managed by ophthalmologist.  Obesity Difficulty with weight loss despite exercise and attempts at dietary changes. Discussed the importance of caloric deficit and the role of diet in weight loss. -Consider consultation with weight loss clinic when financially feasible. -Continue current exercise regimen Follow-up in 6 months or sooner if any changes occur.

## 2024-01-17 ENCOUNTER — Ambulatory Visit: Payer: 59

## 2024-01-17 DIAGNOSIS — J4550 Severe persistent asthma, uncomplicated: Secondary | ICD-10-CM

## 2024-01-31 ENCOUNTER — Ambulatory Visit: Admitting: *Deleted

## 2024-01-31 DIAGNOSIS — J4550 Severe persistent asthma, uncomplicated: Secondary | ICD-10-CM

## 2024-02-14 ENCOUNTER — Ambulatory Visit

## 2024-02-16 ENCOUNTER — Ambulatory Visit (INDEPENDENT_AMBULATORY_CARE_PROVIDER_SITE_OTHER)

## 2024-02-16 DIAGNOSIS — J4550 Severe persistent asthma, uncomplicated: Secondary | ICD-10-CM

## 2024-02-28 ENCOUNTER — Ambulatory Visit

## 2024-02-28 DIAGNOSIS — J4550 Severe persistent asthma, uncomplicated: Secondary | ICD-10-CM | POA: Diagnosis not present

## 2024-02-29 ENCOUNTER — Ambulatory Visit

## 2024-03-01 ENCOUNTER — Ambulatory Visit

## 2024-03-15 ENCOUNTER — Ambulatory Visit

## 2024-03-15 DIAGNOSIS — J4550 Severe persistent asthma, uncomplicated: Secondary | ICD-10-CM

## 2024-03-26 ENCOUNTER — Telehealth: Payer: Self-pay | Admitting: Hematology and Oncology

## 2024-03-26 NOTE — Telephone Encounter (Signed)
 Left vm for pt about rescheduled appt date and time.

## 2024-03-29 ENCOUNTER — Ambulatory Visit

## 2024-03-29 DIAGNOSIS — J4550 Severe persistent asthma, uncomplicated: Secondary | ICD-10-CM | POA: Diagnosis not present

## 2024-04-16 ENCOUNTER — Ambulatory Visit

## 2024-04-16 DIAGNOSIS — J4550 Severe persistent asthma, uncomplicated: Secondary | ICD-10-CM

## 2024-04-20 ENCOUNTER — Ambulatory Visit: Payer: 59 | Admitting: Hematology and Oncology

## 2024-04-20 ENCOUNTER — Other Ambulatory Visit: Payer: 59

## 2024-04-23 ENCOUNTER — Other Ambulatory Visit: Payer: Self-pay | Admitting: *Deleted

## 2024-04-23 ENCOUNTER — Telehealth: Payer: Self-pay

## 2024-04-23 DIAGNOSIS — Z171 Estrogen receptor negative status [ER-]: Secondary | ICD-10-CM

## 2024-04-23 NOTE — Telephone Encounter (Signed)
 Verbally confirmed appt for 6/10

## 2024-04-24 ENCOUNTER — Encounter: Payer: Self-pay | Admitting: Hematology and Oncology

## 2024-04-24 ENCOUNTER — Inpatient Hospital Stay: Attending: Hematology and Oncology

## 2024-04-24 ENCOUNTER — Inpatient Hospital Stay (HOSPITAL_BASED_OUTPATIENT_CLINIC_OR_DEPARTMENT_OTHER): Admitting: Hematology and Oncology

## 2024-04-24 DIAGNOSIS — Z8042 Family history of malignant neoplasm of prostate: Secondary | ICD-10-CM | POA: Insufficient documentation

## 2024-04-24 DIAGNOSIS — Z171 Estrogen receptor negative status [ER-]: Secondary | ICD-10-CM

## 2024-04-24 DIAGNOSIS — Z79899 Other long term (current) drug therapy: Secondary | ICD-10-CM | POA: Diagnosis not present

## 2024-04-24 DIAGNOSIS — C50412 Malignant neoplasm of upper-outer quadrant of left female breast: Secondary | ICD-10-CM | POA: Diagnosis not present

## 2024-04-24 DIAGNOSIS — H209 Unspecified iridocyclitis: Secondary | ICD-10-CM | POA: Insufficient documentation

## 2024-04-24 DIAGNOSIS — Z853 Personal history of malignant neoplasm of breast: Secondary | ICD-10-CM | POA: Diagnosis not present

## 2024-04-24 DIAGNOSIS — Z923 Personal history of irradiation: Secondary | ICD-10-CM | POA: Insufficient documentation

## 2024-04-24 DIAGNOSIS — Z8041 Family history of malignant neoplasm of ovary: Secondary | ICD-10-CM | POA: Insufficient documentation

## 2024-04-24 DIAGNOSIS — Z801 Family history of malignant neoplasm of trachea, bronchus and lung: Secondary | ICD-10-CM | POA: Insufficient documentation

## 2024-04-24 DIAGNOSIS — D869 Sarcoidosis, unspecified: Secondary | ICD-10-CM | POA: Insufficient documentation

## 2024-04-24 DIAGNOSIS — Z9221 Personal history of antineoplastic chemotherapy: Secondary | ICD-10-CM | POA: Diagnosis not present

## 2024-04-24 DIAGNOSIS — H5711 Ocular pain, right eye: Secondary | ICD-10-CM | POA: Diagnosis not present

## 2024-04-24 DIAGNOSIS — R519 Headache, unspecified: Secondary | ICD-10-CM | POA: Diagnosis not present

## 2024-04-24 LAB — CMP (CANCER CENTER ONLY)
ALT: 22 U/L (ref 0–44)
AST: 19 U/L (ref 15–41)
Albumin: 4.4 g/dL (ref 3.5–5.0)
Alkaline Phosphatase: 77 U/L (ref 38–126)
Anion gap: 6 (ref 5–15)
BUN: 11 mg/dL (ref 6–20)
CO2: 29 mmol/L (ref 22–32)
Calcium: 9.3 mg/dL (ref 8.9–10.3)
Chloride: 106 mmol/L (ref 98–111)
Creatinine: 0.92 mg/dL (ref 0.44–1.00)
GFR, Estimated: >60 mL/min (ref >60)
Glucose, Bld: 107 mg/dL — ABNORMAL HIGH (ref 70–99)
Potassium: 3.8 mmol/L (ref 3.5–5.1)
Sodium: 141 mmol/L (ref 135–145)
Total Bilirubin: 0.6 mg/dL (ref 0.0–1.2)
Total Protein: 7.4 g/dL (ref 6.5–8.1)

## 2024-04-24 LAB — CBC WITH DIFFERENTIAL (CANCER CENTER ONLY)
Abs Immature Granulocytes: 0.01 10*3/uL (ref 0.00–0.07)
Basophils Absolute: 0 10*3/uL (ref 0.0–0.1)
Basophils Relative: 1 %
Eosinophils Absolute: 0.1 10*3/uL (ref 0.0–0.5)
Eosinophils Relative: 2 %
HCT: 38.1 % (ref 36.0–46.0)
Hemoglobin: 13.1 g/dL (ref 12.0–15.0)
Immature Granulocytes: 0 %
Lymphocytes Relative: 38 %
Lymphs Abs: 1.7 10*3/uL (ref 0.7–4.0)
MCH: 29.4 pg (ref 26.0–34.0)
MCHC: 34.4 g/dL (ref 30.0–36.0)
MCV: 85.6 fL (ref 80.0–100.0)
Monocytes Absolute: 0.4 10*3/uL (ref 0.1–1.0)
Monocytes Relative: 8 %
Neutro Abs: 2.3 10*3/uL (ref 1.7–7.7)
Neutrophils Relative %: 51 %
Platelet Count: 253 10*3/uL (ref 150–400)
RBC: 4.45 MIL/uL (ref 3.87–5.11)
RDW: 14.4 % (ref 11.5–15.5)
WBC Count: 4.6 10*3/uL (ref 4.0–10.5)
nRBC: 0 % (ref 0.0–0.2)

## 2024-04-24 LAB — TSH: TSH: 0.799 u[IU]/mL (ref 0.350–4.500)

## 2024-04-24 NOTE — Progress Notes (Signed)
 Jeanne Soda, MD [address] [place] [address]   DIAGNOSIS:  Cancer Staging  Malignant neoplasm of upper-outer quadrant of left breast in female, estrogen receptor negative (HCC) Staging form: Breast, AJCC 8th Edition - Clinical stage from 06/04/2020: Stage IIB (cT2, cN0(f), cM0, G3, ER-, PR-, HER2-) - Signed by Murleen Arms, MD on 01/06/2024 Stage prefix: Initial diagnosis Method of lymph node assessment: Core biopsy Histologic grading system: 3 grade system   SUMMARY OF ONCOLOGIC HISTORY:  38 y.o. [place] woman status post left breast upper outer quadrant biopsy 05/29/2020 for a clinical T2 N0, stage IIB invasive ductal carcinoma, functionally triple negative, with an MIB-1 of 95%.   (1) genetics testing 06/23/2020 through the [facility] Multi-Cancer Panel found no deleterious mutations in AIP, ALK, APC, ATM, AXIN2,BAP1,  BARD1, BLM, BMPR1A, BRCA1, BRCA2, BRIP1, CASR, CDC73, CDH1, CDK4, CDKN1B, CDKN1C, CDKN2A (p14ARF), CDKN2A (p16INK4a), CEBPA, CHEK2, CTNNA1, DICER1, DIS3L2, EGFR (c.2369C>T, p.Thr790Met variant only), EPCAM (Deletion/duplication testing only), FH, FLCN, GATA2, GPC3, GREM1 (Promoter region deletion/duplication testing only), HOXB13 (c.251G>A, p.Gly84Glu), HRAS, KIT, MAX, MEN1, MET, MITF (c.952G>A, p.Glu318Lys variant only), MLH1, MSH2, MSH3, MSH6, MUTYH, NBN, NF1, NF2, NTHL1, PALB2, PDGFRA, PHOX2B, PMS2, POLD1, POLE, POT1, PRKAR1A, PTCH1, PTEN, RAD50, RAD51C, RAD51D, RB1, RECQL4, RET, RNF43, RUNX1, SDHAF2, SDHA (sequence changes only), SDHB, SDHC, SDHD, SMAD4, SMARCA4, SMARCB1, SMARCE1, STK11, SUFU, TERC, TERT, TMEM127, TP53, TSC1, TSC2, VHL, WRN and WT1.    (2) neoadjuvant chemotherapy consisting of cyclophosphamide  and doxorubicin  in dose dense fashion x4 started 06/19/2020, completed 08/05/2020, followed by paclitaxel  and carboplatin  weekly x12 starting 08/26/2020, completing 4 doses 09/23/2020             (a) echocardiogram on 06/13/2020 showed an EF  of 60-65%             (b) paclitaxel  and carboplatin  discontinued after 4 doses because of neuropathy and cytopenias   (3) status post left lumpectomy and sentinel lymph node sampling 11/19/2020 for a residual yp T1c ypN0 invasive ductal carcinoma involving the superior margin             (a) additional surgery 12/09/2020 cleared the margin in question             (b) repeat prognostic panel confirms triple negative disease             (c) status post left mastopexy and right breast reduction on 10/27/2021 with fat grafting bilaterally   (4) adjuvant radiation : 01/22/2021 through 03/10/2021 Site Technique Total Dose (Gy) Dose per Fx (Gy) Completed Fx Beam Energies  Breast, Left: Breast_Lt 3D 50.4/50.4 1.8 28/28 6X  Breast, Left: Breast_Lt_Bst 3D 10/10 2 5/5 6X              (a) received capecitabine  sensitization, started 01/26/2021   (5) pembrolizumab /Keytruda  began on 04/16/2021, held 05/11/2021 (after 2 doses) with persistent cough, later attributed to asthma (A) pembrolizumab  resumed 08/06/2021 (B) PD-L1 combined score is 10   (6) molecular pathology from the 11/19/2020 sample 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  There were no mutations in BRCA1 or 2, ERBB2 or PIK3CA.  The microsatellite status was equivocal and the tumor mutational burden could not be determined on the sample.   (7) sarcoidosis? (followed by pulmonary: Ramaswamy)             (A) chest CT 06/13/2020 shows in addition to the left breast mass multiple ill-defined pulmonary nodules, pleural nodularity and symmetrical by hilar and mediastinal adenopathy consistent with sarcoidosis. (B) chest CT  05/29/2021 shows stable mediastinal and hilar adenopathy as well as improved liver lesions.  No evidence of metastatic disease. (C) CT angiogram of the chest on February 02, 2022 shows no evidence of pulmonary embolus and improvement in possible sarcoidosis.  No progressive or new nodularity.  Bronchial wall thickening in the lower lobes which can  be seen with bronchitis or reactive airway disease.  CURRENT THERAPY: observation  INTERVAL HISTORY:  Jeanne Evans 38 y.o. female returns for follow up.    History of Present Illness    Discussed the use of AI scribe software for clinical note transcription with the patient, who gave verbal consent to proceed.  History of Present Illness Jeanne Evans is a 38 year old female with breast cancer and sarcoidosis who presents with ongoing eye symptoms and medication concerns.  She has been experiencing ongoing issues with her right eye, including intermittent blurriness and headaches associated with eye pain. These symptoms have persisted despite treatment with azathioprine and steroid drops. Since discontinuing the steroid drops, the blurriness, described as 'fuzzy looking', occurs sporadically, such as on Sunday when she had difficulty seeing clearly.  She is frustrated with her current treatment regimen, particularly noting the impact of steroids on her ability to lose weight. She is concerned about the risk of blindness if she discontinues azathioprine, which she has been taking consistently.  Her breathing has been stable, and she has no new issues with urination or bone pain. However, she reports experiencing cramps in her calves, the cause of which is unclear.  She is interested in [place] reveal, will do some research and see if she wants to consider.  Rest of the pertinent 10 point ROS reviewed and neg.  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   Lichen planus 01/21/2023   Gastroesophageal reflux disease without esophagitis 01/21/2023   Other allergic rhinitis 01/21/2023   Port-A-Cath in place 03/11/2022   Acute respiratory failure with hypoxemia (HCC) 02/03/2022   Acute respiratory failure with hypoxia  (HCC) 02/03/2022   Asthma 07/31/2021   Lymphedema of left arm 02/02/2021   Drug-induced neutropenia (HCC) 09/30/2020   Genetic testing 06/26/2020   Family history of ovarian cancer    Family history of prostate cancer    Malignant neoplasm of upper-outer quadrant of left breast in female, estrogen receptor negative (HCC) 06/03/2020    is allergic to sulfa antibiotics.  MEDICAL HISTORY: Past Medical History:  Diagnosis Date   Asthma 07/31/2021   Breast cancer (HCC)    Family history of ovarian cancer    Family history of prostate cancer    GERD (gastroesophageal reflux disease)    History of asthma    has albuterol , uses PRN   History of heart murmur in childhood    History of multiple allergies     SURGICAL HISTORY: Past Surgical History:  Procedure Laterality Date   BREAST LUMPECTOMY WITH RADIOACTIVE SEED AND SENTINEL LYMPH NODE BIOPSY Left 11/19/2020   Procedure: LEFT BREAST LUMPECTOMY X 2 WITH RADIOACTIVE SEED AND SENTINEL LYMPH NODE MAPPING;  Surgeon: Sim Dryer, MD;  Location: [facility];  Service: General;  Laterality: Left;  PECTORAL BLOCK   BREAST REDUCTION SURGERY Right 10/27/2021   Procedure: MAMMARY REDUCTION  TO RIGHT BREAST;  Surgeon: Barb Bonito, MD;  Location: [facility];  Service: Plastics;  Laterality: Right;   LIPOSUCTION WITH LIPOFILLING Bilateral 10/27/2021   Procedure: BILATERAL FAT GRAFTING TO BREAST;  Surgeon: Barb Bonito, MD;  Location: [facility];  Service: Plastics;  Laterality: Bilateral;   PORTACATH PLACEMENT Right 06/18/2020   Procedure: INSERTION PORT-A-CATH WITH ULTRASOUND GUIDANCE;  Surgeon: Sim Dryer, MD;  Location: [facility];  Service: General;  Laterality: Right;   RE-EXCISION OF BREAST LUMPECTOMY Left 12/09/2020   Procedure: RE-EXCISION OF LEFT BREAST LUMPECTOMY;  Surgeon: Sim Dryer, MD;  Location:  SURGERY CENTER;  Service: General;  Laterality: Left;     SOCIAL HISTORY: Social History   Socioeconomic History   Marital status: Single    Spouse name: Not on file   Number of children: 2   Years of education: 12   Highest education level: Not on file  Occupational History   Not on file  Tobacco Use   Smoking status: Never    Passive exposure: Never   Smokeless tobacco: Never  Vaping Use   Vaping status: Never Used  Substance and Sexual Activity   Alcohol use: Yes    Comment: socially   Drug use: Yes    Types: Marijuana    Comment: occass   Sexual activity: Not on file  Other Topics Concern   Not on file  Social History Narrative   Right handed   Caffeine use: drinks about 1 cup coffee every morning (5 days per week)   Social Drivers of Health   Financial Resource Strain: High Risk (09/18/2021)   Overall Financial Resource Strain (CARDIA)    Difficulty of Paying Living Expenses: Very hard  Food Insecurity: Food Insecurity Present (09/18/2021)   Hunger Vital Sign    Worried About Running Out of Food in the Last Year: Sometimes true    Ran Out of Food in the Last Year: Sometimes true  Transportation Needs: No Transportation Needs (09/18/2021)   PRAPARE - Administrator, Civil Service (Medical): No    Lack of Transportation (Non-Medical): No  Physical Activity: Not on file  Stress: Stress Concern Present (09/18/2021)   [employer] of Occupational Health - Occupational Stress Questionnaire    Feeling of Stress : Very much  Social Connections: Unknown (12/31/2022)   Received from [facility], [facility]   Social Network    Social Network: Not on file  Intimate Partner Violence: Unknown (12/31/2022)   [facility] from [facility], [facility]   HITS    Physically Hurt: Not on file    Insult or Talk Down To: Not on file    Threaten Physical Harm: Not on file    Scream or Curse: Not on file    FAMILY HISTORY: Family History  Problem Relation Age of Onset   Asthma Mother    High blood pressure  Mother    High blood pressure Father    High Cholesterol Father    Asthma Brother    Ovarian cancer Paternal Grandmother        dx 83s   Prostate cancer Paternal Grandfather        dx 52s   Lung cancer Maternal Uncle        dx late 26s   Prostate cancer Paternal Uncle        dx late 60s   Prostate cancer Paternal Uncle        dx early 6s    Review of Systems  Constitutional:  Negative for appetite change, chills, fatigue, fever and unexpected weight  change.  HENT:   Negative for hearing loss, lump/mass, mouth sores, sore throat and trouble swallowing.   Eyes:  Positive for eye problems. Negative for icterus.  Respiratory:  Negative for chest tightness, cough and shortness of breath.   Cardiovascular:  Negative for chest pain, leg swelling and palpitations.  Gastrointestinal:  Negative for abdominal distention, abdominal pain, constipation, diarrhea, nausea and vomiting.  Endocrine: Negative for hot flashes.  Genitourinary:  Negative for difficulty urinating.   Musculoskeletal:  Negative for arthralgias.  Skin:  Negative for itching and rash.  Neurological:  Negative for dizziness, extremity weakness, headaches and numbness.  Hematological:  Negative for adenopathy. Does not bruise/bleed easily.  Psychiatric/Behavioral:  Negative for depression. The patient is not nervous/anxious.       PHYSICAL EXAMINATION  ECOG PERFORMANCE STATUS: 1 - Symptomatic but completely ambulatory  Vitals:   04/24/24 1032  BP: 127/82  Pulse: 80  Resp: 16  Temp: 98.4 F (36.9 C)  SpO2: 100%    Physical Exam Constitutional:      Appearance: Normal appearance.  HENT:     Head: Normocephalic and atraumatic.  Cardiovascular:     Rate and Rhythm: Normal rate and regular rhythm.     Pulses: Normal pulses.     Heart sounds: Normal heart sounds.  Pulmonary:     Effort: Pulmonary effort is normal.     Breath sounds: Normal breath sounds.  Chest:     Comments: Bilateral breast examined.  No  palpable masses or regional adenopathy Musculoskeletal:        General: No swelling.     Cervical back: No rigidity.  Lymphadenopathy:     Cervical: No cervical adenopathy.  Skin:    General: Skin is warm and dry.  Neurological:     Mental Status: She is alert.     LABORATORY DATA:  CBC    Component Value Date/Time   WBC 4.6 04/24/2024 1016   WBC 4.5 10/09/2023 1359   RBC 4.45 04/24/2024 1016   HGB 13.1 04/24/2024 1016   HGB 12.6 06/04/2022 0928   HCT 38.1 04/24/2024 1016   HCT 40.1 06/04/2022 0928   PLT 253 04/24/2024 1016   PLT 290 06/04/2022 0928   MCV 85.6 04/24/2024 1016   MCV 88 06/04/2022 0928   MCH 29.4 04/24/2024 1016   MCHC 34.4 04/24/2024 1016   RDW 14.4 04/24/2024 1016   RDW 14.5 06/04/2022 0928   LYMPHSABS 1.7 04/24/2024 1016   LYMPHSABS 2.3 06/04/2022 0928   MONOABS 0.4 04/24/2024 1016   EOSABS 0.1 04/24/2024 1016   EOSABS 0.5 (H) 06/04/2022 0928   BASOSABS 0.0 04/24/2024 1016   BASOSABS 0.0 06/04/2022 0928    CMP     Component Value Date/Time   NA 141 04/24/2024 1016   K 3.8 04/24/2024 1016   CL 106 04/24/2024 1016   CO2 29 04/24/2024 1016   GLUCOSE 107 (H) 04/24/2024 1016   BUN 11 04/24/2024 1016   CREATININE 0.92 04/24/2024 1016   CALCIUM 9.3 04/24/2024 1016   PROT 7.4 04/24/2024 1016   ALBUMIN 4.4 04/24/2024 1016   AST 19 04/24/2024 1016   ALT 22 04/24/2024 1016   ALKPHOS 77 04/24/2024 1016   BILITOT 0.6 04/24/2024 1016   GFRNONAA >60 04/24/2024 1016   GFRAA >60 08/05/2020 0955   GFRAA >60 06/04/2020 0830    ASSESSMENT and THERAPY PLAN:   Malignant neoplasm of upper-outer quadrant of left breast in female, estrogen receptor negative (HCC) Ms. Bazar is a 38 year old  woman with stage IIb invasive ductal carcinoma triple negative status post neoadjuvant chemotherapy, lumpectomy, adjuvant radiation with capecitabine  sensitization, adjuvant pembrolizumab . She is here for surveillance.  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 Limited improvement with azathioprine and steroids. Discontinuation of steroid drops increased headaches and eye pain. Ophthalmologist advised against discontinuing azathioprine due to risk of blindness. - Discuss alternative immunosuppressants with ophthalmologist. - Continue azathioprine as advised by ophthalmologist.  History of breast cancer,  Three years post-cancer diagnosis and treatment.  Consider [employer] reveal for MRD testing - Scheduled mammogram for May 11, 2024. - No concern for recurrence based on ROS/PE     Total time spent: 30 min  *Total Encounter Time as defined by the Centers for Medicare and Medicaid Services includes, in addition to the face-to-face time of a patient visit (documented in the note above) non-face-to-face time: obtaining and reviewing outside history, ordering and reviewing medications, tests or procedures, care coordination (communications with other health care professionals or caregivers) and documentation in the medical record.

## 2024-04-24 NOTE — Assessment & Plan Note (Addendum)
 Jeanne Evans is a 38 year old woman with stage IIb invasive ductal carcinoma triple negative status post neoadjuvant chemotherapy, lumpectomy, adjuvant radiation with capecitabine  sensitization, adjuvant pembrolizumab . She is here for surveillance.  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 Limited improvement with azathioprine and steroids. Discontinuation of steroid drops increased headaches and eye pain. Ophthalmologist advised against discontinuing azathioprine due to risk of blindness. - Discuss alternative immunosuppressants with ophthalmologist. - Continue azathioprine as advised by ophthalmologist.  History of breast cancer,  Three years post-cancer diagnosis and treatment.  Consider [employer] reveal for MRD testing - Scheduled mammogram for May 11, 2024. - No concern for recurrence based on ROS/PE

## 2024-05-01 ENCOUNTER — Ambulatory Visit (INDEPENDENT_AMBULATORY_CARE_PROVIDER_SITE_OTHER)

## 2024-05-01 DIAGNOSIS — J4550 Severe persistent asthma, uncomplicated: Secondary | ICD-10-CM | POA: Diagnosis not present

## 2024-05-11 ENCOUNTER — Ambulatory Visit
Admission: RE | Admit: 2024-05-11 | Discharge: 2024-05-11 | Disposition: A | Discharge status: Home / Self Care | Source: Ambulatory Visit | Attending: Hematology and Oncology | Admitting: Hematology and Oncology

## 2024-05-11 DIAGNOSIS — C50412 Malignant neoplasm of upper-outer quadrant of left female breast: Secondary | ICD-10-CM

## 2024-05-15 ENCOUNTER — Ambulatory Visit

## 2024-05-15 DIAGNOSIS — J4550 Severe persistent asthma, uncomplicated: Secondary | ICD-10-CM

## 2024-05-31 ENCOUNTER — Ambulatory Visit

## 2024-05-31 DIAGNOSIS — J4550 Severe persistent asthma, uncomplicated: Secondary | ICD-10-CM

## 2024-06-04 ENCOUNTER — Other Ambulatory Visit: Payer: Self-pay | Admitting: Internal Medicine

## 2024-06-13 NOTE — Patient Instructions (Incomplete)
 Asthma Continue Trelegy 200-1 puff once a day to prevent cough or wheeze Continue albuterol  2 puffs once every 4 hours if needed for cough or wheeze You may use albuterol  2 puffs 5-15 minutes before activity to decrease cough or wheeze  Continue Dupixent  300 mg injections once every 2 weeks  Allergic rhinitis Continue allergen avoidance measures directed toward [name] grass and mountain cedar tree pollen as listed below Continue Claritin 10 mg once a day if needed for runny nose or itch Continue Flonase  2 sprays in each nostril once a day if needed for stuffy nose.  In the right nostril, point the applicator out toward the right ear. In the left nostril, point the applicator out toward the left ear Consider saline nasal rinses as needed for nasal symptoms. Use this before any medicated nasal sprays for best result Consider allergen immunotherapy if your symptoms are not well-controlled with the treatment plan as listed above  Reflux Continue dietary and lifestyle modifications Restart omeprazole  as previously ordered.  Take this medication 30 to 60 minutes before your first meal for best results  Atopic dermatitis Continue a twice a day moisturizing routine Continue Dupixent  300 mg once every 2 weeks Continue to follow-up with your dermatology specialist  Call the clinic if this treatment plan is not working well for you.  Follow up in 6 months or sooner if needed.  Reducing Pollen Exposure The American Academy of Allergy, Asthma and Immunology suggests the following steps to reduce your exposure to pollen during allergy seasons. Do not hang sheets or clothing out to dry; pollen may collect on these items. Do not mow lawns or spend time around freshly cut grass; mowing stirs up pollen. Keep windows closed at night.  Keep car windows closed while driving. Minimize morning activities outdoors, a time when pollen counts are usually at their highest. Stay indoors as much as possible when  pollen counts or humidity is high and on windy days when pollen tends to remain in the air longer. Use air conditioning when possible.  Many air conditioners have filters that trap the pollen spores. Use a HEPA room air filter to remove pollen form the indoor air you breathe.

## 2024-06-13 NOTE — Progress Notes (Signed)
   [address] [place] [address] Dept: [phone]  FOLLOW UP NOTE  Patient ID: Zorah D Smalling, female    DOB: 1986/09/10  Age: 38 y.o. MRN: [id number] Date of Office Visit: 06/14/2024  Assessment  Chief Complaint: No chief complaint on file.  HPI DANYAL ADORNO is a 38 year old female who presents to the clinic for a follow up visit. She was last seen in this clinic on 07/01/2023 by Dr. Lorin for evaluation of asthma, allergic rhinitis, an reflux. Her current problem list includes uveitis for which she continues to follow up with ophthalmology and rheumatology and lichen planus for which she follows up with dermatology.   Her last environmental allergy testing via lab on 06/04/2022 was positive to [name] grass pollen and mountain ceder tree pollen.   Discussed the use of AI scribe software for clinical note transcription with the patient, who gave verbal consent to proceed.  History of Present Illness      Drug Allergies:  Allergies  Allergen Reactions   Sulfa Antibiotics Hives    Physical Exam: There were no vitals taken for this visit.   Physical Exam  Diagnostics:    Assessment and Plan: No diagnosis found.  No orders of the defined types were placed in this encounter.   There are no Patient Instructions on file for this visit.  No follow-ups on file.    Thank you for the opportunity to care for this patient.  Please do not hesitate to contact me with questions.  Arlean Mutter, FNP [facility]

## 2024-06-14 ENCOUNTER — Ambulatory Visit: Admitting: Family Medicine

## 2024-06-14 ENCOUNTER — Other Ambulatory Visit: Payer: Self-pay

## 2024-06-14 ENCOUNTER — Ambulatory Visit

## 2024-06-14 ENCOUNTER — Encounter: Payer: Self-pay | Admitting: Family Medicine

## 2024-06-14 VITALS — BP 126/86 | HR 71 | Temp 98.0°F | Resp 16 | Ht 62.0 in | Wt 200.0 lb

## 2024-06-14 DIAGNOSIS — K2100 Gastro-esophageal reflux disease with esophagitis, without bleeding: Secondary | ICD-10-CM

## 2024-06-14 DIAGNOSIS — J301 Allergic rhinitis due to pollen: Secondary | ICD-10-CM

## 2024-06-14 DIAGNOSIS — J45909 Unspecified asthma, uncomplicated: Secondary | ICD-10-CM

## 2024-06-14 DIAGNOSIS — L2084 Intrinsic (allergic) eczema: Secondary | ICD-10-CM | POA: Diagnosis not present

## 2024-06-14 DIAGNOSIS — K219 Gastro-esophageal reflux disease without esophagitis: Secondary | ICD-10-CM | POA: Diagnosis not present

## 2024-06-14 DIAGNOSIS — J4550 Severe persistent asthma, uncomplicated: Secondary | ICD-10-CM

## 2024-06-14 MED ORDER — ALBUTEROL SULFATE HFA 108 (90 BASE) MCG/ACT IN AERS: 2.0000 | INHALATION_SPRAY | Freq: Four times a day (QID) | RESPIRATORY_TRACT | 1 refills | Status: AC | PRN

## 2024-06-14 MED ORDER — OMEPRAZOLE 40 MG PO CPDR: 40.0000 mg | DELAYED_RELEASE_CAPSULE | Freq: Every day | ORAL | 3 refills | Status: AC

## 2024-06-14 MED ORDER — FLUTICASONE PROPIONATE 50 MCG/ACT NA SUSP: 2.0000 | Freq: Every day | NASAL | 2 refills | Status: DC

## 2024-06-14 MED ORDER — TRELEGY ELLIPTA 200-62.5-25 MCG/ACT IN AEPB: 1.0000 | INHALATION_SPRAY | Freq: Every day | RESPIRATORY_TRACT | 6 refills | Status: DC

## 2024-06-14 MED ORDER — AZELASTINE HCL 0.1 % NA SOLN: 2.0000 | Freq: Two times a day (BID) | NASAL | 6 refills | Status: DC

## 2024-06-28 ENCOUNTER — Ambulatory Visit

## 2024-07-03 ENCOUNTER — Ambulatory Visit

## 2024-07-03 DIAGNOSIS — J4550 Severe persistent asthma, uncomplicated: Secondary | ICD-10-CM

## 2024-07-05 ENCOUNTER — Inpatient Hospital Stay: Payer: 59 | Attending: Hematology and Oncology | Admitting: Hematology and Oncology

## 2024-07-05 VITALS — BP 121/80 | HR 69 | Temp 98.0°F | Resp 18 | Wt 203.8 lb

## 2024-07-05 DIAGNOSIS — Z171 Estrogen receptor negative status [ER-]: Secondary | ICD-10-CM | POA: Diagnosis not present

## 2024-07-05 DIAGNOSIS — Z9221 Personal history of antineoplastic chemotherapy: Secondary | ICD-10-CM | POA: Insufficient documentation

## 2024-07-05 DIAGNOSIS — Z853 Personal history of malignant neoplasm of breast: Secondary | ICD-10-CM | POA: Insufficient documentation

## 2024-07-05 DIAGNOSIS — C50412 Malignant neoplasm of upper-outer quadrant of left female breast: Secondary | ICD-10-CM | POA: Diagnosis not present

## 2024-07-05 DIAGNOSIS — H209 Unspecified iridocyclitis: Secondary | ICD-10-CM | POA: Insufficient documentation

## 2024-07-05 DIAGNOSIS — Z923 Personal history of irradiation: Secondary | ICD-10-CM | POA: Diagnosis not present

## 2024-07-05 DIAGNOSIS — Z8042 Family history of malignant neoplasm of prostate: Secondary | ICD-10-CM | POA: Diagnosis not present

## 2024-07-05 DIAGNOSIS — D8689 Sarcoidosis of other sites: Secondary | ICD-10-CM | POA: Diagnosis present

## 2024-07-05 DIAGNOSIS — Z801 Family history of malignant neoplasm of trachea, bronchus and lung: Secondary | ICD-10-CM | POA: Insufficient documentation

## 2024-07-05 DIAGNOSIS — Z8041 Family history of malignant neoplasm of ovary: Secondary | ICD-10-CM | POA: Diagnosis not present

## 2024-07-05 NOTE — Assessment & Plan Note (Addendum)
 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 Ophthalmologist confirmed ocular symptoms consistent with sarcoidosis. - Continue Humira and Cellcept per opthalmology  History of breast cancer Recent mammogram normal, no lumps detected.  No concerns on ROS and PE. Explained that breast cancer is not typically linked to a single dietary factor. - Provide referral to a nutritionist.  Follow-Up Follow-up scheduled to monitor conditions and treatment efficacy.

## 2024-07-05 NOTE — Progress Notes (Signed)
 Armond Cape, MD [address] [place] [address]   DIAGNOSIS:  Cancer Staging  Malignant neoplasm of upper-outer quadrant of left breast in female, estrogen receptor negative (HCC) Staging form: Breast, AJCC 8th Edition - Clinical stage from 06/04/2020: Stage IIB (cT2, cN0(f), cM0, G3, ER-, PR-, HER2-) - Signed by Loretha Ash, MD on 01/06/2024 Stage prefix: Initial diagnosis Method of lymph node assessment: Core biopsy Histologic grading system: 3 grade system   SUMMARY OF ONCOLOGIC HISTORY:  38 y.o. [place] woman status post left breast upper outer quadrant biopsy 05/29/2020 for a clinical T2 N0, stage IIB invasive ductal carcinoma, functionally triple negative, with an MIB-1 of 95%.   (1) genetics testing 06/23/2020 through the [facility] Multi-Cancer Panel found no deleterious mutations in AIP, ALK, APC, ATM, AXIN2,BAP1,  BARD1, BLM, BMPR1A, BRCA1, BRCA2, BRIP1, CASR, CDC73, CDH1, CDK4, CDKN1B, CDKN1C, CDKN2A (p14ARF), CDKN2A (p16INK4a), CEBPA, CHEK2, CTNNA1, DICER1, DIS3L2, EGFR (c.2369C>T, p.Thr790Met variant only), EPCAM (Deletion/duplication testing only), FH, FLCN, GATA2, GPC3, GREM1 (Promoter region deletion/duplication testing only), HOXB13 (c.251G>A, p.Gly84Glu), HRAS, KIT, MAX, MEN1, MET, MITF (c.952G>A, p.Glu318Lys variant only), MLH1, MSH2, MSH3, MSH6, MUTYH, NBN, NF1, NF2, NTHL1, PALB2, PDGFRA, PHOX2B, PMS2, POLD1, POLE, POT1, PRKAR1A, PTCH1, PTEN, RAD50, RAD51C, RAD51D, RB1, RECQL4, RET, RNF43, RUNX1, SDHAF2, SDHA (sequence changes only), SDHB, SDHC, SDHD, SMAD4, SMARCA4, SMARCB1, SMARCE1, STK11, SUFU, TERC, TERT, TMEM127, TP53, TSC1, TSC2, VHL, WRN and WT1.    (2) neoadjuvant chemotherapy consisting of cyclophosphamide  and doxorubicin  in dose dense fashion x4 started 06/19/2020, completed 08/05/2020, followed by paclitaxel  and carboplatin  weekly x12 starting 08/26/2020, completing 4 doses 09/23/2020             (a) echocardiogram on 06/13/2020 showed an EF  of 60-65%             (b) paclitaxel  and carboplatin  discontinued after 4 doses because of neuropathy and cytopenias   (3) status post left lumpectomy and sentinel lymph node sampling 11/19/2020 for a residual yp T1c ypN0 invasive ductal carcinoma involving the superior margin             (a) additional surgery 12/09/2020 cleared the margin in question             (b) repeat prognostic panel confirms triple negative disease             (c) status post left mastopexy and right breast reduction on 10/27/2021 with fat grafting bilaterally   (4) adjuvant radiation : 01/22/2021 through 03/10/2021 Site Technique Total Dose (Gy) Dose per Fx (Gy) Completed Fx Beam Energies  Breast, Left: Breast_Lt 3D 50.4/50.4 1.8 28/28 6X  Breast, Left: Breast_Lt_Bst 3D 10/10 2 5/5 6X              (a) received capecitabine  sensitization, started 01/26/2021   (5) pembrolizumab /Keytruda  began on 04/16/2021, held 05/11/2021 (after 2 doses) with persistent cough, later attributed to asthma (A) pembrolizumab  resumed 08/06/2021 (B) PD-L1 combined score is 10   (6) molecular pathology from the 11/19/2020 sample 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  There were no mutations in BRCA1 or 2, ERBB2 or PIK3CA.  The microsatellite status was equivocal and the tumor mutational burden could not be determined on the sample.   (7) sarcoidosis? (followed by pulmonary: Ramaswamy)             (A) chest CT 06/13/2020 shows in addition to the left breast mass multiple ill-defined pulmonary nodules, pleural nodularity and symmetrical by hilar and mediastinal adenopathy consistent with sarcoidosis. (B) chest CT  05/29/2021 shows stable mediastinal and hilar adenopathy as well as improved liver lesions.  No evidence of metastatic disease. (C) CT angiogram of the chest on February 02, 2022 shows no evidence of pulmonary embolus and improvement in possible sarcoidosis.  No progressive or new nodularity.  Bronchial wall thickening in the lower lobes which can  be seen with bronchitis or reactive airway disease.  CURRENT THERAPY: observation  INTERVAL HISTORY:  Jeanne Evans 38 y.o. female returns for follow up.    History of Present Illness    Discussed the use of AI scribe software for clinical note transcription with the patient, who gave verbal consent to proceed.  Rest of the pertinent 10 point ROS reviewed and neg.  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   Lichen planus 01/21/2023   Gastroesophageal reflux disease without esophagitis 01/21/2023   Other allergic rhinitis 01/21/2023   Port-A-Cath in place 03/11/2022   Acute respiratory failure with hypoxemia (HCC) 02/03/2022   Acute respiratory failure with hypoxia (HCC) 02/03/2022   Not well controlled asthma without complication 07/31/2021   Lymphedema of left arm 02/02/2021   Drug-induced neutropenia (HCC) 09/30/2020   Genetic testing 06/26/2020   Family history of ovarian cancer    Family history of prostate cancer    Malignant neoplasm of upper-outer quadrant of left breast in female, estrogen receptor negative (HCC) 06/03/2020    is allergic to sulfa antibiotics.  MEDICAL HISTORY: Past Medical History:  Diagnosis Date   Asthma 07/31/2021   Breast cancer (HCC)    Family history of ovarian cancer    Family history of prostate cancer    GERD (gastroesophageal reflux disease)    History of asthma    has albuterol , uses PRN   History of heart murmur in childhood    History of multiple allergies     SURGICAL HISTORY: Past Surgical History:  Procedure Laterality Date   BREAST LUMPECTOMY Left 11/2020   BREAST LUMPECTOMY WITH RADIOACTIVE SEED AND SENTINEL LYMPH NODE BIOPSY Left 11/19/2020   Procedure: LEFT  BREAST LUMPECTOMY X 2 WITH RADIOACTIVE SEED AND SENTINEL LYMPH NODE MAPPING;  Surgeon: Vanderbilt Ned, MD;  Location: [facility];  Service: General;  Laterality: Left;  PECTORAL BLOCK   BREAST REDUCTION SURGERY Right 10/27/2021   Procedure: MAMMARY REDUCTION  TO RIGHT BREAST;  Surgeon: Elisabeth Craig RAMAN, MD;  Location: [facility];  Service: Plastics;  Laterality: Right;   LIPOSUCTION WITH LIPOFILLING Bilateral 10/27/2021   Procedure: BILATERAL FAT GRAFTING TO BREAST;  Surgeon: Elisabeth Craig RAMAN, MD;  Location: [facility];  Service: Plastics;  Laterality: Bilateral;   PORTACATH PLACEMENT Right 06/18/2020   Procedure: INSERTION PORT-A-CATH WITH ULTRASOUND GUIDANCE;  Surgeon: Vanderbilt Ned, MD;  Location: [facility];  Service: General;  Laterality: Right;   RE-EXCISION OF BREAST LUMPECTOMY Left 12/09/2020   Procedure: RE-EXCISION OF LEFT BREAST LUMPECTOMY;  Surgeon: Vanderbilt Ned, MD;  Location:  SURGERY CENTER;  Service: General;  Laterality: Left;   REDUCTION MAMMAPLASTY Right     SOCIAL HISTORY: Social History   Socioeconomic History   Marital status: Single    Spouse name: Not on file   Number of children: 2   Years of education: 12   Highest  education level: Not on file  Occupational History   Not on file  Tobacco Use   Smoking status: Never    Passive exposure: Never   Smokeless tobacco: Never  Vaping Use   Vaping status: Never Used  Substance and Sexual Activity   Alcohol use: Yes    Comment: socially   Drug use: Yes    Types: Marijuana    Comment: occass   Sexual activity: Not on file  Other Topics Concern   Not on file  Social History Narrative   Right handed   Caffeine use: drinks about 1 cup coffee every morning (5 days per week)   Social Drivers of Health   Financial Resource Strain: High Risk (09/18/2021)   Overall Financial Resource Strain (CARDIA)    Difficulty of Paying Living Expenses:  Very hard  Food Insecurity: Food Insecurity Present (09/18/2021)   Hunger Vital Sign    Worried About Running Out of Food in the Last Year: Sometimes true    Ran Out of Food in the Last Year: Sometimes true  Transportation Needs: No Transportation Needs (09/18/2021)   PRAPARE - Administrator, Civil Service (Medical): No    Lack of Transportation (Non-Medical): No  Physical Activity: Not on file  Stress: Stress Concern Present (09/18/2021)   [employer] of Occupational Health - Occupational Stress Questionnaire    Feeling of Stress : Very much  Social Connections: Unknown (12/31/2022)   Received from [facility]   Social Network    Social Network: Not on file  Intimate Partner Violence: Unknown (12/31/2022)   Received from [facility]   HITS    Physically Hurt: Not on file    Insult or Talk Down To: Not on file    Threaten Physical Harm: Not on file    Scream or Curse: Not on file    FAMILY HISTORY: Family History  Problem Relation Age of Onset   Asthma Mother    High blood pressure Mother    High blood pressure Father    High Cholesterol Father    Asthma Brother    Ovarian cancer Paternal Grandmother        dx 44s   Prostate cancer Paternal Grandfather        dx 75s   Lung cancer Maternal Uncle        dx late 54s   Prostate cancer Paternal Uncle        dx late 89s   Prostate cancer Paternal Uncle        dx early 61s    Review of Systems  Constitutional:  Negative for appetite change, chills, fatigue, fever and unexpected weight change.  HENT:   Negative for hearing loss, lump/mass, mouth sores, sore throat and trouble swallowing.   Eyes:  Positive for eye problems. Negative for icterus.  Respiratory:  Negative for chest tightness, cough and shortness of breath.   Cardiovascular:  Negative for chest pain, leg swelling and palpitations.  Gastrointestinal:  Negative for abdominal distention, abdominal pain, constipation, diarrhea, nausea and vomiting.   Endocrine: Negative for hot flashes.  Genitourinary:  Negative for difficulty urinating.   Musculoskeletal:  Negative for arthralgias.  Skin:  Negative for itching and rash.  Neurological:  Negative for dizziness, extremity weakness, headaches and numbness.  Hematological:  Negative for adenopathy. Does not bruise/bleed easily.  Psychiatric/Behavioral:  Negative for depression. The patient is not nervous/anxious.       PHYSICAL EXAMINATION  ECOG PERFORMANCE STATUS: 1 - Symptomatic but  completely ambulatory  Vitals:   07/05/24 0933  BP: 121/80  Pulse: 69  Resp: 18  Temp: 98 F (36.7 C)  SpO2: 100%    Physical Exam Constitutional:      Appearance: Normal appearance.  HENT:     Head: Normocephalic and atraumatic.  Cardiovascular:     Rate and Rhythm: Normal rate and regular rhythm.     Pulses: Normal pulses.     Heart sounds: Normal heart sounds.  Pulmonary:     Effort: Pulmonary effort is normal.     Breath sounds: Normal breath sounds.  Chest:     Comments: Bilateral breast examined.  No palpable masses or regional adenopathy Musculoskeletal:        General: No swelling.     Cervical back: No rigidity.  Lymphadenopathy:     Cervical: No cervical adenopathy.  Skin:    General: Skin is warm and dry.  Neurological:     Mental Status: She is alert.     LABORATORY DATA:  CBC    Component Value Date/Time   WBC 4.6 04/24/2024 1016   WBC 4.5 10/09/2023 1359   RBC 4.45 04/24/2024 1016   HGB 13.1 04/24/2024 1016   HGB 12.6 06/04/2022 0928   HCT 38.1 04/24/2024 1016   HCT 40.1 06/04/2022 0928   PLT 253 04/24/2024 1016   PLT 290 06/04/2022 0928   MCV 85.6 04/24/2024 1016   MCV 88 06/04/2022 0928   MCH 29.4 04/24/2024 1016   MCHC 34.4 04/24/2024 1016   RDW 14.4 04/24/2024 1016   RDW 14.5 06/04/2022 0928   LYMPHSABS 1.7 04/24/2024 1016   LYMPHSABS 2.3 06/04/2022 0928   MONOABS 0.4 04/24/2024 1016   EOSABS 0.1 04/24/2024 1016   EOSABS 0.5 (H) 06/04/2022  0928   BASOSABS 0.0 04/24/2024 1016   BASOSABS 0.0 06/04/2022 0928    CMP     Component Value Date/Time   NA 141 04/24/2024 1016   K 3.8 04/24/2024 1016   CL 106 04/24/2024 1016   CO2 29 04/24/2024 1016   GLUCOSE 107 (H) 04/24/2024 1016   BUN 11 04/24/2024 1016   CREATININE 0.92 04/24/2024 1016   CALCIUM 9.3 04/24/2024 1016   PROT 7.4 04/24/2024 1016   ALBUMIN 4.4 04/24/2024 1016   AST 19 04/24/2024 1016   ALT 22 04/24/2024 1016   ALKPHOS 77 04/24/2024 1016   BILITOT 0.6 04/24/2024 1016   GFRNONAA >60 04/24/2024 1016   GFRAA >60 08/05/2020 0955   GFRAA >60 06/04/2020 0830    ASSESSMENT and THERAPY PLAN:   No problem-specific Assessment & Plan notes found for this encounter.  Total time spent: 30 min  *Total Encounter Time as defined by the Centers for Medicare and Medicaid Services includes, in addition to the face-to-face time of a patient visit (documented in the note above) non-face-to-face time: obtaining and reviewing outside history, ordering and reviewing medications, tests or procedures, care coordination (communications with other health care professionals or caregivers) and documentation in the medical record.

## 2024-07-05 NOTE — Progress Notes (Signed)
 Armond Cape, MD [address] [place] [address]   DIAGNOSIS:  Cancer Staging  Malignant neoplasm of upper-outer quadrant of left breast in female, estrogen receptor negative (HCC) Staging form: Breast, AJCC 8th Edition - Clinical stage from 06/04/2020: Stage IIB (cT2, cN0(f), cM0, G3, ER-, PR-, HER2-) - Signed by Loretha Ash, MD on 01/06/2024 Stage prefix: Initial diagnosis Method of lymph node assessment: Core biopsy Histologic grading system: 3 grade system   SUMMARY OF ONCOLOGIC HISTORY:  38 y.o. Jeanne Evans status post left breast upper outer quadrant biopsy 05/29/2020 for a clinical T2 N0, stage IIB invasive ductal carcinoma, functionally triple negative, with an MIB-1 of 95%.   (1) genetics testing 06/23/2020 through the [facility] Multi-Cancer Panel found no deleterious mutations in AIP, ALK, APC, ATM, AXIN2,BAP1,  BARD1, BLM, BMPR1A, BRCA1, BRCA2, BRIP1, CASR, CDC73, CDH1, CDK4, CDKN1B, CDKN1C, CDKN2A (p14ARF), CDKN2A (p16INK4a), CEBPA, CHEK2, CTNNA1, DICER1, DIS3L2, EGFR (c.2369C>T, p.Thr790Met variant only), EPCAM (Deletion/duplication testing only), FH, FLCN, GATA2, GPC3, GREM1 (Promoter region deletion/duplication testing only), HOXB13 (c.251G>A, p.Gly84Glu), HRAS, KIT, MAX, MEN1, MET, MITF (c.952G>A, p.Glu318Lys variant only), MLH1, MSH2, MSH3, MSH6, MUTYH, NBN, NF1, NF2, NTHL1, PALB2, PDGFRA, PHOX2B, PMS2, POLD1, POLE, POT1, PRKAR1A, PTCH1, PTEN, RAD50, RAD51C, RAD51D, RB1, RECQL4, RET, RNF43, RUNX1, SDHAF2, SDHA (sequence changes only), SDHB, SDHC, SDHD, SMAD4, SMARCA4, SMARCB1, SMARCE1, STK11, SUFU, TERC, TERT, TMEM127, TP53, TSC1, TSC2, VHL, WRN and WT1.    (2) neoadjuvant chemotherapy consisting of cyclophosphamide  and doxorubicin  in dose dense fashion x4 started 06/19/2020, completed 08/05/2020, followed by paclitaxel  and carboplatin  weekly x12 starting 08/26/2020, completing 4 doses 09/23/2020             (a) echocardiogram on 06/13/2020 showed an EF  of 60-65%             (b) paclitaxel  and carboplatin  discontinued after 4 doses because of neuropathy and cytopenias   (3) status post left lumpectomy and sentinel lymph node sampling 11/19/2020 for a residual yp T1c ypN0 invasive ductal carcinoma involving the superior margin             (a) additional surgery 12/09/2020 cleared the margin in question             (b) repeat prognostic panel confirms triple negative disease             (c) status post left mastopexy and right breast reduction on 10/27/2021 with fat grafting bilaterally   (4) adjuvant radiation : 01/22/2021 through 03/10/2021 Site Technique Total Dose (Gy) Dose per Fx (Gy) Completed Fx Beam Energies  Breast, Left: Breast_Lt 3D 50.4/50.4 1.8 28/28 6X  Breast, Left: Breast_Lt_Bst 3D 10/10 2 5/5 6X              (a) received capecitabine  sensitization, started 01/26/2021   (5) pembrolizumab /Keytruda  began on 04/16/2021, held 05/11/2021 (after 2 doses) with persistent cough, later attributed to asthma (A) pembrolizumab  resumed 08/06/2021 (B) PD-L1 combined score is 10   (6) molecular pathology from the 11/19/2020 sample 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  There were no mutations in BRCA1 or 2, ERBB2 or PIK3CA.  The microsatellite status was equivocal and the tumor mutational burden could not be determined on the sample.   (7) sarcoidosis? (followed by pulmonary: Ramaswamy)             (A) chest CT 06/13/2020 shows in addition to the left breast mass multiple ill-defined pulmonary nodules, pleural nodularity and symmetrical by hilar and mediastinal adenopathy consistent with sarcoidosis. (B) chest CT  05/29/2021 shows stable mediastinal and hilar adenopathy as well as improved liver lesions.  No evidence of metastatic disease. (C) CT angiogram of the chest on February 02, 2022 shows no evidence of pulmonary embolus and improvement in possible sarcoidosis.  No progressive or new nodularity.  Bronchial wall thickening in the lower lobes which can  be seen with bronchitis or reactive airway disease.  CURRENT THERAPY: observation  INTERVAL HISTORY:  Jeanne Evans 38 y.o. female returns for follow up.    History of Present Illness    Discussed the use of AI scribe software for clinical note transcription with the patient, who gave verbal consent to proceed.  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  She has not experienced any sarcoidosis flare-ups since starting Humira; her eye doctor told her that her eyes are still inflamed. Vision has improved with reduced blurriness and headaches. Current medications include Humira, Cellcept, and eye drops.  She noticed a new growth last week, initially thought to be a mosquito bite, but is unsure of its nature.  She had a recent mammogram and inquires about a referral to a nutritionist, expressing interest in dietary factors related to her past cancer diagnosis.  Breathing has improved following a visit to the asthma and allergy doctor, with better results on her recent breathing test.  No blood in stool or black stools. She has not been performing the guarden reveal test every six months as previously discussed.  Rest of the pertinent 10 point ROS reviewed and neg.  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   Lichen planus 01/21/2023   Gastroesophageal reflux disease without esophagitis 01/21/2023   Other allergic rhinitis 01/21/2023   Port-A-Cath in place 03/11/2022   Acute respiratory failure with hypoxemia (HCC) 02/03/2022   Acute respiratory failure with hypoxia (HCC) 02/03/2022   Not well controlled asthma  without complication 07/31/2021   Lymphedema of left arm 02/02/2021   Drug-induced neutropenia (HCC) 09/30/2020   Genetic testing 06/26/2020   Family history of ovarian cancer    Family history of prostate cancer    Malignant neoplasm of upper-outer quadrant of left breast in female, estrogen receptor negative (HCC) 06/03/2020    is allergic to sulfa antibiotics.  MEDICAL HISTORY: Past Medical History:  Diagnosis Date   Asthma 07/31/2021   Breast cancer (HCC)    Family history of ovarian cancer    Family history of prostate cancer    GERD (gastroesophageal reflux disease)    History of asthma    has albuterol , uses PRN   History of heart murmur in childhood    History of multiple allergies     SURGICAL HISTORY: Past Surgical History:  Procedure Laterality Date   BREAST LUMPECTOMY Left 11/2020   BREAST LUMPECTOMY WITH RADIOACTIVE SEED AND SENTINEL LYMPH NODE BIOPSY Left 11/19/2020   Procedure: LEFT BREAST LUMPECTOMY X 2 WITH RADIOACTIVE SEED AND SENTINEL LYMPH NODE MAPPING;  Surgeon: Vanderbilt Ned, MD;  Location: [facility];  Service: General;  Laterality: Left;  PECTORAL BLOCK   BREAST REDUCTION SURGERY Right 10/27/2021   Procedure: MAMMARY REDUCTION  TO RIGHT BREAST;  Surgeon: Elisabeth,  Craig RAMAN, MD;  Location: [facility];  Service: Plastics;  Laterality: Right;   LIPOSUCTION WITH LIPOFILLING Bilateral 10/27/2021   Procedure: BILATERAL FAT GRAFTING TO BREAST;  Surgeon: Elisabeth Craig RAMAN, MD;  Location: [facility];  Service: Plastics;  Laterality: Bilateral;   PORTACATH PLACEMENT Right 06/18/2020   Procedure: INSERTION PORT-A-CATH WITH ULTRASOUND GUIDANCE;  Surgeon: Vanderbilt Ned, MD;  Location: [facility];  Service: General;  Laterality: Right;   RE-EXCISION OF BREAST LUMPECTOMY Left 12/09/2020   Procedure: RE-EXCISION OF LEFT BREAST LUMPECTOMY;  Surgeon: Vanderbilt Ned, MD;  Location: [facility];   Service: General;  Laterality: Left;   REDUCTION MAMMAPLASTY Right     SOCIAL HISTORY: Social History   Socioeconomic History   Marital status: Single    Spouse name: Not on file   Number of children: 2   Years of education: 12   Highest education level: Not on file  Occupational History   Not on file  Tobacco Use   Smoking status: Never    Passive exposure: Never   Smokeless tobacco: Never  Vaping Use   Vaping status: Never Used  Substance and Sexual Activity   Alcohol use: Yes    Comment: socially   Drug use: Yes    Types: Marijuana    Comment: occass   Sexual activity: Not on file  Other Topics Concern   Not on file  Social History Narrative   Right handed   Caffeine use: drinks about 1 cup coffee every morning (5 days per week)   Social Drivers of Health   Financial Resource Strain: High Risk (09/18/2021)   Overall Financial Resource Strain (CARDIA)    Difficulty of Paying Living Expenses: Very hard  Food Insecurity: Food Insecurity Present (09/18/2021)   Hunger Vital Sign    Worried About Running Out of Food in the Last Year: Sometimes true    Ran Out of Food in the Last Year: Sometimes true  Transportation Needs: No Transportation Needs (09/18/2021)   PRAPARE - Administrator, Civil Service (Medical): No    Lack of Transportation (Non-Medical): No  Physical Activity: Not on file  Stress: Stress Concern Present (09/18/2021)   [employer] of Occupational Health - Occupational Stress Questionnaire    Feeling of Stress : Very much  Social Connections: Unknown (12/31/2022)   Received from [facility]   Social Network    Social Network: Not on file  Intimate Partner Violence: Unknown (12/31/2022)   Received from [facility]   HITS    Physically Hurt: Not on file    Insult or Talk Down To: Not on file    Threaten Physical Harm: Not on file    Scream or Curse: Not on file    FAMILY HISTORY: Family History  Problem Relation Age of Onset    Asthma Mother    High blood pressure Mother    High blood pressure Father    High Cholesterol Father    Asthma Brother    Ovarian cancer Paternal Grandmother        dx 77s   Prostate cancer Paternal Grandfather        dx 60s   Lung cancer Maternal Uncle        dx late 14s   Prostate cancer Paternal Uncle        dx late 60s   Prostate cancer Paternal Uncle        dx early 31s    Review  of Systems  Constitutional:  Negative for appetite change, chills, fatigue, fever and unexpected weight change.  HENT:   Negative for hearing loss, lump/mass, mouth sores, sore throat and trouble swallowing.   Eyes:  Positive for eye problems. Negative for icterus.  Respiratory:  Negative for chest tightness, cough and shortness of breath.   Cardiovascular:  Negative for chest pain, leg swelling and palpitations.  Gastrointestinal:  Negative for abdominal distention, abdominal pain, constipation, diarrhea, nausea and vomiting.  Endocrine: Negative for hot flashes.  Genitourinary:  Negative for difficulty urinating.   Musculoskeletal:  Negative for arthralgias.  Skin:  Negative for itching and rash.  Neurological:  Negative for dizziness, extremity weakness, headaches and numbness.  Hematological:  Negative for adenopathy. Does not bruise/bleed easily.  Psychiatric/Behavioral:  Negative for depression. The patient is not nervous/anxious.       PHYSICAL EXAMINATION  ECOG PERFORMANCE STATUS: 1 - Symptomatic but completely ambulatory  Vitals:   07/05/24 0933  BP: 121/80  Pulse: 69  Resp: 18  Temp: 98 F (36.7 C)  SpO2: 100%    Physical Exam Constitutional:      Appearance: Normal appearance.  HENT:     Head: Normocephalic and atraumatic.  Cardiovascular:     Rate and Rhythm: Normal rate and regular rhythm.     Pulses: Normal pulses.     Heart sounds: Normal heart sounds.  Pulmonary:     Effort: Pulmonary effort is normal.     Breath sounds: Normal breath sounds.  Chest:      Comments: Bilateral breast examined.  No palpable masses or regional adenopathy Musculoskeletal:        General: No swelling.     Cervical back: No rigidity.  Lymphadenopathy:     Cervical: No cervical adenopathy.  Skin:    General: Skin is warm and dry.  Neurological:     Mental Status: She is alert.     LABORATORY DATA:  CBC    Component Value Date/Time   WBC 4.6 04/24/2024 1016   WBC 4.5 10/09/2023 1359   RBC 4.45 04/24/2024 1016   HGB 13.1 04/24/2024 1016   HGB 12.6 06/04/2022 0928   HCT 38.1 04/24/2024 1016   HCT 40.1 06/04/2022 0928   PLT 253 04/24/2024 1016   PLT 290 06/04/2022 0928   MCV 85.6 04/24/2024 1016   MCV 88 06/04/2022 0928   MCH 29.4 04/24/2024 1016   MCHC 34.4 04/24/2024 1016   RDW 14.4 04/24/2024 1016   RDW 14.5 06/04/2022 0928   LYMPHSABS 1.7 04/24/2024 1016   LYMPHSABS 2.3 06/04/2022 0928   MONOABS 0.4 04/24/2024 1016   EOSABS 0.1 04/24/2024 1016   EOSABS 0.5 (H) 06/04/2022 0928   BASOSABS 0.0 04/24/2024 1016   BASOSABS 0.0 06/04/2022 0928    CMP     Component Value Date/Time   NA 141 04/24/2024 1016   K 3.8 04/24/2024 1016   CL 106 04/24/2024 1016   CO2 29 04/24/2024 1016   GLUCOSE 107 (H) 04/24/2024 1016   BUN 11 04/24/2024 1016   CREATININE 0.92 04/24/2024 1016   CALCIUM 9.3 04/24/2024 1016   PROT 7.4 04/24/2024 1016   ALBUMIN 4.4 04/24/2024 1016   AST 19 04/24/2024 1016   ALT 22 04/24/2024 1016   ALKPHOS 77 04/24/2024 1016   BILITOT 0.6 04/24/2024 1016   GFRNONAA >60 04/24/2024 1016   GFRAA >60 08/05/2020 0955   GFRAA >60 06/04/2020 0830    ASSESSMENT and THERAPY PLAN:   Malignant neoplasm of upper-outer  quadrant of left breast in female, estrogen receptor negative (HCC) Assessment and Plan Assessment & Plan 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 Ophthalmologist confirmed ocular symptoms consistent with  sarcoidosis. - Continue Humira and Cellcept per opthalmology  History of breast cancer Recent mammogram normal, no lumps detected.  No concerns on ROS and PE. Explained that breast cancer is not typically linked to a single dietary factor. - Provide referral to a nutritionist.  Follow-Up Follow-up scheduled to monitor conditions and treatment efficacy.   Total time spent: 30 min  *Total Encounter Time as defined by the Centers for Medicare and Medicaid Services includes, in addition to the face-to-face time of a patient visit (documented in the note above) non-face-to-face time: obtaining and reviewing outside history, ordering and reviewing medications, tests or procedures, care coordination (communications with other health care professionals or caregivers) and documentation in the medical record.

## 2024-07-12 ENCOUNTER — Telehealth: Payer: Self-pay | Admitting: Dietician

## 2024-07-12 NOTE — Telephone Encounter (Signed)
 Patient requested a nutrition consult. First attempt to reach. Patient couldn't talk at this time. Set appointment for next week. Provided my cell# in text if the nutrition consult needed to be rescheduled.  Micheline Craven, RDN, LDN Registered Dietitian, [facility] Part Time Remote (Usual office hours: Tuesday-Thursday) Cell: [phone]

## 2024-07-17 ENCOUNTER — Inpatient Hospital Stay: Attending: Hematology and Oncology | Admitting: Dietician

## 2024-07-17 NOTE — Progress Notes (Signed)
 Nutrition Assessment: Reached out to patient at home telephone number.    Reason for Assessment: Patient requested  ASSESSMENT: Patient is 38 year-old female under surveillance after treatment for triple negative breast cancer. . Wants to eat better, risk reduction and weight loss. On the go but not exercising regularly was walking 15-20 minutes on treadmill then weight bearing exercise. She didn't see much results and no long making time to continue.  Usual PO: Breakfast: coffee or a piece of toast with cream cheese or fruit, often skips Lunch: sometimes skips,or eats out [place] 6 inch sub, or [place] salad, Chick filet tries to make healthier choices Dinner: meat & rice, squeezes a vegetable in but her kids are picky(greens, broccoli, green beans)  Snacking: chips, nuts (got tired of these lately) Fluids: Coffee 1-.5 (oatmeal creamer, 3 tablespoons sugar), juice 2-3 cups, Water, eats a lot of ice, doesn't drink milk, alcohol   Labs: none since 04/24/24   Anthropometrics: weight up 3# past month  Height: 62 Weight: 203.7# UBW: increasing BMI: 37.28   Estimated Energy Needs for weight loss  Kcals: 1800 Protein: 92-110 Fluid: 3 L   NUTRITION DIAGNOSIS: Food and Nutrition Related Knowledge Deficit related to cancer risk reduction and weight loss     INTERVENTION:   Educated on importance of adequate nourishment with protein and energy intake with nutrient dense foods when possible to maintain lean body mass  Discussed strategies for protein pacing Encouraged limiting sugary beverages and eliminating alcohol. Encouraged use of My fitness pal ap to monitor PO intake. Relayed Physical activity guidelines for health and increase for weight loss. Encouraged attending Virtual Nutrition class in future for refresher as needed. Emailed Nutrition Tip sheet for Nutrition After Cancer and AICR guidelines for risk reduction, with flyer for virtual nutrition class with contact information  provided.  MONITORING, EVALUATION, GOAL: weight trends, nutrition impact symptoms, PO intake, labs  Goal is weight loss and preserved LBM  Next Visit: PRN at patient or provider request.  Micheline Craven, RDN, LDN Registered Dietitian, [facility] Part Time Remote (Usual office hours: Tuesday-Thursday) Mobile: [phone]

## 2024-07-18 ENCOUNTER — Ambulatory Visit (INDEPENDENT_AMBULATORY_CARE_PROVIDER_SITE_OTHER)

## 2024-07-18 DIAGNOSIS — J4550 Severe persistent asthma, uncomplicated: Secondary | ICD-10-CM

## 2024-08-01 ENCOUNTER — Ambulatory Visit

## 2024-08-01 DIAGNOSIS — J4550 Severe persistent asthma, uncomplicated: Secondary | ICD-10-CM

## 2024-08-07 ENCOUNTER — Other Ambulatory Visit: Payer: Self-pay | Admitting: Internal Medicine

## 2024-08-15 ENCOUNTER — Ambulatory Visit (INDEPENDENT_AMBULATORY_CARE_PROVIDER_SITE_OTHER)

## 2024-08-15 DIAGNOSIS — J4550 Severe persistent asthma, uncomplicated: Secondary | ICD-10-CM | POA: Diagnosis not present

## 2024-08-29 ENCOUNTER — Ambulatory Visit (INDEPENDENT_AMBULATORY_CARE_PROVIDER_SITE_OTHER)

## 2024-08-29 DIAGNOSIS — J4550 Severe persistent asthma, uncomplicated: Secondary | ICD-10-CM

## 2024-09-12 ENCOUNTER — Ambulatory Visit

## 2024-09-12 DIAGNOSIS — J4550 Severe persistent asthma, uncomplicated: Secondary | ICD-10-CM | POA: Diagnosis not present

## 2024-09-26 ENCOUNTER — Ambulatory Visit

## 2024-09-26 DIAGNOSIS — J4550 Severe persistent asthma, uncomplicated: Secondary | ICD-10-CM

## 2024-09-28 ENCOUNTER — Other Ambulatory Visit: Payer: Self-pay

## 2024-09-28 ENCOUNTER — Ambulatory Visit: Admission: RE | Admit: 2024-09-28 | Discharge: 2024-09-28 | Disposition: A | Discharge status: Home / Self Care

## 2024-09-28 VITALS — BP 126/81 | HR 102 | Temp 101.0°F | Resp 19 | Ht 62.0 in | Wt 200.0 lb

## 2024-09-28 DIAGNOSIS — S39011A Strain of muscle, fascia and tendon of abdomen, initial encounter: Secondary | ICD-10-CM

## 2024-09-28 DIAGNOSIS — B349 Viral infection, unspecified: Secondary | ICD-10-CM | POA: Diagnosis not present

## 2024-09-28 LAB — POCT URINE DIPSTICK
Bilirubin, UA: NEGATIVE
Glucose, UA: NEGATIVE mg/dL
Ketones, POC UA: NEGATIVE mg/dL
Leukocytes, UA: NEGATIVE
Nitrite, UA: NEGATIVE
POC PROTEIN,UA: 30 — AB
Spec Grav, UA: 1.02 (ref 1.010–1.025)
Urobilinogen, UA: >=8 U/dL — AB
pH, UA: 6.5 (ref 5.0–8.0)

## 2024-09-28 LAB — POC COVID19/FLU A&B COMBO
Covid Antigen, POC: NEGATIVE
Influenza A Antigen, POC: NEGATIVE
Influenza B Antigen, POC: NEGATIVE

## 2024-09-28 LAB — POCT URINE PREGNANCY: Preg Test, Ur: NEGATIVE

## 2024-09-28 MED ORDER — IBUPROFEN 800 MG PO TABS: 800.0000 mg | ORAL_TABLET | Freq: Three times a day (TID) | ORAL | 0 refills | Status: AC

## 2024-09-28 MED ORDER — ACETAMINOPHEN 325 MG PO TABS
975.0000 mg | ORAL_TABLET | Freq: Once | ORAL | Status: AC
  Administered 2024-09-28: 975 mg via ORAL

## 2024-09-28 MED ORDER — BACLOFEN 10 MG PO TABS: 10.0000 mg | ORAL_TABLET | Freq: Three times a day (TID) | ORAL | 0 refills | Status: AC

## 2024-09-28 NOTE — Discharge Instructions (Addendum)
  1. Acute viral syndrome (Primary) - POC Covid19/Flu A&B Antigen complete in UC is negative for COVID and influenza - acetaminophen  (TYLENOL ) tablet 975 mg given in UC for fever - ibuprofen  (ADVIL ) 800 MG tablet; Take 1 tablet (800 mg total) by mouth 3 (three) times daily.  Dispense: 30 tablet; Refill: 0  2. Abdominal muscle strain, initial encounter - POCT urine pregnancy complaint UC is negative - POCT URINE DIPSTICK completed in UC is negative for UTI as a cause of symptoms. - baclofen (LIORESAL) 10 MG tablet; Take 1 tablet (10 mg total) by mouth 3 (three) times daily.  Dispense: 30 each; Refill: 0 -Continue to monitor symptoms for any change in severity if there is any escalation of current symptoms or development of new symptoms follow-up in ER for further evaluation and management.

## 2024-09-28 NOTE — ED Provider Notes (Addendum)
 [facility]  Note:  This document was prepared using Dragon voice recognition software and may include unintentional dictation errors.  MRN: [id number] DOB: 1986/06/28  Subjective:   Jeanne Evans is a 38 y.o. female presenting for fever/chills, left-sided flank pain, body aches, fatigue, weakness, loss of appetite since yesterday.  Patient rates pain as a 9/10 describes it as aching all over.  Patient reports symptoms improve when laying down pain is somewhat relieved.  Patient denies taking any over-the-counter medication prior to arrival in urgent care today.  Patient denies any urinary discomfort, increased frequency, abdominal pain.  Patient denies any known sick contacts.  No shortness of breath, chest pain, dizziness.   Current Facility-Administered Medications:    dupilumab  (DUPIXENT ) prefilled syringe 300 mg, 300 mg, Subcutaneous, Q14 Days, Padgett, Danita Macintosh, MD, 300 mg at 09/26/24 9088  Current Outpatient Medications:    baclofen (LIORESAL) 10 MG tablet, Take 1 tablet (10 mg total) by mouth 3 (three) times daily., Disp: 30 each, Rfl: 0   ibuprofen  (ADVIL ) 800 MG tablet, Take 1 tablet (800 mg total) by mouth 3 (three) times daily., Disp: 30 tablet, Rfl: 0   Acetaminophen  325 MG CAPS, Take by mouth., Disp: , Rfl:    adalimumab (HUMIRA, 2 PEN,) 40 MG/0.4ML pen, Inject 40 mg into the skin every 14 (fourteen) days., Disp: , Rfl:    albuterol  (VENTOLIN  HFA) 108 (90 Base) MCG/ACT inhaler, Inhale 2 puffs into the lungs every 6 (six) hours as needed for wheezing or shortness of breath., Disp: 6.7 g, Rfl: 1   azaTHIOprine (IMURAN) 50 MG tablet, Take 50 mg by mouth daily., Disp: , Rfl:    azelastine  (ASTELIN ) 0.1 % nasal spray, Place 2 sprays into both nostrils 2 (two) times daily. Use in each nostril as directed, Disp: 30 mL, Rfl: 6   clobetasol  ointment (TEMOVATE ) 0.05 %, Apply 1 Application topically 2 (two) times daily., Disp: 30 g, Rfl: 0    dorzolamide-timolol (COSOPT) 2-0.5 % ophthalmic solution, Place 1 drop into both eyes 2 (two) times daily., Disp: , Rfl:    DUPIXENT  300 MG/2ML prefilled syringe, INJECT 1 SYRINGE SUBCUTANEOUSLY  EVERY OTHER WEEK, Disp: 4 mL, Rfl: 11   fluticasone  (FLONASE ) 50 MCG/ACT nasal spray, Place 2 sprays into both nostrils daily., Disp: 16 g, Rfl: 2   Fluticasone -Umeclidin-Vilant (TRELEGY ELLIPTA ) 200-62.5-25 MCG/ACT AEPB, Inhale 1 puff into the lungs daily., Disp: 1 each, Rfl: 6   ipratropium (ATROVENT ) 0.06 % nasal spray, 1 spray each nostril up to 3 times daily as needed for runny nose., Disp: 15 mL, Rfl: 5   levonorgestrel (MIRENA, 52 MG,) 20 MCG/DAY IUD, by Intrauterine route., Disp: , Rfl:    montelukast  (SINGULAIR ) 10 MG tablet, Take 10 mg by mouth as needed., Disp: , Rfl:    naphazoline-pheniramine (NAPHCON-A) 0.025-0.3 % ophthalmic solution, Place 1 drop into both eyes 4 (four) times daily as needed., Disp: , Rfl:    ofloxacin (OCUFLOX) 0.3 % ophthalmic solution, Place 1 drop into both eyes 4 (four) times daily., Disp: , Rfl:    omeprazole  (PRILOSEC) 40 MG capsule, Take 1 capsule (40 mg total) by mouth daily. First thing in the morning on an empty stomach, Disp: 30 capsule, Rfl: 3   pimecrolimus  (ELIDEL ) 1 % cream, Apply topically 2 (two) times daily., Disp: 100 g, Rfl: 5   prednisoLONE acetate (PRED FORTE) 1 % ophthalmic suspension, Apply to eye., Disp: , Rfl:    prednisoLONE acetate (PRED FORTE) 1 % ophthalmic suspension, Apply  1 drop to eye 4 (four) times daily., Disp: , Rfl:    rizatriptan  (MAXALT ) 10 MG tablet, Take 1 tablet (10 mg total) by mouth as needed for migraine. May repeat in 2 hours if needed, Disp: 10 tablet, Rfl: 11   tacrolimus  (PROTOPIC ) 0.1 % ointment, Apply topically 2 (two) times daily., Disp: 100 g, Rfl: 5   traMADol  (ULTRAM ) 50 MG tablet, Take 1 tablet (50 mg total) by mouth every 6 (six) hours as needed., Disp: 15 tablet, Rfl: 0   Allergies  Allergen Reactions   Sulfa  Antibiotics Hives    Past Medical History:  Diagnosis Date   Asthma 07/31/2021   Breast cancer (HCC)    Family history of ovarian cancer    Family history of prostate cancer    GERD (gastroesophageal reflux disease)    History of asthma    has albuterol , uses PRN   History of heart murmur in childhood    History of multiple allergies      Past Surgical History:  Procedure Laterality Date   BREAST LUMPECTOMY Left 11/2020   BREAST LUMPECTOMY WITH RADIOACTIVE SEED AND SENTINEL LYMPH NODE BIOPSY Left 11/19/2020   Procedure: LEFT BREAST LUMPECTOMY X 2 WITH RADIOACTIVE SEED AND SENTINEL LYMPH NODE MAPPING;  Surgeon: Vanderbilt Ned, MD;  Location: [facility];  Service: General;  Laterality: Left;  PECTORAL BLOCK   BREAST REDUCTION SURGERY Right 10/27/2021   Procedure: MAMMARY REDUCTION  TO RIGHT BREAST;  Surgeon: Elisabeth Craig RAMAN, MD;  Location: [facility];  Service: Plastics;  Laterality: Right;   LIPOSUCTION WITH LIPOFILLING Bilateral 10/27/2021   Procedure: BILATERAL FAT GRAFTING TO BREAST;  Surgeon: Elisabeth Craig RAMAN, MD;  Location: [facility];  Service: Plastics;  Laterality: Bilateral;   PORTACATH PLACEMENT Right 06/18/2020   Procedure: INSERTION PORT-A-CATH WITH ULTRASOUND GUIDANCE;  Surgeon: Vanderbilt Ned, MD;  Location: [facility];  Service: General;  Laterality: Right;   RE-EXCISION OF BREAST LUMPECTOMY Left 12/09/2020   Procedure: RE-EXCISION OF LEFT BREAST LUMPECTOMY;  Surgeon: Vanderbilt Ned, MD;  Location:  SURGERY CENTER;  Service: General;  Laterality: Left;   REDUCTION MAMMAPLASTY Right     Family History  Problem Relation Age of Onset   Asthma Mother    High blood pressure Mother    High blood pressure Father    High Cholesterol Father    Asthma Brother    Ovarian cancer Paternal Grandmother        dx 44s   Prostate cancer Paternal Grandfather        dx 4s   Lung cancer Maternal Uncle         dx late 34s   Prostate cancer Paternal Uncle        dx late 48s   Prostate cancer Paternal Uncle        dx early 105s    Social History   Tobacco Use   Smoking status: Never    Passive exposure: Never   Smokeless tobacco: Never  Vaping Use   Vaping status: Never Used  Substance Use Topics   Alcohol use: Yes    Comment: socially   Drug use: Yes    Types: Marijuana    Comment: occass    ROS Refer to HPI for ROS details.  Objective:   Vitals: BP 126/81 (BP Location: Right Arm)   Pulse (!) 102   Temp (!) 101 F (38.3 C) (Oral)   Resp 19   Ht 5' 2 (  1.575 m)   Wt 200 lb (90.7 kg)   SpO2 96%   BMI 36.58 kg/m   Physical Exam Vitals and nursing note reviewed.  Constitutional:      General: She is not in acute distress.    Appearance: Normal appearance. She is well-developed. She is not ill-appearing or toxic-appearing.  HENT:     Head: Normocephalic and atraumatic.     Nose: Nose normal. No congestion or rhinorrhea.     Mouth/Throat:     Mouth: Mucous membranes are moist.  Cardiovascular:     Rate and Rhythm: Normal rate.  Pulmonary:     Effort: Pulmonary effort is normal. No respiratory distress.  Abdominal:     Tenderness: There is abdominal tenderness in the left upper quadrant and left lower quadrant.   Skin:    General: Skin is warm and dry.  Neurological:     General: No focal deficit present.     Mental Status: She is alert and oriented to person, place, and time.  Psychiatric:        Mood and Affect: Mood normal.        Behavior: Behavior normal.     Procedures  Results for orders placed or performed during the hospital encounter of 09/28/24 (from the past 24 hours)  POCT urine pregnancy     Status: None   Collection Time: 09/28/24  6:37 PM  Result Value Ref Range   Preg Test, Ur Negative Negative  POCT URINE DIPSTICK     Status: Abnormal   Collection Time: 09/28/24  6:37 PM  Result Value Ref Range   Color, UA yellow yellow   Clarity, UA  clear clear   Glucose, UA negative negative mg/dL   Bilirubin, UA negative negative   Ketones, POC UA negative negative mg/dL   Spec Grav, UA 8.979 8.989 - 1.025   Blood, UA small (A) negative   pH, UA 6.5 5.0 - 8.0   POC PROTEIN,UA =30 (A) negative, trace   Urobilinogen, UA >=8.0 (A) 0.2 or 1.0 E.U./dL   Nitrite, UA Negative Negative   Leukocytes, UA Negative Negative  POC Covid19/Flu A&B Antigen     Status: None   Collection Time: 09/28/24  6:43 PM  Result Value Ref Range   Influenza A Antigen, POC Negative Negative   Influenza B Antigen, POC Negative Negative   Covid Antigen, POC Negative Negative    No results found.   Assessment and Plan :     Discharge Instructions       1. Acute viral syndrome (Primary) - POC Covid19/Flu A&B Antigen complete in UC is negative for COVID and influenza - acetaminophen  (TYLENOL ) tablet 975 mg given in UC for fever - ibuprofen  (ADVIL ) 800 MG tablet; Take 1 tablet (800 mg total) by mouth 3 (three) times daily.  Dispense: 30 tablet; Refill: 0  2. Abdominal muscle strain, initial encounter - POCT urine pregnancy complaint UC is negative - POCT URINE DIPSTICK completed in UC is negative for UTI as a cause of symptoms. - baclofen (LIORESAL) 10 MG tablet; Take 1 tablet (10 mg total) by mouth 3 (three) times daily.  Dispense: 30 each; Refill: 0 -Continue to monitor symptoms for any change in severity if there is any escalation of current symptoms or development of new symptoms follow-up in ER for further evaluation and management.      Harald Quevedo B Kayleanna Lorman   Edvardo Honse, [place] B, NP 09/28/24 1910    Geneva Barrero B, NP 09/28/24 [phone]

## 2024-09-28 NOTE — ED Triage Notes (Signed)
 Pt presents to urgent care with complaints of chills, left flank pain, body aches, increased fatigue/weakness, and loss of appetite. Symptoms began yesterday. Currently rates overall pain a 9/10. Describes as aching. Relief when sitting/lying down. Pain increases with ambulation. No medications taken PTA for symptoms reported. Denies urinary symptoms. Reports her urine is yellow and clear.

## 2024-09-29 ENCOUNTER — Emergency Department (HOSPITAL_COMMUNITY)

## 2024-09-29 ENCOUNTER — Other Ambulatory Visit: Payer: Self-pay

## 2024-09-29 ENCOUNTER — Encounter (HOSPITAL_COMMUNITY): Payer: Self-pay

## 2024-09-29 ENCOUNTER — Emergency Department (HOSPITAL_COMMUNITY)
Admission: EM | Admit: 2024-09-29 | Discharge: 2024-09-30 | Disposition: A | Discharge status: Home / Self Care | Attending: Emergency Medicine | Admitting: Emergency Medicine

## 2024-09-29 DIAGNOSIS — M79602 Pain in left arm: Secondary | ICD-10-CM | POA: Insufficient documentation

## 2024-09-29 DIAGNOSIS — R10A2 Flank pain, left side: Secondary | ICD-10-CM | POA: Diagnosis present

## 2024-09-29 DIAGNOSIS — N3000 Acute cystitis without hematuria: Secondary | ICD-10-CM | POA: Insufficient documentation

## 2024-09-29 DIAGNOSIS — R509 Fever, unspecified: Secondary | ICD-10-CM | POA: Diagnosis not present

## 2024-09-29 LAB — CBC WITH DIFFERENTIAL/PLATELET
Abs Immature Granulocytes: 0.02 K/uL (ref 0.00–0.07)
Basophils Absolute: 0 K/uL (ref 0.0–0.1)
Basophils Relative: 0 %
Eosinophils Absolute: 0 K/uL (ref 0.0–0.5)
Eosinophils Relative: 0 %
HCT: 39.9 % (ref 36.0–46.0)
Hemoglobin: 13.4 g/dL (ref 12.0–15.0)
Immature Granulocytes: 0 %
Lymphocytes Relative: 19 %
Lymphs Abs: 1.8 K/uL (ref 0.7–4.0)
MCH: 28.8 pg (ref 26.0–34.0)
MCHC: 33.6 g/dL (ref 30.0–36.0)
MCV: 85.6 fL (ref 80.0–100.0)
Monocytes Absolute: 0.8 K/uL (ref 0.1–1.0)
Monocytes Relative: 9 %
Neutro Abs: 6.7 K/uL (ref 1.7–7.7)
Neutrophils Relative %: 72 %
Platelets: 269 K/uL (ref 150–400)
RBC: 4.66 MIL/uL (ref 3.87–5.11)
RDW: 14.3 % (ref 11.5–15.5)
WBC: 9.3 K/uL (ref 4.0–10.5)
nRBC: 0 % (ref 0.0–0.2)

## 2024-09-29 LAB — COMPREHENSIVE METABOLIC PANEL WITH GFR
ALT: 14 U/L (ref 0–44)
AST: 15 U/L (ref 15–41)
Albumin: 4.4 g/dL (ref 3.5–5.0)
Alkaline Phosphatase: 82 U/L (ref 38–126)
Anion gap: 12 (ref 5–15)
BUN: 10 mg/dL (ref 6–20)
CO2: 23 mmol/L (ref 22–32)
Calcium: 9.6 mg/dL (ref 8.9–10.3)
Chloride: 101 mmol/L (ref 98–111)
Creatinine, Ser: 0.91 mg/dL (ref 0.44–1.00)
GFR, Estimated: >60 mL/min (ref >60)
Glucose, Bld: 125 mg/dL — ABNORMAL HIGH (ref 70–99)
Potassium: 3.9 mmol/L (ref 3.5–5.1)
Sodium: 135 mmol/L (ref 135–145)
Total Bilirubin: 1.1 mg/dL (ref 0.0–1.2)
Total Protein: 8.4 g/dL — ABNORMAL HIGH (ref 6.5–8.1)

## 2024-09-29 LAB — HCG, SERUM, QUALITATIVE: Preg, Serum: NEGATIVE

## 2024-09-29 NOTE — ED Triage Notes (Signed)
 Pt reports fever, body aches and lack of appetite. She went to urgent care yesterday for these symptoms and was told she might have an infection somewhere. She was negative for covid and flu.  She also reports left arm pain, onset today it hurts all the way down to my bones. Worse at her elbow. Denies injury.

## 2024-09-30 ENCOUNTER — Emergency Department (HOSPITAL_COMMUNITY)

## 2024-09-30 ENCOUNTER — Telehealth (HOSPITAL_COMMUNITY): Payer: Self-pay | Admitting: Emergency Medicine

## 2024-09-30 DIAGNOSIS — N3000 Acute cystitis without hematuria: Secondary | ICD-10-CM | POA: Diagnosis not present

## 2024-09-30 LAB — URINALYSIS, W/ REFLEX TO CULTURE (INFECTION SUSPECTED)
Bilirubin Urine: NEGATIVE
Glucose, UA: NEGATIVE mg/dL
Ketones, ur: NEGATIVE mg/dL
Leukocytes,Ua: NEGATIVE
Nitrite: POSITIVE — AB
Protein, ur: 30 mg/dL — AB
Specific Gravity, Urine: 1.018 (ref 1.005–1.030)
pH: 5 (ref 5.0–8.0)

## 2024-09-30 LAB — RESP PANEL BY RT-PCR (RSV, FLU A&B, COVID)  RVPGX2
Influenza A by PCR: NEGATIVE
Influenza B by PCR: NEGATIVE
Resp Syncytial Virus by PCR: NEGATIVE
SARS Coronavirus 2 by RT PCR: NEGATIVE

## 2024-09-30 MED ORDER — LACTATED RINGERS IV BOLUS
1000.0000 mL | Freq: Once | INTRAVENOUS | Status: AC
  Administered 2024-09-30: 1000 mL via INTRAVENOUS

## 2024-09-30 MED ORDER — ONDANSETRON 4 MG PO TBDP: 4.0000 mg | ORAL_TABLET | Freq: Three times a day (TID) | ORAL | 0 refills | Status: AC | PRN

## 2024-09-30 MED ORDER — OXYCODONE-ACETAMINOPHEN 5-325 MG PO TABS: 1.0000 | ORAL_TABLET | Freq: Four times a day (QID) | ORAL | 0 refills | Status: AC | PRN

## 2024-09-30 MED ORDER — SODIUM CHLORIDE 0.9 % IV SOLN
1.0000 g | Freq: Once | INTRAVENOUS | Status: AC
  Administered 2024-09-30: 1 g via INTRAVENOUS
  Filled 2024-09-30: qty 10

## 2024-09-30 MED ORDER — CEPHALEXIN 500 MG PO CAPS: 500.0000 mg | ORAL_CAPSULE | Freq: Two times a day (BID) | ORAL | 0 refills | Status: DC

## 2024-09-30 MED ORDER — HYDROMORPHONE HCL 1 MG/ML IJ SOLN
0.5000 mg | Freq: Once | INTRAMUSCULAR | Status: AC
  Administered 2024-09-30: 0.5 mg via INTRAVENOUS
  Filled 2024-09-30: qty 1

## 2024-09-30 MED ORDER — ONDANSETRON HCL 4 MG/2ML IJ SOLN
4.0000 mg | Freq: Once | INTRAMUSCULAR | Status: AC
  Administered 2024-09-30: 4 mg via INTRAVENOUS
  Filled 2024-09-30: qty 2

## 2024-09-30 MED ORDER — OXYCODONE-ACETAMINOPHEN 5-325 MG PO TABS: 1.0000 | ORAL_TABLET | Freq: Four times a day (QID) | ORAL | 0 refills | Status: DC | PRN

## 2024-09-30 NOTE — Telephone Encounter (Cosign Needed)
 Transmission error with RX.  Pharmacy switched and resent.

## 2024-09-30 NOTE — ED Provider Notes (Signed)
 [facility] Emergency Department Provider Note MRN:  [id number]  Arrival date & time: 09/30/24     Chief Complaint   Arm Pain   History of Present Illness   Jeanne Evans is a 38 y.o. year-old female presents to the ED with chief complaint of fevers, chills, generalized bodyaches.  She states that she feels pain down to her bones.  She states that she has some left sided abdominal pain that radiates into her flank.  She also reports some left arm pain.  Denies any injury or trauma.  Denies cough or cold symptoms.  Denies any successful treatments prior to arrival.  Was seen at urgent care and had negative COVID and flu testing.SABRA  History provided by patient.   Review of Systems  Pertinent positive and negative review of systems noted in HPI.    Physical Exam   Vitals:   09/29/24 2300 09/30/24 0000  BP: (!) 173/102 138/84  Pulse: (!) 110 92  Resp: 20 16  Temp: 99.8 F (37.7 C)   SpO2: 97% 98%    CONSTITUTIONAL:  non toxic-appearing, NAD NEURO:  Alert and oriented x 3, CN 3-12 grossly intact EYES:  eyes equal and reactive ENT/NECK:  Supple, no stridor  CARDIO:  tachycardic, regular rhythm, appears well-perfused  PULM:  No respiratory distress, moderate LLQ TTP GI/GU:  non-distended,  MSK/SPINE:  No gross deformities, no edema, moves all extremities  SKIN:  no rash, atraumatic   *Additional and/or pertinent findings included in MDM below  Diagnostic and Interventional Summary    EKG Interpretation Date/Time:    Ventricular Rate:    PR Interval:    QRS Duration:    QT Interval:    QTC Calculation:   R Axis:      Text Interpretation:         Labs Reviewed  COMPREHENSIVE METABOLIC PANEL WITH GFR - Abnormal; Notable for the following components:      Result Value   Glucose, Bld 125 (*)    Total Protein 8.4 (*)    All other components within normal limits  URINALYSIS, W/ REFLEX TO CULTURE (INFECTION SUSPECTED) - Abnormal; Notable  for the following components:   APPearance HAZY (*)    Hgb urine dipstick MODERATE (*)    Protein, ur 30 (*)    Nitrite POSITIVE (*)    Bacteria, UA FEW (*)    All other components within normal limits  RESP PANEL BY RT-PCR (RSV, FLU A&B, COVID)  [id number]  URINE CULTURE  CBC WITH DIFFERENTIAL/PLATELET  HCG, SERUM, QUALITATIVE    CT Renal Stone Study  Final Result    DG Chest 2 View  Final Result      Medications  cefTRIAXone (ROCEPHIN) 1 g in sodium chloride  0.9 % 100 mL IVPB (1 g Intravenous New Bag/Given 09/30/24 0122)  lactated ringers  bolus 1,000 mL (1,000 mLs Intravenous New Bag/Given 09/30/24 0120)  HYDROmorphone  (DILAUDID ) injection 0.5 mg (0.5 mg Intravenous Given 09/30/24 0123)  ondansetron  (ZOFRAN ) injection 4 mg (4 mg Intravenous Given 09/30/24 0121)     Procedures  /  Critical Care Procedures  ED Course and Medical Decision Making  I have reviewed the triage vital signs, the nursing notes, and pertinent available records from the EMR.  Social Determinants Affecting Complexity of Care: Patient has no clinically significant social determinants affecting this chief complaint..   ED Course: Clinical Course as of 09/30/24 0156  Sun Sep 30, 2024  0156 Comprehensive metabolic panel(!) No significant electrolyte  abnormality [RB]  0156 CBC with Differential No leukocytosis or anemia [RB]  0156 hCG, serum, qualitative Negative urine pregnancy test [RB]  0156 Urinalysis, w/ Reflex to Culture (Infection Suspected) -Urine, Clean Catch(!) Urinalysis worrisome for infection [RB]  0156 Resp panel by RT-PCR (RSV, Flu A&B, Covid) Anterior Nasal Swab Negative for COVID and flu [RB]  0156 CT Renal Stone Study No evidence of kidney stone or pyelonephritis [RB]    Clinical Course User Index [RB] Vicky Charleston, PA-C    Medical Decision Making Patient here with generalized bodyaches, subjective fevers and chills, left flank pain, left arm pain.  Will check labs.  Lab  workup is worrisome for UTI.  Pyelonephritis is considered, will check CT renal to ensure no evidence of septic stone or [place].  CT is reassuring.  Will treat with Rocephin and fluids.  Patient feeling improved.  Regarding her left arm pain, she has intact distal pulses, there is no swelling, no redness, no wounds.  Will have her follow-up with PCP.  Doubt upper extremity DVT.  Patient reassessed, feeling somewhat better, I think that she is stable for discharge and outpatient follow-up.  Amount and/or Complexity of Data Reviewed Labs: ordered. Decision-making details documented in ED Course. Radiology: ordered. Decision-making details documented in ED Course.  Risk Prescription drug management.         Consultants: No consultations were needed in caring for this patient.   Treatment and Plan: I considered admission due to patient's initial presentation, but after considering the examination and diagnostic results, patient will not require admission and can be discharged with outpatient follow-up.    Final Clinical Impressions(s) / ED Diagnoses     ICD-10-CM   1. Acute cystitis without hematuria  N30.00     2. Left arm pain  M79.602       ED Discharge Orders          Ordered    cephALEXin (KEFLEX) 500 MG capsule  2 times daily        09/30/24 0154    oxyCODONE -acetaminophen  (PERCOCET/ROXICET) 5-325 MG tablet  Every 6 hours PRN        09/30/24 0154    ondansetron  (ZOFRAN -ODT) 4 MG disintegrating tablet  Every 8 hours PRN        09/30/24 0154              Discharge Instructions Discussed with and Provided to Patient:   Discharge Instructions   None      Vicky Charleston, PA-C 09/30/24 0157    Griselda Norris, MD 09/30/24 [phone]

## 2024-10-02 LAB — URINE CULTURE: Culture: >=100000 — AB

## 2024-10-03 ENCOUNTER — Telehealth (HOSPITAL_BASED_OUTPATIENT_CLINIC_OR_DEPARTMENT_OTHER): Payer: Self-pay

## 2024-10-03 NOTE — Telephone Encounter (Signed)
 Post ED Visit - Positive Culture Follow-up  Culture report reviewed by antimicrobial stewardship pharmacist: [facility] []  Rankin Dee, Pharm.D. []  Venetia Gully, Pharm.D., BCPS AQ-ID []  Garrel Crews, Pharm.D., BCPS []  Almarie Lunger, Pharm.D., BCPS []  [place], [address].D., BCPS, AAHIVP []  Rosaline Bihari, Pharm.D., BCPS, AAHIVP []  Vernell Meier, PharmD, BCPS []  Latanya Hint, PharmD, BCPS []  Donald Medley, PharmD, BCPS []  Rocky Bold, PharmD []  Dorothyann Alert, PharmD, BCPS []  Morene Babe, PharmD  [facility] [x]  Prentice Favors, PharmD []  Romona Bliss, PharmD []  Dolphus Roller, PharmD []  Veva Seip, Rph []  Vernell Daunt) Leonce, PharmD []  Eva Allis, PharmD []  Rosaline Millet, PharmD []  Iantha Batch, PharmD []  Arvin Gauss, PharmD []  Wanda Hasting, PharmD []  Ronal Rav, PharmD []  Rocky Slade, PharmD []  Bard Jeans, PharmD   Positive urine culture Treated with Cephalexin, organism sensitive to the same and no further patient follow-up is required at this time.  Jeanne Evans 10/03/2024, 11:53 AM

## 2024-10-10 ENCOUNTER — Ambulatory Visit

## 2024-10-10 DIAGNOSIS — J4550 Severe persistent asthma, uncomplicated: Secondary | ICD-10-CM | POA: Diagnosis not present

## 2024-10-23 ENCOUNTER — Telehealth: Payer: Self-pay

## 2024-10-23 NOTE — Telephone Encounter (Signed)
 Spoke with patient and confirmed appointment on 12/10

## 2024-10-24 ENCOUNTER — Ambulatory Visit: Attending: Hematology and Oncology | Admitting: Hematology and Oncology

## 2024-10-24 VITALS — BP 133/71 | HR 81 | Temp 98.3°F | Resp 17 | Wt 203.2 lb

## 2024-10-24 DIAGNOSIS — Z853 Personal history of malignant neoplasm of breast: Secondary | ICD-10-CM | POA: Insufficient documentation

## 2024-10-24 DIAGNOSIS — N390 Urinary tract infection, site not specified: Secondary | ICD-10-CM | POA: Diagnosis not present

## 2024-10-24 DIAGNOSIS — Z8041 Family history of malignant neoplasm of ovary: Secondary | ICD-10-CM | POA: Diagnosis not present

## 2024-10-24 DIAGNOSIS — Z8042 Family history of malignant neoplasm of prostate: Secondary | ICD-10-CM | POA: Insufficient documentation

## 2024-10-24 DIAGNOSIS — Z923 Personal history of irradiation: Secondary | ICD-10-CM | POA: Diagnosis not present

## 2024-10-24 DIAGNOSIS — Z801 Family history of malignant neoplasm of trachea, bronchus and lung: Secondary | ICD-10-CM | POA: Insufficient documentation

## 2024-10-24 DIAGNOSIS — Z171 Estrogen receptor negative status [ER-]: Secondary | ICD-10-CM | POA: Diagnosis not present

## 2024-10-24 DIAGNOSIS — C50412 Malignant neoplasm of upper-outer quadrant of left female breast: Secondary | ICD-10-CM | POA: Diagnosis not present

## 2024-10-24 DIAGNOSIS — D8689 Sarcoidosis of other sites: Secondary | ICD-10-CM | POA: Insufficient documentation

## 2024-10-24 DIAGNOSIS — Z9221 Personal history of antineoplastic chemotherapy: Secondary | ICD-10-CM | POA: Insufficient documentation

## 2024-10-24 DIAGNOSIS — H209 Unspecified iridocyclitis: Secondary | ICD-10-CM | POA: Diagnosis not present

## 2024-10-24 NOTE — Assessment & Plan Note (Addendum)
 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 Ophthalmologist confirmed ocular symptoms consistent with sarcoidosis. - Continue Humira and Cellcept per opthalmology  History of breast cancer Recent mammogram normal, no lumps detected.  No concerns on ROS and PE. Explained that breast cancer is not typically linked to a single dietary factor. - Provide referral to a nutritionist.  Follow-Up Follow-up scheduled to monitor conditions and treatment efficacy.

## 2024-10-24 NOTE — Progress Notes (Signed)
 Jeanne Cape, MD [address] [place] [address]   DIAGNOSIS:  Cancer Staging  Malignant neoplasm of upper-outer quadrant of left breast in female, estrogen receptor negative (HCC) Staging form: Breast, AJCC 8th Edition - Clinical stage from 06/04/2020: Stage IIB (cT2, cN0(f), cM0, G3, ER-, PR-, HER2-) - Signed by Loretha Ash, MD on 01/06/2024 Stage prefix: Initial diagnosis Method of lymph node assessment: Core biopsy Histologic grading system: 3 grade system   SUMMARY OF ONCOLOGIC HISTORY:  38 y.o. [place] woman status post left breast upper outer quadrant biopsy 05/29/2020 for a clinical T2 N0, stage IIB invasive ductal carcinoma, functionally triple negative, with an MIB-1 of 95%.   (1) genetics testing 06/23/2020 through the [facility] Multi-Cancer Panel found no deleterious mutations in AIP, ALK, APC, ATM, AXIN2,BAP1,  BARD1, BLM, BMPR1A, BRCA1, BRCA2, BRIP1, CASR, CDC73, CDH1, CDK4, CDKN1B, CDKN1C, CDKN2A (p14ARF), CDKN2A (p16INK4a), CEBPA, CHEK2, CTNNA1, DICER1, DIS3L2, EGFR (c.2369C>T, p.Thr790Met variant only), EPCAM (Deletion/duplication testing only), FH, FLCN, GATA2, GPC3, GREM1 (Promoter region deletion/duplication testing only), HOXB13 (c.251G>A, p.Gly84Glu), HRAS, KIT, MAX, MEN1, MET, MITF (c.952G>A, p.Glu318Lys variant only), MLH1, MSH2, MSH3, MSH6, MUTYH, NBN, NF1, NF2, NTHL1, PALB2, PDGFRA, PHOX2B, PMS2, POLD1, POLE, POT1, PRKAR1A, PTCH1, PTEN, RAD50, RAD51C, RAD51D, RB1, RECQL4, RET, RNF43, RUNX1, SDHAF2, SDHA (sequence changes only), SDHB, SDHC, SDHD, SMAD4, SMARCA4, SMARCB1, SMARCE1, STK11, SUFU, TERC, TERT, TMEM127, TP53, TSC1, TSC2, VHL, WRN and WT1.    (2) neoadjuvant chemotherapy consisting of cyclophosphamide  and doxorubicin  in dose dense fashion x4 started 06/19/2020, completed 08/05/2020, followed by paclitaxel  and carboplatin  weekly x12 starting 08/26/2020, completing 4 doses 09/23/2020             (a) echocardiogram on 06/13/2020 showed an EF  of 60-65%             (b) paclitaxel  and carboplatin  discontinued after 4 doses because of neuropathy and cytopenias   (3) status post left lumpectomy and sentinel lymph node sampling 11/19/2020 for a residual yp T1c ypN0 invasive ductal carcinoma involving the superior margin             (a) additional surgery 12/09/2020 cleared the margin in question             (b) repeat prognostic panel confirms triple negative disease             (c) status post left mastopexy and right breast reduction on 10/27/2021 with fat grafting bilaterally   (4) adjuvant radiation : 01/22/2021 through 03/10/2021 Site Technique Total Dose (Gy) Dose per Fx (Gy) Completed Fx Beam Energies  Breast, Left: Breast_Lt 3D 50.4/50.4 1.8 28/28 6X  Breast, Left: Breast_Lt_Bst 3D 10/10 2 5/5 6X              (a) received capecitabine  sensitization, started 01/26/2021   (5) pembrolizumab /Keytruda  began on 04/16/2021, held 05/11/2021 (after 2 doses) with persistent cough, later attributed to asthma (A) pembrolizumab  resumed 08/06/2021 (B) PD-L1 combined score is 10   (6) molecular pathology from the 11/19/2020 sample 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  There were no mutations in BRCA1 or 2, ERBB2 or PIK3CA.  The microsatellite status was equivocal and the tumor mutational burden could not be determined on the sample.   (7) sarcoidosis? (followed by pulmonary: Ramaswamy)             (A) chest CT 06/13/2020 shows in addition to the left breast mass multiple ill-defined pulmonary nodules, pleural nodularity and symmetrical by hilar and mediastinal adenopathy consistent with sarcoidosis. (B) chest CT  05/29/2021 shows stable mediastinal and hilar adenopathy as well as improved liver lesions.  No evidence of metastatic disease. (C) CT angiogram of the chest on February 02, 2022 shows no evidence of pulmonary embolus and improvement in possible sarcoidosis.  No progressive or new nodularity.  Bronchial wall thickening in the lower lobes which can  be seen with bronchitis or reactive airway disease.  CURRENT THERAPY: observation  INTERVAL HISTORY:  Jeanne Evans 38 y.o. female returns for follow up.    History of Present Illness    Discussed the use of AI scribe software for clinical note transcription with the patient, who gave verbal consent to proceed.  History of Present Illness  Jeanne Evans is a 38 year old female with history of triple neg BC,  sarcoidosis and uveitis who presents for follow-up.  She is four years post-diagnosis of ER-negative invasive ductal carcinoma of the left breast, upper-outer quadrant. She denies new breast symptoms, including pain, palpable masses, or skin changes. She notes mild post-surgical breast asymmetry but does not desire further intervention.  Her sarcoidosis remains active with ocular involvement, currently well controlled on adalimumab, with no recent ocular flares or need for surgical intervention. She previously discontinued azathioprine due to significant fatigue. She is also managed for pulmonary sarcoidosis and reports stable respiratory status on dupilumab , with minimal inhaler use.  While on adalimumab, she developed two urinary tract infections, one requiring hospitalization for fever up to 102F, profound weakness, and diffuse body heat without dysuria or other classic urinary symptoms. She attributes this episode in part to dehydration from frequent energy drink consumption. She has not taken her scheduled adalimumab dose for the current week and is in communication with her specialist regarding ongoing management and infection risk.  She requests referral to physical therapy for musculoskeletal discomfort, specifically aching in her arm similar to prior chemotherapy-related bone pain, and prefers a previously utilized facility.  Rest of the pertinent 10 point ROS reviewed and neg.  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   Lichen planus 01/21/2023   Gastroesophageal reflux disease without esophagitis 01/21/2023   Other allergic rhinitis 01/21/2023   Port-A-Cath in place 03/11/2022   Acute respiratory failure with hypoxemia (HCC) 02/03/2022   Acute respiratory failure with hypoxia (HCC) 02/03/2022   Not well controlled asthma without complication 07/31/2021   Lymphedema of left arm 02/02/2021   Drug-induced neutropenia 09/30/2020   Genetic testing 06/26/2020   Family history of ovarian cancer    Family history of prostate cancer    Malignant neoplasm of upper-outer quadrant of left breast in female, estrogen receptor negative (HCC) 06/03/2020    is allergic to sulfa antibiotics.  MEDICAL HISTORY: Past Medical History:  Diagnosis Date   Asthma 07/31/2021   Breast cancer (HCC)    Family history of ovarian cancer    Family history of prostate cancer    GERD (gastroesophageal reflux disease)    History of asthma    has albuterol , uses PRN   History of heart murmur in childhood    History of multiple allergies     SURGICAL HISTORY: Past Surgical History:  Procedure Laterality Date   BREAST LUMPECTOMY Left 11/2020   BREAST LUMPECTOMY WITH RADIOACTIVE SEED AND SENTINEL LYMPH NODE BIOPSY  Left 11/19/2020   Procedure: LEFT BREAST LUMPECTOMY X 2 WITH RADIOACTIVE SEED AND SENTINEL LYMPH NODE MAPPING;  Surgeon: Vanderbilt Ned, MD;  Location: [facility];  Service: General;  Laterality: Left;  PECTORAL BLOCK   BREAST REDUCTION SURGERY Right 10/27/2021   Procedure: MAMMARY REDUCTION  TO RIGHT BREAST;  Surgeon: Elisabeth Craig RAMAN, MD;  Location: [facility];  Service: Plastics;  Laterality: Right;   LIPOSUCTION WITH LIPOFILLING Bilateral 10/27/2021   Procedure: BILATERAL  FAT GRAFTING TO BREAST;  Surgeon: Elisabeth Craig RAMAN, MD;  Location: [facility];  Service: Plastics;  Laterality: Bilateral;   PORTACATH PLACEMENT Right 06/18/2020   Procedure: INSERTION PORT-A-CATH WITH ULTRASOUND GUIDANCE;  Surgeon: Vanderbilt Ned, MD;  Location: [facility];  Service: General;  Laterality: Right;   RE-EXCISION OF BREAST LUMPECTOMY Left 12/09/2020   Procedure: RE-EXCISION OF LEFT BREAST LUMPECTOMY;  Surgeon: Vanderbilt Ned, MD;  Location: [facility];  Service: General;  Laterality: Left;   REDUCTION MAMMAPLASTY Right     SOCIAL HISTORY: Social History   Socioeconomic History   Marital status: Single    Spouse name: Not on file   Number of children: 2   Years of education: 12   Highest education level: Not on file  Occupational History   Not on file  Tobacco Use   Smoking status: Never    Passive exposure: Never   Smokeless tobacco: Never  Vaping Use   Vaping status: Never Used  Substance and Sexual Activity   Alcohol use: Yes    Comment: socially   Drug use: Yes    Types: Marijuana    Comment: occass   Sexual activity: Not on file  Other Topics Concern   Not on file  Social History Narrative   Right handed   Caffeine use: drinks about 1 cup coffee every morning (5 days per week)   Social Drivers of Health   Financial Resource Strain: High Risk (09/18/2021)   Overall Financial Resource Strain (CARDIA)    Difficulty of Paying Living Expenses: Very hard  Food Insecurity: Food Insecurity Present (09/18/2021)   Hunger Vital Sign    Worried About Running Out of Food in the Last Year: Sometimes true    Ran Out of Food in the Last Year: Sometimes true  Transportation Needs: No Transportation Needs (09/18/2021)   PRAPARE - Administrator, Civil Service (Medical): No    Lack of Transportation (Non-Medical): No  Physical Activity: Not on file  Stress: Stress Concern Present (09/18/2021)   [employer]  of Occupational Health - Occupational Stress Questionnaire    Feeling of Stress : Very much  Social Connections: Socially Isolated (09/18/2021)   Social Connection and Isolation Panel    Frequency of Communication with Friends and Family: More than three times a week    Frequency of Social Gatherings with Friends and Family: Once a week    Attends Religious Services: Never    Database Administrator or Organizations: No    Attends Engineer, Structural: Never    Marital Status: Never married  Catering Manager Violence: Not on file    FAMILY HISTORY: Family History  Problem Relation Age of Onset   Asthma Mother    High blood pressure Mother    High blood pressure Father    High Cholesterol Father    Asthma Brother    Ovarian cancer Paternal Grandmother        dx 71s  Prostate cancer Paternal Grandfather        dx 109s   Lung cancer Maternal Uncle        dx late 5s   Prostate cancer Paternal Uncle        dx late 85s   Prostate cancer Paternal Uncle        dx early 65s    Review of Systems  Constitutional:  Negative for appetite change, chills, fatigue, fever and unexpected weight change.  HENT:   Negative for hearing loss, lump/mass, mouth sores, sore throat and trouble swallowing.   Eyes:  Positive for eye problems. Negative for icterus.  Respiratory:  Negative for chest tightness, cough and shortness of breath.   Cardiovascular:  Negative for chest pain, leg swelling and palpitations.  Gastrointestinal:  Negative for abdominal distention, abdominal pain, constipation, diarrhea, nausea and vomiting.  Endocrine: Negative for hot flashes.  Genitourinary:  Negative for difficulty urinating.   Musculoskeletal:  Negative for arthralgias.  Skin:  Negative for itching and rash.  Neurological:  Negative for dizziness, extremity weakness, headaches and numbness.  Hematological:  Negative for adenopathy. Does not bruise/bleed easily.  Psychiatric/Behavioral:  Negative for  depression. The patient is not nervous/anxious.       PHYSICAL EXAMINATION  ECOG PERFORMANCE STATUS: 1 - Symptomatic but completely ambulatory  Vitals:   10/24/24 1415  BP: 133/71  Pulse: 81  Resp: 17  Temp: 98.3 F (36.8 C)  SpO2: 100%    Physical Exam Constitutional:      Appearance: Normal appearance.  HENT:     Head: Normocephalic and atraumatic.  Cardiovascular:     Rate and Rhythm: Normal rate and regular rhythm.     Pulses: Normal pulses.     Heart sounds: Normal heart sounds.  Pulmonary:     Effort: Pulmonary effort is normal.     Breath sounds: Normal breath sounds.  Chest:     Comments: Bilateral breast examined.  No palpable masses or regional adenopathy Musculoskeletal:        General: No swelling.     Cervical back: No rigidity.  Lymphadenopathy:     Cervical: No cervical adenopathy.  Skin:    General: Skin is warm and dry.  Neurological:     Mental Status: She is alert.     LABORATORY DATA:  CBC    Component Value Date/Time   WBC 9.3 09/29/2024 2316   RBC 4.66 09/29/2024 2316   HGB 13.4 09/29/2024 2316   HGB 13.1 04/24/2024 1016   HGB 12.6 06/04/2022 0928   HCT 39.9 09/29/2024 2316   HCT 40.1 06/04/2022 0928   PLT 269 09/29/2024 2316   PLT 253 04/24/2024 1016   PLT 290 06/04/2022 0928   MCV 85.6 09/29/2024 2316   MCV 88 06/04/2022 0928   MCH 28.8 09/29/2024 2316   MCHC 33.6 09/29/2024 2316   RDW 14.3 09/29/2024 2316   RDW 14.5 06/04/2022 0928   LYMPHSABS 1.8 09/29/2024 2316   LYMPHSABS 2.3 06/04/2022 0928   MONOABS 0.8 09/29/2024 2316   EOSABS 0.0 09/29/2024 2316   EOSABS 0.5 (H) 06/04/2022 0928   BASOSABS 0.0 09/29/2024 2316   BASOSABS 0.0 06/04/2022 0928    CMP     Component Value Date/Time   NA 135 09/29/2024 2316   K 3.9 09/29/2024 2316   CL 101 09/29/2024 2316   CO2 23 09/29/2024 2316   GLUCOSE 125 (H) 09/29/2024 2316   BUN 10 09/29/2024 2316   CREATININE 0.91 09/29/2024 2316  CREATININE 0.92 04/24/2024 1016    CALCIUM 9.6 09/29/2024 2316   PROT 8.4 (H) 09/29/2024 2316   ALBUMIN 4.4 09/29/2024 2316   AST 15 09/29/2024 2316   AST 19 04/24/2024 1016   ALT 14 09/29/2024 2316   ALT 22 04/24/2024 1016   ALKPHOS 82 09/29/2024 2316   BILITOT 1.1 09/29/2024 2316   BILITOT 0.6 04/24/2024 1016   GFRNONAA >60 09/29/2024 2316   GFRNONAA >60 04/24/2024 1016   GFRAA >60 08/05/2020 0955   GFRAA >60 06/04/2020 0830    ASSESSMENT and THERAPY PLAN:   Assessment and Plan Assessment & Plan Estrogen receptor negative invasive ductal carcinoma of the upper-outer quadrant of the left breast in remission Four years post-diagnosis and treatment, in remission.  Mild post-surgical asymmetry, no recurrence evidence. - Follow up on Guardant reveal. - Schedule follow-up in six months for surveillance.  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 No respiratory symptoms, managed by allergy and asthma specialist. - Continue management with allergy and asthma specialist. - Monitor ocular and respiratory symptoms.  Autoimmune uveitis Controlled with Humira, effective for ocular involvement. Increased infection risk noted. - Continue Humira if infection risk manageable. - Monitor for infections, communicate with prescribing provider if recurrent.  Recurrent urinary tract infection Two recent infections, one requiring hospitalization, likely due to Humira.  - Notify provider if infections persist, consider prophylactic antibiotics or regimen change. - Recommend increased hydration and cranberry juice. - Communicate with providers if further episodes occur.   Total time spent: 30 min  *Total Encounter Time as defined by the Centers for Medicare and Medicaid Services includes, in addition to the face-to-face time of a patient visit (documented in the note above) non-face-to-face time: obtaining and reviewing outside history, ordering and reviewing medications, tests or  procedures, care coordination (communications with other health care professionals or caregivers) and documentation in the medical record.

## 2024-10-30 ENCOUNTER — Ambulatory Visit

## 2024-10-30 DIAGNOSIS — J4550 Severe persistent asthma, uncomplicated: Secondary | ICD-10-CM

## 2024-10-31 NOTE — Therapy (Incomplete)
 OUTPATIENT PHYSICAL THERAPY  UPPER EXTREMITY ONCOLOGY EVALUATION  Patient Name: Jeanne Evans MRN: [id number] DOB:June 28, 1986, 38 y.o., female Today's Date: 10/31/2024  END OF SESSION:   Past Medical History:  Diagnosis Date   Asthma 07/31/2021   Breast cancer (HCC)    Family history of ovarian cancer    Family history of prostate cancer    GERD (gastroesophageal reflux disease)    History of asthma    has albuterol , uses PRN   History of heart murmur in childhood    History of multiple allergies    Past Surgical History:  Procedure Laterality Date   BREAST LUMPECTOMY Left 11/2020   BREAST LUMPECTOMY WITH RADIOACTIVE SEED AND SENTINEL LYMPH NODE BIOPSY Left 11/19/2020   Procedure: LEFT BREAST LUMPECTOMY X 2 WITH RADIOACTIVE SEED AND SENTINEL LYMPH NODE MAPPING;  Surgeon: Vanderbilt Ned, MD;  Location: [facility];  Service: General;  Laterality: Left;  PECTORAL BLOCK   BREAST REDUCTION SURGERY Right 10/27/2021   Procedure: MAMMARY REDUCTION  TO RIGHT BREAST;  Surgeon: Elisabeth Craig RAMAN, MD;  Location: [facility];  Service: Plastics;  Laterality: Right;   LIPOSUCTION WITH LIPOFILLING Bilateral 10/27/2021   Procedure: BILATERAL FAT GRAFTING TO BREAST;  Surgeon: Elisabeth Craig RAMAN, MD;  Location: [facility];  Service: Plastics;  Laterality: Bilateral;   PORTACATH PLACEMENT Right 06/18/2020   Procedure: INSERTION PORT-A-CATH WITH ULTRASOUND GUIDANCE;  Surgeon: Vanderbilt Ned, MD;  Location: [facility];  Service: General;  Laterality: Right;   RE-EXCISION OF BREAST LUMPECTOMY Left 12/09/2020   Procedure: RE-EXCISION OF LEFT BREAST LUMPECTOMY;  Surgeon: Vanderbilt Ned, MD;  Location: [facility];  Service: General;  Laterality: Left;   REDUCTION MAMMAPLASTY Right    Patient Active Problem List   Diagnosis Date Noted   Seasonal allergic rhinitis due to pollen 06/14/2024   Intrinsic atopic dermatitis  06/14/2024   History of sarcoidosis 06/06/2023   Vitamin D  deficiency 06/06/2023   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   Lichen planus 01/21/2023   Gastroesophageal reflux disease without esophagitis 01/21/2023   Other allergic rhinitis 01/21/2023   Port-A-Cath in place 03/11/2022   Acute respiratory failure with hypoxemia (HCC) 02/03/2022   Acute respiratory failure with hypoxia (HCC) 02/03/2022   Not well controlled asthma without complication 07/31/2021   Lymphedema of left arm 02/02/2021   Drug-induced neutropenia 09/30/2020   Genetic testing 06/26/2020   Family history of ovarian cancer    Family history of prostate cancer    Malignant neoplasm of upper-outer quadrant of left breast in female, estrogen receptor negative (HCC) 06/03/2020      REFERRING PROVIDER: Amber Stalls, MD  REFERRING DIAG: Left arm pain/tightness  THERAPY DIAG:  No diagnosis found.  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 My arm started aching to the bone. I was running a fever, and cold air made it ache. I went to UC they didn't see swelling or anything. My armpit was tight and I was doing MLD. They didn't find anything. I went to the ED the next day and I had a UTI. The achiness went away  some when I got the antibiotics and ibuprofen . I still get the achiness, but not as often. It can still be very painful though, especially when its cold. When I saw Dr. Loretha she wanted me to come see you to be checked.  PERTINENT HISTORY:  Patient was diagnosed on 05/12/2020 with left breast cancer. She underwent neoadjuvant chemotherapy  06/19/2020-09/23/2020 and had a left lumpectomy and sentinel node biopsy (6 negative nodes) on 11/19/2020. 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 Numbness in posterior arm is a little better. She had a right breast reduction and left Mastopexy on 10/27/2021  PAIN:  Are you having pain? Yes NPRS scale: 0/10 present-9/10 at worst Pain location: entire left UE/axilla Pain orientation: Left  PAIN TYPE: aching, throbbing, and tight, occasional numbness, tingle Pain description: intermittent and aching  Aggravating factors: cold weather,  Relieving factors: massage,  PRECAUTIONS: Left UE lymphedema risk, Sarcoidosis, uveitis  RED FLAGS: None   WEIGHT BEARING RESTRICTIONS: No  FALLS:  Has patient fallen in last 6 months? No  LIVING ENVIRONMENT: Lives with: lives with their family Lives in: House/apartment Stairs: No;    OCCUPATION: custodial cleaning/writing bonds  LEISURE: cook, shop  HAND DOMINANCE: right   PRIOR LEVEL OF FUNCTION: Independent  PATIENT GOALS: Decrease pain,  improve ROM   OBJECTIVE: Note: Objective measures were completed at Evaluation unless otherwise noted.  COGNITION: Overall cognitive status: Within functional limits for tasks assessed   PALPATION: Palpable cording in axilla, and mildly in forearm  OBSERVATIONS / OTHER ASSESSMENTS: cording noted left axilla and running down into left forearm  SENSATION: Light touch: Deficits     POSTURE: Forward head, rounded shouders  UPPER EXTREMITY AROM/PROM:  A/PROM RIGHT   eval   Shoulder extension   Shoulder flexion 155  Shoulder abduction 176  Shoulder internal rotation 60  Shoulder external rotation 92    (Blank rows = not tested)  A/PROM LEFT   eval  Shoulder extension   Shoulder flexion 156  Shoulder abduction 158  Shoulder internal rotation 45  Shoulder external rotation 86    (Blank rows = not tested)  CERVICAL AROM: All within normal limits:      UPPER EXTREMITY  STRENGTH:   LYMPHEDEMA ASSESSMENTS:   SURGERY TYPE/DATE: left lumpectomy and sentinel node biopsy (6 negative nodes) on 11/19/2020. right breast reduction and left Mastopexy on 10/27/2021  NUMBER OF LYMPH NODES REMOVED: 0+/6  CHEMOTHERAPY: YES  RADIATION:YES  HORMONE TREATMENT: NO     LYMPHEDEMA ASSESSMENTS:   LANDMARK RIGHT  eval  At axilla  39.1  15 cm proximal to the proximal aspect of the olecranon process 39  10 cm proximal to the proximal aspect of the olecranon process 38  Olecranon process 30  15 cm proximal to the proximal aspect of the ulnar styloid process 28.0  10 cm proximal to the proximal aspect of the ulnar styloid process 24.1  Just distal to the ulnar styloid process 17.5  Across hand at thumb web space 20.1  At base of 2nd digit 6.8  (Blank  rows = not tested)  LANDMARK LEFT  eval  At axilla  37.3  15 cm proximal to the proximal aspect of the olecranon process 38.9  10 cm proximal to the proximal aspect of the olecranon process 37.5  Olecranon process 29.0  15 cm proximal to the proximal aspect of the ulnar styloid process 27.1  10 cm proximal to  the proximal aspect of the ulnar styloid process 22.6  Just distal to the ulnar styloid process 17.15  Across hand at thumb web space 19.7  At base of 2nd digit 6.6  (Blank rows = not tested)  Chest circumference just inferior to the axillae:  Chest circumference at the largest point:       L-DEX LYMPHEDEMA SCREENING:  L-DEX FLOWSHEETS - 11/01/24 0900       L-DEX LYMPHEDEMA SCREENING   Measurement Type Unilateral    L-DEX MEASUREMENT EXTREMITY Upper Extremity    POSITION  Standing    DOMINANT SIDE Right    At Risk Side Left    BASELINE SCORE (UNILATERAL) -1.2    L-DEX SCORE (UNILATERAL) 1.4    VALUE CHANGE (UNILAT) 2.6           QUICK DASH SURVEY: 59%                                                                                                                            TREATMENT  DATE:  11/01/2024 Pt educated in exs listed below performing each x 4-5 repetitions to decrease shoulder/chest tightness and cording. TG soft sleeve cut to assist with cording/keeping her arm warm. Discussed POC, LOS, treatment intervetions,No show policy    PATIENT EDUCATION:  Education details: Access Code: [email or web address] URL: [email or web address] Date: 11/01/2024 Prepared by: Grayce Sheldon  Exercises - Supine Shoulder Flexion Extension AAROM with Dowel  - 2 x daily - 7 x weekly - 1 sets - 5 reps - 5 hold - Single Arm Doorway Pec Stretch at 90 Degrees Abduction  - 2 x daily - 7 x weekly - 1 sets - 3 reps - 20 hold Supine stargazer, standing shoulder wall slide for abduction Person educated: Patient Education method: Explanation, Demonstration, and Handouts Education comprehension: verbalized understanding and returned demonstration  HOME EXERCISE PROGRAM: - Supine Shoulder Flexion Extension AAROM with Dowel    - Single Arm Doorway Pec Stretch at 90 Degrees Abduction   Supine stargazer, standing shoulder wall slide for abduction  ASSESSMENT:  CLINICAL IMPRESSION: Patient is a 38 y.o. female who was seen today for physical therapy evaluation and treatment for complaints of left UE tightness/pain/ achiness present since the end of November. She has visible and palpable cording in the axilla, with tightness felt throughout the forearm. Her left pectorals are very tight and her left shoulder ROM is restricted. She does not have any measurable edema today and her SOZO screen was in the green. She was educated in Left shoulder/pec stretches to start for HEP. She will benefit from skilled  PT to address cording and ROM deficits to help decrease the achiness/pain in her left UE.   OBJECTIVE IMPAIRMENTS: decreased activity tolerance, decreased knowledge of condition, decreased mobility, decreased ROM, increased fascial restrictions, impaired UE functional use, postural dysfunction, and  pain.   ACTIVITY LIMITATIONS: carrying, lifting, and reach over head  PARTICIPATION LIMITATIONS: cleaning, occupation, and church  PERSONAL FACTORS: 3+ comorbidities: neoadjuvant chemo, L.Breast surgery with SLNB,radiation are also affecting patient's functional outcome.   REHAB POTENTIAL: Excellent  CLINICAL DECISION MAKING: Stable/uncomplicated  EVALUATION COMPLEXITY: Low  GOALS: Goals reviewed with patient? Yes  SHORT TERM GOALS=LONG TERM GOALS: Target date: 12/14/2023  Patient will demonstrate independence in HEP to decrease pain and improve ROM  Baseline: Goal status: INITIAL  2.  Patient will increase left shoulder active abduction to >/= 170 degrees for increased ease reaching  Baseline:  Goal status: INITIAL  3.  Patient will report >/= 50% decrease in pain/tightness for increased tolerance of daily tasks  Baseline:  Goal status: INITIAL  4.  Quick dash will improve to no greater than 18% to demonstrate improved function   Baseline 59%   Goal status: INITIAL PLAN:  PT FREQUENCY: 2x/week  PT DURATION: 6 weeks  PLANNED INTERVENTIONS: 97164- PT Re-evaluation, 97750- Physical Performance Testing, 97110-Therapeutic exercises, 97530- Therapeutic activity, [id number]- Neuromuscular re-education, 97535- Self Care, [id number]- Manual therapy, 97760- Orthotic Initial, and [id number]- Orthotic/Prosthetic subsequent  PLAN FOR NEXT SESSION: Review HEP, Cording release left UE,  cupping prn,PROM, pec stretches, supine AROM progress to half roll etc, MLD prn  Grayce JINNY Sheldon, PT 10/31/2024, 7:39 AM

## 2024-11-01 ENCOUNTER — Other Ambulatory Visit: Payer: Self-pay

## 2024-11-01 ENCOUNTER — Ambulatory Visit

## 2024-11-01 DIAGNOSIS — L905 Scar conditions and fibrosis of skin: Secondary | ICD-10-CM | POA: Diagnosis present

## 2024-11-01 DIAGNOSIS — C50412 Malignant neoplasm of upper-outer quadrant of left female breast: Secondary | ICD-10-CM | POA: Diagnosis present

## 2024-11-01 DIAGNOSIS — M25612 Stiffness of left shoulder, not elsewhere classified: Secondary | ICD-10-CM | POA: Diagnosis present

## 2024-11-01 DIAGNOSIS — Z171 Estrogen receptor negative status [ER-]: Secondary | ICD-10-CM | POA: Diagnosis not present

## 2024-11-01 DIAGNOSIS — Z483 Aftercare following surgery for neoplasm: Secondary | ICD-10-CM | POA: Insufficient documentation

## 2024-11-01 DIAGNOSIS — L7682 Other postprocedural complications of skin and subcutaneous tissue: Secondary | ICD-10-CM | POA: Diagnosis present

## 2024-11-06 ENCOUNTER — Ambulatory Visit

## 2024-11-06 DIAGNOSIS — Z483 Aftercare following surgery for neoplasm: Secondary | ICD-10-CM

## 2024-11-06 DIAGNOSIS — C50412 Malignant neoplasm of upper-outer quadrant of left female breast: Secondary | ICD-10-CM

## 2024-11-06 DIAGNOSIS — L905 Scar conditions and fibrosis of skin: Secondary | ICD-10-CM

## 2024-11-06 DIAGNOSIS — M25612 Stiffness of left shoulder, not elsewhere classified: Secondary | ICD-10-CM

## 2024-11-06 NOTE — Therapy (Signed)
 " OUTPATIENT PHYSICAL THERAPY  UPPER EXTREMITY ONCOLOGY EVALUATION  Patient Name: Jeanne Evans MRN: [id number] DOB:[birth year], 38 y.o., female Today's Date: 11/06/2024  END OF SESSION:  PT End of Session - 11/06/24 0910     Visit Number 2    Number of Visits 12    Date for Recertification  12/13/24    Authorization Type [employer] commercial and Medicaid    PT Start Time 0910    PT Stop Time [phone]    PT Time Calculation (min) 43 min    Activity Tolerance Patient tolerated treatment well    Behavior During Therapy [facility] for tasks assessed/performed          Past Medical History:  Diagnosis Date   Asthma 07/31/2021   Breast cancer (HCC)    Family history of ovarian cancer    Family history of prostate cancer    GERD (gastroesophageal reflux disease)    History of asthma    has albuterol , uses PRN   History of heart murmur in childhood    History of multiple allergies    Past Surgical History:  Procedure Laterality Date   BREAST LUMPECTOMY Left 11/2020   BREAST LUMPECTOMY WITH RADIOACTIVE SEED AND SENTINEL LYMPH NODE BIOPSY Left 11/19/2020   Procedure: LEFT BREAST LUMPECTOMY X 2 WITH RADIOACTIVE SEED AND SENTINEL LYMPH NODE MAPPING;  Surgeon: Vanderbilt Ned, MD;  Location: [facility];  Service: General;  Laterality: Left;  PECTORAL BLOCK   BREAST REDUCTION SURGERY Right 10/27/2021   Procedure: MAMMARY REDUCTION  TO RIGHT BREAST;  Surgeon: Elisabeth Craig RAMAN, MD;  Location: [facility];  Service: Plastics;  Laterality: Right;   LIPOSUCTION WITH LIPOFILLING Bilateral 10/27/2021   Procedure: BILATERAL FAT GRAFTING TO BREAST;  Surgeon: Elisabeth Craig RAMAN, MD;  Location: [facility];  Service: Plastics;  Laterality: Bilateral;   PORTACATH PLACEMENT Right 06/18/2020   Procedure: INSERTION PORT-A-CATH WITH ULTRASOUND GUIDANCE;  Surgeon: Vanderbilt Ned, MD;  Location: [facility];  Service: General;  Laterality: Right;   RE-EXCISION  OF BREAST LUMPECTOMY Left 12/09/2020   Procedure: RE-EXCISION OF LEFT BREAST LUMPECTOMY;  Surgeon: Vanderbilt Ned, MD;  Location: [facility];  Service: General;  Laterality: Left;   REDUCTION MAMMAPLASTY Right    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   Lichen planus 01/21/2023   Gastroesophageal reflux disease without esophagitis 01/21/2023   Other allergic rhinitis 01/21/2023   Port-A-Cath in place 03/11/2022   Acute respiratory failure with hypoxemia (HCC) 02/03/2022   Acute respiratory failure with hypoxia (HCC) 02/03/2022   Not well controlled asthma without complication 07/31/2021   Lymphedema of left arm 02/02/2021   Drug-induced neutropenia 09/30/2020   Genetic testing 06/26/2020   Family history of ovarian cancer    Family history of prostate cancer    Malignant neoplasm of upper-outer quadrant of left breast in female, estrogen receptor negative (HCC) 06/03/2020      REFERRING PROVIDER: Amber Stalls, MD  REFERRING DIAG: Left arm pain/tightness  THERAPY DIAG:  Malignant neoplasm of upper-outer quadrant of left female breast, unspecified estrogen receptor status (HCC)  Aftercare following surgery for neoplasm  Stiffness of left shoulder, not elsewhere classified  Axillary web syndrome  ONSET DATE: Nov 26,2025  Rationale  for Evaluation and Treatment: Rehabilitation  SUBJECTIVE:                                                                                                                                                                                           SUBJECTIVE STATEMENT:   My arm is doing better. It didn't hurt this past weekend, but I was so busy I wouldn't have noticed. A  little sore and tight in the left axilla today. The TG soft helps with the achiness. I haven't had too much.  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 My arm started aching to the bone. I was running a fever, and cold air made it ache. I went to UC they didn't see swelling or anything. My armpit was tight and I was doing MLD. They didn't find anything. I went to the ED the next day and I had a UTI. The achiness went away  some when I got the antibiotics and ibuprofen . I still get the achiness, but not as often. It can still be very painful though, especially when its cold. When I saw Dr. Loretha she wanted me to come see you to be checked.  PERTINENT HISTORY:  Patient was diagnosed on 05/12/2020 with left breast cancer. She underwent neoadjuvant chemotherapy 06/19/2020-09/23/2020 and had a left lumpectomy and sentinel node biopsy (6 negative nodes) on 11/19/2020. 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 Numbness in posterior arm is a little better. She had a right breast reduction and left Mastopexy on 10/27/2021  PAIN:  Are you having pain? Yes NPRS scale: 0/10 present-8/10 at worst Pain location: entire left UE/axilla, cording Pain orientation: Left  PAIN TYPE: aching, throbbing, and tight, occasional numbness, tingle Pain description: intermittent and aching  Aggravating factors: cold weather,  Relieving factors: massage,  PRECAUTIONS: Left UE lymphedema risk, Sarcoidosis, uveitis  RED FLAGS: None   WEIGHT BEARING RESTRICTIONS: No  FALLS:  Has patient fallen in last 6 months? No  LIVING ENVIRONMENT: Lives with: lives with their family Lives in: House/apartment Stairs: No;    OCCUPATION: custodial cleaning/writing bonds  LEISURE: cook, shop  HAND DOMINANCE: right   PRIOR LEVEL OF FUNCTION: Independent  PATIENT GOALS: Decrease pain,  improve ROM   OBJECTIVE: Note: Objective measures were completed at Evaluation unless otherwise  noted.  COGNITION: Overall cognitive status: Within functional limits for tasks assessed   PALPATION: Palpable cording in axilla, and mildly in forearm  OBSERVATIONS / OTHER ASSESSMENTS: cording noted left axilla and running down into left forearm  SENSATION: Light touch: Deficits     POSTURE: Forward  head, rounded shouders  UPPER EXTREMITY AROM/PROM:  A/PROM RIGHT   eval   Shoulder extension   Shoulder flexion 155  Shoulder abduction 176  Shoulder internal rotation 60  Shoulder external rotation 92    (Blank rows = not tested)  A/PROM LEFT   eval  Shoulder extension   Shoulder flexion 156  Shoulder abduction 158  Shoulder internal rotation 45  Shoulder external rotation 86    (Blank rows = not tested)  CERVICAL AROM: All within normal limits:      UPPER EXTREMITY STRENGTH:   LYMPHEDEMA ASSESSMENTS:   SURGERY TYPE/DATE: left lumpectomy and sentinel node biopsy (6 negative nodes) on 11/19/2020. right breast reduction and left Mastopexy on 10/27/2021  NUMBER OF LYMPH NODES REMOVED: 0+/6  CHEMOTHERAPY: YES  RADIATION:YES  HORMONE TREATMENT: NO     LYMPHEDEMA ASSESSMENTS:   LANDMARK RIGHT  eval  At axilla  39.1  15 cm proximal to the proximal aspect of the olecranon process 39  10 cm proximal to the proximal aspect of the olecranon process 38  Olecranon process 30  15 cm proximal to the proximal aspect of the ulnar styloid process 28.0  10 cm proximal to the proximal aspect of the ulnar styloid process 24.1  Just distal to the ulnar styloid process 17.5  Across hand at thumb web space 20.1  At base of 2nd digit 6.8  (Blank rows = not tested)  LANDMARK LEFT  eval  At axilla  37.3  15 cm proximal to the proximal aspect of the olecranon process 38.9  10 cm proximal to the proximal aspect of the olecranon process 37.5  Olecranon process 29.0  15 cm proximal to the proximal aspect of the ulnar styloid process 27.1  10 cm proximal to  the proximal  aspect of the ulnar styloid process 22.6  Just distal to the ulnar styloid process 17.15  Across hand at thumb web space 19.7  At base of 2nd digit 6.6  (Blank rows = not tested)  Chest circumference just inferior to the axillae:  Chest circumference at the largest point:       L-DEX LYMPHEDEMA SCREENING:     QUICK DASH SURVEY: 59%                                                                                                                            TREATMENT DATE:   11/06/2024 Pulleys x 2 min flex and abduction Ball rolls on wall x 10 forward and 5 left abd 3 D AROM x 5 ea, alternate flexion, bilateral scaption, bilateral horizontal abd, snow angels x 5 Star gazer x 5 Cording release left UE STM left UT and pectorals, lateral trunk with cocoa butter PROM left shoulder flex, scaption, abd, ER with VC's to relax  11/01/2024 Pt educated in exs listed below performing each x 4-5 repetitions to decrease shoulder/chest tightness and cording. TG soft sleeve cut to assist with cording/keeping her arm warm. Discussed POC, LOS,  treatment intervetions,No show policy    PATIENT EDUCATION:  Education details: Access Code: [email or web address] URL: [email or web address] Date: 11/01/2024 Prepared by: Grayce Sheldon  Exercises - Supine Shoulder Flexion Extension AAROM with Dowel  - 2 x daily - 7 x weekly - 1 sets - 5 reps - 5 hold - Single Arm Doorway Pec Stretch at 90 Degrees Abduction  - 2 x daily - 7 x weekly - 1 sets - 3 reps - 20 hold Supine stargazer, standing shoulder wall slide for abduction Person educated: Patient Education method: Explanation, Demonstration, and Handouts Education comprehension: verbalized understanding and returned demonstration  HOME EXERCISE PROGRAM: - Supine Shoulder Flexion Extension AAROM with Dowel    - Single Arm Doorway Pec Stretch at 90 Degrees Abduction   Supine stargazer, standing shoulder wall slide for  abduction  ASSESSMENT:  CLINICAL IMPRESSION: TG soft has been a big help with achiness in UE. Very tight in pectorals; clavicular and axillary border noted with STM today.  ROM visibly improved by end of session.   EVAL Patient is a 38 y.o. female who was seen today for physical therapy evaluation and treatment for complaints of left UE tightness/pain/ achiness present since the end of November. She has visible and palpable cording in the axilla, with tightness felt throughout the forearm. Her left pectorals are very tight and her left shoulder ROM is restricted. She does not have any measurable edema today and her SOZO screen was in the green. She was educated in Left shoulder/pec stretches to start for HEP. She will benefit from skilled PT to address cording and ROM deficits to help decrease the achiness/pain in her left UE.   OBJECTIVE IMPAIRMENTS: decreased activity tolerance, decreased knowledge of condition, decreased mobility, decreased ROM, increased fascial restrictions, impaired UE functional use, postural dysfunction, and pain.   ACTIVITY LIMITATIONS: carrying, lifting, and reach over head  PARTICIPATION LIMITATIONS: cleaning, occupation, and church  PERSONAL FACTORS: 3+ comorbidities: neoadjuvant chemo, L.Breast surgery with SLNB,radiation are also affecting patient's functional outcome.   REHAB POTENTIAL: Excellent  CLINICAL DECISION MAKING: Stable/uncomplicated  EVALUATION COMPLEXITY: Low  GOALS: Goals reviewed with patient? Yes  SHORT TERM GOALS=LONG TERM GOALS: Target date: 12/14/2023  Patient will demonstrate independence in HEP to decrease pain and improve ROM  Baseline: Goal status: INITIAL  2.  Patient will increase left shoulder active abduction to >/= 170 degrees for increased ease reaching  Baseline:  Goal status: INITIAL  3.  Patient will report >/= 50% decrease in pain/tightness for increased tolerance of daily tasks  Baseline:  Goal status:  INITIAL  4.  Quick dash will improve to no greater than 18% to demonstrate improved function   Baseline 59%   Goal status: INITIAL PLAN:  PT FREQUENCY: 2x/week  PT DURATION: 6 weeks  PLANNED INTERVENTIONS: 97164- PT Re-evaluation, 97750- Physical Performance Testing, 97110-Therapeutic exercises, 97530- Therapeutic activity, [id number]- Neuromuscular re-education, 97535- Self Care, 02859- Manual therapy, 97760- Orthotic Initial, and [id number]- Orthotic/Prosthetic subsequent  PLAN FOR NEXT SESSION: Review HEP, Cording release left UE,  cupping prn,PROM, pec stretches, supine AROM progress to half roll etc, MLD prn  Grayce JINNY Sheldon, PT 11/06/2024, 9:54 AM  "

## 2024-11-13 ENCOUNTER — Ambulatory Visit

## 2024-11-14 ENCOUNTER — Ambulatory Visit

## 2024-11-14 DIAGNOSIS — M25612 Stiffness of left shoulder, not elsewhere classified: Secondary | ICD-10-CM

## 2024-11-14 DIAGNOSIS — C50412 Malignant neoplasm of upper-outer quadrant of left female breast: Secondary | ICD-10-CM

## 2024-11-14 DIAGNOSIS — J4550 Severe persistent asthma, uncomplicated: Secondary | ICD-10-CM

## 2024-11-14 DIAGNOSIS — L7682 Other postprocedural complications of skin and subcutaneous tissue: Secondary | ICD-10-CM

## 2024-11-14 DIAGNOSIS — Z483 Aftercare following surgery for neoplasm: Secondary | ICD-10-CM

## 2024-11-14 NOTE — Therapy (Signed)
 " OUTPATIENT PHYSICAL THERAPY  UPPER EXTREMITY ONCOLOGY EVALUATION  Patient Name: Jeanne Evans MRN: [id number] DOB:[birth year], 38 y.o., female Today's Date: 11/14/2024  END OF SESSION:  PT End of Session - 11/14/24 1003     Visit Number 3    Number of Visits 12    Date for Recertification  12/13/24    Authorization Type UHC commercial and Medicaid    Authorization Time Period [facility] 12/18-1/29/2026    Authorization - Visit Number 12    PT Start Time 1004    PT Stop Time 1105    PT Time Calculation (min) 61 min    Activity Tolerance Patient tolerated treatment well    Behavior During Therapy [facility] for tasks assessed/performed          Past Medical History:  Diagnosis Date   Asthma 07/31/2021   Breast cancer (HCC)    Family history of ovarian cancer    Family history of prostate cancer    GERD (gastroesophageal reflux disease)    History of asthma    has albuterol , uses PRN   History of heart murmur in childhood    History of multiple allergies    Past Surgical History:  Procedure Laterality Date   BREAST LUMPECTOMY Left 11/2020   BREAST LUMPECTOMY WITH RADIOACTIVE SEED AND SENTINEL LYMPH NODE BIOPSY Left 11/19/2020   Procedure: LEFT BREAST LUMPECTOMY X 2 WITH RADIOACTIVE SEED AND SENTINEL LYMPH NODE MAPPING;  Surgeon: Vanderbilt Ned, MD;  Location: [facility];  Service: General;  Laterality: Left;  PECTORAL BLOCK   BREAST REDUCTION SURGERY Right 10/27/2021   Procedure: MAMMARY REDUCTION  TO RIGHT BREAST;  Surgeon: Elisabeth Craig RAMAN, MD;  Location: [facility];  Service: Plastics;  Laterality: Right;   LIPOSUCTION WITH LIPOFILLING Bilateral 10/27/2021   Procedure: BILATERAL FAT GRAFTING TO BREAST;  Surgeon: Elisabeth Craig RAMAN, MD;  Location: [facility];  Service: Plastics;  Laterality: Bilateral;   PORTACATH PLACEMENT Right 06/18/2020   Procedure: INSERTION PORT-A-CATH WITH ULTRASOUND GUIDANCE;  Surgeon: Vanderbilt Ned, MD;   Location: [facility];  Service: General;  Laterality: Right;   RE-EXCISION OF BREAST LUMPECTOMY Left 12/09/2020   Procedure: RE-EXCISION OF LEFT BREAST LUMPECTOMY;  Surgeon: Vanderbilt Ned, MD;  Location: [facility];  Service: General;  Laterality: Left;   REDUCTION MAMMAPLASTY Right    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   Lichen planus 01/21/2023   Gastroesophageal reflux disease without esophagitis 01/21/2023   Other allergic rhinitis 01/21/2023   Port-A-Cath in place 03/11/2022   Acute respiratory failure with hypoxemia (HCC) 02/03/2022   Acute respiratory failure with hypoxia (HCC) 02/03/2022   Not well controlled asthma without complication 07/31/2021   Lymphedema of left arm 02/02/2021   Drug-induced neutropenia 09/30/2020   Genetic testing 06/26/2020   Family history of ovarian cancer    Family history of prostate cancer    Malignant neoplasm of upper-outer quadrant of left breast in female, estrogen receptor negative (HCC) 06/03/2020      REFERRING PROVIDER: Amber Stalls, MD  REFERRING DIAG: Left arm pain/tightness  THERAPY DIAG:  Malignant neoplasm of upper-outer quadrant of left female breast, unspecified estrogen receptor status (HCC)  Aftercare following surgery for neoplasm  Stiffness of  left shoulder, not elsewhere classified  Axillary web syndrome  ONSET DATE: Nov 26,2025  Rationale for Evaluation and Treatment: Rehabilitation  SUBJECTIVE:                                                                                                                                                                                           SUBJECTIVE STATEMENT:   My arm is  doing better. It didn't hurt this past weekend, but I was so busy I wouldn't have noticed. A little sore and tight in the left axilla today. The TG soft helps with the achiness. I haven't had too much.  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 My arm started aching to the bone. I was running a fever, and cold air made it ache. I went to UC they didn't see swelling or anything. My armpit was tight and I was doing MLD. They didn't find anything. I went to the ED the next day and I had a UTI. The achiness went away  some when I got the antibiotics and ibuprofen . I still get the achiness, but not as often. It can still be very painful though, especially when its cold. When I saw Dr. Loretha she wanted me to come see you to be checked.  PERTINENT HISTORY:  Patient was diagnosed on 05/12/2020 with left breast cancer. She underwent neoadjuvant chemotherapy 06/19/2020-09/23/2020 and had a left lumpectomy and sentinel node biopsy (6 negative nodes) on 11/19/2020. 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 Numbness in posterior arm is a little better. She had a right breast reduction and left Mastopexy on 10/27/2021  PAIN:  Are you having pain? Yes NPRS scale: 0/10 present-8/10 at worst Pain location: entire left UE/axilla, cording Pain orientation: Left  PAIN TYPE: aching, throbbing, and tight, occasional numbness, tingle Pain description: intermittent and aching  Aggravating factors: cold weather,  Relieving factors: massage,  PRECAUTIONS: Left UE lymphedema risk, Sarcoidosis, uveitis  RED FLAGS: None   WEIGHT BEARING RESTRICTIONS: No  FALLS:  Has patient fallen in last 6 months? No  LIVING ENVIRONMENT: Lives with: lives with their family Lives in: House/apartment Stairs: No;    OCCUPATION: custodial cleaning/writing bonds  LEISURE: cook, shop  HAND DOMINANCE: right   PRIOR LEVEL OF FUNCTION: Independent  PATIENT GOALS: Decrease pain,  improve  ROM   OBJECTIVE: Note: Objective measures were completed at Evaluation unless otherwise noted.  COGNITION: Overall cognitive status: Within functional limits for tasks assessed   PALPATION: Palpable cording in axilla, and mildly in forearm  OBSERVATIONS / OTHER ASSESSMENTS: cording noted left axilla and  running down into left forearm  SENSATION: Light touch: Deficits     POSTURE: Forward head, rounded shouders  UPPER EXTREMITY AROM/PROM:  A/PROM RIGHT   eval   Shoulder extension   Shoulder flexion 155  Shoulder abduction 176  Shoulder internal rotation 60  Shoulder external rotation 92    (Blank rows = not tested)  A/PROM LEFT   eval  Shoulder extension   Shoulder flexion 156  Shoulder abduction 158  Shoulder internal rotation 45  Shoulder external rotation 86    (Blank rows = not tested)  CERVICAL AROM: All within normal limits:      UPPER EXTREMITY STRENGTH:   LYMPHEDEMA ASSESSMENTS:   SURGERY TYPE/DATE: left lumpectomy and sentinel node biopsy (6 negative nodes) on 11/19/2020. right breast reduction and left Mastopexy on 10/27/2021  NUMBER OF LYMPH NODES REMOVED: 0+/6  CHEMOTHERAPY: YES  RADIATION:YES  HORMONE TREATMENT: NO     LYMPHEDEMA ASSESSMENTS:   LANDMARK RIGHT  eval  At axilla  39.1  15 cm proximal to the proximal aspect of the olecranon process 39  10 cm proximal to the proximal aspect of the olecranon process 38  Olecranon process 30  15 cm proximal to the proximal aspect of the ulnar styloid process 28.0  10 cm proximal to the proximal aspect of the ulnar styloid process 24.1  Just distal to the ulnar styloid process 17.5  Across hand at thumb web space 20.1  At base of 2nd digit 6.8  (Blank rows = not tested)  LANDMARK LEFT  eval  At axilla  37.3  15 cm proximal to the proximal aspect of the olecranon process 38.9  10 cm proximal to the proximal aspect of the olecranon process 37.5  Olecranon process 29.0  15 cm  proximal to the proximal aspect of the ulnar styloid process 27.1  10 cm proximal to  the proximal aspect of the ulnar styloid process 22.6  Just distal to the ulnar styloid process 17.15  Across hand at thumb web space 19.7  At base of 2nd digit 6.6  (Blank rows = not tested)  Chest circumference just inferior to the axillae:  Chest circumference at the largest point:       L-DEX LYMPHEDEMA SCREENING:     QUICK DASH SURVEY: 59%                                                                                                                            TREATMENT DATE:  11/14/2024  Pulleys x 2 min flex and abduction Ball rolls on wall x  forward and 5 left abd Half foam roll :3 D AROM x 5 ea, alternate flexion, bilateral scaption, bilateral horizontal abd, snow angels x 5 Cording release left UE STM left UT and pectorals, lateral trunk with cocoa butter PROM left shoulder flex, scaption, abd, ER with VC's to relax  11/06/2024 Pulleys x 2 min flex and abduction Ball rolls on wall x 10 forward and 5  left abd 3 D AROM x 5 ea, alternate flexion, bilateral scaption, bilateral horizontal abd, snow angels x 5 Star gazer x 5 Cording release left UE STM left UT and pectorals, lateral trunk with cocoa butter PROM left shoulder flex, scaption, abd, ER with VC's to relax  11/01/2024 Pt educated in exs listed below performing each x 4-5 repetitions to decrease shoulder/chest tightness and cording. TG soft sleeve cut to assist with cording/keeping her arm warm. Discussed POC, LOS, treatment intervetions,No show policy    PATIENT EDUCATION:  Education details: Access Code: [email or web address] URL: [email or web address] Date: 11/01/2024 Prepared by: Grayce Sheldon  Exercises - Supine Shoulder Flexion Extension AAROM with Dowel  - 2 x daily - 7 x weekly - 1 sets - 5 reps - 5 hold - Single Arm Doorway Pec Stretch at 90 Degrees Abduction  - 2 x daily - 7 x weekly - 1 sets - 3 reps -  20 hold Supine stargazer, standing shoulder wall slide for abduction Person educated: Patient Education method: Explanation, Demonstration, and Handouts Education comprehension: verbalized understanding and returned demonstration  HOME EXERCISE PROGRAM: - Supine Shoulder Flexion Extension AAROM with Dowel    - Single Arm Doorway Pec Stretch at 90 Degrees Abduction   Supine stargazer, standing shoulder wall slide for abduction  ASSESSMENT:  CLINICAL IMPRESSION: Pt continues with tightness in left pecs/axillary region. Tightness noted in arm with cord release techniques but minimal palpable cords today.    EVAL Patient is a 38 y.o. female who was seen today for physical therapy evaluation and treatment for complaints of left UE tightness/pain/ achiness present since the end of November. She has visible and palpable cording in the axilla, with tightness felt throughout the forearm. Her left pectorals are very tight and her left shoulder ROM is restricted. She does not have any measurable edema today and her SOZO screen was in the green. She was educated in Left shoulder/pec stretches to start for HEP. She will benefit from skilled PT to address cording and ROM deficits to help decrease the achiness/pain in her left UE.   OBJECTIVE IMPAIRMENTS: decreased activity tolerance, decreased knowledge of condition, decreased mobility, decreased ROM, increased fascial restrictions, impaired UE functional use, postural dysfunction, and pain.   ACTIVITY LIMITATIONS: carrying, lifting, and reach over head  PARTICIPATION LIMITATIONS: cleaning, occupation, and church  PERSONAL FACTORS: 3+ comorbidities: neoadjuvant chemo, L.Breast surgery with SLNB,radiation are also affecting patient's functional outcome.   REHAB POTENTIAL: Excellent  CLINICAL DECISION MAKING: Stable/uncomplicated  EVALUATION COMPLEXITY: Low  GOALS: Goals reviewed with patient? Yes  SHORT TERM GOALS=LONG TERM GOALS: Target date:  12/14/2023  Patient will demonstrate independence in HEP to decrease pain and improve ROM  Baseline: Goal status: INITIAL  2.  Patient will increase left shoulder active abduction to >/= 170 degrees for increased ease reaching  Baseline:  Goal status: INITIAL  3.  Patient will report >/= 50% decrease in pain/tightness for increased tolerance of daily tasks  Baseline:  Goal status: INITIAL  4.  Quick dash will improve to no greater than 18% to demonstrate improved function   Baseline 59%   Goal status: INITIAL PLAN:  PT FREQUENCY: 2x/week  PT DURATION: 6 weeks  PLANNED INTERVENTIONS: 97164- PT Re-evaluation, 97750- Physical Performance Testing, 97110-Therapeutic exercises, 97530- Therapeutic activity, [id number]- Neuromuscular re-education, 97535- Self Care, [id number]- Manual therapy, 97760- Orthotic Initial, and [id number]- Orthotic/Prosthetic subsequent  PLAN FOR NEXT SESSION: Review HEP, Cording release left UE,  cupping prn,PROM, pec stretches, supine AROM progress to half roll etc,  MLD prn  Grayce JINNY Sheldon, PT 11/14/2024, 11:19 AM  OUTPATIENT PHYSICAL THERAPY  UPPER EXTREMITY ONCOLOGY EVALUATION  Patient Name: Jeanne Evans MRN: [id number] DOB:11-25-1985, 38 y.o., female Today's Date: 11/14/2024  END OF SESSION:  PT End of Session - 11/14/24 1003     Visit Number 3    Number of Visits 12    Date for Recertification  12/13/24    Authorization Type UHC commercial and Medicaid    Authorization Time Period [facility] 12/18-1/29/2026    Authorization - Visit Number 12    PT Start Time 1004    PT Stop Time 1105    PT Time Calculation (min) 61 min    Activity Tolerance Patient tolerated treatment well    Behavior During Therapy [facility] for tasks assessed/performed          Past Medical History:  Diagnosis Date   Asthma 07/31/2021   Breast cancer (HCC)    Family history of ovarian cancer    Family history of prostate cancer    GERD (gastroesophageal reflux disease)    History of  asthma    has albuterol , uses PRN   History of heart murmur in childhood    History of multiple allergies    Past Surgical History:  Procedure Laterality Date   BREAST LUMPECTOMY Left 11/2020   BREAST LUMPECTOMY WITH RADIOACTIVE SEED AND SENTINEL LYMPH NODE BIOPSY Left 11/19/2020   Procedure: LEFT BREAST LUMPECTOMY X 2 WITH RADIOACTIVE SEED AND SENTINEL LYMPH NODE MAPPING;  Surgeon: Vanderbilt Ned, MD;  Location: [facility];  Service: General;  Laterality: Left;  PECTORAL BLOCK   BREAST REDUCTION SURGERY Right 10/27/2021   Procedure: MAMMARY REDUCTION  TO RIGHT BREAST;  Surgeon: Elisabeth Craig RAMAN, MD;  Location: [facility];  Service: Plastics;  Laterality: Right;   LIPOSUCTION WITH LIPOFILLING Bilateral 10/27/2021   Procedure: BILATERAL FAT GRAFTING TO BREAST;  Surgeon: Elisabeth Craig RAMAN, MD;  Location: [facility];  Service: Plastics;  Laterality: Bilateral;   PORTACATH PLACEMENT Right 06/18/2020   Procedure: INSERTION PORT-A-CATH WITH ULTRASOUND GUIDANCE;  Surgeon: Vanderbilt Ned, MD;  Location: [facility];  Service: General;  Laterality: Right;   RE-EXCISION OF BREAST LUMPECTOMY Left 12/09/2020   Procedure: RE-EXCISION OF LEFT BREAST LUMPECTOMY;  Surgeon: Vanderbilt Ned, MD;  Location: [facility];  Service: General;  Laterality: Left;   REDUCTION MAMMAPLASTY Right    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   Lichen planus 01/21/2023   Gastroesophageal reflux disease without esophagitis 01/21/2023   Other allergic rhinitis 01/21/2023   Port-A-Cath in place 03/11/2022   Acute respiratory failure with hypoxemia (HCC) 02/03/2022   Acute respiratory failure  with hypoxia (HCC) 02/03/2022   Not well controlled asthma without complication 07/31/2021   Lymphedema of left arm 02/02/2021   Drug-induced neutropenia 09/30/2020   Genetic testing 06/26/2020   Family history of ovarian cancer    Family history of prostate cancer    Malignant neoplasm of upper-outer quadrant of left breast in female, estrogen receptor negative (HCC) 06/03/2020      REFERRING PROVIDER: Amber Stalls, MD  REFERRING DIAG: Left arm pain/tightness  THERAPY DIAG:  Malignant neoplasm of upper-outer quadrant of left female breast, unspecified estrogen receptor  status [facility])  Aftercare following surgery for neoplasm  Stiffness of left shoulder, not elsewhere classified  Axillary web syndrome  ONSET DATE: Nov 26,2025  Rationale for Evaluation and Treatment: Rehabilitation  SUBJECTIVE:                                                                                                                                                                                           SUBJECTIVE STATEMENT:   I did pretty well after last visit and I have been doing my stretches. My arm feels heavy and tired sometimes. Its hard to carry shopping bags in my left hand. I feel a little tight at the upper arm, and achy.   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 My arm started aching to the bone. I was running a fever, and cold air made it ache. I went to UC they didn't see swelling or anything. My armpit was tight and I was doing MLD. They didn't find anything. I went to the ED the next day and I had a UTI. The achiness went away  some when I got the antibiotics and ibuprofen . I still get the achiness, but not as often. It can still be very painful though, especially when its cold. When I saw Dr. Loretha she wanted me to come see you to be checked.  PERTINENT HISTORY:  Patient was diagnosed on 05/12/2020 with left breast cancer. She underwent neoadjuvant  chemotherapy 06/19/2020-09/23/2020 and had a left lumpectomy and sentinel node biopsy (6 negative nodes) on 11/19/2020. 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 Numbness in posterior arm is a little better. She had a right breast reduction and left Mastopexy on 10/27/2021  PAIN:  Are you having pain? Yes NPRS scale: 0/10 present-8/10 at worst Pain location: entire left UE/axilla, cording Pain orientation: Left  PAIN TYPE: aching, throbbing, and tight, occasional numbness, tingle Pain description: intermittent and aching  Aggravating factors: cold weather,  Relieving factors: massage,  PRECAUTIONS: Left UE lymphedema risk, Sarcoidosis, uveitis  RED FLAGS: None   WEIGHT BEARING RESTRICTIONS: No  FALLS:  Has patient fallen in last 6 months? No  LIVING ENVIRONMENT: Lives with: lives with their family Lives in: House/apartment Stairs: No;    OCCUPATION: custodial cleaning/writing bonds  LEISURE: cook, shop  HAND DOMINANCE: right   PRIOR LEVEL OF FUNCTION: Independent  PATIENT GOALS: Decrease pain,  improve ROM   OBJECTIVE: Note: Objective measures were completed at Evaluation unless otherwise noted.  COGNITION: Overall cognitive status: Within functional limits for tasks assessed   PALPATION: Palpable cording in axilla, and mildly in  forearm  OBSERVATIONS / OTHER ASSESSMENTS: cording noted left axilla and running down into left forearm  SENSATION: Light touch: Deficits     POSTURE: Forward head, rounded shouders  UPPER EXTREMITY AROM/PROM:  A/PROM RIGHT   eval   Shoulder extension   Shoulder flexion 155  Shoulder abduction 176  Shoulder internal rotation 60  Shoulder external rotation 92    (Blank rows = not tested)  A/PROM LEFT   eval  Shoulder extension   Shoulder flexion 156  Shoulder abduction 158  Shoulder internal rotation 45  Shoulder external rotation 86    (Blank rows = not tested)  CERVICAL AROM: All within normal limits:       UPPER EXTREMITY STRENGTH:   LYMPHEDEMA ASSESSMENTS:   SURGERY TYPE/DATE: left lumpectomy and sentinel node biopsy (6 negative nodes) on 11/19/2020. right breast reduction and left Mastopexy on 10/27/2021  NUMBER OF LYMPH NODES REMOVED: 0+/6  CHEMOTHERAPY: YES  RADIATION:YES  HORMONE TREATMENT: NO     LYMPHEDEMA ASSESSMENTS:   LANDMARK RIGHT  eval  At axilla  39.1  15 cm proximal to the proximal aspect of the olecranon process 39  10 cm proximal to the proximal aspect of the olecranon process 38  Olecranon process 30  15 cm proximal to the proximal aspect of the ulnar styloid process 28.0  10 cm proximal to the proximal aspect of the ulnar styloid process 24.1  Just distal to the ulnar styloid process 17.5  Across hand at thumb web space 20.1  At base of 2nd digit 6.8  (Blank rows = not tested)  LANDMARK LEFT  eval  At axilla  37.3  15 cm proximal to the proximal aspect of the olecranon process 38.9  10 cm proximal to the proximal aspect of the olecranon process 37.5  Olecranon process 29.0  15 cm proximal to the proximal aspect of the ulnar styloid process 27.1  10 cm proximal to  the proximal aspect of the ulnar styloid process 22.6  Just distal to the ulnar styloid process 17.15  Across hand at thumb web space 19.7  At base of 2nd digit 6.6  (Blank rows = not tested)  Chest circumference just inferior to the axillae:  Chest circumference at the largest point:       L-DEX LYMPHEDEMA SCREENING:     QUICK DASH SURVEY: 59%                                                                                                                            TREATMENT DATE:   11/14/2024 Pulleys x 2 min flex and abduction Ball rolls on wall x 5 forward and 5 left abd Supine on half foam roll;alternate shoulder flexion x 5,bilateral scaption, horizontal abd, snow angels x 5   11/06/2024 Pulleys x 2 min flex and abduction Ball rolls on wall x 5 forward and 5  left abd 3 D AROM x 5 ea, alternate flexion, bilateral scaption, bilateral horizontal abd, snow  angels x 5 Star gazer x 5 Cording release left UE STM left UT and pectorals, lateral trunk with cocoa butter PROM left shoulder flex, scaption, abd, ER with VC's to relax  11/01/2024 Pt educated in exs listed below performing each x 4-5 repetitions to decrease shoulder/chest tightness and cording. TG soft sleeve cut to assist with cording/keeping her arm warm. Discussed POC, LOS, treatment intervetions,No show policy    PATIENT EDUCATION:  Education details: Access Code: [email or web address] URL: [email or web address] Date: 11/01/2024 Prepared by: Grayce Sheldon  Exercises - Supine Shoulder Flexion Extension AAROM with Dowel  - 2 x daily - 7 x weekly - 1 sets - 5 reps - 5 hold - Single Arm Doorway Pec Stretch at 90 Degrees Abduction  - 2 x daily - 7 x weekly - 1 sets - 3 reps - 20 hold Supine stargazer, standing shoulder wall slide for abduction Person educated: Patient Education method: Explanation, Demonstration, and Handouts Education comprehension: verbalized understanding and returned demonstration  HOME EXERCISE PROGRAM: - Supine Shoulder Flexion Extension AAROM with Dowel    - Single Arm Doorway Pec Stretch at 90 Degrees Abduction   Supine stargazer, standing shoulder wall slide for abduction  ASSESSMENT:  CLINICAL IMPRESSION: TG soft has been a big help with achiness in UE. Very tight in pectorals; clavicular and axillary border noted with STM today.  ROM visibly improved by end of session.   EVAL Patient is a 38 y.o. female who was seen today for physical therapy evaluation and treatment for complaints of left UE tightness/pain/ achiness present since the end of November. She has visible and palpable cording in the axilla, with tightness felt throughout the forearm. Her left pectorals are very tight and her left shoulder ROM is restricted. She does not have any measurable  edema today and her SOZO screen was in the green. She was educated in Left shoulder/pec stretches to start for HEP. She will benefit from skilled PT to address cording and ROM deficits to help decrease the achiness/pain in her left UE.   OBJECTIVE IMPAIRMENTS: decreased activity tolerance, decreased knowledge of condition, decreased mobility, decreased ROM, increased fascial restrictions, impaired UE functional use, postural dysfunction, and pain.   ACTIVITY LIMITATIONS: carrying, lifting, and reach over head  PARTICIPATION LIMITATIONS: cleaning, occupation, and church  PERSONAL FACTORS: 3+ comorbidities: neoadjuvant chemo, L.Breast surgery with SLNB,radiation are also affecting patient's functional outcome.   REHAB POTENTIAL: Excellent  CLINICAL DECISION MAKING: Stable/uncomplicated  EVALUATION COMPLEXITY: Low  GOALS: Goals reviewed with patient? Yes  SHORT TERM GOALS=LONG TERM GOALS: Target date: 12/14/2023  Patient will demonstrate independence in HEP to decrease pain and improve ROM  Baseline: Goal status: INITIAL  2.  Patient will increase left shoulder active abduction to >/= 170 degrees for increased ease reaching  Baseline:  Goal status: INITIAL  3.  Patient will report >/= 50% decrease in pain/tightness for increased tolerance of daily tasks  Baseline:  Goal status: INITIAL  4.  Quick dash will improve to no greater than 18% to demonstrate improved function   Baseline 59%   Goal status: INITIAL PLAN:  PT FREQUENCY: 2x/week  PT DURATION: 6 weeks  PLANNED INTERVENTIONS: 97164- PT Re-evaluation, 97750- Physical Performance Testing, 97110-Therapeutic exercises, 97530- Therapeutic activity, [id number]- Neuromuscular re-education, 97535- Self Care, [id number]- Manual therapy, 97760- Orthotic Initial, and [id number]- Orthotic/Prosthetic subsequent  PLAN FOR NEXT SESSION: Review HEP, Cording release left UE,  cupping prn,PROM, pec stretches, supine AROM progress to half roll etc, MLD  prn  Grayce JINNY Sheldon, PT  11/14/2024, 11:19 AM  "

## 2024-11-15 ENCOUNTER — Encounter: Payer: Self-pay | Admitting: Hematology and Oncology

## 2024-11-16 ENCOUNTER — Encounter: Payer: Self-pay | Admitting: Hematology and Oncology

## 2024-11-19 ENCOUNTER — Ambulatory Visit: Attending: Hematology and Oncology

## 2024-11-19 DIAGNOSIS — Z483 Aftercare following surgery for neoplasm: Secondary | ICD-10-CM | POA: Insufficient documentation

## 2024-11-19 DIAGNOSIS — C50412 Malignant neoplasm of upper-outer quadrant of left female breast: Secondary | ICD-10-CM | POA: Diagnosis present

## 2024-11-19 DIAGNOSIS — M25612 Stiffness of left shoulder, not elsewhere classified: Secondary | ICD-10-CM | POA: Diagnosis present

## 2024-11-19 DIAGNOSIS — L905 Scar conditions and fibrosis of skin: Secondary | ICD-10-CM | POA: Insufficient documentation

## 2024-11-19 DIAGNOSIS — L7682 Other postprocedural complications of skin and subcutaneous tissue: Secondary | ICD-10-CM | POA: Diagnosis present

## 2024-11-19 NOTE — Therapy (Signed)
 " OUTPATIENT PHYSICAL THERAPY  UPPER EXTREMITY ONCOLOGY EVALUATION  Patient Name: Jeanne Evans MRN: [id number] DOB:09-14-1986, 39 y.o., female Today's Date: 11/19/2024  END OF SESSION:  PT End of Session - 11/19/24 0904     Visit Number 4    Number of Visits 12    Date for Recertification  12/13/24    Authorization Type UHC commercial and Medicaid    Authorization Time Period [facility] 12/18-1/29/2026    Authorization - Visit Number 4    Authorization - Number of Visits 12    PT Start Time 0905    PT Stop Time 0956    PT Time Calculation (min) 51 min    Activity Tolerance Patient tolerated treatment well    Behavior During Therapy [facility] for tasks assessed/performed          Past Medical History:  Diagnosis Date   Asthma 07/31/2021   Breast cancer (HCC)    Family history of ovarian cancer    Family history of prostate cancer    GERD (gastroesophageal reflux disease)    History of asthma    has albuterol , uses PRN   History of heart murmur in childhood    History of multiple allergies    Past Surgical History:  Procedure Laterality Date   BREAST LUMPECTOMY Left 11/2020   BREAST LUMPECTOMY WITH RADIOACTIVE SEED AND SENTINEL LYMPH NODE BIOPSY Left 11/19/2020   Procedure: LEFT BREAST LUMPECTOMY X 2 WITH RADIOACTIVE SEED AND SENTINEL LYMPH NODE MAPPING;  Surgeon: Vanderbilt Ned, MD;  Location: [facility];  Service: General;  Laterality: Left;  PECTORAL BLOCK   BREAST REDUCTION SURGERY Right 10/27/2021   Procedure: MAMMARY REDUCTION  TO RIGHT BREAST;  Surgeon: Elisabeth Craig RAMAN, MD;  Location: [facility];  Service: Plastics;  Laterality: Right;   LIPOSUCTION WITH LIPOFILLING Bilateral 10/27/2021   Procedure: BILATERAL FAT GRAFTING TO BREAST;  Surgeon: Elisabeth Craig RAMAN, MD;  Location: [facility];  Service: Plastics;  Laterality: Bilateral;   PORTACATH PLACEMENT Right 06/18/2020   Procedure: INSERTION PORT-A-CATH WITH ULTRASOUND GUIDANCE;   Surgeon: Vanderbilt Ned, MD;  Location: [facility];  Service: General;  Laterality: Right;   RE-EXCISION OF BREAST LUMPECTOMY Left 12/09/2020   Procedure: RE-EXCISION OF LEFT BREAST LUMPECTOMY;  Surgeon: Vanderbilt Ned, MD;  Location:  [facility];  Service: General;  Laterality: Left;   REDUCTION MAMMAPLASTY Right    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   Lichen planus 01/21/2023   Gastroesophageal reflux disease without esophagitis 01/21/2023   Other allergic rhinitis 01/21/2023   Port-A-Cath in place 03/11/2022   Acute respiratory failure with hypoxemia (HCC) 02/03/2022   Acute respiratory failure with hypoxia (HCC) 02/03/2022   Not well controlled asthma without complication 07/31/2021   Lymphedema of left arm 02/02/2021   Drug-induced neutropenia 09/30/2020   Genetic testing 06/26/2020   Family history of ovarian cancer    Family history of prostate cancer    Malignant neoplasm of upper-outer quadrant of left breast in female, estrogen receptor negative (HCC) 06/03/2020      REFERRING PROVIDER: Amber Stalls, MD  REFERRING DIAG: Left arm pain/tightness  THERAPY DIAG:  Malignant neoplasm of upper-outer quadrant of left female breast, unspecified estrogen receptor status (HCC)  Aftercare following surgery for neoplasm  Stiffness of left shoulder, not elsewhere classified  Axillary web syndrome  ONSET DATE: Nov 26,2025  Rationale for Evaluation and Treatment: Rehabilitation  SUBJECTIVE:                                                                                                                                                                                            SUBJECTIVE STATEMENT:   My arm is doing better. It didn't hurt this past weekend, but I was so busy I wouldn't have noticed. A little sore and tight in the left axilla today. The TG soft helps with the achiness. I haven't had too much.  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 My arm started aching to the bone. I was running a fever, and cold air made it ache. I went to UC they didn't see swelling or anything. My armpit was tight and I was doing MLD. They didn't find anything. I went to the ED the next day and I had a UTI. The achiness went away  some when I got the antibiotics and ibuprofen . I still get the achiness, but not as often. It can still be very painful though, especially when its cold. When I saw Dr. Loretha she wanted me to come see you to be checked.  PERTINENT HISTORY:  Patient was diagnosed on 05/12/2020 with left breast cancer. She underwent neoadjuvant chemotherapy 06/19/2020-09/23/2020 and had a left lumpectomy and sentinel node biopsy (6 negative nodes) on 11/19/2020. 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 Numbness in posterior arm is a little better. She had a right breast reduction and left Mastopexy on 10/27/2021  PAIN:  Are you having pain? Yes NPRS scale: 0/10 present-8/10 at worst Pain location: entire left UE/axilla, cording Pain orientation: Left  PAIN TYPE: aching, throbbing, and tight, occasional numbness, tingle Pain description: intermittent and aching  Aggravating factors: cold weather,  Relieving factors: massage,  PRECAUTIONS: Left UE lymphedema risk, Sarcoidosis, uveitis  RED FLAGS: None   WEIGHT BEARING RESTRICTIONS: No  FALLS:  Has patient fallen in last 6 months? No  LIVING ENVIRONMENT: Lives with: lives with their family Lives in: House/apartment Stairs: No;    OCCUPATION: custodial cleaning/writing bonds  LEISURE: cook, shop  HAND DOMINANCE: right   PRIOR LEVEL OF FUNCTION:  Independent  PATIENT GOALS: Decrease pain,  improve ROM   OBJECTIVE: Note: Objective measures were completed at Evaluation unless otherwise noted.  COGNITION: Overall cognitive status: Within functional limits for tasks assessed   PALPATION: Palpable cording in axilla, and mildly in forearm  OBSERVATIONS /  OTHER ASSESSMENTS: cording noted left axilla and running down into left forearm  SENSATION: Light touch: Deficits     POSTURE: Forward head, rounded shouders  UPPER EXTREMITY AROM/PROM:  A/PROM RIGHT   eval  Right 11/19/2024  Shoulder extension    Shoulder flexion 155 157  Shoulder abduction 176 157  Shoulder internal rotation 60   Shoulder external rotation 92     (Blank rows = not tested)  A/PROM LEFT   eval LEFT 11/19/2024  Shoulder extension    Shoulder flexion 156 160  Shoulder abduction 158 158  Shoulder internal rotation 45   Shoulder external rotation 86     (Blank rows = not tested)  CERVICAL AROM: All within normal limits:      UPPER EXTREMITY STRENGTH:   LYMPHEDEMA ASSESSMENTS:   SURGERY TYPE/DATE: left lumpectomy and sentinel node biopsy (6 negative nodes) on 11/19/2020. right breast reduction and left Mastopexy on 10/27/2021  NUMBER OF LYMPH NODES REMOVED: 0+/6  CHEMOTHERAPY: YES  RADIATION:YES  HORMONE TREATMENT: NO     LYMPHEDEMA ASSESSMENTS:   LANDMARK RIGHT  eval  At axilla  39.1  15 cm proximal to the proximal aspect of the olecranon process 39  10 cm proximal to the proximal aspect of the olecranon process 38  Olecranon process 30  15 cm proximal to the proximal aspect of the ulnar styloid process 28.0  10 cm proximal to the proximal aspect of the ulnar styloid process 24.1  Just distal to the ulnar styloid process 17.5  Across hand at thumb web space 20.1  At base of 2nd digit 6.8  (Blank rows = not tested)  LANDMARK LEFT  eval  At axilla  37.3  15 cm proximal to the proximal aspect of the olecranon process 38.9   10 cm proximal to the proximal aspect of the olecranon process 37.5  Olecranon process 29.0  15 cm proximal to the proximal aspect of the ulnar styloid process 27.1  10 cm proximal to  the proximal aspect of the ulnar styloid process 22.6  Just distal to the ulnar styloid process 17.15  Across hand at thumb web space 19.7  At base of 2nd digit 6.6  (Blank rows = not tested)  Chest circumference just inferior to the axillae:  Chest circumference at the largest point:       L-DEX LYMPHEDEMA SCREENING:     QUICK DASH SURVEY: 59%                                                                                                                            TREATMENT DATE:   11/19/2024 Ball rolls on wall x  forward and 5 left abd Left shoulder abd with pulleys x 3 min 3 D AROM x 5 ea, bilateral flexion, bilateral scaption, bilateral horizontal abd, snow angels x 5 Cording release left UE STM left UT and pectorals, lateral trunk with cocoa butter PROM left shoulder flex, scaption, abd, ER with VC's to  relax Measured AROM:True abd Bilaterally about the same.   11/14/2024  Pulleys x 2 min flex and abduction Ball rolls on wall x  forward and 5 left abd Half foam roll :3 D AROM x 5 ea, alternate flexion, bilateral scaption, bilateral horizontal abd, snow angels x 5 Cording release left UE STM left UT and pectorals, lateral trunk with cocoa butter PROM left shoulder flex, scaption, abd, ER with VC's to relax  11/06/2024 Pulleys x 2 min flex and abduction Ball rolls on wall x 10 forward and 5 left abd 3 D AROM x 5 ea, alternate flexion, bilateral scaption, bilateral horizontal abd, snow angels x 5 Star gazer x 5 Cording release left UE STM left UT and pectorals, lateral trunk with cocoa butter PROM left shoulder flex, scaption, abd, ER with VC's to relax  11/01/2024 Pt educated in exs listed below performing each x 4-5 repetitions to decrease shoulder/chest tightness and cording.  TG soft sleeve cut to assist with cording/keeping her arm warm. Discussed POC, LOS, treatment intervetions,No show policy    PATIENT EDUCATION:  Education details: Access Code: [email or web address] URL: [email or web address] Date: 11/01/2024 Prepared by: Grayce Sheldon  Exercises - Supine Shoulder Flexion Extension AAROM with Dowel  - 2 x daily - 7 x weekly - 1 sets - 5 reps - 5 hold - Single Arm Doorway Pec Stretch at 90 Degrees Abduction  - 2 x daily - 7 x weekly - 1 sets - 3 reps - 20 hold Supine stargazer, standing shoulder wall slide for abduction Person educated: Patient Education method: Explanation, Demonstration, and Handouts Education comprehension: verbalized understanding and returned demonstration  HOME EXERCISE PROGRAM: - Supine Shoulder Flexion Extension AAROM with Dowel    - Single Arm Doorway Pec Stretch at 90 Degrees Abduction   Supine stargazer, standing shoulder wall slide for abduction  ASSESSMENT:  CLINICAL IMPRESSION: Pt continues with tightness in left pecs/axillary region. Cording in axilla still present and tender for pt, but improved after therapy.  Cording tightness felt with true abduction but able to get to same ROM as right UE.  EVAL Patient is a 39 y.o. female who was seen today for physical therapy evaluation and treatment for complaints of left UE tightness/pain/ achiness present since the end of November. She has visible and palpable cording in the axilla, with tightness felt throughout the forearm. Her left pectorals are very tight and her left shoulder ROM is restricted. She does not have any measurable edema today and her SOZO screen was in the green. She was educated in Left shoulder/pec stretches to start for HEP. She will benefit from skilled PT to address cording and ROM deficits to help decrease the achiness/pain in her left UE.   OBJECTIVE IMPAIRMENTS: decreased activity tolerance, decreased knowledge of condition, decreased mobility,  decreased ROM, increased fascial restrictions, impaired UE functional use, postural dysfunction, and pain.   ACTIVITY LIMITATIONS: carrying, lifting, and reach over head  PARTICIPATION LIMITATIONS: cleaning, occupation, and church  PERSONAL FACTORS: 3+ comorbidities: neoadjuvant chemo, L.Breast surgery with SLNB,radiation are also affecting patient's functional outcome.   REHAB POTENTIAL: Excellent  CLINICAL DECISION MAKING: Stable/uncomplicated  EVALUATION COMPLEXITY: Low  GOALS: Goals reviewed with patient? Yes  SHORT TERM GOALS=LONG TERM GOALS: Target date: 12/14/2023  Patient will demonstrate independence in HEP to decrease pain and improve ROM  Baseline: Goal status: INITIAL  2.  Patient will increase left shoulder active abduction to >/= 170 degrees for increased ease reaching  Baseline:  Goal status: INITIAL  3.  Patient  will report >/= 50% decrease in pain/tightness for increased tolerance of daily tasks  Baseline:  Goal status: INITIAL  4.  Quick dash will improve to no greater than 18% to demonstrate improved function   Baseline 59%   Goal status: INITIAL PLAN:  PT FREQUENCY: 2x/week  PT DURATION: 6 weeks  PLANNED INTERVENTIONS: 97164- PT Re-evaluation, 97750- Physical Performance Testing, 97110-Therapeutic exercises, 97530- Therapeutic activity, 97112- Neuromuscular re-education, 97535- Self Care, [id number]- Manual therapy, 97760- Orthotic Initial, and [phone]- Orthotic/Prosthetic subsequent  PLAN FOR NEXT SESSION: Review HEP, Cording release left UE,  cupping prn,PROM, pec stretches, supine AROM progress to half roll etc, MLD prn  Grayce JINNY Sheldon, PT 11/19/2024, 9:59 AM  OUTPATIENT PHYSICAL THERAPY  UPPER EXTREMITY ONCOLOGY EVALUATION  Patient Name: Jeanne Evans MRN: [id number] DOB:Aug 02, 1986, 39 y.o., female Today's Date: 11/19/2024  END OF SESSION:  PT End of Session - 11/19/24 0904     Visit Number 4    Number of Visits 12    Date for  Recertification  12/13/24    Authorization Type UHC commercial and Medicaid    Authorization Time Period [facility] 12/18-1/29/2026    Authorization - Visit Number 4    Authorization - Number of Visits 12    PT Start Time 0905    PT Stop Time 0956    PT Time Calculation (min) 51 min    Activity Tolerance Patient tolerated treatment well    Behavior During Therapy [facility] for tasks assessed/performed          Past Medical History:  Diagnosis Date   Asthma 07/31/2021   Breast cancer (HCC)    Family history of ovarian cancer    Family history of prostate cancer    GERD (gastroesophageal reflux disease)    History of asthma    has albuterol , uses PRN   History of heart murmur in childhood    History of multiple allergies    Past Surgical History:  Procedure Laterality Date   BREAST LUMPECTOMY Left 11/2020   BREAST LUMPECTOMY WITH RADIOACTIVE SEED AND SENTINEL LYMPH NODE BIOPSY Left 11/19/2020   Procedure: LEFT BREAST LUMPECTOMY X 2 WITH RADIOACTIVE SEED AND SENTINEL LYMPH NODE MAPPING;  Surgeon: Vanderbilt Ned, MD;  Location: [facility];  Service: General;  Laterality: Left;  PECTORAL BLOCK   BREAST REDUCTION SURGERY Right 10/27/2021   Procedure: MAMMARY REDUCTION  TO RIGHT BREAST;  Surgeon: Elisabeth Craig RAMAN, MD;  Location: [facility];  Service: Plastics;  Laterality: Right;   LIPOSUCTION WITH LIPOFILLING Bilateral 10/27/2021   Procedure: BILATERAL FAT GRAFTING TO BREAST;  Surgeon: Elisabeth Craig RAMAN, MD;  Location: [facility];  Service: Plastics;  Laterality: Bilateral;   PORTACATH PLACEMENT Right 06/18/2020   Procedure: INSERTION PORT-A-CATH WITH ULTRASOUND GUIDANCE;  Surgeon: Vanderbilt Ned, MD;  Location: [facility];  Service: General;  Laterality: Right;   RE-EXCISION OF BREAST LUMPECTOMY Left 12/09/2020   Procedure: RE-EXCISION OF LEFT BREAST LUMPECTOMY;  Surgeon: Vanderbilt Ned, MD;  Location: [facility];   Service: General;  Laterality: Left;   REDUCTION MAMMAPLASTY Right    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   Lichen planus 01/21/2023   Gastroesophageal reflux disease without esophagitis 01/21/2023   Other allergic  rhinitis 01/21/2023   Port-A-Cath in place 03/11/2022   Acute respiratory failure with hypoxemia (HCC) 02/03/2022   Acute respiratory failure with hypoxia (HCC) 02/03/2022   Not well controlled asthma without complication 07/31/2021   Lymphedema of left arm 02/02/2021   Drug-induced neutropenia 09/30/2020   Genetic testing 06/26/2020   Family history of ovarian cancer    Family history of prostate cancer    Malignant neoplasm of upper-outer quadrant of left breast in female, estrogen receptor negative (HCC) 06/03/2020      REFERRING PROVIDER: Amber Stalls, MD  REFERRING DIAG: Left arm pain/tightness  THERAPY DIAG:  Malignant neoplasm of upper-outer quadrant of left female breast, unspecified estrogen receptor status (HCC)  Aftercare following surgery for neoplasm  Stiffness of left shoulder, not elsewhere classified  Axillary web syndrome  ONSET DATE: Nov 26,2025  Rationale for Evaluation and Treatment: Rehabilitation  SUBJECTIVE:                                                                                                                                                                                           SUBJECTIVE STATEMENT:   Did pretty well after last visit. The armpit area is still very sore. I haven't done much since I was here last.  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 My arm started aching to the bone. I was running a fever, and cold air made it  ache. I went to UC they didn't see swelling or anything. My armpit was tight and I was doing MLD. They didn't find anything. I went to the ED the next day and I had a UTI. The achiness went away  some when I got the antibiotics and ibuprofen . I still get the achiness, but not as often. It can still be very painful though, especially when its cold. When I saw Dr. Stalls she wanted me to come see you to be checked.  PERTINENT HISTORY:  Patient was diagnosed on 05/12/2020 with left breast cancer. She underwent neoadjuvant chemotherapy 06/19/2020-09/23/2020 and had a left lumpectomy and sentinel node biopsy (6 negative nodes) on 11/19/2020. 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 Numbness in posterior arm is a little better. She had a right breast reduction and left Mastopexy on 10/27/2021  PAIN:  Are you having pain? Yes NPRS scale: 0/10 present-6-7/10 at  with tenderness to touch, still gets a cold feeling Pain location: left axilla, cording Pain orientation: Left  PAIN TYPE: aching, throbbing, and tight, occasional numbness, tingle, coldness Pain description: intermittent and aching  Aggravating factors: cold weather,  Relieving factors: massage,  PRECAUTIONS:  Left UE lymphedema risk, Sarcoidosis, uveitis  RED FLAGS: None   WEIGHT BEARING RESTRICTIONS: No  FALLS:  Has patient fallen in last 6 months? No  LIVING ENVIRONMENT: Lives with: lives with their family Lives in: House/apartment Stairs: No;    OCCUPATION: custodial cleaning/writing bonds  LEISURE: cook, shop  HAND DOMINANCE: right   PRIOR LEVEL OF FUNCTION: Independent  PATIENT GOALS: Decrease pain,  improve ROM   OBJECTIVE: Note: Objective measures were completed at Evaluation unless otherwise noted.  COGNITION: Overall cognitive status: Within functional limits for tasks assessed   PALPATION: Palpable cording in axilla, and mildly in forearm  OBSERVATIONS / OTHER ASSESSMENTS: cording noted left axilla  and running down into left forearm  SENSATION: Light touch: Deficits     POSTURE: Forward head, rounded shouders  UPPER EXTREMITY AROM/PROM:  A/PROM RIGHT   eval   Shoulder extension   Shoulder flexion 155  Shoulder abduction 176  Shoulder internal rotation 60  Shoulder external rotation 92    (Blank rows = not tested)  A/PROM LEFT   eval  Shoulder extension   Shoulder flexion 156  Shoulder abduction 158  Shoulder internal rotation 45  Shoulder external rotation 86    (Blank rows = not tested)  CERVICAL AROM: All within normal limits:      UPPER EXTREMITY STRENGTH:   LYMPHEDEMA ASSESSMENTS:   SURGERY TYPE/DATE: left lumpectomy and sentinel node biopsy (6 negative nodes) on 11/19/2020. right breast reduction and left Mastopexy on 10/27/2021  NUMBER OF LYMPH NODES REMOVED: 0+/6  CHEMOTHERAPY: YES  RADIATION:YES  HORMONE TREATMENT: NO     LYMPHEDEMA ASSESSMENTS:   LANDMARK RIGHT  eval  At axilla  39.1  15 cm proximal to the proximal aspect of the olecranon process 39  10 cm proximal to the proximal aspect of the olecranon process 38  Olecranon process 30  15 cm proximal to the proximal aspect of the ulnar styloid process 28.0  10 cm proximal to the proximal aspect of the ulnar styloid process 24.1  Just distal to the ulnar styloid process 17.5  Across hand at thumb web space 20.1  At base of 2nd digit 6.8  (Blank rows = not tested)  LANDMARK LEFT  eval  At axilla  37.3  15 cm proximal to the proximal aspect of the olecranon process 38.9  10 cm proximal to the proximal aspect of the olecranon process 37.5  Olecranon process 29.0  15 cm proximal to the proximal aspect of the ulnar styloid process 27.1  10 cm proximal to  the proximal aspect of the ulnar styloid process 22.6  Just distal to the ulnar styloid process 17.15  Across hand at thumb web space 19.7  At base of 2nd digit 6.6  (Blank rows = not tested)  Chest circumference just inferior  to the axillae:  Chest circumference at the largest point:       L-DEX LYMPHEDEMA SCREENING:     QUICK DASH SURVEY: 59%  TREATMENT DATE:   11/19/2024 Ball rolls on wall x 5 forward and 5 abduction on left Pulleys for abduction x 3 min  11/14/2024 Pulleys x 2 min flex and abduction Ball rolls on wall x 5 forward and 5 left abd Supine on half foam roll;alternate shoulder flexion x 5,bilateral scaption, horizontal abd, snow angels x 5 Cording release left UE STM left UT and pectorals, lateral trunk with cocoa butter PROM left shoulder flex, scaption, abd, ER with VC's to relax  11/06/2024 Pulleys x 2 min flex and abduction Ball rolls on wall x 5 forward and 5 left abd 3 D AROM x 5 ea, alternate flexion, bilateral scaption, bilateral horizontal abd, snow angels x 5 Star gazer x 5 Cording release left UE STM left UT and pectorals, lateral trunk with cocoa butter PROM left shoulder flex, scaption, abd, ER with VC's to relax  11/01/2024 Pt educated in exs listed below performing each x 4-5 repetitions to decrease shoulder/chest tightness and cording. TG soft sleeve cut to assist with cording/keeping her arm warm. Discussed POC, LOS, treatment intervetions,No show policy    PATIENT EDUCATION:  Education details: Access Code: [email or web address] URL: [email or web address] Date: 11/01/2024 Prepared by: Grayce Sheldon  Exercises - Supine Shoulder Flexion Extension AAROM with Dowel  - 2 x daily - 7 x weekly - 1 sets - 5 reps - 5 hold - Single Arm Doorway Pec Stretch at 90 Degrees Abduction  - 2 x daily - 7 x weekly - 1 sets - 3 reps - 20 hold Supine stargazer, standing shoulder wall slide for abduction Person educated: Patient Education method: Explanation, Demonstration, and Handouts Education comprehension: verbalized understanding and returned  demonstration  HOME EXERCISE PROGRAM: - Supine Shoulder Flexion Extension AAROM with Dowel    - Single Arm Doorway Pec Stretch at 90 Degrees Abduction   Supine stargazer, standing shoulder wall slide for abduction  ASSESSMENT:  CLINICAL IMPRESSION: TG soft has been a big help with achiness in UE. Very tight in pectorals; clavicular and axillary border noted with STM today.  ROM visibly improved by end of session.   EVAL Patient is a 39 y.o. female who was seen today for physical therapy evaluation and treatment for complaints of left UE tightness/pain/ achiness present since the end of November. She has visible and palpable cording in the axilla, with tightness felt throughout the forearm. Her left pectorals are very tight and her left shoulder ROM is restricted. She does not have any measurable edema today and her SOZO screen was in the green. She was educated in Left shoulder/pec stretches to start for HEP. She will benefit from skilled PT to address cording and ROM deficits to help decrease the achiness/pain in her left UE.   OBJECTIVE IMPAIRMENTS: decreased activity tolerance, decreased knowledge of condition, decreased mobility, decreased ROM, increased fascial restrictions, impaired UE functional use, postural dysfunction, and pain.   ACTIVITY LIMITATIONS: carrying, lifting, and reach over head  PARTICIPATION LIMITATIONS: cleaning, occupation, and church  PERSONAL FACTORS: 3+ comorbidities: neoadjuvant chemo, L.Breast surgery with SLNB,radiation are also affecting patient's functional outcome.   REHAB POTENTIAL: Excellent  CLINICAL DECISION MAKING: Stable/uncomplicated  EVALUATION COMPLEXITY: Low  GOALS: Goals reviewed with patient? Yes  SHORT TERM GOALS=LONG TERM GOALS: Target date: 12/14/2023  Patient will demonstrate independence in HEP to decrease pain and improve ROM  Baseline: Goal status: INITIAL  2.  Patient will increase left shoulder active abduction to >/= 170  degrees for increased ease reaching  Baseline:  Goal status: INITIAL  3.  Patient will report >/= 50% decrease in pain/tightness for increased tolerance of daily tasks  Baseline:  Goal status: INITIAL  4.  Quick dash will improve to no greater than 18% to demonstrate improved function   Baseline 59%   Goal status: INITIAL PLAN:  PT FREQUENCY: 2x/week  PT DURATION: 6 weeks  PLANNED INTERVENTIONS: 97164- PT Re-evaluation, 97750- Physical Performance Testing, 97110-Therapeutic exercises, 97530- Therapeutic activity, [id number]- Neuromuscular re-education, 97535- Self Care, [id number]- Manual therapy, 97760- Orthotic Initial, and [id number]- Orthotic/Prosthetic subsequent  PLAN FOR NEXT SESSION: Review HEP, Cording release left UE,  cupping prn,PROM, pec stretches, supine AROM progress to half roll etc, MLD prn  Grayce JINNY Sheldon, PT 11/19/2024, 9:59 AM  "

## 2024-11-21 ENCOUNTER — Ambulatory Visit

## 2024-11-21 DIAGNOSIS — M25612 Stiffness of left shoulder, not elsewhere classified: Secondary | ICD-10-CM

## 2024-11-21 DIAGNOSIS — C50412 Malignant neoplasm of upper-outer quadrant of left female breast: Secondary | ICD-10-CM | POA: Diagnosis not present

## 2024-11-21 DIAGNOSIS — L905 Scar conditions and fibrosis of skin: Secondary | ICD-10-CM

## 2024-11-21 DIAGNOSIS — Z483 Aftercare following surgery for neoplasm: Secondary | ICD-10-CM

## 2024-11-21 NOTE — Therapy (Signed)
 " OUTPATIENT PHYSICAL THERAPY  UPPER EXTREMITY ONCOLOGY EVALUATION  Patient Name: Jeanne Evans MRN: [id number] DOB:17-Jun-1986, 39 y.o., female Today's Date: 11/21/2024  END OF SESSION:  PT End of Session - 11/21/24 1000     Visit Number 5    Number of Visits 12    Date for Recertification  12/13/24    Authorization Type UHC commercial and Medicaid    Authorization Time Period [facility] 12/18-1/29/2026    Authorization - Visit Number 5    Authorization - Number of Visits 12    PT Start Time 1000    PT Stop Time 1059    PT Time Calculation (min) 59 min    Activity Tolerance Patient tolerated treatment well    Behavior During Therapy [facility] for tasks assessed/performed          Past Medical History:  Diagnosis Date   Asthma 07/31/2021   Breast cancer (HCC)    Family history of ovarian cancer    Family history of prostate cancer    GERD (gastroesophageal reflux disease)    History of asthma    has albuterol , uses PRN   History of heart murmur in childhood    History of multiple allergies    Past Surgical History:  Procedure Laterality Date   BREAST LUMPECTOMY Left 11/2020   BREAST LUMPECTOMY WITH RADIOACTIVE SEED AND SENTINEL LYMPH NODE BIOPSY Left 11/19/2020   Procedure: LEFT BREAST LUMPECTOMY X 2 WITH RADIOACTIVE SEED AND SENTINEL LYMPH NODE MAPPING;  Surgeon: Vanderbilt Ned, MD;  Location: [facility];  Service: General;  Laterality: Left;  PECTORAL BLOCK   BREAST REDUCTION SURGERY Right 10/27/2021   Procedure: MAMMARY REDUCTION  TO RIGHT BREAST;  Surgeon: Elisabeth Craig RAMAN, MD;  Location: [facility];  Service: Plastics;  Laterality: Right;   LIPOSUCTION WITH LIPOFILLING Bilateral 10/27/2021   Procedure: BILATERAL FAT GRAFTING TO BREAST;  Surgeon: Elisabeth Craig RAMAN, MD;  Location: [facility];  Service: Plastics;  Laterality: Bilateral;   PORTACATH PLACEMENT Right 06/18/2020   Procedure: INSERTION PORT-A-CATH WITH ULTRASOUND GUIDANCE;   Surgeon: Vanderbilt Ned, MD;  Location: [facility];  Service: General;  Laterality: Right;   RE-EXCISION OF BREAST LUMPECTOMY Left 12/09/2020   Procedure: RE-EXCISION OF LEFT BREAST LUMPECTOMY;  Surgeon: Vanderbilt Ned, MD;  Location: [facility];  Service: General;  Laterality: Left;   REDUCTION MAMMAPLASTY Right    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   Lichen planus 01/21/2023   Gastroesophageal reflux disease without esophagitis 01/21/2023   Other allergic rhinitis 01/21/2023   Port-A-Cath in place 03/11/2022   Acute respiratory failure with hypoxemia (HCC) 02/03/2022   Acute respiratory failure with hypoxia (HCC) 02/03/2022   Not well controlled asthma without complication 07/31/2021   Lymphedema of left arm 02/02/2021   Drug-induced neutropenia 09/30/2020   Genetic testing 06/26/2020   Family history of ovarian cancer    Family history of prostate cancer    Malignant neoplasm of upper-outer quadrant of left breast in female, estrogen receptor negative (HCC) 06/03/2020      REFERRING PROVIDER: Amber Stalls, MD  REFERRING DIAG: Left arm pain/tightness  THERAPY DIAG:  Malignant neoplasm of upper-outer quadrant of left female breast, unspecified estrogen receptor status (HCC)  Aftercare following surgery for neoplasm  Stiffness of left shoulder, not elsewhere classified  Axillary web syndrome  ONSET DATE: Nov 26,2025  Rationale for Evaluation and Treatment: Rehabilitation  SUBJECTIVE:                                                                                                                                                                                            SUBJECTIVE STATEMENT:   My arm is doing better. It didn't hurt this past weekend, but I was so busy I wouldn't have noticed. A little sore and tight in the left axilla today. The TG soft helps with the achiness. I haven't had too much.  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 My arm started aching to the bone. I was running a fever, and cold air made it ache. I went to UC they didn't see swelling or anything. My armpit was tight and I was doing MLD. They didn't find anything. I went to the ED the next day and I had a UTI. The achiness went away  some when I got the antibiotics and ibuprofen . I still get the achiness, but not as often. It can still be very painful though, especially when its cold. When I saw Dr. Loretha she wanted me to come see you to be checked.  PERTINENT HISTORY:  Patient was diagnosed on 05/12/2020 with left breast cancer. She underwent neoadjuvant chemotherapy 06/19/2020-09/23/2020 and had a left lumpectomy and sentinel node biopsy (6 negative nodes) on 11/19/2020. 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 Numbness in posterior arm is a little better. She had a right breast reduction and left Mastopexy on 10/27/2021  PAIN:  Are you having pain? Yes NPRS scale: 0/10 present-8/10 at worst Pain location: entire left UE/axilla, cording Pain orientation: Left  PAIN TYPE: aching, throbbing, and tight, occasional numbness, tingle Pain description: intermittent and aching  Aggravating factors: cold weather,  Relieving factors: massage,  PRECAUTIONS: Left UE lymphedema risk, Sarcoidosis, uveitis  RED FLAGS: None   WEIGHT BEARING RESTRICTIONS: No  FALLS:  Has patient fallen in last 6 months? No  LIVING ENVIRONMENT: Lives with: lives with their family Lives in: House/apartment Stairs: No;    OCCUPATION: custodial cleaning/writing bonds  LEISURE: cook, shop  HAND DOMINANCE: right   PRIOR LEVEL OF FUNCTION:  Independent  PATIENT GOALS: Decrease pain,  improve ROM   OBJECTIVE: Note: Objective measures were completed at Evaluation unless otherwise noted.  COGNITION: Overall cognitive status: Within functional limits for tasks assessed   PALPATION: Palpable cording in axilla, and mildly in forearm  OBSERVATIONS /  OTHER ASSESSMENTS: cording noted left axilla and running down into left forearm  SENSATION: Light touch: Deficits     POSTURE: Forward head, rounded shouders  UPPER EXTREMITY AROM/PROM:  A/PROM RIGHT   eval  Right 11/19/2024  Shoulder extension    Shoulder flexion 155 157  Shoulder abduction 176 157  Shoulder internal rotation 60   Shoulder external rotation 92     (Blank rows = not tested)  A/PROM LEFT   eval LEFT 11/19/2024  Shoulder extension    Shoulder flexion 156 160  Shoulder abduction 158 158  Shoulder internal rotation 45   Shoulder external rotation 86     (Blank rows = not tested)  CERVICAL AROM: All within normal limits:      UPPER EXTREMITY STRENGTH:   LYMPHEDEMA ASSESSMENTS:   SURGERY TYPE/DATE: left lumpectomy and sentinel node biopsy (6 negative nodes) on 11/19/2020. right breast reduction and left Mastopexy on 10/27/2021  NUMBER OF LYMPH NODES REMOVED: 0+/6  CHEMOTHERAPY: YES  RADIATION:YES  HORMONE TREATMENT: NO     LYMPHEDEMA ASSESSMENTS:   LANDMARK RIGHT  eval  At axilla  39.1  15 cm proximal to the proximal aspect of the olecranon process 39  10 cm proximal to the proximal aspect of the olecranon process 38  Olecranon process 30  15 cm proximal to the proximal aspect of the ulnar styloid process 28.0  10 cm proximal to the proximal aspect of the ulnar styloid process 24.1  Just distal to the ulnar styloid process 17.5  Across hand at thumb web space 20.1  At base of 2nd digit 6.8  (Blank rows = not tested)  LANDMARK LEFT  eval  At axilla  37.3  15 cm proximal to the proximal aspect of the olecranon process 38.9   10 cm proximal to the proximal aspect of the olecranon process 37.5  Olecranon process 29.0  15 cm proximal to the proximal aspect of the ulnar styloid process 27.1  10 cm proximal to  the proximal aspect of the ulnar styloid process 22.6  Just distal to the ulnar styloid process 17.15  Across hand at thumb web space 19.7  At base of 2nd digit 6.6  (Blank rows = not tested)  Chest circumference just inferior to the axillae:  Chest circumference at the largest point:       L-DEX LYMPHEDEMA SCREENING:     QUICK DASH SURVEY: 59%                                                                                                                            TREATMENT DATE:  11/22/2023 Left shoulder abd with pulleys x 2:30 ea 3 D AROM x 5 ea, bilateral flexion, bilateral scaption, bilateral horizontal abd x 5 Cording release left UE STM bilateral UT and Left pectorals, lateral trunk with cocoa butter PROM left shoulder flex, scaption, abd, ER with VC's to relax  Left pec doorway stretch 3 x 20 sec 11/19/2024 Ball rolls  on wall x  forward and 5 left abd Left shoulder abd with pulleys x 3 min 3 D AROM x 5 ea, bilateral flexion, bilateral scaption, bilateral horizontal abd, snow angels x 5 Cording release left UE STM left UT and pectorals, lateral trunk with cocoa butter PROM left shoulder flex, scaption, abd, ER with VC's to relax Measured AROM:True abd Bilaterally about the same.   11/14/2024  Pulleys x 2 min flex and abduction Ball rolls on wall x  forward and 5 left abd Half foam roll :3 D AROM x 5 ea, alternate flexion, bilateral scaption, bilateral horizontal abd, snow angels x 5 Cording release left UE STM left UT and pectorals, lateral trunk with cocoa butter PROM left shoulder flex, scaption, abd, ER with VC's to relax  11/06/2024 Pulleys x 2 min flex and abduction Ball rolls on wall x 10 forward and 5 left abd 3 D AROM x 5 ea, alternate flexion, bilateral scaption,  bilateral horizontal abd, snow angels x 5 Star gazer x 5 Cording release left UE STM left UT and pectorals, lateral trunk with cocoa butter PROM left shoulder flex, scaption, abd, ER with VC's to relax  11/01/2024 Pt educated in exs listed below performing each x 4-5 repetitions to decrease shoulder/chest tightness and cording. TG soft sleeve cut to assist with cording/keeping her arm warm. Discussed POC, LOS, treatment intervetions,No show policy    PATIENT EDUCATION:  Education details: Access Code: [email or web address] URL: [email or web address] Date: 11/01/2024 Prepared by: Grayce Sheldon  Exercises - Supine Shoulder Flexion Extension AAROM with Dowel  - 2 x daily - 7 x weekly - 1 sets - 5 reps - 5 hold - Single Arm Doorway Pec Stretch at 90 Degrees Abduction  - 2 x daily - 7 x weekly - 1 sets - 3 reps - 20 hold Supine stargazer, standing shoulder wall slide for abduction Person educated: Patient Education method: Explanation, Demonstration, and Handouts Education comprehension: verbalized understanding and returned demonstration  HOME EXERCISE PROGRAM: - Supine Shoulder Flexion Extension AAROM with Dowel    - Single Arm Doorway Pec Stretch at 90 Degrees Abduction   Supine stargazer, standing shoulder wall slide for abduction  ASSESSMENT:  CLINICAL IMPRESSION: Good overall improvement in pain levels in left UE, and visibly improving shoulder ROM. Held ball rolls today due to new onset of neck pain.  EVAL Patient is a 39 y.o. female who was seen today for physical therapy evaluation and treatment for complaints of left UE tightness/pain/ achiness present since the end of November. She has visible and palpable cording in the axilla, with tightness felt throughout the forearm. Her left pectorals are very tight and her left shoulder ROM is restricted. She does not have any measurable edema today and her SOZO screen was in the green. She was educated in Left shoulder/pec stretches  to start for HEP. She will benefit from skilled PT to address cording and ROM deficits to help decrease the achiness/pain in her left UE.   OBJECTIVE IMPAIRMENTS: decreased activity tolerance, decreased knowledge of condition, decreased mobility, decreased ROM, increased fascial restrictions, impaired UE functional use, postural dysfunction, and pain.   ACTIVITY LIMITATIONS: carrying, lifting, and reach over head  PARTICIPATION LIMITATIONS: cleaning, occupation, and church  PERSONAL FACTORS: 3+ comorbidities: neoadjuvant chemo, L.Breast surgery with SLNB,radiation are also affecting patient's functional outcome.   REHAB POTENTIAL: Excellent  CLINICAL DECISION MAKING: Stable/uncomplicated  EVALUATION COMPLEXITY: Low  GOALS: Goals reviewed with patient? Yes  SHORT TERM GOALS=LONG TERM GOALS: Target date: 12/14/2023  Patient will demonstrate independence in HEP to decrease pain and improve ROM  Baseline: Goal status: INITIAL  2.  Patient will increase left shoulder active abduction to >/= 170 degrees for increased ease reaching  Baseline:  Goal status: INITIAL  3.  Patient will report >/= 50% decrease in pain/tightness for increased tolerance of daily tasks  Baseline:  Goal status: INITIAL  4.  Quick dash will improve to no greater than 18% to demonstrate improved function   Baseline 59%   Goal status: INITIAL PLAN:  PT FREQUENCY: 2x/week  PT DURATION: 6 weeks  PLANNED INTERVENTIONS: 97164- PT Re-evaluation, 97750- Physical Performance Testing, 97110-Therapeutic exercises, 97530- Therapeutic activity, 97112- Neuromuscular re-education, 97535- Self Care, [id number]- Manual therapy, 97760- Orthotic Initial, and [phone]- Orthotic/Prosthetic subsequent  PLAN FOR NEXT SESSION: Review HEP, Cording release left UE,  cupping prn,PROM, pec stretches, supine AROM progress to half roll etc, MLD prn  Grayce JINNY Sheldon, PT 11/21/2024, 11:05 AM  OUTPATIENT PHYSICAL THERAPY  UPPER EXTREMITY  ONCOLOGY EVALUATION  Patient Name: Jeanne Evans MRN: [id number] DOB:1986/04/24, 39 y.o., female Today's Date: 11/21/2024  END OF SESSION:  PT End of Session - 11/21/24 1000     Visit Number 5    Number of Visits 12    Date for Recertification  12/13/24    Authorization Type UHC commercial and Medicaid    Authorization Time Period [facility] 12/18-1/29/2026    Authorization - Visit Number 5    Authorization - Number of Visits 12    PT Start Time 1000    PT Stop Time 1059    PT Time Calculation (min) 59 min    Activity Tolerance Patient tolerated treatment well    Behavior During Therapy [facility] for tasks assessed/performed          Past Medical History:  Diagnosis Date   Asthma 07/31/2021   Breast cancer (HCC)    Family history of ovarian cancer    Family history of prostate cancer    GERD (gastroesophageal reflux disease)    History of asthma    has albuterol , uses PRN   History of heart murmur in childhood    History of multiple allergies    Past Surgical History:  Procedure Laterality Date   BREAST LUMPECTOMY Left 11/2020   BREAST LUMPECTOMY WITH RADIOACTIVE SEED AND SENTINEL LYMPH NODE BIOPSY Left 11/19/2020   Procedure: LEFT BREAST LUMPECTOMY X 2 WITH RADIOACTIVE SEED AND SENTINEL LYMPH NODE MAPPING;  Surgeon: Vanderbilt Ned, MD;  Location: [facility];  Service: General;  Laterality: Left;  PECTORAL BLOCK   BREAST REDUCTION SURGERY Right 10/27/2021   Procedure: MAMMARY REDUCTION  TO RIGHT BREAST;  Surgeon: Elisabeth Craig RAMAN, MD;  Location: [facility];  Service: Plastics;  Laterality: Right;   LIPOSUCTION WITH LIPOFILLING Bilateral 10/27/2021   Procedure: BILATERAL FAT GRAFTING TO BREAST;  Surgeon: Elisabeth Craig RAMAN, MD;  Location: [facility];  Service: Plastics;  Laterality: Bilateral;   PORTACATH PLACEMENT Right 06/18/2020   Procedure: INSERTION PORT-A-CATH WITH ULTRASOUND GUIDANCE;  Surgeon: Vanderbilt Ned, MD;  Location: [facility]  ;  Service: General;  Laterality: Right;   RE-EXCISION OF BREAST LUMPECTOMY Left 12/09/2020   Procedure: RE-EXCISION OF LEFT BREAST LUMPECTOMY;  Surgeon: Vanderbilt Ned, MD;  [facility];  Service: General;  Laterality: Left;   REDUCTION MAMMAPLASTY Right    Patient Active Problem List   Diagnosis Date Noted   Seasonal allergic rhinitis due to pollen 06/14/2024   Intrinsic  atopic dermatitis 06/14/2024   History of sarcoidosis 06/06/2023   Vitamin D  deficiency 06/06/2023   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   Lichen planus 01/21/2023   Gastroesophageal reflux disease without esophagitis 01/21/2023   Other allergic rhinitis 01/21/2023   Port-A-Cath in place 03/11/2022   Acute respiratory failure with hypoxemia (HCC) 02/03/2022   Acute respiratory failure with hypoxia (HCC) 02/03/2022   Not well controlled asthma without complication 07/31/2021   Lymphedema of left arm 02/02/2021   Drug-induced neutropenia 09/30/2020   Genetic testing 06/26/2020   Family history of ovarian cancer    Family history of prostate cancer    Malignant neoplasm of upper-outer quadrant of left breast in female, estrogen receptor negative (HCC) 06/03/2020      REFERRING PROVIDER: Amber Stalls, MD  REFERRING DIAG: Left arm pain/tightness  THERAPY DIAG:  Malignant neoplasm of upper-outer quadrant of left female breast, unspecified estrogen receptor status (HCC)  Aftercare following surgery for neoplasm  Stiffness of left shoulder, not elsewhere classified  Axillary web syndrome  ONSET DATE: Nov 26,2025  Rationale for Evaluation and Treatment: Rehabilitation  SUBJECTIVE:                                                                                                                                                                                           SUBJECTIVE STATEMENT:   Feeling better overall, not as  much pulling in the arm pit. My neck and shoulder (UT) has bothered me for the last 2 days.The achiness I was getting in my arm is better overall.  My pain is about 50% better overal.  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 My arm started aching to the bone. I was running a fever, and cold air made it ache. I went to UC they didn't see swelling or anything. My armpit was tight and I was doing MLD. They didn't find anything. I went to the ED the next day and I had a UTI. The achiness went away  some when I got the antibiotics and ibuprofen . I still get the achiness, but not as often. It can still be very painful though, especially when its cold. When I saw Dr. Stalls she wanted me to come see you to be checked.  PERTINENT HISTORY:  Patient was diagnosed on 05/12/2020 with left breast cancer. She underwent neoadjuvant chemotherapy 06/19/2020-09/23/2020 and had a left lumpectomy and sentinel node biopsy (6 negative nodes) on 11/19/2020. 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 Numbness in posterior arm is a little better. She had a right  breast reduction and left Mastopexy on 10/27/2021  PAIN:  Are you having pain? Yes NPRS scale: 0/10 present 5/10 at neck and UT on left Pain location: left axilla, cording Pain orientation: Left  PAIN TYPE: aching, throbbing, and tight, occasional numbness, tingle, coldness Pain description: intermittent and aching  Aggravating factors: cold weather,  Relieving factors: massage,  PRECAUTIONS: Left UE lymphedema risk, Sarcoidosis, uveitis  RED FLAGS: None   WEIGHT BEARING RESTRICTIONS: No  FALLS:  Has patient fallen in last 6 months? No  LIVING ENVIRONMENT: Lives with: lives with their family Lives in: House/apartment Stairs: No;    OCCUPATION: custodial cleaning/writing bonds  LEISURE: cook, shop  HAND DOMINANCE: right   PRIOR LEVEL OF FUNCTION: Independent  PATIENT GOALS: Decrease pain,  improve  ROM   OBJECTIVE: Note: Objective measures were completed at Evaluation unless otherwise noted.  COGNITION: Overall cognitive status: Within functional limits for tasks assessed   PALPATION: Palpable cording in axilla, and mildly in forearm  OBSERVATIONS / OTHER ASSESSMENTS: cording noted left axilla and running down into left forearm  SENSATION: Light touch: Deficits     POSTURE: Forward head, rounded shouders  UPPER EXTREMITY AROM/PROM:  A/PROM RIGHT   eval   Shoulder extension   Shoulder flexion 155  Shoulder abduction 176  Shoulder internal rotation 60  Shoulder external rotation 92    (Blank rows = not tested)  A/PROM LEFT   eval  Shoulder extension   Shoulder flexion 156  Shoulder abduction 158  Shoulder internal rotation 45  Shoulder external rotation 86    (Blank rows = not tested)  CERVICAL AROM: All within normal limits:      UPPER EXTREMITY STRENGTH:   LYMPHEDEMA ASSESSMENTS:   SURGERY TYPE/DATE: left lumpectomy and sentinel node biopsy (6 negative nodes) on 11/19/2020. right breast reduction and left Mastopexy on 10/27/2021  NUMBER OF LYMPH NODES REMOVED: 0+/6  CHEMOTHERAPY: YES  RADIATION:YES  HORMONE TREATMENT: NO     LYMPHEDEMA ASSESSMENTS:   LANDMARK RIGHT  eval  At axilla  39.1  15 cm proximal to the proximal aspect of the olecranon process 39  10 cm proximal to the proximal aspect of the olecranon process 38  Olecranon process 30  15 cm proximal to the proximal aspect of the ulnar styloid process 28.0  10 cm proximal to the proximal aspect of the ulnar styloid process 24.1  Just distal to the ulnar styloid process 17.5  Across hand at thumb web space 20.1  At base of 2nd digit 6.8  (Blank rows = not tested)  LANDMARK LEFT  eval  At axilla  37.3  15 cm proximal to the proximal aspect of the olecranon process 38.9  10 cm proximal to the proximal aspect of the olecranon process 37.5  Olecranon process 29.0  15 cm  proximal to the proximal aspect of the ulnar styloid process 27.1  10 cm proximal to  the proximal aspect of the ulnar styloid process 22.6  Just distal to the ulnar styloid process 17.15  Across hand at thumb web space 19.7  At base of 2nd digit 6.6  (Blank rows = not tested)  Chest circumference just inferior to the axillae:  Chest circumference at the largest point:       L-DEX LYMPHEDEMA SCREENING:     QUICK DASH SURVEY: 59%  TREATMENT DATE:  11/22/2023 Discussed progress Pulleys 2 min shoulder flexion and abduction    11/19/2024  Ball rolls on wall x  forward and 5 left abd Left shoulder abd with pulleys x 3 min 3 D AROM x 5 ea, bilateral flexion, bilateral scaption, bilateral horizontal abd, snow angels x 5 Cording release left UE STM left UT and pectorals, lateral trunk with cocoa butter PROM left shoulder flex, scaption, abd, ER with VC's to relax Measured AROM:True abd Bilaterally about the same.  11/14/2024 Pulleys x 2 min flex and abduction Ball rolls on wall x 5 forward and 5 left abd Supine on half foam roll;alternate shoulder flexion x 5,bilateral scaption, horizontal abd, snow angels x 5 Cording release left UE STM left UT and pectorals, lateral trunk with cocoa butter PROM left shoulder flex, scaption, abd, ER with VC's to relax  11/06/2024 Pulleys x 2 min flex and abduction Ball rolls on wall x 5 forward and 5 left abd 3 D AROM x 5 ea, alternate flexion, bilateral scaption, bilateral horizontal abd, snow angels x 5 Star gazer x 5 Cording release left UE STM left UT and pectorals, lateral trunk with cocoa butter PROM left shoulder flex, scaption, abd, ER with VC's to relax  11/01/2024 Pt educated in exs listed below performing each x 4-5 repetitions to decrease shoulder/chest tightness and cording. TG soft sleeve cut to assist  with cording/keeping her arm warm. Discussed POC, LOS, treatment intervetions,No show policy    PATIENT EDUCATION:  Education details: Access Code: [email or web address] URL: [email or web address] Date: 11/01/2024 Prepared by: Grayce Sheldon  Exercises - Supine Shoulder Flexion Extension AAROM with Dowel  - 2 x daily - 7 x weekly - 1 sets - 5 reps - 5 hold - Single Arm Doorway Pec Stretch at 90 Degrees Abduction  - 2 x daily - 7 x weekly - 1 sets - 3 reps - 20 hold Supine stargazer, standing shoulder wall slide for abduction Person educated: Patient Education method: Explanation, Demonstration, and Handouts Education comprehension: verbalized understanding and returned demonstration  HOME EXERCISE PROGRAM: - Supine Shoulder Flexion Extension AAROM with Dowel    - Single Arm Doorway Pec Stretch at 90 Degrees Abduction   Supine stargazer, standing shoulder wall slide for abduction  ASSESSMENT:  CLINICAL IMPRESSION: TG soft has been a big help with achiness in UE. Very tight in pectorals; clavicular and axillary border noted with STM today.  ROM visibly improved by end of session.   EVAL Patient is a 39 y.o. female who was seen today for physical therapy evaluation and treatment for complaints of left UE tightness/pain/ achiness present since the end of November. She has visible and palpable cording in the axilla, with tightness felt throughout the forearm. Her left pectorals are very tight and her left shoulder ROM is restricted. She does not have any measurable edema today and her SOZO screen was in the green. She was educated in Left shoulder/pec stretches to start for HEP. She will benefit from skilled PT to address cording and ROM deficits to help decrease the achiness/pain in her left UE.   OBJECTIVE IMPAIRMENTS: decreased activity tolerance, decreased knowledge of condition, decreased mobility, decreased ROM, increased fascial restrictions, impaired UE functional use, postural  dysfunction, and pain.   ACTIVITY LIMITATIONS: carrying, lifting, and reach over head  PARTICIPATION LIMITATIONS: cleaning, occupation, and church  PERSONAL FACTORS: 3+ comorbidities: neoadjuvant chemo, L.Breast surgery with SLNB,radiation are also affecting patient's functional outcome.   REHAB POTENTIAL: Excellent  CLINICAL DECISION MAKING: Stable/uncomplicated  EVALUATION COMPLEXITY: Low  GOALS: Goals reviewed with patient? Yes  SHORT TERM GOALS=LONG TERM GOALS: Target date: 12/14/2023  Patient will demonstrate independence in HEP to decrease pain and improve ROM  Baseline: Goal status: MET 11/21/2024  2.  Patient will increase left shoulder active abduction to >/= 170 degrees for increased ease reaching  Baseline:  Goal status: INITIAL  3.  Patient will report >/= 50% decrease in pain/tightness for increased tolerance of daily tasks  Baseline:  Goal status: INITIAL  4.  Quick dash will improve to no greater than 18% to demonstrate improved function   Baseline 59%   Goal status: INITIAL PLAN:  PT FREQUENCY: 2x/week  PT DURATION: 6 weeks  PLANNED INTERVENTIONS: 97164- PT Re-evaluation, 97750- Physical Performance Testing, 97110-Therapeutic exercises, 97530- Therapeutic activity, [id number]- Neuromuscular re-education, 97535- Self Care, [id number]- Manual therapy, 97760- Orthotic Initial, and [id number]- Orthotic/Prosthetic subsequent  PLAN FOR NEXT SESSION: Review HEP, Cording release left UE,  cupping prn,PROM, pec stretches, supine AROM progress to half roll etc, MLD prn  Grayce JINNY Sheldon, PT 11/21/2024, 11:05 AM  "

## 2024-11-27 ENCOUNTER — Ambulatory Visit

## 2024-11-27 DIAGNOSIS — C50412 Malignant neoplasm of upper-outer quadrant of left female breast: Secondary | ICD-10-CM | POA: Diagnosis not present

## 2024-11-27 DIAGNOSIS — M25612 Stiffness of left shoulder, not elsewhere classified: Secondary | ICD-10-CM

## 2024-11-27 DIAGNOSIS — L905 Scar conditions and fibrosis of skin: Secondary | ICD-10-CM

## 2024-11-27 DIAGNOSIS — Z483 Aftercare following surgery for neoplasm: Secondary | ICD-10-CM

## 2024-11-27 NOTE — Therapy (Signed)
 " OUTPATIENT PHYSICAL THERAPY  UPPER EXTREMITY ONCOLOGY EVALUATION  Patient Name: Jeanne Evans MRN: [id number] DOB:[birth year], 39 y.o., female Today's Date: 11/27/2024  END OF SESSION:  PT End of Session - 11/27/24 0903     Visit Number 6    Number of Visits 12    Date for Recertification  12/13/24    Authorization Type UHC commercial and Medicaid    Authorization Time Period [facility] 12/18-1/29/2026    Authorization - Visit Number 6    Authorization - Number of Visits 12    PT Start Time 0904    PT Stop Time 0953    PT Time Calculation (min) 49 min    Activity Tolerance Patient tolerated treatment well    Behavior During Therapy [facility] for tasks assessed/performed          Past Medical History:  Diagnosis Date   Asthma 07/31/2021   Breast cancer (HCC)    Family history of ovarian cancer    Family history of prostate cancer    GERD (gastroesophageal reflux disease)    History of asthma    has albuterol , uses PRN   History of heart murmur in childhood    History of multiple allergies    Past Surgical History:  Procedure Laterality Date   BREAST LUMPECTOMY Left 11/2020   BREAST LUMPECTOMY WITH RADIOACTIVE SEED AND SENTINEL LYMPH NODE BIOPSY Left 11/19/2020   Procedure: LEFT BREAST LUMPECTOMY X 2 WITH RADIOACTIVE SEED AND SENTINEL LYMPH NODE MAPPING;  Surgeon: Vanderbilt Ned, MD;  Location: [facility];  Service: General;  Laterality: Left;  PECTORAL BLOCK   BREAST REDUCTION SURGERY Right 10/27/2021   Procedure: MAMMARY REDUCTION  TO RIGHT BREAST;  Surgeon: Elisabeth Craig RAMAN, MD;  Location: [facility];  Service: Plastics;  Laterality: Right;   LIPOSUCTION WITH LIPOFILLING Bilateral 10/27/2021   Procedure: BILATERAL FAT GRAFTING TO BREAST;  Surgeon: Elisabeth Craig RAMAN, MD;  Location: [facility];  Service: Plastics;  Laterality: Bilateral;   PORTACATH PLACEMENT Right 06/18/2020   Procedure: INSERTION PORT-A-CATH WITH ULTRASOUND GUIDANCE;   Surgeon: Vanderbilt Ned, MD;  Location: [facility];  Service: General;  Laterality: Right;   RE-EXCISION OF BREAST LUMPECTOMY Left 12/09/2020   Procedure: RE-EXCISION OF LEFT BREAST LUMPECTOMY;  Surgeon: Vanderbilt Ned, MD;  [facility];  Service: General;  Laterality: Left;   REDUCTION MAMMAPLASTY Right    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   Lichen planus 01/21/2023   Gastroesophageal reflux disease without esophagitis 01/21/2023   Other allergic rhinitis 01/21/2023   Port-A-Cath in place 03/11/2022   Acute respiratory failure with hypoxemia (HCC) 02/03/2022   Acute respiratory failure with hypoxia (HCC) 02/03/2022   Not well controlled asthma without complication 07/31/2021   Lymphedema of left arm 02/02/2021   Drug-induced neutropenia 09/30/2020   Genetic testing 06/26/2020   Family history of ovarian cancer    Family history of prostate cancer    Malignant neoplasm of upper-outer quadrant of left breast in female, estrogen receptor negative (HCC) 06/03/2020      REFERRING PROVIDER: Amber Stalls, MD  REFERRING DIAG: Left arm pain/tightness  THERAPY DIAG:  Malignant neoplasm of upper-outer quadrant of left female breast, unspecified estrogen receptor status (HCC)  Aftercare following surgery for neoplasm  Stiffness of left shoulder, not elsewhere classified  Axillary web syndrome  ONSET DATE: Nov 26,2025  Rationale for Evaluation and Treatment: Rehabilitation  SUBJECTIVE:                                                                                                                                                                                            SUBJECTIVE STATEMENT:   My arm is doing better. It didn't hurt this past weekend, but I was so busy I wouldn't have noticed. A little sore and tight in the left axilla today. The TG soft helps with the achiness. I haven't had too much.  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 My arm started aching to the bone. I was running a fever, and cold air made it ache. I went to UC they didn't see swelling or anything. My armpit was tight and I was doing MLD. They didn't find anything. I went to the ED the next day and I had a UTI. The achiness went away  some when I got the antibiotics and ibuprofen . I still get the achiness, but not as often. It can still be very painful though, especially when its cold. When I saw Dr. Loretha she wanted me to come see you to be checked.  PERTINENT HISTORY:  Patient was diagnosed on 05/12/2020 with left breast cancer. She underwent neoadjuvant chemotherapy 06/19/2020-09/23/2020 and had a left lumpectomy and sentinel node biopsy (6 negative nodes) on 11/19/2020. 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 Numbness in posterior arm is a little better. She had a right breast reduction and left Mastopexy on 10/27/2021  PAIN:  Are you having pain? Yes NPRS scale: 0/10 present-8/10 at worst Pain location: entire left UE/axilla, cording Pain orientation: Left  PAIN TYPE: aching, throbbing, and tight, occasional numbness, tingle Pain description: intermittent and aching  Aggravating factors: cold weather,  Relieving factors: massage,  PRECAUTIONS: Left UE lymphedema risk, Sarcoidosis, uveitis  RED FLAGS: None   WEIGHT BEARING RESTRICTIONS: No  FALLS:  Has patient fallen in last 6 months? No  LIVING ENVIRONMENT: Lives with: lives with their family Lives in: House/apartment Stairs: No;    OCCUPATION: custodial cleaning/writing bonds  LEISURE: cook, shop  HAND DOMINANCE: right   PRIOR LEVEL OF FUNCTION:  Independent  PATIENT GOALS: Decrease pain,  improve ROM   OBJECTIVE: Note: Objective measures were completed at Evaluation unless otherwise noted.  COGNITION: Overall cognitive status: Within functional limits for tasks assessed   PALPATION: Palpable cording in axilla, and mildly in forearm  OBSERVATIONS /  OTHER ASSESSMENTS: cording noted left axilla and running down into left forearm  SENSATION: Light touch: Deficits     POSTURE: Forward head, rounded shouders  UPPER EXTREMITY AROM/PROM:  A/PROM RIGHT   eval  Right 11/19/2024  Shoulder extension    Shoulder flexion 155 157  Shoulder abduction 176 157  Shoulder internal rotation 60   Shoulder external rotation 92     (Blank rows = not tested)  A/PROM LEFT   eval LEFT 11/19/2024  Shoulder extension    Shoulder flexion 156 160  Shoulder abduction 158 158  Shoulder internal rotation 45   Shoulder external rotation 86     (Blank rows = not tested)  CERVICAL AROM: All within normal limits:      UPPER EXTREMITY STRENGTH:   LYMPHEDEMA ASSESSMENTS:   SURGERY TYPE/DATE: left lumpectomy and sentinel node biopsy (6 negative nodes) on 11/19/2020. right breast reduction and left Mastopexy on 10/27/2021  NUMBER OF LYMPH NODES REMOVED: 0+/6  CHEMOTHERAPY: YES  RADIATION:YES  HORMONE TREATMENT: NO     LYMPHEDEMA ASSESSMENTS:   LANDMARK RIGHT  eval  At axilla  39.1  15 cm proximal to the proximal aspect of the olecranon process 39  10 cm proximal to the proximal aspect of the olecranon process 38  Olecranon process 30  15 cm proximal to the proximal aspect of the ulnar styloid process 28.0  10 cm proximal to the proximal aspect of the ulnar styloid process 24.1  Just distal to the ulnar styloid process 17.5  Across hand at thumb web space 20.1  At base of 2nd digit 6.8  (Blank rows = not tested)  LANDMARK LEFT  eval  At axilla  37.3  15 cm proximal to the proximal aspect of the olecranon process 38.9   10 cm proximal to the proximal aspect of the olecranon process 37.5  Olecranon process 29.0  15 cm proximal to the proximal aspect of the ulnar styloid process 27.1  10 cm proximal to  the proximal aspect of the ulnar styloid process 22.6  Just distal to the ulnar styloid process 17.15  Across hand at thumb web space 19.7  At base of 2nd digit 6.6  (Blank rows = not tested)  Chest circumference just inferior to the axillae:  Chest circumference at the largest point:       L-DEX LYMPHEDEMA SCREENING:     QUICK DASH SURVEY: 59%                                                                                                                            TREATMENT DATE:  11/28/2023 Ball rolls on wall x 5 flex and abd Scap retraction and shoulder ext with yellow band x 10 Jobes flex and scaption x 10 Cording release left UE, cord release with wrist ext in several positions STM bilateral UT and Left pectorals, lateral trunk with cocoa butter PROM left shoulder flex, scaption, abd, ER with VC's to relax  11/22/2023  Left shoulder abd with pulleys x 2:30 ea 3 D AROM x 5 ea, bilateral flexion, bilateral scaption, bilateral horizontal abd x 5 Cording release left UE STM bilateral UT and Left pectorals, lateral trunk with cocoa butter PROM left shoulder flex, scaption, abd, ER with VC's to relax  11/19/2024 Ball rolls on wall x  forward and 5 left abd Left shoulder abd with pulleys x 3 min 3 D AROM x 5 ea, bilateral flexion, bilateral scaption, bilateral horizontal abd, snow angels x 5 Cording release left UE STM left UT and pectorals, lateral trunk with cocoa butter PROM left shoulder flex, scaption, abd, ER with VC's to relax Measured AROM:True abd Bilaterally about the same.   11/14/2024  Pulleys x 2 min flex and abduction Ball rolls on wall x  forward and 5 left abd Half foam roll :3 D AROM x 5 ea, alternate flexion, bilateral scaption, bilateral horizontal abd, snow angels x  5 Cording release left UE STM left UT and pectorals, lateral trunk with cocoa butter PROM left shoulder flex, scaption, abd, ER with VC's to relax  11/06/2024 Pulleys x 2 min flex and abduction Ball rolls on wall x 10 forward and 5 left abd 3 D AROM x 5 ea, alternate flexion, bilateral scaption, bilateral horizontal abd, snow angels x 5 Star gazer x 5 Cording release left UE STM left UT and pectorals, lateral trunk with cocoa butter PROM left shoulder flex, scaption, abd, ER with VC's to relax  11/01/2024 Pt educated in exs listed below performing each x 4-5 repetitions to decrease shoulder/chest tightness and cording. TG soft sleeve cut to assist with cording/keeping her arm warm. Discussed POC, LOS, treatment intervetions,No show policy    PATIENT EDUCATION:  Education details: Access Code: [email or web address] URL: [email or web address] Date: 11/01/2024 Prepared by: Grayce Sheldon  Exercises - Supine Shoulder Flexion Extension AAROM with Dowel  - 2 x daily - 7 x weekly - 1 sets - 5 reps - 5 hold - Single Arm Doorway Pec Stretch at 90 Degrees Abduction  - 2 x daily - 7 x weekly - 1 sets - 3 reps - 20 hold Supine stargazer, standing shoulder wall slide for abduction Person educated: Patient Education method: Explanation, Demonstration, and Handouts Education comprehension: verbalized understanding and returned demonstration  HOME EXERCISE PROGRAM: - Supine Shoulder Flexion Extension AAROM with Dowel    - Single Arm Doorway Pec Stretch at 90 Degrees Abduction   Supine stargazer, standing shoulder wall slide for abduction  ASSESSMENT:  CLINICAL IMPRESSION: Neck pain improved after last visit but pt still very tight in UT's. Cording tight today but improved after therapy. Pt will continue to benefit from skilled PT. Overall pain improved, but some tingling still exists.  EVAL Patient is a 39 y.o. female who was seen today for physical therapy evaluation and treatment for  complaints of left UE tightness/pain/ achiness present since the end of November. She has visible and palpable cording in the axilla, with tightness felt throughout the forearm. Her left pectorals are very tight and her left shoulder ROM is restricted. She does not have any measurable edema today and her SOZO screen was in the green. She was educated in Left shoulder/pec stretches to start for HEP. She will benefit from skilled PT to address cording and ROM deficits to help decrease the achiness/pain in her left UE.   OBJECTIVE IMPAIRMENTS: decreased activity tolerance, decreased knowledge of condition, decreased mobility, decreased ROM, increased fascial restrictions, impaired UE functional use, postural dysfunction, and pain.  ACTIVITY LIMITATIONS: carrying, lifting, and reach over head  PARTICIPATION LIMITATIONS: cleaning, occupation, and church  PERSONAL FACTORS: 3+ comorbidities: neoadjuvant chemo, L.Breast surgery with SLNB,radiation are also affecting patient's functional outcome.   REHAB POTENTIAL: Excellent  CLINICAL DECISION MAKING: Stable/uncomplicated  EVALUATION COMPLEXITY: Low  GOALS: Goals reviewed with patient? Yes  SHORT TERM GOALS=LONG TERM GOALS: Target date: 12/14/2023  Patient will demonstrate independence in HEP to decrease pain and improve ROM  Baseline: Goal status: INITIAL  2.  Patient will increase left shoulder active abduction to >/= 170 degrees for increased ease reaching  Baseline:  Goal status: INITIAL  3.  Patient will report >/= 50% decrease in pain/tightness for increased tolerance of daily tasks  Baseline:  Goal status: INITIAL  4.  Quick dash will improve to no greater than 18% to demonstrate improved function   Baseline 59%   Goal status: INITIAL PLAN:  PT FREQUENCY: 2x/week  PT DURATION: 6 weeks  PLANNED INTERVENTIONS: 97164- PT Re-evaluation, 97750- Physical Performance Testing, 97110-Therapeutic exercises, 97530- Therapeutic activity,  97112- Neuromuscular re-education, 97535- Self Care, 02859- Manual therapy, 97760- Orthotic Initial, and [phone]- Orthotic/Prosthetic subsequent  PLAN FOR NEXT SESSION: Review HEP, Cording release left UE,  cupping prn,PROM, pec stretches, supine AROM progress to half roll etc, MLD prn  Grayce JINNY Sheldon, PT 11/27/2024, 9:56 AM  OUTPATIENT PHYSICAL THERAPY  UPPER EXTREMITY ONCOLOGY TREATMENT  Patient Name: Jeanne Evans MRN: [id number] DOB:Jul 21, 1986, 39 y.o., female Today's Date: 11/27/2024  END OF SESSION:  PT End of Session - 11/27/24 0903     Visit Number 6    Number of Visits 12    Date for Recertification  12/13/24    Authorization Type UHC commercial and Medicaid    Authorization Time Period [facility] 12/18-1/29/2026    Authorization - Visit Number 6    Authorization - Number of Visits 12    PT Start Time 0904    PT Stop Time 0953    PT Time Calculation (min) 49 min    Activity Tolerance Patient tolerated treatment well    Behavior During Therapy [facility] for tasks assessed/performed          Past Medical History:  Diagnosis Date   Asthma 07/31/2021   Breast cancer (HCC)    Family history of ovarian cancer    Family history of prostate cancer    GERD (gastroesophageal reflux disease)    History of asthma    has albuterol , uses PRN   History of heart murmur in childhood    History of multiple allergies    Past Surgical History:  Procedure Laterality Date   BREAST LUMPECTOMY Left 11/2020   BREAST LUMPECTOMY WITH RADIOACTIVE SEED AND SENTINEL LYMPH NODE BIOPSY Left 11/19/2020   Procedure: LEFT BREAST LUMPECTOMY X 2 WITH RADIOACTIVE SEED AND SENTINEL LYMPH NODE MAPPING;  Surgeon: Vanderbilt Ned, MD;  Location: [facility];  Service: General;  Laterality: Left;  PECTORAL BLOCK   BREAST REDUCTION SURGERY Right 10/27/2021   Procedure: MAMMARY REDUCTION  TO RIGHT BREAST;  Surgeon: Elisabeth Craig RAMAN, MD;  Location: [facility];  Service: Plastics;   Laterality: Right;   LIPOSUCTION WITH LIPOFILLING Bilateral 10/27/2021   Procedure: BILATERAL FAT GRAFTING TO BREAST;  Surgeon: Elisabeth Craig RAMAN, MD;  Location: [facility];  Service: Plastics;  Laterality: Bilateral;   PORTACATH PLACEMENT Right 06/18/2020   Procedure: INSERTION PORT-A-CATH WITH ULTRASOUND GUIDANCE;  Surgeon: Vanderbilt Ned, MD;  Location: [facility];  Service: General;  Laterality:  Right;   RE-EXCISION OF BREAST LUMPECTOMY Left 12/09/2020   Procedure: RE-EXCISION OF LEFT BREAST LUMPECTOMY;  Surgeon: Vanderbilt Ned, MD;  Location: [facility];  Service: General;  Laterality: Left;   REDUCTION MAMMAPLASTY Right    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   Lichen planus 01/21/2023   Gastroesophageal reflux disease without esophagitis 01/21/2023   Other allergic rhinitis 01/21/2023   Port-A-Cath in place 03/11/2022   Acute respiratory failure with hypoxemia (HCC) 02/03/2022   Acute respiratory failure with hypoxia (HCC) 02/03/2022   Not well controlled asthma without complication 07/31/2021   Lymphedema of left arm 02/02/2021   Drug-induced neutropenia 09/30/2020   Genetic testing 06/26/2020   Family history of ovarian cancer    Family history of prostate cancer    Malignant neoplasm of upper-outer quadrant of left breast in female, estrogen receptor negative (HCC) 06/03/2020      REFERRING PROVIDER: Amber Stalls, MD  REFERRING DIAG: Left arm pain/tightness  THERAPY DIAG:  Malignant neoplasm of upper-outer quadrant of left female breast, unspecified estrogen receptor status (HCC)  Aftercare following surgery for neoplasm  Stiffness of left shoulder, not  elsewhere classified  Axillary web syndrome  ONSET DATE: Nov 26,2025  Rationale for Evaluation and Treatment: Rehabilitation  SUBJECTIVE:                                                                                                                                                                                           SUBJECTIVE STATEMENT:  I feel a little tight in the axilla, but no pain, but tingling is there, I think because its cold out. My neck is better but my UT still hurts. The manual work last time really helped.  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 My arm started aching to the bone. I was running a fever, and cold air made it ache. I went to UC they didn't see swelling or anything. My armpit was tight and I was doing MLD. They didn't find anything. I went to the ED the next day and I had a UTI. The achiness went away  some when I got the antibiotics and ibuprofen . I still get the achiness, but not as often. It can still be very painful though, especially when its cold. When I saw Dr. Stalls she wanted me to come see you  to be checked.  PERTINENT HISTORY:  Patient was diagnosed on 05/12/2020 with left breast cancer. She underwent neoadjuvant chemotherapy 06/19/2020-09/23/2020 and had a left lumpectomy and sentinel node biopsy (6 negative nodes) on 11/19/2020. 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 Numbness in posterior arm is a little better. She had a right breast reduction and left Mastopexy on 10/27/2021  PAIN:  Are you having pain? Yes NPRS scale: 0/10 present 5/10 at neck and UT on left Pain location: left axilla, cording Pain orientation: Left  PAIN TYPE: aching, throbbing, and tight, occasional numbness, tingle, coldness Pain description: intermittent and aching  Aggravating factors: cold weather,  Relieving factors: massage,  PRECAUTIONS: Left UE lymphedema risk, Sarcoidosis, uveitis  RED FLAGS: None   WEIGHT BEARING  RESTRICTIONS: No  FALLS:  Has patient fallen in last 6 months? No  LIVING ENVIRONMENT: Lives with: lives with their family Lives in: House/apartment Stairs: No;    OCCUPATION: custodial cleaning/writing bonds  LEISURE: cook, shop  HAND DOMINANCE: right   PRIOR LEVEL OF FUNCTION: Independent  PATIENT GOALS: Decrease pain,  improve ROM   OBJECTIVE: Note: Objective measures were completed at Evaluation unless otherwise noted.  COGNITION: Overall cognitive status: Within functional limits for tasks assessed   PALPATION: Palpable cording in axilla, and mildly in forearm  OBSERVATIONS / OTHER ASSESSMENTS: cording noted left axilla and running down into left forearm  SENSATION: Light touch: Deficits     POSTURE: Forward head, rounded shouders  UPPER EXTREMITY AROM/PROM:  A/PROM RIGHT   eval   Shoulder extension   Shoulder flexion 155  Shoulder abduction 176  Shoulder internal rotation 60  Shoulder external rotation 92    (Blank rows = not tested)  A/PROM LEFT   eval  Shoulder extension   Shoulder flexion 156  Shoulder abduction 158  Shoulder internal rotation 45  Shoulder external rotation 86    (Blank rows = not tested)  CERVICAL AROM: All within normal limits:      UPPER EXTREMITY STRENGTH:   LYMPHEDEMA ASSESSMENTS:   SURGERY TYPE/DATE: left lumpectomy and sentinel node biopsy (6 negative nodes) on 11/19/2020. right breast reduction and left Mastopexy on 10/27/2021  NUMBER OF LYMPH NODES REMOVED: 0+/6  CHEMOTHERAPY: YES  RADIATION:YES  HORMONE TREATMENT: NO     LYMPHEDEMA ASSESSMENTS:   LANDMARK RIGHT  eval  At axilla  39.1  15 cm proximal to the proximal aspect of the olecranon process 39  10 cm proximal to the proximal aspect of the olecranon process 38  Olecranon process 30  15 cm proximal to the proximal aspect of the ulnar styloid process 28.0  10 cm proximal to the proximal aspect of the ulnar styloid process 24.1  Just  distal to the ulnar styloid process 17.5  Across hand at thumb web space 20.1  At base of 2nd digit 6.8  (Blank rows = not tested)  LANDMARK LEFT  eval  At axilla  37.3  15 cm proximal to the proximal aspect of the olecranon process 38.9  10 cm proximal to the proximal aspect of the olecranon process 37.5  Olecranon process 29.0  15 cm proximal to the proximal aspect of the ulnar styloid process 27.1  10 cm proximal to  the proximal aspect of the ulnar styloid process 22.6  Just distal to the ulnar styloid process 17.15  Across hand at thumb web space 19.7  At base of 2nd digit 6.6  (Blank rows = not tested)  Chest circumference just inferior to  the axillae:  Chest circumference at the largest point:       L-DEX LYMPHEDEMA SCREENING:     QUICK DASH SURVEY: 59%                                                                                                                            TREATMENT DATE:  11/28/2023 Ball rolls on wall flexion x 5 and abd x 5 Standing scapular retraction and extension yellow band x 10 Supine horizontal abd x 10 Standing jobes flexion and scaption x 10 ea Cording release left UE STM bilateral UT and Left pectorals, lateral trunk with cocoa butter PROM left shoulder flex, scaption, abd, ER with VC's to relax  11/22/2023 Discussed progress Pulleys 2 min shoulder flexion and abduction Left shoulder abd with pulleys x 2:30 ea 3 D AROM x 5 ea, bilateral flexion, bilateral scaption, bilateral horizontal abd x 5 Cording release left UE STM bilateral UT and Left pectorals, lateral trunk with cocoa butter PROM left shoulder flex, scaption, abd, ER with VC's to relax  Left pec doorway stretch 3 x 20 sec   11/19/2024  Ball rolls on wall x  forward and 5 left abd Left shoulder abd with pulleys x 3 min 3 D AROM x 5 ea, bilateral flexion, bilateral scaption, bilateral horizontal abd, snow angels x 5 Cording release left UE STM left UT and pectorals,  lateral trunk with cocoa butter PROM left shoulder flex, scaption, abd, ER with VC's to relax Measured AROM:True abd Bilaterally about the same.  11/14/2024 Pulleys x 2 min flex and abduction Ball rolls on wall x 5 forward and 5 left abd Supine on half foam roll;alternate shoulder flexion x 5,bilateral scaption, horizontal abd, snow angels x 5 Cording release left UE STM left UT and pectorals, lateral trunk with cocoa butter PROM left shoulder flex, scaption, abd, ER with VC's to relax  11/06/2024 Pulleys x 2 min flex and abduction Ball rolls on wall x 5 forward and 5 left abd 3 D AROM x 5 ea, alternate flexion, bilateral scaption, bilateral horizontal abd, snow angels x 5 Star gazer x 5 Cording release left UE STM left UT and pectorals, lateral trunk with cocoa butter PROM left shoulder flex, scaption, abd, ER with VC's to relax  11/01/2024 Pt educated in exs listed below performing each x 4-5 repetitions to decrease shoulder/chest tightness and cording. TG soft sleeve cut to assist with cording/keeping her arm warm. Discussed POC, LOS, treatment intervetions,No show policy    PATIENT EDUCATION:  Education details: Access Code: [email or web address] URL: [email or web address] Date: 11/01/2024 Prepared by: Grayce Sheldon  Exercises - Supine Shoulder Flexion Extension AAROM with Dowel  - 2 x daily - 7 x weekly - 1 sets - 5 reps - 5 hold - Single Arm Doorway Pec Stretch at 90 Degrees Abduction  - 2 x daily - 7 x weekly - 1 sets - 3 reps - 20 hold Supine stargazer, standing shoulder wall slide for  abduction Person educated: Patient Education method: Explanation, Demonstration, and Handouts Education comprehension: verbalized understanding and returned demonstration  HOME EXERCISE PROGRAM: - Supine Shoulder Flexion Extension AAROM with Dowel    - Single Arm Doorway Pec Stretch at 90 Degrees Abduction   Supine stargazer, standing shoulder wall slide for  abduction  ASSESSMENT:  CLINICAL IMPRESSION: Left UE noticeably more fatigued with jobes flexion and scaption.   EVAL Patient is a 39 y.o. female who was seen today for physical therapy evaluation and treatment for complaints of left UE tightness/pain/ achiness present since the end of November. She has visible and palpable cording in the axilla, with tightness felt throughout the forearm. Her left pectorals are very tight and her left shoulder ROM is restricted. She does not have any measurable edema today and her SOZO screen was in the green. She was educated in Left shoulder/pec stretches to start for HEP. She will benefit from skilled PT to address cording and ROM deficits to help decrease the achiness/pain in her left UE.   OBJECTIVE IMPAIRMENTS: decreased activity tolerance, decreased knowledge of condition, decreased mobility, decreased ROM, increased fascial restrictions, impaired UE functional use, postural dysfunction, and pain.   ACTIVITY LIMITATIONS: carrying, lifting, and reach over head  PARTICIPATION LIMITATIONS: cleaning, occupation, and church  PERSONAL FACTORS: 3+ comorbidities: neoadjuvant chemo, L.Breast surgery with SLNB,radiation are also affecting patient's functional outcome.   REHAB POTENTIAL: Excellent  CLINICAL DECISION MAKING: Stable/uncomplicated  EVALUATION COMPLEXITY: Low  GOALS: Goals reviewed with patient? Yes  SHORT TERM GOALS=LONG TERM GOALS: Target date: 12/14/2023  Patient will demonstrate independence in HEP to decrease pain and improve ROM  Baseline: Goal status: MET 11/21/2024  2.  Patient will increase left shoulder active abduction to >/= 170 degrees for increased ease reaching  Baseline:  Goal status: INITIAL  3.  Patient will report >/= 50% decrease in pain/tightness for increased tolerance of daily tasks  Baseline:  Goal status: INITIAL  4.  Quick dash will improve to no greater than 18% to demonstrate improved function   Baseline  59%   Goal status: INITIAL PLAN:  PT FREQUENCY: 2x/week  PT DURATION: 6 weeks  PLANNED INTERVENTIONS: 97164- PT Re-evaluation, 97750- Physical Performance Testing, 97110-Therapeutic exercises, 97530- Therapeutic activity, [id number]- Neuromuscular re-education, 97535- Self Care, [id number]- Manual therapy, 97760- Orthotic Initial, and [id number]- Orthotic/Prosthetic subsequent  PLAN FOR NEXT SESSION: Review HEP, Cording release left UE,  cupping prn,PROM, pec stretches, supine AROM progress to half roll etc, MLD prn  Grayce JINNY Sheldon, PT 11/27/2024, 9:56 AM  "

## 2024-11-28 ENCOUNTER — Ambulatory Visit

## 2024-11-28 DIAGNOSIS — J4550 Severe persistent asthma, uncomplicated: Secondary | ICD-10-CM

## 2024-11-29 ENCOUNTER — Ambulatory Visit

## 2024-11-29 DIAGNOSIS — C50412 Malignant neoplasm of upper-outer quadrant of left female breast: Secondary | ICD-10-CM | POA: Diagnosis not present

## 2024-11-29 DIAGNOSIS — M25612 Stiffness of left shoulder, not elsewhere classified: Secondary | ICD-10-CM

## 2024-11-29 DIAGNOSIS — L905 Scar conditions and fibrosis of skin: Secondary | ICD-10-CM

## 2024-11-29 DIAGNOSIS — Z483 Aftercare following surgery for neoplasm: Secondary | ICD-10-CM

## 2024-11-29 NOTE — Therapy (Addendum)
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 TG soft sleeve cut to assist with cording/keeping her arm warm. Discussed POC, LOS, treatment intervetions,No show policy    PATIENT EDUCATION:  Education details: Access Code: [email or web address] URL: [email or web address] Date: 11/01/2024 Prepared by: Grayce Sheldon  Exercises - Supine Shoulder  Flexion Extension AAROM with Dowel  - 2 x daily - 7 x weekly - 1 sets - 5 reps - 5 hold - Single Arm Doorway Pec Stretch at 90 Degrees Abduction  - 2 x daily - 7 x weekly - 1 sets - 3 reps - 20 hold Supine stargazer, standing shoulder wall slide for abduction Person educated: Patient Education method:  Explanation, Demonstration, and Handouts Education comprehension: verbalized understanding and returned demonstration  HOME EXERCISE PROGRAM: - Supine Shoulder Flexion Extension AAROM with Dowel    - Single Arm Doorway Pec Stretch at 90 Degrees Abduction   Supine stargazer, standing shoulder wall slide for abduction  ASSESSMENT:  CLINICAL IMPRESSION: Left UE noticeably more fatigued with jobes flexion and scaption.   EVAL Patient is a 39 y.o. female who was seen today for physical therapy evaluation and treatment for complaints of left UE tightness/pain/ achiness present since the end of November. She has visible and palpable cording in the axilla, with tightness felt throughout the forearm. Her left pectorals are very tight and her left shoulder ROM is restricted. She does not have any measurable edema today and her SOZO screen was in the green. She was educated in Left shoulder/pec stretches to start for HEP. She will benefit from skilled PT to address cording and ROM deficits to help decrease the achiness/pain in her left UE.   OBJECTIVE IMPAIRMENTS: decreased activity tolerance, decreased knowledge of condition, decreased mobility, decreased ROM, increased fascial restrictions, impaired UE functional use, postural dysfunction, and pain.   ACTIVITY LIMITATIONS: carrying, lifting, and reach over head  PARTICIPATION LIMITATIONS: cleaning, occupation, and church  PERSONAL FACTORS: 3+ comorbidities: neoadjuvant chemo, L.Breast surgery with SLNB,radiation are also affecting patient's functional outcome.   REHAB POTENTIAL: Excellent  CLINICAL DECISION MAKING: Stable/uncomplicated  EVALUATION COMPLEXITY: Low  GOALS: Goals reviewed with patient? Yes  SHORT TERM GOALS=LONG TERM GOALS: Target date: 12/14/2023  Patient will demonstrate independence in HEP to decrease pain and improve ROM  Baseline: Goal status: MET 11/21/2024  2.  Patient will increase left shoulder active abduction to  >/= 170 degrees for increased ease reaching  Baseline:  Goal status: INITIAL  3.  Patient will report >/= 50% decrease in pain/tightness for increased tolerance of daily tasks  Baseline:  Goal status: INITIAL  4.  Quick dash will improve to no greater than 18% to demonstrate improved function   Baseline 59%   Goal status: INITIAL PLAN:  PT FREQUENCY: 2x/week  PT DURATION: 6 weeks  PLANNED INTERVENTIONS: 97164- PT Re-evaluation, 97750- Physical Performance Testing, 97110-Therapeutic exercises, 97530- Therapeutic activity, [id number]- Neuromuscular re-education, 97535- Self Care, [id number]- Manual therapy, 97760- Orthotic Initial, and [id number]- Orthotic/Prosthetic subsequent  PLAN FOR NEXT SESSION: Review HEP, Cording release left UE,  cupping prn,PROM, pec stretches, supine AROM progress to half roll etc, MLD prn  Grayce JINNY Sheldon, PT 11/29/2024, 9:51 AM  "

## 2024-12-04 ENCOUNTER — Ambulatory Visit

## 2024-12-06 ENCOUNTER — Ambulatory Visit

## 2024-12-06 DIAGNOSIS — C50412 Malignant neoplasm of upper-outer quadrant of left female breast: Secondary | ICD-10-CM

## 2024-12-06 DIAGNOSIS — Z483 Aftercare following surgery for neoplasm: Secondary | ICD-10-CM

## 2024-12-06 DIAGNOSIS — L905 Scar conditions and fibrosis of skin: Secondary | ICD-10-CM

## 2024-12-06 DIAGNOSIS — M25612 Stiffness of left shoulder, not elsewhere classified: Secondary | ICD-10-CM

## 2024-12-06 NOTE — Therapy (Signed)
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 lateral trunk with cocoa butter PROM left shoulder flex, scaption, abd, ER with VC's to relax  11/22/2023 Discussed progress Pulleys 2 min shoulder flexion and abduction Left shoulder abd with pulleys x 2:30 ea 3 D AROM x 5 ea, bilateral flexion, bilateral scaption, bilateral horizontal abd x 5 Cording release left UE STM bilateral UT and Left pectorals, lateral trunk with cocoa butter PROM left shoulder flex, scaption, abd, ER with VC's to relax  Left pec doorway stretch 3 x 20  sec   11/19/2024  Ball rolls on wall x  forward and 5 left abd Left shoulder abd with pulleys x 3 min 3 D AROM x 5 ea,  bilateral flexion, bilateral scaption, bilateral horizontal abd, snow angels x 5 Cording release left UE STM left UT and pectorals, lateral trunk with cocoa butter PROM left shoulder flex, scaption, abd, ER with VC's to relax Measured AROM:True abd Bilaterally about the same.  11/14/2024 Pulleys x 2 min flex and abduction Ball rolls on wall x 5 forward and 5 left abd Supine on half foam roll;alternate shoulder flexion x 5,bilateral scaption, horizontal abd, snow angels x 5 Cording release left UE STM left UT and pectorals, lateral trunk with cocoa butter PROM left shoulder flex, scaption, abd, ER with VC's to relax  11/06/2024 Pulleys x 2 min flex and abduction Ball rolls on wall x 5 forward and 5 left abd 3 D AROM x 5 ea, alternate flexion, bilateral scaption, bilateral horizontal abd, snow angels x 5 Star gazer x 5 Cording release left UE STM left UT and pectorals, lateral trunk with cocoa butter PROM left shoulder flex, scaption, abd, ER with VC's to relax  11/01/2024 Pt educated in exs listed below performing each x 4-5 repetitions to decrease shoulder/chest tightness and cording. TG soft sleeve cut to assist with cording/keeping her arm warm. Discussed POC, LOS, treatment intervetions,No show policy    PATIENT EDUCATION:  Education details: Access Code: [email or web address] URL: [email or web address] Date: 11/01/2024 Prepared by: Grayce Sheldon  Exercises - Supine Shoulder Flexion Extension AAROM with Dowel  - 2 x daily - 7 x weekly - 1 sets - 5 reps - 5 hold - Single Arm Doorway Pec Stretch at 90 Degrees Abduction  - 2 x daily - 7 x weekly - 1 sets - 3 reps - 20 hold Supine stargazer, standing shoulder wall slide for abduction Person educated: Patient Education method: Explanation, Demonstration, and Handouts Education comprehension: verbalized understanding and returned demonstration  HOME EXERCISE PROGRAM: - Supine Shoulder Flexion Extension AAROM with Dowel    -  Single Arm Doorway Pec Stretch at 90 Degrees Abduction   Supine stargazer, standing shoulder wall slide for abduction  ASSESSMENT:  CLINICAL IMPRESSION: Left UE noticeably more fatigued with jobes flexion and scaption.   EVAL Patient is a 39 y.o. female who was seen today for physical therapy evaluation and treatment for complaints of left UE tightness/pain/ achiness present since the end of November. She has visible and palpable cording in the axilla, with tightness felt throughout the forearm. Her left pectorals are very tight and her left shoulder ROM is restricted. She does not have any measurable edema today and her SOZO screen was in the green. She was educated in Left shoulder/pec stretches to start for HEP. She will benefit from skilled PT to address cording and ROM deficits to help decrease the achiness/pain in her left UE.   OBJECTIVE IMPAIRMENTS: decreased activity tolerance, decreased knowledge of condition, decreased mobility, decreased ROM, increased fascial restrictions, impaired UE functional use, postural dysfunction, and pain.   ACTIVITY LIMITATIONS: carrying, lifting, and reach over head  PARTICIPATION LIMITATIONS: cleaning, occupation, and church  PERSONAL FACTORS: 3+ comorbidities: neoadjuvant chemo, L.Breast surgery with SLNB,radiation are also affecting patient's functional outcome.   REHAB POTENTIAL: Excellent  CLINICAL DECISION MAKING: Stable/uncomplicated  EVALUATION COMPLEXITY: Low  GOALS: Goals reviewed with patient? Yes  SHORT TERM GOALS=LONG TERM GOALS: Target date: 12/14/2023  Patient will demonstrate independence in HEP to decrease pain and improve ROM  Baseline: Goal status: MET 11/21/2024  2.  Patient will increase left shoulder active abduction to >/= 170 degrees for increased ease reaching  Baseline:  Goal status: INITIAL  3.  Patient will report >/= 50% decrease in pain/tightness for increased tolerance of daily tasks  Baseline:  Goal  status: INITIAL  4.  Quick dash will improve to no greater than 18% to demonstrate improved function   Baseline 59%   Goal status: INITIAL PLAN:  PT FREQUENCY: 2x/week  PT DURATION: 6 weeks  PLANNED INTERVENTIONS: 97164- PT Re-evaluation, 97750- Physical Performance Testing, 97110-Therapeutic exercises, 97530- Therapeutic activity, [id number]- Neuromuscular re-education, 97535- Self Care, [id number]- Manual therapy, 97760- Orthotic Initial, and [id number]- Orthotic/Prosthetic subsequent  PLAN FOR NEXT SESSION: Review HEP, Cording release left UE,  cupping prn,PROM, pec stretches, supine AROM progress to half roll etc, MLD prn  Grayce JINNY Sheldon, PT 12/06/2024, 9:08 AM  "

## 2024-12-07 ENCOUNTER — Other Ambulatory Visit: Payer: Self-pay

## 2024-12-07 ENCOUNTER — Ambulatory Visit: Admitting: Internal Medicine

## 2024-12-07 VITALS — BP 120/70 | HR 78 | Temp 98.3°F | Resp 18 | Ht 62.25 in | Wt 202.8 lb

## 2024-12-07 DIAGNOSIS — L439 Lichen planus, unspecified: Secondary | ICD-10-CM

## 2024-12-07 DIAGNOSIS — Z862 Personal history of diseases of the blood and blood-forming organs and certain disorders involving the immune mechanism: Secondary | ICD-10-CM | POA: Diagnosis not present

## 2024-12-07 DIAGNOSIS — H44113 Panuveitis, bilateral: Secondary | ICD-10-CM | POA: Diagnosis not present

## 2024-12-07 DIAGNOSIS — J301 Allergic rhinitis due to pollen: Secondary | ICD-10-CM

## 2024-12-07 DIAGNOSIS — K219 Gastro-esophageal reflux disease without esophagitis: Secondary | ICD-10-CM | POA: Diagnosis not present

## 2024-12-07 DIAGNOSIS — J0181 Other acute recurrent sinusitis: Secondary | ICD-10-CM | POA: Diagnosis not present

## 2024-12-07 DIAGNOSIS — J4550 Severe persistent asthma, uncomplicated: Secondary | ICD-10-CM

## 2024-12-07 MED ORDER — TRELEGY ELLIPTA 200-62.5-25 MCG/ACT IN AEPB: 1.0000 | INHALATION_SPRAY | Freq: Every day | RESPIRATORY_TRACT | 6 refills | Status: AC

## 2024-12-07 MED ORDER — CLOBETASOL PROPIONATE 0.05 % EX OINT: 1.0000 | TOPICAL_OINTMENT | Freq: Two times a day (BID) | CUTANEOUS | 0 refills | Status: AC

## 2024-12-07 MED ORDER — AZELASTINE HCL 0.1 % NA SOLN: 2.0000 | Freq: Two times a day (BID) | NASAL | 6 refills | Status: AC

## 2024-12-07 MED ORDER — AMOXICILLIN-POT CLAVULANATE 875-125 MG PO TABS: 1.0000 | ORAL_TABLET | Freq: Two times a day (BID) | ORAL | 0 refills | Status: AC

## 2024-12-07 MED ORDER — FLUTICASONE PROPIONATE 50 MCG/ACT NA SUSP: 2.0000 | Freq: Every day | NASAL | 2 refills | Status: AC

## 2024-12-07 NOTE — Progress Notes (Signed)
 "  Follow Up Note  RE: Jeanne Evans MRN: [id number] DOB: 1986-07-21 Date of Office Visit: 12/07/2024  Referring provider: Armond Cape, MD Primary care provider: Armond Cape, MD  Chief Complaint: Asthma (Patient states that her asthma has been doing well. She states that she has used her rescue inhaler four times in the last two weeks. She states that she has had a productive cough since last week.)  History of Present Illness: I had the pleasure of seeing Jeanne Evans for a follow up visit at the [facility] on 12/07/2024. She is a 39 y.o. female, who is being followed for severe persistent asthma on dupixent , GERD, allergic rhinitis, lichen planus. Her previous allergy office visit was on 06/04/24 with Arlean Mutter, FNP. Today is a regular follow up visit.  History obtained from patient, chart review.  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  History of Present Illness Jeanne Evans is a 39 year old female with severe asthma and immunosuppression who presents with sinus congestion and respiratory symptoms.  Upper respiratory symptoms - Sinus congestion and respiratory symptoms for the past two weeks - Sensation of water-like drainage from nose without mucus - Loss of voice followed by onset of cough - Headache and nasal congestion - Cough improved with use of Sudafed and nasal rinses, which facilitated expectoration of mucus  -no with dry mouth symptoms from sudafed most likely  - Attempted to manage nasal congestion with children's nasal spray due to lack of regular nasal spray  Asthma and respiratory  management - History of severe asthma - Not currently using Trelegy, which was previously used, stopped for unsure reasons  - Stable on dupixent .  No adverse reactions  - Needing albuterol  daily this week due to cough  Immunosuppression and infection precautions - History of immunosuppression - Previously on CellCept, discontinued due to recurrent urinary tract infections requiring hospitalization - Currently only on Humira for immunosuppression - Off azathioprine - Cautious about infections and wears a mask at work - Has not received influenza vaccination this year, but open to it    Dermatologic symptoms - History of lichen planus, previously managed with clobetasol  ointment - Legs previously cleared, now with mild recurrent  - Not currently using any topical steroids, due to running out   GERD - Controlled on omeprazole . Needs refill     Pertinent History/Diagnostics:  - Asthma: severe persistent  -- Biologic Labs 05/2022: AEC 500, IgE 198 without perennial sensitizations  - Dupixent  started 06/2022  -0 ED visits, 0 UC visits and 2 oral steroids in the past year - (3/23) 1 number of lifetime hospitalizations, 0 number of lifetime intubations.  -Obstructive/restrictive defect spirometry (06/04/2022): ratio 65, 1.21 L, 49% FEV1 (pre), + 21% FEV1 (post)  CTPA 3/23: IMPRESSION: 1. No pulmonary embolus. 2. 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 The right perihilar perifissural nodule is smaller than on prior exam. No new or progressive nodularity. 3. Unchanged hilar adenopathy, right greater than left. 4. 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 female with: Other acute recurrent sinusitis  Severe persistent asthma without complication (HCC)  Gastroesophageal reflux disease without esophagitis  Seasonal allergic rhinitis due to pollen  Lichen planus  Panuveitis of both eyes  History of sarcoidosis   Plan: Patient Instructions  Uveitis: concern for sarcoid  - Continue humira and all other prescriptions from optho and rheum - Follow up with ophthalmology as planned  -  Follow up with rheumatology as planned    Severe Asthma: persistent: well controlled, may have a component of pulmonary sarcoid  - Biologic Labs 05/2022: AEC 500, IgE 198 without perennial sensitizations  - Dupixent  started 06/2022  Breathing tests: looked a little down and based on symptoms will treat for upper respiratory infection given immunosuppression     - start augmentin 875/125mg  twice daily for 7 days     - no need for corticosteroids at this time   PLAN:  - Spacer not needed with current regimen. - Daily controller medication(s): RESTART  Trelegy 1 puff daily - samples given today  - Prior to physical activity: albuterol  2 puffs 10-15 minutes before physical activity. 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  - Consider nasal saline rinses 1-2 times daily to remove allergens from the nasal cavities as well as help with mucous clearance (this is especially helpful to do before the nasal sprays are given) -  stop the sudafed this is causing your dry mouth    Lichen planus  -Continue to follow up with dermatology  -Continue Clobetasol  0.5%  ointment twice a day as needed to control symptoms     - refilled today   Follow up: 6 months   Thank you so much for letting me partake in your care today.  Don't hesitate to reach out if you have any additional concerns!  Hargis Springer, MD  [facility] [place] [place] No follow-ups on file.  Meds ordered this encounter  Medications   azelastine  (ASTELIN ) 0.1 % nasal spray    Sig: Place 2 sprays into both nostrils 2 (two) times daily. Use in each nostril as directed    Dispense:  30 mL    Refill:  6   fluticasone  (FLONASE ) 50 MCG/ACT nasal spray    Sig: Place 2 sprays into both nostrils daily.    Dispense:  16 g    Refill:  2   amoxicillin-clavulanate (AUGMENTIN) 875-125 MG tablet  Sig: Take 1 tablet by mouth 2 (two) times daily for 7 days.    Dispense:  14 tablet    Refill:  0   clobetasol  ointment (TEMOVATE ) 0.05 %    Sig: Apply 1 Application topically 2 (two) times daily.    Dispense:  30 g    Refill:  0   Fluticasone -Umeclidin-Vilant (TRELEGY ELLIPTA ) 200-62.5-25 MCG/ACT AEPB    Sig: Inhale 1 puff into the lungs daily.    Dispense:  1 each    Refill:  6    Lab Orders  No laboratory test(s) ordered today   Diagnostics: Spirometry:  Tracings reviewed. Her effort: Good reproducible efforts. FVC: 2.34L FEV1: 1.72L,70% predicted FEV1/FVC ratio: 74% Interpretation: reduced FEV1, normal ratio.   Please see scanned spirometry results for details.  Results interpreted by myself during this encounter and  discussed with patient/family.   Medication List:  Current Outpatient Medications  Medication Sig Dispense Refill   adalimumab (HUMIRA, 2 PEN,) 40 MG/0.4ML pen Inject 40 mg into the skin every 14 (fourteen) days.     albuterol  (VENTOLIN  HFA) 108 (90 Base) MCG/ACT inhaler Inhale 2 puffs into the lungs every 6 (six) hours as needed for wheezing or shortness of breath. 6.7 g 1   amoxicillin-clavulanate (AUGMENTIN) 875-125 MG tablet Take 1 tablet by mouth 2 (two) times daily for 7 days. 14 tablet 0   azaTHIOprine (IMURAN) 50 MG tablet Take 50 mg by mouth daily.     baclofen  (LIORESAL ) 10 MG tablet Take 1 tablet (10 mg total) by mouth 3 (three) times daily. 30 each 0   DUPIXENT  300 MG/2ML prefilled syringe INJECT 1 SYRINGE SUBCUTANEOUSLY  EVERY OTHER WEEK 4 mL 11   ibuprofen  (ADVIL ) 800 MG tablet Take 1 tablet (800 mg total) by mouth 3 (three) times daily. 30 tablet 0   ipratropium (ATROVENT ) 0.06 % nasal spray 1 spray each nostril up to 3 times daily as needed for runny nose. 15 mL 5   levonorgestrel (MIRENA, 52 MG,) 20 MCG/DAY IUD by Intrauterine route.     montelukast  (SINGULAIR ) 10 MG tablet Take 10 mg by mouth as needed.     omeprazole  (PRILOSEC) 40 MG capsule Take 1 capsule (40 mg total) by mouth daily. First thing in the morning on an empty stomach 30 capsule 3   ondansetron  (ZOFRAN -ODT) 4 MG disintegrating tablet Take 1 tablet (4 mg total) by mouth every 8 (eight) hours as needed for nausea or vomiting. 10 tablet 0   oxyCODONE -acetaminophen  (PERCOCET/ROXICET) 5-325 MG tablet Take 1-2 tablets by mouth every 6 (six) hours as needed for severe pain (pain score 7-10). 6 tablet 0   pimecrolimus  (ELIDEL ) 1 % cream Apply topically 2 (two) times daily. 100 g 5   rizatriptan  (MAXALT ) 10 MG tablet Take 1 tablet (10 mg total) by mouth as needed for migraine. May repeat in 2 hours if needed 10 tablet 11   tacrolimus  (PROTOPIC ) 0.1 % ointment Apply topically 2 (two) times daily. 100 g 5   traMADol   (ULTRAM ) 50 MG tablet Take 1 tablet (50 mg total) by mouth every 6 (six) hours as needed. 15 tablet 0   Acetaminophen  325 MG CAPS Take by mouth.     azelastine  (ASTELIN ) 0.1 % nasal spray Place 2 sprays into both nostrils 2 (two) times daily. Use in each nostril as directed 30 mL 6   clobetasol  ointment (TEMOVATE ) 0.05 % Apply 1 Application topically 2 (two) times daily. 30 g 0   fluticasone  (FLONASE ) 50 MCG/ACT nasal  spray Place 2 sprays into both nostrils daily. 16 g 2   Fluticasone -Umeclidin-Vilant (TRELEGY ELLIPTA ) 200-62.5-25 MCG/ACT AEPB Inhale 1 puff into the lungs daily. 1 each 6   Current Facility-Administered Medications  Medication Dose Route Frequency Provider Last Rate Last Admin   dupilumab  (DUPIXENT ) prefilled syringe 300 mg  300 mg Subcutaneous Q14 Days Jeneal Danita Macintosh, MD   300 mg at 11/28/24 9043   Allergies: Allergies  Allergen Reactions   Sulfa Antibiotics Hives   I reviewed her past medical history, social history, family history, and environmental history and no significant changes have been reported from her previous visit.  ROS: All others negative except as noted per HPI.   Objective: BP 120/70 (Cuff Size: Large)   Pulse 78   Temp 98.3 F (36.8 C)   Resp 18   Ht 5' 2.25 (1.581 m)   Wt 202 lb 12.8 oz (92 kg)   SpO2 97%   BMI 36.80 kg/m  Body mass index is 36.8 kg/m. General Appearance:  Alert, cooperative, no distress, appears stated age  Head:  Normocephalic, without obvious abnormality, atraumatic  Eyes:  Erythema of right eye , EOM's intact  Nose: Nares normal, erythematous nasal mucosa, clear rhinorrhea , no visible anterior polyps, and septum midline  Throat: Lips, tongue normal; teeth and gums normal, normal posterior oropharynx and no tonsillar exudate  Neck: Supple, symmetrical  Lungs:   clear to auscultation bilaterally, Respirations unlabored, no coughing  Heart:  regular rate and rhythm and no murmur, Appears well perfused   Extremities: No edema  Skin: Skin color, texture, turgor normal, no rashes on visible portions of skin  Neurologic: No gross deficits   Previous notes and tests were reviewed. The plan was reviewed with the patient/family, and all questions/concerned were addressed.  It was my pleasure to see Elinora today and participate in her care. Please feel free to contact me with any questions or concerns.  Sincerely,  Hargis Springer, MD  Allergy & Immunology  [facility]  [place] Office: [phone] "

## 2024-12-07 NOTE — Patient Instructions (Addendum)
 Uveitis: concern for sarcoid  - Continue humira and all other prescriptions from optho and rheum - Follow up with ophthalmology as planned  -  Follow up with rheumatology as planned    Severe Asthma: persistent: well controlled, may have a component of pulmonary sarcoid  - Biologic Labs 05/2022: AEC 500, IgE 198 without perennial sensitizations  - Dupixent  started 06/2022  Breathing tests: looked a little down and based on symptoms will treat for upper respiratory infection given immunosuppression     - start augmentin  875/125mg  twice daily for 7 days     - no need for corticosteroids at this time   PLAN:  - Spacer not needed with current regimen. - Daily controller medication(s): RESTART  Trelegy 1 puff daily - samples given today  - Prior to physical activity: albuterol  2 puffs 10-15 minutes before physical activity. 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  - Consider nasal saline rinses 1-2 times daily to remove allergens from the nasal cavities as well as help with mucous clearance (this is especially helpful to do before the nasal sprays are given) -  stop the sudafed  this is causing your dry mouth    Lichen planus  -Continue to follow up with dermatology  -Continue Clobetasol  0.5%  ointment twice a day as needed to control symptoms     - refilled today   Follow up: 6 months   Thank you so much for letting me partake in your care today.  Don't hesitate to reach out if you have any additional concerns!  Hargis Springer, MD  [facility] [place]

## 2024-12-10 ENCOUNTER — Ambulatory Visit

## 2024-12-12 ENCOUNTER — Ambulatory Visit

## 2024-12-12 DIAGNOSIS — Z483 Aftercare following surgery for neoplasm: Secondary | ICD-10-CM

## 2024-12-12 DIAGNOSIS — C50412 Malignant neoplasm of upper-outer quadrant of left female breast: Secondary | ICD-10-CM

## 2024-12-12 DIAGNOSIS — M25612 Stiffness of left shoulder, not elsewhere classified: Secondary | ICD-10-CM

## 2024-12-12 DIAGNOSIS — L905 Scar conditions and fibrosis of skin: Secondary | ICD-10-CM

## 2024-12-12 NOTE — Addendum Note (Signed)
 Addended by: CANARY GRAYCE PARAS on: 12/12/2024 11:47 AM   Modules accepted: Orders
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
## 2024-12-13 ENCOUNTER — Ambulatory Visit

## 2024-12-13 DIAGNOSIS — Z483 Aftercare following surgery for neoplasm: Secondary | ICD-10-CM

## 2024-12-13 DIAGNOSIS — C50412 Malignant neoplasm of upper-outer quadrant of left female breast: Secondary | ICD-10-CM | POA: Diagnosis not present

## 2024-12-13 DIAGNOSIS — M25612 Stiffness of left shoulder, not elsewhere classified: Secondary | ICD-10-CM

## 2024-12-13 DIAGNOSIS — L7682 Other postprocedural complications of skin and subcutaneous tissue: Secondary | ICD-10-CM
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
## 2024-12-18 ENCOUNTER — Ambulatory Visit

## 2024-12-19 ENCOUNTER — Ambulatory Visit

## 2024-12-19 DIAGNOSIS — M25612 Stiffness of left shoulder, not elsewhere classified: Secondary | ICD-10-CM

## 2024-12-19 DIAGNOSIS — L905 Scar conditions and fibrosis of skin: Secondary | ICD-10-CM

## 2024-12-19 DIAGNOSIS — Z483 Aftercare following surgery for neoplasm: Secondary | ICD-10-CM

## 2024-12-19 DIAGNOSIS — C50412 Malignant neoplasm of upper-outer quadrant of left female breast: Secondary | ICD-10-CM
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
## 2024-12-20 ENCOUNTER — Ambulatory Visit

## 2024-12-20 DIAGNOSIS — J4550 Severe persistent asthma, uncomplicated: Secondary | ICD-10-CM

## 2024-12-24 ENCOUNTER — Ambulatory Visit

## 2024-12-26 ENCOUNTER — Ambulatory Visit

## 2024-12-31 ENCOUNTER — Ambulatory Visit

## 2025-01-02 ENCOUNTER — Ambulatory Visit

## 2025-01-03 ENCOUNTER — Ambulatory Visit

## 2025-01-04 ENCOUNTER — Ambulatory Visit: Admitting: Hematology and Oncology

## 2025-01-07 ENCOUNTER — Ambulatory Visit

## 2025-01-09 ENCOUNTER — Ambulatory Visit

## 2025-04-30 ENCOUNTER — Inpatient Hospital Stay: Admitting: Hematology and Oncology

## 2025-05-13 ENCOUNTER — Encounter

## 2025-06-07 ENCOUNTER — Ambulatory Visit: Admitting: Internal Medicine
